# Patient Record
Sex: Male | Born: 1965
Health system: Southern US, Community
[De-identification: ages and names within clinical notes are randomized; demographics above are authoritative.]

## PROBLEM LIST (undated history)

## (undated) DIAGNOSIS — F1010 Alcohol abuse, uncomplicated: Secondary | ICD-10-CM

## (undated) DIAGNOSIS — J9601 Acute respiratory failure with hypoxia: Secondary | ICD-10-CM

## (undated) DIAGNOSIS — I480 Paroxysmal atrial fibrillation: Secondary | ICD-10-CM

## (undated) DIAGNOSIS — M792 Neuralgia and neuritis, unspecified: Secondary | ICD-10-CM

## (undated) DIAGNOSIS — Z98811 Dental restoration status: Secondary | ICD-10-CM

## (undated) DIAGNOSIS — F1190 Opioid use, unspecified, uncomplicated: Secondary | ICD-10-CM

## (undated) DIAGNOSIS — M5136 Other intervertebral disc degeneration, lumbar region: Secondary | ICD-10-CM

## (undated) DIAGNOSIS — F1290 Cannabis use, unspecified, uncomplicated: Secondary | ICD-10-CM

## (undated) DIAGNOSIS — E7404 McArdle disease: Secondary | ICD-10-CM

## (undated) DIAGNOSIS — G7289 Other specified myopathies: Secondary | ICD-10-CM

## (undated) DIAGNOSIS — I712 Thoracic aortic aneurysm, without rupture, unspecified: Secondary | ICD-10-CM

## (undated) DIAGNOSIS — J90 Pleural effusion, not elsewhere classified: Secondary | ICD-10-CM

## (undated) DIAGNOSIS — M6250 Muscle wasting and atrophy, not elsewhere classified, unspecified site: Secondary | ICD-10-CM

## (undated) DIAGNOSIS — Z87442 Personal history of urinary calculi: Secondary | ICD-10-CM

## (undated) DIAGNOSIS — I10 Essential (primary) hypertension: Secondary | ICD-10-CM

## (undated) DIAGNOSIS — M6281 Muscle weakness (generalized): Secondary | ICD-10-CM

## (undated) DIAGNOSIS — M51369 Other intervertebral disc degeneration, lumbar region without mention of lumbar back pain or lower extremity pain: Secondary | ICD-10-CM

## (undated) HISTORY — DX: Acute respiratory failure with hypoxia: J96.01

## (undated) HISTORY — DX: Neuralgia and neuritis, unspecified: M79.2

## (undated) HISTORY — DX: Paroxysmal atrial fibrillation: I48.0

## (undated) HISTORY — DX: Pleural effusion, not elsewhere classified: J90

## (undated) HISTORY — PX: NASAL FRACTURE SURGERY: SHX718

---

## 1998-02-18 ENCOUNTER — Emergency Department (HOSPITAL_COMMUNITY): Admission: EM | Admit: 1998-02-18 | Discharge: 1998-02-18 | Payer: Self-pay | Admitting: Emergency Medicine

## 1999-03-23 ENCOUNTER — Other Ambulatory Visit (HOSPITAL_COMMUNITY): Admission: RE | Admit: 1999-03-23 | Discharge: 1999-03-28 | Payer: Self-pay | Admitting: Psychiatry

## 2001-01-15 ENCOUNTER — Encounter: Payer: Self-pay | Admitting: Emergency Medicine

## 2001-01-15 ENCOUNTER — Emergency Department (HOSPITAL_COMMUNITY): Admission: EM | Admit: 2001-01-15 | Discharge: 2001-01-15 | Payer: Self-pay | Admitting: Emergency Medicine

## 2003-01-27 ENCOUNTER — Encounter: Payer: Self-pay | Admitting: Emergency Medicine

## 2003-01-27 ENCOUNTER — Emergency Department (HOSPITAL_COMMUNITY): Admission: EM | Admit: 2003-01-27 | Discharge: 2003-01-27 | Payer: Self-pay | Admitting: Emergency Medicine

## 2006-05-07 ENCOUNTER — Emergency Department (HOSPITAL_COMMUNITY): Admission: EM | Admit: 2006-05-07 | Discharge: 2006-05-07 | Payer: Self-pay | Admitting: Emergency Medicine

## 2006-05-27 ENCOUNTER — Emergency Department (HOSPITAL_COMMUNITY): Admission: EM | Admit: 2006-05-27 | Discharge: 2006-05-27 | Payer: Self-pay | Admitting: Family Medicine

## 2007-06-10 ENCOUNTER — Ambulatory Visit: Payer: Self-pay | Admitting: Family Medicine

## 2007-06-10 DIAGNOSIS — R945 Abnormal results of liver function studies: Secondary | ICD-10-CM

## 2007-06-10 LAB — CONVERTED CEMR LAB
ALT: 190 units/L — ABNORMAL HIGH (ref 0–53)
AST: 286 units/L — ABNORMAL HIGH (ref 0–37)
Albumin: 4.5 g/dL (ref 3.5–5.2)
Alkaline Phosphatase: 58 units/L (ref 39–117)
Bilirubin, Direct: 0.1 mg/dL (ref 0.0–0.3)
HCV Ab: NEGATIVE
Hep A IgM: NEGATIVE
Hep B C IgM: NEGATIVE
Hepatitis B Surface Ag: NEGATIVE
Total Bilirubin: 0.9 mg/dL (ref 0.3–1.2)
Total Protein: 7.1 g/dL (ref 6.0–8.3)

## 2007-06-12 ENCOUNTER — Telehealth (INDEPENDENT_AMBULATORY_CARE_PROVIDER_SITE_OTHER): Payer: Self-pay | Admitting: *Deleted

## 2007-08-25 ENCOUNTER — Emergency Department (HOSPITAL_COMMUNITY): Admission: EM | Admit: 2007-08-25 | Discharge: 2007-08-25 | Payer: Self-pay | Admitting: Family Medicine

## 2008-09-06 ENCOUNTER — Encounter: Admission: RE | Admit: 2008-09-06 | Discharge: 2008-09-06 | Payer: Self-pay | Admitting: Internal Medicine

## 2008-09-06 IMAGING — CT CT ABDOMEN W/ CM
3 of 5 series · 14 of 32 positions shown, 19 images · IV contrast (30CC OMNI 350 & [ID] OMNI 300)
Comparison: None

CLINICAL DATA: Abnormal liver function tests.

CT ABDOMEN WITH CONTRAST
TECHNIQUE: Multidetector CT imaging of the abdomen was performed
following the standard protocol during bolus administration of
intravenous contrast.
Contrast: 125 ml [0B]

[Series 2: abdomen w/ · axial · 0.78mm/px · z∈[-170,+10]mm · 4 of 61 slices shown, 9 images]
[im 13/61  soft-tissue]
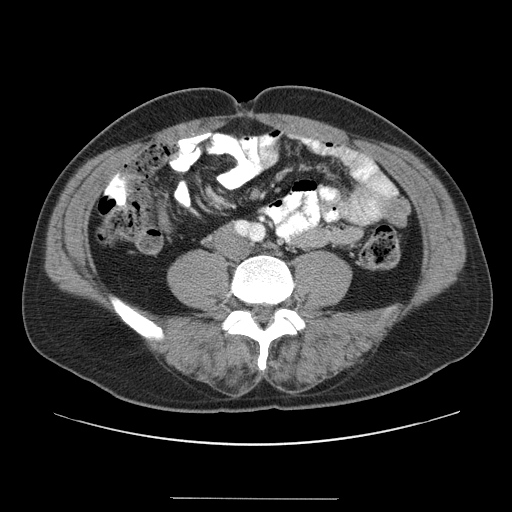
[im 13/61  lung]
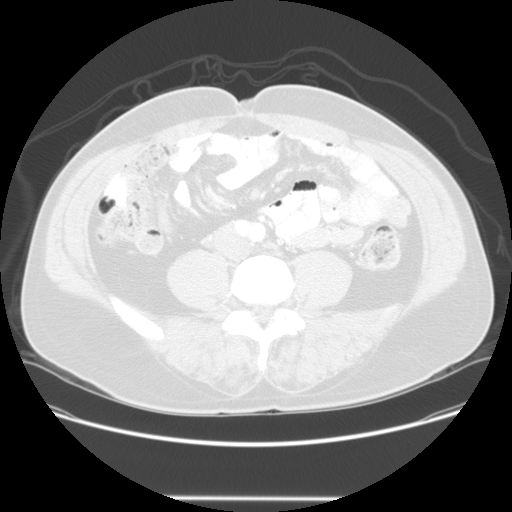
[im 13/61  bone]
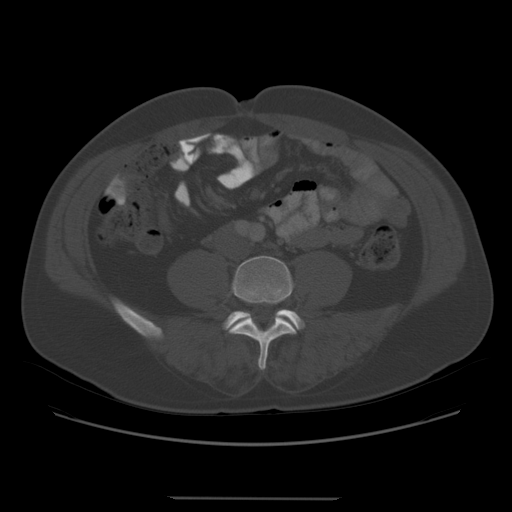
[im 25/61  soft-tissue]
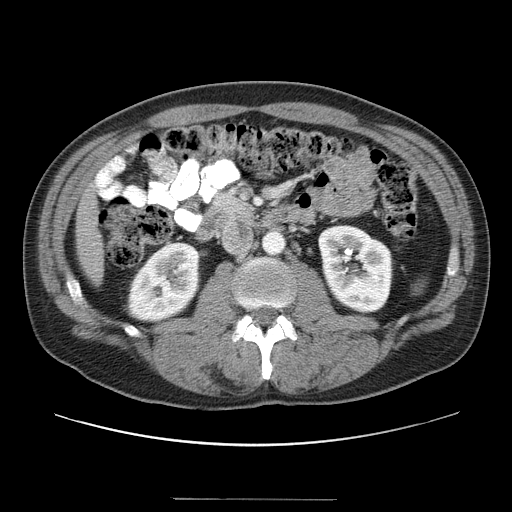
[im 25/61  lung]
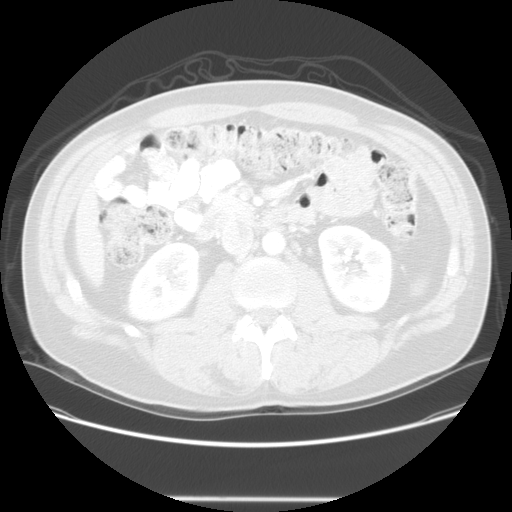
[im 37/61  soft-tissue]
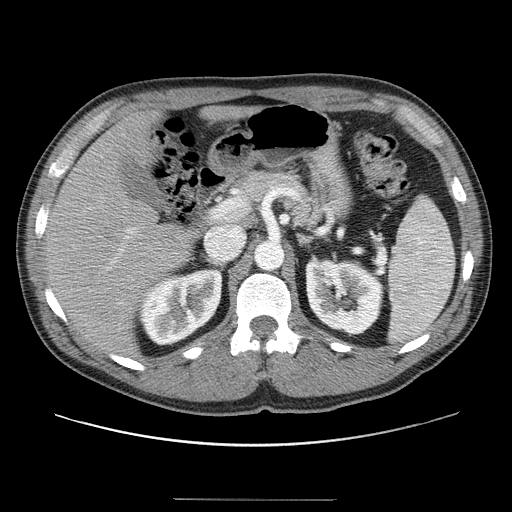
[im 37/61  lung]
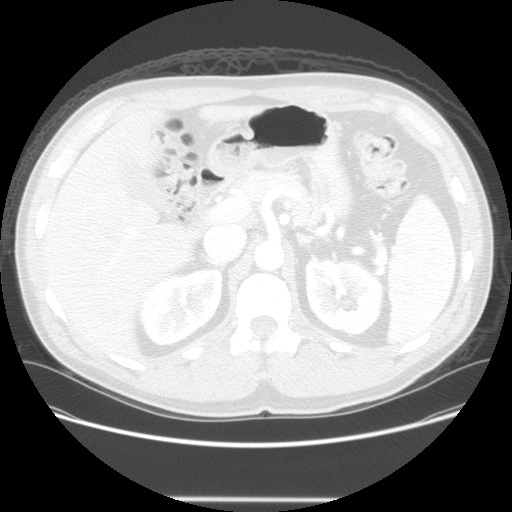
[im 49/61  soft-tissue]
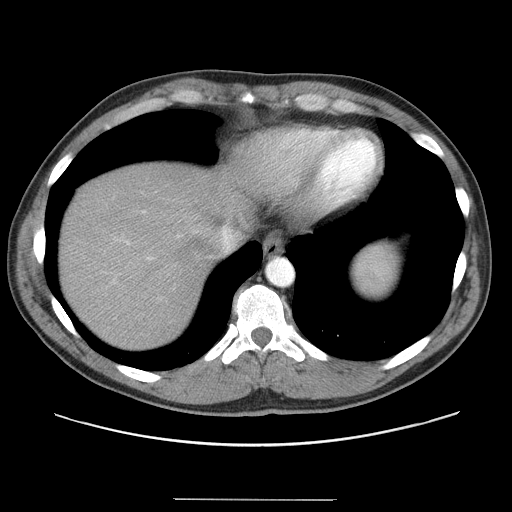
[im 49/61  lung]
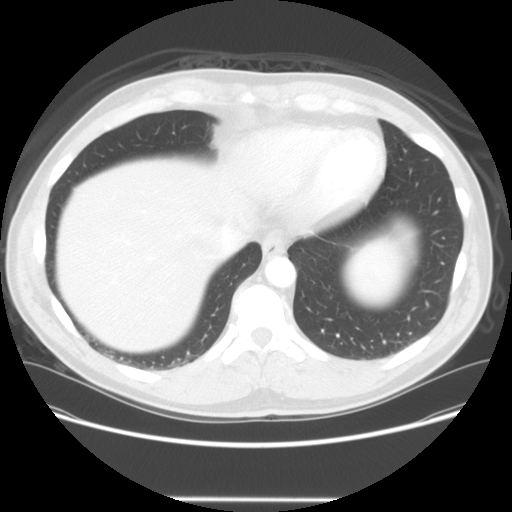

[Series 400: coronal · coronal · 0.78mm/px · 2 of 106 slices shown]
[im 14/106  soft-tissue]
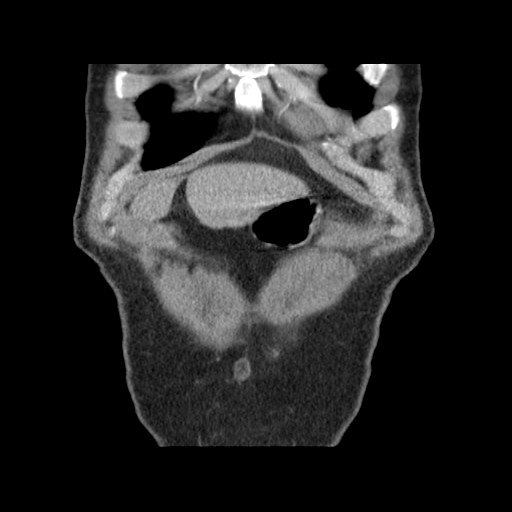
[im 27/106  soft-tissue]
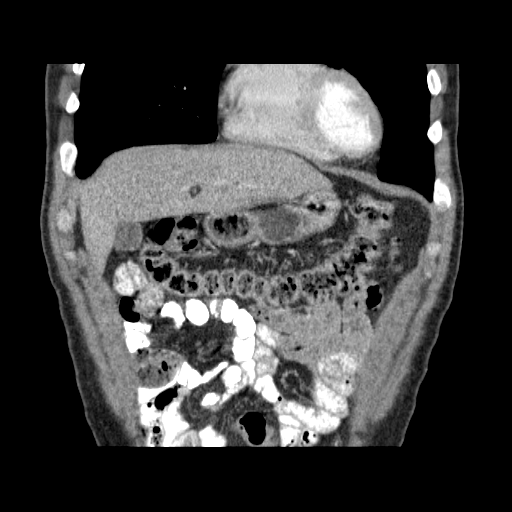

[Series 401: sagittal · sagittal · 0.78mm/px · 8 of 155 slices shown]
[im 13/155  soft-tissue]
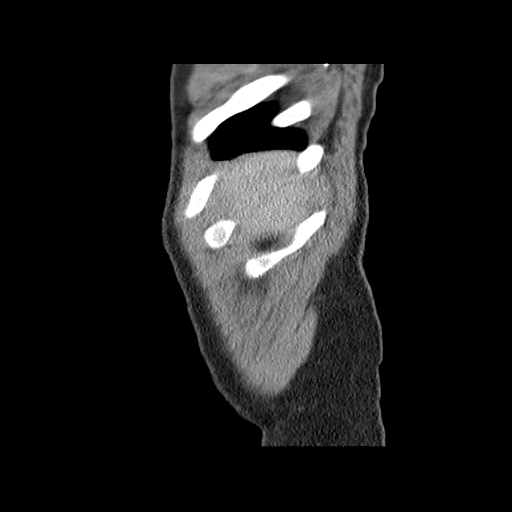
[im 39/155  soft-tissue]
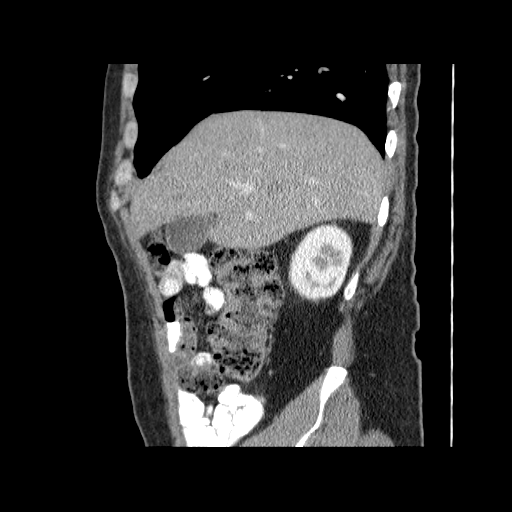
[im 52/155  soft-tissue]
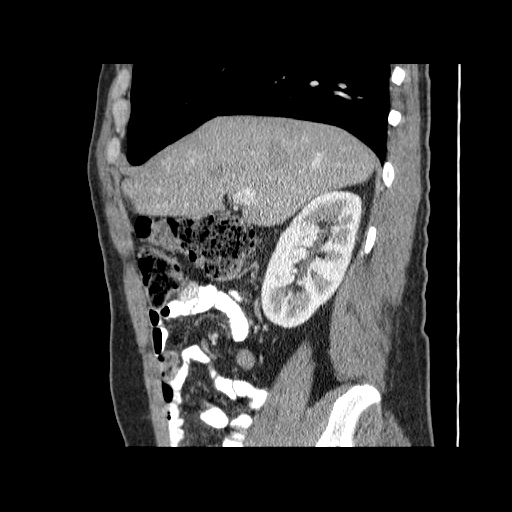
[im 65/155  soft-tissue]
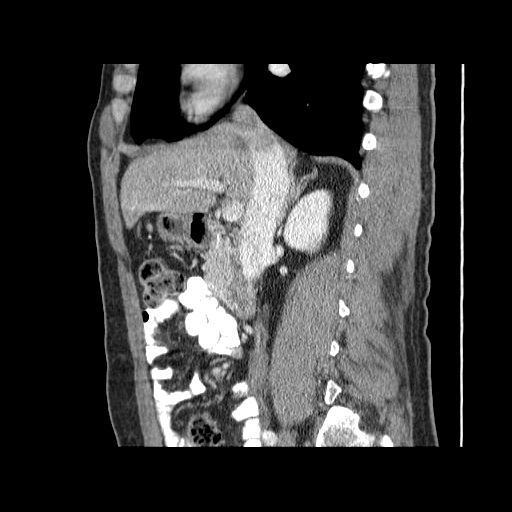
[im 90/155  soft-tissue]
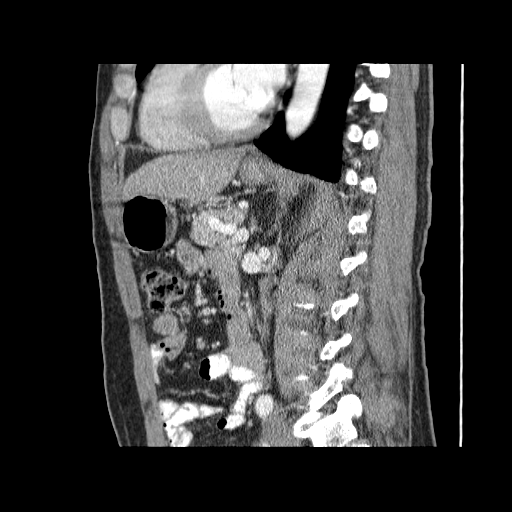
[im 103/155  soft-tissue]
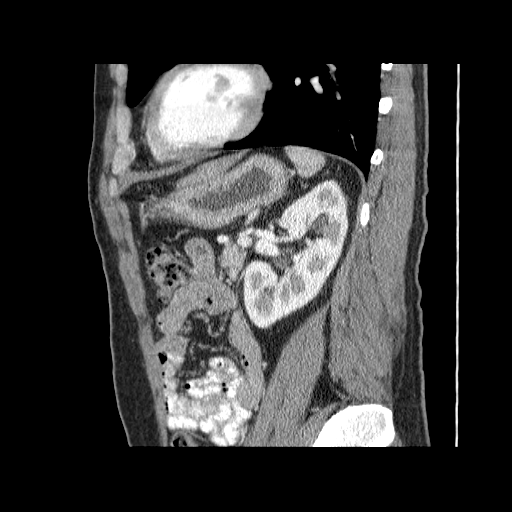
[im 116/155  soft-tissue]
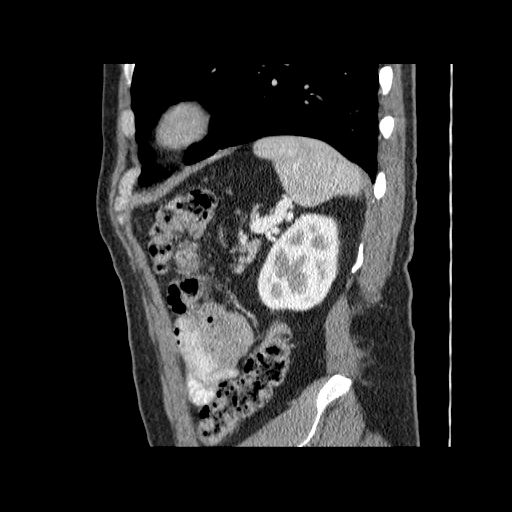
[im 142/155  soft-tissue]
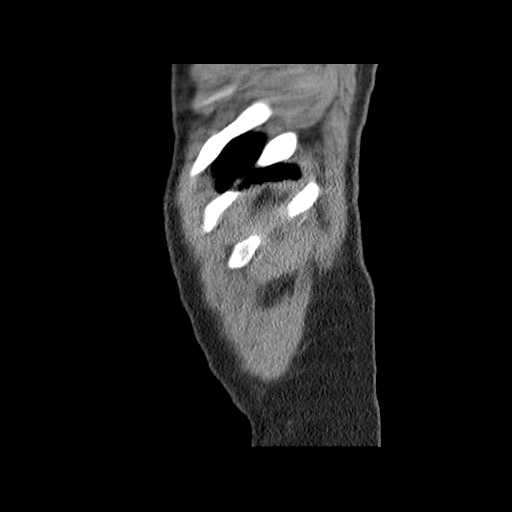

[14 of 32 positions shown; findings below may reference images not displayed]

FINDINGS: Lung bases show minimal dependent atelectasis.  Heart
size normal.  No pericardial or pleural effusion.

Liver, gallbladder, adrenal glands and right kidney are
unremarkable.  A 6 mm stone is seen in the lower pole of the left
kidney.  Spleen, pancreas, stomach and visualized bowel are
unremarkable.  No pathologically enlarged lymph nodes.  No free
fluid.  No worrisome lytic or sclerotic lesions. Advanced
degenerative disc disease is seen at L5-S1.
IMPRESSION: 1.  No acute findings in the abdomen.
2.  Nonobstructing left nephrolithiasis.
3.  Advanced degenerative disc disease at L5-S1.

## 2009-12-27 ENCOUNTER — Emergency Department (HOSPITAL_COMMUNITY): Admission: EM | Admit: 2009-12-27 | Discharge: 2009-12-27 | Payer: Self-pay | Admitting: Family Medicine

## 2011-03-30 LAB — POCT URINALYSIS DIP (DEVICE)
Bilirubin Urine: NEGATIVE
Glucose, UA: NEGATIVE
Hgb urine dipstick: NEGATIVE
Ketones, ur: NEGATIVE
Nitrite: NEGATIVE
Operator id: 116391
Protein, ur: NEGATIVE
Specific Gravity, Urine: 1.015
Urobilinogen, UA: 0.2
pH: 7

## 2011-09-24 ENCOUNTER — Other Ambulatory Visit (HOSPITAL_COMMUNITY): Payer: Self-pay | Admitting: Internal Medicine

## 2011-09-24 ENCOUNTER — Ambulatory Visit (HOSPITAL_COMMUNITY)
Admission: RE | Admit: 2011-09-24 | Discharge: 2011-09-24 | Disposition: A | Discharge status: Home / Self Care | Payer: 59 | Source: Ambulatory Visit | Attending: Internal Medicine | Admitting: Internal Medicine

## 2011-09-24 DIAGNOSIS — M546 Pain in thoracic spine: Secondary | ICD-10-CM | POA: Insufficient documentation

## 2011-09-24 DIAGNOSIS — R52 Pain, unspecified: Secondary | ICD-10-CM | POA: Insufficient documentation

## 2011-09-24 IMAGING — CR DG SCAPULA*R*
2 series · 2 of 2 positions shown · non-contrast
Comparison: None.

CLINICAL DATA: Right upper back pain.

RIGHT SCAPULA - 2+ VIEWS

[w scapula lat right (1 of 2)]
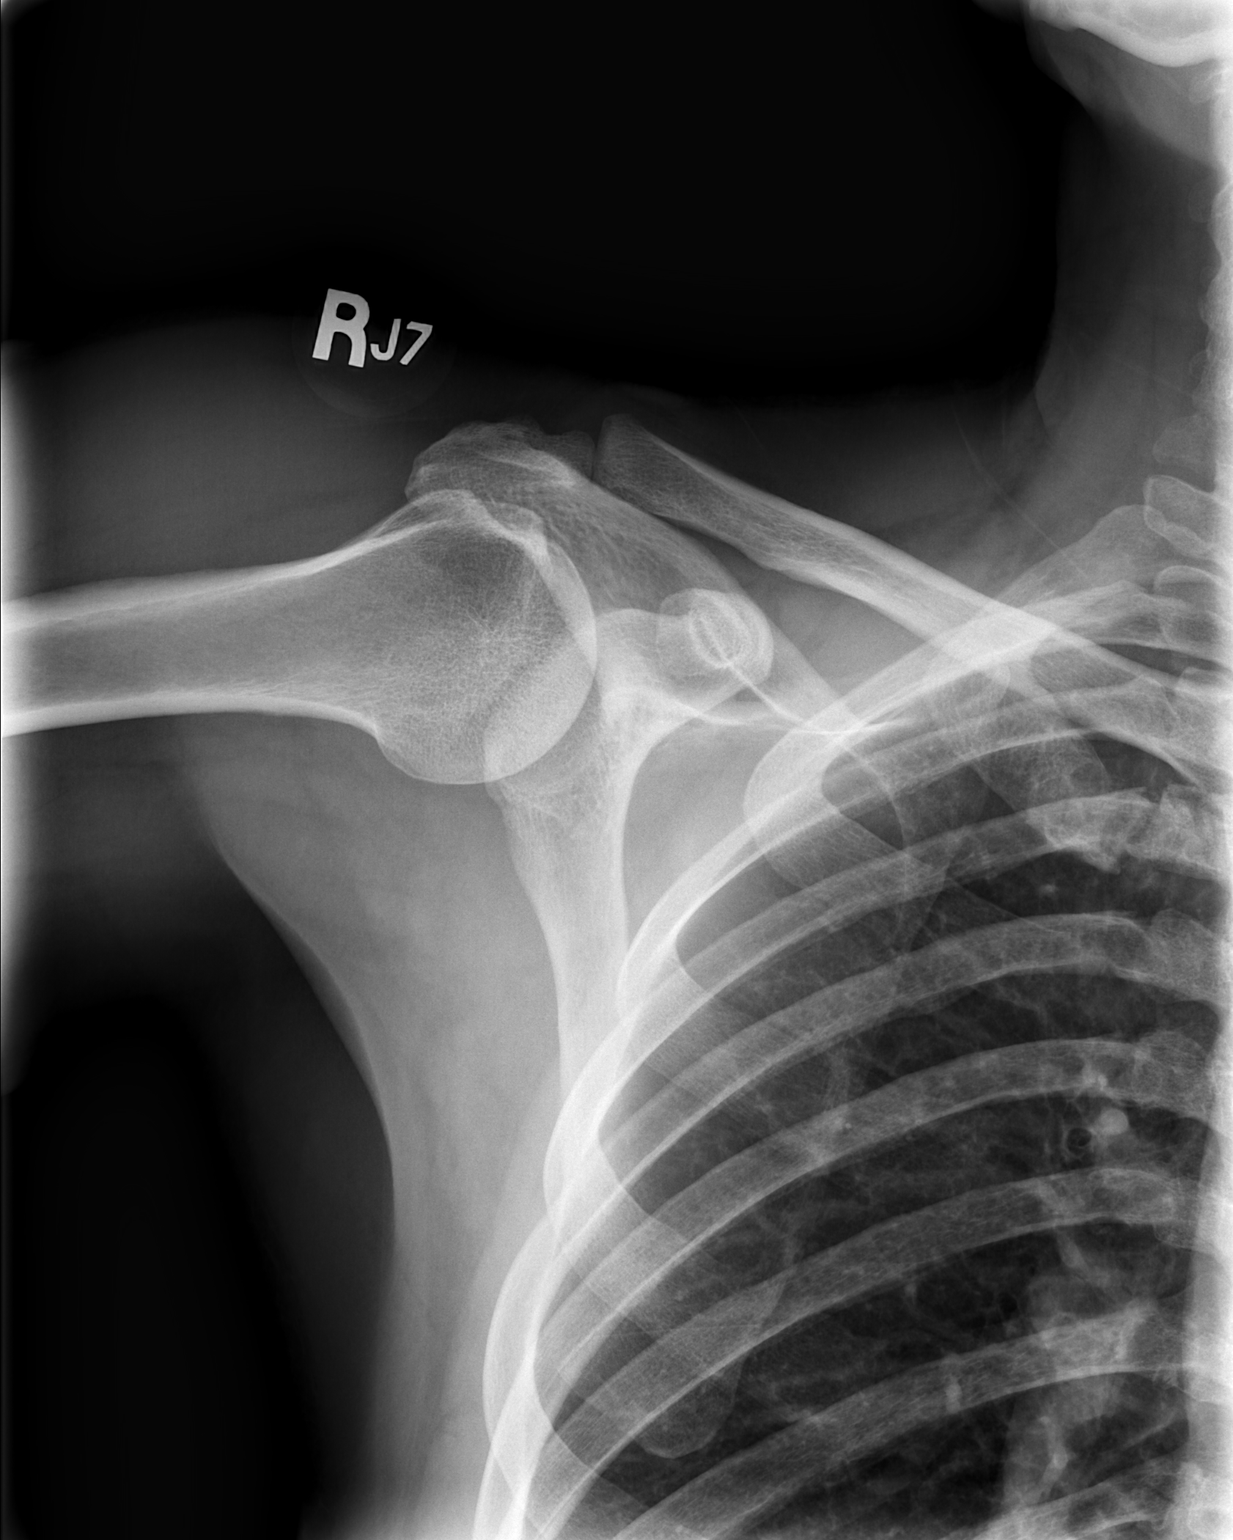

[w scapula lat right (2 of 2)]
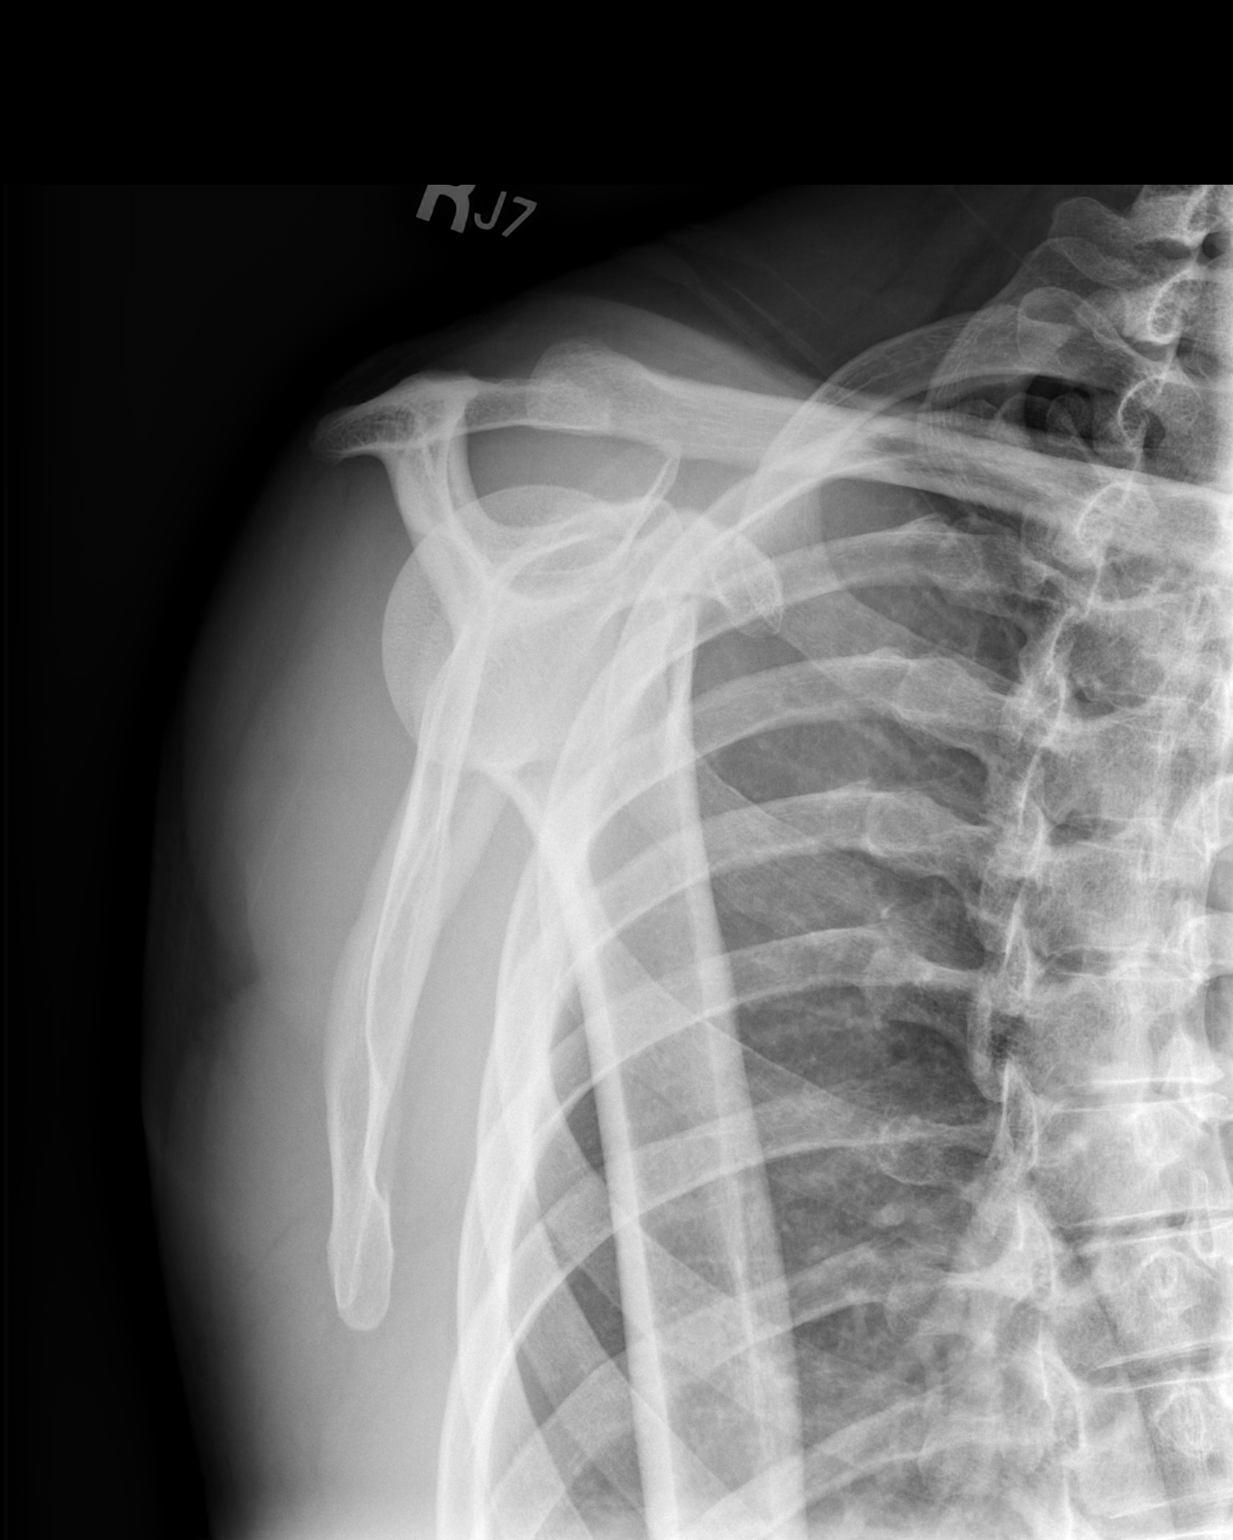

[2 of 2 positions shown; findings below may reference images not displayed]

FINDINGS: Two views of the right scapula show no acute findings.
Visualized portion of the right chest is unremarkable.
IMPRESSION: No acute findings.

## 2011-12-06 ENCOUNTER — Emergency Department (HOSPITAL_COMMUNITY)
Admission: EM | Admit: 2011-12-06 | Discharge: 2011-12-06 | Disposition: A | Discharge status: Home / Self Care | Payer: 59 | Source: Home / Self Care | Attending: Emergency Medicine | Admitting: Emergency Medicine

## 2011-12-06 ENCOUNTER — Encounter (HOSPITAL_COMMUNITY): Payer: Self-pay | Admitting: Cardiology

## 2011-12-06 ENCOUNTER — Emergency Department (INDEPENDENT_AMBULATORY_CARE_PROVIDER_SITE_OTHER): Payer: 59

## 2011-12-06 DIAGNOSIS — M5430 Sciatica, unspecified side: Secondary | ICD-10-CM

## 2011-12-06 DIAGNOSIS — S300XXA Contusion of lower back and pelvis, initial encounter: Secondary | ICD-10-CM

## 2011-12-06 DIAGNOSIS — M5431 Sciatica, right side: Secondary | ICD-10-CM

## 2011-12-06 IMAGING — CR DG LUMBAR SPINE COMPLETE 4+V
5 series · 5 of 5 positions shown · non-contrast
Comparison: [DATE] CT

CLINICAL DATA: Fall, back pain

LUMBAR SPINE - COMPLETE 4+ VIEW

[view not recorded (1 of 5)]
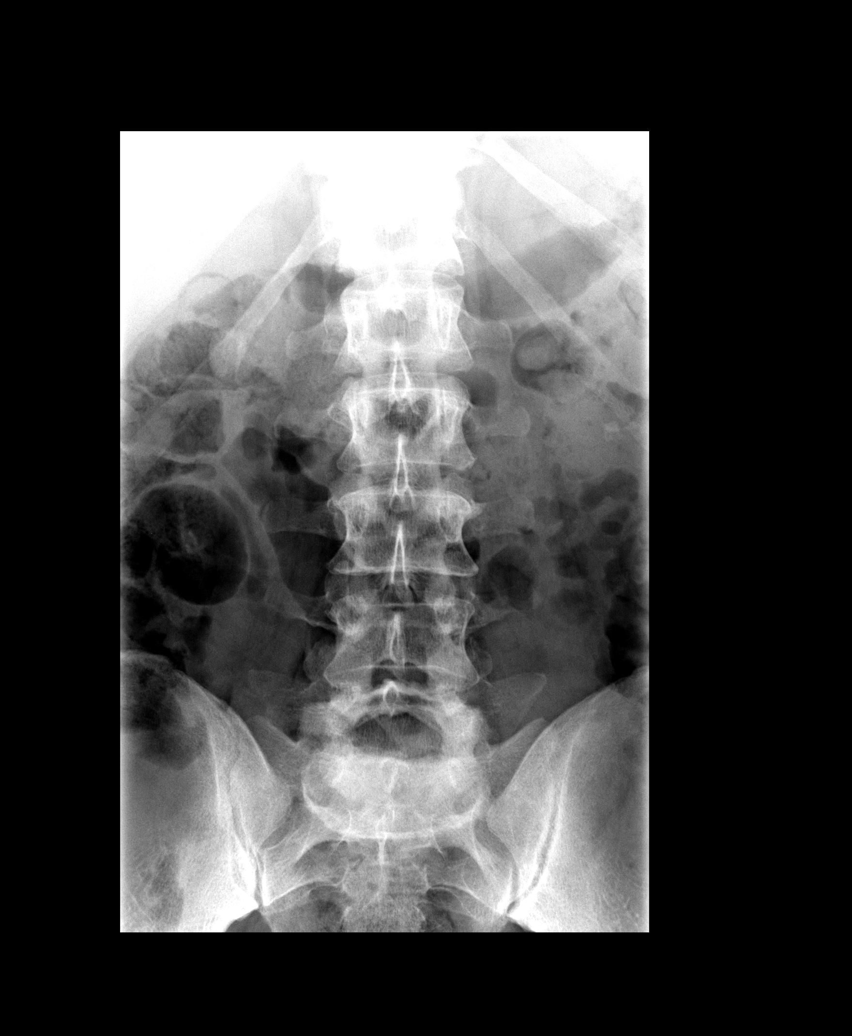

[view not recorded (2 of 5)]
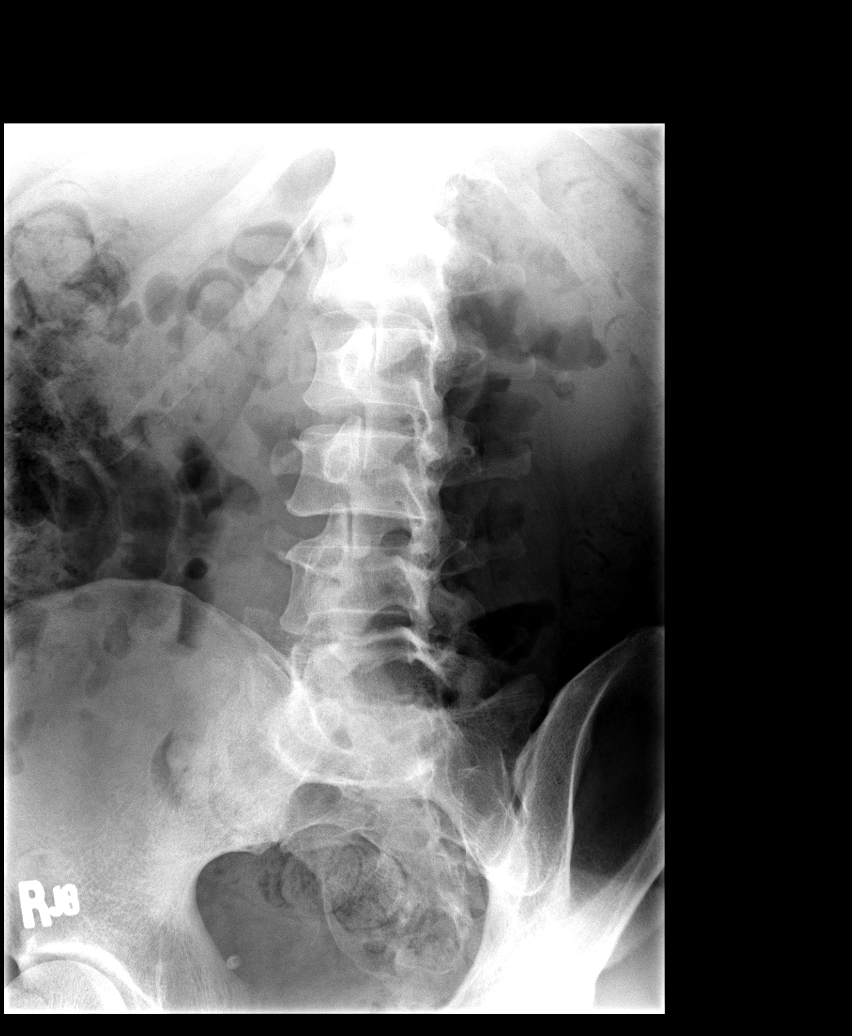

[view not recorded (3 of 5)]
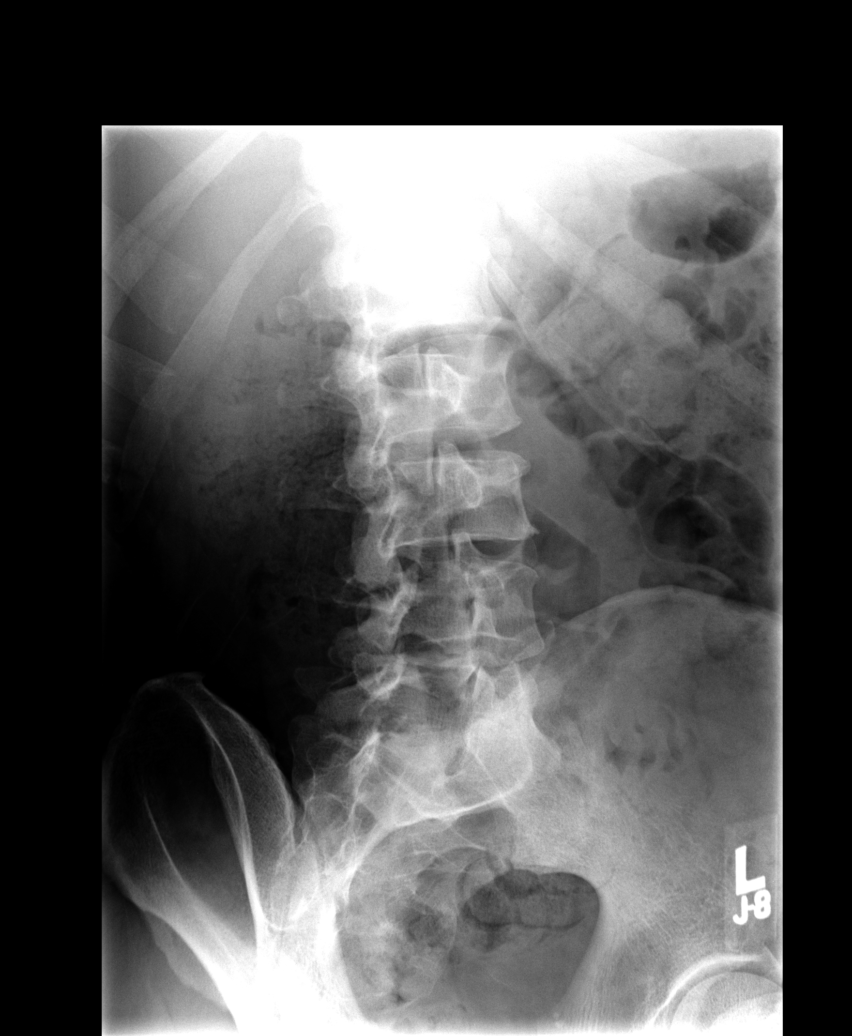

[view not recorded (4 of 5)]
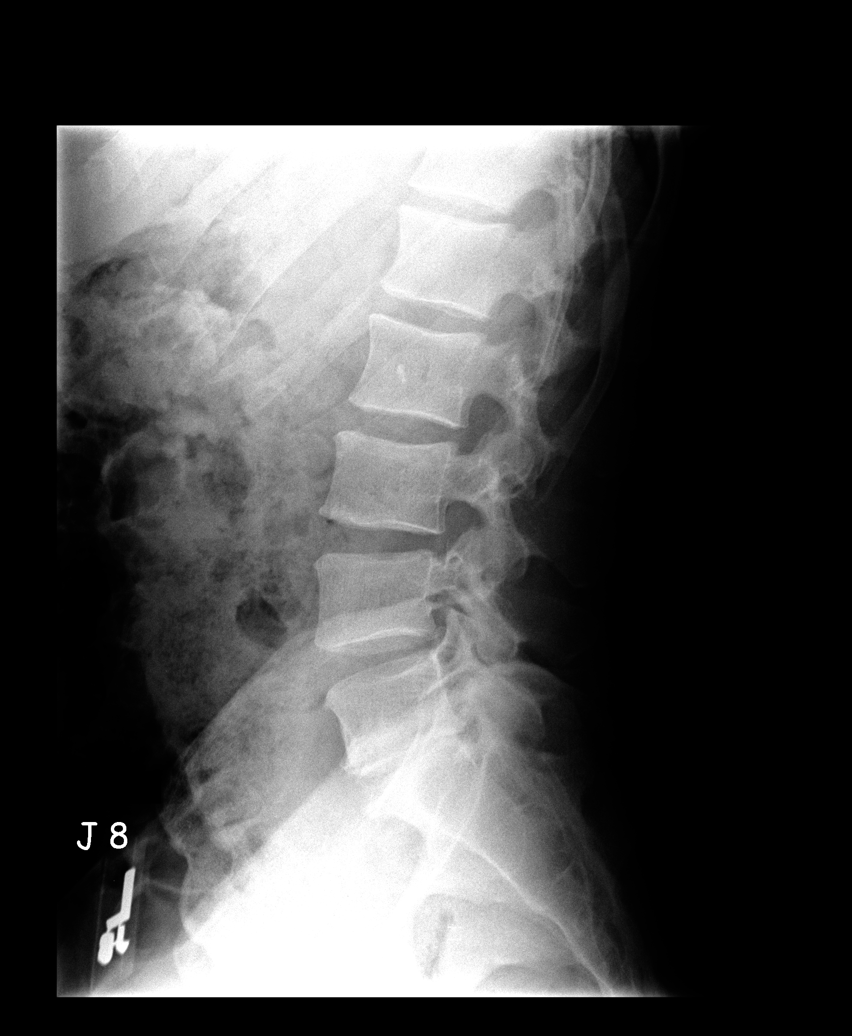

[view not recorded (5 of 5)]
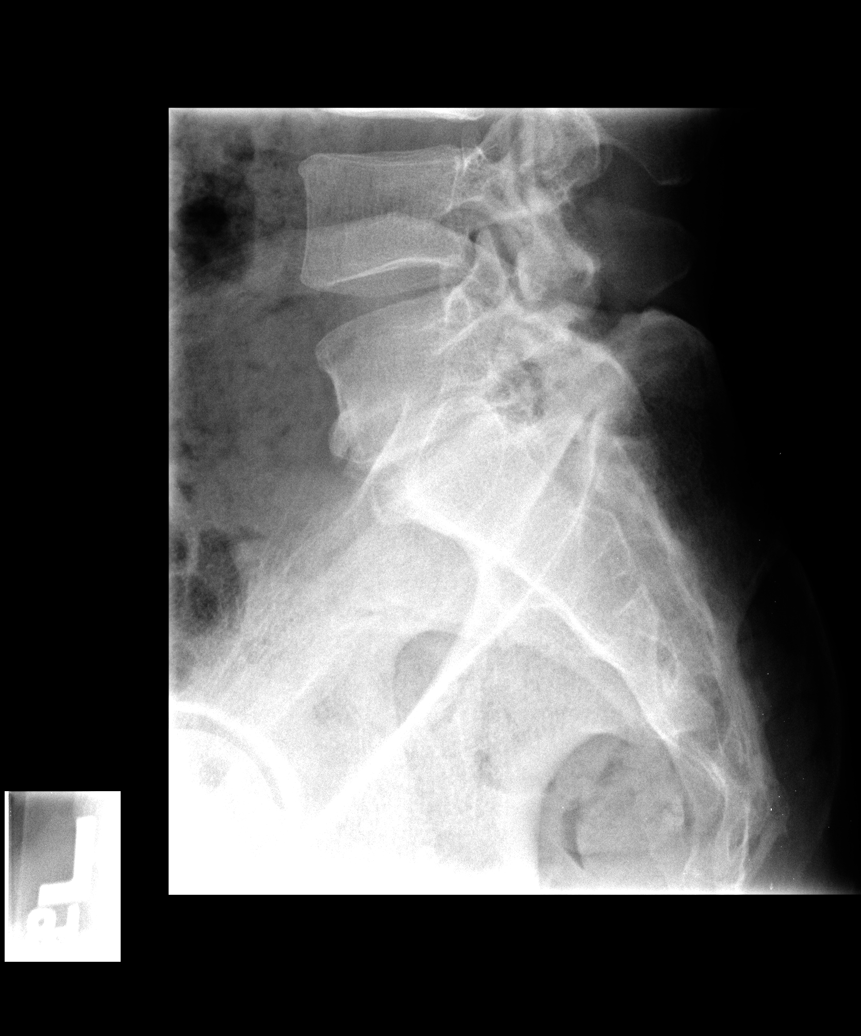

[5 of 5 positions shown; findings below may reference images not displayed]

FINDINGS: Normal lumbar spine alignment.  No compression fracture.
L5 S1 degenerative disc disease and spondylosis evident with disc
space narrowing, sclerosis and bony spurring.  Preserved vertebral
body heights.  No pars defects.  Left lower pole renal calculus
noted measuring 6 mm.
IMPRESSION: No acute finding.

L5 S1 degenerative disc disease and spondylosis

Left inferior pole nephrolithiasis

## 2011-12-06 MED ORDER — PREDNISONE 20 MG PO TABS
ORAL_TABLET | ORAL | Status: AC
  Filled 2011-12-06: qty 3

## 2011-12-06 MED ORDER — KETOROLAC TROMETHAMINE 30 MG/ML IJ SOLN
60.0000 mg | Freq: Once | INTRAMUSCULAR | Status: AC
  Administered 2011-12-06: 60 mg via INTRAMUSCULAR

## 2011-12-06 MED ORDER — OXYCODONE-ACETAMINOPHEN 5-325 MG PO TABS: 1.0000 | ORAL_TABLET | Freq: Four times a day (QID) | ORAL | Status: AC | PRN

## 2011-12-06 MED ORDER — KETOROLAC TROMETHAMINE 10 MG PO TABS: 10.0000 mg | ORAL_TABLET | Freq: Four times a day (QID) | ORAL | Status: AC | PRN

## 2011-12-06 MED ORDER — PREDNISONE 20 MG PO TABS: ORAL_TABLET | ORAL | Status: AC

## 2011-12-06 MED ORDER — KETOROLAC TROMETHAMINE 60 MG/2ML IM SOLN
INTRAMUSCULAR | Status: AC
  Filled 2011-12-06: qty 2

## 2011-12-06 MED ORDER — PREDNISONE 20 MG PO TABS
60.0000 mg | ORAL_TABLET | Freq: Once | ORAL | Status: AC
  Administered 2011-12-06: 60 mg via ORAL

## 2011-12-06 NOTE — ED Provider Notes (Signed)
History     CSN: [id number]  Arrival date & time 12/06/11  1229   First MD Initiated Contact with Patient 12/06/11 1256      Chief Complaint  Patient presents with  . Back Pain    (Consider location/radiation/quality/duration/timing/severity/associated sxs/prior treatment) HPI Comments: Patient states that he was walking backwards while carrying something, lost his balance, fell backwards, hitting the right SI joint on the bumper of the truck. Now reports pain, tingling, burning sensation from the area, extending to the back of his leg to the bottom of his foot. Pain is worse with walking, movement. No alleviating factors. He  Has been taking 1 g of Tylenol 4 times/day without relief. No history of injury to his back, pelvis, or leg. No history of prolonged steroid use, IVDU, cancer, osteoporosis.  Patient is a 46 y.o. male presenting with back pain and leg pain. The history is provided by the patient. No language interpreter was used.  Back Pain  Associated symptoms include leg pain, paresthesias and tingling. Pertinent negatives include no fever, no numbness, no abdominal pain, no abdominal swelling, no bowel incontinence, no bladder incontinence, no dysuria, no pelvic pain, no paresis and no weakness.  Leg Pain  The incident occurred yesterday. The injury mechanism was a fall. The pain is present in the right hip. The quality of the pain is described as burning and tingling. The pain has been constant since onset. Associated symptoms include tingling. Pertinent negatives include no numbness, no inability to bear weight, no loss of motion, no muscle weakness and no loss of sensation. He reports no foreign bodies present. The symptoms are aggravated by palpation. He has tried acetaminophen for the symptoms. The treatment provided no relief.    Past Medical History  Diagnosis Date  . Kidney stones     Past Surgical History  Procedure Date  . Rhinoplasty     History reviewed. No  pertinent family history.  History  Substance Use Topics  . Smoking status: Never Smoker   . Smokeless tobacco: Not on file  . Alcohol Use: No      Review of Systems  Constitutional: Negative for fever.  Gastrointestinal: Negative for abdominal pain and bowel incontinence.  Genitourinary: Negative for bladder incontinence, dysuria and pelvic pain.  Musculoskeletal: Positive for back pain.  Neurological: Positive for tingling and paresthesias. Negative for weakness and numbness.    Allergies  Vicodin  Home Medications   Current Outpatient Rx  Name Route Sig Dispense Refill  . KETOROLAC TROMETHAMINE 10 MG PO TABS Oral Take 1 tablet (10 mg total) by mouth 4 (four) times daily as needed for pain. 20 tablet 0  . OXYCODONE-ACETAMINOPHEN 5-325 MG PO TABS Oral Take 1-2 tablets by mouth every 6 (six) hours as needed for pain. 20 tablet 0  . PREDNISONE 20 MG PO TABS  Take 3 tabs po on first day, 2 tabs second day, 2 tabs third day, 1 tab fourth day, 1 tab 5th day. Take with food. 9 tablet 0    BP 122/74  Pulse 92  Temp(Src) 97.8 F (36.6 C) (Oral)  Resp 18  SpO2 98%  Physical Exam  Nursing note and vitals reviewed. Constitutional: He is oriented to person, place, and time. He appears well-developed and well-nourished.  HENT:  Head: Normocephalic and atraumatic.  Eyes: Conjunctivae and EOM are normal.  Neck: Normal range of motion.  Cardiovascular: Normal rate.   Pulmonary/Chest: Effort normal. No respiratory distress.  Abdominal: He exhibits no distension.  Musculoskeletal:  Normal range of motion.       Back:       Tenderness see drawing. Bilateral lower extremities nontender without new rashes or color change, intact DP pulses, pain with active R hip extension, SLR R side. Pain with PROM hips b/l.  Sensation baseline light touch bilaterally for Pt, DTR's symmetric and intact bilaterally KJ, acilles. Motor symmetric bilateral 5/5 hip flexion, quadriceps, hamstrings, EHL, foot  dorsiflexion, foot plantarflexion, gait somewhat antalgic but without apparent new ataxia.   Neurological: He is alert and oriented to person, place, and time.  Skin: Skin is warm and dry.  Psychiatric: He has a normal mood and affect. His behavior is normal.    ED Course  Procedures (including critical care time)  Labs Reviewed - No data to display Dg Lumbar Spine Complete  12/06/2011  *RADIOLOGY REPORT*  Clinical Data: Fall, back pain  LUMBAR SPINE - COMPLETE 4+ VIEW  Comparison: 09/06/2008 CT  Findings: Normal lumbar spine alignment.  No compression fracture. L5 S1 degenerative disc disease and spondylosis evident with disc space narrowing, sclerosis and bony spurring.  Preserved vertebral body heights.  No pars defects.  Left lower pole renal calculus noted measuring 6 mm.  IMPRESSION: No acute finding.  L5 S1 degenerative disc disease and spondylosis  Left inferior pole nephrolithiasis  Original Report Authenticated By: Judie Petit. Ruel Favors, M.D.     1. Sciatica, right   2. Contusion of sacrum      MDM   given trauma with bony tenderness will check L-spine.  Pt describing pain in L5/S1 distribution but no focal neuro deficits on exam. Giving 60 mg prednisone po and 60 mg toradol im here. He is driving home. Advised patient that he has already taken the maximum amount of Tylenol allowed in the past 24 hours, and he cannot take any more Tylenol until tomorrow. Currently no abdominal pain, nausea, vomiting.  Imaging reviewed by myself. Report per radiologist. Discussed results with patient. Does have a history of kidney stones. He is not reporting any pain on his left side at this time. He has no urinary complaints. Discussed signs and symptoms that would prompt his return to the ER. Patient agrees with plan.    Luiz Blare, MD 12/06/11 1540

## 2011-12-06 NOTE — Discharge Instructions (Signed)
Take the medication as written. Take 1 gram of tylenol with the motrin up to 4 times a day as needed for pain and fever. This with the toradol is an effective combination for pain. Take the percocet only for severe pain. Do not take the tylenol and percocet as they both have tylenol in them and too much can hurt your liver. Do not exceed 4 grams of tylenol a day from all sources. Do not take any more Tylenol until tomorrow. Return if you get worse, have a  fever >100.4, or for any concerns.   Go to www.goodrx.com to look up your medications. This will give you a list of where you can find your prescriptions at the most affordable prices.

## 2011-12-06 NOTE — ED Notes (Signed)
Larey Seat out of the back of work truck yesterday afternoon hitting buttock on bumper now having right lower back pain shooting down posterior right leg like electrical shock type pain. Pain in buttocks at belt line is burning and continuous. Toes feel tingly and this feeling is new. No loss of bowel or bladder control.

## 2012-01-20 ENCOUNTER — Ambulatory Visit (INDEPENDENT_AMBULATORY_CARE_PROVIDER_SITE_OTHER): Payer: 59 | Admitting: Family Medicine

## 2012-01-20 VITALS — BP 130/80 | HR 72 | Temp 98.0°F | Resp 16 | Ht 70.0 in | Wt 193.0 lb

## 2012-01-20 DIAGNOSIS — M549 Dorsalgia, unspecified: Secondary | ICD-10-CM

## 2012-01-20 MED ORDER — OXYCODONE-ACETAMINOPHEN 5-325 MG PO TABS: 1.0000 | ORAL_TABLET | Freq: Three times a day (TID) | ORAL | Status: AC | PRN

## 2012-01-20 MED ORDER — PREDNISONE 20 MG PO TABS: ORAL_TABLET | ORAL | Status: AC

## 2012-01-20 NOTE — Progress Notes (Signed)
   Date:  01/20/2012   Name:  Joseph Ayala   DOB:  1966-03-27   MRN:  [id number]  PCP:  Enrique Sack, MD    Chief Complaint: Back Injury   History of Present Illness:  Joseph Ayala is a 46 y.o. very pleasant male patient who presents with the following:  Here today with complaint of back pain.  He was doing some work under his house removing some piping.  He fell and landed on his left hip while carrying some metal piping.  He used tylenol yesterday, but continued to have some severe pain this am.  It travels down his left leg.    He had a similar problem about 2 months ago- he got better with percocet and did not have to have any imaging.  He is able to tolerate percocet without problems  He does not note numbness or weakness in his left leg.  No incontinence.  He plans to see his PCP tomorrow  He is generally healthy.   Patient Active Problem List  Diagnosis  . LIVER FUNCTION TESTS, ABNORMAL    Past Medical History  Diagnosis Date  . Kidney stones     Past Surgical History  Procedure Date  . Rhinoplasty     History  Substance Use Topics  . Smoking status: Never Smoker   . Smokeless tobacco: Not on file  . Alcohol Use: No    No family history on file.  Allergies  Allergen Reactions  . Vicodin (Hydrocodone-Acetaminophen) Nausea And Vomiting    Medication list has been reviewed and updated.  No current outpatient prescriptions on file prior to visit.    Review of Systems:  As per HPI- otherwise negative.   Physical Examination: Filed Vitals:   01/20/12 0959  BP: 130/80  Pulse: 72  Temp: 98 F (36.7 C)  Resp: 16   Filed Vitals:   01/20/12 0959  Height: 5\' 10"  (1.778 m)  Weight: 193 lb (87.544 kg)   Body mass index is 27.69 kg/(m^2). Ideal Body Weight: Weight in (lb) to have BMI = 25: 173.9   GEN: WDWN, NAD, Non-toxic, A & O x 3, looks uncomfortable HEENT: Atraumatic, Normocephalic. Neck supple. No masses, No LAD. Ears and Nose: No  external deformity. CV: RRR, No M/G/R. No JVD. No thrill. No extra heart sounds. PULM: CTA B, no wheezes, crackles, rhonchi. No retractions. No resp. distress. No accessory muscle use EXTR: No c/c/e NEURO Normal gait.  PSYCH: Normally interactive. Conversant. Not depressed or anxious appearing.  Calm demeanor.  Back: left lower back with spasm and pain near sciatic notch/ SI joint.  Positive left STR.  No numbness, but weakness/ poor effort left leg.  DTR present and equal bilaterally.   Declined x-rays today.  He does not feel that there is any possibility of a fracture and plans to see his PCP tomorrow Assessment and Plan: 1. Back pain  predniSONE (DELTASONE) 20 MG tablet, oxyCODONE-acetaminophen (ROXICET) 5-325 MG per tablet   Treat for acute back strain/ possible disc bulge as above.  He will see his PCP tomorrow per his report.  Discussed red flags/ signs of CES.  He will follow- up as needed.    Abbe Amsterdam, MD

## 2012-01-31 ENCOUNTER — Emergency Department (HOSPITAL_COMMUNITY)
Admission: EM | Admit: 2012-01-31 | Discharge: 2012-01-31 | Disposition: A | Discharge status: Home / Self Care | Payer: 59 | Source: Home / Self Care | Attending: Emergency Medicine | Admitting: Emergency Medicine

## 2012-01-31 ENCOUNTER — Encounter (HOSPITAL_COMMUNITY): Payer: Self-pay

## 2012-01-31 DIAGNOSIS — M549 Dorsalgia, unspecified: Secondary | ICD-10-CM

## 2012-01-31 LAB — POCT URINALYSIS DIP (DEVICE)
Bilirubin Urine: NEGATIVE
Glucose, UA: NEGATIVE mg/dL
Ketones, ur: NEGATIVE mg/dL
Leukocytes, UA: NEGATIVE
Nitrite: NEGATIVE
pH: 6 (ref 5.0–8.0)

## 2012-01-31 MED ORDER — CYCLOBENZAPRINE HCL 10 MG PO TABS: 10.0000 mg | ORAL_TABLET | Freq: Two times a day (BID) | ORAL | Status: AC | PRN

## 2012-01-31 MED ORDER — KETOROLAC TROMETHAMINE 10 MG PO TABS: 10.0000 mg | ORAL_TABLET | Freq: Four times a day (QID) | ORAL | Status: AC | PRN

## 2012-01-31 MED ORDER — KETOROLAC TROMETHAMINE 30 MG/ML IJ SOLN
INTRAMUSCULAR | Status: AC
  Filled 2012-01-31: qty 1

## 2012-01-31 MED ORDER — KETOROLAC TROMETHAMINE 30 MG/ML IJ SOLN
30.0000 mg | Freq: Once | INTRAMUSCULAR | Status: AC
  Administered 2012-01-31: 30 mg via INTRAMUSCULAR

## 2012-01-31 NOTE — ED Provider Notes (Signed)
History     CSN: [id number]  Arrival date & time 01/31/12  1407   First MD Initiated Contact with Patient 01/31/12 1414      Chief Complaint  Patient presents with  . Back Pain    (Consider location/radiation/quality/duration/timing/severity/associated sxs/prior treatment) HPI Comments: Patient presents to urgent care this afternoon complaining of severe left lower lumbar pain. This started yesterday. Pain is sharp and located to mainly the left side of his lower back. Movements makes it much worse. Feels more comfortable standing than sitting. Patient denies any recent injuries or falls and during my interview describe the ill looking he can think of is that he was thrown some softballs while playing with his daughter yesterday. Patient also describes that he thinks this could also be a kidney stone as he was told he has kidney stones in the past. Further inquiry patient denies any urinary discomfort, pressure burning, or changes in urine color. No nausea vomiting or abdominal pain. Additional denies any numbness, tingling sensations or weakness of his lower extremities. Patient denies any other constitutional symptoms such as fevers, unintentional weight loss, fatigue, myalgias or arthralgias.  Patient is a 46 y.o. male presenting with back pain. The history is provided by the patient.  Back Pain  This is a new problem. The current episode started 12 to 24 hours ago. The problem occurs constantly. The problem has been gradually worsening. Associated with: Patient only relates that he was throwing some softballs to his daughter yesterday, but denies any injuries or falls. of interest during nursing assessment patient described that he attributed the pain to stretching some carpets yesterday. The pain is present in the lumbar spine. The quality of the pain is described as shooting and stabbing. The pain is at a severity of 7/10. The pain is moderate. The symptoms are aggravated by bending,  twisting and certain positions. Stiffness is present all day. Pertinent negatives include no fever, no numbness, no weight loss, no bowel incontinence, no dysuria, no pelvic pain, no paresthesias, no paresis, no tingling and no weakness. He has tried NSAIDs for the symptoms. The treatment provided no relief.    Past Medical History  Diagnosis Date  . Kidney stones   . Kidney stone   . Back injury     Past Surgical History  Procedure Date  . Rhinoplasty     No family history on file.  History  Substance Use Topics  . Smoking status: Never Smoker   . Smokeless tobacco: Not on file  . Alcohol Use: No      Review of Systems  Constitutional: Positive for activity change. Negative for fever, chills, weight loss, diaphoresis, appetite change and fatigue.  Gastrointestinal: Negative for bowel incontinence.  Genitourinary: Negative for dysuria, urgency, flank pain, decreased urine volume, discharge, penile swelling, difficulty urinating, penile pain, testicular pain and pelvic pain.  Musculoskeletal: Positive for back pain. Negative for myalgias, joint swelling and arthralgias.  Neurological: Negative for tingling, weakness, numbness and paresthesias.    Allergies  Vicodin  Home Medications   Current Outpatient Rx  Name Route Sig Dispense Refill  . ACETAMINOPHEN 500 MG PO TABS Oral Take 500 mg by mouth every 6 (six) hours as needed.    . CYCLOBENZAPRINE HCL 10 MG PO TABS Oral Take 1 tablet (10 mg total) by mouth 2 (two) times daily as needed for muscle spasms. 15 tablet 0  . KETOROLAC TROMETHAMINE 10 MG PO TABS Oral Take 1 tablet (10 mg total) by mouth every 6 (  six) hours as needed for pain. 20 tablet 0  . OXYCODONE-ACETAMINOPHEN 5-325 MG PO TABS Oral Take 1 tablet by mouth every 8 (eight) hours as needed for pain. 15 tablet 0  . PREDNISONE 20 MG PO TABS  Take 3 daily for 2 days, then then 2 daily for 3 days, then 1 daily for 5 days 17 tablet 0    BP 131/86  Pulse 76  Temp  97.8 F (36.6 C) (Oral)  Resp 20  SpO2 98%  Physical Exam  Nursing note and vitals reviewed. Constitutional: Vital signs are normal.  Non-toxic appearance. He does not have a sickly appearance. He does not appear ill. He appears distressed.  Eyes: Conjunctivae are normal.  Neck: Neck supple.  Musculoskeletal: He exhibits tenderness. He exhibits no edema.       Lumbar back: He exhibits tenderness and pain. He exhibits no bony tenderness, no swelling, no edema, no deformity and no laceration.       Back:  Neurological: He is alert.  Skin: No rash noted. No erythema.    ED Course  Procedures (including critical care time)  Labs Reviewed  POCT URINALYSIS DIP (DEVICE) - Abnormal; Notable for the following:    Hgb urine dipstick TRACE (*)     All other components within normal limits   No results found.   1. Acute back pain       MDM   Patient presents urgent care with a sudden onset of left paraspinal lumbar pain. Patient denies any urinary symptoms or abdominal pain. Exam was consistent with paravertebral tenderness with minimal movement and direct palpation. Patient is somewhat hesitant and very inquisitive about and NSAID management of pain. Patient was injected with 30 mg IM of Toradol and prescribe Toradol pills with a muscle relaxer. Device specifically about symptoms that should warrant further evaluation in the emergency department. Patient had no neuromuscular deficits. Have been advised to followup with his primary care Dr. about his recurrent and ongoing back pains. He agrees, he also requested to be referred to an orthopedic doctor which I accepted and proceeded to provide him with phone number to call [facility]. Patient with no constitutional symptoms to make me suspect of a secondary reason for his lumbar pain and this is a new lead develop episode.  Upon discharge nurse approaches me and describes patient was looking up ketorolac and was upset that this  medicine was not a narcotic. Patient was observed walking off the premises and no antalgic walk or any ambulation problems.       Jimmie Molly, MD 01/31/12 [phone]

## 2012-01-31 NOTE — ED Notes (Signed)
C/o aching pain in lt lower back that started yesterday.  Reports today since 130 has been sharp- states he has been "stretching" some carpet today.  Hx of back injury 1-2 months ago.  Also hx of kidney stone per pt.

## 2012-02-04 ENCOUNTER — Telehealth (HOSPITAL_COMMUNITY): Payer: Self-pay | Admitting: *Deleted

## 2012-02-04 NOTE — ED Notes (Signed)
Pt. called and said he was billed for 7/19. 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 He would have to call the billing office and given the number.  I told him we saw him 5/30 and 7/25.  He was seen at [facility] on 7/14. Vassie Moselle 02/04/2012

## 2012-07-16 ENCOUNTER — Ambulatory Visit
Admission: RE | Admit: 2012-07-16 | Discharge: 2012-07-16 | Disposition: A | Discharge status: Home / Self Care | Payer: 59 | Source: Ambulatory Visit | Attending: Internal Medicine | Admitting: Internal Medicine

## 2012-07-16 ENCOUNTER — Other Ambulatory Visit: Payer: Self-pay | Admitting: Internal Medicine

## 2012-07-16 DIAGNOSIS — M549 Dorsalgia, unspecified: Secondary | ICD-10-CM

## 2012-07-16 DIAGNOSIS — W19XXXA Unspecified fall, initial encounter: Secondary | ICD-10-CM

## 2012-07-16 IMAGING — CR DG LUMBAR SPINE COMPLETE 4+V
5 series · 5 of 5 positions shown · non-contrast
Comparison: [DATE]

CLINICAL DATA: Left lower back pain.

LUMBAR SPINE - COMPLETE 4+ VIEW

[view not recorded (1 of 5)]
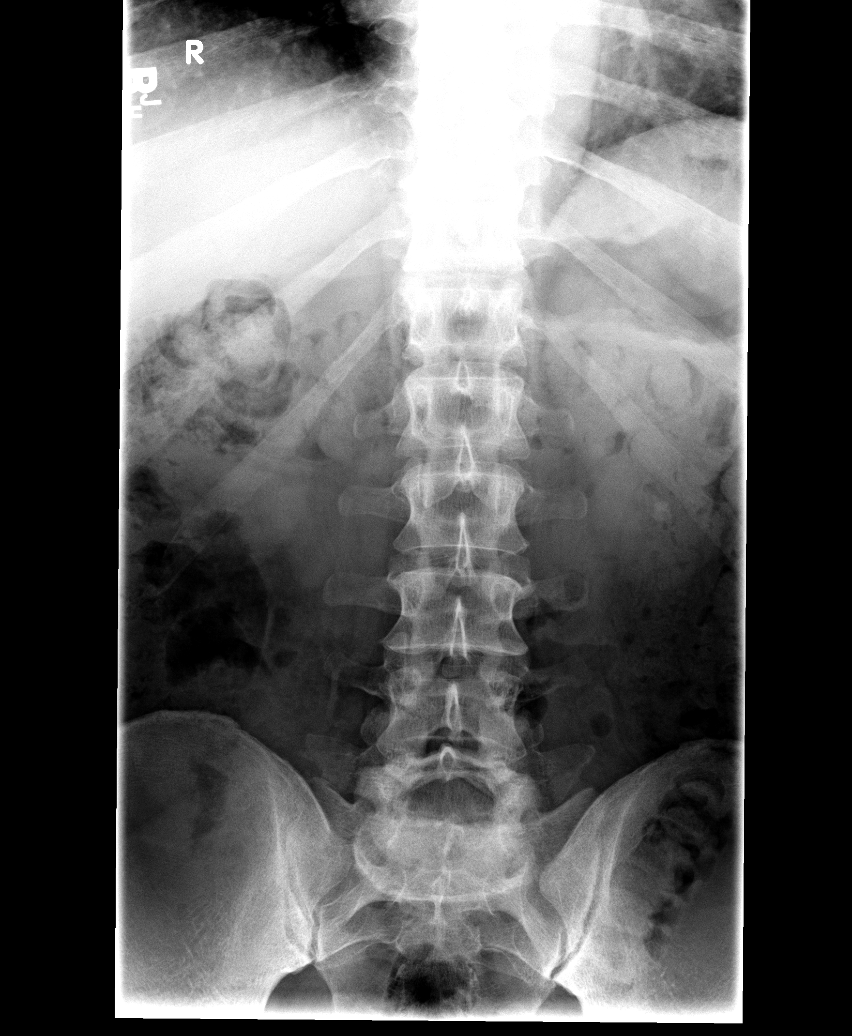

[view not recorded (2 of 5)]
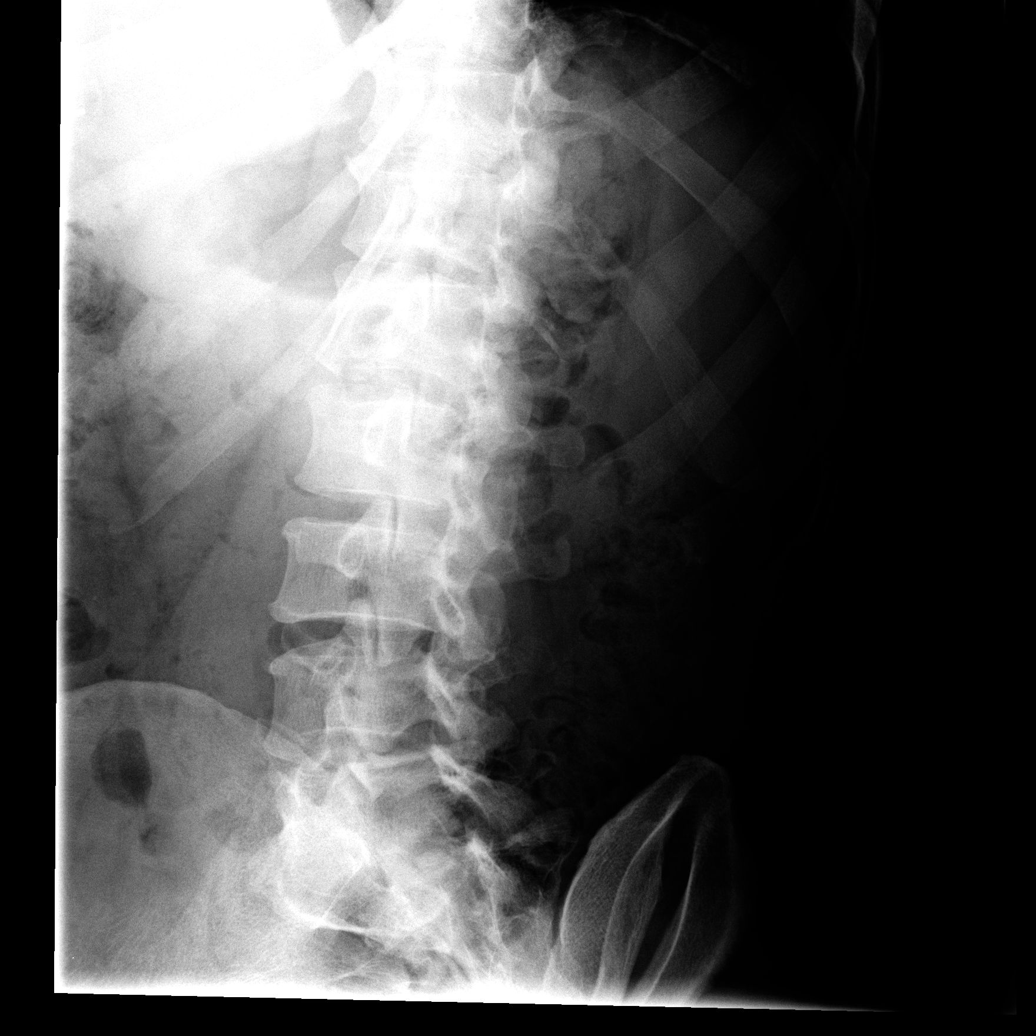

[view not recorded (3 of 5)]
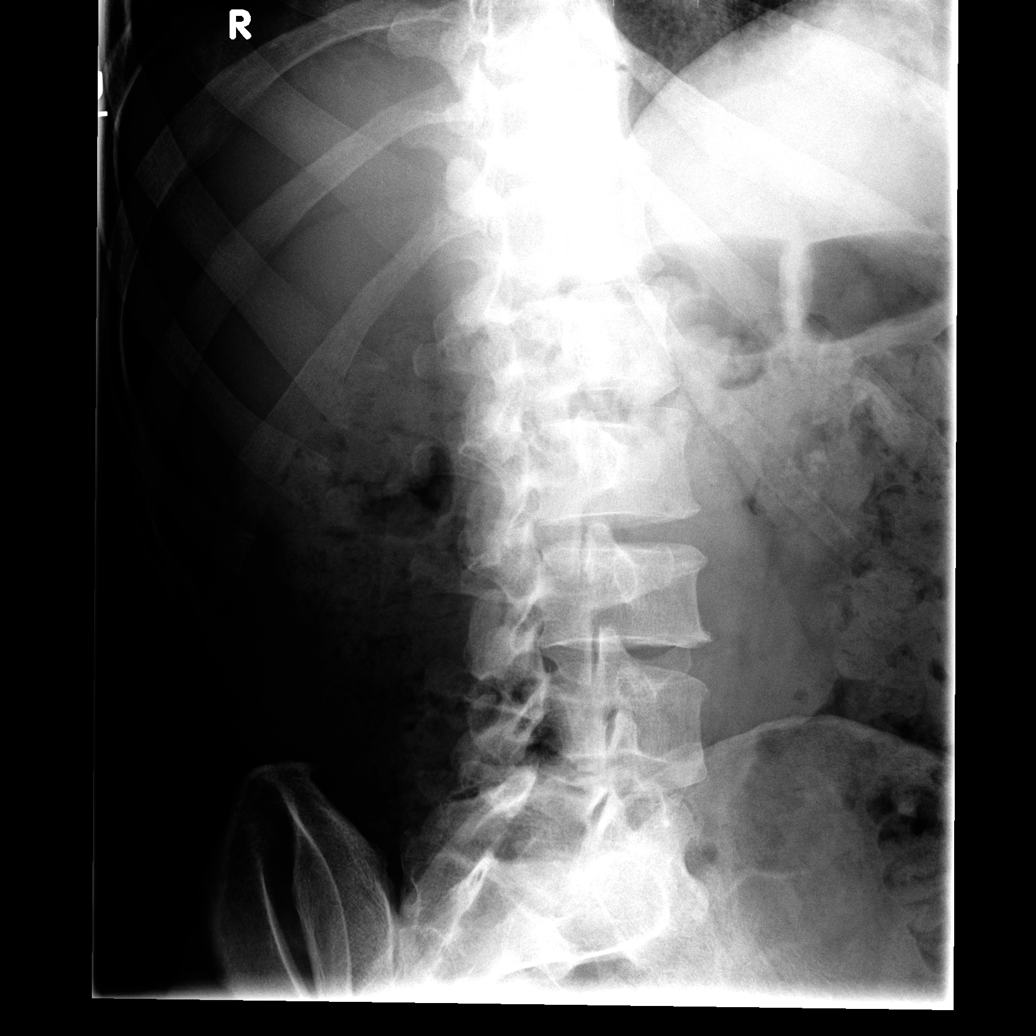

[view not recorded (4 of 5)]
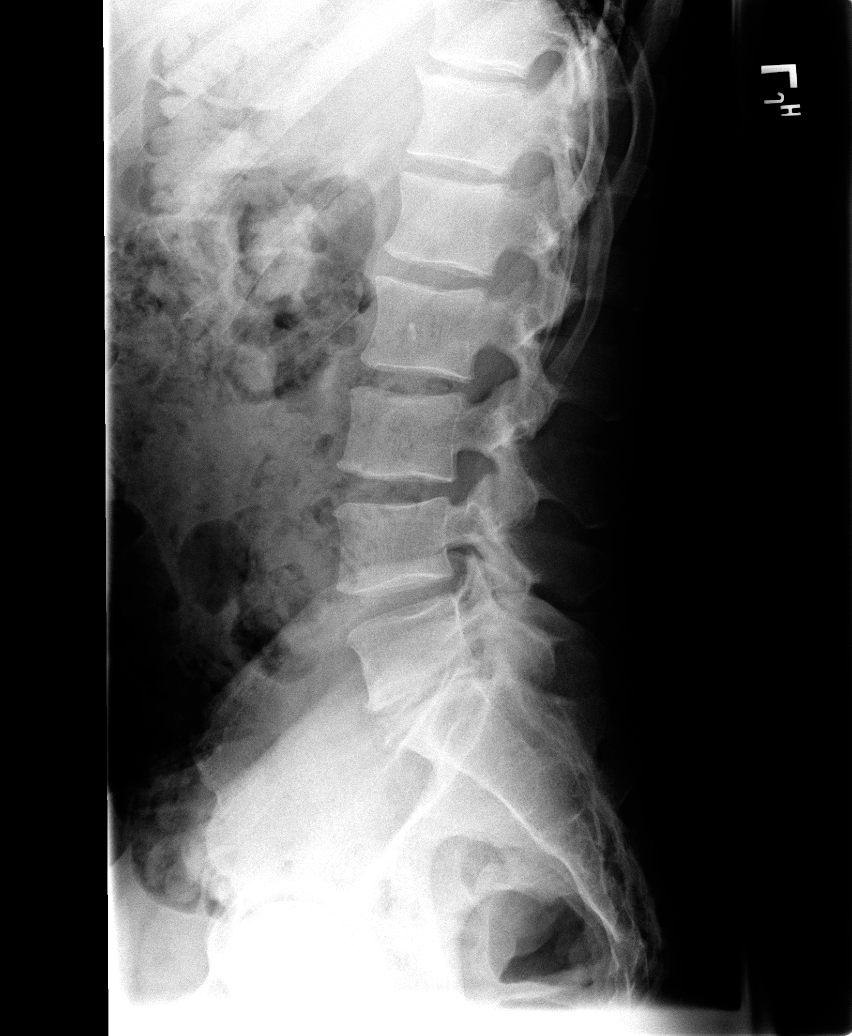

[view not recorded (5 of 5)]
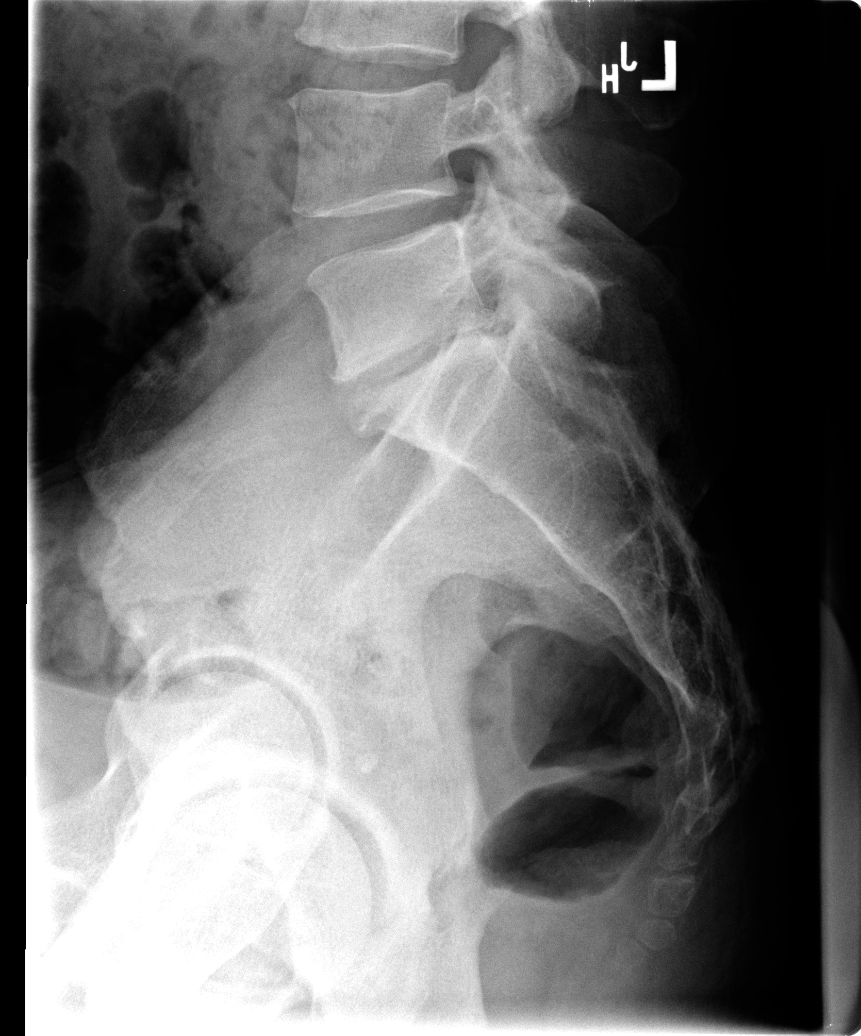

[5 of 5 positions shown; findings below may reference images not displayed]

FINDINGS: Degenerative disc disease at L5 S1 with disc space
narrowing and spurring.  Degenerative facet disease from L4-5
through L5 S1.  Normal alignment.  No fracture.  SI joints are
symmetric and unremarkable.

9 mm calcification projects over the lower pole of the left kidney
compatible with left lower pole renal stone.  Moderate stool burden
throughout the colon.
IMPRESSION: Degenerative disc and facet disease in the lower lumbar spine.

9 mm left lower pole renal stone, similar to prior study.

## 2012-10-02 ENCOUNTER — Ambulatory Visit
Admission: RE | Admit: 2012-10-02 | Discharge: 2012-10-02 | Disposition: A | Discharge status: Home / Self Care | Payer: 59 | Source: Ambulatory Visit | Attending: Internal Medicine | Admitting: Internal Medicine

## 2012-10-02 ENCOUNTER — Other Ambulatory Visit: Payer: Self-pay | Admitting: Internal Medicine

## 2012-10-02 DIAGNOSIS — W19XXXA Unspecified fall, initial encounter: Secondary | ICD-10-CM

## 2012-10-02 IMAGING — CR DG ELBOW COMPLETE 3+V*R*
2 series · 2 of 2 positions shown · non-contrast
Comparison: None.

CLINICAL DATA: History of injury from fall.  Pain.

RIGHT ELBOW - COMPLETE 3+ VIEW

[view not recorded (1 of 2)]
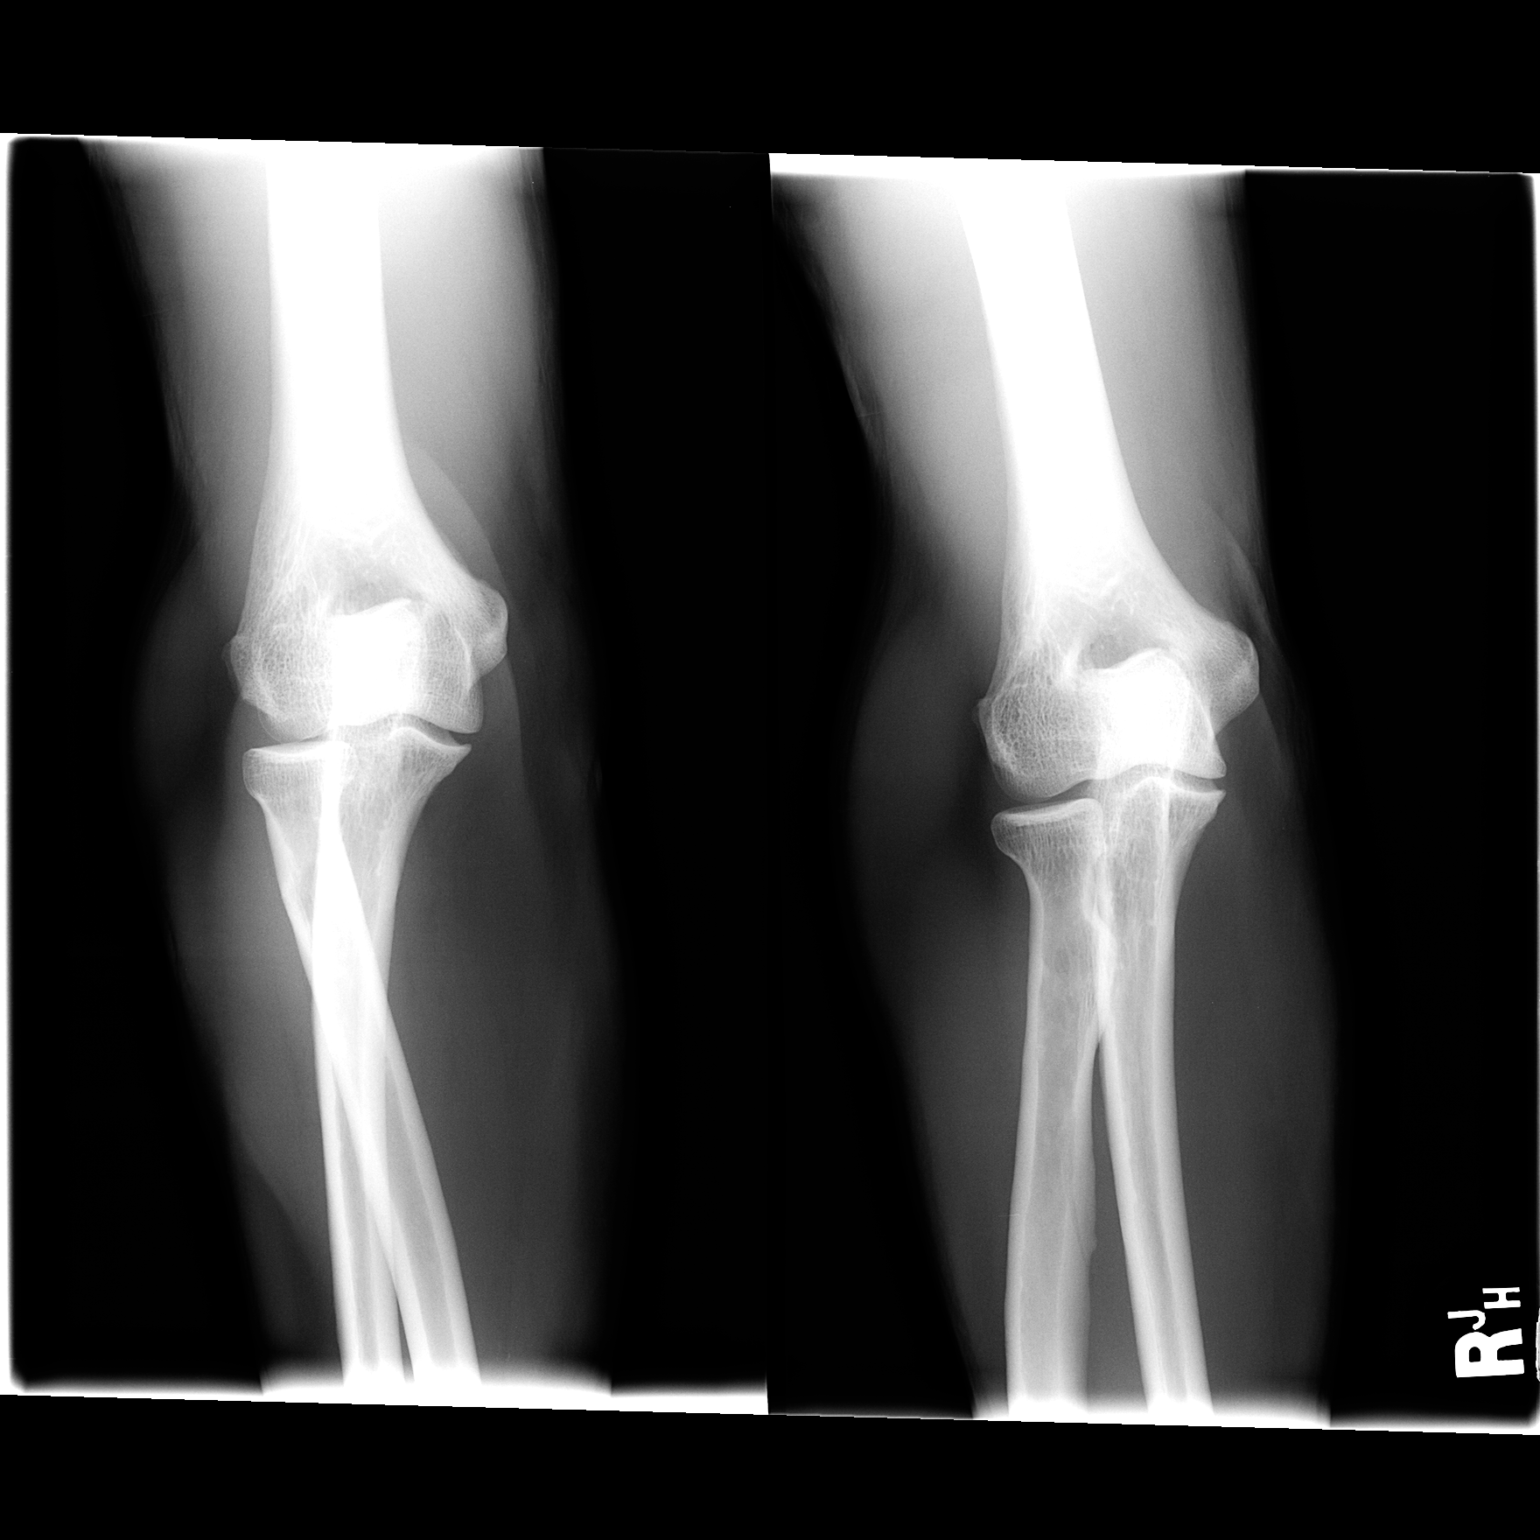

[view not recorded (2 of 2)]
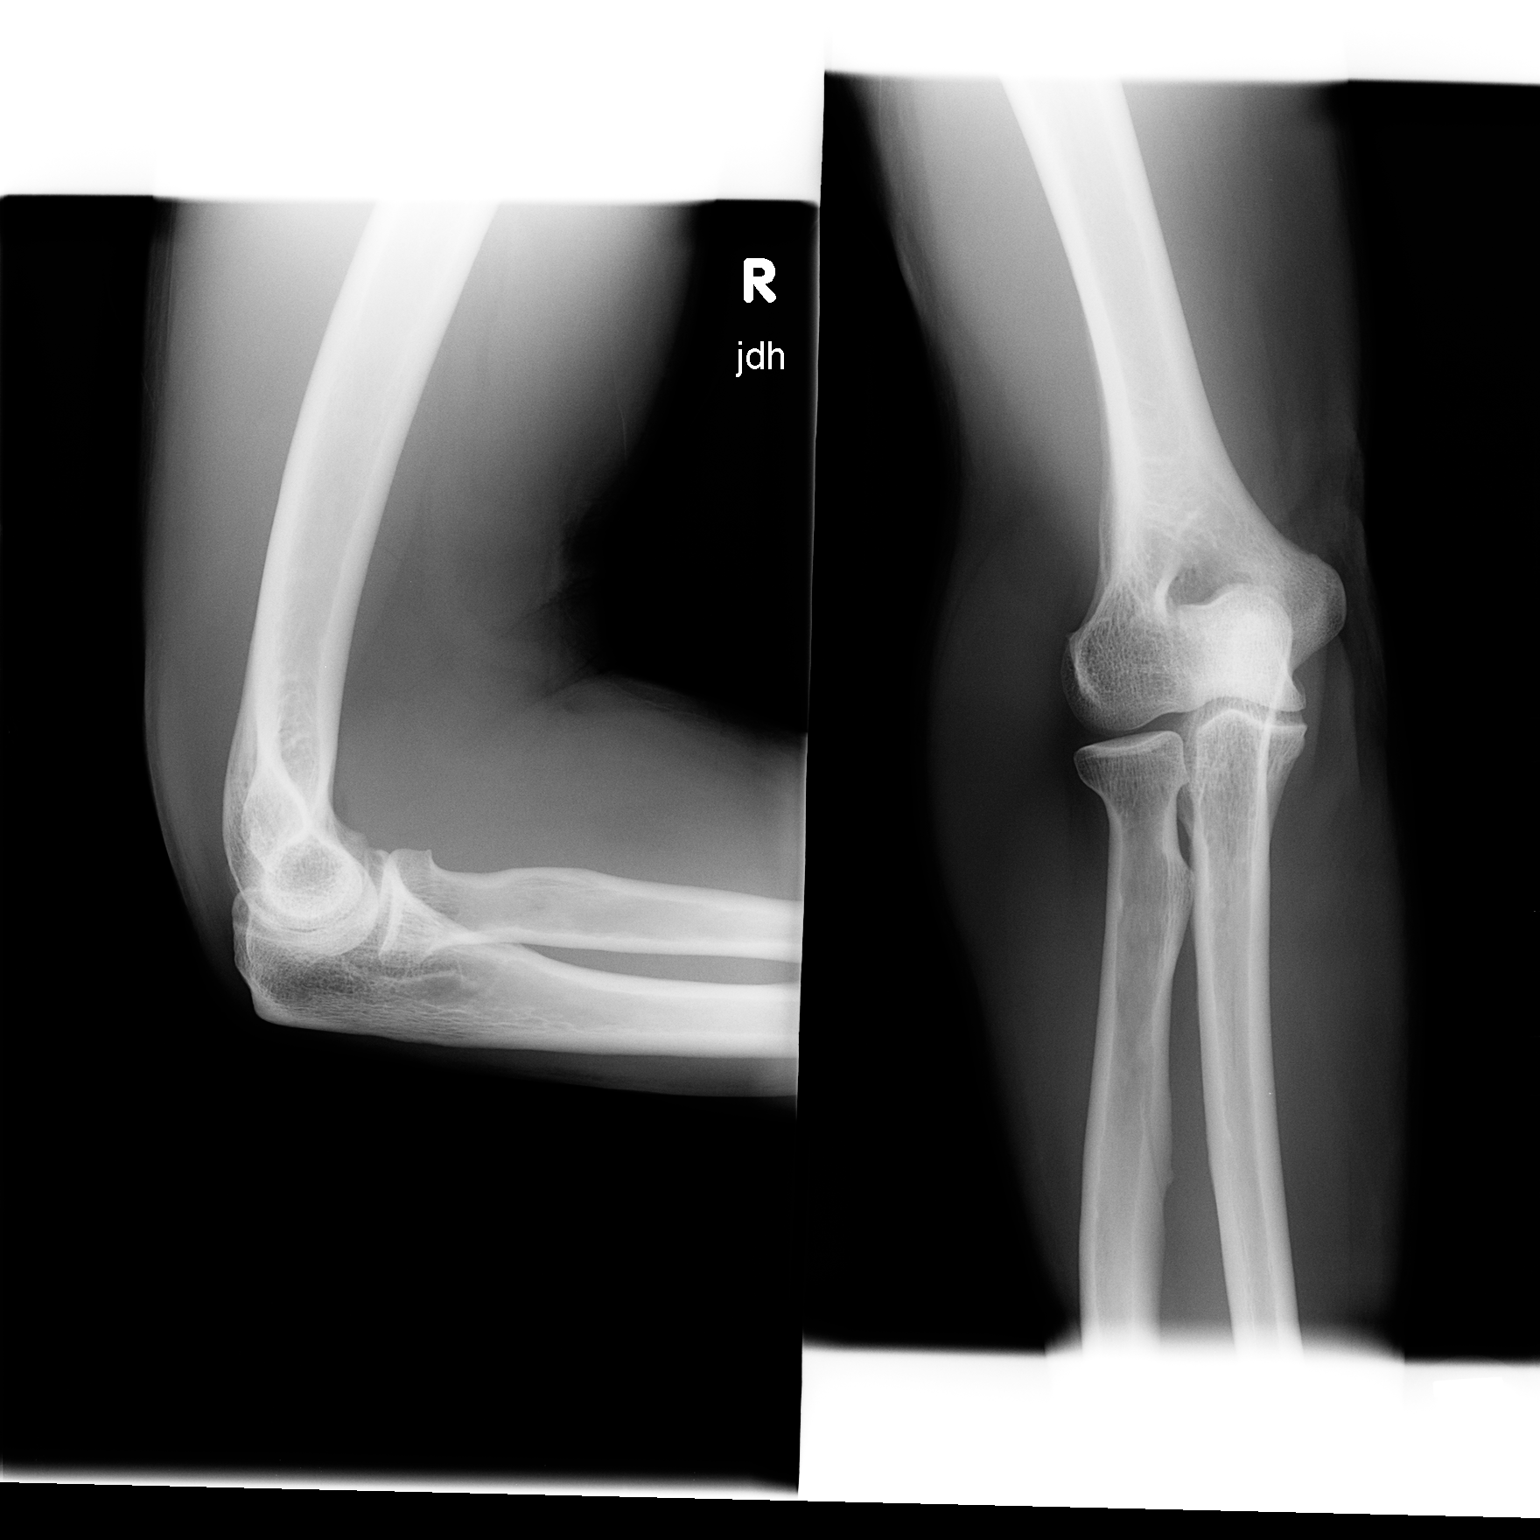

[2 of 2 positions shown; findings below may reference images not displayed]

FINDINGS: No positive fat pad sign is seen to suggest joint
effusion.  There is minimal spurring of the radial head.  No
fracture or dislocation is evident.
IMPRESSION: No evidence of joint effusion.  No fracture dislocation.

## 2012-10-02 IMAGING — CR DG LUMBAR SPINE COMPLETE 4+V
5 series · 5 of 5 positions shown · non-contrast
Comparison: [DATE].

CLINICAL DATA: 47-year-old male status post fall.  Low back and SI
region pain.

LUMBAR SPINE - COMPLETE 4+ VIEW

[view not recorded (1 of 5)]
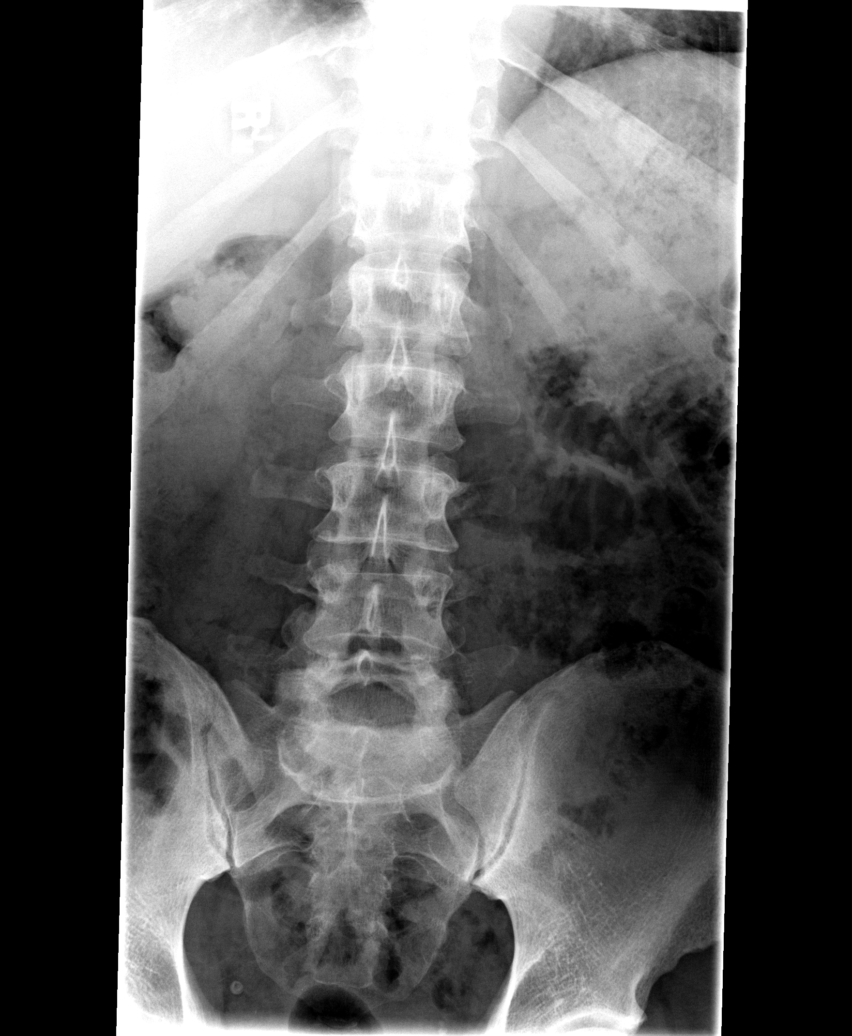

[view not recorded (2 of 5)]
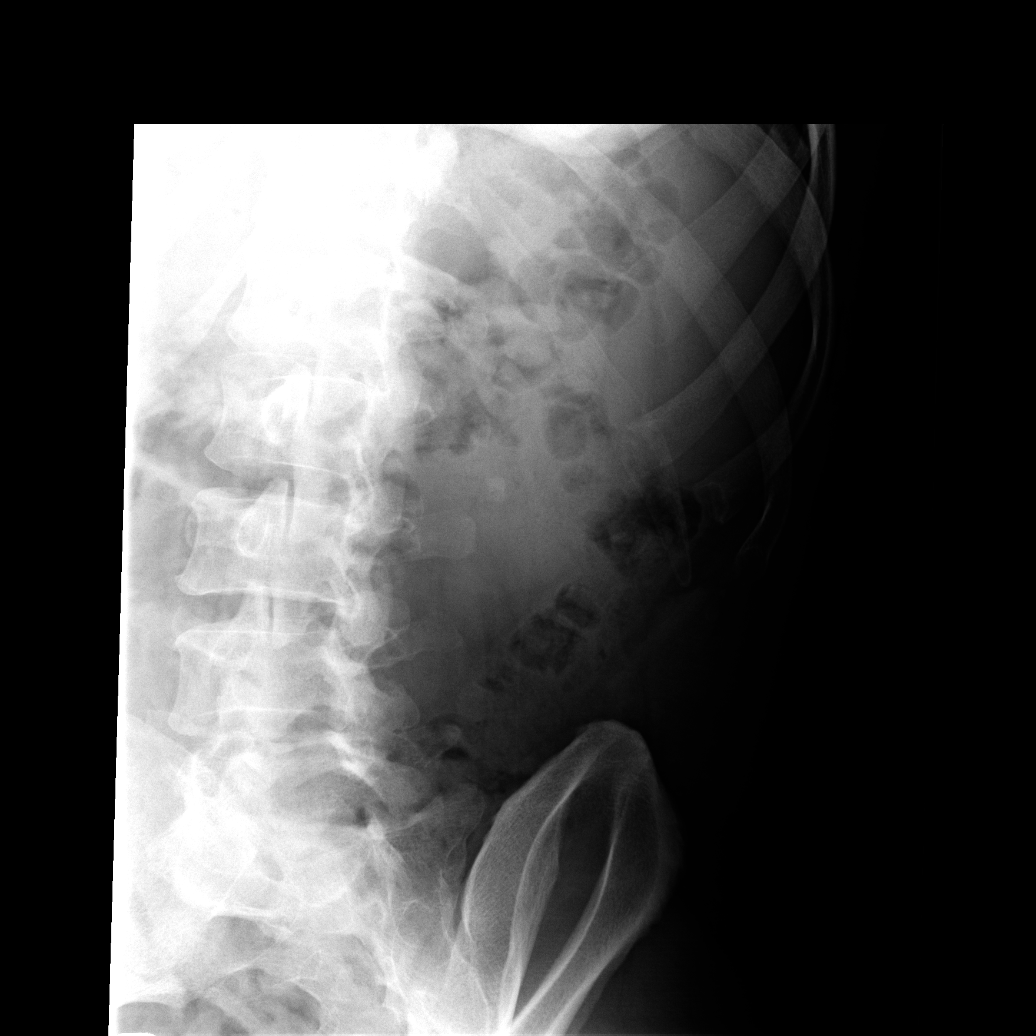

[view not recorded (3 of 5)]
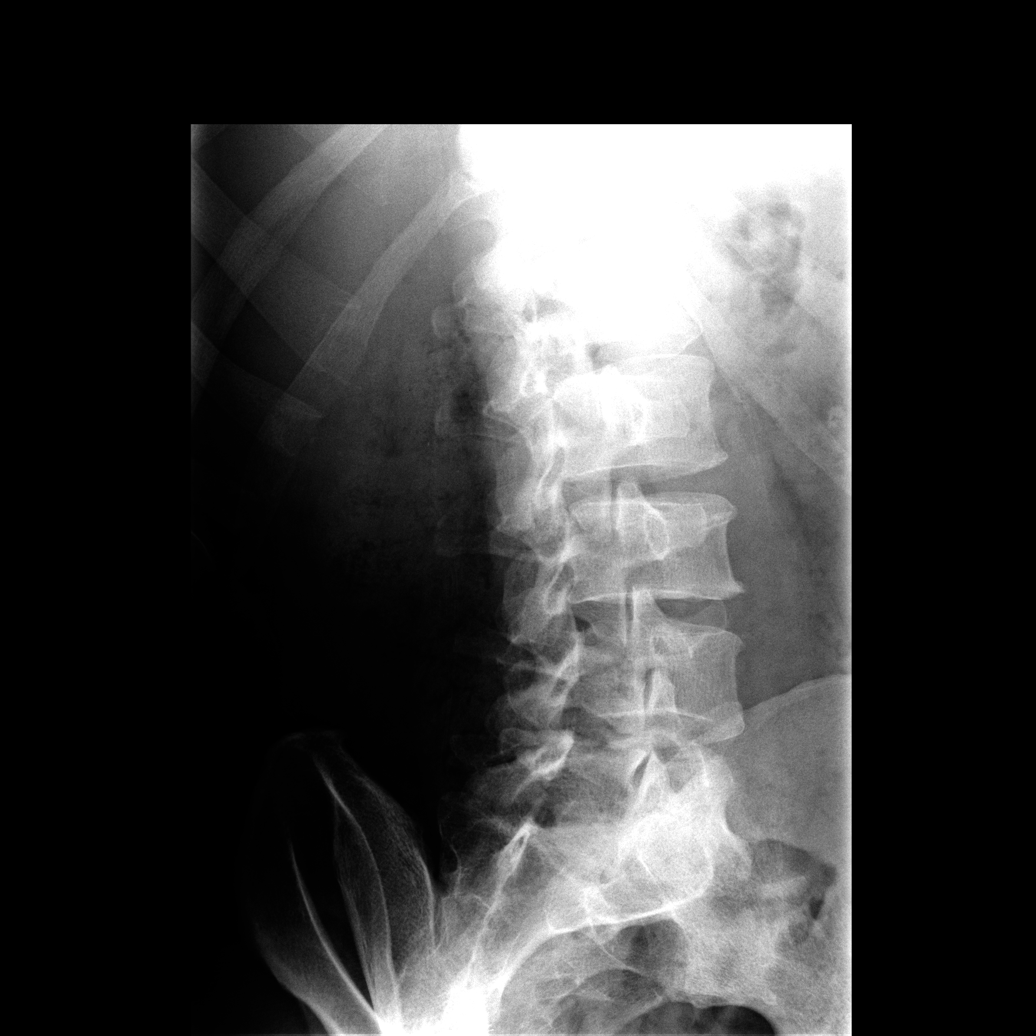

[view not recorded (4 of 5)]
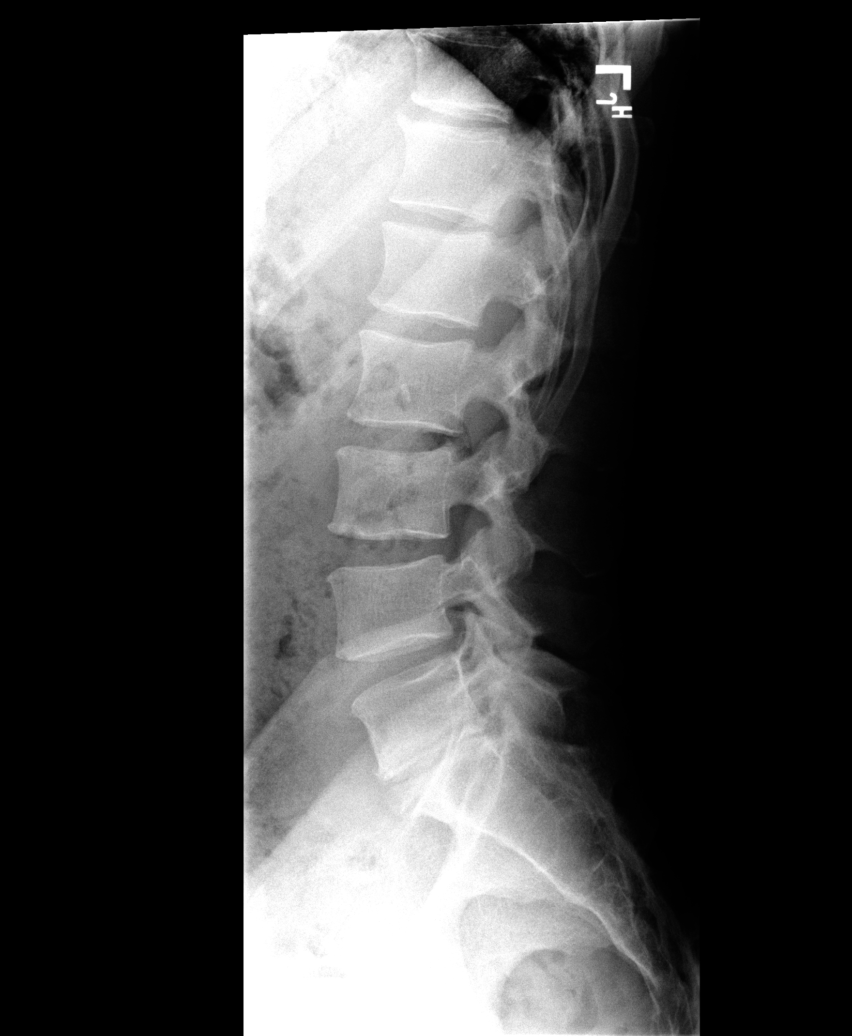

[view not recorded (5 of 5)]
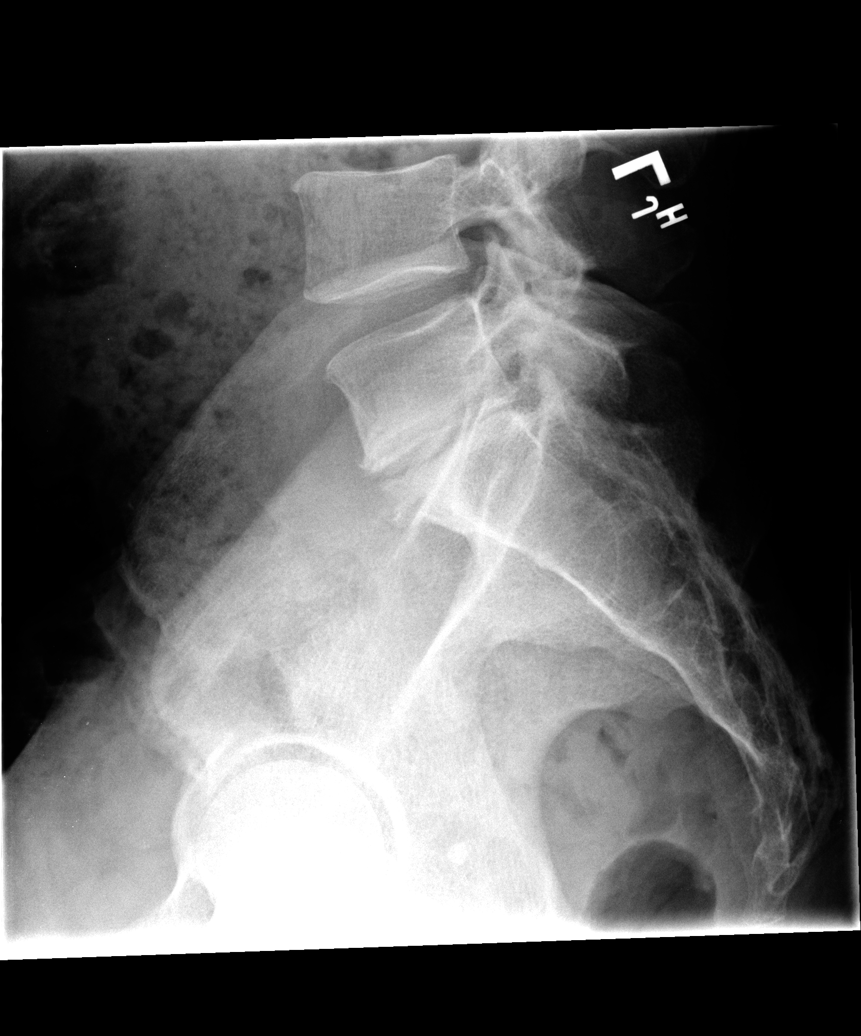

[5 of 5 positions shown; findings below may reference images not displayed]

FINDINGS: Normal lumbar segmentation.  Stable and normal lumbar
vertebral height and alignment.  Stable L5-S1 disc space loss and
endplate spurring.  No pars fracture.  Left lower pole region left
renal calculus again suspected.  Sacral ala and SI joints appear
stable and within normal limits.
IMPRESSION: No acute osseous abnormality in the lumbar spine.  Chronic left
lower pole nephrolithiasis.

## 2012-10-02 IMAGING — CR DG SHOULDER 2+V*R*
4 series · 4 of 4 positions shown · non-contrast
Comparison: None.

CLINICAL DATA: History of injury from fall with pain.

RIGHT SHOULDER - 2+ VIEW

[view not recorded (1 of 4)]
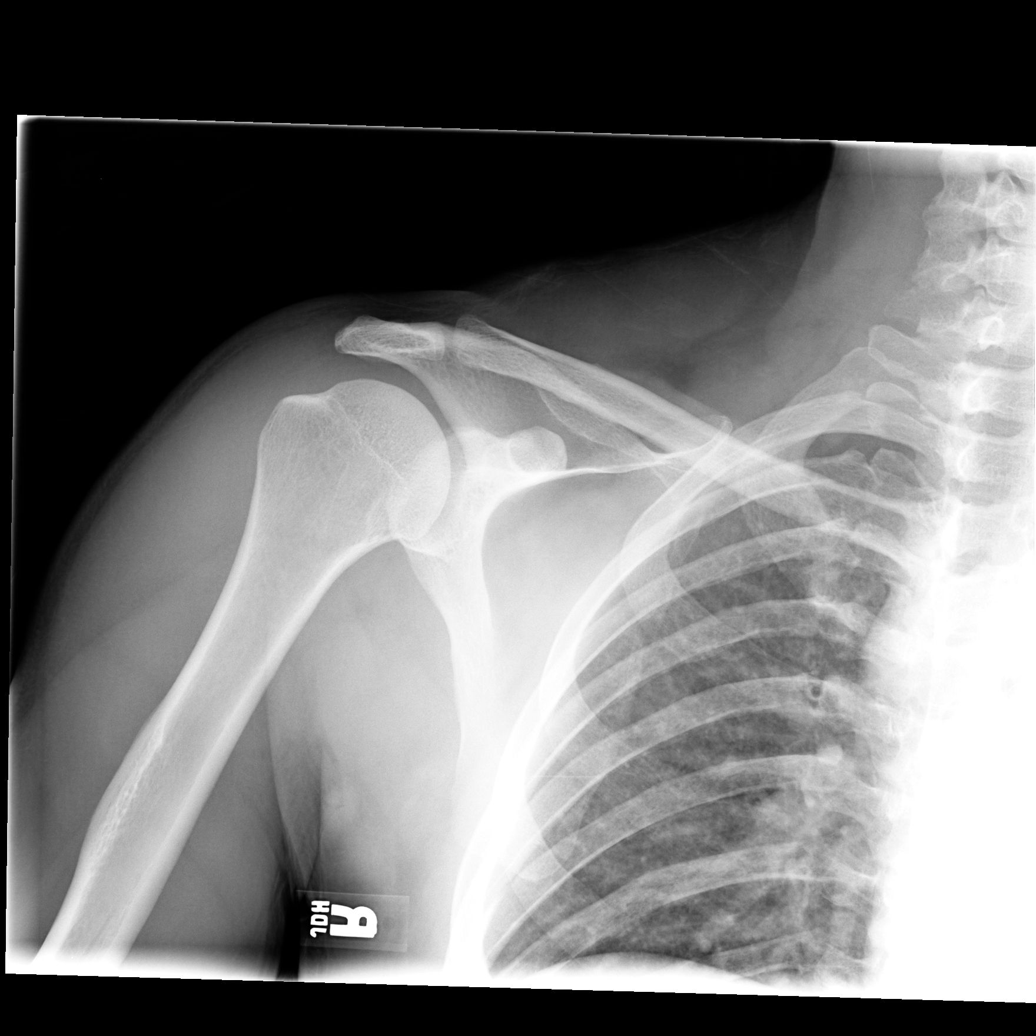

[view not recorded (2 of 4)]
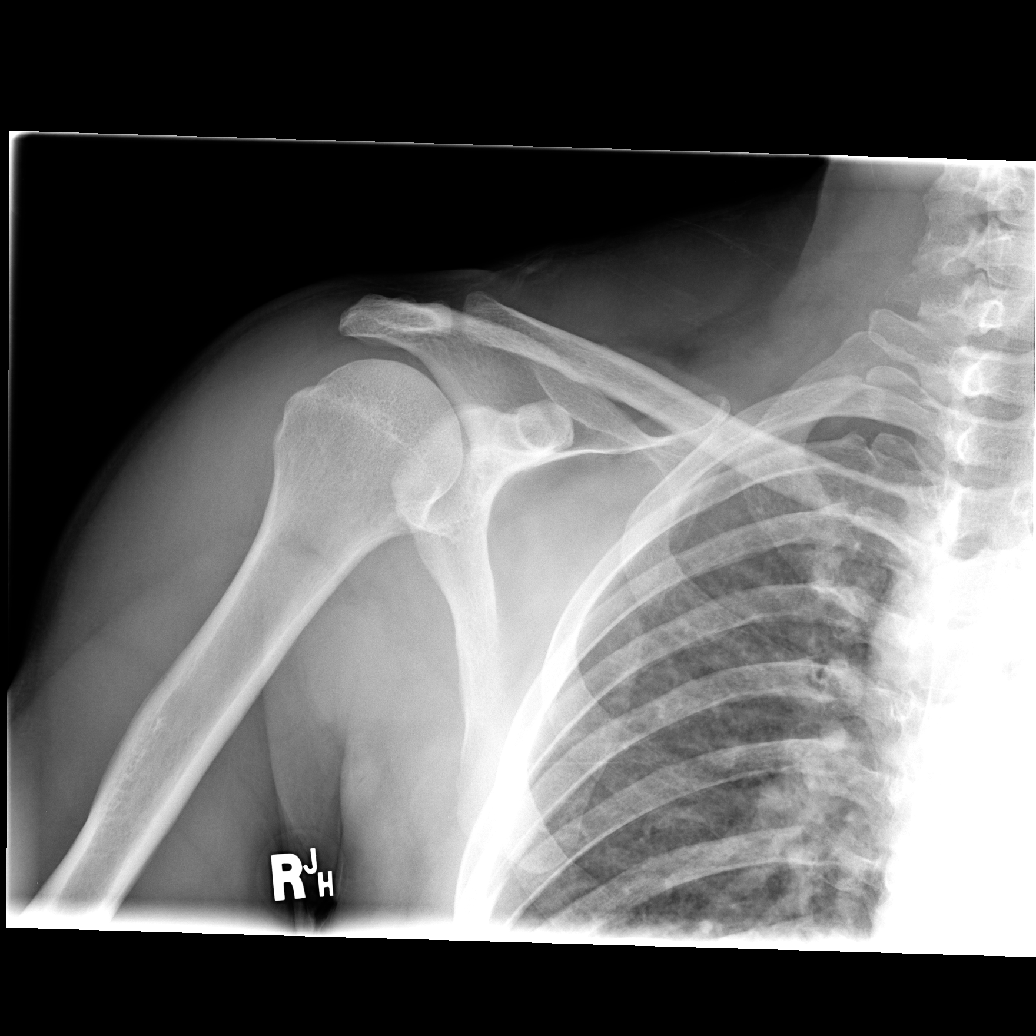

[view not recorded (3 of 4)]
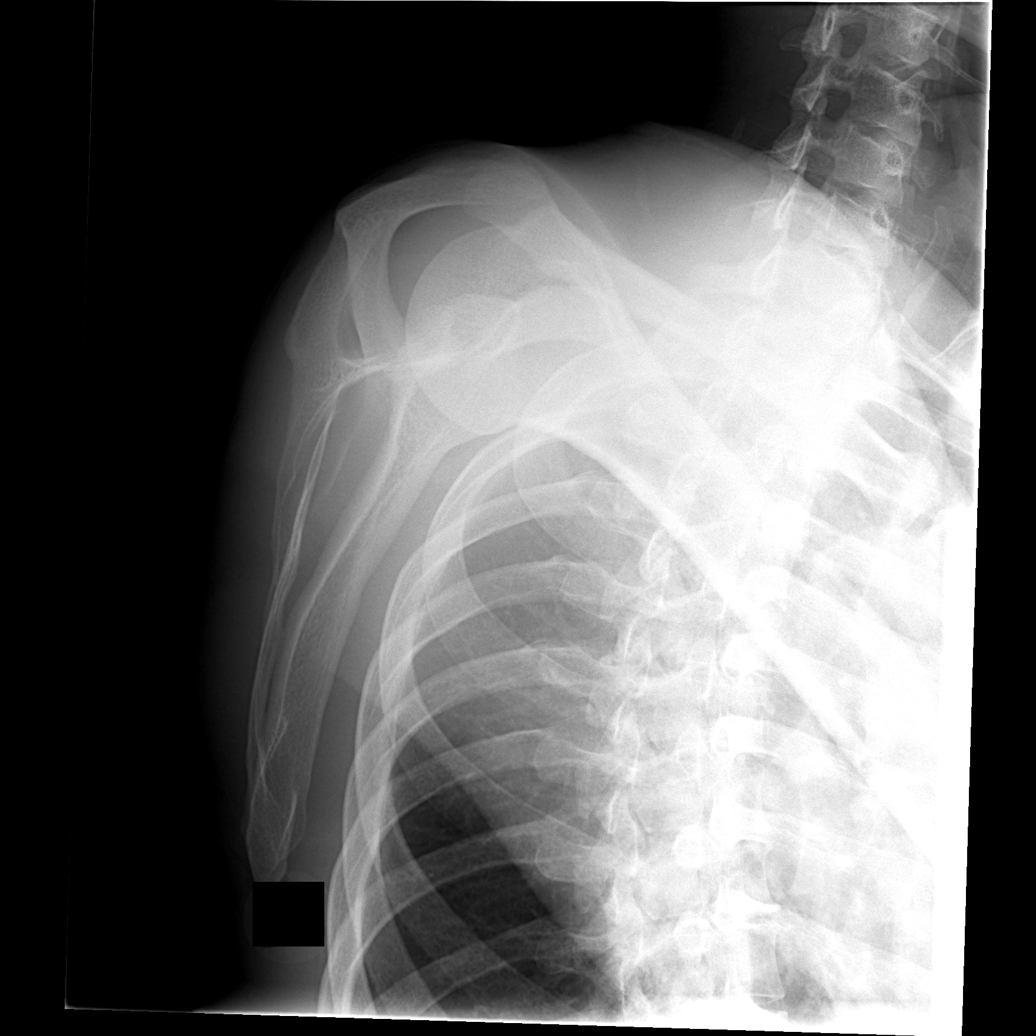

[view not recorded (4 of 4)]
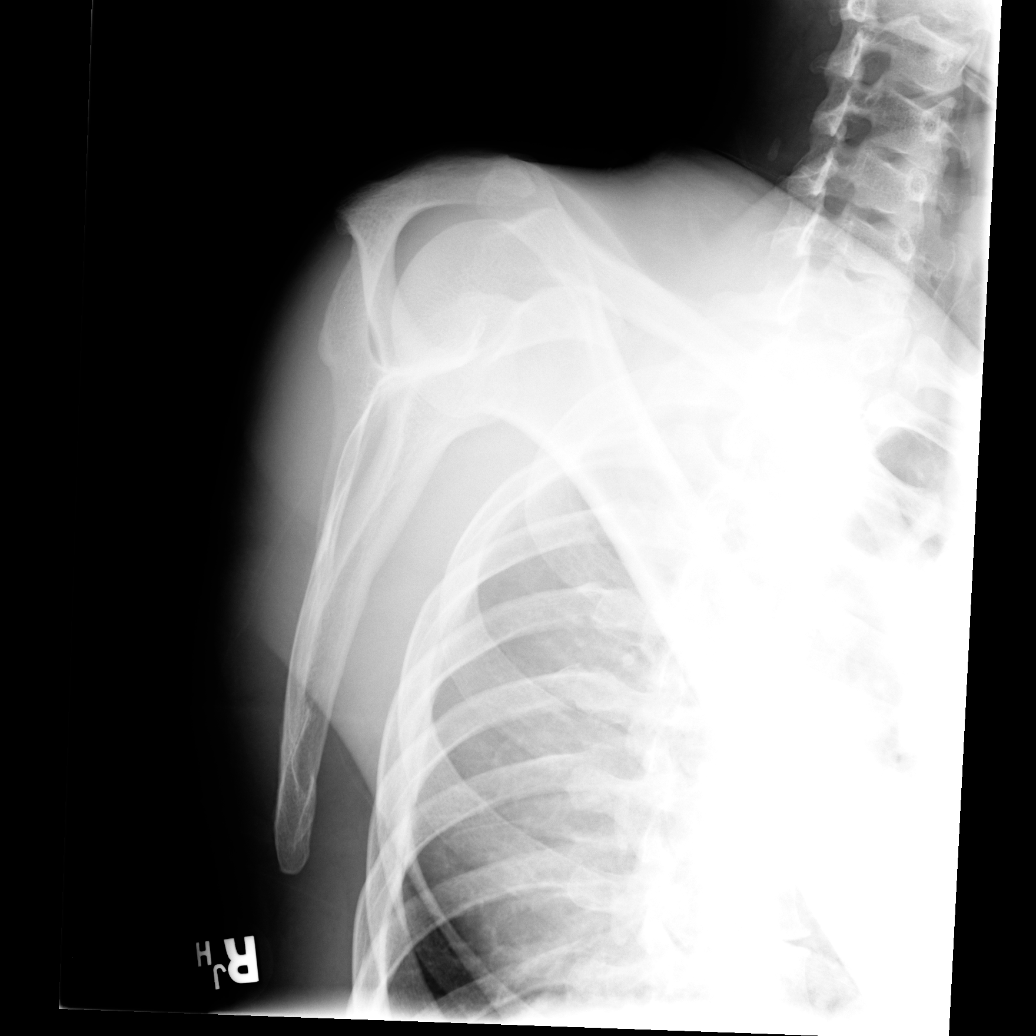

[4 of 4 positions shown; findings below may reference images not displayed]

FINDINGS: Alignment is normal.  Joint spaces are preserved.  No
fracture or dislocation is evident.  No soft tissue lesions are
seen.
IMPRESSION: No fracture or dislocation is evident.

## 2012-10-21 ENCOUNTER — Other Ambulatory Visit (HOSPITAL_COMMUNITY): Payer: Self-pay | Admitting: Physical Medicine and Rehabilitation

## 2012-10-21 DIAGNOSIS — M5416 Radiculopathy, lumbar region: Secondary | ICD-10-CM

## 2012-10-21 DIAGNOSIS — M545 Low back pain: Secondary | ICD-10-CM

## 2012-10-23 ENCOUNTER — Ambulatory Visit (HOSPITAL_COMMUNITY)
Admission: RE | Admit: 2012-10-23 | Discharge: 2012-10-23 | Disposition: A | Discharge status: Home / Self Care | Payer: 59 | Source: Ambulatory Visit | Attending: Physical Medicine and Rehabilitation | Admitting: Physical Medicine and Rehabilitation

## 2012-10-23 DIAGNOSIS — M545 Low back pain: Secondary | ICD-10-CM

## 2012-10-23 DIAGNOSIS — M5416 Radiculopathy, lumbar region: Secondary | ICD-10-CM

## 2012-10-25 ENCOUNTER — Encounter (HOSPITAL_COMMUNITY): Payer: Self-pay | Admitting: Emergency Medicine

## 2012-10-25 ENCOUNTER — Emergency Department (HOSPITAL_COMMUNITY)
Admission: EM | Admit: 2012-10-25 | Discharge: 2012-10-25 | Disposition: A | Discharge status: Home / Self Care | Payer: 59 | Source: Home / Self Care | Attending: Emergency Medicine | Admitting: Emergency Medicine

## 2012-10-25 DIAGNOSIS — M549 Dorsalgia, unspecified: Secondary | ICD-10-CM

## 2012-10-25 MED ORDER — OXYCODONE-ACETAMINOPHEN 5-325 MG PO TABS: 1.0000 | ORAL_TABLET | ORAL | Status: DC | PRN

## 2012-10-25 NOTE — ED Notes (Signed)
Pt c/o lower back pain from falling off neighbors porch. Incident happened on 3/25. And 4/18 fell off work truck. Pt states that he is scheduled for for an MRI on Wednesday. Pt states that the pain is constant and shoots down left leg. Pt was given 60 percocet on 4/2 and is out of meds and states that he needs something for pain.

## 2012-10-25 NOTE — ED Notes (Signed)
Waiting for discharge papers

## 2012-10-28 NOTE — ED Provider Notes (Signed)
Medical screening examination/treatment/procedure(s) were performed by non-physician practitioner and as supervising physician I was immediately available for consultation/collaboration.  Coda Mathey, M.D.  Danell Vazquez C Apolonia Ellwood, MD 10/28/12 2157 

## 2012-10-28 NOTE — ED Provider Notes (Signed)
History     CSN: [id number]  Arrival date & time 10/25/12  1514   First MD Initiated Contact with Patient 10/25/12 1542      Chief Complaint  Patient presents with  . Back Pain    lower left sided back pain. pt states that he fell off neighbors porch and hurt back.     HPI: Patient is a 47 y.o. male presenting with back pain. The history is provided by the patient.  Back Pain Location:  Lumbar spine Quality:  Burning and shooting Radiates to:  L posterior upper leg Pain severity:  Severe Pain is:  Same all the time Onset quality:  Gradual Duration:  3 weeks Timing:  Constant Progression:  Worsening Chronicity:  Recurrent Context: falling and recent injury   Relieved by:  Narcotics Worsened by:  Standing and bending Associated symptoms: leg pain   Associated symptoms: no abdominal pain, no bladder incontinence, no bowel incontinence, no dysuria, no fever, no numbness, no paresthesias, no perianal numbness, no tingling and no weakness   Patient reports greater than one year history of intermittent low back pain. Symptoms recently exacerbated by a couple of falls the most recent of which was 10/02/2012. Patient is currently under the care of Dr. Ethelene Hal with [facility]. Pt went to have MRI on 10/22/12 but had a "panic attack" and could not complete. He was told by staff at [facility] that he was to come there today for MRI and that he would be sedated. When he arrived he was told he was supposed to pick up Valium from pharmacy that had been called in for him. Pt states no one told him he needed to get med from pharmacy so MRI could not be done today. Pt states he has been rescheduled for MRI on Monday. He is out of medication for pain and having severe left lower back pain that radiates into his posterior (L) upper leg. Denies weakness. States Morphine and dilaudid do not help his pain and that Percocet is the only thing that helps.  Past Medical History  Diagnosis Date   . Kidney stones   . Kidney stone   . Back injury     Past Surgical History  Procedure Laterality Date  . Rhinoplasty      History reviewed. No pertinent family history.  History  Substance Use Topics  . Smoking status: Never Smoker   . Smokeless tobacco: Not on file  . Alcohol Use: No      Review of Systems  Constitutional: Negative for fever.  HENT: Negative.   Eyes: Negative.   Respiratory: Negative.   Cardiovascular: Negative.   Gastrointestinal: Negative.  Negative for abdominal pain and bowel incontinence.  Endocrine: Negative.   Genitourinary: Negative for bladder incontinence, dysuria, frequency, flank pain and difficulty urinating.  Musculoskeletal: Positive for back pain.  Allergic/Immunologic: Negative.   Neurological: Negative.  Negative for tingling, weakness, numbness and paresthesias.  Hematological: Negative.   Psychiatric/Behavioral: Negative.     Allergies  Vicodin  Home Medications   Current Outpatient Rx  Name  Route  Sig  Dispense  Refill  . acetaminophen (TYLENOL) 500 MG tablet   Oral   Take 500 mg by mouth every 6 (six) hours as needed.         Marland Kitchen oxyCODONE-acetaminophen (PERCOCET/ROXICET) 5-325 MG per tablet   Oral   Take 1 tablet by mouth every 4 (four) hours as needed for pain.   10 tablet   0  BP 127/90  Pulse 86  Temp(Src) 97.5 F (36.4 C) (Oral)  Resp 16  SpO2 100%  Physical Exam  Constitutional: He is oriented to person, place, and time. He appears well-developed and well-nourished.  HENT:  Head: Normocephalic and atraumatic.  Eyes: Conjunctivae are normal.  Cardiovascular: Normal rate.   Musculoskeletal:       Lumbar back: He exhibits tenderness.       Back:  Pt w/ limited ROM d/t pain.   Neurological: He is alert and oriented to person, place, and time.  Skin: Skin is warm and dry.  Psychiatric: He has a normal mood and affect.    ED Course  Procedures (including critical care time)  Labs Reviewed -  No data to display No results found.   1. Acute back pain       MDM  Patient reports > 1 year h/o recurrent LBP exacerbated by recent fall. Under care of Dr Ethelene Hal w/ [facility]. Was supposed to get MRI today but did not understand that he was to get a pre-med from Rx before arrival so MRI cancelled. Pt is out of meds for pain and having severe LBP. No focal neurological findings. Discussed w/ pt my concerns regarding pt's multiple prescriptions for pain meds and other scheduled meds revealed on the [facility]. [place] Controlled Substance Reporting Database. As there is documentation that MRI was ordered and that pt is under Dr Ramos's care will prescribe 10 Percocets to get pt to Monday MRI and instructed him that Dr Ethelene Hal would need to make decisions regarding his pain management medication from that point forward. Pt agreeable w/ plan.         Roma Kayser Chennel Olivos, NP 10/28/12 [phone]

## 2014-01-20 ENCOUNTER — Encounter (HOSPITAL_COMMUNITY): Payer: Self-pay | Admitting: Emergency Medicine

## 2014-01-20 ENCOUNTER — Emergency Department (HOSPITAL_COMMUNITY)
Admission: EM | Admit: 2014-01-20 | Discharge: 2014-01-20 | Disposition: A | Discharge status: Nursing Home (Includes ICF) | Payer: 59 | Source: Home / Self Care | Attending: Family Medicine | Admitting: Family Medicine

## 2014-01-20 ENCOUNTER — Emergency Department (HOSPITAL_COMMUNITY)
Admission: EM | Admit: 2014-01-20 | Discharge: 2014-01-20 | Disposition: A | Discharge status: Home / Self Care | Payer: 59 | Attending: Emergency Medicine | Admitting: Emergency Medicine

## 2014-01-20 DIAGNOSIS — K4090 Unilateral inguinal hernia, without obstruction or gangrene, not specified as recurrent: Secondary | ICD-10-CM | POA: Insufficient documentation

## 2014-01-20 DIAGNOSIS — Z87828 Personal history of other (healed) physical injury and trauma: Secondary | ICD-10-CM | POA: Insufficient documentation

## 2014-01-20 DIAGNOSIS — K4091 Unilateral inguinal hernia, without obstruction or gangrene, recurrent: Secondary | ICD-10-CM

## 2014-01-20 DIAGNOSIS — Z87442 Personal history of urinary calculi: Secondary | ICD-10-CM | POA: Insufficient documentation

## 2014-01-20 MED ORDER — TRAMADOL HCL 50 MG PO TABS
50.0000 mg | ORAL_TABLET | Freq: Four times a day (QID) | ORAL | Status: DC | PRN
Start: 2014-01-20 — End: 2014-01-20

## 2014-01-20 MED ORDER — OXYCODONE-ACETAMINOPHEN 5-325 MG PO TABS
ORAL_TABLET | ORAL | Status: DC
Start: 2014-01-20 — End: 2014-01-22

## 2014-01-20 NOTE — ED Notes (Signed)
MD at bedside. 

## 2014-01-20 NOTE — ED Provider Notes (Signed)
CSN: [id number]     Arrival date & time 01/20/14  1633 History   First MD Initiated Contact with Patient 01/20/14 1649     Chief Complaint  Patient presents with  . Hernia      HPI Pt was seen at 1650. Per pt and previous chart, c/o gradual onset and worsening of persistent left inguinal hernia "pain" since this morning. Pt states he has had a left inguinal hernia "pop in and out" for the past several weeks. States the hernia has been easily reducible by himself until 3 days ago "when it got harder to put back in." Pt states he was unable to reduce his hernia today, so he went to [facility] for evaluation. Pt was then sent to the ED for further evaluation after [facility] MD was unable to reduce his hernia. Denies back pain, no N/V/D, no testicular pain/swelling, no fevers.    Past Medical History  Diagnosis Date  . Kidney stones   . Kidney stone   . Back injury    Past Surgical History  Procedure Laterality Date  . Rhinoplasty      History  Substance Use Topics  . Smoking status: Never Smoker   . Smokeless tobacco: Not on file  . Alcohol Use: No    Review of Systems ROS: Statement: All systems negative except as marked or noted in the HPI; Constitutional: Negative for fever and chills. ; ; Eyes: Negative for eye pain, redness and discharge. ; ; ENMT: Negative for ear pain, hoarseness, nasal congestion, sinus pressure and sore throat. ; ; Cardiovascular: Negative for chest pain, palpitations, diaphoresis, dyspnea and peripheral edema. ; ; Respiratory: Negative for cough, wheezing and stridor. ; ; Gastrointestinal: +left inguinal hernia pain. Negative for nausea, vomiting, diarrhea, blood in stool, hematemesis, jaundice and rectal bleeding. . ; ; Genitourinary: Negative for dysuria, flank pain and hematuria. ; ; Musculoskeletal: Negative for back pain and neck pain. Negative for swelling and trauma.; ; Skin: Negative for pruritus, rash, abrasions, blisters, bruising and skin lesion.; ; Neuro:  Negative for headache, lightheadedness and neck stiffness. Negative for weakness, altered level of consciousness , altered mental status, extremity weakness, paresthesias, involuntary movement, seizure and syncope.      Allergies  Vicodin  Home Medications   Prior to Admission medications   Medication Sig Start Date End Date Taking? Authorizing Provider  acetaminophen (TYLENOL) 500 MG tablet Take 1,000 mg by mouth every 6 (six) hours as needed for moderate pain.    Yes Historical Provider, MD  testosterone cypionate (DEPO-TESTOSTERONE) 200 MG/ML injection Inject 200 mg into the muscle every 14 (fourteen) days.   Yes Historical Provider, MD   BP 137/98  Pulse 87  Temp(Src) 98.5 F (36.9 C) (Oral)  Resp 20  SpO2 97% Physical Exam 1655: Physical examination:  Nursing notes reviewed; Vital signs and O2 SAT reviewed;  Constitutional: Well developed, Well nourished, Well hydrated, Uncomfortable appearing.; Head:  Normocephalic, atraumatic; Eyes: EOMI, PERRL, No scleral icterus; ENMT: Mouth and pharynx normal, Mucous membranes moist; Neck: Supple, Full range of motion, No lymphadenopathy; Cardiovascular: Regular rate and rhythm, No murmur, rub, or gallop; Respiratory: Breath sounds clear & equal bilaterally, No rales, rhonchi, wheezes.  Speaking full sentences with ease, Normal respiratory effort/excursion; Chest: Nontender, Movement normal; Abdomen: Soft, +left inguinal hernia tender to palp. No overlying abd wall erythema or ecchymosis. Nondistended, Normal bowel sounds; Genitourinary: No CVA tenderness; Extremities: Pulses normal, No tenderness, No edema, No calf edema or asymmetry.; Neuro: AA&Ox3, Major CN grossly intact.  Speech clear. No gross focal motor or sensory deficits in extremities. Climbs on and off stretcher easily by himself. Gait steady.; Skin: Color normal, Warm, Dry.   ED Course  Procedures     MDM  MDM Reviewed: previous chart, nursing note and vitals    1715:  Left  inguinal hernia reduced easily by myself. Pt states he feels "much better now" and wants to go home.  T/C to General Surgery Dr. Dwain SarnaWakefield, case discussed, including:  HPI, pertinent PM/SHx, VS/PE, dx testing, ED course and treatment:  No indication for acute surgical intervention at this time if hernia is reducible, have pt call office tomorrow morning for f/u appt tomorrow. Dx, as well as d/w General Surgeon, d/w pt and family.  Questions answered.  Verb understanding, agreeable to d/c home with outpt f/u.    Laray AngerKathleen M Rolen Conger, DO 01/22/14 1540

## 2014-01-20 NOTE — Discharge Instructions (Signed)
°Emergency Department Resource Guide °1) Find a Doctor and Pay Out of Pocket °Although you won't have to find out who is covered by your insurance plan, it is a good idea to ask around and get recommendations. You will then need to call the office and see if the doctor you have chosen will accept you as a new patient and what types of options they offer for patients who are self-pay. Some doctors offer discounts or will set up payment plans for their patients who do not have insurance, but you will need to ask so you aren't surprised when you get to your appointment. ° °2) Contact Your Local Health Department °Not all health departments have doctors that can see patients for sick visits, but many do, so it is worth a call to see if yours does. If you don't know where your local health department is, you can check in your phone book. The CDC also has a tool to help you locate your state's health department, and many state websites also have listings of all of their local health departments. ° °3) Find a Walk-in Clinic °If your illness is not likely to be very severe or complicated, you may want to try a walk in clinic. These are popping up all over the country in pharmacies, drugstores, and shopping centers. They're usually staffed by nurse practitioners or physician assistants that have been trained to treat common illnesses and complaints. They're usually fairly quick and inexpensive. However, if you have serious medical issues or chronic medical problems, these are probably not your best option. ° °No Primary Care Doctor: °- Call Health Connect at  832-8000 - they can help you locate a primary care doctor that  accepts your insurance, provides certain services, etc. °- Physician Referral Service- 1-800-533-3463 ° °Chronic Pain Problems: °Organization         Address  Phone   Notes  °[facility]  (336) 297-2271 Patients need to be referred by their primary care doctor.  ° °Medication  Assistance: °Organization         Address  Phone   Notes  °[place] [facility] [address] °[place], [place] [address] (336) 641-8030 --Must be a resident of [place] °-- Must have NO insurance coverage whatsoever (no Medicaid/ Medicare, etc.) °-- The pt. MUST have a primary care doctor that directs their care regularly and follows them in the community °  °[facility]  (866) 331-1348   °[employer]  (888) 892-1162   ° °Agencies that provide inexpensive medical care: °Organization         Address  Phone   Notes  °[facility]  (336) 832-8035   °[facility]    (336) 832-7272   °[facility] [address] °[place], [place] [address] (336) 832-4777   °[facility] [address], °[place] (336) 271-4999   °[facility]    (336) 373-0678   °[facility]    (336) 272-1050   °[facility] ° [address], [place] Phone:  (336) 832-4444, Fax:  (336) 832-4440 Hours of Operation:  9 am - 6 pm, M-F.  Also accepts Medicaid/Medicare and self-pay.  °[facility] ° [address], [place] Phone: (336) 832-3150, Fax: (336) [phone]. Hours of Operation:  8:30 am - 5:30 pm, M-F.  Also accepts Medicaid and self-pay.  °[facility] [place] [address], [place] Phone: (336) 878-6027   °[facility] [address], [place], [place] (336)723-1848, Ext. 123 Mondays & Thursdays: 7-9 AM.  First 15 patients are seen on a first come, first serve basis. °  ° °Medicaid-accepting [place] Providers: ° °Organization         Address  Phone   Notes  °[facility] [address], [place] (336) 641-2100 Also accepts self-pay patients.  °[facility] [address], [place] ° (336) 856-9996   °[facility] [address], [place]  (336) 288-8857   °[facility] [address] [place] [address], [place] (336) 299-7000   °Veita Bland [address], [place]  ° (336) 373-1557 Only accepts [place] Access Medicaid patients after they have their name applied to their card.  ° °Self-Pay (no insurance) in [place]: ° °Organization         Address  Phone   Notes  °Sickle Cell Patients, [facility] [address], [place] (336) 832-1970   °[facility] [address], [place] (336) 832-4400   °[facility] ° [address], [place] (336) 992-4800   °[facility]/Dr. Osei-Bonsu ° [address] [place] [address], [place] or [address], [place] (336) 841-8500 Phone number for both [place] and [place] locations is the same.  °[facility] [address], [place] (336) 299-0000   °[facility] [address] [place] [address], [place] or [address] (336) 852-7530 °(336) 878-2260   °[facility] [address], [place] (336) 350-1642, phone; (336) 294-5005, fax Sees patients 1st and 3rd Saturday of every month.  Must not qualify for public or private insurance (i.e. Medicaid, Medicare, [place] Health Choice, Veterans' Benefits) • Household income should be no more than 200% of the poverty level •The clinic cannot treat you if you are pregnant or think you are pregnant • Sexually transmitted diseases are not treated at the clinic.  ° ° °Dental Care: °Organization         Address  Phone  Notes  °[place] [facility] [address], [place] (336) 641-6152 Accepts children up to age 21 who are enrolled in Medicaid or [place] Health Choice; pregnant women with a Medicaid card; and children who have applied for Medicaid or [place] Health Choice, but were declined, whose parents can pay a reduced fee at time of service.  °[place]  [facility] [place]  [address], [place] (336) 641-7733 Accepts children up to age 21 who are enrolled in Medicaid or [place] Health Choice; pregnant women with a Medicaid card; and children who have applied for Medicaid or [place] Health Choice, but were declined, whose parents can pay a reduced fee at time of service.  °[facility] ° [address], [place] (336) 641-4533 Patients are seen by appointment only. Walk-ins are not accepted. [facility] will see patients 18 years of age and older. °Monday - Tuesday (8am-5pm) °Most Wednesdays (8:30-5pm) °$30 per visit, cash only  °[facility] ° [address], [place] (336) 641-4533 Patients are seen by appointment only. Walk-ins are not accepted. [facility] will see patients 18 years of age and older. °One   Wednesday Evening (Monthly: Volunteer Based).  $30 per visit, cash only  °[facility]  (919) 537-3737 for adults; Children under age 4, call Graduate Pediatric Dentistry at (919) [phone]. Children aged 4-14, please call (919) [phone] to request a pediatric application. ° Dental services are provided in all areas of dental care including fillings, crowns and bridges, complete and partial dentures, implants, gum treatment, root canals, and extractions. Preventive care is also provided. Treatment is provided to both adults and children. °Patients are selected via a lottery and there is often a waiting list. °  °[facility] [address], °[place] ° (336) 763-8833 [email or web address] °  °[facility] [address], [place], [place] (336)723-1848, Ext. 123 Second and Fourth Thursday of each month, opens at 6:30 AM; Clinic ends at 9 AM.  Patients are seen on a first-come first-served basis, and a limited number are seen during each clinic.  ° °[facility] ° [address], [place], [place] (336) 723-7904    Eligibility Requirements °You must have lived in [place], [place], or [place] counties for at least the last three months. °  You cannot be eligible for state or federal sponsored healthcare insurance, including Veterans Administration, Medicaid, or Medicare. °  You generally cannot be eligible for healthcare insurance through your employer.  °  How to apply: °Eligibility screenings are held every Tuesday and Wednesday afternoon from 1:00 pm until 4:00 pm. You do not need an appointment for the interview!  °[facility] [address], [place], [place] 336-631-2330   °[facility]  336-342-8273   °[facility]  336-703-3100   °[facility]  336-570-6415   ° °Behavioral Health Resources in the Community: °Intensive Outpatient Programs °Organization         Address  Phone  Notes  °[facility] [address], [place], [place] 336-878-6098   °[facility] [address], [place], [place] 336-832-9800   °[facility] [address], [place], [place] ° 336-882-2125   °[facility] [address],  °[place], [place] 1-800-853-5163 or 336-641-4981   °Substance Abuse Resources °Organization         Address  Phone  Notes  °[facility]  336-882-2125   °[facility]  336-784-9470   °[facility]  336-285-9073   °[facility]  336-845-3988   °Residential & Outpatient Substance Abuse Program  1-800-659-3381   °Psychological Services °Organization         Address  Phone  Notes  °[facility]  336- 832-9600   °[facility]  336- 378-7881   °[facility] [address], [place] 1-800-853-5163 or 336-641-4981   ° °Mobile Crisis Teams °Organization         Address  Phone  Notes  °[facility], Mobile Crisis Care Unit  1-877-626-1772   °Assertive °[facility] ° [address]  [place], [place] 336-834-9664   °Sharon DeEsch [address] °[place] 336-554-5454   ° °Self-Help/Support Groups °Organization         Address  Phone             Notes  °[facility] of  - variety of support groups  336- 373-1402 Call for more information  °Narcotics Anonymous (NA), [facility] [address], °[place]  2 meetings at this location  ° °  Residential Treatment Programs Organization         Address  Phone  Notes  [facility] [address],    [place]  [phone]   [facility]  [address], [place] [phone], [place], [place] [phone]   [facility] [address], [place] [phone] Admissions: 8am-3pm M-F  [facility] [address]    [place], [place] [phone]   [facility] [address], [place], [place] [phone]   [facility] [address]  [place], [place] [phone]   [facility] Intensive Outpatient [address], [place], [place] [phone]   [facility] [address]  [place], [place] [phone] or [phone]([phone]   [facility] [address] [place], [place] [phone] Accepts Medicaid  [facility] [address]  [place] [phone] Substance Abuse/Addiction Treatment   [facility]al [facility] Resources Organization         Address  Phone  Notes  [facility]  [phone](888) [phone]   Angie FavaJulie Brannon, PhD [address], [address] [place], [place]   ([phone](336) ([phone])[phone] or [phone](336) ([phone]   [facility]   [address] [place], [place] [phone](336) [phone]   [facility] [address], [place], [place] (705)[phone](336) [phone] Insurance/Medicaid/sponsorship through [facility] [address]                                    [place], [place] [phone](336) [phone] Therapy/tele-psych/case    [facility] [address]   [place], [place] [phone](336) [phone]    Dr. Lolly MustacheArfeen  ([phone](336) [phone]   [facility]  [employer] [facility] 1) [address], [place] 2) [address], [place] 3)  [address], [place] [phone](336) [phone](336) [phone]  [phone](336) [phone]   [facility] Child Abuse Hotline [phone](336) (770) 483-9555 or [phone](336) [phone])[phone] (After Hours)       Take the prescription as directed.  Call the General Surgeon tomorrow morning to schedule a follow up appointment for tomorrow. The office is expecting your call.  Return to the Emergency Department immediately sooner if worsening.

## 2014-01-20 NOTE — ED Notes (Signed)
[facility] ingu[facility].  Pt reports L sided groin pain and swelling x 2 weeks. Pain worse over the past few days with difficulty starting urine stream.

## 2014-01-20 NOTE — ED Notes (Signed)
Reports left sided groin pain with swelling.  X 2 wks. States over the past week it has become more severe and is having trouble with getting urine stream started.  No relief with otc meds.

## 2014-01-20 NOTE — ED Provider Notes (Signed)
CSN: [id number]     Arrival date & time 01/20/14  1508 History   First MD Initiated Contact with Patient 01/20/14 1528     Chief Complaint  Patient presents with  . Groin Pain   (Consider location/radiation/quality/duration/timing/severity/associated sxs/prior Treatment) Patient is a 48 y.o. male presenting with groin pain. The history is provided by the patient.  Groin Pain This is a new problem. The current episode started more than 2 days ago (felt sl pain and swelling to left ing area 2 wks ago which resolved but recurred 5d ago more constant and increased severity, with assoc diff urinating.). The problem has been gradually worsening. Associated symptoms include abdominal pain.    Past Medical History  Diagnosis Date  . Kidney stones   . Kidney stone   . Back injury    Past Surgical History  Procedure Laterality Date  . Rhinoplasty     History reviewed. No pertinent family history. History  Substance Use Topics  . Smoking status: Never Smoker   . Smokeless tobacco: Not on file  . Alcohol Use: No    Review of Systems  Constitutional: Negative.   Gastrointestinal: Positive for abdominal pain.  Genitourinary: Positive for difficulty urinating. Negative for flank pain, penile swelling, scrotal swelling, penile pain and testicular pain.    Allergies  Vicodin  Home Medications   Prior to Admission medications   Medication Sig Start Date End Date Taking? Authorizing Provider  oxyCODONE-acetaminophen (PERCOCET/ROXICET) 5-325 MG per tablet Take 1 tablet by mouth every 4 (four) hours as needed for pain. 10/25/12  Yes Roma KayserKatherine P Schorr, NP  acetaminophen (TYLENOL) 500 MG tablet Take 500 mg by mouth every 6 (six) hours as needed.    Historical Provider, MD   BP 151/95  Pulse 95  Temp(Src) 99.5 F (37.5 C) (Oral)  Resp 14  SpO2 100% Physical Exam  Nursing note and vitals reviewed. Constitutional: He is oriented to person, place, and time. He appears well-developed and  well-nourished.  Abdominal: Soft. Bowel sounds are normal. He exhibits mass. He exhibits no distension. There is tenderness. There is no rebound and no guarding. A hernia is present. Hernia confirmed positive in the left inguinal area.    Neurological: He is alert and oriented to person, place, and time.  Skin: Skin is warm and dry.    ED Course  Procedures (including critical care time) Labs Review Labs Reviewed - No data to display  Imaging Review No results found.   MDM   1. Inguinal hernia, left    Sent for surgical eval of acute incarc hernia.    Linna HoffJames D Javeah Loeza, MD 01/20/14 [phone]

## 2014-01-21 ENCOUNTER — Telehealth (INDEPENDENT_AMBULATORY_CARE_PROVIDER_SITE_OTHER): Payer: Self-pay

## 2014-01-21 NOTE — Telephone Encounter (Signed)
Called and spoke to patient regarding appointment scheduled for 01/22/14 @ 12:00pm w/Dr. Derrell Lollingamirez.  Patient to arrive 11:40 am for registration.

## 2014-01-22 ENCOUNTER — Encounter (HOSPITAL_COMMUNITY): Payer: Self-pay | Admitting: Pharmacy Technician

## 2014-01-22 ENCOUNTER — Encounter (INDEPENDENT_AMBULATORY_CARE_PROVIDER_SITE_OTHER): Payer: Self-pay | Admitting: General Surgery

## 2014-01-22 ENCOUNTER — Ambulatory Visit (INDEPENDENT_AMBULATORY_CARE_PROVIDER_SITE_OTHER): Payer: Commercial Managed Care - PPO | Admitting: General Surgery

## 2014-01-22 VITALS — BP 142/90 | HR 76 | Resp 16 | Ht 71.0 in | Wt 183.8 lb

## 2014-01-22 DIAGNOSIS — K4090 Unilateral inguinal hernia, without obstruction or gangrene, not specified as recurrent: Secondary | ICD-10-CM

## 2014-01-22 MED ORDER — OXYCODONE-ACETAMINOPHEN 5-325 MG PO TABS: ORAL_TABLET | ORAL | Status: DC

## 2014-01-22 NOTE — Progress Notes (Signed)
Patient ID: Joseph Ayala, male   DOB: 29-Dec-1965, 48 y.o.   MRN: [id number]  Chief Complaint  Patient presents with  . Inguinal Hernia    new pt    HPI Joseph Ayala is a 48 y.o. male.  The patient is a 48 year old male who is [facility] secondary to incarcerated left inguinal hernia. Patient does reduce in the ER. He states he did have trouble with the last one to 2 months. 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  HPI  Past Medical History  Diagnosis Date  . Kidney stones   . Kidney stone   . Back injury     Past Surgical History  Procedure Laterality Date  . Rhinoplasty      History reviewed. No pertinent family history.  Social History History  Substance Use Topics  . Smoking status: Never Smoker   . Smokeless tobacco: Not on file  . Alcohol Use: Yes    Allergies  Allergen Reactions  . Vicodin [Hydrocodone-Acetaminophen] Nausea And Vomiting    Current Outpatient Prescriptions  Medication Sig Dispense Refill  . testosterone cypionate (DEPO-TESTOSTERONE) 200 MG/ML injection Inject 200 mg into the muscle every 14 (fourteen) days.       No current facility-administered medications for this visit.    Review of Systems Review of Systems  Constitutional: Negative.   HENT: Negative.   Eyes: Negative.   Respiratory: Negative.   Cardiovascular: Negative.   Gastrointestinal: Negative.   Endocrine: Negative.   Neurological: Negative.     Blood pressure 142/90, pulse 76, resp. rate 16, height 5\' 11"  (1.803 m), weight 183 lb 12.8 oz (83.371 kg).  Physical Exam Physical Exam  Constitutional: He is oriented to person, place, and time. He appears well-developed and well-nourished.  HENT:  Head: Normocephalic and atraumatic.  Eyes: Conjunctivae and EOM are normal. Pupils are equal, round, and reactive to light.  Neck: Normal range of motion. Neck supple.  Cardiovascular: Normal rate, regular rhythm and normal  heart sounds.   Pulmonary/Chest: Effort normal and breath sounds normal.  Abdominal: Soft. Bowel sounds are normal. He exhibits no distension and no mass. There is no tenderness. There is no rebound and no guarding. A hernia is present. Hernia confirmed positive in the right inguinal area (left weakness) and confirmed positive in the left inguinal area.  Musculoskeletal: Normal range of motion.  Neurological: He is alert and oriented to person, place, and time.  Skin: Skin is warm and dry.    Data Reviewed none  Assessment    48 year old male with left inguinal hernia, likely direct as well as right-sided  Inguinal floor  weakness.     Plan    1. We'll proceed to the operating room for laparoscopic bilateral inguinal hernia repair with mesh 2.All risks and benefits were discussed with the patient, to generally include infection, bleeding, damage to surrounding structures, acute and chronic nerve pain, and recurrence. Alternatives were offered and described.  All questions were answered and the patient voiced understanding of the procedure and wishes to proceed at this point.         Marigene Ehlersamirez Jr., Joseph Ayala 01/22/2014, 12:11 PM

## 2014-01-22 NOTE — Addendum Note (Signed)
Addended by: Maryan PulsMOORE, Seniyah Esker on: 01/22/2014 12:29 PM   Modules accepted: Orders

## 2014-01-25 ENCOUNTER — Encounter (HOSPITAL_COMMUNITY): Payer: Self-pay | Admitting: Vascular Surgery

## 2014-01-27 ENCOUNTER — Other Ambulatory Visit (HOSPITAL_COMMUNITY): Payer: 59

## 2014-01-29 ENCOUNTER — Encounter (HOSPITAL_COMMUNITY): Admission: RE | Payer: Self-pay | Source: Ambulatory Visit

## 2014-01-29 ENCOUNTER — Ambulatory Visit (HOSPITAL_COMMUNITY): Admission: RE | Admit: 2014-01-29 | Payer: 59 | Source: Ambulatory Visit | Admitting: General Surgery

## 2014-01-29 SURGERY — REPAIR, HERNIA, INGUINAL, LAPAROSCOPIC
Anesthesia: General

## 2014-12-07 ENCOUNTER — Emergency Department (HOSPITAL_COMMUNITY)
Admission: EM | Admit: 2014-12-07 | Discharge: 2014-12-07 | Disposition: A | Discharge status: Home / Self Care | Payer: Self-pay

## 2015-02-09 ENCOUNTER — Other Ambulatory Visit: Payer: Self-pay | Admitting: Internal Medicine

## 2015-02-09 ENCOUNTER — Ambulatory Visit
Admission: RE | Admit: 2015-02-09 | Discharge: 2015-02-09 | Disposition: A | Discharge status: Home / Self Care | Payer: 59 | Source: Ambulatory Visit | Attending: Internal Medicine | Admitting: Internal Medicine

## 2015-02-09 DIAGNOSIS — M795 Residual foreign body in soft tissue: Secondary | ICD-10-CM

## 2015-02-09 IMAGING — CR DG FINGER INDEX 2+V*L*
3 series · 3 of 3 positions shown · non-contrast
Comparison: None.

CLINICAL DATA: Left index finger pain and swelling worsening over
last 2 weeks. Question foreign body at tip.

EXAM:
LEFT INDEX FINGER 2+V

[x finger pa left]
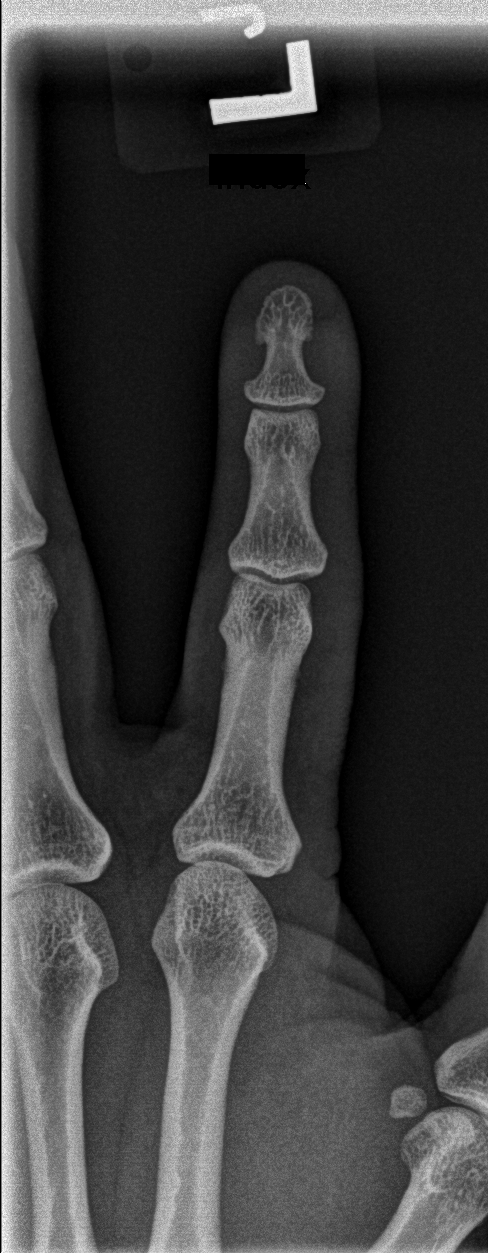

[x finger obl left]
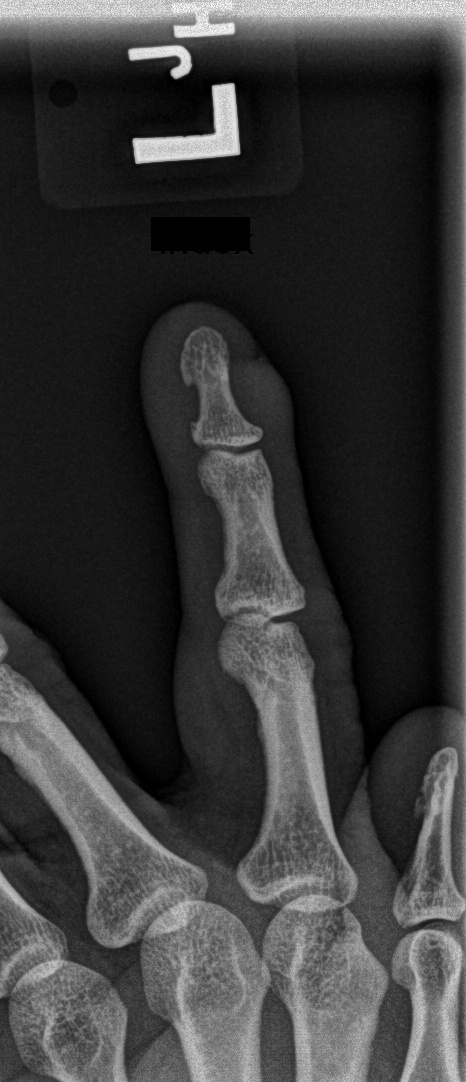

[x finger lat left]
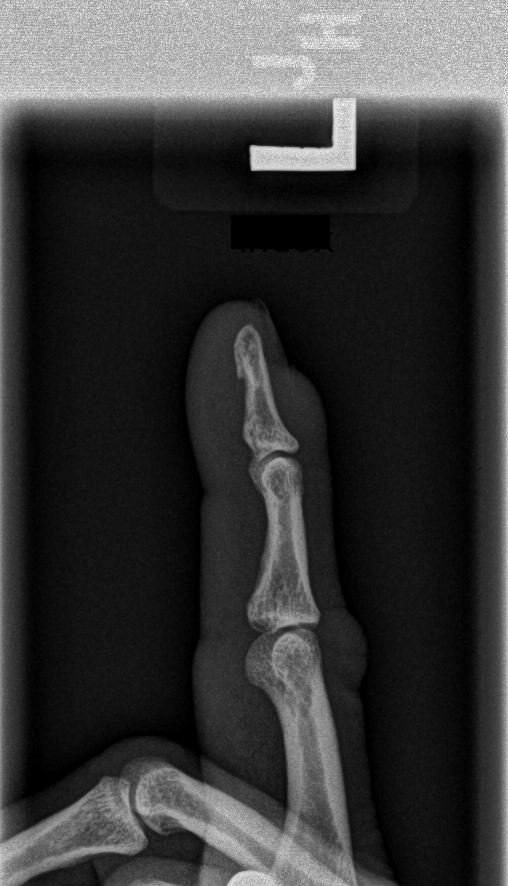

[3 of 3 positions shown; findings below may reference images not displayed]

FINDINGS: No acute bony abnormality. Specifically, no fracture, subluxation,
or dislocation. Soft tissues are intact. No radiopaque foreign
bodies. Soft tissue swelling noted at the base of the left index
finger nail.
IMPRESSION: No acute bony abnormality.

## 2015-07-12 DIAGNOSIS — F1120 Opioid dependence, uncomplicated: Secondary | ICD-10-CM | POA: Diagnosis not present

## 2015-07-13 DIAGNOSIS — F1120 Opioid dependence, uncomplicated: Secondary | ICD-10-CM | POA: Diagnosis not present

## 2015-07-14 DIAGNOSIS — F1120 Opioid dependence, uncomplicated: Secondary | ICD-10-CM | POA: Diagnosis not present

## 2015-07-18 DIAGNOSIS — F1120 Opioid dependence, uncomplicated: Secondary | ICD-10-CM | POA: Diagnosis not present

## 2015-07-19 DIAGNOSIS — F1120 Opioid dependence, uncomplicated: Secondary | ICD-10-CM | POA: Diagnosis not present

## 2015-07-20 DIAGNOSIS — F1120 Opioid dependence, uncomplicated: Secondary | ICD-10-CM | POA: Diagnosis not present

## 2015-07-21 DIAGNOSIS — F1120 Opioid dependence, uncomplicated: Secondary | ICD-10-CM | POA: Diagnosis not present

## 2015-07-27 DIAGNOSIS — F1120 Opioid dependence, uncomplicated: Secondary | ICD-10-CM | POA: Diagnosis not present

## 2015-07-28 DIAGNOSIS — F1120 Opioid dependence, uncomplicated: Secondary | ICD-10-CM | POA: Diagnosis not present

## 2015-07-28 MED FILL — DEPO-TESTOSTERONE 200 MG/ML: 200 | 84 days supply | Qty: 12 | Fill #1

## 2015-07-29 DIAGNOSIS — F1120 Opioid dependence, uncomplicated: Secondary | ICD-10-CM | POA: Diagnosis not present

## 2015-08-01 DIAGNOSIS — F1120 Opioid dependence, uncomplicated: Secondary | ICD-10-CM | POA: Diagnosis not present

## 2015-08-02 DIAGNOSIS — F1120 Opioid dependence, uncomplicated: Secondary | ICD-10-CM | POA: Diagnosis not present

## 2015-08-03 DIAGNOSIS — F1120 Opioid dependence, uncomplicated: Secondary | ICD-10-CM | POA: Diagnosis not present

## 2015-08-04 DIAGNOSIS — F1120 Opioid dependence, uncomplicated: Secondary | ICD-10-CM | POA: Diagnosis not present

## 2015-08-05 DIAGNOSIS — F1120 Opioid dependence, uncomplicated: Secondary | ICD-10-CM | POA: Diagnosis not present

## 2015-08-06 DIAGNOSIS — F1120 Opioid dependence, uncomplicated: Secondary | ICD-10-CM | POA: Diagnosis not present

## 2015-08-07 DIAGNOSIS — F1120 Opioid dependence, uncomplicated: Secondary | ICD-10-CM | POA: Diagnosis not present

## 2015-08-08 DIAGNOSIS — F1120 Opioid dependence, uncomplicated: Secondary | ICD-10-CM | POA: Diagnosis not present

## 2015-08-09 DIAGNOSIS — F1120 Opioid dependence, uncomplicated: Secondary | ICD-10-CM | POA: Diagnosis not present

## 2015-08-10 DIAGNOSIS — F1120 Opioid dependence, uncomplicated: Secondary | ICD-10-CM | POA: Diagnosis not present

## 2015-08-11 DIAGNOSIS — F1120 Opioid dependence, uncomplicated: Secondary | ICD-10-CM | POA: Diagnosis not present

## 2015-08-12 DIAGNOSIS — F1120 Opioid dependence, uncomplicated: Secondary | ICD-10-CM | POA: Diagnosis not present

## 2015-08-13 DIAGNOSIS — F1120 Opioid dependence, uncomplicated: Secondary | ICD-10-CM | POA: Diagnosis not present

## 2015-08-14 DIAGNOSIS — F1120 Opioid dependence, uncomplicated: Secondary | ICD-10-CM | POA: Diagnosis not present

## 2015-08-15 DIAGNOSIS — F1120 Opioid dependence, uncomplicated: Secondary | ICD-10-CM | POA: Diagnosis not present

## 2015-08-16 DIAGNOSIS — F1120 Opioid dependence, uncomplicated: Secondary | ICD-10-CM | POA: Diagnosis not present

## 2015-08-17 DIAGNOSIS — F1120 Opioid dependence, uncomplicated: Secondary | ICD-10-CM | POA: Diagnosis not present

## 2015-08-18 ENCOUNTER — Encounter (HOSPITAL_COMMUNITY): Payer: Self-pay | Admitting: Emergency Medicine

## 2015-08-18 ENCOUNTER — Emergency Department (HOSPITAL_COMMUNITY)
Admission: EM | Admit: 2015-08-18 | Discharge: 2015-08-18 | Disposition: A | Discharge status: Home / Self Care | Payer: 59 | Attending: Emergency Medicine | Admitting: Emergency Medicine

## 2015-08-18 DIAGNOSIS — Z87828 Personal history of other (healed) physical injury and trauma: Secondary | ICD-10-CM | POA: Diagnosis not present

## 2015-08-18 DIAGNOSIS — I8390 Asymptomatic varicose veins of unspecified lower extremity: Secondary | ICD-10-CM

## 2015-08-18 DIAGNOSIS — I83891 Varicose veins of right lower extremities with other complications: Secondary | ICD-10-CM | POA: Insufficient documentation

## 2015-08-18 DIAGNOSIS — Z87442 Personal history of urinary calculi: Secondary | ICD-10-CM | POA: Insufficient documentation

## 2015-08-18 DIAGNOSIS — I8391 Asymptomatic varicose veins of right lower extremity: Secondary | ICD-10-CM | POA: Diagnosis not present

## 2015-08-18 DIAGNOSIS — F1120 Opioid dependence, uncomplicated: Secondary | ICD-10-CM | POA: Diagnosis not present

## 2015-08-18 NOTE — Discharge Instructions (Signed)
Varicose Veins Varicose veins are veins that have become enlarged and twisted. They are usually seen in the legs but can occur in other parts of the body as well. CAUSES This condition is the result of valves in the veins not working properly. Valves in the veins help to return blood from the leg to the heart. If these valves are damaged, blood flows backward and backs up into the veins in the leg near the skin. This causes the veins to become larger. RISK FACTORS People who are on their feet a lot, who are pregnant, or who are overweight are more likely to develop varicose veins. SIGNS AND SYMPTOMS  Bulging, twisted-appearing, bluish veins, most commonly found on the legs.  Leg pain or a feeling of heaviness. These symptoms may be worse at the end of the day.  Leg swelling.  Changes in skin color. DIAGNOSIS A health care provider can usually diagnose varicose veins by examining your legs. Your health care provider may also recommend an ultrasound of your leg veins. TREATMENT Most varicose veins can be treated at home.However, other treatments are available for people who have persistent symptoms or want to improve the cosmetic appearance of the varicose veins. These treatment options include:  Sclerotherapy. A solution is injected into the vein to close it off.  Laser treatment. A laser is used to heat the vein to close it off.  Radiofrequency vein ablation. An electrical current produced by radio waves is used to close off the vein.  Phlebectomy. The vein is surgically removed through small incisions made over the varicose vein.  Vein ligation and stripping. The vein is surgically removed through incisions made over the varicose vein after the vein has been tied (ligated). HOME CARE INSTRUCTIONS  Do not stand or sit in one position for long periods of time. Do not sit with your legs crossed. Rest with your legs raised during the day.  Wear compression stockings as directed by your  health care provider. These stockings help to prevent blood clots and reduce swelling in your legs.  Do not wear other tight, encircling garments around your legs, pelvis, or waist.  Walk as much as possible to increase blood flow.  Raise the foot of your bed at night with 2-inch blocks.  If you get a cut in the skin over the vein and the vein bleeds, lie down with your leg raised and press on it with a clean cloth until the bleeding stops. Then place a bandage (dressing) on the cut. See your health care provider if it continues to bleed. SEEK MEDICAL CARE IF:  The skin around your ankle starts to break down.  You have pain, redness, tenderness, or hard swelling in your leg over a vein.  You are uncomfortable because of leg pain.   This information is not intended to replace advice given to you by your health care provider. Make sure you discuss any questions you have with your health care provider.   Document Released: 04/04/2005 Document Revised: 07/16/2014 Document Reviewed: 11/10/2013 Elsevier Interactive Patient Education 2016 Elsevier Inc.  

## 2015-08-18 NOTE — ED Notes (Signed)
Pt leg wrapped with a pressure dressing. No bleeding noted at this time.

## 2015-08-18 NOTE — ED Provider Notes (Signed)
CSN: [id number]     Arrival date & time 08/18/15  1958 History  By signing my name below, I, Lyndel Safe, attest that this documentation has been prepared under the direction and in the presence of [employer], PA-C. Electronically Signed: Lyndel Safe, ED Scribe. 08/18/2015. 8:45 PM.  Chief Complaint  Patient presents with  . Varicose Veins   The history is provided by the patient. No language interpreter was used.   HPI Comments: Joseph Ayala is a 50 y.o. male who presents to the Emergency Department complaining of  bleeding from a varicose vein to medial right lower leg that has been constant s/p scratching the area last night. Bleeding is controlled with pressure dressing currently. He is not on any anticoagulants. No h/o DVT/PE.   Past Medical History  Diagnosis Date  . Kidney stones   . Kidney stone   . Back injury    Past Surgical History  Procedure Laterality Date  . Rhinoplasty     No family history on file. Social History  Substance Use Topics  . Smoking status: Never Smoker   . Smokeless tobacco: None  . Alcohol Use: Yes    Review of Systems  Skin: Positive for wound (bleeding varicose vein to right lower leg ).  Hematological: Does not bruise/bleed easily.   Allergies  Vicodin  Home Medications   Prior to Admission medications   Medication Sig Start Date End Date Taking? Authorizing Provider  oxyCODONE-acetaminophen (ROXICET) 5-325 MG per tablet Take 1-2 tablets by mouth every 4 hours as needed for pain 01/22/14   [name], MD  testosterone cypionate (DEPO-TESTOSTERONE) 200 MG/ML injection Inject 200 mg into the muscle every 14 (fourteen) days.    Historical Provider, MD   BP 141/101 mmHg  Pulse 103  Temp(Src) 97.5 F (36.4 C) (Oral)  Resp 16  Ht  (1.803 m)  Wt 204 lb (92.534 kg)  BMI 28.46 kg/m2  SpO2 98% Physical Exam  Constitutional: He is oriented to person, place, and time. He appears well-developed and well-nourished. No  distress.  HENT:  Head: Normocephalic.  Eyes: Conjunctivae are normal.  Neck: Normal range of motion. Neck supple.  Cardiovascular: Normal rate, regular rhythm and normal heart sounds.   Pulmonary/Chest: Effort normal and breath sounds normal. No respiratory distress. He has no wheezes.  Musculoskeletal: Normal range of motion.  Neurological: He is alert and oriented to person, place, and time. Coordination normal.  Skin: Skin is warm.  Varicose vein to medial right lower leg that is actively bleeding a small amount.   Psychiatric: He has a normal mood and affect. His behavior is normal.  Nursing note and vitals reviewed.   ED Course  Procedures  DIAGNOSTIC STUDIES: Oxygen Saturation is 98% on RA, normal by my interpretation.    COORDINATION OF CARE: 8:44 PM Discussed treatment plan with pt at bedside and pt agreed to plan.  9:08 PM Bleeding resolved spontaneously while in the ED and pressure dressing applied. Pt is stable for discharge and is agreeable to plan.    MDM   Final diagnoses:  None  Patient presents today with a bleeding varicose vein.  Bleeding subsided in the ED after applying pressure dressing.  Feel that the patient is stable for discharge.  Patient requested referral to Vascular Surgery.  Patient given referral.  Stable for discharge.  Return precautions given. I personally performed the services described in this documentation, which was scribed in my presence. The recorded information has been reviewed and  is accurate.   Santiago Glad, PA-C 08/18/15 2227  Mancel Bale, MD 08/19/15 [phone]

## 2015-08-18 NOTE — ED Notes (Signed)
Pt states he had a knot in a blood vessel in his lower leg and was told it was a blood clot. Last night pt scratched his leg and his vein busted and was bleeding. Since pt has had lower leg wrapped with pressure dressing. Pt denies any pain.

## 2015-08-19 DIAGNOSIS — F1120 Opioid dependence, uncomplicated: Secondary | ICD-10-CM | POA: Diagnosis not present

## 2015-08-20 DIAGNOSIS — F1120 Opioid dependence, uncomplicated: Secondary | ICD-10-CM | POA: Diagnosis not present

## 2015-08-21 DIAGNOSIS — F1120 Opioid dependence, uncomplicated: Secondary | ICD-10-CM | POA: Diagnosis not present

## 2015-08-22 DIAGNOSIS — F1120 Opioid dependence, uncomplicated: Secondary | ICD-10-CM | POA: Diagnosis not present

## 2015-08-23 ENCOUNTER — Encounter: Payer: Self-pay | Admitting: Vascular Surgery

## 2015-08-23 DIAGNOSIS — F1120 Opioid dependence, uncomplicated: Secondary | ICD-10-CM | POA: Diagnosis not present

## 2015-08-24 DIAGNOSIS — F1120 Opioid dependence, uncomplicated: Secondary | ICD-10-CM | POA: Diagnosis not present

## 2015-08-25 ENCOUNTER — Encounter: Payer: Self-pay | Admitting: Vascular Surgery

## 2015-08-25 ENCOUNTER — Ambulatory Visit (INDEPENDENT_AMBULATORY_CARE_PROVIDER_SITE_OTHER): Payer: 59 | Admitting: Vascular Surgery

## 2015-08-25 VITALS — BP 130/88 | HR 95 | Temp 97.0°F | Resp 18 | Ht 70.5 in | Wt 216.3 lb

## 2015-08-25 DIAGNOSIS — I83891 Varicose veins of right lower extremities with other complications: Secondary | ICD-10-CM | POA: Diagnosis not present

## 2015-08-25 DIAGNOSIS — F1120 Opioid dependence, uncomplicated: Secondary | ICD-10-CM | POA: Diagnosis not present

## 2015-08-25 NOTE — Addendum Note (Signed)
Addended by: Adria Dill L on: 08/25/2015 04:47 PM   Modules accepted: Orders

## 2015-08-25 NOTE — Progress Notes (Signed)
Vascular and Vein Specialist of [facility]  Patient name: Joseph Ayala MRN: [id number] DOB: [birth year] Sex: male  REASON FOR CONSULT:  Discussion of right leg venous pathology  HPI: Joseph Ayala is a 50 y.o. male, who is  HEENT today for discussion of right leg venous pathology. He has a long history of extensive varicosities in his right leg. Had some thickening in these areas. Recently he had an episode where he had bleeding from medial calf superficial varix. Initially treated this with the a pressure dressing but continued to bleed and was seen in the ER. He is referred to [place] for further discussion. Does have a history of extensive varicosities but no history of DVT. Family history is positive for treatment of the varicosities in her brother. His past history is otherwise unremarkable.  Past Medical History  Diagnosis Date  . Kidney stones   . Kidney stone   . Back injury     Family History  Problem Relation Age of Onset  . Varicose Veins Brother     SOCIAL HISTORY: Social History   Social History  . Marital Status: Married    Spouse Name: N/A  . Number of Children: N/A  . Years of Education: N/A   Occupational History  . Not on file.   Social History Main Topics  . Smoking status: Never Smoker   . Smokeless tobacco: Not on file  . Alcohol Use: No  . Drug Use: No  . Sexual Activity: Yes   Other Topics Concern  . Not on file   Social History Narrative    Allergies  Allergen Reactions  . Vicodin [Hydrocodone-Acetaminophen] Nausea And Vomiting    Current Outpatient Prescriptions  Medication Sig Dispense Refill  . testosterone cypionate (DEPO-TESTOSTERONE) 200 MG/ML injection Inject 200 mg into the muscle every 14 (fourteen) days.    Marland Kitchen oxyCODONE-acetaminophen (ROXICET) 5-325 MG per tablet Take 1-2 tablets by mouth every 4 hours as needed for pain (Patient not taking: Reported on 08/25/2015) 20 tablet 0   No current facility-administered medications for  this visit.    REVIEW OF SYSTEMS:  [X]  denotes positive finding, [ ]  denotes negative finding Cardiac  Comments:  Chest pain or chest pressure:    Shortness of breath upon exertion:    Short of breath when lying flat:    Irregular heart rhythm:        Vascular    Pain in calf, thigh, or hip brought on by ambulation:    Pain in feet at night that wakes you up from your sleep:     Blood clot in your veins:    Leg swelling:         Pulmonary    Oxygen at home:    Productive cough:     Wheezing:         Neurologic    Sudden weakness in arms or legs:     Sudden numbness in arms or legs:     Sudden onset of difficulty speaking or slurred speech:    Temporary loss of vision in one eye:     Problems with dizziness:         Gastrointestinal    Blood in stool:     Vomited blood:         Genitourinary    Burning when urinating:     Blood in urine:        Psychiatric    Major depression:  Hematologic    Bleeding problems:    Problems with blood clotting too easily:        Skin    Rashes or ulcers:        Constitutional    Fever or chills:      PHYSICAL EXAM: Filed Vitals:   08/25/15 0904 08/25/15 0907  BP: 134/92 130/88  Pulse: 95   Temp: 97 F (36.1 C)   TempSrc: Oral   Resp: 18   Height: 5' 10.5" (1.791 m)   Weight: 216 lb 4.8 oz (98.113 kg)   SpO2: 95%     GENERAL: The patient is a well-nourished male, in no acute distress. The vital signs are documented above. VASCULAR:  2+ radial pulses bilaterally MUSCULOSKELETAL: There are no major deformities or cyanosis. NEUROLOGIC: No focal weakness or paresthesias are detected. SKIN: There are no ulcers or rashes noted. PSYCHIATRIC: The patient has a normal affect.  he does have a very large varicosities extending throughout his medial right thigh and into his calf. Does have the area with a small eschar present where he had bleeding. His left leg is noted for prominent saphenous vein and the skin but no  large varicosities  DATA:   he did not undergo formal venous duplex with [place] today. I did image his veins with SonoSite ultrasound. This does show markedly enlarged saphenous vein throughout its course with reflux and a very large tributary varicosities in his thigh and calf  MEDICAL ISSUES:  had long discussion with patient explaining the significance of his varicosities with a bleeding from these. He also has pain associated with varicosities with prolonged standing. He was fitted today with a thigh-high graduated compression garments, 20-30 mmHg. Also explain the importance of elevation when possible and ibuprofen for discomfort. We will see him again for further evaluation and 3 months. Will undergo formal venous duplex at that time. Do feel he would be an excellent candidate for laser ablation of his great saphenous vein and stab phlebectomy of tributary varicosities should he have continued pain. We will see him again in 3 months   Kaien Pezzullo [facility] [place] [facility]: [phone]

## 2015-08-25 NOTE — Progress Notes (Signed)
.   Filed Vitals:   08/25/15 0904 08/25/15 0907  BP: 134/92 130/88  Pulse: 95   Temp: 97 F (36.1 C)   TempSrc: Oral   Resp: 18   Height: 5' 10.5" (1.791 m)   Weight: 216 lb 4.8 oz (98.113 kg)   SpO2: 95%

## 2015-08-26 DIAGNOSIS — F1120 Opioid dependence, uncomplicated: Secondary | ICD-10-CM | POA: Diagnosis not present

## 2015-08-26 NOTE — Addendum Note (Signed)
Addended by: Adria Dill L on: 08/26/2015 09:49 AM   Modules accepted: Orders

## 2015-08-27 DIAGNOSIS — F1120 Opioid dependence, uncomplicated: Secondary | ICD-10-CM | POA: Diagnosis not present

## 2015-08-28 DIAGNOSIS — F1120 Opioid dependence, uncomplicated: Secondary | ICD-10-CM | POA: Diagnosis not present

## 2015-08-29 DIAGNOSIS — F1120 Opioid dependence, uncomplicated: Secondary | ICD-10-CM | POA: Diagnosis not present

## 2015-08-30 DIAGNOSIS — F1120 Opioid dependence, uncomplicated: Secondary | ICD-10-CM | POA: Diagnosis not present

## 2015-08-31 DIAGNOSIS — F1120 Opioid dependence, uncomplicated: Secondary | ICD-10-CM | POA: Diagnosis not present

## 2015-09-01 DIAGNOSIS — F1120 Opioid dependence, uncomplicated: Secondary | ICD-10-CM | POA: Diagnosis not present

## 2015-09-05 DIAGNOSIS — F1120 Opioid dependence, uncomplicated: Secondary | ICD-10-CM | POA: Diagnosis not present

## 2015-09-06 DIAGNOSIS — F1120 Opioid dependence, uncomplicated: Secondary | ICD-10-CM | POA: Diagnosis not present

## 2015-09-07 DIAGNOSIS — F1120 Opioid dependence, uncomplicated: Secondary | ICD-10-CM | POA: Diagnosis not present

## 2015-09-09 DIAGNOSIS — F1120 Opioid dependence, uncomplicated: Secondary | ICD-10-CM | POA: Diagnosis not present

## 2015-09-12 DIAGNOSIS — F1120 Opioid dependence, uncomplicated: Secondary | ICD-10-CM | POA: Diagnosis not present

## 2015-09-13 DIAGNOSIS — F1120 Opioid dependence, uncomplicated: Secondary | ICD-10-CM | POA: Diagnosis not present

## 2015-09-14 DIAGNOSIS — F1120 Opioid dependence, uncomplicated: Secondary | ICD-10-CM | POA: Diagnosis not present

## 2015-09-15 DIAGNOSIS — F1120 Opioid dependence, uncomplicated: Secondary | ICD-10-CM | POA: Diagnosis not present

## 2015-09-16 DIAGNOSIS — F1120 Opioid dependence, uncomplicated: Secondary | ICD-10-CM | POA: Diagnosis not present

## 2015-09-26 DIAGNOSIS — F1120 Opioid dependence, uncomplicated: Secondary | ICD-10-CM | POA: Diagnosis not present

## 2015-09-27 DIAGNOSIS — F1120 Opioid dependence, uncomplicated: Secondary | ICD-10-CM | POA: Diagnosis not present

## 2015-09-28 DIAGNOSIS — F1120 Opioid dependence, uncomplicated: Secondary | ICD-10-CM | POA: Diagnosis not present

## 2015-09-29 DIAGNOSIS — F1120 Opioid dependence, uncomplicated: Secondary | ICD-10-CM | POA: Diagnosis not present

## 2015-09-30 DIAGNOSIS — F1120 Opioid dependence, uncomplicated: Secondary | ICD-10-CM | POA: Diagnosis not present

## 2015-10-02 DIAGNOSIS — F1120 Opioid dependence, uncomplicated: Secondary | ICD-10-CM | POA: Diagnosis not present

## 2015-10-03 DIAGNOSIS — F1120 Opioid dependence, uncomplicated: Secondary | ICD-10-CM | POA: Diagnosis not present

## 2015-10-04 DIAGNOSIS — F1120 Opioid dependence, uncomplicated: Secondary | ICD-10-CM | POA: Diagnosis not present

## 2015-10-05 DIAGNOSIS — F1120 Opioid dependence, uncomplicated: Secondary | ICD-10-CM | POA: Diagnosis not present

## 2015-10-06 DIAGNOSIS — F1120 Opioid dependence, uncomplicated: Secondary | ICD-10-CM | POA: Diagnosis not present

## 2015-10-07 DIAGNOSIS — F1120 Opioid dependence, uncomplicated: Secondary | ICD-10-CM | POA: Diagnosis not present

## 2015-10-08 DIAGNOSIS — F1120 Opioid dependence, uncomplicated: Secondary | ICD-10-CM | POA: Diagnosis not present

## 2015-10-09 DIAGNOSIS — F1120 Opioid dependence, uncomplicated: Secondary | ICD-10-CM | POA: Diagnosis not present

## 2015-10-10 DIAGNOSIS — F1120 Opioid dependence, uncomplicated: Secondary | ICD-10-CM | POA: Diagnosis not present

## 2015-10-11 DIAGNOSIS — F1120 Opioid dependence, uncomplicated: Secondary | ICD-10-CM | POA: Diagnosis not present

## 2015-10-11 MED FILL — IBUPROFEN 800 MG TABLET: 800 | 30 days supply | Qty: 90 | Fill #5

## 2015-10-12 DIAGNOSIS — F1120 Opioid dependence, uncomplicated: Secondary | ICD-10-CM | POA: Diagnosis not present

## 2015-10-13 DIAGNOSIS — F1120 Opioid dependence, uncomplicated: Secondary | ICD-10-CM | POA: Diagnosis not present

## 2015-10-17 DIAGNOSIS — F1120 Opioid dependence, uncomplicated: Secondary | ICD-10-CM | POA: Diagnosis not present

## 2015-10-18 DIAGNOSIS — I868 Varicose veins of other specified sites: Secondary | ICD-10-CM | POA: Diagnosis not present

## 2015-10-18 DIAGNOSIS — E291 Testicular hypofunction: Secondary | ICD-10-CM | POA: Diagnosis not present

## 2015-10-18 DIAGNOSIS — F1120 Opioid dependence, uncomplicated: Secondary | ICD-10-CM | POA: Diagnosis not present

## 2015-10-18 MED FILL — DEPO-TESTOSTERONE 200 MG/ML: 200 | 84 days supply | Qty: 12 | Fill #0

## 2015-10-19 DIAGNOSIS — F1120 Opioid dependence, uncomplicated: Secondary | ICD-10-CM | POA: Diagnosis not present

## 2015-10-20 DIAGNOSIS — F1120 Opioid dependence, uncomplicated: Secondary | ICD-10-CM | POA: Diagnosis not present

## 2015-10-25 DIAGNOSIS — F1120 Opioid dependence, uncomplicated: Secondary | ICD-10-CM | POA: Diagnosis not present

## 2015-10-26 DIAGNOSIS — F1120 Opioid dependence, uncomplicated: Secondary | ICD-10-CM | POA: Diagnosis not present

## 2015-10-27 DIAGNOSIS — F1120 Opioid dependence, uncomplicated: Secondary | ICD-10-CM | POA: Diagnosis not present

## 2015-11-01 DIAGNOSIS — F1120 Opioid dependence, uncomplicated: Secondary | ICD-10-CM | POA: Diagnosis not present

## 2015-11-02 DIAGNOSIS — F1120 Opioid dependence, uncomplicated: Secondary | ICD-10-CM | POA: Diagnosis not present

## 2015-11-03 DIAGNOSIS — F1120 Opioid dependence, uncomplicated: Secondary | ICD-10-CM | POA: Diagnosis not present

## 2015-11-08 DIAGNOSIS — F1120 Opioid dependence, uncomplicated: Secondary | ICD-10-CM | POA: Diagnosis not present

## 2015-11-09 DIAGNOSIS — F1120 Opioid dependence, uncomplicated: Secondary | ICD-10-CM | POA: Diagnosis not present

## 2015-11-10 DIAGNOSIS — F1120 Opioid dependence, uncomplicated: Secondary | ICD-10-CM | POA: Diagnosis not present

## 2015-11-15 DIAGNOSIS — F1120 Opioid dependence, uncomplicated: Secondary | ICD-10-CM | POA: Diagnosis not present

## 2015-11-16 DIAGNOSIS — F1120 Opioid dependence, uncomplicated: Secondary | ICD-10-CM | POA: Diagnosis not present

## 2015-11-17 DIAGNOSIS — F1120 Opioid dependence, uncomplicated: Secondary | ICD-10-CM | POA: Diagnosis not present

## 2015-11-22 ENCOUNTER — Encounter: Payer: Self-pay | Admitting: Vascular Surgery

## 2015-11-22 DIAGNOSIS — F1120 Opioid dependence, uncomplicated: Secondary | ICD-10-CM | POA: Diagnosis not present

## 2015-11-23 DIAGNOSIS — F1120 Opioid dependence, uncomplicated: Secondary | ICD-10-CM | POA: Diagnosis not present

## 2015-11-24 DIAGNOSIS — F1120 Opioid dependence, uncomplicated: Secondary | ICD-10-CM | POA: Diagnosis not present

## 2015-11-29 ENCOUNTER — Encounter: Payer: Self-pay | Admitting: Vascular Surgery

## 2015-11-29 ENCOUNTER — Other Ambulatory Visit: Payer: Self-pay | Admitting: Vascular Surgery

## 2015-11-29 ENCOUNTER — Ambulatory Visit (HOSPITAL_COMMUNITY)
Admission: RE | Admit: 2015-11-29 | Discharge: 2015-11-29 | Disposition: A | Discharge status: Home / Self Care | Payer: 59 | Source: Ambulatory Visit | Attending: Vascular Surgery | Admitting: Vascular Surgery

## 2015-11-29 ENCOUNTER — Ambulatory Visit (INDEPENDENT_AMBULATORY_CARE_PROVIDER_SITE_OTHER): Payer: 59 | Admitting: Vascular Surgery

## 2015-11-29 VITALS — BP 125/85 | HR 71 | Temp 97.5°F | Resp 18 | Ht 70.5 in | Wt 212.5 lb

## 2015-11-29 DIAGNOSIS — I83891 Varicose veins of right lower extremities with other complications: Secondary | ICD-10-CM | POA: Insufficient documentation

## 2015-11-29 DIAGNOSIS — F1120 Opioid dependence, uncomplicated: Secondary | ICD-10-CM | POA: Diagnosis not present

## 2015-11-29 NOTE — Progress Notes (Signed)
Problems with Activities of Daily Living Secondary to Leg Pain  1. 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 Shifts) and this is very difficult due to leg pain.     2. Mr. Sandstrom states he has stopped exercising due to leg pain and swelling.    3. Mr. Offord states yard work is very difficult for him due to leg pain and swelling.     Failure of  Conservative Therapy:  1. Worn 20-30 mm Hg thigh high compression hose >3 months with no relief of symptoms.  2. Frequently elevates legs-no relief of symptoms  3. Taken Ibuprofen 600 Mg TID with no relief of symptoms.    Vascular and Vein Specialist of [facility]  Patient name: Durk Carmen Tilley MRN: [id number] DOB: 1966/05/07 Sex: male  REASON FOR VISIT: Follow-up of severe venous hypertension right leg with bleeding on 2 different occasions  HPI: Eloy T Kovacic is a 50 y.o. male here today for follow-up. Has had extensive venous hypertension is right leg and has had bleeding from calf varicosities on 2 separate occasions. He has been compliant with his compression garments but continues to have discomfort despite this. He did undergo formal venous duplex today and is here for discussion of this. His wife is present with him as well.  Past Medical History  Diagnosis Date  . Kidney stones   . Kidney stone   . Back injury   . Varicose veins     Family History  Problem Relation Age of Onset  . Varicose Veins Brother     SOCIAL HISTORY: Social History  Substance Use Topics  . Smoking status: Never Smoker   . Smokeless tobacco: Current User    Types: Snuff  . Alcohol Use: No    Allergies  Allergen Reactions  . Strawberry (Diagnostic)     hives  . Vicodin [Hydrocodone-Acetaminophen] Nausea And Vomiting    Current Outpatient Prescriptions  Medication Sig Dispense Refill  . testosterone cypionate (DEPO-TESTOSTERONE) 200 MG/ML injection Inject 200 mg into the muscle every 14 (fourteen) days.    Marland Kitchen  oxyCODONE-acetaminophen (ROXICET) 5-325 MG per tablet Take 1-2 tablets by mouth every 4 hours as needed for pain (Patient not taking: Reported on 08/25/2015) 20 tablet 0   No current facility-administered medications for this visit.    REVIEW OF SYSTEMS:   denotes positive finding,  denotes negative finding Cardiac  Comments:  Chest pain or chest pressure:    Shortness of breath upon exertion:    Short of breath when lying flat:    Irregular heart rhythm:        Vascular    Pain in calf, thigh, or hip brought on by ambulation:    Pain in feet at night that wakes you up from your sleep:     Blood clot in your veins:    Leg swelling:         Pulmonary    Oxygen at home:    Productive cough:     Wheezing:         Neurologic    Sudden weakness in arms or legs:     Sudden numbness in arms or legs:     Sudden onset of difficulty speaking or slurred speech:    Temporary loss of vision in one eye:     Problems with dizziness:         Gastrointestinal    Blood in stool:     Vomited blood:  Genitourinary    Burning when urinating:     Blood in urine:        Psychiatric    Major depression:         Hematologic    Bleeding problems:    Problems with blood clotting too easily:        Skin    Rashes or ulcers:        Constitutional    Fever or chills:      PHYSICAL EXAM: Filed Vitals:   11/29/15 1012  BP: 125/85  Pulse: 71  Temp: 97.5 F (36.4 C)  TempSrc: Oral  Resp: 18  Height: 5' 10.5" (1.791 m)  Weight: 212 lb 8 oz (96.389 kg)  SpO2: 97%    GENERAL: The patient is a well-nourished male, in no acute distress. The vital signs are documented above.  VASCULAR: 2+ dorsalis pedis pulses bilaterally PULMONARY: There is good air exchange  MUSCULOSKELETAL: There are no major deformities or cyanosis. NEUROLOGIC: No focal weakness or paresthesias are detected. SKIN: There are no ulcers or rashes noted. PSYCHIATRIC: The patient has a normal affect. Very  large extensive varicosities beginning in his medial thigh extending into his calf posteriorly and anteriorly and laterally. The area of bleeding calf with telangiectasia overlying this   DATA:  Formal venous duplex today was comparable to what I had seen on the initial visit with screening SonoSite ultrasound. Patient has reflux throughout his great saphenous vein with diameters ranging from 6-8 mm. He has reflux into these large varicosities throughout his calf. Deep venous reflux is only in the common femoral vein  MEDICAL ISSUES: Severe venous hypertension with 2 different bleeding episodes related to varicosities. Have recommended laser ablation of his right great saphenous vein and stab phlebectomy of his multiple tributary varicosities. Would also inject the area of telangiectasia which have bled. Explained this as an outpatient procedure under local anesthesia. Explain the very slight risk of DVT associated with procedure. He wished to proceed as soon as possible    Early, Sales promotion account executiveTodd [facility] [place] [facility] [phone]

## 2015-11-30 DIAGNOSIS — F1120 Opioid dependence, uncomplicated: Secondary | ICD-10-CM | POA: Diagnosis not present

## 2015-12-01 ENCOUNTER — Other Ambulatory Visit: Payer: Self-pay | Admitting: *Deleted

## 2015-12-01 DIAGNOSIS — F1120 Opioid dependence, uncomplicated: Secondary | ICD-10-CM | POA: Diagnosis not present

## 2015-12-01 DIAGNOSIS — I83891 Varicose veins of right lower extremities with other complications: Secondary | ICD-10-CM

## 2015-12-06 DIAGNOSIS — I8390 Asymptomatic varicose veins of unspecified lower extremity: Secondary | ICD-10-CM | POA: Diagnosis not present

## 2015-12-06 DIAGNOSIS — E785 Hyperlipidemia, unspecified: Secondary | ICD-10-CM | POA: Diagnosis not present

## 2015-12-06 DIAGNOSIS — N200 Calculus of kidney: Secondary | ICD-10-CM | POA: Diagnosis not present

## 2015-12-06 DIAGNOSIS — F1120 Opioid dependence, uncomplicated: Secondary | ICD-10-CM | POA: Diagnosis not present

## 2015-12-06 DIAGNOSIS — D559 Anemia due to enzyme disorder, unspecified: Secondary | ICD-10-CM | POA: Diagnosis not present

## 2015-12-06 DIAGNOSIS — Z0000 Encounter for general adult medical examination without abnormal findings: Secondary | ICD-10-CM | POA: Diagnosis not present

## 2015-12-07 DIAGNOSIS — F1120 Opioid dependence, uncomplicated: Secondary | ICD-10-CM | POA: Diagnosis not present

## 2015-12-08 DIAGNOSIS — F1120 Opioid dependence, uncomplicated: Secondary | ICD-10-CM | POA: Diagnosis not present

## 2015-12-09 MED FILL — IBUPROFEN 800 MG TABLET: 800 | 30 days supply | Qty: 90 | Fill #0

## 2015-12-13 DIAGNOSIS — F1120 Opioid dependence, uncomplicated: Secondary | ICD-10-CM | POA: Diagnosis not present

## 2015-12-14 DIAGNOSIS — F1120 Opioid dependence, uncomplicated: Secondary | ICD-10-CM | POA: Diagnosis not present

## 2015-12-15 DIAGNOSIS — F1120 Opioid dependence, uncomplicated: Secondary | ICD-10-CM | POA: Diagnosis not present

## 2015-12-20 DIAGNOSIS — F1120 Opioid dependence, uncomplicated: Secondary | ICD-10-CM | POA: Diagnosis not present

## 2015-12-21 DIAGNOSIS — F1120 Opioid dependence, uncomplicated: Secondary | ICD-10-CM | POA: Diagnosis not present

## 2015-12-22 ENCOUNTER — Encounter: Payer: Self-pay | Admitting: Vascular Surgery

## 2015-12-22 DIAGNOSIS — F1120 Opioid dependence, uncomplicated: Secondary | ICD-10-CM | POA: Diagnosis not present

## 2015-12-27 DIAGNOSIS — F1120 Opioid dependence, uncomplicated: Secondary | ICD-10-CM | POA: Diagnosis not present

## 2015-12-28 DIAGNOSIS — F1120 Opioid dependence, uncomplicated: Secondary | ICD-10-CM | POA: Diagnosis not present

## 2015-12-29 ENCOUNTER — Encounter: Payer: Self-pay | Admitting: Vascular Surgery

## 2015-12-29 ENCOUNTER — Ambulatory Visit (INDEPENDENT_AMBULATORY_CARE_PROVIDER_SITE_OTHER): Payer: 59 | Admitting: Vascular Surgery

## 2015-12-29 VITALS — BP 138/86 | HR 84 | Temp 97.8°F | Resp 16 | Ht 70.5 in | Wt 216.0 lb

## 2015-12-29 DIAGNOSIS — I83891 Varicose veins of right lower extremities with other complications: Secondary | ICD-10-CM | POA: Diagnosis not present

## 2015-12-29 DIAGNOSIS — F1120 Opioid dependence, uncomplicated: Secondary | ICD-10-CM | POA: Diagnosis not present

## 2015-12-29 DIAGNOSIS — I83899 Varicose veins of unspecified lower extremities with other complications: Secondary | ICD-10-CM | POA: Insufficient documentation

## 2015-12-29 DIAGNOSIS — I868 Varicose veins of other specified sites: Secondary | ICD-10-CM

## 2015-12-29 HISTORY — PX: ENDOVENOUS ABLATION SAPHENOUS VEIN W/ LASER: SUR449

## 2015-12-29 NOTE — Progress Notes (Signed)
Laser Ablation Procedure    Date: 12/29/2015   Pacer Wehr Moss DOB:16-Nov-1965  Consent signed: Yes    Surgeon:  Dr. Tawanna Cooler Caide Campi  Procedure: Laser Ablation: right Greater Saphenous Vein  BP 138/86 mmHg  Pulse 84  Temp(Src) 97.8 F (36.6 C) (Oral)  Resp 16  Ht 5' 10.5" (1.791 m)  Wt 216 lb (97.977 kg)  BMI 30.54 kg/m2  SpO2 96%  Tumescent Anesthesia: 800 cc 0.9% NaCl with 50 cc Lidocaine HCL with 1% Epi and 15 cc 8.4% NaHCO3  Local Anesthesia: 4 cc Lidocaine HCL and NaHCO3 (ratio 2:1)  15 watts continuous mode        Total energy: 1853 Joules   Total time: 2:03  Sclerotherapy: 0.3 %Sotradecol. Patient received a total of 1 cc  Stab Phlebectomy: >20 inciions Sites: Thigh and Calf  Right leg   Patient tolerated procedure well    Description of Procedure:  After marking the course of the secondary varicosities, the patient was placed on the operating table in the supine position, and the right leg was prepped and draped in sterile fashion.   Local anesthetic was administered and under ultrasound guidance the saphenous vein was accessed with a micro needle and guide wire; then the mirco puncture sheath was placed.  A guide wire was inserted saphenofemoral junction , followed by a 5 french sheath.  The position of the sheath and then the laser fiber below the junction was confirmed using the ultrasound.  Tumescent anesthesia was administered along the course of the saphenous vein using ultrasound guidance. The patient was placed in Trendelenburg position and protective laser glasses were placed on patient and staff, and the laser was fired at 15 watts continuous mode advancing 1-12mm/second for a total of 1853 joules.   For stab phlebectomies, local anesthetic was administered at the previously marked varicosities, and tumescent anesthesia was administered around the vessels.  Greater than 20 stab wounds were made using the tip of an 11 blade. And using the vein hook, the  phlebectomies were performed using a hemostat to avulse the varicosities.  Adequate hemostasis was achieved.   Sclerotherapy was performed to 1 varicosities using 1  cc .3% Sotradecol foam via a 30 gauge needle.  Steri strips were applied to the stab wounds and ABD pads and thigh high compression stockings were applied.  Ace wrap bandages were applied over the phlebectomy sites and at the top of the saphenofemoral junction. Blood loss was less than 15 cc.  The patient ambulated out of the operating room having tolerated the procedure well.  Uneventful ablation from both thighs to just below saphenofemoral junction. Phlebectomy of multiple large varicosities throughout the medial thigh and medial calf circumferentially around the pretibial area

## 2015-12-30 ENCOUNTER — Telehealth: Payer: Self-pay | Admitting: *Deleted

## 2015-12-30 ENCOUNTER — Encounter: Payer: Self-pay | Admitting: Vascular Surgery

## 2015-12-30 NOTE — Telephone Encounter (Signed)
    12/30/2015  Time: 10:00 AM   Patient Name: Joseph Ayala  Patient of: T.F. Early  Procedure: Laser ablation right greater saphenous vein, stab phlebectomy >20 incisions right leg, sclerotherapy right calf 12-29-2015   Reached patient at home and checked  His status  Yes    Comments/Actions Taken: Mr. Perfect states he has "right groin soreness" and no bleeding/oozing from incision sites right leg.  Reviewed post procedural instructions with [name] and reminded him of post LA duplex and VV follow up appointment with Dr. Arbie CookeyEarly on 01-05-2016.       @SIGNATURE @

## 2016-01-03 ENCOUNTER — Encounter: Payer: Self-pay | Admitting: Vascular Surgery

## 2016-01-05 ENCOUNTER — Ambulatory Visit (HOSPITAL_COMMUNITY)
Admission: RE | Admit: 2016-01-05 | Discharge: 2016-01-05 | Disposition: A | Discharge status: Home / Self Care | Payer: 59 | Source: Ambulatory Visit | Attending: Vascular Surgery | Admitting: Vascular Surgery

## 2016-01-05 ENCOUNTER — Encounter: Payer: Self-pay | Admitting: Vascular Surgery

## 2016-01-05 ENCOUNTER — Ambulatory Visit (INDEPENDENT_AMBULATORY_CARE_PROVIDER_SITE_OTHER): Payer: Self-pay | Admitting: Vascular Surgery

## 2016-01-05 VITALS — BP 146/100 | HR 89 | Temp 98.3°F | Resp 16 | Ht 70.5 in | Wt 212.0 lb

## 2016-01-05 DIAGNOSIS — I83891 Varicose veins of right lower extremities with other complications: Secondary | ICD-10-CM | POA: Diagnosis not present

## 2016-01-05 DIAGNOSIS — F1120 Opioid dependence, uncomplicated: Secondary | ICD-10-CM | POA: Diagnosis not present

## 2016-01-05 DIAGNOSIS — Z9889 Other specified postprocedural states: Secondary | ICD-10-CM | POA: Diagnosis not present

## 2016-01-05 NOTE — Progress Notes (Signed)
Patient name: Joseph Ayala MRN: [id number] DOB: May 07, 1966 Sex: male  REASON FOR VISIT: One-week follow-up laser ablation and stab phlebectomy  HPI: Joseph Ayala is a 50 y.o. male her today for one-week follow-up. Has done extremely well and this has been diligent with his compression garment. He was able to return to very active 12 hour work days on Monday without difficulty.  Current Outpatient Prescriptions  Medication Sig Dispense Refill  . oxyCODONE-acetaminophen (ROXICET) 5-325 MG per tablet Take 1-2 tablets by mouth every 4 hours as needed for pain (Patient not taking: Reported on 08/25/2015) 20 tablet 0  . testosterone cypionate (DEPO-TESTOSTERONE) 200 MG/ML injection Inject 200 mg into the muscle every 14 (fourteen) days.     No current facility-administered medications for this visit.      PHYSICAL EXAM: Filed Vitals:   01/05/16 0831  BP: 146/100  Pulse: 89  Temp: 98.3 F (36.8 C)  Resp: 16  Height: 5' 10.5" (1.791 m)  Weight: 212 lb (96.163 kg)  SpO2: 96%    GENERAL: The patient is a well-nourished male, in no acute distress. The vital signs are documented above. He does have a significant amount of bruising in his medial thigh. Stab phlebectomy sites are all healing nicely and the very large varices throughout his right leg.  Duplex today reveals no evidence of DVT. Saphenous vein is closed from the medial knee to saphenofemoral junction  MEDICAL ISSUES: Stable follow-up 1 week out from surgery. We will continue his compression garment for one additional week and then as needed. Will see [place] again on as-needed basis  Larina Earthly, MD [facility] Tel [phone] Pager [phone]

## 2016-01-11 DIAGNOSIS — F1120 Opioid dependence, uncomplicated: Secondary | ICD-10-CM | POA: Diagnosis not present

## 2016-01-12 DIAGNOSIS — F1120 Opioid dependence, uncomplicated: Secondary | ICD-10-CM | POA: Diagnosis not present

## 2016-01-12 MED FILL — DEPO-TESTOSTERONE 200 MG/ML: 200 | 84 days supply | Qty: 12 | Fill #0

## 2016-01-17 DIAGNOSIS — F1120 Opioid dependence, uncomplicated: Secondary | ICD-10-CM | POA: Diagnosis not present

## 2016-01-18 DIAGNOSIS — F1120 Opioid dependence, uncomplicated: Secondary | ICD-10-CM | POA: Diagnosis not present

## 2016-01-19 DIAGNOSIS — F1120 Opioid dependence, uncomplicated: Secondary | ICD-10-CM | POA: Diagnosis not present

## 2016-01-24 DIAGNOSIS — F1120 Opioid dependence, uncomplicated: Secondary | ICD-10-CM | POA: Diagnosis not present

## 2016-01-25 DIAGNOSIS — F1120 Opioid dependence, uncomplicated: Secondary | ICD-10-CM | POA: Diagnosis not present

## 2016-01-26 DIAGNOSIS — F1120 Opioid dependence, uncomplicated: Secondary | ICD-10-CM | POA: Diagnosis not present

## 2016-01-27 MED FILL — IBUPROFEN 800 MG TABLET: 800 | 30 days supply | Qty: 90 | Fill #0

## 2016-01-31 DIAGNOSIS — F1120 Opioid dependence, uncomplicated: Secondary | ICD-10-CM | POA: Diagnosis not present

## 2016-02-01 DIAGNOSIS — F1120 Opioid dependence, uncomplicated: Secondary | ICD-10-CM | POA: Diagnosis not present

## 2016-02-02 DIAGNOSIS — F1120 Opioid dependence, uncomplicated: Secondary | ICD-10-CM | POA: Diagnosis not present

## 2016-02-07 DIAGNOSIS — F1120 Opioid dependence, uncomplicated: Secondary | ICD-10-CM | POA: Diagnosis not present

## 2016-02-08 DIAGNOSIS — F1120 Opioid dependence, uncomplicated: Secondary | ICD-10-CM | POA: Diagnosis not present

## 2016-02-09 DIAGNOSIS — F1120 Opioid dependence, uncomplicated: Secondary | ICD-10-CM | POA: Diagnosis not present

## 2016-02-15 DIAGNOSIS — F1120 Opioid dependence, uncomplicated: Secondary | ICD-10-CM | POA: Diagnosis not present

## 2016-02-16 DIAGNOSIS — F1120 Opioid dependence, uncomplicated: Secondary | ICD-10-CM | POA: Diagnosis not present

## 2016-02-22 DIAGNOSIS — F1120 Opioid dependence, uncomplicated: Secondary | ICD-10-CM | POA: Diagnosis not present

## 2016-02-23 DIAGNOSIS — F1120 Opioid dependence, uncomplicated: Secondary | ICD-10-CM | POA: Diagnosis not present

## 2016-02-29 DIAGNOSIS — F1120 Opioid dependence, uncomplicated: Secondary | ICD-10-CM | POA: Diagnosis not present

## 2016-03-01 DIAGNOSIS — F1120 Opioid dependence, uncomplicated: Secondary | ICD-10-CM | POA: Diagnosis not present

## 2016-03-07 DIAGNOSIS — F1120 Opioid dependence, uncomplicated: Secondary | ICD-10-CM | POA: Diagnosis not present

## 2016-03-08 DIAGNOSIS — F1120 Opioid dependence, uncomplicated: Secondary | ICD-10-CM | POA: Diagnosis not present

## 2016-03-14 DIAGNOSIS — F1120 Opioid dependence, uncomplicated: Secondary | ICD-10-CM | POA: Diagnosis not present

## 2016-03-15 DIAGNOSIS — F1120 Opioid dependence, uncomplicated: Secondary | ICD-10-CM | POA: Diagnosis not present

## 2016-03-20 MED FILL — IBUPROFEN 800 MG TABLET: 800 | 30 days supply | Qty: 90 | Fill #1

## 2016-03-21 DIAGNOSIS — F1120 Opioid dependence, uncomplicated: Secondary | ICD-10-CM | POA: Diagnosis not present

## 2016-03-22 DIAGNOSIS — F1120 Opioid dependence, uncomplicated: Secondary | ICD-10-CM | POA: Diagnosis not present

## 2016-03-28 DIAGNOSIS — F1120 Opioid dependence, uncomplicated: Secondary | ICD-10-CM | POA: Diagnosis not present

## 2016-03-29 DIAGNOSIS — F1120 Opioid dependence, uncomplicated: Secondary | ICD-10-CM | POA: Diagnosis not present

## 2016-04-04 DIAGNOSIS — F1120 Opioid dependence, uncomplicated: Secondary | ICD-10-CM | POA: Diagnosis not present

## 2016-04-05 DIAGNOSIS — F1120 Opioid dependence, uncomplicated: Secondary | ICD-10-CM | POA: Diagnosis not present

## 2016-04-11 DIAGNOSIS — F1120 Opioid dependence, uncomplicated: Secondary | ICD-10-CM | POA: Diagnosis not present

## 2016-04-12 DIAGNOSIS — F1120 Opioid dependence, uncomplicated: Secondary | ICD-10-CM | POA: Diagnosis not present

## 2016-04-13 MED FILL — DEPO-TESTOSTERONE 200 MG/ML: 200 | 28 days supply | Qty: 4 | Fill #1

## 2016-04-18 DIAGNOSIS — F1120 Opioid dependence, uncomplicated: Secondary | ICD-10-CM | POA: Diagnosis not present

## 2016-04-19 DIAGNOSIS — F1120 Opioid dependence, uncomplicated: Secondary | ICD-10-CM | POA: Diagnosis not present

## 2016-04-25 DIAGNOSIS — F1120 Opioid dependence, uncomplicated: Secondary | ICD-10-CM | POA: Diagnosis not present

## 2016-04-26 DIAGNOSIS — F1120 Opioid dependence, uncomplicated: Secondary | ICD-10-CM | POA: Diagnosis not present

## 2016-05-02 DIAGNOSIS — F1120 Opioid dependence, uncomplicated: Secondary | ICD-10-CM | POA: Diagnosis not present

## 2016-05-19 ENCOUNTER — Encounter (HOSPITAL_BASED_OUTPATIENT_CLINIC_OR_DEPARTMENT_OTHER): Payer: Self-pay | Admitting: *Deleted

## 2016-05-19 ENCOUNTER — Emergency Department (HOSPITAL_BASED_OUTPATIENT_CLINIC_OR_DEPARTMENT_OTHER)
Admission: EM | Admit: 2016-05-19 | Discharge: 2016-05-19 | Disposition: A | Discharge status: Home / Self Care | Payer: 59 | Attending: Emergency Medicine | Admitting: Emergency Medicine

## 2016-05-19 DIAGNOSIS — F17290 Nicotine dependence, other tobacco product, uncomplicated: Secondary | ICD-10-CM | POA: Diagnosis not present

## 2016-05-19 DIAGNOSIS — L03317 Cellulitis of buttock: Secondary | ICD-10-CM | POA: Insufficient documentation

## 2016-05-19 DIAGNOSIS — R222 Localized swelling, mass and lump, trunk: Secondary | ICD-10-CM | POA: Diagnosis present

## 2016-05-19 MED ORDER — DOXYCYCLINE HYCLATE 100 MG PO CAPS: 100.0000 mg | ORAL_CAPSULE | Freq: Two times a day (BID) | ORAL | 0 refills | Status: DC

## 2016-05-19 MED ORDER — IBUPROFEN 800 MG PO TABS: 800.0000 mg | ORAL_TABLET | Freq: Three times a day (TID) | ORAL | 0 refills | Status: DC

## 2016-05-19 MED ORDER — CEPHALEXIN 250 MG PO CAPS
500.0000 mg | ORAL_CAPSULE | Freq: Once | ORAL | Status: AC
  Administered 2016-05-19: 500 mg via ORAL
  Filled 2016-05-19: qty 2

## 2016-05-19 MED ORDER — CEPHALEXIN 500 MG PO CAPS: 500.0000 mg | ORAL_CAPSULE | Freq: Four times a day (QID) | ORAL | 0 refills | Status: DC

## 2016-05-19 MED ORDER — DOXYCYCLINE HYCLATE 100 MG PO TABS
100.0000 mg | ORAL_TABLET | Freq: Once | ORAL | Status: AC
  Administered 2016-05-19: 100 mg via ORAL
  Filled 2016-05-19: qty 1

## 2016-05-19 NOTE — ED Provider Notes (Signed)
[facility] Provider Note   CSN: [id number] Arrival date & time: 05/19/16  2138  By signing my name below, I, Joseph Ayala, attest that this documentation has been prepared under the direction and in the presence of Tearia Gibbs, MD. Electronically Signed: Angelene GiovanniEmmanuella Ayala, ED Scribe. 05/19/16. 11:18 PM.   History   Chief Complaint Chief Complaint  Patient presents with  . Abscess    HPI Comments: Joseph Ayala is a 50 y.o. male with a hx of varicose veins who presents to the Emergency Department complaining of gradually worsening moderately painful area of swelling and redness to his left buttock onset 3 days ago. He explains that he noticed the area after giving himself a testosterone injection to his left buttock. No alleviating factors noted. Pt has not tried any medications PTA. He has an allergy to Vicodin with nausea and vomiting. He reports that his last tetanus vaccine was 6-7 years ago. He denies any fever, chills, nausea, vomiting, or any other symptoms.   PCP: Dr. Chilton SiGreen  The history is provided by the patient. No language interpreter was used.  Abscess  Location:  Pelvis Pelvic abscess location:  L buttock Abscess quality: painful and redness   Red streaking: no   Duration:  3 days Progression:  Worsening Pain details:    Severity:  Moderate   Duration:  3 days   Timing:  Constant   Progression:  Worsening Chronicity:  New Injected drug use: testosterone    Context comment:  Injecting testosterone Relieved by:  None tried Worsened by:  Nothing Ineffective treatments:  None tried Associated symptoms: no fever, no nausea and no vomiting   Risk factors: no family hx of MRSA     Past Medical History:  Diagnosis Date  . Back injury   . Kidney stone   . Kidney stones   . Varicose veins     Patient Active Problem List   Diagnosis Date Noted  . Varicose veins of leg with edema 12/29/2015  . LIVER FUNCTION TESTS, ABNORMAL 06/10/2007     Past Surgical History:  Procedure Laterality Date  . ENDOVENOUS ABLATION SAPHENOUS VEIN W/ LASER Right 12-29-2015   endovenous laser ablation right greater saphenous vein, stab phlebectomy > 20 incisions right leg, sclerotherapy right leg by Gretta Beganodd Early MD    . RHINOPLASTY         Home Medications    Prior to Admission medications   Medication Sig Start Date End Date Taking? Authorizing Provider  testosterone cypionate (DEPO-TESTOSTERONE) 200 MG/ML injection Inject 200 mg into the muscle every 14 (fourteen) days.    Historical Provider, MD    Family History Family History  Problem Relation Age of Onset  . Varicose Veins Brother     Social History Social History  Substance Use Topics  . Smoking status: Never Smoker  . Smokeless tobacco: Current User    Types: Snuff  . Alcohol use No     Allergies   Strawberry (diagnostic) and Vicodin [hydrocodone-acetaminophen]   Review of Systems Review of Systems  Constitutional: Negative for fever.  Gastrointestinal: Negative for diarrhea, nausea and vomiting.  Skin: Positive for color change. Wound: cellulitis.  All other systems reviewed and are negative.    Physical Exam Updated Vital Signs BP (!) 141/106 (BP Location: Left Arm)   Pulse 103   Temp 98.2 F (36.8 C) (Oral)   Resp 18   Ht 5\' 11"  (1.803 m)   Wt 215 lb (97.5 kg)   SpO2 97%  BMI 29.99 kg/m   Physical Exam  Constitutional: He is oriented to person, place, and time. He appears well-developed and well-nourished. No distress.  HENT:  Head: Normocephalic and atraumatic.  Mouth/Throat: Oropharynx is clear and moist.  Eyes: Conjunctivae and EOM are normal. Pupils are equal, round, and reactive to light.  Neck: Normal range of motion. Neck supple. No tracheal deviation present.  Cardiovascular: Normal rate, regular rhythm, normal heart sounds and intact distal pulses.   Pulmonary/Chest: Effort normal and breath sounds normal. No respiratory distress. He  has no wheezes. He has no rales.  Lungs clear  Abdominal: Soft. Bowel sounds are normal. There is no rebound and no guarding.  Musculoskeletal: Normal range of motion.  Neurological: He is alert and oriented to person, place, and time.  Skin: Skin is warm and dry. Capillary refill takes less than 2 seconds.  1.5 by 1.3 cm oval lipoma with overlying cellulitis to the left buttock toward his left hip; no streaking; no abscess cavity  Psychiatric: He has a normal mood and affect. His behavior is normal.  Nursing note and vitals reviewed.    ED Treatments / Results  DIAGNOSTIC STUDIES: Oxygen Saturation is 97% on RA, normal by my interpretation.    COORDINATION OF CARE: 11:17 PM- Pt advised of plan for treatment and pt agrees. Spoke extensively about the difference between abscess and lipoma. Advised pt to ice area and rest it. He will receive Keflex and Doxycycline. Will provide resources for general surgery follow up.     Procedures Procedures (including critical care time)  Medications Ordered in ED Medications  cephALEXin (KEFLEX) capsule 500 mg (500 mg Oral Given 05/19/16 2325)  doxycycline (VIBRA-TABS) tablet 100 mg (100 mg Oral Given 05/19/16 2325)    Initial Impression / Assessment and Plan / ED Course  Joseph Nawaz, MD has reviewed the triage vital signs and the nursing notes.  Final Clinical Impressions(s) / ED Diagnoses  Cellulitis of the buttocks. .All questions answered to patient's satisfaction. Based on history and exam patient has been appropriately medically screened and emergency conditions excluded. Patient is stable for discharge at this time. Follow up with your PMD for recheck in 2 days and strict return precautions given.   I personally performed the services described in this documentation, which was scribed in my presence. The recorded information has been reviewed and is accurate.       Cy BlamerApril Durga Saldarriaga, MD 05/19/16 [id number]

## 2016-05-19 NOTE — ED Triage Notes (Signed)
Pt reports an abscess or cellulitis where he gave himself Testerone shots on Wednesday.

## 2016-05-22 DIAGNOSIS — L03317 Cellulitis of buttock: Secondary | ICD-10-CM | POA: Diagnosis not present

## 2016-05-30 MED FILL — IBUPROFEN 800 MG TABLET: 800 | 30 days supply | Qty: 90 | Fill #2

## 2016-05-30 MED FILL — DEPO-TESTOSTERONE 200 MG/ML: 200 | 28 days supply | Qty: 4 | Fill #2

## 2016-06-26 MED FILL — DEPO-TESTOSTERONE 200 MG/ML: 200 | 28 days supply | Qty: 4 | Fill #3

## 2016-07-18 DIAGNOSIS — I10 Essential (primary) hypertension: Secondary | ICD-10-CM | POA: Diagnosis not present

## 2016-07-18 MED FILL — IBUPROFEN 800 MG TABLET: 800 | 30 days supply | Qty: 90 | Fill #3

## 2016-08-06 MED FILL — DEPO-TESTOSTERONE 200 MG/ML: 200 | 83 days supply | Qty: 12 | Fill #0

## 2016-09-04 DIAGNOSIS — I10 Essential (primary) hypertension: Secondary | ICD-10-CM | POA: Diagnosis not present

## 2016-09-05 DIAGNOSIS — H10413 Chronic giant papillary conjunctivitis, bilateral: Secondary | ICD-10-CM | POA: Diagnosis not present

## 2016-09-05 DIAGNOSIS — H5712 Ocular pain, left eye: Secondary | ICD-10-CM | POA: Diagnosis not present

## 2016-09-05 MED FILL — HYDROCHLOROTHIAZIDE 25 MG T: 25 | 90 days supply | Qty: 90 | Fill #0

## 2016-09-17 MED FILL — IBUPROFEN 800 MG TABLET: 800 | 30 days supply | Qty: 90 | Fill #4

## 2016-10-26 MED FILL — IBUPROFEN 800 MG TAB: 800 | 30 days supply | Qty: 90 | Fill #5

## 2016-11-06 MED FILL — DEPO-TESTOSTERONE 200 MG/ML: 200 | 83 days supply | Qty: 12 | Fill #1

## 2016-12-07 DIAGNOSIS — F1120 Opioid dependence, uncomplicated: Secondary | ICD-10-CM | POA: Diagnosis not present

## 2016-12-09 DIAGNOSIS — F1120 Opioid dependence, uncomplicated: Secondary | ICD-10-CM | POA: Diagnosis not present

## 2016-12-10 DIAGNOSIS — F1120 Opioid dependence, uncomplicated: Secondary | ICD-10-CM | POA: Diagnosis not present

## 2016-12-11 DIAGNOSIS — F1120 Opioid dependence, uncomplicated: Secondary | ICD-10-CM | POA: Diagnosis not present

## 2016-12-12 DIAGNOSIS — F1120 Opioid dependence, uncomplicated: Secondary | ICD-10-CM | POA: Diagnosis not present

## 2016-12-13 DIAGNOSIS — F1120 Opioid dependence, uncomplicated: Secondary | ICD-10-CM | POA: Diagnosis not present

## 2016-12-14 DIAGNOSIS — F1120 Opioid dependence, uncomplicated: Secondary | ICD-10-CM | POA: Diagnosis not present

## 2016-12-15 DIAGNOSIS — F1120 Opioid dependence, uncomplicated: Secondary | ICD-10-CM | POA: Diagnosis not present

## 2016-12-16 DIAGNOSIS — F1120 Opioid dependence, uncomplicated: Secondary | ICD-10-CM | POA: Diagnosis not present

## 2016-12-17 DIAGNOSIS — F1120 Opioid dependence, uncomplicated: Secondary | ICD-10-CM | POA: Diagnosis not present

## 2016-12-18 DIAGNOSIS — F1120 Opioid dependence, uncomplicated: Secondary | ICD-10-CM | POA: Diagnosis not present

## 2016-12-19 DIAGNOSIS — F1120 Opioid dependence, uncomplicated: Secondary | ICD-10-CM | POA: Diagnosis not present

## 2016-12-20 DIAGNOSIS — F1120 Opioid dependence, uncomplicated: Secondary | ICD-10-CM | POA: Diagnosis not present

## 2016-12-21 DIAGNOSIS — F1120 Opioid dependence, uncomplicated: Secondary | ICD-10-CM | POA: Diagnosis not present

## 2016-12-22 DIAGNOSIS — F1120 Opioid dependence, uncomplicated: Secondary | ICD-10-CM | POA: Diagnosis not present

## 2016-12-24 DIAGNOSIS — F1120 Opioid dependence, uncomplicated: Secondary | ICD-10-CM | POA: Diagnosis not present

## 2016-12-25 DIAGNOSIS — F1120 Opioid dependence, uncomplicated: Secondary | ICD-10-CM | POA: Diagnosis not present

## 2016-12-25 MED FILL — IBUPROFEN 800 MG TAB: 800 | 30 days supply | Qty: 90 | Fill #6

## 2016-12-26 DIAGNOSIS — F1120 Opioid dependence, uncomplicated: Secondary | ICD-10-CM | POA: Diagnosis not present

## 2016-12-27 DIAGNOSIS — F1120 Opioid dependence, uncomplicated: Secondary | ICD-10-CM | POA: Diagnosis not present

## 2016-12-28 DIAGNOSIS — F1120 Opioid dependence, uncomplicated: Secondary | ICD-10-CM | POA: Diagnosis not present

## 2016-12-29 DIAGNOSIS — F1120 Opioid dependence, uncomplicated: Secondary | ICD-10-CM | POA: Diagnosis not present

## 2016-12-30 DIAGNOSIS — F1120 Opioid dependence, uncomplicated: Secondary | ICD-10-CM | POA: Diagnosis not present

## 2016-12-31 DIAGNOSIS — F1120 Opioid dependence, uncomplicated: Secondary | ICD-10-CM | POA: Diagnosis not present

## 2017-01-01 DIAGNOSIS — F1120 Opioid dependence, uncomplicated: Secondary | ICD-10-CM | POA: Diagnosis not present

## 2017-01-02 DIAGNOSIS — F1120 Opioid dependence, uncomplicated: Secondary | ICD-10-CM | POA: Diagnosis not present

## 2017-01-03 DIAGNOSIS — F1120 Opioid dependence, uncomplicated: Secondary | ICD-10-CM | POA: Diagnosis not present

## 2017-01-04 DIAGNOSIS — F1120 Opioid dependence, uncomplicated: Secondary | ICD-10-CM | POA: Diagnosis not present

## 2017-01-05 DIAGNOSIS — F1120 Opioid dependence, uncomplicated: Secondary | ICD-10-CM | POA: Diagnosis not present

## 2017-01-07 DIAGNOSIS — F1120 Opioid dependence, uncomplicated: Secondary | ICD-10-CM | POA: Diagnosis not present

## 2017-01-08 DIAGNOSIS — F1120 Opioid dependence, uncomplicated: Secondary | ICD-10-CM | POA: Diagnosis not present

## 2017-01-09 DIAGNOSIS — F1120 Opioid dependence, uncomplicated: Secondary | ICD-10-CM | POA: Diagnosis not present

## 2017-01-10 DIAGNOSIS — F1120 Opioid dependence, uncomplicated: Secondary | ICD-10-CM | POA: Diagnosis not present

## 2017-01-11 DIAGNOSIS — F1120 Opioid dependence, uncomplicated: Secondary | ICD-10-CM | POA: Diagnosis not present

## 2017-01-12 DIAGNOSIS — F1120 Opioid dependence, uncomplicated: Secondary | ICD-10-CM | POA: Diagnosis not present

## 2017-01-13 DIAGNOSIS — F1120 Opioid dependence, uncomplicated: Secondary | ICD-10-CM | POA: Diagnosis not present

## 2017-01-14 DIAGNOSIS — F1120 Opioid dependence, uncomplicated: Secondary | ICD-10-CM | POA: Diagnosis not present

## 2017-01-15 DIAGNOSIS — F1120 Opioid dependence, uncomplicated: Secondary | ICD-10-CM | POA: Diagnosis not present

## 2017-01-16 DIAGNOSIS — F1120 Opioid dependence, uncomplicated: Secondary | ICD-10-CM | POA: Diagnosis not present

## 2017-01-17 DIAGNOSIS — F1120 Opioid dependence, uncomplicated: Secondary | ICD-10-CM | POA: Diagnosis not present

## 2017-01-18 DIAGNOSIS — F1120 Opioid dependence, uncomplicated: Secondary | ICD-10-CM | POA: Diagnosis not present

## 2017-01-19 DIAGNOSIS — F1120 Opioid dependence, uncomplicated: Secondary | ICD-10-CM | POA: Diagnosis not present

## 2017-01-20 DIAGNOSIS — F1120 Opioid dependence, uncomplicated: Secondary | ICD-10-CM | POA: Diagnosis not present

## 2017-01-21 DIAGNOSIS — F1120 Opioid dependence, uncomplicated: Secondary | ICD-10-CM | POA: Diagnosis not present

## 2017-01-22 DIAGNOSIS — F1120 Opioid dependence, uncomplicated: Secondary | ICD-10-CM | POA: Diagnosis not present

## 2017-01-23 DIAGNOSIS — F1120 Opioid dependence, uncomplicated: Secondary | ICD-10-CM | POA: Diagnosis not present

## 2017-01-24 DIAGNOSIS — F1120 Opioid dependence, uncomplicated: Secondary | ICD-10-CM | POA: Diagnosis not present

## 2017-01-25 DIAGNOSIS — F1120 Opioid dependence, uncomplicated: Secondary | ICD-10-CM | POA: Diagnosis not present

## 2017-01-26 DIAGNOSIS — F1120 Opioid dependence, uncomplicated: Secondary | ICD-10-CM | POA: Diagnosis not present

## 2017-01-27 DIAGNOSIS — F1120 Opioid dependence, uncomplicated: Secondary | ICD-10-CM | POA: Diagnosis not present

## 2017-01-28 DIAGNOSIS — F1120 Opioid dependence, uncomplicated: Secondary | ICD-10-CM | POA: Diagnosis not present

## 2017-01-28 MED FILL — TESTOSTERONE CYP 200 MG/ML: 200 | 83 days supply | Qty: 12 | Fill #2

## 2017-01-29 DIAGNOSIS — F1120 Opioid dependence, uncomplicated: Secondary | ICD-10-CM | POA: Diagnosis not present

## 2017-01-30 DIAGNOSIS — F1120 Opioid dependence, uncomplicated: Secondary | ICD-10-CM | POA: Diagnosis not present

## 2017-01-31 DIAGNOSIS — F1120 Opioid dependence, uncomplicated: Secondary | ICD-10-CM | POA: Diagnosis not present

## 2017-02-01 DIAGNOSIS — F1120 Opioid dependence, uncomplicated: Secondary | ICD-10-CM | POA: Diagnosis not present

## 2017-02-02 DIAGNOSIS — F1120 Opioid dependence, uncomplicated: Secondary | ICD-10-CM | POA: Diagnosis not present

## 2017-02-09 DIAGNOSIS — F1120 Opioid dependence, uncomplicated: Secondary | ICD-10-CM | POA: Diagnosis not present

## 2017-02-10 DIAGNOSIS — F1120 Opioid dependence, uncomplicated: Secondary | ICD-10-CM | POA: Diagnosis not present

## 2017-02-11 DIAGNOSIS — F1120 Opioid dependence, uncomplicated: Secondary | ICD-10-CM | POA: Diagnosis not present

## 2017-02-12 DIAGNOSIS — F1120 Opioid dependence, uncomplicated: Secondary | ICD-10-CM | POA: Diagnosis not present

## 2017-02-13 DIAGNOSIS — F1120 Opioid dependence, uncomplicated: Secondary | ICD-10-CM | POA: Diagnosis not present

## 2017-02-14 DIAGNOSIS — F1120 Opioid dependence, uncomplicated: Secondary | ICD-10-CM | POA: Diagnosis not present

## 2017-02-15 DIAGNOSIS — F1120 Opioid dependence, uncomplicated: Secondary | ICD-10-CM | POA: Diagnosis not present

## 2017-02-16 DIAGNOSIS — F1120 Opioid dependence, uncomplicated: Secondary | ICD-10-CM | POA: Diagnosis not present

## 2017-02-17 DIAGNOSIS — F1120 Opioid dependence, uncomplicated: Secondary | ICD-10-CM | POA: Diagnosis not present

## 2017-02-18 DIAGNOSIS — F1120 Opioid dependence, uncomplicated: Secondary | ICD-10-CM | POA: Diagnosis not present

## 2017-02-19 DIAGNOSIS — F1120 Opioid dependence, uncomplicated: Secondary | ICD-10-CM | POA: Diagnosis not present

## 2017-02-20 DIAGNOSIS — F1120 Opioid dependence, uncomplicated: Secondary | ICD-10-CM | POA: Diagnosis not present

## 2017-02-21 DIAGNOSIS — F1120 Opioid dependence, uncomplicated: Secondary | ICD-10-CM | POA: Diagnosis not present

## 2017-02-22 DIAGNOSIS — F1120 Opioid dependence, uncomplicated: Secondary | ICD-10-CM | POA: Diagnosis not present

## 2017-02-23 DIAGNOSIS — F1120 Opioid dependence, uncomplicated: Secondary | ICD-10-CM | POA: Diagnosis not present

## 2017-02-24 DIAGNOSIS — F1120 Opioid dependence, uncomplicated: Secondary | ICD-10-CM | POA: Diagnosis not present

## 2017-02-25 DIAGNOSIS — F1120 Opioid dependence, uncomplicated: Secondary | ICD-10-CM | POA: Diagnosis not present

## 2017-02-26 DIAGNOSIS — F1120 Opioid dependence, uncomplicated: Secondary | ICD-10-CM | POA: Diagnosis not present

## 2017-02-27 DIAGNOSIS — F1120 Opioid dependence, uncomplicated: Secondary | ICD-10-CM | POA: Diagnosis not present

## 2017-02-28 DIAGNOSIS — F1120 Opioid dependence, uncomplicated: Secondary | ICD-10-CM | POA: Diagnosis not present

## 2017-03-01 DIAGNOSIS — F1120 Opioid dependence, uncomplicated: Secondary | ICD-10-CM | POA: Diagnosis not present

## 2017-03-02 DIAGNOSIS — F1120 Opioid dependence, uncomplicated: Secondary | ICD-10-CM | POA: Diagnosis not present

## 2017-03-03 DIAGNOSIS — F1120 Opioid dependence, uncomplicated: Secondary | ICD-10-CM | POA: Diagnosis not present

## 2017-03-04 DIAGNOSIS — F1120 Opioid dependence, uncomplicated: Secondary | ICD-10-CM | POA: Diagnosis not present

## 2017-03-05 DIAGNOSIS — F1120 Opioid dependence, uncomplicated: Secondary | ICD-10-CM | POA: Diagnosis not present

## 2017-03-06 DIAGNOSIS — F1120 Opioid dependence, uncomplicated: Secondary | ICD-10-CM | POA: Diagnosis not present

## 2017-03-07 DIAGNOSIS — F1120 Opioid dependence, uncomplicated: Secondary | ICD-10-CM | POA: Diagnosis not present

## 2017-03-08 DIAGNOSIS — F1120 Opioid dependence, uncomplicated: Secondary | ICD-10-CM | POA: Diagnosis not present

## 2017-03-09 DIAGNOSIS — F1120 Opioid dependence, uncomplicated: Secondary | ICD-10-CM | POA: Diagnosis not present

## 2017-03-10 DIAGNOSIS — F1120 Opioid dependence, uncomplicated: Secondary | ICD-10-CM | POA: Diagnosis not present

## 2017-03-12 DIAGNOSIS — F1120 Opioid dependence, uncomplicated: Secondary | ICD-10-CM | POA: Diagnosis not present

## 2017-03-13 DIAGNOSIS — F1120 Opioid dependence, uncomplicated: Secondary | ICD-10-CM | POA: Diagnosis not present

## 2017-03-14 DIAGNOSIS — F1120 Opioid dependence, uncomplicated: Secondary | ICD-10-CM | POA: Diagnosis not present

## 2017-03-15 DIAGNOSIS — F1120 Opioid dependence, uncomplicated: Secondary | ICD-10-CM | POA: Diagnosis not present

## 2017-03-16 DIAGNOSIS — F1120 Opioid dependence, uncomplicated: Secondary | ICD-10-CM | POA: Diagnosis not present

## 2017-03-17 DIAGNOSIS — F1120 Opioid dependence, uncomplicated: Secondary | ICD-10-CM | POA: Diagnosis not present

## 2017-03-18 DIAGNOSIS — F1120 Opioid dependence, uncomplicated: Secondary | ICD-10-CM | POA: Diagnosis not present

## 2017-03-19 DIAGNOSIS — F1120 Opioid dependence, uncomplicated: Secondary | ICD-10-CM | POA: Diagnosis not present

## 2017-03-20 DIAGNOSIS — F1120 Opioid dependence, uncomplicated: Secondary | ICD-10-CM | POA: Diagnosis not present

## 2017-03-21 DIAGNOSIS — F1120 Opioid dependence, uncomplicated: Secondary | ICD-10-CM | POA: Diagnosis not present

## 2017-03-25 DIAGNOSIS — F1120 Opioid dependence, uncomplicated: Secondary | ICD-10-CM | POA: Diagnosis not present

## 2017-03-26 DIAGNOSIS — F1120 Opioid dependence, uncomplicated: Secondary | ICD-10-CM | POA: Diagnosis not present

## 2017-03-27 DIAGNOSIS — F1120 Opioid dependence, uncomplicated: Secondary | ICD-10-CM | POA: Diagnosis not present

## 2017-03-28 DIAGNOSIS — F1120 Opioid dependence, uncomplicated: Secondary | ICD-10-CM | POA: Diagnosis not present

## 2017-03-29 DIAGNOSIS — F1120 Opioid dependence, uncomplicated: Secondary | ICD-10-CM | POA: Diagnosis not present

## 2017-03-30 DIAGNOSIS — F1120 Opioid dependence, uncomplicated: Secondary | ICD-10-CM | POA: Diagnosis not present

## 2017-03-31 DIAGNOSIS — F1120 Opioid dependence, uncomplicated: Secondary | ICD-10-CM | POA: Diagnosis not present

## 2017-04-01 DIAGNOSIS — F1120 Opioid dependence, uncomplicated: Secondary | ICD-10-CM | POA: Diagnosis not present

## 2017-04-02 DIAGNOSIS — F1120 Opioid dependence, uncomplicated: Secondary | ICD-10-CM | POA: Diagnosis not present

## 2017-04-03 DIAGNOSIS — F1120 Opioid dependence, uncomplicated: Secondary | ICD-10-CM | POA: Diagnosis not present

## 2017-04-04 DIAGNOSIS — F1120 Opioid dependence, uncomplicated: Secondary | ICD-10-CM | POA: Diagnosis not present

## 2017-04-05 DIAGNOSIS — F1120 Opioid dependence, uncomplicated: Secondary | ICD-10-CM | POA: Diagnosis not present

## 2017-04-06 DIAGNOSIS — F1120 Opioid dependence, uncomplicated: Secondary | ICD-10-CM | POA: Diagnosis not present

## 2017-04-07 DIAGNOSIS — F1120 Opioid dependence, uncomplicated: Secondary | ICD-10-CM | POA: Diagnosis not present

## 2017-04-08 DIAGNOSIS — F1120 Opioid dependence, uncomplicated: Secondary | ICD-10-CM | POA: Diagnosis not present

## 2017-04-09 DIAGNOSIS — F1120 Opioid dependence, uncomplicated: Secondary | ICD-10-CM | POA: Diagnosis not present

## 2017-04-10 DIAGNOSIS — F1120 Opioid dependence, uncomplicated: Secondary | ICD-10-CM | POA: Diagnosis not present

## 2017-04-11 DIAGNOSIS — F1120 Opioid dependence, uncomplicated: Secondary | ICD-10-CM | POA: Diagnosis not present

## 2017-04-12 DIAGNOSIS — F1120 Opioid dependence, uncomplicated: Secondary | ICD-10-CM | POA: Diagnosis not present

## 2017-04-13 DIAGNOSIS — F1120 Opioid dependence, uncomplicated: Secondary | ICD-10-CM | POA: Diagnosis not present

## 2017-04-15 DIAGNOSIS — F1120 Opioid dependence, uncomplicated: Secondary | ICD-10-CM | POA: Diagnosis not present

## 2017-04-16 DIAGNOSIS — F1120 Opioid dependence, uncomplicated: Secondary | ICD-10-CM | POA: Diagnosis not present

## 2017-04-17 DIAGNOSIS — F1120 Opioid dependence, uncomplicated: Secondary | ICD-10-CM | POA: Diagnosis not present

## 2017-04-18 DIAGNOSIS — F1120 Opioid dependence, uncomplicated: Secondary | ICD-10-CM | POA: Diagnosis not present

## 2017-04-19 DIAGNOSIS — F1120 Opioid dependence, uncomplicated: Secondary | ICD-10-CM | POA: Diagnosis not present

## 2017-04-20 DIAGNOSIS — F1120 Opioid dependence, uncomplicated: Secondary | ICD-10-CM | POA: Diagnosis not present

## 2017-04-21 DIAGNOSIS — F1120 Opioid dependence, uncomplicated: Secondary | ICD-10-CM | POA: Diagnosis not present

## 2017-04-22 DIAGNOSIS — F1120 Opioid dependence, uncomplicated: Secondary | ICD-10-CM | POA: Diagnosis not present

## 2017-04-23 DIAGNOSIS — F1120 Opioid dependence, uncomplicated: Secondary | ICD-10-CM | POA: Diagnosis not present

## 2017-04-24 DIAGNOSIS — F1120 Opioid dependence, uncomplicated: Secondary | ICD-10-CM | POA: Diagnosis not present

## 2017-04-25 DIAGNOSIS — F1120 Opioid dependence, uncomplicated: Secondary | ICD-10-CM | POA: Diagnosis not present

## 2017-04-26 DIAGNOSIS — F1120 Opioid dependence, uncomplicated: Secondary | ICD-10-CM | POA: Diagnosis not present

## 2017-04-27 DIAGNOSIS — F1120 Opioid dependence, uncomplicated: Secondary | ICD-10-CM | POA: Diagnosis not present

## 2017-04-28 DIAGNOSIS — F1120 Opioid dependence, uncomplicated: Secondary | ICD-10-CM | POA: Diagnosis not present

## 2017-04-29 DIAGNOSIS — F1120 Opioid dependence, uncomplicated: Secondary | ICD-10-CM | POA: Diagnosis not present

## 2017-04-30 DIAGNOSIS — F1120 Opioid dependence, uncomplicated: Secondary | ICD-10-CM | POA: Diagnosis not present

## 2017-04-30 DIAGNOSIS — I10 Essential (primary) hypertension: Secondary | ICD-10-CM | POA: Diagnosis not present

## 2017-04-30 MED FILL — HYDROCHLOROTHIAZIDE 25 MG T: 25 | 30 days supply | Qty: 30 | Fill #0

## 2017-05-01 DIAGNOSIS — F1120 Opioid dependence, uncomplicated: Secondary | ICD-10-CM | POA: Diagnosis not present

## 2017-05-02 DIAGNOSIS — F1120 Opioid dependence, uncomplicated: Secondary | ICD-10-CM | POA: Diagnosis not present

## 2017-05-03 DIAGNOSIS — F1120 Opioid dependence, uncomplicated: Secondary | ICD-10-CM | POA: Diagnosis not present

## 2017-05-05 DIAGNOSIS — F1120 Opioid dependence, uncomplicated: Secondary | ICD-10-CM | POA: Diagnosis not present

## 2017-05-06 DIAGNOSIS — F1120 Opioid dependence, uncomplicated: Secondary | ICD-10-CM | POA: Diagnosis not present

## 2017-05-07 DIAGNOSIS — F1120 Opioid dependence, uncomplicated: Secondary | ICD-10-CM | POA: Diagnosis not present

## 2017-05-08 DIAGNOSIS — F1120 Opioid dependence, uncomplicated: Secondary | ICD-10-CM | POA: Diagnosis not present

## 2017-05-09 DIAGNOSIS — F1120 Opioid dependence, uncomplicated: Secondary | ICD-10-CM | POA: Diagnosis not present

## 2017-05-10 DIAGNOSIS — F1120 Opioid dependence, uncomplicated: Secondary | ICD-10-CM | POA: Diagnosis not present

## 2017-05-11 DIAGNOSIS — F1120 Opioid dependence, uncomplicated: Secondary | ICD-10-CM | POA: Diagnosis not present

## 2017-05-12 DIAGNOSIS — F1120 Opioid dependence, uncomplicated: Secondary | ICD-10-CM | POA: Diagnosis not present

## 2017-05-13 DIAGNOSIS — F1120 Opioid dependence, uncomplicated: Secondary | ICD-10-CM | POA: Diagnosis not present

## 2017-05-14 DIAGNOSIS — F1120 Opioid dependence, uncomplicated: Secondary | ICD-10-CM | POA: Diagnosis not present

## 2017-05-15 DIAGNOSIS — F1120 Opioid dependence, uncomplicated: Secondary | ICD-10-CM | POA: Diagnosis not present

## 2017-05-16 DIAGNOSIS — L03012 Cellulitis of left finger: Secondary | ICD-10-CM | POA: Diagnosis not present

## 2017-05-16 DIAGNOSIS — I10 Essential (primary) hypertension: Secondary | ICD-10-CM | POA: Diagnosis not present

## 2017-05-16 DIAGNOSIS — F1120 Opioid dependence, uncomplicated: Secondary | ICD-10-CM | POA: Diagnosis not present

## 2017-05-16 DIAGNOSIS — I83813 Varicose veins of bilateral lower extremities with pain: Secondary | ICD-10-CM | POA: Diagnosis not present

## 2017-05-16 MED FILL — TESTOSTERONE CYP 200 MG/ML: 200 | 83 days supply | Qty: 12 | Fill #0

## 2017-05-16 MED FILL — CEPHALEXIN 500 MG CAPSULE: 500 | 7 days supply | Qty: 14 | Fill #0

## 2017-05-17 DIAGNOSIS — L03011 Cellulitis of right finger: Secondary | ICD-10-CM | POA: Diagnosis not present

## 2017-05-17 DIAGNOSIS — F1120 Opioid dependence, uncomplicated: Secondary | ICD-10-CM | POA: Diagnosis not present

## 2017-05-18 DIAGNOSIS — F1120 Opioid dependence, uncomplicated: Secondary | ICD-10-CM | POA: Diagnosis not present

## 2017-05-19 DIAGNOSIS — F1120 Opioid dependence, uncomplicated: Secondary | ICD-10-CM | POA: Diagnosis not present

## 2017-05-20 DIAGNOSIS — F1120 Opioid dependence, uncomplicated: Secondary | ICD-10-CM | POA: Diagnosis not present

## 2017-05-21 DIAGNOSIS — F1120 Opioid dependence, uncomplicated: Secondary | ICD-10-CM | POA: Diagnosis not present

## 2017-05-22 DIAGNOSIS — F1120 Opioid dependence, uncomplicated: Secondary | ICD-10-CM | POA: Diagnosis not present

## 2017-05-23 DIAGNOSIS — F1120 Opioid dependence, uncomplicated: Secondary | ICD-10-CM | POA: Diagnosis not present

## 2017-05-24 DIAGNOSIS — F1120 Opioid dependence, uncomplicated: Secondary | ICD-10-CM | POA: Diagnosis not present

## 2017-05-25 DIAGNOSIS — F1120 Opioid dependence, uncomplicated: Secondary | ICD-10-CM | POA: Diagnosis not present

## 2017-05-26 DIAGNOSIS — F1120 Opioid dependence, uncomplicated: Secondary | ICD-10-CM | POA: Diagnosis not present

## 2017-05-27 DIAGNOSIS — F1120 Opioid dependence, uncomplicated: Secondary | ICD-10-CM | POA: Diagnosis not present

## 2017-05-28 DIAGNOSIS — F1120 Opioid dependence, uncomplicated: Secondary | ICD-10-CM | POA: Diagnosis not present

## 2017-05-29 MED FILL — HYDROCHLOROTHIAZIDE 25 MG T: 25 | 90 days supply | Qty: 90 | Fill #1

## 2017-05-30 DIAGNOSIS — F1120 Opioid dependence, uncomplicated: Secondary | ICD-10-CM | POA: Diagnosis not present

## 2017-05-31 DIAGNOSIS — F1120 Opioid dependence, uncomplicated: Secondary | ICD-10-CM | POA: Diagnosis not present

## 2017-06-01 DIAGNOSIS — F1120 Opioid dependence, uncomplicated: Secondary | ICD-10-CM | POA: Diagnosis not present

## 2017-06-02 DIAGNOSIS — F1120 Opioid dependence, uncomplicated: Secondary | ICD-10-CM | POA: Diagnosis not present

## 2017-06-03 DIAGNOSIS — F1120 Opioid dependence, uncomplicated: Secondary | ICD-10-CM | POA: Diagnosis not present

## 2017-06-04 DIAGNOSIS — M609 Myositis, unspecified: Secondary | ICD-10-CM | POA: Diagnosis not present

## 2017-06-04 DIAGNOSIS — R748 Abnormal levels of other serum enzymes: Secondary | ICD-10-CM | POA: Diagnosis not present

## 2017-06-04 DIAGNOSIS — F1120 Opioid dependence, uncomplicated: Secondary | ICD-10-CM | POA: Diagnosis not present

## 2017-06-05 DIAGNOSIS — F1120 Opioid dependence, uncomplicated: Secondary | ICD-10-CM | POA: Diagnosis not present

## 2017-06-06 DIAGNOSIS — F1120 Opioid dependence, uncomplicated: Secondary | ICD-10-CM | POA: Diagnosis not present

## 2017-06-07 DIAGNOSIS — F1120 Opioid dependence, uncomplicated: Secondary | ICD-10-CM | POA: Diagnosis not present

## 2017-06-10 DIAGNOSIS — F1120 Opioid dependence, uncomplicated: Secondary | ICD-10-CM | POA: Diagnosis not present

## 2017-06-11 DIAGNOSIS — F1120 Opioid dependence, uncomplicated: Secondary | ICD-10-CM | POA: Diagnosis not present

## 2017-06-12 DIAGNOSIS — F1120 Opioid dependence, uncomplicated: Secondary | ICD-10-CM | POA: Diagnosis not present

## 2017-06-13 DIAGNOSIS — F1120 Opioid dependence, uncomplicated: Secondary | ICD-10-CM | POA: Diagnosis not present

## 2017-06-14 DIAGNOSIS — F1120 Opioid dependence, uncomplicated: Secondary | ICD-10-CM | POA: Diagnosis not present

## 2017-06-15 DIAGNOSIS — F1120 Opioid dependence, uncomplicated: Secondary | ICD-10-CM | POA: Diagnosis not present

## 2017-06-19 DIAGNOSIS — F1120 Opioid dependence, uncomplicated: Secondary | ICD-10-CM | POA: Diagnosis not present

## 2017-06-20 DIAGNOSIS — F1120 Opioid dependence, uncomplicated: Secondary | ICD-10-CM | POA: Diagnosis not present

## 2017-06-21 DIAGNOSIS — F1120 Opioid dependence, uncomplicated: Secondary | ICD-10-CM | POA: Diagnosis not present

## 2017-06-22 DIAGNOSIS — F1120 Opioid dependence, uncomplicated: Secondary | ICD-10-CM | POA: Diagnosis not present

## 2017-06-23 DIAGNOSIS — F1120 Opioid dependence, uncomplicated: Secondary | ICD-10-CM | POA: Diagnosis not present

## 2017-06-24 DIAGNOSIS — F1120 Opioid dependence, uncomplicated: Secondary | ICD-10-CM | POA: Diagnosis not present

## 2017-06-25 DIAGNOSIS — F1120 Opioid dependence, uncomplicated: Secondary | ICD-10-CM | POA: Diagnosis not present

## 2017-06-26 DIAGNOSIS — F1120 Opioid dependence, uncomplicated: Secondary | ICD-10-CM | POA: Diagnosis not present

## 2017-06-27 DIAGNOSIS — F1120 Opioid dependence, uncomplicated: Secondary | ICD-10-CM | POA: Diagnosis not present

## 2017-06-28 DIAGNOSIS — F1120 Opioid dependence, uncomplicated: Secondary | ICD-10-CM | POA: Diagnosis not present

## 2017-06-29 DIAGNOSIS — F1120 Opioid dependence, uncomplicated: Secondary | ICD-10-CM | POA: Diagnosis not present

## 2017-06-30 DIAGNOSIS — F1120 Opioid dependence, uncomplicated: Secondary | ICD-10-CM | POA: Diagnosis not present

## 2017-07-01 DIAGNOSIS — F1120 Opioid dependence, uncomplicated: Secondary | ICD-10-CM | POA: Diagnosis not present

## 2017-07-02 DIAGNOSIS — F1120 Opioid dependence, uncomplicated: Secondary | ICD-10-CM | POA: Diagnosis not present

## 2017-07-03 DIAGNOSIS — F1120 Opioid dependence, uncomplicated: Secondary | ICD-10-CM | POA: Diagnosis not present

## 2017-07-04 DIAGNOSIS — F1120 Opioid dependence, uncomplicated: Secondary | ICD-10-CM | POA: Diagnosis not present

## 2017-07-05 DIAGNOSIS — F1120 Opioid dependence, uncomplicated: Secondary | ICD-10-CM | POA: Diagnosis not present

## 2017-07-06 DIAGNOSIS — F1120 Opioid dependence, uncomplicated: Secondary | ICD-10-CM | POA: Diagnosis not present

## 2017-07-07 DIAGNOSIS — F1120 Opioid dependence, uncomplicated: Secondary | ICD-10-CM | POA: Diagnosis not present

## 2017-07-08 DIAGNOSIS — F1120 Opioid dependence, uncomplicated: Secondary | ICD-10-CM | POA: Diagnosis not present

## 2017-07-10 DIAGNOSIS — F1120 Opioid dependence, uncomplicated: Secondary | ICD-10-CM | POA: Diagnosis not present

## 2017-07-11 DIAGNOSIS — F1120 Opioid dependence, uncomplicated: Secondary | ICD-10-CM | POA: Diagnosis not present

## 2017-07-12 DIAGNOSIS — F1120 Opioid dependence, uncomplicated: Secondary | ICD-10-CM | POA: Diagnosis not present

## 2017-07-15 DIAGNOSIS — F1120 Opioid dependence, uncomplicated: Secondary | ICD-10-CM | POA: Diagnosis not present

## 2017-07-31 DIAGNOSIS — S300XXA Contusion of lower back and pelvis, initial encounter: Secondary | ICD-10-CM | POA: Diagnosis not present

## 2017-07-31 DIAGNOSIS — M62830 Muscle spasm of back: Secondary | ICD-10-CM | POA: Diagnosis not present

## 2017-07-31 DIAGNOSIS — Z1389 Encounter for screening for other disorder: Secondary | ICD-10-CM | POA: Diagnosis not present

## 2017-08-12 DIAGNOSIS — Z79899 Other long term (current) drug therapy: Secondary | ICD-10-CM | POA: Diagnosis not present

## 2017-08-12 DIAGNOSIS — F1120 Opioid dependence, uncomplicated: Secondary | ICD-10-CM | POA: Diagnosis not present

## 2017-08-13 MED FILL — TESTOSTERONE CYP 200 MG/ML: 200 | 84 days supply | Qty: 12 | Fill #1

## 2017-08-20 DIAGNOSIS — M5416 Radiculopathy, lumbar region: Secondary | ICD-10-CM | POA: Diagnosis not present

## 2017-08-20 DIAGNOSIS — M5417 Radiculopathy, lumbosacral region: Secondary | ICD-10-CM | POA: Diagnosis not present

## 2017-08-20 DIAGNOSIS — F418 Other specified anxiety disorders: Secondary | ICD-10-CM | POA: Diagnosis not present

## 2017-08-20 DIAGNOSIS — M5412 Radiculopathy, cervical region: Secondary | ICD-10-CM | POA: Diagnosis not present

## 2017-08-27 DIAGNOSIS — M545 Low back pain: Secondary | ICD-10-CM | POA: Diagnosis not present

## 2017-09-07 DIAGNOSIS — B370 Candidal stomatitis: Secondary | ICD-10-CM | POA: Diagnosis not present

## 2017-09-25 DIAGNOSIS — M47816 Spondylosis without myelopathy or radiculopathy, lumbar region: Secondary | ICD-10-CM | POA: Insufficient documentation

## 2017-09-25 DIAGNOSIS — M5136 Other intervertebral disc degeneration, lumbar region: Secondary | ICD-10-CM | POA: Diagnosis not present

## 2017-10-10 DIAGNOSIS — M47816 Spondylosis without myelopathy or radiculopathy, lumbar region: Secondary | ICD-10-CM | POA: Diagnosis not present

## 2017-10-14 MED FILL — predniSONE 10 MG TABS: 10 | 6 days supply | Qty: 21 | Fill #0

## 2017-10-22 DIAGNOSIS — G894 Chronic pain syndrome: Secondary | ICD-10-CM | POA: Diagnosis not present

## 2017-10-22 MED FILL — predniSONE 50 MG TABS: 50 | 5 days supply | Qty: 5 | Fill #0

## 2017-10-22 MED FILL — BUPRENORPHINE 10 MCG/HR PTW: 10 | 28 days supply | Qty: 4 | Fill #0

## 2017-10-23 DIAGNOSIS — I10 Essential (primary) hypertension: Secondary | ICD-10-CM | POA: Diagnosis not present

## 2017-10-23 DIAGNOSIS — E119 Type 2 diabetes mellitus without complications: Secondary | ICD-10-CM | POA: Diagnosis not present

## 2017-10-23 MED FILL — HYDROCHLOROTHIAZIDE 25 MG T: 25 | 90 days supply | Qty: 90 | Fill #0

## 2017-10-26 ENCOUNTER — Other Ambulatory Visit: Payer: Self-pay

## 2017-10-26 ENCOUNTER — Encounter: Payer: Self-pay | Admitting: Physician Assistant

## 2017-10-26 ENCOUNTER — Ambulatory Visit: Payer: 59 | Admitting: Physician Assistant

## 2017-10-26 VITALS — BP 116/96 | HR 92 | Temp 97.5°F | Resp 16 | Ht 70.0 in | Wt 206.6 lb

## 2017-10-26 DIAGNOSIS — I10 Essential (primary) hypertension: Secondary | ICD-10-CM | POA: Diagnosis not present

## 2017-10-26 MED ORDER — AMLODIPINE BESYLATE 5 MG PO TABS: 5.0000 mg | ORAL_TABLET | Freq: Every day | ORAL | 0 refills | Status: DC

## 2017-10-26 MED ORDER — AMLODIPINE BESYLATE 5 MG PO TABS: 2.5000 mg | ORAL_TABLET | Freq: Every day | ORAL | 0 refills | Status: DC

## 2017-10-26 NOTE — Patient Instructions (Addendum)
When monitoring her blood pressure always ensure that you  have rested for at least 5 minutes quietly and you take the blood pressure at the level of the heart.  Having your wife check with the stethoscope this is the most accurate measure.   While you are taking 25 mg of hydrochlorothiazide daily and not more use the following algorithm to titrate your Norvasc.  If pressure is greater than 160 and/or 100 then I add 2.5 mg of Norvasc.  Give the medicine 3 to 4 days to work and if the pressure continues to remain over 160 and or 100 then go to 5 mg of Norvasc and again give the medicine 3 to 4 days to work.  If the pressure remains greater than 160 and/or 100 then you can add in another one half Norvasc to increase to a total of 7.5 mg.   IF you received an x-ray today, you will receive an invoice from [facility]. Please contact [facility] at [phone] with questions or concerns regarding your invoice.   IF you received labwork today, you will receive an invoice from [facility]. Please contact [facility] at [phone] with questions or concerns regarding your invoice.   Our billing staff will not be able to assist you with questions regarding bills from these companies.  You will be contacted with the lab results as soon as they are available. The fastest way to get your results is to activate your My Chart account. Instructions are located on the last page of this paperwork. If you have not heard from us regarding the results in 2 weeks, please contact this office.

## 2017-10-26 NOTE — Progress Notes (Signed)
10/26/2017 8:41 AM   DOB: 12-10-1965 / MRN: [id number]  SUBJECTIVE:  Joseph Ayala is a 52 y.o. male presenting for blood pressure problem.  Patient states that his blood pressure has been elevated despite taking 25 mg of hydrochlorothiazide daily per his doctor's orders.  He has several measures on his phone today that were taken by his wife who is a Engineer, civil (consulting)nurse at [employer] [facility], and he reviews these with me.  They are as follows: 155/110, 158/104.  He denies any chest pain, shortness of breath, DOE, headaches, vision changes with these elevated pressures.  He tells me that he told his doctor about this however the medication was not adjusted.  He would like to continue seeing his primary care doctor for the management of blood pressure.  He did take a total of 75 mg of hctz yesterday in an attempt to get his blood pressure control.  His pressure today looks great in the office.  He is allergic to strawberry (diagnostic) and vicodin [hydrocodone-acetaminophen].   He  has a past medical history of Back injury, Kidney stone, Kidney stones, and Varicose veins.    He  reports that he has never smoked. His smokeless tobacco use includes snuff. He reports that he does not drink alcohol or use drugs. He  reports that he currently engages in sexual activity. The patient  has a past surgical history that includes Rhinoplasty and Endovenous ablation saphenous vein w/ laser (Right, 12-29-2015).  His family history includes Varicose Veins in his brother.  ROS per HPI  The problem list and medications were reviewed and updated by myself where necessary and exist elsewhere in the encounter.   OBJECTIVE:  BP (!) 116/96 (BP Location: Right Arm)   Pulse 92   Temp (!) 97.5 F (36.4 C) (Oral)   Resp 16   Ht 5\' 10"  (1.778 m)   Wt 206 lb 9.6 oz (93.7 kg)   SpO2 98%   BMI 29.64 kg/m   Wt Readings from Last 3 Encounters:  10/26/17 206 lb 9.6 oz (93.7 kg)  05/19/16 215 lb (97.5 kg)  01/05/16 212 lb (96.2 kg)    Temp Readings from Last 3 Encounters:  10/26/17 (!) 97.5 F (36.4 C) (Oral)  05/19/16 98.2 F (36.8 C) (Oral)  01/05/16 98.3 F (36.8 C)   BP Readings from Last 3 Encounters:  10/26/17 (!) 116/96  05/19/16 (!) 141/106  01/05/16 (!) 146/100   Pulse Readings from Last 3 Encounters:  10/26/17 92  05/19/16 103  01/05/16 89     Physical Exam  Constitutional: He is oriented to person, place, and time. He appears well-developed. He does not appear ill.  Eyes: Pupils are equal, round, and reactive to light. Conjunctivae and EOM are normal.  Cardiovascular: Normal rate, regular rhythm, S1 normal, S2 normal, normal heart sounds, intact distal pulses and normal pulses. Exam reveals no gallop and no friction rub.  No murmur heard. Pulmonary/Chest: Effort normal and breath sounds normal. No stridor. No respiratory distress. He has no wheezes. He has no rales.  Abdominal: He exhibits no distension.  Musculoskeletal: Normal range of motion. He exhibits no edema.  Neurological: He is alert and oriented to person, place, and time. No cranial nerve deficit. Coordination normal.  Skin: Skin is warm and dry. He is not diaphoretic.  Psychiatric: He has a normal mood and affect.  Nursing note and vitals reviewed.     No results found for this or any previous visit (from the past 72  hour(s)).  No results found.  ASSESSMENT AND PLAN:  Joseph Ayala was seen today for medication refill.  Diagnoses and all orders for this visit:  Uncontrolled hypertension: See AVS.  I am starting him on a very low dose of Norvasc based off of his ambulatory measures.  His wife is a Engineer, civil (consulting) at home and can help him with monitoring.  Of advised that he follow-up with his primary care provider for further management of his blood pressure.      The patient is advised to call or return to clinic if he does not see an improvement in symptoms, or to seek the care of the closest emergency department if he worsens with the  above plan.   Deliah Boston, MHS, PA-C Primary [facility] at [facility] 10/26/2017 8:41 AM

## 2017-10-29 ENCOUNTER — Ambulatory Visit: Payer: 59 | Admitting: Family Medicine

## 2017-10-29 MED FILL — BELBUCA 150 MCG FILM: 150 | 30 days supply | Qty: 60 | Fill #0

## 2017-10-29 MED FILL — AMLODIPINE BESYLATE 5 MG TA: 5 | 90 days supply | Qty: 90 | Fill #0

## 2017-11-12 DIAGNOSIS — I10 Essential (primary) hypertension: Secondary | ICD-10-CM | POA: Diagnosis not present

## 2017-11-12 DIAGNOSIS — M545 Low back pain: Secondary | ICD-10-CM | POA: Diagnosis not present

## 2017-11-12 DIAGNOSIS — Z6828 Body mass index (BMI) 28.0-28.9, adult: Secondary | ICD-10-CM | POA: Diagnosis not present

## 2017-11-13 MED FILL — TESTOSTERONE CYP 200 MG/ML: 200 | 84 days supply | Qty: 12 | Fill #2

## 2017-11-26 MED FILL — BELBUCA 150 MCG FILM: 150 | 30 days supply | Qty: 60 | Fill #0

## 2017-11-28 ENCOUNTER — Encounter: Payer: Self-pay | Admitting: Family Medicine

## 2017-11-28 DIAGNOSIS — E663 Overweight: Secondary | ICD-10-CM | POA: Diagnosis not present

## 2017-11-28 DIAGNOSIS — M545 Low back pain: Secondary | ICD-10-CM | POA: Diagnosis not present

## 2017-11-28 DIAGNOSIS — R748 Abnormal levels of other serum enzymes: Secondary | ICD-10-CM | POA: Diagnosis not present

## 2017-11-28 DIAGNOSIS — Z6828 Body mass index (BMI) 28.0-28.9, adult: Secondary | ICD-10-CM | POA: Diagnosis not present

## 2017-11-28 DIAGNOSIS — R531 Weakness: Secondary | ICD-10-CM | POA: Diagnosis not present

## 2017-11-28 DIAGNOSIS — M5136 Other intervertebral disc degeneration, lumbar region: Secondary | ICD-10-CM | POA: Diagnosis not present

## 2017-11-28 DIAGNOSIS — R5383 Other fatigue: Secondary | ICD-10-CM | POA: Diagnosis not present

## 2017-11-28 DIAGNOSIS — M5432 Sciatica, left side: Secondary | ICD-10-CM | POA: Diagnosis not present

## 2017-11-29 ENCOUNTER — Encounter: Payer: Self-pay | Admitting: Neurology

## 2017-12-11 DIAGNOSIS — M5136 Other intervertebral disc degeneration, lumbar region: Secondary | ICD-10-CM | POA: Diagnosis not present

## 2017-12-11 DIAGNOSIS — G894 Chronic pain syndrome: Secondary | ICD-10-CM | POA: Diagnosis not present

## 2017-12-11 DIAGNOSIS — G8929 Other chronic pain: Secondary | ICD-10-CM | POA: Diagnosis not present

## 2017-12-11 MED FILL — predniSONE 50 MG TABS: 50 | 3 days supply | Qty: 3 | Fill #0

## 2017-12-11 MED FILL — BUPRENORPHIN-NALOXON 8-2 MG: 8-2 | 15 days supply | Qty: 30 | Fill #0

## 2017-12-17 ENCOUNTER — Encounter

## 2017-12-17 ENCOUNTER — Ambulatory Visit: Payer: 59 | Admitting: Neurology

## 2017-12-26 ENCOUNTER — Ambulatory Visit: Payer: 59 | Admitting: Neurology

## 2017-12-26 ENCOUNTER — Encounter: Payer: Self-pay | Admitting: Neurology

## 2017-12-26 VITALS — BP 120/80 | HR 108 | Ht 70.0 in | Wt 201.1 lb

## 2017-12-26 DIAGNOSIS — M62512 Muscle wasting and atrophy, not elsewhere classified, left shoulder: Secondary | ICD-10-CM

## 2017-12-26 DIAGNOSIS — R748 Abnormal levels of other serum enzymes: Secondary | ICD-10-CM

## 2017-12-26 DIAGNOSIS — M6281 Muscle weakness (generalized): Secondary | ICD-10-CM

## 2017-12-26 NOTE — Progress Notes (Signed)
[facility] Clinic Note - Initial Visit   Date: 12/26/2017  Martese Vanatta Olesky MRN: [id number] DOB: 05/03/1966   Dear Dr. Trudie Reed:  Thank you for your kind referral of Shadd T Sanagustin for consultation of hyperCKemia. Although his history is well known to you, please allow [place] to reiterate it for the purpose of our medical record. The patient was accompanied to the clinic by self.   History of Present Illness: Marvion T Fettes is a 52 y.o. right-handed Caucasian male with hypertension, chronic low back pain, and low testosterone and history opioid addiction, presenting for evaluation of hyperCKemia.    Around ~2008, he started having fatigue and his CK levels.  Over the following 5 years, he noticed reduced stamina and worsening fatigue.  He started seeing Dr. Trudie Reed in 2015 who suggested muscle biopsy, but this was declined by patient as he was otherwise asymptomatic.    Around the same time, he began having chronic stabbing pain in the low back.  He has been seeing Dr. Nelva Bush for [facility] which was effective briefly.  Since October 2018, he started having worsening low back pain which would limit his ability to function.  He feels that even if his pain was well-controlled, he still gets fatigued within 10-15 minutes and developed soreness and achy pain of the arms and legs.  He feels loss of muscle mass over the arms in the past 6 months.  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 Recent MRI lumbar spine from February 2019 showed progression of fat atrophy in the paraspinal muscles.   He has noticed greater difficulty opening bottles, climbing stairs, and exertional dyspnea.  He sleeps on one pillow.  He gets cramps in the lower legs.    He denies double vision, difficulty swallowing, speech changes, or dark colored urine.   His parents are both living and healthy.  He has three older brothers, one of which had open  heart surgery at the age of 39, and no complications since then.  He has one 35 year old daughter who is healthy.  No family history of neuromuscular disorders.     Out-side paper records, electronic medical record, and images have been reviewed where available and summarized as:  MRI lumbar spine 08/27/2017: 1.  Mild L2-3 spinal canal stenosis with mild narrowing of subarticular zones, progressed. Mild left neural foraminal narrowing, progressed. 2.  Mild bilateral L4-5 and mild right L5-S1 neural foraminal narrowing, not significantly changed. 3.  Interval progression in fatty replacement and atrophy of erector spinae muscles, mostly along the longissmus muscles. 4.  Degenerative minimal anterolisthesis of L4 on L5.  Labs 11/28/2017: CK 1923, aldolase 15.6, TSH 1.2, CBC within normal limits, ESR 6, CRP 0.7, anti-RNP 2.6, hepatitis panel negative  Past Medical History:  Diagnosis Date  . Back injury   . Kidney stone   . Kidney stones   . Varicose veins     Past Surgical History:  Procedure Laterality Date  . ENDOVENOUS ABLATION SAPHENOUS VEIN W/ LASER Right 12-29-2015   endovenous laser ablation right greater saphenous vein, stab phlebectomy > 20 incisions right leg, sclerotherapy right leg by Curt Jews MD    . RHINOPLASTY       Medications:  Outpatient Encounter Medications as of 12/26/2017  Medication Sig Note  . amLODipine (NORVASC) 5 MG tablet Take 0.5-1.5 tablets (2.5-7.5 mg total) by mouth daily. Per ambulatory monitoring.   Marland Kitchen  buprenorphine (BUTRANS - DOSED MCG/HR) 10 MCG/HR PTWK patch Place 10 mcg onto the skin once a week.   . buprenorphine-naloxone (SUBOXONE) 8-2 mg SUBL SL tablet PLACE 1 TABLET UNDER THE TONGUE TWICE A DAY AS DIRECTED   . cyclobenzaprine (FLEXERIL) 10 MG tablet cyclobenzaprine 10 mg tablet  TAKE 1 TABLET BY MOUTH THREE TIMES A DAY   . hydrochlorothiazide (HYDRODIURIL) 25 MG tablet Take 25 mg by mouth daily.   Marland Kitchen ibuprofen (ADVIL,MOTRIN) 800 MG tablet Take  1 tablet (800 mg total) by mouth 3 (three) times daily.   . meloxicam (MOBIC) 15 MG tablet Take 15 mg by mouth daily.   . predniSONE (DELTASONE) 50 MG tablet Take 50 mg by mouth daily with breakfast. 10/26/2017: For 5 days  . testosterone cypionate (DEPO-TESTOSTERONE) 200 MG/ML injection Inject 200 mg into the muscle every 14 (fourteen) days.    No facility-administered encounter medications on file as of 12/26/2017.      Allergies:  Allergies  Allergen Reactions  . Strawberry (Diagnostic)     hives  . Vicodin [Hydrocodone-Acetaminophen] Nausea And Vomiting    Family History: Family History  Problem Relation Age of Onset  . Varicose Veins Brother   Mother and father are healthy.  Brother had heart condition requiring surgery at the age of 55. No history of neuromuscular disorder, i.e. muscular dystrophy.   Social History: Social History   Tobacco Use  . Smoking status: Never Smoker  . Smokeless tobacco: Current User    Types: Snuff  Substance Use Topics  . Alcohol use: No    Alcohol/week: 0.0 oz  . Drug use: No   Social History   Social History Narrative   Lives with wife and daughter in a one story home.  Has one child.  Currently not working.  Education: some college.     Review of Systems:  CONSTITUTIONAL: No fevers, chills, night sweats, +weight loss.   EYES: No visual changes or eye pain ENT: No hearing changes.  No history of nose bleeds.   RESPIRATORY: No cough, wheezing +shortness of breath.   CARDIOVASCULAR: Negative for chest pain, and palpitations.   GI: Negative for abdominal discomfort, blood in stools or black stools.  No recent change in bowel habits.   GU:  No history of incontinence.   MUSCLOSKELETAL: +history of joint pain or swelling.  +myalgias.   SKIN: Negative for lesions, rash, and itching.   HEMATOLOGY/ONCOLOGY: Negative for prolonged bleeding, bruising easily, and swollen nodes.  No history of cancer.   ENDOCRINE: Negative for cold or heat  intolerance, polydipsia or goiter.   PSYCH:  No depression or anxiety symptoms.   NEURO: As Above.   Vital Signs:  BP 120/80   Pulse (!) 108   Ht '5\' 10"'  (1.778 m)   Wt 201 lb 2 oz (91.2 kg)   SpO2 95%   BMI 28.86 kg/m    General Medical Exam:   General:  Well appearing, comfortable.    Eyes/ENT: see cranial nerve examination.   Neck: No masses appreciated.  Full range of motion without tenderness.  No carotid bruits. Respiratory:  Clear to auscultation, good air entry bilaterally.   Cardiac:  Regular rate and rhythm, no murmur.   Extremities:  No deformities, edema, or skin discoloration.  Skin:  No rashes or lesions.  Neurological Exam: MENTAL STATUS including orientation to time, place, person, recent and remote memory, attention span and concentration, language, and fund of knowledge is normal.  Speech is not dysarthric.  CRANIAL NERVES: II:  No visual field defects.  Unremarkable fundi.   III-IV-VI: Pupils equal round and reactive to light.  Normal conjugate, extra-ocular eye movements in all directions of gaze.  No nystagmus.  Mild bilateral ptosis at rest without sustained upgaze.   V:  Normal facial sensation.   VII:  Normal facial symmetry and movements.  No pathologic facial reflexes. There is mild temporal wasting and facial atrophy, cheeks are sunken. VIII:  Normal hearing and vestibular function.   IX-X:  Normal palatal movement.   XI:  Normal shoulder shrug and head rotation.   XII:  Normal tongue strength and range of motion, no deviation or fasciculation.  MOTOR:  Moderate supraspinatus and mild infraspinatus atrophy.  No fasciculations or abnormal movements.  No pronator drift.  Tone is normal.  There is no grip or percussion myotonia Neck flexion and extension is 4/5.  Right Upper Extremity:    Left Upper Extremity:    Deltoid  5/5   Deltoid  5-/5   Biceps  5/5   Biceps  5-/5   Infraspinatus 4/5  Infraspinatus 4/5  Medial pectoralis 5/5  Medial pectoralis  5/5  Triceps  5/5   Triceps  5-/5   Wrist extensors  5-/5   Wrist extensors  5-/5   Wrist flexors  5-/5   Wrist flexors  5-/5   Finger extensors  5-/5   Finger extensors  5-/5   Finger flexors  5-/5   Finger flexors  5-/5   Dorsal interossei  4+/5   Dorsal interossei  4+/5   Abductor pollicis  4+/5   Abductor pollicis  4+/5   Tone (Ashworth scale)  0  Tone (Ashworth scale)  0   Right Lower Extremity:    Left Lower Extremity:    Hip flexors  4+/5   Hip flexors  4+/5   Hip extensors  5-/5   Hip extensors  5-/5   Knee flexors  5-/5   Knee flexors  5-/5   Knee extensors  5/5   Knee extensors  5/5   Dorsiflexors  4+/5   Dorsiflexors  4+/5   Plantarflexors  4+/5   Plantarflexors  4+/5   Toe extensors  4+/5   Toe extensors  4+/5   Toe flexors  4+/5   Toe flexors  4+/5   Tone (Ashworth scale)  0  Tone (Ashworth scale)  0   MSRs:  Right                                                                 Left brachioradialis 2+  brachioradialis 2+  biceps 2+  biceps 2+  triceps 2+  triceps 2+  patellar 2+  patellar 2+  ankle jerk 2+  ankle jerk 2+  Hoffman no  Hoffman no  plantar response down  plantar response down   SENSORY:  Patchy area of sensory loss at the mid-lumbar level to pin prick, otherwise, normal and symmetric perception of light touch, pinprick, vibration, and proprioception.  Romberg's sign absent.   COORDINATION/GAIT: Normal finger-to- nose-finger.  Intact rapid alternating movements bilaterally.  Able to rise from a chair without using arms.  Gait narrow based and stable. Difficulty with tandem and stressed gait, but able to perform.    IMPRESSION: Mr.  Suder is a 52 year-old man with chronic low back pain referred for evaluation of hyperCKemia.  On exam, there is generalized pattern of weakness with atrophy of the face and supraspinatus, as well as weakness of the facial muscles, axial muscles, hip flexors, and distal hand and feet.  Weakness is more pronounced on the left  side.  His last CK was 1923, I do not have previous labs to see what his trend has been over the years.  MRI lumbar spine shows paraspinal muscle atrophy which is indicative of underlying myopathy.  With his inflammatory markers being normal and his insidious onset, symptoms are unlikely to be an inflammatory/autoimmune myopathy.  His asymmetrical presentation involving face, periscapular muscles, and distal pattern, makes muscular dystrophy is high on the differential, especially fascioscapulohumeral dystrophy.  There is no evidence of myotonia to suggest myotonic dystrophy, but this cannot be excluded. He has no family history of neuromuscular disease.  First step would be to pursue NCS/EMG to better understand the distribution of his myopathy, which may guide future biopsy.  I have also requested him to being CD with images so I can review personally.    Greater than 50% of this 60 minute visit was spent in counseling, explanation of diagnosis, planning of further management, and coordination of care.   Thank you for allowing me to participate in patient's care.  If I can answer any additional questions, I would be pleased to do so.    Sincerely,    Shamere Dilworth K. Posey Pronto, DO

## 2017-12-26 NOTE — Patient Instructions (Addendum)
I would like to review your images. Please drop off your images on CD at my office, at your convenience.  I will see you back on July 16th at 11am for NCS/EMG left arm and leg  ELECTROMYOGRAM AND NERVE CONDUCTION STUDIES (EMG/NCS) INSTRUCTIONS  How to Prepare The neurologist conducting the EMG will need to know if you have certain medical conditions. Tell the neurologist and other EMG lab personnel if you: . Have a pacemaker or any other electrical medical device . Take blood-thinning medications . Have hemophilia, a blood-clotting disorder that causes prolonged bleeding Bathing Take a shower or bath shortly before your exam in order to remove oils from your skin. Don't apply lotions or creams before the exam.  What to Expect You'll likely be asked to change into a hospital gown for the procedure and lie down on an examination table. The following explanations can help you understand what will happen during the exam.  . Electrodes. The neurologist or a technician places surface electrodes at various locations on your skin depending on where you're experiencing symptoms. Or the neurologist may insert needle electrodes at different sites depending on your symptoms.  . Sensations. The electrodes will at times transmit a tiny electrical current that you may feel as a twinge or spasm. The needle electrode may cause discomfort or pain that usually ends shortly after the needle is removed. If you are concerned about discomfort or pain, you may want to talk to the neurologist about taking a short break during the exam.  . Instructions. During the needle EMG, the neurologist will assess whether there is any spontaneous electrical activity when the muscle is at rest - activity that isn't present in healthy muscle tissue - and the degree of activity when you slightly contract the muscle.  He or she will give you instructions on resting and contracting a muscle at appropriate times. Depending on what muscles  and nerves the neurologist is examining, he or she may ask you to change positions during the exam.  After your EMG You may experience some temporary, minor bruising where the needle electrode was inserted into your muscle. This bruising should fade within several days. If it persists, contact your primary care doctor.

## 2017-12-27 DIAGNOSIS — R748 Abnormal levels of other serum enzymes: Secondary | ICD-10-CM | POA: Insufficient documentation

## 2017-12-27 DIAGNOSIS — G7289 Other specified myopathies: Secondary | ICD-10-CM | POA: Insufficient documentation

## 2017-12-27 DIAGNOSIS — M6281 Muscle weakness (generalized): Secondary | ICD-10-CM | POA: Insufficient documentation

## 2018-01-06 DIAGNOSIS — M6281 Muscle weakness (generalized): Secondary | ICD-10-CM

## 2018-01-06 DIAGNOSIS — M6250 Muscle wasting and atrophy, not elsewhere classified, unspecified site: Secondary | ICD-10-CM

## 2018-01-06 HISTORY — DX: Muscle wasting and atrophy, not elsewhere classified, unspecified site: M62.50

## 2018-01-06 HISTORY — DX: Muscle weakness (generalized): M62.81

## 2018-01-06 MED FILL — BUPRENORPHIN-NALOXON 8-2 MG: 8-2 | 15 days supply | Qty: 30 | Fill #0

## 2018-01-07 ENCOUNTER — Ambulatory Visit (INDEPENDENT_AMBULATORY_CARE_PROVIDER_SITE_OTHER): Payer: 59 | Admitting: Neurology

## 2018-01-07 DIAGNOSIS — R748 Abnormal levels of other serum enzymes: Secondary | ICD-10-CM | POA: Diagnosis not present

## 2018-01-07 NOTE — Procedures (Signed)
[facility]  [address], [address]  [place], [place] [address] Tel: [phone] Fax:  [phone] Test Date:  01/07/2018  Patient: Joseph Ayala DOB: 01-Sep-1965 Physician: Nita Sickleonika Patel, DO  Sex: Male Height: 5\' 9"  Ref Phys: Nita Sickleonika Patel, DO  ID#: [id number] Temp: 32.0C Technician:    Patient Complaints: This is a [age] year old man referred for evaluation of generalized weakness and hyperCKemia.  NCV & EMG Findings: Extensive electrodiagnostic testing of the left upper and lower extremities shows:  1. Left median and ulnar sensory responses are mildly prolonged (3.7, 3.7 ms).  Left sural and superficial peroneal sensory responses are within normal limits. 2. Left median motor response shows prolonged distal onset latency (4.1 ms).  Left ulnar motor response shows prolonged distal onset latency (3.2 ms) and decreased conduction velocity across the elbow (A Elbow-B Elbow, 45 m/s).  Left peroneal motor responses within normal limits.  3. Sparse small and complex motor units are seen in the deltoid, gluteus medius, infraspinatus, and lumbar paraspinal muscles. There is no evidence of active denervation or necrotizing features.  4. In the left lower extremity, chronic motor axon loss changes are seen affecting the anterior tibialis and rectus femoris muscles.   Impression: 1. 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  2. Probable intraspinal canal lesion at the left L4 nerve root/segment.  3. Left median neuropathy at or distal to the wrist, consistent with clinical diagnosis of carpal tunnel syndrome. Overall, these findings are mild to moderate in degree electrically. 4. Left ulnar neuropathy with slowing across the elbow, purely demyelinating in type and mild in degree electrically.   ___________________________ Nita Sickleonika Patel, DO    Nerve Conduction Studies Anti Sensory Summary Table   Site NR Peak (ms) Norm Peak (ms) P-T Amp  (V) Norm P-T Amp  Left Median Anti Sensory (2nd Digit)  Wrist    3.7 <3.6 23.0 >15  Left Sup Peroneal Anti Sensory (Ant Lat Mall)  12 cm    3.3 <4.6 15.4 >4  Left Sural Anti Sensory (Lat Mall)  Calf    3.3 <4.6 21.2 >4  Left Ulnar Anti Sensory (5th Digit)  Wrist    3.7 <3.1 13.4 >10   Motor Summary Table   Site NR Onset (ms) Norm Onset (ms) O-P Amp (mV) Norm O-P Amp Site1 Site2 Delta-0 (ms) Dist (cm) Vel (m/s) Norm Vel (m/s)  Left Median Motor (Abd Poll Brev)  Wrist    4.1 <4.0 12.5 >6 Elbow Wrist 5.4 28.0 52 >50  Elbow    9.5  12.0         Left Peroneal Motor (Ext Dig Brev)  Ankle    5.2 <6.0 2.5 >2.5 B Fib Ankle 7.8 39.0 50 >40  B Fib    13.0  2.1  Poplt B Fib 1.3 9.0 69 >40  Poplt    14.3  2.0         Left Peroneal TA Motor (Tib Ant)  Fib Head    2.3 <4.5 7.4 >3 Poplit Fib Head 1.5 9.0 60 >40  Poplit    3.8  7.1         Left Ulnar Motor (Abd Dig Minimi)  Wrist    3.2 <3.1 8.1 >7 B Elbow Wrist 3.9 25.0 64 >50  B Elbow    7.1  7.5  A Elbow B Elbow 2.2 10.0 45 >50  A Elbow    9.3  7.5  EMG   Side Muscle Ins Act Fibs Psw Fasc Number Recrt Dur Dur. Amp Amp. Poly Poly. Comment  Left AntTibialis Nml Nml Nml Nml 1- Rapid Some 1+ Some 1+ Nml Nml N/A  Left Gastroc Nml Nml Nml Nml Nml Nml Nml Nml Nml Nml Nml Nml N/A  Left RectFemoris Nml Nml Nml Nml 1- Rapid Some 1+ Some 1+ Nml Nml N/A  Left Iliacus Nml Nml Nml Nml Nml Nml Nml Nml Nml Nml Nml Nml N/A  Left GluteusMed Nml Nml Nml Nml Nml Nml Some 1+ Some 1- Some 1+ N/A  Left BicepsFemS Nml Nml Nml Nml Nml Nml Nml Nml Nml Nml Nml Nml N/A  Left 1stDorInt Nml Nml Nml Nml Nml Nml Nml Nml Nml Nml Nml Nml N/A  Left FlexPolLong Nml Nml Nml Nml Nml Nml Nml Nml Nml Nml Nml Nml N/A  Left BrachioRad Nml Nml Nml Nml Nml Nml Nml Nml Nml Nml Nml Nml N/A  Left PronatorTeres Nml Nml Nml Nml Nml Nml Nml Nml Nml Nml Nml Nml N/A  Left Biceps Nml Nml Nml Nml Nml Nml Nml Nml Nml Nml Nml Nml N/A  Left Triceps Nml Nml Nml Nml Nml Nml Nml Nml Nml  Nml Nml Nml N/A  Left Deltoid Nml Nml Nml Nml Nml Nml Some 1+ Some 1- Some 1+ N/A  Left Infraspinatus Nml Nml Nml Nml Nml Nml Some 1+ Some 1- Some 1+ N/A  Left Cervical Parasp Low Nml Nml Nml Nml Nml Nml Nml Nml Nml Nml Nml Nml N/A  Left Lumbo Parasp Low Nml Nml Nml Nml Nml Nml Some 1+ Some 1- Some 1+ N/A      Waveforms:

## 2018-01-08 ENCOUNTER — Other Ambulatory Visit: Payer: Self-pay | Admitting: *Deleted

## 2018-01-08 ENCOUNTER — Ambulatory Visit: Payer: 59 | Admitting: Neurology

## 2018-01-08 ENCOUNTER — Encounter: Payer: Self-pay | Admitting: Neurology

## 2018-01-08 VITALS — BP 148/90 | HR 96 | Ht 70.0 in | Wt 200.2 lb

## 2018-01-08 DIAGNOSIS — M6281 Muscle weakness (generalized): Secondary | ICD-10-CM | POA: Diagnosis not present

## 2018-01-08 DIAGNOSIS — G729 Myopathy, unspecified: Secondary | ICD-10-CM | POA: Diagnosis not present

## 2018-01-08 DIAGNOSIS — M62512 Muscle wasting and atrophy, not elsewhere classified, left shoulder: Secondary | ICD-10-CM

## 2018-01-08 DIAGNOSIS — R748 Abnormal levels of other serum enzymes: Secondary | ICD-10-CM | POA: Diagnosis not present

## 2018-01-08 DIAGNOSIS — R292 Abnormal reflex: Secondary | ICD-10-CM

## 2018-01-08 MED ORDER — DIAZEPAM 5 MG PO TABS: ORAL_TABLET | ORAL | 0 refills | Status: DC

## 2018-01-08 NOTE — Patient Instructions (Signed)
MRI cervical spine wwo contrast will be ordered  We will refer you for muscle biopsy

## 2018-01-08 NOTE — Progress Notes (Signed)
Follow-up Visit   Date: 01/08/18    Gregeory Diangelo Parzych MRN: [id number] DOB: [birth year]   Interim History: Diquan T Maue is a 52 y.o. right-handed Caucasian male with hypertension, chronic low back pain, and low testosterone and history opioid addiction returning to the clinic for follow-up of hyperCKemia and generalized weakness.  The patient was accompanied to the clinic by wife who also provides collateral information.    History of present illness: Around ~2008, he started having fatigue and his CK levels.  Over the following 5 years, he noticed reduced stamina and worsening fatigue.  He started seeing Dr. Nickola Major in 2015 who suggested muscle biopsy, but this was declined by patient as he was otherwise asymptomatic.    Around the same time, he began having chronic stabbing pain in the low back.  He has been seeing Dr. Ethelene Hal for [facility] which was effective briefly.  Since October 2018, he started having worsening low back pain which would limit his ability to function.  He feels that even if his pain was well-controlled, he still gets fatigued within 10-15 minutes and developed soreness and achy pain of the arms and legs.  He feels loss of muscle mass over the arms in the past 6 months.  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 Recent MRI lumbar spine from February 2019 showed progression of fat atrophy in the paraspinal muscles.   He has noticed greater difficulty opening bottles, climbing stairs, and exertional dyspnea.  He sleeps on one pillow.  He gets cramps in the lower legs.    He denies double vision, difficulty swallowing, speech changes, or dark colored urine.   His parents are both living and healthy.  He has three older brothers, one of which had open heart surgery at the age of 38, and no complications since then.  He has one 68 year old daughter who is healthy.  No family history of neuromuscular disorders.     UPDATE 01/08/2018:  He is here with his wife to discuss results of his NCS/EMG.  His weakness is unchanged and continues to involve his arms and legs.  Wife states that the size of his muscles have significantly reduced over the past few years, especially calf muscles.    Medications:  Current Outpatient Medications on File Prior to Visit  Medication Sig Dispense Refill  . amLODipine (NORVASC) 5 MG tablet Take 0.5-1.5 tablets (2.5-7.5 mg total) by mouth daily. Per ambulatory monitoring. 90 tablet 0  . buprenorphine (BUTRANS - DOSED MCG/HR) 10 MCG/HR PTWK patch Place 10 mcg onto the skin once a week.    . buprenorphine-naloxone (SUBOXONE) 8-2 mg SUBL SL tablet PLACE 1 TABLET UNDER THE TONGUE TWICE A DAY AS DIRECTED  0  . cyclobenzaprine (FLEXERIL) 10 MG tablet cyclobenzaprine 10 mg tablet  TAKE 1 TABLET BY MOUTH THREE TIMES A DAY    . hydrochlorothiazide (HYDRODIURIL) 25 MG tablet Take 25 mg by mouth daily.    Marland Kitchen ibuprofen (ADVIL,MOTRIN) 800 MG tablet Take 1 tablet (800 mg total) by mouth 3 (three) times daily. 21 tablet 0  . meloxicam (MOBIC) 15 MG tablet Take 15 mg by mouth daily.  0  . predniSONE (DELTASONE) 50 MG tablet Take 50 mg by mouth daily with breakfast.    . testosterone cypionate (DEPO-TESTOSTERONE) 200 MG/ML injection Inject 200 mg into the muscle every 14 (fourteen) days.     No current facility-administered medications on  file prior to visit.     Allergies:  Allergies  Allergen Reactions  . Strawberry (Diagnostic)     hives  . Vicodin [Hydrocodone-Acetaminophen] Nausea And Vomiting    Review of Systems:  CONSTITUTIONAL: No fevers, chills, night sweats, or weight loss.  EYES: No visual changes or eye pain ENT: No hearing changes.  No history of nose bleeds.   RESPIRATORY: No cough, wheezing and shortness of breath.   CARDIOVASCULAR: Negative for chest pain, and palpitations.   GI: Negative for abdominal discomfort, blood in stools or black stools.  No recent change in  bowel habits.   GU:  No history of incontinence.   MUSCLOSKELETAL: +history of joint pain or swelling.  +myalgias.   SKIN: Negative for lesions, rash, and itching.   ENDOCRINE: Negative for cold or heat intolerance, polydipsia or goiter.   PSYCH:  no depression or anxiety symptoms.   NEURO: As Above.   Vital Signs:  BP (!) 148/90   Pulse 96   Ht 5\' 10"  (1.778 m)   Wt 200 lb 4 oz (90.8 kg)   SpO2 97%   BMI 28.73 kg/m   General Medical Exam:   General:  Well appearing, comfortable  Eyes/ENT: see cranial nerve examination.   Neck: No masses appreciated.  Full range of motion without tenderness.  No carotid bruits. Respiratory:  Clear to auscultation, good air entry bilaterally.   Cardiac:  Regular rate and rhythm, no murmur.   Ext:  No edema  Neurological Exam: MENTAL STATUS including orientation to time, place, person, recent and remote memory, attention span and concentration, language, and fund of knowledge is normal.  Speech is not dysarthric.  CRANIAL NERVES: Pupils equal round and reactive to light.  Normal conjugate, extra-ocular eye movements in all directions of gaze.  Mild bilateral ptosis.  There is mild temporal wasting and sunken cheeks.  Face is symmetric. Palate elevates symmetrically.  Tongue is midline.  MOTOR:  Moderate supraspinatus and mild infraspinatus atrophy, worse on the left.  No fasciculations or abnormal movements.  No pronator drift.  Tone is normal.   Neck flexion and extension is 4/5.  Right Upper Extremity:    Left Upper Extremity:    Deltoid  5/5   Deltoid  5-/5   Biceps  5/5   Biceps  5-/5   Infraspinatus 4/5  Infraspinatus 4/5  Medial pectoralis 5/5  Medial pectoralis 5/5  Triceps  5/5   Triceps  5-/5   Wrist extensors  5-/5   Wrist extensors  5-/5   Wrist flexors  5-/5   Wrist flexors  5-/5   Finger extensors  5-/5   Finger extensors  5-/5   Finger flexors  5-/5   Finger flexors  5-/5   Dorsal interossei  4+/5   Dorsal  interossei  4+/5   Abductor pollicis  4+/5   Abductor pollicis  4+/5   Tone (Ashworth scale)  0  Tone (Ashworth scale)  0   Right Lower Extremity:    Left Lower Extremity:    Hip flexors  5-/5   Hip flexors  4+/5   Hip extensors  5-/5   Hip extensors  5-/5   Knee flexors  5-/5   Knee flexors  5-/5   Knee extensors  5/5   Knee extensors  5/5   Dorsiflexors  4+/5   Dorsiflexors  4+/5   Plantarflexors  4+/5   Plantarflexors  4+/5   Toe extensors  4+/5   Toe extensors  4+/5  Toe flexors  4+/5   Toe flexors  4+/5   Tone (Ashworth scale)  0  Tone (Ashworth scale)  0   MSRs:    Right                                                                 Left brachioradialis 2+  brachioradialis 2+  biceps 3+  biceps 3+  triceps 2+  triceps 2+  patellar 2+  patellar 2+  ankle jerk 2+  ankle jerk 2+   COORDINATION/GAIT: Normal finger-to- nose-finger.  Intact rapid alternating movements bilaterally.  Able to rise from a chair without using arms.  Gait narrow based and stable. Difficulty with tandem and stressed gait, but able to perform.    Data: MRI lumbar spine 08/27/2017: 1.  Mild L2-3 spinal canal stenosis with mild narrowing of subarticular zones, progressed. Mild left neural foraminal narrowing, progressed. 2.  Mild bilateral L4-5 and mild right L5-S1 neural foraminal narrowing, not significantly changed. 3.  Interval progression in fatty replacement and atrophy of erector spinae muscles, mostly along the longissmus muscles. 4.  Degenerative minimal anterolisthesis of L4 on L5.  Labs 11/28/2017: CK 1923, aldolase 15.6, TSH 1.2, CBC within normal limits, ESR 6, CRP 0.7, anti-RNP 2.6, hepatitis panel negative  NCS/EMG of the left side 01/07/3018: 1. 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  2. Probable intraspinal canal lesion at the left L4 nerve root/segment.  3. Left median neuropathy at or distal to the wrist, consistent  with clinical diagnosis of carpal tunnel syndrome. Overall, these findings are mild to moderate in degree electrically. 4. Left ulnar neuropathy with slowing across the elbow, purely demyelinating in type and mild in degree electrically.  IMPRESSION/PLAN: Mr. Macmurray is a 52 year old man with chronic low back pain and hyperCKemia for follow-up of generalized weakness. His last CK from May 2019 was 1923.   I personally viewed his MRI lumbar spine which shows paraspinal muscles atrophy and fatty infiltrate.  NCS/EMG of the left side was performed yesterday and shows sparse non-necrotizing myopathic changes affecting the proximal muscles.  The next step his muscle biopsy of the right deltoid and rectus femoris muscle.  MRI cervical spine will be ordered to evaluate for overlapping canal stenosis as his reflexes are brisk in the arms, and his EMG findings were not as prominent as I would have expected based on his weakness.  Based on his history and exam involving face, periscapular muscles, and distal pattern, makes muscular dystrophy is high on the differential.  He also has mild left carpal tunnel syndrome and ulnar neuropathy, but denies paresthesias.  Strategies to minimize hyperflexion at the wrist and elbow was discussed.    PLAN/RECOMMENDATIONS:  MRI cervical spine wwo contrast to look for cervical canal stenosis, contrasted study is ordered to look for myopathic changes Referral to general surgery for right deltoid and rectus femoris biopsy  Further recommendations pending results.    Thank you for allowing me to participate in patient's care.  If I can answer any additional questions, I would be pleased to do so.    Sincerely,    Jaleah Lefevre K. Allena Katz, DO

## 2018-01-08 NOTE — Progress Notes (Signed)
Referral sent 

## 2018-01-15 MED FILL — HYDROCHLOROTHIAZIDE 25 MG T: 25 | 90 days supply | Qty: 90 | Fill #1

## 2018-01-19 ENCOUNTER — Ambulatory Visit
Admission: RE | Admit: 2018-01-19 | Discharge: 2018-01-19 | Disposition: A | Discharge status: Home / Self Care | Payer: 59 | Source: Ambulatory Visit | Attending: Neurology | Admitting: Neurology

## 2018-01-19 DIAGNOSIS — M6281 Muscle weakness (generalized): Secondary | ICD-10-CM

## 2018-01-19 DIAGNOSIS — G729 Myopathy, unspecified: Secondary | ICD-10-CM

## 2018-01-19 DIAGNOSIS — R292 Abnormal reflex: Secondary | ICD-10-CM

## 2018-01-21 ENCOUNTER — Encounter: Payer: 59 | Admitting: Neurology

## 2018-01-27 ENCOUNTER — Telehealth: Payer: Self-pay | Admitting: *Deleted

## 2018-01-27 NOTE — Telephone Encounter (Signed)
-----   Message from Glendale Chardonika K Patel, DO sent at 01/24/2018  8:35 AM EDT ----- Please follow-up on his MRI. Thanks  ----- Message ----- From: SYSTEM Sent: 01/24/2018  12:07 AM To: Glendale Chardonika K Patel, DO

## 2018-01-27 NOTE — Telephone Encounter (Signed)
Patient is scheduled for 01/07/18

## 2018-02-03 ENCOUNTER — Encounter (HOSPITAL_BASED_OUTPATIENT_CLINIC_OR_DEPARTMENT_OTHER): Payer: Self-pay | Admitting: Surgery

## 2018-02-03 ENCOUNTER — Telehealth: Payer: Self-pay | Admitting: Neurology

## 2018-02-03 ENCOUNTER — Other Ambulatory Visit: Payer: Self-pay | Admitting: Physician Assistant

## 2018-02-03 ENCOUNTER — Ambulatory Visit: Payer: Self-pay | Admitting: Surgery

## 2018-02-03 ENCOUNTER — Other Ambulatory Visit: Payer: Self-pay | Admitting: *Deleted

## 2018-02-03 ENCOUNTER — Inpatient Hospital Stay
Admission: RE | Admit: 2018-02-03 | Discharge: 2018-02-03 | Disposition: A | Discharge status: Home / Self Care | Payer: 59 | Source: Ambulatory Visit | Attending: Neurology | Admitting: Neurology

## 2018-02-03 DIAGNOSIS — M6281 Muscle weakness (generalized): Secondary | ICD-10-CM | POA: Diagnosis not present

## 2018-02-03 DIAGNOSIS — M6250 Muscle wasting and atrophy, not elsewhere classified, unspecified site: Secondary | ICD-10-CM | POA: Diagnosis not present

## 2018-02-03 MED ORDER — DIAZEPAM 5 MG PO TABS: ORAL_TABLET | ORAL | 0 refills | Status: DC

## 2018-02-03 MED FILL — diazePAM 5 MG TABS: 5 | 2 days supply | Qty: 2 | Fill #0

## 2018-02-03 NOTE — H&P (Signed)
General Surgery [facility]  Joseph Ayala DOB: [birth year] Married / Language: English / Race: White Male   History of Present Illness  The patient is a 52 year old male who presents with a complaint of Muscle weakness.  CHIEF COMPLAINT: muscle atrophy, for muscle biopsies  Patient is referred by Dr. Nita Sickleonika Patel from neurology for surgical evaluation for muscle biopsy of the deltoid muscle and rectus femoris muscle. Patient has a history dating back several years of elevated CK levels, muscle atrophy, muscle weakness, and low back and leg pain. He has undergone evaluation by physiatry, neurosurgery, rheumatology, and neurology. He has had multiple studies including MRI scan and EMG. He is now referred for muscle biopsy as part of his evaluation. Patient has not had any previous such procedure. He is not on any anticoagulation. He does not have any bleeding disorders.   Past Surgical History No pertinent past surgical history   Diagnostic Studies History Colonoscopy  never  Allergies No Known Drug Allergies [02/03/2018]: Allergies Reconciled   Medication History Buprenorphine HCl-Naloxone HCl (8-2MG  Tab Sublingual, Sublingual) Active. Testosterone Cypionate (200MG /ML Solution, Intramuscular) Active. AmLODIPine Besylate (5MG  Tablet, Oral) Active. HydroCHLOROthiazide (25MG  Tablet, Oral) Active. Medications Reconciled  Social History Alcohol use  Occasional alcohol use. No caffeine use  No drug use  Tobacco use  Never smoker.  Family History First Degree Relatives  No pertinent family history   Other Problems Back Pain  High blood pressure   Review of Systems General Present- Fatigue and Night Sweats. Not Present- Appetite Loss, Chills, Fever, Weight Gain and Weight Loss. Skin Not Present- Change in Wart/Mole, Dryness, Hives, Jaundice, New Lesions, Non-Healing Wounds, Rash and Ulcer. HEENT Present- Ringing in the Ears. Not  Present- Earache, Hearing Loss, Hoarseness, Nose Bleed, Oral Ulcers, Seasonal Allergies, Sinus Pain, Sore Throat, Visual Disturbances, Wears glasses/contact lenses and Yellow Eyes. Respiratory Present- Snoring. Not Present- Bloody sputum, Chronic Cough, Difficulty Breathing and Wheezing. Breast Not Present- Breast Mass, Breast Pain, Nipple Discharge and Skin Changes. Gastrointestinal Not Present- Abdominal Pain, Bloating, Bloody Stool, Change in Bowel Habits, Chronic diarrhea, Constipation, Difficulty Swallowing, Excessive gas, Gets full quickly at meals, Hemorrhoids, Indigestion, Nausea, Rectal Pain and Vomiting. Male Genitourinary Not Present- Blood in Urine, Change in Urinary Stream, Frequency, Impotence, Nocturia, Painful Urination, Urgency and Urine Leakage. Musculoskeletal Present- Back Pain, Joint Pain, Joint Stiffness, Muscle Pain and Muscle Weakness. Not Present- Swelling of Extremities. Neurological Present- Headaches and Weakness. Not Present- Decreased Memory, Fainting, Numbness, Seizures, Tingling, Tremor and Trouble walking. Psychiatric Present- Anxiety. Not Present- Bipolar, Change in Sleep Pattern, Depression, Fearful and Frequent crying. Endocrine Present- Hot flashes. Not Present- Cold Intolerance, Excessive Hunger, Hair Changes, Heat Intolerance and New Diabetes. Hematology Not Present- Blood Thinners, Easy Bruising, Excessive bleeding, Gland problems, HIV and Persistent Infections.  Vitals Weight: 209.6 lb Height: 71in Body Surface Area: 2.15 m Body Mass Index: 29.23 kg/m  Temp.: 98.70F  Pulse: 93 (Regular)  BP: 136/92 (Sitting, Left Arm, Standard)  Physical Exam  See vital signs recorded above  GENERAL APPEARANCE Development: normal Nutritional status: normal Gross deformities: none  SKIN Rash, lesions, ulcers: none Induration, erythema: none Nodules: none palpable  EYES Conjunctiva and lids: normal Pupils: equal and reactive Iris: normal  bilaterally  EARS, NOSE, MOUTH, THROAT External ears: no lesion or deformity External nose: no lesion or deformity Hearing: grossly normal Lips: no lesion or deformity Dentition: normal for age Oral mucosa: moist  NECK Symmetric: yes Trachea: midline Thyroid: no palpable nodules in the  thyroid bed  CHEST Respiratory effort: normal Retraction or accessory muscle use: no Breath sounds: normal bilaterally Rales, rhonchi, wheeze: none  CARDIOVASCULAR Auscultation: regular rhythm, normal rate Murmurs: none Pulses: carotid and radial pulse 2+ palpable Lower extremity edema: none Lower extremity varicosities: none  MUSCULOSKELETAL Station and gait: normal Digits and nails: no clubbing or cyanosis Muscle strength: grossly normal all extremities Range of motion: grossly normal all extremities Deformity: none  LYMPHATIC Cervical: none palpable Supraclavicular: none palpable  PSYCHIATRIC Oriented to person, place, and time: yes Mood and affect: normal for situation Judgment and insight: appropriate for situation    Assessment & Plan  MUSCLE ATROPHY (M62.50) MUSCLE WEAKNESS (GENERALIZED) (M62.81)  Patient presents today on referral from his neurologist, Dr. Nita Sickle, for evaluation and scheduling of muscle biopsy. The referral states that the right deltoid muscle and right rectus femoris muscle should be biopsied. However, the patient believes it is the left side that is most symptomatic and should have the biopsy. We will make sure to clarify this before his procedure.  I have recommended proceeding with muscle biopsy as an outpatient surgical procedure under anesthesia. We will arrange for this to be done at the [facility] outpatient surgical center since his wife is an employee of the health system. We discussed the procedure, the location of the surgical incisions, and the postoperative care and recovery. He understands and wishes to proceed in the near future.  We will have to make arrangements for pathology for handling of the specimen. This usually takes 7-10 days to obtain the results.  The risks and benefits of the procedure have been discussed at length with the patient. The patient understands the proposed procedure, potential alternative treatments, and the course of recovery to be expected. All of the patient's questions have been answered at this time. The patient wishes to proceed with surgery.   Darnell Level, MD [facility] Surgery Office: [phone]

## 2018-02-03 NOTE — Telephone Encounter (Signed)
[facility] [address] For his MRI sch today at 2:15pm. He is also scheduled for right deltoid and rectus biopsy this Friday, pt is under the impression that it is supposed to be the left side not the right one so needs some clarification.

## 2018-02-03 NOTE — Telephone Encounter (Signed)
I spoke with patient and explained to him that we do the opposite side that the EMG was done on since the needle can cause some muscle injury and may give us false results on the biopsy.    Rx sent to pharmacy.

## 2018-02-04 ENCOUNTER — Encounter (HOSPITAL_BASED_OUTPATIENT_CLINIC_OR_DEPARTMENT_OTHER): Payer: Self-pay | Admitting: *Deleted

## 2018-02-04 ENCOUNTER — Other Ambulatory Visit: Payer: Self-pay

## 2018-02-04 NOTE — Pre-Procedure Instructions (Signed)
To come for BMET and EKG 

## 2018-02-05 ENCOUNTER — Other Ambulatory Visit: Payer: Self-pay | Admitting: Physician Assistant

## 2018-02-05 MED FILL — BUPRENORPHIN-NALOXON 8-2 MG: 8-2 | 15 days supply | Qty: 30 | Fill #0

## 2018-02-05 NOTE — Telephone Encounter (Signed)
Patient has an appointment on 02/06/2018 with Deliah BostonMichael Clark.  He will receive refills then.

## 2018-02-06 ENCOUNTER — Encounter (HOSPITAL_BASED_OUTPATIENT_CLINIC_OR_DEPARTMENT_OTHER)
Admission: RE | Admit: 2018-02-06 | Discharge: 2018-02-06 | Disposition: A | Discharge status: Home / Self Care | Payer: 59 | Source: Ambulatory Visit | Attending: Surgery | Admitting: Surgery

## 2018-02-06 ENCOUNTER — Ambulatory Visit: Payer: 59 | Admitting: Family Medicine

## 2018-02-06 DIAGNOSIS — Z885 Allergy status to narcotic agent status: Secondary | ICD-10-CM | POA: Diagnosis not present

## 2018-02-06 DIAGNOSIS — Z79899 Other long term (current) drug therapy: Secondary | ICD-10-CM | POA: Diagnosis not present

## 2018-02-06 DIAGNOSIS — M6281 Muscle weakness (generalized): Secondary | ICD-10-CM | POA: Diagnosis not present

## 2018-02-06 DIAGNOSIS — M6258 Muscle wasting and atrophy, not elsewhere classified, other site: Secondary | ICD-10-CM | POA: Diagnosis not present

## 2018-02-06 DIAGNOSIS — I10 Essential (primary) hypertension: Secondary | ICD-10-CM | POA: Diagnosis not present

## 2018-02-06 DIAGNOSIS — M1990 Unspecified osteoarthritis, unspecified site: Secondary | ICD-10-CM | POA: Diagnosis not present

## 2018-02-06 LAB — BASIC METABOLIC PANEL
ANION GAP: 10 (ref 5–15)
BUN: 10 mg/dL (ref 6–20)
CHLORIDE: 105 mmol/L (ref 98–111)
CO2: 28 mmol/L (ref 22–32)
Calcium: 9.8 mg/dL (ref 8.9–10.3)
Creatinine, Ser: 0.72 mg/dL (ref 0.61–1.24)
GFR calc Af Amer: >60 mL/min (ref >60)
GLUCOSE: 87 mg/dL (ref 70–99)
POTASSIUM: 3.7 mmol/L (ref 3.5–5.1)
Sodium: 143 mmol/L (ref 135–145)

## 2018-02-06 MED FILL — AMLODIPINE BESYLATE 5 MG TA: 5 | 30 days supply | Qty: 30 | Fill #0

## 2018-02-06 NOTE — Progress Notes (Deleted)
No chief complaint on file.   HPI  4 review of systems  Past Medical History:  Diagnosis Date  . Degenerative disc disease, lumbar   . Dental crown present   . History of kidney stones   . Hypertension    states under control with meds., has been on med. x 1 yr. - is currently out of med., to see PCP 02/06/2018  . Muscle atrophy 01/2018  . Muscle weakness 01/2018    Current Outpatient Medications  Medication Sig Dispense Refill  . acetaminophen (TYLENOL) 325 MG tablet Take 2,000 mg by mouth every 6 (six) hours as needed.    Marland Kitchen amLODipine (NORVASC) 5 MG tablet Take 0.5-1.5 tablets (2.5-7.5 mg total) by mouth daily. Per ambulatory monitoring. 90 tablet 0  . buprenorphine-naloxone (SUBOXONE) 8-2 mg SUBL SL tablet Place 0.5 tablets under the tongue 2 (two) times daily.    . hydrochlorothiazide (HYDRODIURIL) 25 MG tablet Take 25 mg by mouth daily.    Marland Kitchen ibuprofen (ADVIL,MOTRIN) 800 MG tablet Take 1 tablet (800 mg total) by mouth 3 (three) times daily. 21 tablet 0  . testosterone cypionate (DEPO-TESTOSTERONE) 200 MG/ML injection Inject 400 mg into the muscle every 14 (fourteen) days.      No current facility-administered medications for this visit.     Allergies:  Allergies  Allergen Reactions  . Vicodin [Hydrocodone-Acetaminophen] Nausea And Vomiting    Past Surgical History:  Procedure Laterality Date  . ENDOVENOUS ABLATION SAPHENOUS VEIN W/ LASER Right 12-29-2015   endovenous laser ablation right greater saphenous vein, stab phlebectomy > 20 incisions right leg, sclerotherapy right leg by Gretta Began MD    . NASAL FRACTURE SURGERY      Social History   Socioeconomic History  . Marital status: Married    Spouse name: Not on file  . Number of children: 1  . Years of education: 34  . Highest education level: Some college, no degree  Occupational History  . 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 Financial resource strain: Not on file  . Food insecurity:    Worry: Not  on file    Inability: Not on file  . Transportation needs:    Medical: Not on file    Non-medical: Not on file  Tobacco Use  . Smoking status: Never Smoker  . Smokeless tobacco: Former Engineer, water and Sexual Activity  . Alcohol use: Yes    Comment: occasionally  . Drug use: No  . Sexual activity: Yes  Lifestyle  . Physical activity:    Days per week: Not on file    Minutes per session: Not on file  . Stress: Not on file  Relationships  . Social connections:    Talks on phone: Not on file    Gets together: Not on file    Attends religious service: Not on file    Active member of club or organization: Not on file    Attends meetings of clubs or organizations: Not on file    Relationship status: Not on file  Other Topics Concern  . Not on file  Social History Narrative   Lives with wife and daughter in a one story home.  Has one child.  Currently not working.  Education: some college.     Family History  Problem Relation Age of Onset  . Varicose Veins Brother      ROS Review of Systems See HPI Constitution: No fevers or chills No malaise No diaphoresis Skin: No rash or itching  Eyes: no blurry vision, no double vision GU: no dysuria or hematuria Neuro: no dizziness or headaches * all others reviewed and negative   Objective: There were no vitals filed for this visit.  Physical Exam  Assessment and Plan There are no diagnoses linked to this encounter.   [place] Cilento P P[employer] Corporationaddy

## 2018-02-06 NOTE — Progress Notes (Signed)
EKG reviewed with Dr.Miller. Will proceed with surgery as scheduled.   

## 2018-02-08 ENCOUNTER — Other Ambulatory Visit: Payer: 59

## 2018-02-11 ENCOUNTER — Ambulatory Visit (HOSPITAL_BASED_OUTPATIENT_CLINIC_OR_DEPARTMENT_OTHER)
Admission: RE | Admit: 2018-02-11 | Discharge: 2018-02-11 | Disposition: A | Discharge status: Home / Self Care | Payer: 59 | Source: Ambulatory Visit | Attending: Surgery | Admitting: Surgery

## 2018-02-11 ENCOUNTER — Encounter (HOSPITAL_BASED_OUTPATIENT_CLINIC_OR_DEPARTMENT_OTHER)
Admission: RE | Disposition: A | Discharge status: Home / Self Care | Payer: Self-pay | Source: Ambulatory Visit | Attending: Surgery

## 2018-02-11 ENCOUNTER — Other Ambulatory Visit: Payer: Self-pay

## 2018-02-11 ENCOUNTER — Ambulatory Visit (HOSPITAL_BASED_OUTPATIENT_CLINIC_OR_DEPARTMENT_OTHER): Payer: 59 | Admitting: Certified Registered"

## 2018-02-11 ENCOUNTER — Encounter (HOSPITAL_BASED_OUTPATIENT_CLINIC_OR_DEPARTMENT_OTHER): Payer: Self-pay | Admitting: Anesthesiology

## 2018-02-11 DIAGNOSIS — M6281 Muscle weakness (generalized): Secondary | ICD-10-CM | POA: Diagnosis not present

## 2018-02-11 DIAGNOSIS — M6258 Muscle wasting and atrophy, not elsewhere classified, other site: Secondary | ICD-10-CM | POA: Insufficient documentation

## 2018-02-11 DIAGNOSIS — Z79899 Other long term (current) drug therapy: Secondary | ICD-10-CM | POA: Insufficient documentation

## 2018-02-11 DIAGNOSIS — Z885 Allergy status to narcotic agent status: Secondary | ICD-10-CM | POA: Insufficient documentation

## 2018-02-11 DIAGNOSIS — M1990 Unspecified osteoarthritis, unspecified site: Secondary | ICD-10-CM | POA: Insufficient documentation

## 2018-02-11 DIAGNOSIS — G7289 Other specified myopathies: Secondary | ICD-10-CM | POA: Diagnosis present

## 2018-02-11 DIAGNOSIS — I10 Essential (primary) hypertension: Secondary | ICD-10-CM | POA: Diagnosis not present

## 2018-02-11 DIAGNOSIS — R748 Abnormal levels of other serum enzymes: Secondary | ICD-10-CM | POA: Diagnosis present

## 2018-02-11 HISTORY — PX: MINOR MUSCLE BIOPSY: SHX6352

## 2018-02-11 HISTORY — DX: Essential (primary) hypertension: I10

## 2018-02-11 HISTORY — DX: Dental restoration status: Z98.811

## 2018-02-11 HISTORY — DX: Muscle wasting and atrophy, not elsewhere classified, unspecified site: M62.50

## 2018-02-11 HISTORY — DX: Other intervertebral disc degeneration, lumbar region without mention of lumbar back pain or lower extremity pain: M51.369

## 2018-02-11 HISTORY — DX: Personal history of urinary calculi: Z87.442

## 2018-02-11 HISTORY — DX: Other intervertebral disc degeneration, lumbar region: M51.36

## 2018-02-11 HISTORY — DX: Muscle weakness (generalized): M62.81

## 2018-02-11 HISTORY — PX: MUSCLE BIOPSY: SHX716

## 2018-02-11 SURGERY — MUSCLE BIOPSY
Anesthesia: General | Site: Shoulder | Laterality: Right

## 2018-02-11 MED ORDER — SCOPOLAMINE 1 MG/3DAYS TD PT72: 1.0000 | MEDICATED_PATCH | Freq: Once | TRANSDERMAL | Status: DC | PRN

## 2018-02-11 MED ORDER — LIDOCAINE 2% (20 MG/ML) 5 ML SYRINGE
INTRAMUSCULAR | Status: DC | PRN
  Administered 2018-02-11: 100 mg via INTRAVENOUS

## 2018-02-11 MED ORDER — BUPIVACAINE-EPINEPHRINE (PF) 0.5% -1:200000 IJ SOLN
INTRAMUSCULAR | Status: AC
  Filled 2018-02-11: qty 30

## 2018-02-11 MED ORDER — MIDAZOLAM HCL 2 MG/2ML IJ SOLN
INTRAMUSCULAR | Status: AC
Start: 2018-02-11 — End: ?
  Filled 2018-02-11: qty 2

## 2018-02-11 MED ORDER — HYDROMORPHONE HCL 1 MG/ML IJ SOLN
INTRAMUSCULAR | Status: AC
  Filled 2018-02-11: qty 0.5

## 2018-02-11 MED ORDER — PHENYLEPHRINE 40 MCG/ML (10ML) SYRINGE FOR IV PUSH (FOR BLOOD PRESSURE SUPPORT)
PREFILLED_SYRINGE | INTRAVENOUS | Status: AC
  Filled 2018-02-11: qty 10

## 2018-02-11 MED ORDER — CHLORHEXIDINE GLUCONATE CLOTH 2 % EX PADS: 6.0000 | MEDICATED_PAD | Freq: Once | CUTANEOUS | Status: DC

## 2018-02-11 MED ORDER — PROMETHAZINE HCL 25 MG/ML IJ SOLN: 6.2500 mg | INTRAMUSCULAR | Status: DC | PRN

## 2018-02-11 MED ORDER — MIDAZOLAM HCL 2 MG/2ML IJ SOLN
1.0000 mg | INTRAMUSCULAR | Status: DC | PRN
  Administered 2018-02-11: 2 mg via INTRAVENOUS

## 2018-02-11 MED ORDER — LIDOCAINE 2% (20 MG/ML) 5 ML SYRINGE
INTRAMUSCULAR | Status: AC
  Filled 2018-02-11: qty 5

## 2018-02-11 MED ORDER — LIDOCAINE HCL (PF) 1 % IJ SOLN
INTRAMUSCULAR | Status: AC
  Filled 2018-02-11: qty 30

## 2018-02-11 MED ORDER — CEFAZOLIN SODIUM-DEXTROSE 2-4 GM/100ML-% IV SOLN
INTRAVENOUS | Status: AC
  Filled 2018-02-11: qty 100

## 2018-02-11 MED ORDER — BUPIVACAINE-EPINEPHRINE 0.5% -1:200000 IJ SOLN
INTRAMUSCULAR | Status: DC | PRN
  Administered 2018-02-11: 27 mL

## 2018-02-11 MED ORDER — HYDROMORPHONE HCL 1 MG/ML IJ SOLN
0.2500 mg | INTRAMUSCULAR | Status: DC | PRN
  Administered 2018-02-11 (×2): 0.5 mg via INTRAVENOUS

## 2018-02-11 MED ORDER — LACTATED RINGERS IV SOLN
INTRAVENOUS | Status: DC
  Administered 2018-02-11 (×3): via INTRAVENOUS

## 2018-02-11 MED ORDER — PROPOFOL 10 MG/ML IV BOLUS
INTRAVENOUS | Status: DC | PRN
  Administered 2018-02-11: 200 mg via INTRAVENOUS

## 2018-02-11 MED ORDER — FENTANYL CITRATE (PF) 100 MCG/2ML IJ SOLN: 25.0000 ug | INTRAMUSCULAR | Status: DC | PRN

## 2018-02-11 MED ORDER — IBUPROFEN 800 MG PO TABS: 800.0000 mg | ORAL_TABLET | Freq: Four times a day (QID) | ORAL | 0 refills | Status: DC | PRN

## 2018-02-11 MED ORDER — ONDANSETRON HCL 4 MG/2ML IJ SOLN
INTRAMUSCULAR | Status: DC | PRN
  Administered 2018-02-11: 4 mg via INTRAVENOUS

## 2018-02-11 MED ORDER — PHENYLEPHRINE 40 MCG/ML (10ML) SYRINGE FOR IV PUSH (FOR BLOOD PRESSURE SUPPORT)
PREFILLED_SYRINGE | INTRAVENOUS | Status: DC | PRN
  Administered 2018-02-11 (×3): 80 ug via INTRAVENOUS

## 2018-02-11 MED ORDER — TRAMADOL HCL 50 MG PO TABS: 50.0000 mg | ORAL_TABLET | Freq: Four times a day (QID) | ORAL | 0 refills | Status: DC | PRN

## 2018-02-11 MED ORDER — EPHEDRINE 5 MG/ML INJ
INTRAVENOUS | Status: AC
  Filled 2018-02-11: qty 10

## 2018-02-11 MED ORDER — DEXAMETHASONE SODIUM PHOSPHATE 4 MG/ML IJ SOLN
INTRAMUSCULAR | Status: DC | PRN
  Administered 2018-02-11: 10 mg via INTRAVENOUS

## 2018-02-11 MED ORDER — PROPOFOL 500 MG/50ML IV EMUL
INTRAVENOUS | Status: AC
  Filled 2018-02-11: qty 150

## 2018-02-11 MED ORDER — EPHEDRINE SULFATE-NACL 50-0.9 MG/10ML-% IV SOSY
PREFILLED_SYRINGE | INTRAVENOUS | Status: DC | PRN
  Administered 2018-02-11: 15 mg via INTRAVENOUS
  Administered 2018-02-11 (×2): 10 mg via INTRAVENOUS

## 2018-02-11 MED ORDER — FENTANYL CITRATE (PF) 100 MCG/2ML IJ SOLN
50.0000 ug | INTRAMUSCULAR | Status: DC | PRN
  Administered 2018-02-11: 50 ug via INTRAVENOUS

## 2018-02-11 MED ORDER — FENTANYL CITRATE (PF) 100 MCG/2ML IJ SOLN
INTRAMUSCULAR | Status: AC
  Filled 2018-02-11: qty 2

## 2018-02-11 MED ORDER — VASOPRESSIN 20 UNIT/ML IV SOLN
INTRAVENOUS | Status: DC | PRN
  Administered 2018-02-11 (×6): 1 m[IU] via INTRAVENOUS

## 2018-02-11 MED ORDER — DEXAMETHASONE SODIUM PHOSPHATE 10 MG/ML IJ SOLN
INTRAMUSCULAR | Status: AC
  Filled 2018-02-11: qty 1

## 2018-02-11 MED ORDER — CEFAZOLIN SODIUM-DEXTROSE 2-4 GM/100ML-% IV SOLN
2.0000 g | INTRAVENOUS | Status: AC
  Administered 2018-02-11: 2 g via INTRAVENOUS

## 2018-02-11 MED ORDER — ONDANSETRON HCL 4 MG/2ML IJ SOLN
INTRAMUSCULAR | Status: AC
  Filled 2018-02-11: qty 2

## 2018-02-11 MED FILL — IBUPROFEN 800 MG TAB: 800 | 8 days supply | Qty: 30 | Fill #0

## 2018-02-11 SURGICAL SUPPLY — 52 items
ADH SKN CLS APL DERMABOND .7 (GAUZE/BANDAGES/DRESSINGS) ×4
APL SKNCLS STERI-STRIP NONHPOA (GAUZE/BANDAGES/DRESSINGS)
BENZOIN TINCTURE PRP APPL 2/3 (GAUZE/BANDAGES/DRESSINGS) IMPLANT
BLADE CLIPPER SURG (BLADE) ×2 IMPLANT
BLADE SURG 15 STRL LF DISP TIS (BLADE) ×2 IMPLANT
BLADE SURG 15 STRL SS (BLADE) ×4
BNDG COHESIVE 4X5 TAN STRL (GAUZE/BANDAGES/DRESSINGS) IMPLANT
BNDG GAUZE ELAST 4 BULKY (GAUZE/BANDAGES/DRESSINGS) IMPLANT
CHLORAPREP W/TINT 26ML (MISCELLANEOUS) ×6 IMPLANT
CLEANER CAUTERY TIP 5X5 PAD (MISCELLANEOUS) IMPLANT
CLOSURE WOUND 1/2 X4 (GAUZE/BANDAGES/DRESSINGS)
COVER BACK TABLE 60X90IN (DRAPES) ×4 IMPLANT
COVER MAYO STAND STRL (DRAPES) ×4 IMPLANT
DECANTER SPIKE VIAL GLASS SM (MISCELLANEOUS) IMPLANT
DERMABOND ADVANCED (GAUZE/BANDAGES/DRESSINGS) ×4
DERMABOND ADVANCED .7 DNX12 (GAUZE/BANDAGES/DRESSINGS) IMPLANT
DRAPE EXTREMITY T 121X128X90 (DRAPE) IMPLANT
DRAPE LAPAROTOMY 100X72 PEDS (DRAPES) ×4 IMPLANT
DRAPE U-SHAPE 76X120 STRL (DRAPES) IMPLANT
DRAPE UTILITY XL STRL (DRAPES) ×6 IMPLANT
DRSG TEGADERM 4X4.75 (GAUZE/BANDAGES/DRESSINGS) IMPLANT
ELECT REM PT RETURN 9FT ADLT (ELECTROSURGICAL) ×4
ELECTRODE REM PT RTRN 9FT ADLT (ELECTROSURGICAL) ×2 IMPLANT
GAUZE SPONGE 4X4 12PLY STRL LF (GAUZE/BANDAGES/DRESSINGS) ×4 IMPLANT
GLOVE BIO SURGEON STRL SZ7 (GLOVE) ×2 IMPLANT
GLOVE BIOGEL PI IND STRL 7.0 (GLOVE) IMPLANT
GLOVE BIOGEL PI INDICATOR 7.0 (GLOVE) ×2
GLOVE SURG ORTHO 8.0 STRL STRW (GLOVE) ×4 IMPLANT
GOWN STRL REUS W/ TWL LRG LVL3 (GOWN DISPOSABLE) ×2 IMPLANT
GOWN STRL REUS W/ TWL XL LVL3 (GOWN DISPOSABLE) ×2 IMPLANT
GOWN STRL REUS W/TWL LRG LVL3 (GOWN DISPOSABLE)
GOWN STRL REUS W/TWL XL LVL3 (GOWN DISPOSABLE) ×8
NDL HYPO 25X1 1.5 SAFETY (NEEDLE) ×2 IMPLANT
NEEDLE HYPO 25X1 1.5 SAFETY (NEEDLE) ×4 IMPLANT
PACK BASIN DAY SURGERY FS (CUSTOM PROCEDURE TRAY) ×4 IMPLANT
PAD CLEANER CAUTERY TIP 5X5 (MISCELLANEOUS)
PENCIL BUTTON HOLSTER BLD 10FT (ELECTRODE) ×4 IMPLANT
SHEET MEDIUM DRAPE 40X70 STRL (DRAPES) IMPLANT
SLEEVE SCD COMPRESS KNEE MED (MISCELLANEOUS) ×2 IMPLANT
STAPLER VISISTAT 35W (STAPLE) ×2 IMPLANT
STOCKINETTE 4X48 STRL (DRAPES) IMPLANT
STRIP CLOSURE SKIN 1/2X4 (GAUZE/BANDAGES/DRESSINGS) IMPLANT
SUT ETHILON 3 0 PS 1 (SUTURE) IMPLANT
SUT MNCRL AB 4-0 PS2 18 (SUTURE) ×4 IMPLANT
SUT VIC AB 2-0 SH 27 (SUTURE)
SUT VIC AB 2-0 SH 27XBRD (SUTURE) IMPLANT
SUT VIC AB 3-0 54X BRD REEL (SUTURE) IMPLANT
SUT VIC AB 3-0 BRD 54 (SUTURE) ×4
SUT VICRYL 3-0 CR8 SH (SUTURE) ×4 IMPLANT
SYR CONTROL 10ML LL (SYRINGE) ×4 IMPLANT
TOWEL GREEN STERILE FF (TOWEL DISPOSABLE) ×6 IMPLANT
TOWEL OR NON WOVEN STRL DISP B (DISPOSABLE) ×4 IMPLANT

## 2018-02-11 NOTE — Op Note (Signed)
Procedure Note  Pre-operative Diagnosis:  Muscle weakness, elevated CK level  Post-operative Diagnosis:  same  Surgeon:  Joseph Levelodd Fontaine Hehl, MD  Assistant:  none   Procedure:  1. Right deltoid muscle biopsy  2. Right rectus femoris muscle biopsy  Anesthesia:  general  Estimated Blood Loss:  minimal  Drains: none         Specimen: muscle from deltoid, muscle from rectus femoris - to pathology fresh for processing  Indications:  Patient is referred by Dr. Nita Sickleonika Patel from neurology for surgical evaluation for muscle biopsy of the deltoid muscle and rectus femoris muscle. Patient has a history dating back several years of elevated CK levels, muscle atrophy, muscle weakness, and low back and leg pain. He has undergone evaluation by physiatry, neurosurgery, rheumatology, and neurology. He has had multiple studies including MRI scan and EMG. He is now referred for muscle biopsy as part of his evaluation.   Procedure Details:  The patient was seen in the pre-op holding area. The risks, benefits, complications, treatment options, and expected outcomes were previously discussed with the patient. The patient agreed with the proposed plan and has signed the informed consent form.  The patient was brought to the Operating Room, identified as Joseph Ayala and the procedure verified. A "time out" was completed and the above information confirmed.  Incision is made over the right deltoid muscle with a #10 blade.  Dissection is carried through subcutaneous tissues and subcutaneous flaps are elevated.  Fascia is incised sharply.  A muscle bundle measuring 2.5 cm in length and 6 mm in diameter is isolated on hemostats and ligated proximally and distally with 3-0 Vicryl ties.  Muscle was then excised sharply with the Metzenbaum scissors.  It is placed in a saline moistened gauze sponge and submitted fresh to pathology.  Good hemostasis is achieved.  Local anesthetic with epinephrine is infiltrated.  Fascia is  closed with interrupted 3-0 Vicryl sutures.  Subcutaneous tissues are closed with interrupted 3-0 Vicryl sutures.  Skin is closed with a running 4-0 Monocryl subcuticular suture.  Dermabond is placed as dressing.  Incision is made over the right anterior thigh over the rectus for Morris muscle.  Dissection is carried through subcutaneous tissues.  Skin flaps were developed circumferentially and a Weitlander retractor placed for exposure.  Fascia is incised.  Again a muscle bundle is isolated measuring approximately 2.5 cm in length and 7 mm in diameter.  This is ligated proximally and distally with 3-0 Vicryl ties.  Muscle was excised sharply using Metzenbaum scissors.  Specimen is placed in a saline moistened gauze sponge and submitted fresh to pathology.  Local anesthetic with epinephrine is infiltrated throughout the wound.  Fascia was closed with interrupted 3-0 Vicryl sutures.  Subcutaneous tissues are closed with interrupted 3-0 Vicryl sutures.  Skin is closed with a running 4-0 Monocryl subcuticular suture.  Dermabond is applied as dressing.  Patient is awakened from anesthesia and brought to the recovery room.  The patient tolerated the procedure well.  Joseph Levelodd Manfred Laspina, MD [facility] Office: [phone]

## 2018-02-11 NOTE — Anesthesia Procedure Notes (Signed)
Procedure Name: LMA Insertion Date/Time: 02/11/2018 8:36 AM Performed by: Caren Macadamarter, Cyndra Feinberg W, CRNA Pre-anesthesia Checklist: Patient identified, Emergency Drugs available, Suction available and Patient being monitored Patient Re-evaluated:Patient Re-evaluated prior to induction Oxygen Delivery Method: Circle system utilized Preoxygenation: Pre-oxygenation with 100% oxygen Induction Type: IV induction Ventilation: Mask ventilation without difficulty LMA: LMA inserted LMA Size: 5.0 Number of attempts: 1 Airway Equipment and Method: Bite block Placement Confirmation: positive ETCO2 Tube secured with: Tape Dental Injury: Teeth and Oropharynx as per pre-operative assessment

## 2018-02-11 NOTE — Interval H&P Note (Signed)
History and Physical Interval Note:  02/11/2018 8:19 AM  Joseph Ayala  has presented today for surgery, with the diagnosis of Muscle weakness and atrophy  The various methods of treatment have been discussed with the patient and family. After consideration of risks, benefits and other options for treatment, the patient has consented to    Procedure(s): DELTOID MUSCLE BIOPSY (N/A) RECTUS FEMORIS MUSCLE BIOPSY (N/A) as a surgical intervention .    The patient's history has been reviewed, patient examined, no change in status, stable for surgery.  I have reviewed the patient's chart and labs.  Questions were answered to the patient's satisfaction.    Darnell Levelodd Trenia Tennyson, MD [facility] Office: [phone]    Damen Windsor MJudie Petit

## 2018-02-11 NOTE — Anesthesia Postprocedure Evaluation (Signed)
Anesthesia Post Note  Patient: Joseph Ayala  Procedure(s) Performed: DELTOID MUSCLE BIOPSY (Right Shoulder) RECTUS FEMORIS MUSCLE BIOPSY (Right Leg Upper)     Patient location during evaluation: PACU Anesthesia Type: General Level of consciousness: awake and alert Pain management: pain level controlled Vital Signs Assessment: post-procedure vital signs reviewed and stable Respiratory status: spontaneous breathing, nonlabored ventilation and respiratory function stable Cardiovascular status: blood pressure returned to baseline and stable Postop Assessment: no apparent nausea or vomiting Anesthetic complications: no    Last Vitals:  Vitals:   02/11/18 1100 02/11/18 1130  BP: (!) 123/97 123/87  Pulse: 97 89  Resp: 16 16  Temp:  36.7 C  SpO2: 95% 95%    Last Pain:  Vitals:   02/11/18 1130  TempSrc:   PainSc: 4                  Cecile HearingStephen Edward Kimberleigh Mehan

## 2018-02-11 NOTE — Transfer of Care (Signed)
Immediate Anesthesia Transfer of Care Note  Patient: Joseph Ayala  Procedure(s) Performed: DELTOID MUSCLE BIOPSY (Right Shoulder) RECTUS FEMORIS MUSCLE BIOPSY (Right Leg Upper)  Patient Location: PACU  Anesthesia Type:General  Level of Consciousness: awake  Airway & Oxygen Therapy: Patient Spontanous Breathing and Patient connected to face mask oxygen  Post-op Assessment: Report given to RN and Post -op Vital signs reviewed and stable  Post vital signs: Reviewed and stable  Last Vitals:  Vitals Value Taken Time  BP 128/96 02/11/2018  9:51 AM  Temp 37 C 02/11/2018  9:52 AM  Pulse 94 02/11/2018  9:56 AM  Resp 7 02/11/2018  9:56 AM  SpO2 100 % 02/11/2018  9:56 AM  Vitals shown include unvalidated device data.  Last Pain:  Vitals:   02/11/18 0751  TempSrc: Oral  PainSc: 3          Complications: No apparent anesthesia complications

## 2018-02-11 NOTE — Anesthesia Preprocedure Evaluation (Signed)
Anesthesia Evaluation  Patient identified by MRN, date of birth, ID band Patient awake    Reviewed: Allergy & Precautions, NPO status , Patient's Chart, lab work & pertinent test results  Airway Mallampati: II  TM Distance: >3 FB Neck ROM: Full    Dental  (+) Teeth Intact, Dental Advisory Given   Pulmonary neg pulmonary ROS,    Pulmonary exam normal breath sounds clear to auscultation       Cardiovascular hypertension, Pt. on medications Normal cardiovascular exam Rhythm:Regular Rate:Normal     Neuro/Psych negative neurological ROS  negative psych ROS   GI/Hepatic negative GI ROS, Neg liver ROS,   Endo/Other  negative endocrine ROS  Renal/GU negative Renal ROS     Musculoskeletal  (+) Arthritis , Muscle atrophy    Abdominal   Peds  Hematology negative hematology ROS (+)   Anesthesia Other Findings Day of surgery medications reviewed with the patient.  Reproductive/Obstetrics                             Anesthesia Physical Anesthesia Plan  ASA: II  Anesthesia Plan: General   Post-op Pain Management:    Induction: Intravenous  PONV Risk Score and Plan: 2 and Ondansetron, Dexamethasone and Midazolam  Airway Management Planned: Oral ETT  Additional Equipment:   Intra-op Plan:   Post-operative Plan: Extubation in OR  Informed Consent: I have reviewed the patients History and Physical, chart, labs and discussed the procedure including the risks, benefits and alternatives for the proposed anesthesia with the patient or authorized representative who has indicated his/her understanding and acceptance.   Dental advisory given  Plan Discussed with: CRNA  Anesthesia Plan Comments:         Anesthesia Quick Evaluation

## 2018-02-11 NOTE — Discharge Instructions (Signed)

## 2018-02-12 ENCOUNTER — Encounter (HOSPITAL_BASED_OUTPATIENT_CLINIC_OR_DEPARTMENT_OTHER): Payer: Self-pay | Admitting: Surgery

## 2018-02-13 ENCOUNTER — Ambulatory Visit: Payer: 59 | Admitting: Family Medicine

## 2018-02-13 NOTE — Progress Notes (Deleted)
No chief complaint on file.   HPI  4 review of systems  Past Medical History:  Diagnosis Date  . Degenerative disc disease, lumbar   . Dental crown present   . History of kidney stones   . Hypertension    states under control with meds., has been on med. x 1 yr. - is currently out of med., to see PCP 02/06/2018  . Muscle atrophy 01/2018  . Muscle weakness 01/2018    Current Outpatient Medications  Medication Sig Dispense Refill  . acetaminophen (TYLENOL) 325 MG tablet Take 2,000 mg by mouth every 6 (six) hours as needed.    Marland Kitchen. amLODipine (NORVASC) 5 MG tablet TAKE 1 TABLET BY MOUTH ONCE DAILY 30 tablet 0  . buprenorphine-naloxone (SUBOXONE) 8-2 mg SUBL SL tablet Place 0.5 tablets under the tongue 2 (two) times daily.    . hydrochlorothiazide (HYDRODIURIL) 25 MG tablet Take 25 mg by mouth daily.    Marland Kitchen. ibuprofen (ADVIL,MOTRIN) 800 MG tablet Take 1 tablet (800 mg total) by mouth 3 (three) times daily. 21 tablet 0  . ibuprofen (ADVIL,MOTRIN) 800 MG tablet Take 1 tablet (800 mg total) by mouth every 6 (six) hours as needed for mild pain or moderate pain. 30 tablet 0  . testosterone cypionate (DEPO-TESTOSTERONE) 200 MG/ML injection Inject 400 mg into the muscle every 14 (fourteen) days.     . traMADol (ULTRAM) 50 MG tablet Take 1-2 tablets (50-100 mg total) by mouth every 6 (six) hours as needed for moderate pain. 15 tablet 0   No current facility-administered medications for this visit.     Allergies:  Allergies  Allergen Reactions  . Vicodin [Hydrocodone-Acetaminophen] Nausea And Vomiting    Past Surgical History:  Procedure Laterality Date  . ENDOVENOUS ABLATION SAPHENOUS VEIN W/ LASER Right 12-29-2015   endovenous laser ablation right greater saphenous vein, stab phlebectomy > 20 incisions right leg, sclerotherapy right leg by Gretta Beganodd Early MD    . MINOR MUSCLE BIOPSY Right 02/11/2018   Procedure: RECTUS FEMORIS MUSCLE BIOPSY;  Surgeon: Darnell LevelGerkin, Todd, MD;  Location: [facility];  Service: General;  Laterality: Right;  . MUSCLE BIOPSY Right 02/11/2018   Procedure: DELTOID MUSCLE BIOPSY;  Surgeon: Darnell LevelGerkin, Todd, MD;  Location: [facility];  Service: General;  Laterality: Right;  . NASAL FRACTURE SURGERY      Social History   Socioeconomic History  . Marital status: Married    Spouse name: Not on file  . Number of children: 1  . Years of education: 4413  . Highest education level: Some college, no degree  Occupational History  . Occupation: not working  Engineer, productionocial Needs  . Financial resource strain: Not on file  . Food insecurity:    Worry: Not on file    Inability: Not on file  . Transportation needs:    Medical: Not on file    Non-medical: Not on file  Tobacco Use  . Smoking status: Never Smoker  . Smokeless tobacco: Former Engineer, waterUser  Substance and Sexual Activity  . Alcohol use: Yes    Comment: occasionally  . Drug use: No  . Sexual activity: Yes  Lifestyle  . Physical activity:    Days per week: Not on file    Minutes per session: Not on file  . Stress: Not on file  Relationships  . Social connections:    Talks on phone: Not on file    Gets together: Not on file    Attends religious service: Not on  file    Active member of club or organization: Not on file    Attends meetings of clubs or organizations: Not on file    Relationship status: Not on file  Other Topics Concern  . Not on file  Social History Narrative   Lives with wife and daughter in a one story home.  Has one child.  Currently not working.  Education: some college.     Family History  Problem Relation Age of Onset  . Varicose Veins Brother      ROS Review of Systems See HPI Constitution: No fevers or chills No malaise No diaphoresis Skin: No rash or itching Eyes: no blurry vision, no double vision GU: no dysuria or hematuria Neuro: no dizziness or headaches * all others reviewed and negative   Objective: There were no vitals filed for this  visit.  Physical Exam  Assessment and Plan There are no diagnoses linked to this encounter.   Joseph Ayala P [employer]

## 2018-02-20 ENCOUNTER — Other Ambulatory Visit: Payer: 59

## 2018-02-27 MED FILL — BUPRENORPHIN-NALOXON 8-2 MG: 8-2 | 15 days supply | Qty: 30 | Fill #0

## 2018-03-05 ENCOUNTER — Telehealth: Payer: Self-pay | Admitting: Neurology

## 2018-03-05 NOTE — Progress Notes (Deleted)
Subjective:    Patient ID: Joseph Ayala, male    DOB: 01-Feb-1966, 52 y.o.   MRN: [id number]  HPI:  Joseph Ayala is here to establish as a new pt.  He is a pleasant 52 year old male. PMH: 02/11/18- underwent muscle biopsy of the deltoid muscle and rectus femoris muscle. Patient has a history dating back several years of elevated CK levels, muscle atrophy, muscle weakness, and low back and leg pain. He has undergone evaluation by physiatry, neurosurgery, rheumatology, and neurology. He has had multiple studies including MRI scan and EMG. He is now referred for muscle biopsy as part of his evaluation. Patient has not had any previous such procedure. He is not on any anticoagulation. He does not have any bleeding disorders.  Patient Care Team    Relationship Specialty Notifications Start End  Nila Nephew, MD PCP - General Internal Medicine  08/18/15     Patient Active Problem List   Diagnosis Date Noted  . HyperCKemia 12/27/2017  . Generalized muscle weakness 12/27/2017  . Varicose veins of leg with edema 12/29/2015  . LIVER FUNCTION TESTS, ABNORMAL 06/10/2007     Past Medical History:  Diagnosis Date  . Degenerative disc disease, lumbar   . Dental crown present   . History of kidney stones   . Hypertension    states under control with meds., has been on med. x 1 yr. - is currently out of med., to see PCP 02/06/2018  . Muscle atrophy 01/2018  . Muscle weakness 01/2018     Past Surgical History:  Procedure Laterality Date  . ENDOVENOUS ABLATION SAPHENOUS VEIN W/ LASER Right 12-29-2015   endovenous laser ablation right greater saphenous vein, stab phlebectomy > 20 incisions right leg, sclerotherapy right leg by Gretta Began MD    . MINOR MUSCLE BIOPSY Right 02/11/2018   Procedure: RECTUS FEMORIS MUSCLE BIOPSY;  Surgeon: Darnell Level, MD;  Location: [facility];  Service: General;  Laterality: Right;  . MUSCLE BIOPSY Right 02/11/2018   Procedure: DELTOID MUSCLE BIOPSY;   Surgeon: Darnell Level, MD;  Location: [facility];  Service: General;  Laterality: Right;  . NASAL FRACTURE SURGERY       Family History  Problem Relation Age of Onset  . Varicose Veins Brother      Social History   Substance and Sexual Activity  Drug Use No     Social History   Substance and Sexual Activity  Alcohol Use Yes   Comment: occasionally     Social History   Tobacco Use  Smoking Status Never Smoker  Smokeless Tobacco Former User     Outpatient Encounter Medications as of 03/06/2018  Medication Sig  . acetaminophen (TYLENOL) 325 MG tablet Take 2,000 mg by mouth every 6 (six) hours as needed.  Marland Kitchen amLODipine (NORVASC) 5 MG tablet TAKE 1 TABLET BY MOUTH ONCE DAILY  . buprenorphine-naloxone (SUBOXONE) 8-2 mg SUBL SL tablet Place 0.5 tablets under the tongue 2 (two) times daily.  . hydrochlorothiazide (HYDRODIURIL) 25 MG tablet Take 25 mg by mouth daily.  Marland Kitchen ibuprofen (ADVIL,MOTRIN) 800 MG tablet Take 1 tablet (800 mg total) by mouth 3 (three) times daily.  Marland Kitchen ibuprofen (ADVIL,MOTRIN) 800 MG tablet Take 1 tablet (800 mg total) by mouth every 6 (six) hours as needed for mild pain or moderate pain.  Marland Kitchen testosterone cypionate (DEPO-TESTOSTERONE) 200 MG/ML injection Inject 400 mg into the muscle every 14 (fourteen) days.   . traMADol (ULTRAM) 50 MG tablet Take  1-2 tablets (50-100 mg total) by mouth every 6 (six) hours as needed for moderate pain.   No facility-administered encounter medications on file as of 03/06/2018.     Allergies: Vicodin [hydrocodone-acetaminophen]  There is no height or weight on file to calculate BMI.  There were no vitals taken for this visit.     Review of Systems     Objective:   Physical Exam        Assessment & Plan:

## 2018-03-05 NOTE — Telephone Encounter (Signed)
Patient called to see if results would be given by Dr. Allena KatzPatel? Please Advise. Thanks

## 2018-03-06 ENCOUNTER — Ambulatory Visit: Payer: 59 | Admitting: Adult Health

## 2018-03-07 ENCOUNTER — Encounter (HOSPITAL_COMMUNITY): Payer: Self-pay | Admitting: Surgery

## 2018-03-07 ENCOUNTER — Telehealth: Payer: Self-pay | Admitting: *Deleted

## 2018-03-07 NOTE — Telephone Encounter (Signed)
I spoke with patient and gave him the appointment time and date.

## 2018-03-07 NOTE — Telephone Encounter (Signed)
-----   Message from Glendale Chardonika K Patel, DO sent at 03/06/2018  5:21 PM EDT ----- Morrie SheldonAshley, please let patient know that I'd like to see him in the office on 9/5 at 11:30a (will need to add appt) for follow-up visit to go over his biopsy results. Thanks.

## 2018-03-07 NOTE — Telephone Encounter (Signed)
See previous note

## 2018-03-13 ENCOUNTER — Encounter: Payer: Self-pay | Admitting: *Deleted

## 2018-03-13 ENCOUNTER — Encounter: Payer: Self-pay | Admitting: Neurology

## 2018-03-13 ENCOUNTER — Other Ambulatory Visit (INDEPENDENT_AMBULATORY_CARE_PROVIDER_SITE_OTHER): Payer: 59

## 2018-03-13 ENCOUNTER — Ambulatory Visit: Payer: 59 | Admitting: Neurology

## 2018-03-13 VITALS — BP 140/94 | HR 96 | Ht 70.0 in | Wt 206.2 lb

## 2018-03-13 DIAGNOSIS — M62512 Muscle wasting and atrophy, not elsewhere classified, left shoulder: Secondary | ICD-10-CM

## 2018-03-13 DIAGNOSIS — G729 Myopathy, unspecified: Secondary | ICD-10-CM

## 2018-03-13 DIAGNOSIS — R292 Abnormal reflex: Secondary | ICD-10-CM

## 2018-03-13 DIAGNOSIS — M5412 Radiculopathy, cervical region: Secondary | ICD-10-CM | POA: Diagnosis not present

## 2018-03-13 NOTE — Progress Notes (Signed)
Follow-up Visit   Date:  03/13/2018    [name] MRN: [id number] DOB: 12/02/1965   Interim History: Joseph Ayala is a 52 y.o. right-handed Caucasian male with hypertension, chronic low back pain, and low testosterone and history opioid addiction returning to the clinic for follow-up of hyperCKemia and generalized weakness.  The patient was accompanied to the clinic by wife who also provides collateral information.    History of present illness: Around ~2008, he started having fatigue and his CK levels.  Over the following 5 years, he noticed reduced stamina and worsening fatigue.  He started seeing Dr. Trudie Reed in 2015 who suggested muscle biopsy, but this was declined by patient as he was otherwise asymptomatic.    Around the same time, he began having chronic stabbing pain in the low back.  He has been seeing Dr. Nelva Bush for [facility] which was effective briefly.  Since October 2018, he started having worsening low back pain which would limit his ability to function.  He feels that even if his pain was well-controlled, he still gets fatigued within 10-15 minutes and developed soreness and achy pain of the arms and legs.  He feels loss of muscle mass over the arms in the past 6 months.  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 Recent MRI lumbar spine from February 2019 showed progression of fat atrophy in the paraspinal muscles.   He has noticed greater difficulty opening bottles, climbing stairs, and exertional dyspnea.  He sleeps on one pillow.  He gets cramps in the lower legs.    He denies double vision, difficulty swallowing, speech changes, or dark colored urine.   His parents are both living and healthy.  He has three older brothers, one of which had open heart surgery at the age of 3, and no complications since then.  He has one 75 year old daughter who is healthy.  No family history of neuromuscular disorders.     UPDATE 03/13/2018:  He underwent muscle biopsy. 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 His surgical scar is healing well.  There has been no significant change with respect to his arm and leg weakness.  He stays active at home with chores such as yard work.  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    Medications:  Current Outpatient Medications on File Prior to Visit  Medication Sig Dispense Refill  . acetaminophen (TYLENOL) 325 MG tablet Take 2,000 mg by mouth every 6 (six) hours as needed.    Marland Kitchen amLODipine (NORVASC) 5 MG tablet TAKE 1 TABLET BY MOUTH ONCE DAILY 30 tablet 0  . buprenorphine-naloxone (SUBOXONE) 8-2 mg SUBL SL tablet Place 0.5 tablets under the tongue 2 (two) times daily.    . hydrochlorothiazide (HYDRODIURIL) 25 MG tablet Take 25 mg by mouth daily.    Marland Kitchen ibuprofen (ADVIL,MOTRIN) 800 MG tablet Take 1 tablet (800 mg total) by mouth 3 (three) times daily. 21 tablet 0  . testosterone cypionate (DEPO-TESTOSTERONE) 200 MG/ML injection Inject 400 mg into the muscle every 14 (fourteen) days.      No current facility-administered medications on file prior to visit.     Allergies:  Allergies  Allergen Reactions  . Vicodin [Hydrocodone-Acetaminophen] Nausea And Vomiting  Review of Systems:  CONSTITUTIONAL: No fevers, chills, night sweats, or weight loss.  EYES: No visual changes or eye pain ENT: No hearing changes.  No history of nose bleeds.   RESPIRATORY: No cough, wheezing and shortness of breath.   CARDIOVASCULAR: Negative for chest pain, and palpitations.   GI: Negative for abdominal discomfort, blood in stools or black stools.  No recent change in bowel habits.   GU:  No history of incontinence.   MUSCLOSKELETAL: +history of joint pain or  swelling.  +myalgias.   SKIN: Negative for lesions, rash, and itching.   ENDOCRINE: Negative for cold or heat intolerance, polydipsia or goiter.   PSYCH:  no depression or anxiety symptoms.   NEURO: As Above.   Vital Signs:  BP (!) 140/94   Pulse 96   Ht '5\' 10"'  (1.778 m)   Wt 206 lb 4 oz (93.6 kg)   SpO2 94%   BMI 29.59 kg/m   General Medical Exam:   General:  Well appearing, comfortable  Eyes/ENT: see cranial nerve examination.   Neck: No masses appreciated.  Full range of motion without tenderness.  No carotid bruits. Respiratory:  Clear to auscultation, good air entry bilaterally.   Cardiac:  Regular rate and rhythm, no murmur.   Ext:  No edema  Neurological Exam: MENTAL STATUS including orientation to time, place, person, recent and remote memory, attention span and concentration, language, and fund of knowledge is normal.  Speech is not dysarthric.  CRANIAL NERVES: Pupils equal round and reactive to light.  Normal conjugate, extra-ocular eye movements in all directions of gaze.  Mild bilateral ptosis.  There is mild temporal wasting and sunken cheeks.  Face is symmetric. Palate elevates symmetrically.  Tongue is midline.  MOTOR:  Moderate supraspinatus and mild infraspinatus atrophy, worse on the left.  No fasciculations or abnormal movements.  No pronator drift.  Tone is normal.   Neck flexion and extension is 5-/5.  Right Upper Extremity:    Left Upper Extremity:    Deltoid  5/5   Deltoid  5-/5   Biceps  5/5   Biceps  5-/5   Infraspinatus 4/5  Infraspinatus 4/5  Medial pectoralis 5/5  Medial pectoralis 5/5  Triceps  5/5   Triceps  5-/5   Wrist extensors  5-/5   Wrist extensors  5-/5   Wrist flexors  5-/5   Wrist flexors  5-/5   Finger extensors  5-/5   Finger extensors  5-/5   Finger flexors  5-/5   Finger flexors  5-/5   Dorsal interossei  4+/5   Dorsal interossei  4+/5   Abductor pollicis  4+/5   Abductor pollicis  4+/5   Tone (Ashworth scale)  0   Tone (Ashworth scale)  0   Right Lower Extremity:    Left Lower Extremity:    Hip flexors  5-/5   Hip flexors  5-/5   Hip extensors  5-/5   Hip extensors  5-/5   Knee flexors  5-/5   Knee flexors  5-/5   Knee extensors  5/5   Knee extensors  5/5   Dorsiflexors  4+/5   Dorsiflexors  4+/5   Plantarflexors  4+/5   Plantarflexors  4+/5   Toe extensors  4+/5   Toe extensors  4+/5   Toe flexors  4+/5   Toe flexors  4+/5   Tone (Ashworth scale)  0  Tone (Ashworth scale)  0   MSRs:    Right  Left brachioradialis 2+  brachioradialis 2+  biceps 3+  biceps 3+  triceps 2+  triceps 2+  patellar 2+  patellar 2+  ankle jerk 2+  ankle jerk 2+   COORDINATION/GAIT: Normal finger-to- nose-finger.  Intact rapid alternating movements bilaterally.  Able to rise from a chair without using arms.  Gait narrow based and stable. He can perform squat and stand to rise without difficulty.  Data: MRI lumbar spine 08/27/2017: 1.  Mild L2-3 spinal canal stenosis with mild narrowing of subarticular zones, progressed. Mild left neural foraminal narrowing, progressed. 2.  Mild bilateral L4-5 and mild right L5-S1 neural foraminal narrowing, not significantly changed. 3.  Interval progression in fatty replacement and atrophy of erector spinae muscles, mostly along the longissmus muscles. 4.  Degenerative minimal anterolisthesis of L4 on L5.  Labs 11/28/2017: CK 1923, aldolase 15.6, TSH 1.2, CBC within normal limits, ESR 6, CRP 0.7, anti-RNP 2.6, hepatitis panel negative  NCS/EMG of the left side 01/07/3018: 1. 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  2. Probable intraspinal canal lesion at the left L4 nerve root/segment.  3. Left median neuropathy at or distal to the wrist, consistent with clinical diagnosis of carpal tunnel syndrome. Overall, these findings are mild to moderate in degree  electrically. 4. Left ulnar neuropathy with slowing across the elbow, purely demyelinating in type and mild in degree electrically.  IMPRESSION/PLAN: Mr. Bialecki is a 52 year old man with chronic low back pain, generalized weakness, and hyperCKemia returning to discuss results of muscle biopsy.  Thus far, work-up shows MRI lumbar spine which shows paraspinal muscles atrophy and fatty infiltrate on MRI lumbar spine and sparse non-necrotizing myopathic changes affecting the left proximal muscles on NCS/EMG.    Muscle biopsy was performed on 02/11/2018 of the right deltoid and right rectus femoris muscle.  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  No rimmed vacuoles or ragged red fibers. No evidence of vasculitis or inflammation. .   Because of the lack of inflammatory changes or response to steroids in the past which was used for pain, I doubt this is an immune-mediated myopathy.  To be complete, I will check myositis panel.  He is not on statin therapy.    His exam showing weakness involving the face, periscapular muscles, and distal pattern, makes muscular dystrophy, such as FSHD is high on the differential. He has no family history of muscular dystrophy.  We discussed genetic testing for this condition and paperwork was provided to determine cost to patient prior to ordering. Myotonic dystrophy is another consideration, but he does not have myotonia.  Becker's muscular dystrophy is also in the differential. Lack of rimmed vacuoles and ragged red fibers makes inclusion body myositis low.  Because there is brisk reflexes in the arms and neurogenic atrophy in the deltoid, I recommend MRI cervical spine to evaluate for compressive lesion.  He was unable to tolerate sedation with  valium and regular MRI, so will refer him to [facility] for open MRI.  He has history of substance abuse and lost his last valium prescription, so will be cautious prescribing controlled substances.  I have encouraged him to seek employment that involved less physical work, such as a Designer, multimedia.  He has high anxiety and keeping his mind and body active will help distract  him from ongoing work-up, which both patient and his wife agree with.   He had many questions which were answered.  Further recommendations pending results.   Greater than 50% of this 40 minute visit was spent in counseling, explanation of diagnosis, planning of further management, and coordination of care.   Thank you for allowing me to participate in patient's care.  If I can answer any additional questions, I would be pleased to do so.    Sincerely,    Donika K. Posey Pronto, DO

## 2018-03-18 ENCOUNTER — Other Ambulatory Visit: Payer: Self-pay | Admitting: Physician Assistant

## 2018-03-18 MED FILL — TESTOSTERONE CYP 200 MG/ML: 200 | 84 days supply | Qty: 12 | Fill #0

## 2018-03-18 NOTE — Telephone Encounter (Signed)
amlodipine refill Last Refill:02/06/18 # 30 Last OV: 10/26/17 PCP: Deliah Boston Pharmacy: [facility]  Please see provider note dated 10/26/17 for prescription instructions

## 2018-03-19 MED FILL — AMLODIPINE BESYLATE 5 MG TA: 5 | 30 days supply | Qty: 30 | Fill #0

## 2018-03-21 ENCOUNTER — Encounter (HOSPITAL_COMMUNITY): Payer: Self-pay | Admitting: Neurology

## 2018-03-21 LAB — MYOSITIS ASSESSR PLUS JO-1 AUTOABS
EJ AUTOABS: NOT DETECTED
JO-1 AUTOABS: NEGATIVE AI
KU AUTOABS: NOT DETECTED
Mi-2 Autoabs: NOT DETECTED
OJ Autoabs: NOT DETECTED
PL-12 Autoabs: NOT DETECTED
PL-7 Autoabs: NOT DETECTED
SRP AUTOABS: NOT DETECTED

## 2018-03-26 MED FILL — BUPRENORPHIN-NALOXON 8-2 MG: 8-2 | 15 days supply | Qty: 30 | Fill #0

## 2018-03-28 ENCOUNTER — Telehealth: Payer: Self-pay | Admitting: *Deleted

## 2018-03-28 NOTE — Telephone Encounter (Signed)
Patient given results.  He will call [facility] to schedule the MRI.

## 2018-03-28 NOTE — Telephone Encounter (Signed)
-----   Message from [place] Chardonika K Patel, DO sent at 03/28/2018  1:11 PM EDT ----- Please notify patient labs looking for inflammatory muscle disease is within normal limits. Do we have a date for his MRI cervical spine?   Thank you.

## 2018-04-03 ENCOUNTER — Telehealth: Payer: Self-pay | Admitting: Neurology

## 2018-04-03 NOTE — Telephone Encounter (Signed)
OK to give prednisone taper over one week, he does not have inflammatory muscle disease, so may not help weakness, but may alleviate pain.  Does he have a date for his MRI?

## 2018-04-03 NOTE — Telephone Encounter (Signed)
Patient called and left a voicemail that he needed to speak with a nurse. Please call him back at [phone]. Thanks!

## 2018-04-03 NOTE — Telephone Encounter (Signed)
Patient is having to do extra walking since his father is in the hospital and his legs start burning, and feel tired.  He said you had mentioned prednisone.  Please advise.

## 2018-04-04 ENCOUNTER — Other Ambulatory Visit: Payer: Self-pay | Admitting: *Deleted

## 2018-04-04 MED ORDER — PREDNISONE 10 MG (21) PO TBPK: ORAL_TABLET | ORAL | 0 refills | Status: DC

## 2018-04-04 NOTE — Telephone Encounter (Signed)
I called patient and let him know that I will be sending the Rx for prednisone 6 day taper.  He said that he does not have MRI appointment yet so I informed him that I will resend the order.

## 2018-04-18 ENCOUNTER — Telehealth: Payer: Self-pay | Admitting: Neurology

## 2018-04-18 NOTE — Telephone Encounter (Signed)
We've tried several times with routine and open MRI.  Please let him know we will refer him for MRI under general anesthesia at [facility].  I do not recommend ongoing prednisone as this is not inflammatory condition.    Donika K. Allena Katz, DO

## 2018-04-18 NOTE — Telephone Encounter (Signed)
Patient is calling in saying he went for an MRI and had a little bit of a panic attack He was calling to see if the next time he could get some medication beforehand. The MRI is scheduled for the 15th. He also wanted to know if he could get back on the Prednisone. He needs a prescription sent to [facility]. Please call him back at [phone]. Thanks!

## 2018-04-18 NOTE — Telephone Encounter (Signed)
Please advise 

## 2018-04-21 NOTE — Telephone Encounter (Signed)
PATIENT IS AWARE THAT I AM TAKING CARE OF THIS AND HE HAS BEEN DENIED THE PREDNISONE.

## 2018-04-21 NOTE — Telephone Encounter (Signed)
Patient calling to check status of the call please call [phone]

## 2018-04-22 ENCOUNTER — Other Ambulatory Visit: Payer: Self-pay | Admitting: *Deleted

## 2018-04-22 DIAGNOSIS — R292 Abnormal reflex: Secondary | ICD-10-CM

## 2018-04-22 DIAGNOSIS — G729 Myopathy, unspecified: Secondary | ICD-10-CM

## 2018-04-22 DIAGNOSIS — M6281 Muscle weakness (generalized): Secondary | ICD-10-CM

## 2018-04-22 DIAGNOSIS — M5412 Radiculopathy, cervical region: Secondary | ICD-10-CM

## 2018-04-22 DIAGNOSIS — M62512 Muscle wasting and atrophy, not elsewhere classified, left shoulder: Secondary | ICD-10-CM

## 2018-04-23 ENCOUNTER — Telehealth: Payer: Self-pay | Admitting: Neurology

## 2018-04-23 NOTE — Telephone Encounter (Signed)
Patient called and left a voicemail wanting a refill on his prescription for prednisone for strength and endurance. He also said he has his MRI scheduled. He did not leave the pharmacy name. If you need to call him back it's [phone]. Thanks!

## 2018-04-23 NOTE — Telephone Encounter (Signed)
Per phone encounter on 04/18/18 pt is aware that prednisone was denied by Dr. Allena Katz.

## 2018-04-24 MED FILL — BUPRENORPHIN-NALOXON 8-2 MG: 8-2 | 15 days supply | Qty: 30 | Fill #0

## 2018-04-25 DIAGNOSIS — M5136 Other intervertebral disc degeneration, lumbar region: Secondary | ICD-10-CM | POA: Diagnosis not present

## 2018-04-25 DIAGNOSIS — G894 Chronic pain syndrome: Secondary | ICD-10-CM | POA: Diagnosis not present

## 2018-04-25 DIAGNOSIS — Z79899 Other long term (current) drug therapy: Secondary | ICD-10-CM | POA: Diagnosis not present

## 2018-04-25 MED FILL — predniSONE 50 MG TABS: 50 | 7 days supply | Qty: 7 | Fill #0

## 2018-05-14 ENCOUNTER — Ambulatory Visit: Payer: 59 | Admitting: Neurology

## 2018-05-20 ENCOUNTER — Encounter (HOSPITAL_COMMUNITY): Payer: Self-pay | Admitting: *Deleted

## 2018-05-20 ENCOUNTER — Other Ambulatory Visit: Payer: Self-pay

## 2018-05-20 ENCOUNTER — Telehealth: Payer: Self-pay | Admitting: Neurology

## 2018-05-20 NOTE — Telephone Encounter (Signed)
I spoke with MRI tech and let her know that patient is being seen in the morning and that we will fax the paper work right after his appointment.

## 2018-05-20 NOTE — Telephone Encounter (Signed)
Person left msg with after hours regarding needing to speak with someone about a MRI that was order by Dr.Patel. They need some updated info. Please call [phone]. They did not leave a name. Thanks!

## 2018-05-21 ENCOUNTER — Telehealth: Payer: Self-pay | Admitting: *Deleted

## 2018-05-21 ENCOUNTER — Ambulatory Visit: Payer: 59 | Admitting: Neurology

## 2018-05-21 NOTE — Telephone Encounter (Signed)
Called patient and informed him that his MRI for tomorrow has been cancelled and rescheduled for Jan. 2.  Arrival time is 6 am.

## 2018-05-22 ENCOUNTER — Ambulatory Visit (HOSPITAL_COMMUNITY): Payer: 59

## 2018-05-22 ENCOUNTER — Other Ambulatory Visit: Payer: Self-pay | Admitting: Family Medicine

## 2018-05-22 NOTE — Telephone Encounter (Signed)
30 courtesy refill already given. LOV 10/26/17. No PCP assigned. TC to patient. He will schedule an appointment. Refused until appointment scheduled. Patient aware.  Requested Prescriptions  Refused Prescriptions Disp Refills  . amLODipine (NORVASC) 5 MG tablet [Pharmacy Med Name: AMLODIPINE BESYLATE 5 MG TA 5 TAB] 30 tablet 0    Sig: TAKE 1 TABLET BY MOUTH ONCE DAILY     Cardiovascular:  Calcium Channel Blockers Failed - 05/22/2018 11:48 AM      Failed - Last BP in normal range    BP Readings from Last 1 Encounters:  03/13/18 (!) 140/94         Failed - Valid encounter within last 6 months    Recent Outpatient Visits          6 months ago Uncontrolled hypertension   [facility], Marolyn HammockMichael L, PA-C   6 years ago Back pain   [facility] Gwenlyn FoundJessica C, MD

## 2018-05-28 DIAGNOSIS — F1920 Other psychoactive substance dependence, uncomplicated: Secondary | ICD-10-CM | POA: Insufficient documentation

## 2018-05-28 MED FILL — CloNIDine HCL 0.1 MG TAB: 0.1 | 10 days supply | Qty: 30 | Fill #0

## 2018-05-30 ENCOUNTER — Other Ambulatory Visit: Payer: Self-pay

## 2018-05-30 ENCOUNTER — Encounter: Payer: Self-pay | Admitting: Emergency Medicine

## 2018-05-30 ENCOUNTER — Telehealth: Payer: Self-pay | Admitting: Emergency Medicine

## 2018-05-30 ENCOUNTER — Ambulatory Visit: Payer: 59 | Admitting: Emergency Medicine

## 2018-05-30 VITALS — BP 165/104 | HR 89 | Temp 97.9°F | Resp 18 | Ht 71.0 in | Wt 204.4 lb

## 2018-05-30 DIAGNOSIS — M545 Low back pain, unspecified: Secondary | ICD-10-CM

## 2018-05-30 DIAGNOSIS — I10 Essential (primary) hypertension: Secondary | ICD-10-CM | POA: Diagnosis not present

## 2018-05-30 DIAGNOSIS — G8929 Other chronic pain: Secondary | ICD-10-CM | POA: Diagnosis not present

## 2018-05-30 MED ORDER — TRAMADOL HCL 50 MG PO TABS: 50.0000 mg | ORAL_TABLET | Freq: Three times a day (TID) | ORAL | 0 refills | Status: DC | PRN

## 2018-05-30 MED ORDER — AMLODIPINE BESYLATE 5 MG PO TABS: 5.0000 mg | ORAL_TABLET | Freq: Every day | ORAL | 3 refills | Status: DC

## 2018-05-30 MED ORDER — KETOROLAC TROMETHAMINE 60 MG/2ML IM SOLN
60.0000 mg | Freq: Once | INTRAMUSCULAR | Status: AC
  Administered 2018-05-30: 60 mg via INTRAMUSCULAR

## 2018-05-30 MED FILL — traMADol HCL 50 MG TABS: 50 | 10 days supply | Qty: 30 | Fill #0

## 2018-05-30 MED FILL — AMLODIPINE BESYLATE 5 MG TA: 5 | 90 days supply | Qty: 90 | Fill #0

## 2018-05-30 NOTE — Progress Notes (Signed)
Joseph Ayala 52 y.o.   Chief Complaint  Patient presents with  . Establish Care    was seeing Dr Chestine Spore but he left. Having trouble with back. Have a copy of MRI if you would like to take a look at  . PHQ9-2 Week Follow-up     PHQ9 - 14    HISTORY OF PRESENT ILLNESS: This is a 52 y.o. male with history of hypertension, ran out of amlodipine 2 months ago.  First visit with me.  Here to establish care. Also has a history of chronic low back pain.  Recent MRIs shows multiple significant changes.  Under the care of orthopedist.  Requesting some medication for pain to hold him over until he can follow-up with his pain management doctor.  HPI   Prior to Admission medications   Medication Sig Start Date End Date Taking? Authorizing Provider  acetaminophen (TYLENOL) 325 MG tablet Take 2,000 mg by mouth every 6 (six) hours as needed.   Yes [provider]  cloNIDine (CATAPRES) 0.1 MG tablet Take 0.1 mg by mouth 2 (two) times daily.   Yes [provider]  ibuprofen (ADVIL,MOTRIN) 200 MG tablet Take 800 mg by mouth every 8 (eight) hours as needed (for pain.).   Yes [provider]  testosterone cypionate (DEPO-TESTOSTERONE) 200 MG/ML injection Inject 400 mg into the muscle every 14 (fourteen) days.    Yes [provider]  amLODipine (NORVASC) 5 MG tablet TAKE 1 TABLET BY MOUTH ONCE DAILY Patient not taking: Reported on 05/19/2018 03/19/18   Myles Lipps, MD  buprenorphine-naloxone (SUBOXONE) 8-2 mg SUBL SL tablet Place 1 tablet under the tongue daily.     [provider]    Allergies  Allergen Reactions  . Vicodin [Hydrocodone-Acetaminophen] Nausea And Vomiting    Patient Active Problem List   Diagnosis Date Noted  . HyperCKemia 12/27/2017  . Generalized muscle weakness 12/27/2017  . Varicose veins of leg with edema 12/29/2015  . LIVER FUNCTION TESTS, ABNORMAL 06/10/2007    Past Medical History:  Diagnosis Date  . Degenerative disc  disease, lumbar   . Dental crown present   . History of kidney stones   . Hypertension    states under control with meds., has been on med. x 1 yr. - is currently out of med., to see PCP 02/06/2018  . Muscle atrophy 01/2018  . Muscle weakness 01/2018    Past Surgical History:  Procedure Laterality Date  . ENDOVENOUS ABLATION SAPHENOUS VEIN W/ LASER Right 12-29-2015   endovenous laser ablation right greater saphenous vein, stab phlebectomy > 20 incisions right leg, sclerotherapy right leg by Gretta Began MD    . MINOR MUSCLE BIOPSY Right 02/11/2018   Procedure: RECTUS FEMORIS MUSCLE BIOPSY;  Surgeon: Darnell Level, MD;  Location: [facility];  Service: General;  Laterality: Right;  . MUSCLE BIOPSY Right 02/11/2018   Procedure: DELTOID MUSCLE BIOPSY;  Surgeon: Darnell Level, MD;  Location: [facility];  Service: General;  Laterality: Right;  . NASAL FRACTURE SURGERY      Social History   Socioeconomic History  . Marital status: Married    Spouse name: Not on file  . Number of children: 1  . Years of education: 18  . Highest education level: Some college, no degree  Occupational History  . 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 Financial resource strain: Not on file  . Food insecurity:    Worry: Not on file  Inability: Not on file  . Transportation needs:    Medical: Not on file    Non-medical: Not on file  Tobacco Use  . Smoking status: Never Smoker  . Smokeless tobacco: Former Engineer, water[employer]  Substance and Sexual Activity  . Alcohol use: Yes    Comment: occasionally  . Drug use: No  . Sexual activity: Yes    Partners: Female  Lifestyle  . Physical activity:    Days per week: Not on file    Minutes per session: Not on file  . Stress: Not on file  Relationships  . Social connections:    Talks on phone: Not on file    Gets together: Not on file    Attends religious service: Not on file    Active member of club or organization: Not on file    Attends  meetings of clubs or organizations: Not on file    Relationship status: Not on file  . Intimate partner violence:    Fear of current or ex partner: Not on file    Emotionally abused: Not on file    Physically abused: Not on file    Forced sexual activity: Not on file  Other Topics Concern  . Not on file  Social History Narrative   Lives with wife and daughter in a one story home.  Has one child.  Currently not working.  Education: some college.     Family History  Problem Relation Age of Onset  . Varicose Veins Brother      Review of Systems  Constitutional: Negative.  Negative for chills and fever.  HENT: Negative.  Negative for sore throat.   Eyes: Negative.  Negative for blurred vision and double vision.  Respiratory: Negative.  Negative for cough and shortness of breath.   Cardiovascular: Negative.  Negative for chest pain and palpitations.  Gastrointestinal: Negative.  Negative for abdominal pain, diarrhea, nausea and vomiting.  Genitourinary: Negative.  Negative for dysuria and hematuria.  Musculoskeletal: Positive for back pain.  Skin: Negative.  Negative for rash.  Neurological: Negative.  Negative for dizziness and headaches.  Endo/Heme/Allergies: Negative.   All other systems reviewed and are negative.     Vitals:   05/30/18 1142  BP: (!) 165/104  Pulse: 89  Resp: 18  Temp: 97.9 F (36.6 C)  SpO2: 99%    Physical Exam  Constitutional: He is oriented to person, place, and time. He appears well-developed and well-nourished.  HENT:  Head: Normocephalic.  Eyes: Pupils are equal, round, and reactive to light.  Neck: Normal range of motion. Neck supple.  Cardiovascular: Normal rate and regular rhythm.  Pulmonary/Chest: Effort normal.  Musculoskeletal: Normal range of motion.  Neurological: He is alert and oriented to person, place, and time.  Psychiatric: He has a normal mood and affect. His behavior is normal.     ASSESSMENT & PLAN: Arline AspScot was seen today  for establish care and phq9-2 week follow-up.  Diagnoses and all orders for this visit:  Uncontrolled hypertension -     amLODipine (NORVASC) 5 MG tablet; Take 1 tablet (5 mg total) by mouth daily.  Chronic bilateral low back pain without sciatica -     ketorolac (TORADOL) injection 60 mg -     traMADol (ULTRAM) 50 MG tablet; Take 1 tablet (50 mg total) by mouth every 8 (eight) hours as needed.    Patient Instructions       If you have lab work done today you will be contacted with your  lab results within the next 2 weeks.  If you have not heard from [place] then please contact us. The fastest way to get your results is to register for My Chart.   IF you received an x-ray today, you will receive an invoice from [facility]. Please contact [facility] at [phone] with questions or concerns regarding your invoice.   IF you received labwork today, you will receive an invoice from [facility]. Please contact [facility] at [phone] with questions or concerns regarding your invoice.   Our billing staff will not be able to assist you with questions regarding bills from these companies.  You will be contacted with the lab results as soon as they are available. The fastest way to get your results is to activate your My Chart account. Instructions are located on the last page of this paperwork. If you have not heard from [place] regarding the results in 2 weeks, please contact this office.      Back Pain, Adult Back pain is very common. The pain often gets better over time. The cause of back pain is usually not dangerous. Most people can learn to manage their back pain on their own. Follow these instructions at home: Watch your back pain for any changes. The following actions may help to lessen any pain you are feeling:  Stay active. Start with short walks on flat ground if you can. Try to walk farther each day.  Exercise regularly as told by your doctor. Exercise helps your  back heal faster. It also helps avoid future injury by keeping your muscles strong and flexible.  Do not sit, drive, or stand in one place for more than 30 minutes.  Do not stay in bed. Resting more than 1-2 days can slow down your recovery.  Be careful when you bend or lift an object. Use good form when lifting: ? Bend at your knees. ? Keep the object close to your body. ? Do not twist.  Sleep on a firm mattress. Lie on your side, and bend your knees. If you lie on your back, put a pillow under your knees.  Take medicines only as told by your doctor.  Put ice on the injured area. ? Put ice in a plastic bag. ? Place a towel between your skin and the bag. ? Leave the ice on for 20 minutes, 2-3 times a day for the first 2-3 days. After that, you can switch between ice and heat packs.  Avoid feeling anxious or stressed. Find good ways to deal with stress, such as exercise.  Maintain a healthy weight. Extra weight puts stress on your back.  Contact a doctor if:  You have pain that does not go away with rest or medicine.  You have worsening pain that goes down into your legs or buttocks.  You have pain that does not get better in one week.  You have pain at night.  You lose weight.  You have a fever or chills. Get help right away if:  You cannot control when you poop (bowel movement) or pee (urinate).  Your arms or legs feel weak.  Your arms or legs lose feeling (numbness).  You feel sick to your stomach (nauseous) or throw up (vomit).  You have belly (abdominal) pain.  You feel like you may pass out (faint). This information is not intended to replace advice given to you by your health care provider. Make sure you discuss any questions you have with your health care provider. Document  Released: 12/12/2007 Document Revised: 12/01/2015 Document Reviewed: 10/27/2013 Elsevier Interactive Patient Education  2018 [employer].  Hypertension Hypertension, commonly called  high blood pressure, is when the force of blood pumping through the arteries is too strong. The arteries are the blood vessels that carry blood from the heart throughout the body. Hypertension forces the heart to work harder to pump blood and may cause arteries to become narrow or stiff. Having untreated or uncontrolled hypertension can cause heart attacks, strokes, kidney disease, and other problems. A blood pressure reading consists of a higher number over a lower number. Ideally, your blood pressure should be below 120/80. The first ("top") number is called the systolic pressure. It is a measure of the pressure in your arteries as your heart beats. The second ("bottom") number is called the diastolic pressure. It is a measure of the pressure in your arteries as the heart relaxes. What are the causes? The cause of this condition is not known. What increases the risk? Some risk factors for high blood pressure are under your control. Others are not. Factors you can change  Smoking.  Having type 2 diabetes mellitus, high cholesterol, or both.  Not getting enough exercise or physical activity.  Being overweight.  Having too much fat, sugar, calories, or salt (sodium) in your diet.  Drinking too much alcohol. Factors that are difficult or impossible to change  Having chronic kidney disease.  Having a family history of high blood pressure.  Age. Risk increases with age.  Race. You may be at higher risk if you are African-American.  Gender. Men are at higher risk than women before age 35. After age 67, women are at higher risk than men.  Having obstructive sleep apnea.  Stress. What are the signs or symptoms? Extremely high blood pressure (hypertensive crisis) may cause:  Headache.  Anxiety.  Shortness of breath.  Nosebleed.  Nausea and vomiting.  Severe chest pain.  Jerky movements you cannot control (seizures).  How is this diagnosed? This condition is diagnosed by  measuring your blood pressure while you are seated, with your arm resting on a surface. The cuff of the blood pressure monitor will be placed directly against the skin of your upper arm at the level of your heart. It should be measured at least twice using the same arm. Certain conditions can cause a difference in blood pressure between your right and left arms. Certain factors can cause blood pressure readings to be lower or higher than normal (elevated) for a short period of time:  When your blood pressure is higher when you are in a health care provider's office than when you are at home, this is called white coat hypertension. Most people with this condition do not need medicines.  When your blood pressure is higher at home than when you are in a health care provider's office, this is called masked hypertension. Most people with this condition may need medicines to control blood pressure.  If you have a high blood pressure reading during one visit or you have normal blood pressure with other risk factors:  You may be asked to return on a different day to have your blood pressure checked again.  You may be asked to monitor your blood pressure at home for 1 week or longer.  If you are diagnosed with hypertension, you may have other blood or imaging tests to help your health care provider understand your overall risk for other conditions. How is this treated? This condition is treated  by making healthy lifestyle changes, such as eating healthy foods, exercising more, and reducing your alcohol intake. Your health care provider may prescribe medicine if lifestyle changes are not enough to get your blood pressure under control, and if:  Your systolic blood pressure is above 130.  Your diastolic blood pressure is above 80.  Your personal target blood pressure may vary depending on your medical conditions, your age, and other factors. Follow these instructions at home: Eating and drinking  Eat a  diet that is high in fiber and potassium, and low in sodium, added sugar, and fat. An example eating plan is called the DASH (Dietary Approaches to Stop Hypertension) diet. To eat this way: ? Eat plenty of fresh fruits and vegetables. Try to fill half of your plate at each meal with fruits and vegetables. ? Eat whole grains, such as whole wheat pasta, brown rice, or whole grain bread. Fill about one quarter of your plate with whole grains. ? Eat or drink low-fat dairy products, such as skim milk or low-fat yogurt. ? Avoid fatty cuts of meat, processed or cured meats, and poultry with skin. Fill about one quarter of your plate with lean proteins, such as fish, chicken without skin, beans, eggs, and tofu. ? Avoid premade and processed foods. These tend to be higher in sodium, added sugar, and fat.  Reduce your daily sodium intake. Most people with hypertension should eat less than 1,500 mg of sodium a day.  Limit alcohol intake to no more than 1 drink a day for nonpregnant women and 2 drinks a day for men. One drink equals 12 oz of beer, 5 oz of wine, or 1 oz of hard liquor. Lifestyle  Work with your health care provider to maintain a healthy body weight or to lose weight. Ask what an ideal weight is for you.  Get at least 30 minutes of exercise that causes your heart to beat faster (aerobic exercise) most days of the week. Activities may include walking, swimming, or biking.  Include exercise to strengthen your muscles (resistance exercise), such as pilates or lifting weights, as part of your weekly exercise routine. Try to do these types of exercises for 30 minutes at least 3 days a week.  Do not use any products that contain nicotine or tobacco, such as cigarettes and e-cigarettes. If you need help quitting, ask your health care provider.  Monitor your blood pressure at home as told by your health care provider.  Keep all follow-up visits as told by your health care provider. This is  important. Medicines  Take over-the-counter and prescription medicines only as told by your health care provider. Follow directions carefully. Blood pressure medicines must be taken as prescribed.  Do not skip doses of blood pressure medicine. Doing this puts you at risk for problems and can make the medicine less effective.  Ask your health care provider about side effects or reactions to medicines that you should watch for. Contact a health care provider if:  You think you are having a reaction to a medicine you are taking.  You have headaches that keep coming back (recurring).  You feel dizzy.  You have swelling in your ankles.  You have trouble with your vision. Get help right away if:  You develop a severe headache or confusion.  You have unusual weakness or numbness.  You feel faint.  You have severe pain in your chest or abdomen.  You vomit repeatedly.  You have trouble breathing. Summary  Hypertension  is when the force of blood pumping through your arteries is too strong. If this condition is not controlled, it may put you at risk for serious complications.  Your personal target blood pressure may vary depending on your medical conditions, your age, and other factors. For most people, a normal blood pressure is less than 120/80.  Hypertension is treated with lifestyle changes, medicines, or a combination of both. Lifestyle changes include weight loss, eating a healthy, low-sodium diet, exercising more, and limiting alcohol. This information is not intended to replace advice given to you by your health care provider. Make sure you discuss any questions you have with your health care provider. Document Released: 06/25/2005 Document Revised: 05/23/2016 Document Reviewed: 05/23/2016 Elsevier Interactive Patient Education  2018 [employer] Inc[employer]      Edwina Barth, MD [facility]

## 2018-05-30 NOTE — Patient Instructions (Addendum)
If you have lab work done today you will be contacted with your lab results within the next 2 weeks.  If you have not heard from [place] then please contact us. The fastest way to get your results is to register for My Chart.   IF you received an x-ray today, you will receive an invoice from [facility]. Please contact [facility] at [phone] with questions or concerns regarding your invoice.   IF you received labwork today, you will receive an invoice from [facility]. Please contact [facility] at [phone] with questions or concerns regarding your invoice.   Our billing staff will not be able to assist you with questions regarding bills from these companies.  You will be contacted with the lab results as soon as they are available. The fastest way to get your results is to activate your My Chart account. Instructions are located on the last page of this paperwork. If you have not heard from [place] regarding the results in 2 weeks, please contact this office.      Back Pain, Adult Back pain is very common. The pain often gets better over time. The cause of back pain is usually not dangerous. Most people can learn to manage their back pain on their own. Follow these instructions at home: Watch your back pain for any changes. The following actions may help to lessen any pain you are feeling:  Stay active. Start with short walks on flat ground if you can. Try to walk farther each day.  Exercise regularly as told by your doctor. Exercise helps your back heal faster. It also helps avoid future injury by keeping your muscles strong and flexible.  Do not sit, drive, or stand in one place for more than 30 minutes.  Do not stay in bed. Resting more than 1-2 days can slow down your recovery.  Be careful when you bend or lift an object. Use good form when lifting: ? Bend at your knees. ? Keep the object close to your body. ? Do not twist.  Sleep on a firm mattress. Lie on  your side, and bend your knees. If you lie on your back, put a pillow under your knees.  Take medicines only as told by your doctor.  Put ice on the injured area. ? Put ice in a plastic bag. ? Place a towel between your skin and the bag. ? Leave the ice on for 20 minutes, 2-3 times a day for the first 2-3 days. After that, you can switch between ice and heat packs.  Avoid feeling anxious or stressed. Find good ways to deal with stress, such as exercise.  Maintain a healthy weight. Extra weight puts stress on your back.  Contact a doctor if:  You have pain that does not go away with rest or medicine.  You have worsening pain that goes down into your legs or buttocks.  You have pain that does not get better in one week.  You have pain at night.  You lose weight.  You have a fever or chills. Get help right away if:  You cannot control when you poop (bowel movement) or pee (urinate).  Your arms or legs feel weak.  Your arms or legs lose feeling (numbness).  You feel sick to your stomach (nauseous) or throw up (vomit).  You have belly (abdominal) pain.  You feel like you may pass out (faint). This information is not intended to replace advice given to you by your health  care provider. Make sure you discuss any questions you have with your health care provider. Document Released: 12/12/2007 Document Revised: 12/01/2015 Document Reviewed: 10/27/2013 Elsevier Interactive Patient Education  2018 [employer].  Hypertension Hypertension, commonly called high blood pressure, is when the force of blood pumping through the arteries is too strong. The arteries are the blood vessels that carry blood from the heart throughout the body. Hypertension forces the heart to work harder to pump blood and may cause arteries to become narrow or stiff. Having untreated or uncontrolled hypertension can cause heart attacks, strokes, kidney disease, and other problems. A blood pressure reading  consists of a higher number over a lower number. Ideally, your blood pressure should be below 120/80. The first ("top") number is called the systolic pressure. It is a measure of the pressure in your arteries as your heart beats. The second ("bottom") number is called the diastolic pressure. It is a measure of the pressure in your arteries as the heart relaxes. What are the causes? The cause of this condition is not known. What increases the risk? Some risk factors for high blood pressure are under your control. Others are not. Factors you can change  Smoking.  Having type 2 diabetes mellitus, high cholesterol, or both.  Not getting enough exercise or physical activity.  Being overweight.  Having too much fat, sugar, calories, or salt (sodium) in your diet.  Drinking too much alcohol. Factors that are difficult or impossible to change  Having chronic kidney disease.  Having a family history of high blood pressure.  Age. Risk increases with age.  Race. You may be at higher risk if you are African-American.  Gender. Men are at higher risk than women before age 42. After age 24, women are at higher risk than men.  Having obstructive sleep apnea.  Stress. What are the signs or symptoms? Extremely high blood pressure (hypertensive crisis) may cause:  Headache.  Anxiety.  Shortness of breath.  Nosebleed.  Nausea and vomiting.  Severe chest pain.  Jerky movements you cannot control (seizures).  How is this diagnosed? This condition is diagnosed by measuring your blood pressure while you are seated, with your arm resting on a surface. The cuff of the blood pressure monitor will be placed directly against the skin of your upper arm at the level of your heart. It should be measured at least twice using the same arm. Certain conditions can cause a difference in blood pressure between your right and left arms. Certain factors can cause blood pressure readings to be lower or  higher than normal (elevated) for a short period of time:  When your blood pressure is higher when you are in a health care provider's office than when you are at home, this is called white coat hypertension. Most people with this condition do not need medicines.  When your blood pressure is higher at home than when you are in a health care provider's office, this is called masked hypertension. Most people with this condition may need medicines to control blood pressure.  If you have a high blood pressure reading during one visit or you have normal blood pressure with other risk factors:  You may be asked to return on a different day to have your blood pressure checked again.  You may be asked to monitor your blood pressure at home for 1 week or longer.  If you are diagnosed with hypertension, you may have other blood or imaging tests to help your health care  provider understand your overall risk for other conditions. How is this treated? This condition is treated by making healthy lifestyle changes, such as eating healthy foods, exercising more, and reducing your alcohol intake. Your health care provider may prescribe medicine if lifestyle changes are not enough to get your blood pressure under control, and if:  Your systolic blood pressure is above 130.  Your diastolic blood pressure is above 80.  Your personal target blood pressure may vary depending on your medical conditions, your age, and other factors. Follow these instructions at home: Eating and drinking  Eat a diet that is high in fiber and potassium, and low in sodium, added sugar, and fat. An example eating plan is called the DASH (Dietary Approaches to Stop Hypertension) diet. To eat this way: ? Eat plenty of fresh fruits and vegetables. Try to fill half of your plate at each meal with fruits and vegetables. ? Eat whole grains, such as whole wheat pasta, brown rice, or whole grain bread. Fill about one quarter of your plate  with whole grains. ? Eat or drink low-fat dairy products, such as skim milk or low-fat yogurt. ? Avoid fatty cuts of meat, processed or cured meats, and poultry with skin. Fill about one quarter of your plate with lean proteins, such as fish, chicken without skin, beans, eggs, and tofu. ? Avoid premade and processed foods. These tend to be higher in sodium, added sugar, and fat.  Reduce your daily sodium intake. Most people with hypertension should eat less than 1,500 mg of sodium a day.  Limit alcohol intake to no more than 1 drink a day for nonpregnant women and 2 drinks a day for men. One drink equals 12 oz of beer, 5 oz of wine, or 1 oz of hard liquor. Lifestyle  Work with your health care provider to maintain a healthy body weight or to lose weight. Ask what an ideal weight is for you.  Get at least 30 minutes of exercise that causes your heart to beat faster (aerobic exercise) most days of the week. Activities may include walking, swimming, or biking.  Include exercise to strengthen your muscles (resistance exercise), such as pilates or lifting weights, as part of your weekly exercise routine. Try to do these types of exercises for 30 minutes at least 3 days a week.  Do not use any products that contain nicotine or tobacco, such as cigarettes and e-cigarettes. If you need help quitting, ask your health care provider.  Monitor your blood pressure at home as told by your health care provider.  Keep all follow-up visits as told by your health care provider. This is important. Medicines  Take over-the-counter and prescription medicines only as told by your health care provider. Follow directions carefully. Blood pressure medicines must be taken as prescribed.  Do not skip doses of blood pressure medicine. Doing this puts you at risk for problems and can make the medicine less effective.  Ask your health care provider about side effects or reactions to medicines that you should watch  for. Contact a health care provider if:  You think you are having a reaction to a medicine you are taking.  You have headaches that keep coming back (recurring).  You feel dizzy.  You have swelling in your ankles.  You have trouble with your vision. Get help right away if:  You develop a severe headache or confusion.  You have unusual weakness or numbness.  You feel faint.  You have severe pain in  your chest or abdomen.  You vomit repeatedly.  You have trouble breathing. Summary  Hypertension is when the force of blood pumping through your arteries is too strong. If this condition is not controlled, it may put you at risk for serious complications.  Your personal target blood pressure may vary depending on your medical conditions, your age, and other factors. For most people, a normal blood pressure is less than 120/80.  Hypertension is treated with lifestyle changes, medicines, or a combination of both. Lifestyle changes include weight loss, eating a healthy, low-sodium diet, exercising more, and limiting alcohol. This information is not intended to replace advice given to you by your health care provider. Make sure you discuss any questions you have with your health care provider. Document Released: 06/25/2005 Document Revised: 05/23/2016 Document Reviewed: 05/23/2016 Elsevier Interactive Patient Education  [employer].

## 2018-05-30 NOTE — Telephone Encounter (Signed)
'  Joseph AbrahamsMary Ayala' pharmacist with [facility] calling to report pt at pharmacy for tramadol ordered today. She wanted to clarify PCP was aware he was taking Suboxone as well, per historical provider. States PA sent. During call pt reported he was no longer taking Suboxone; last took 05/25/18 and refill had been denied. Pharmacist states to disregard PA request.

## 2018-06-09 MED FILL — TESTOSTERONE CYP 200 MG/ML: 200 | 84 days supply | Qty: 12 | Fill #1

## 2018-06-30 ENCOUNTER — Encounter

## 2018-06-30 ENCOUNTER — Ambulatory Visit: Payer: 59 | Admitting: Neurology

## 2018-07-10 ENCOUNTER — Ambulatory Visit (HOSPITAL_COMMUNITY): Payer: 59

## 2018-07-10 ENCOUNTER — Ambulatory Visit (HOSPITAL_COMMUNITY): Admission: RE | Admit: 2018-07-10 | Payer: 59 | Source: Ambulatory Visit

## 2018-07-10 SURGERY — MRI WITH ANESTHESIA
Anesthesia: General

## 2018-07-19 ENCOUNTER — Encounter: Payer: Self-pay | Admitting: Emergency Medicine

## 2018-07-19 ENCOUNTER — Other Ambulatory Visit: Payer: Self-pay

## 2018-07-19 ENCOUNTER — Ambulatory Visit
Admission: EM | Admit: 2018-07-19 | Discharge: 2018-07-19 | Disposition: A | Discharge status: Home / Self Care | Payer: 59 | Attending: Family Medicine | Admitting: Family Medicine

## 2018-07-19 ENCOUNTER — Ambulatory Visit: Payer: 59

## 2018-07-19 DIAGNOSIS — R319 Hematuria, unspecified: Secondary | ICD-10-CM

## 2018-07-19 DIAGNOSIS — K5900 Constipation, unspecified: Secondary | ICD-10-CM

## 2018-07-19 LAB — POCT URINALYSIS DIP (MANUAL ENTRY)
Bilirubin, UA: NEGATIVE
Glucose, UA: NEGATIVE mg/dL
Ketones, POC UA: NEGATIVE mg/dL
LEUKOCYTES UA: NEGATIVE
NITRITE UA: NEGATIVE
PH UA: 7 (ref 5.0–8.0)
Protein Ur, POC: NEGATIVE mg/dL
SPEC GRAV UA: 1.02 (ref 1.010–1.025)
UROBILINOGEN UA: 0.2 U/dL

## 2018-07-19 IMAGING — DX DG ABDOMEN 1V
2 series · 2 of 2 positions shown · non-contrast
Comparison: None.

CLINICAL DATA: 52-year-old male with left-sided flank pain and
nausea for the last month. History of kidney stones.

EXAM:
ABDOMEN - 1 VIEW

[abdomen supine ap (1 of 2)]
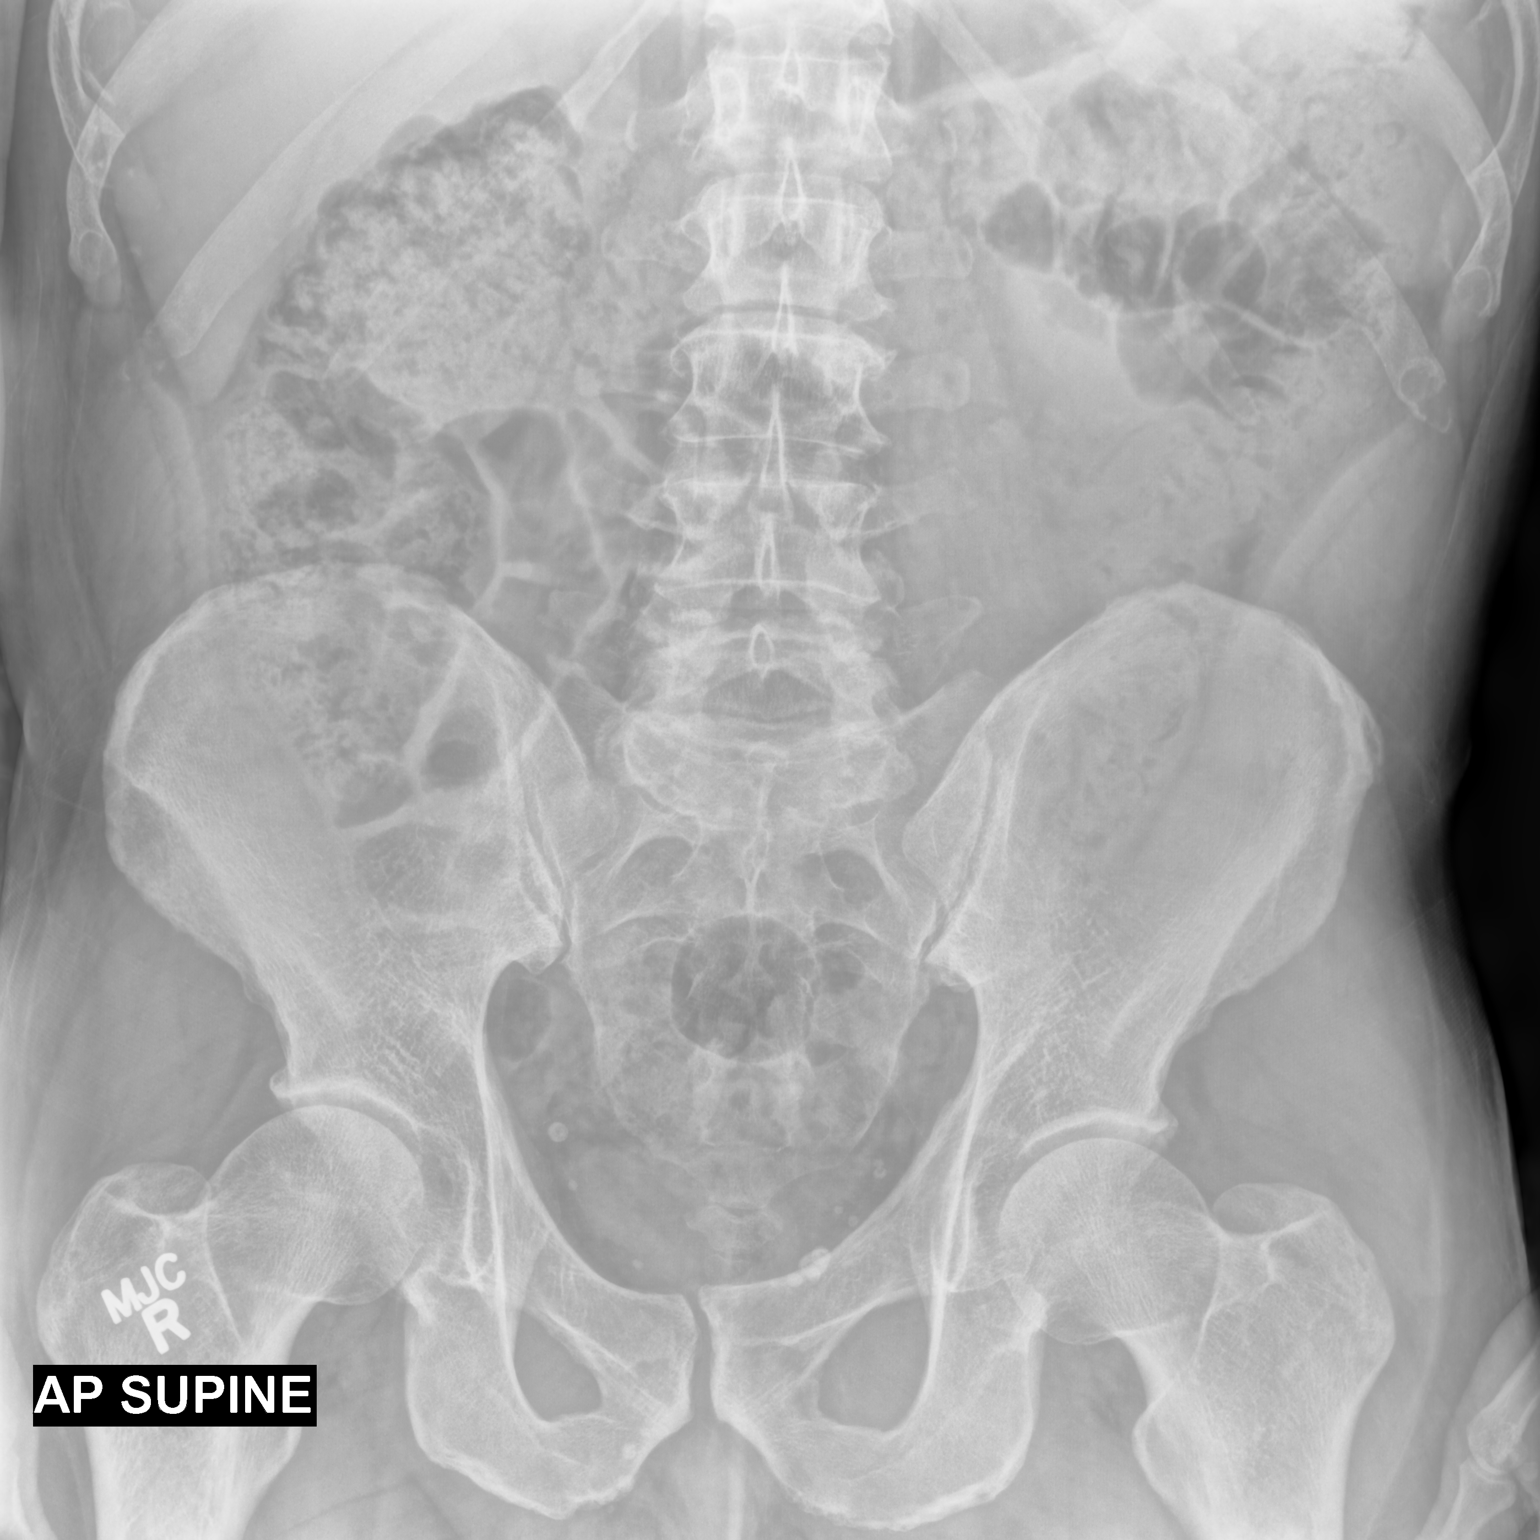

[abdomen supine ap (2 of 2)]
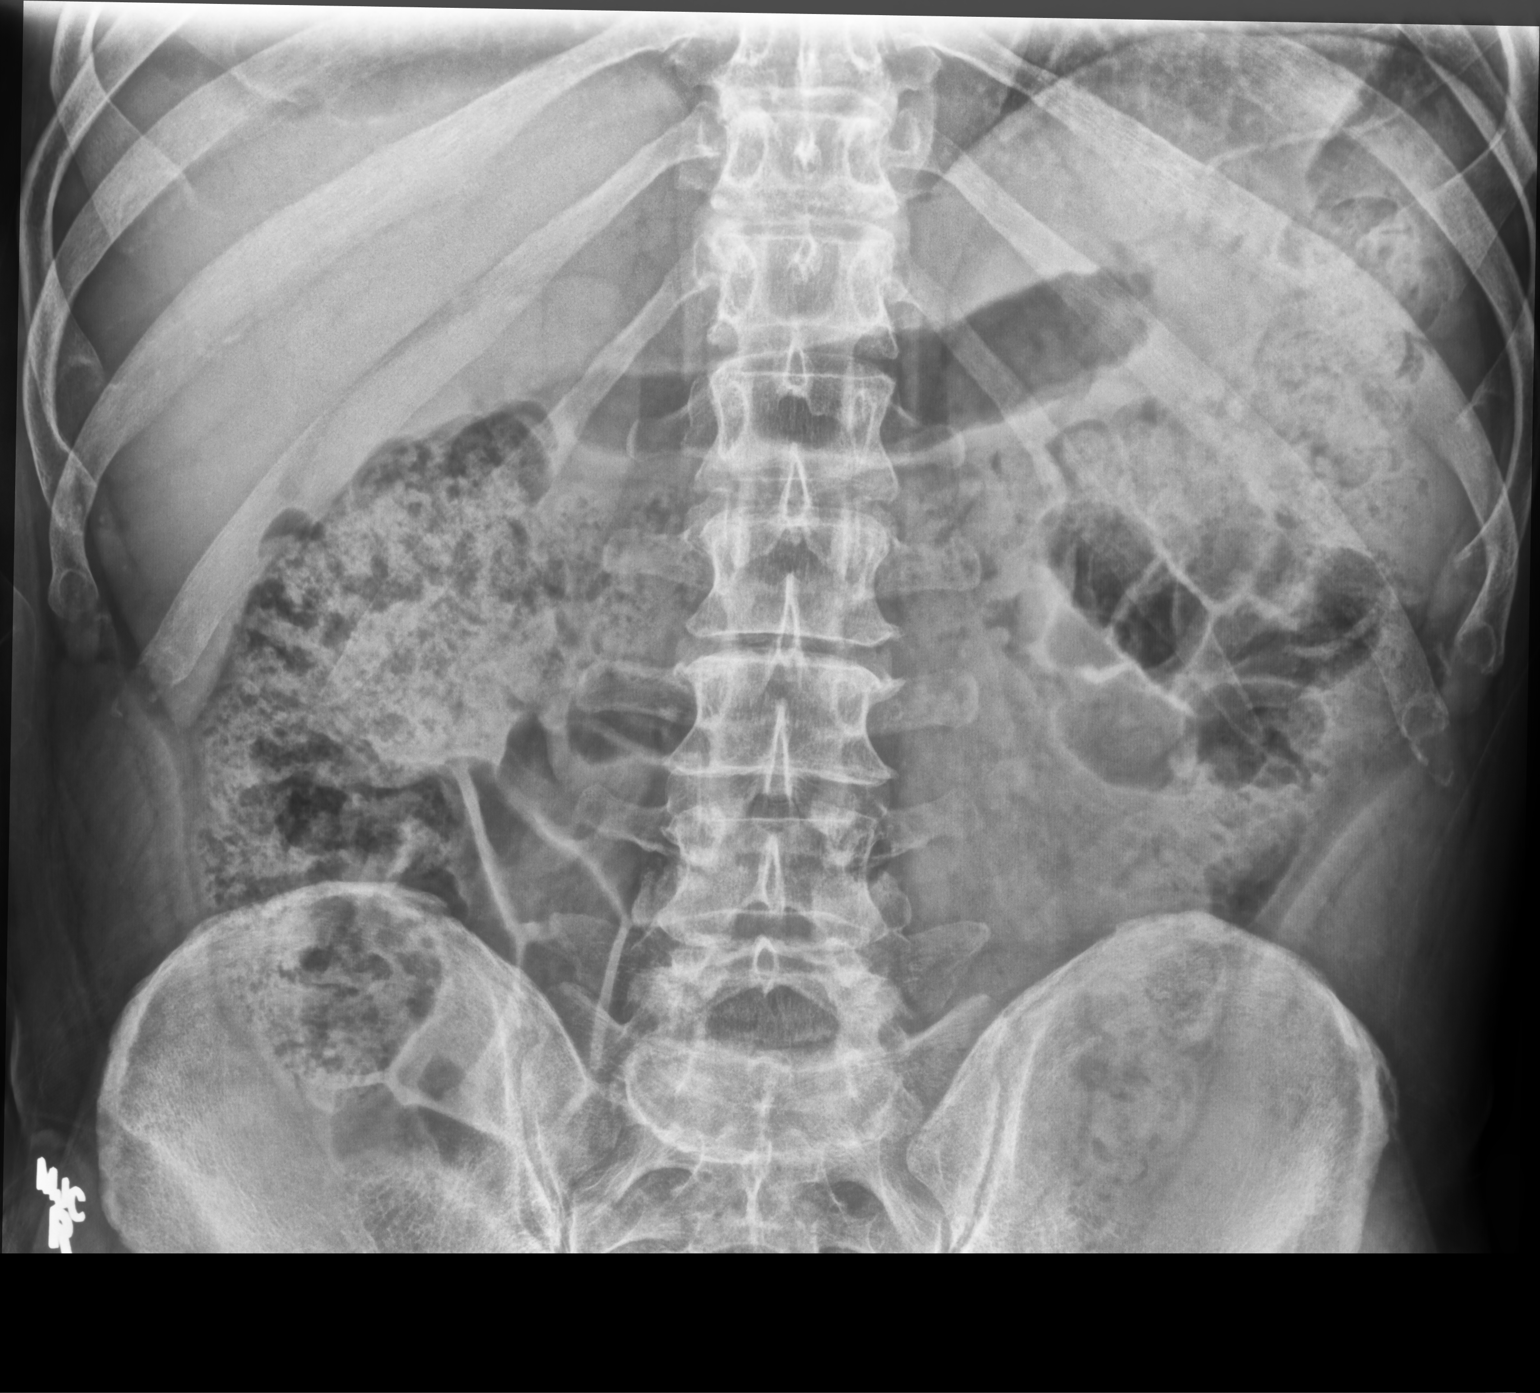

[2 of 2 positions shown; findings below may reference images not displayed]

FINDINGS: Large volume colonic stool burden suggests underlying constipation.
Unfortunately, stool and gas in the colon largely obscures the
interpolar and lower pole regions of the renal shadows bilaterally.
Only the upper poles are well seen. No definite nephrolithiasis
within the limitations of this examination. The bowel gas pattern is
not obstructed. Multiple rounded vascular phleboliths project over
the anatomic pelvis. No evidence of acute osseous abnormality.
IMPRESSION: 1. Large colonic stool burden consistent with constipation.
2. Unfortunately, stool and gas throughout the colon largely obscure
the renal shadows. Evaluation for nephrolithiasis is limited. No
definite kidney stones are visualized.

## 2018-07-19 MED ORDER — NAPROXEN 500 MG PO TABS: 500.0000 mg | ORAL_TABLET | Freq: Two times a day (BID) | ORAL | 0 refills | Status: DC

## 2018-07-19 NOTE — Discharge Instructions (Addendum)
Your x-ray revealed constipation I would like for you to try taking some MiraLAX twice daily until you have a good bowel movement Make sure you are staying hydrated drink plenty water I will send some naproxen to the pharmacy for your pain If you are not better by Monday please follow-up with your doctor for further management.

## 2018-07-19 NOTE — ED Triage Notes (Signed)
Per pt her has been having lower back pain in the left side and has a hx of kidney getting stone. Pt stated he has been having this for a month now. Pt stated he has been progressing getting worse for the last few days. No fever, no chills

## 2018-07-19 NOTE — ED Notes (Signed)
Patient complains of left sided flank pain X approximately 1 month with urinary changes, nausea, and vomiting. Hx of kidney stones.

## 2018-07-19 NOTE — ED Provider Notes (Signed)
[facility]    CSN: [id number] Arrival date & time: 07/19/18  0806     History   Chief Complaint Chief Complaint  Patient presents with  . Back Pain    HPI Joseph Ayala is a 53 y.o. male.   Patient is a 53 year old male with past medical history of degenerative disc disease, kidney stones, hypertension.  He presents with approximately 1 month of waxing waning left flank pain/upper back pain.  Reports that the pain goes from moderate to severe at times.  He describes it as sharp stabbing in the left flank area.  He has had some associated nausea and vomiting at times.  Reports that his urine has been darker than usual. Reports normal bowel movements. Denies any dysuria, fever, chills. Denies any injury or heavy lifting. This feels different than his typical back pain with DDD. No numbness, tingling, saddle paraesthesia. He has been taking tylenol and ibuprofen without much relief of pain.   ROS per HPI      Past Medical History:  Diagnosis Date  . Degenerative disc disease, lumbar   . Dental crown present   . History of kidney stones   . Hypertension    states under control with meds., has been on med. x 1 yr. - is currently out of med., to see PCP 02/06/2018  . Muscle atrophy 01/2018  . Muscle weakness 01/2018    Patient Active Problem List   Diagnosis Date Noted  . HyperCKemia 12/27/2017  . Generalized muscle weakness 12/27/2017  . Varicose veins of leg with edema 12/29/2015  . LIVER FUNCTION TESTS, ABNORMAL 06/10/2007    Past Surgical History:  Procedure Laterality Date  . ENDOVENOUS ABLATION SAPHENOUS VEIN W/ LASER Right 12-29-2015   endovenous laser ablation right greater saphenous vein, stab phlebectomy > 20 incisions right leg, sclerotherapy right leg by Gretta Beganodd Early MD    . MINOR MUSCLE BIOPSY Right 02/11/2018   Procedure: RECTUS FEMORIS MUSCLE BIOPSY;  Surgeon: Darnell LevelGerkin, Todd, MD;  Location: [facility];  Service: General;  Laterality:  Right;  . MUSCLE BIOPSY Right 02/11/2018   Procedure: DELTOID MUSCLE BIOPSY;  Surgeon: Darnell LevelGerkin, Todd, MD;  Location: [facility];  Service: General;  Laterality: Right;  . NASAL FRACTURE SURGERY         Home Medications    Prior to Admission medications   Medication Sig Start Date End Date Taking? Authorizing Provider  acetaminophen (TYLENOL) 325 MG tablet Take 2,000 mg by mouth every 6 (six) hours as needed.    [provider]  amLODipine (NORVASC) 5 MG tablet Take 1 tablet (5 mg total) by mouth daily. 05/30/18 08/28/18  Georgina QuintSagardia, Miguel Jose, MD  buprenorphine-naloxone (SUBOXONE) 8-2 mg SUBL SL tablet Place 1 tablet under the tongue daily.     [provider]  cloNIDine (CATAPRES) 0.1 MG tablet Take 0.1 mg by mouth 2 (two) times daily.    [provider]  ibuprofen (ADVIL,MOTRIN) 200 MG tablet Take 800 mg by mouth every 8 (eight) hours as needed (for pain.).    [provider]  naproxen (NAPROSYN) 500 MG tablet Take 1 tablet (500 mg total) by mouth 2 (two) times daily. 07/19/18   Dahlia ByesBast, Kadence Mikkelson A, NP  testosterone cypionate (DEPO-TESTOSTERONE) 200 MG/ML injection Inject 400 mg into the muscle every 14 (fourteen) days.     [provider]  traMADol (ULTRAM) 50 MG tablet Take 1 tablet (50 mg total) by mouth every 8 (eight) hours as needed. 05/30/18  Georgina Quint, MD    Family History Family History  Problem Relation Age of Onset  . Varicose Veins Brother     Social History Social History   Tobacco Use  . Smoking status: Never Smoker  . Smokeless tobacco: Former Engineer, water Use Topics  . Alcohol use: Yes    Comment: occasionally  . Drug use: No     Allergies   Vicodin [hydrocodone-acetaminophen]   Review of Systems Review of Systems   Physical Exam Triage Vital Signs ED Triage Vitals  Enc Vitals Group     BP 07/19/18 0823 (!) 155/99     Pulse --      Resp --      Temp --      Temp src --       SpO2 07/19/18 0823 97 %     Weight 07/19/18 0822 200 lb (90.7 kg)     Height 07/19/18 0822 5\' 11"  (1.803 m)     Head Circumference --      Peak Flow --      Pain Score 07/19/18 0822 8     Pain Loc --      Pain Edu? --      Excl. in GC? --    No data found.  Updated Vital Signs BP (!) 155/99 (BP Location: Left Arm)   Ht 5\' 11"  (1.803 m)   Wt 200 lb (90.7 kg)   SpO2 97%   BMI 27.89 kg/m   Visual Acuity Right Eye Distance:   Left Eye Distance:   Bilateral Distance:    Right Eye Near:   Left Eye Near:    Bilateral Near:     Physical Exam Vitals signs and nursing note reviewed.  Constitutional:      General: He is not in acute distress.    Appearance: Normal appearance. He is not ill-appearing, toxic-appearing or diaphoretic.  HENT:     Head: Normocephalic and atraumatic.     Nose: Nose normal.  Eyes:     Conjunctiva/sclera: Conjunctivae normal.  Neck:     Musculoskeletal: Normal range of motion.  Pulmonary:     Effort: Pulmonary effort is normal.  Abdominal:     Palpations: Abdomen is soft.     Tenderness: There is no abdominal tenderness.  Musculoskeletal: Normal range of motion.       Arms:     Comments: Non tender to palpation of left flank and thoracic paravertebral muscles. No erythema, rash. No bruising or deformities.   Skin:    General: Skin is warm and dry.     Findings: No rash.  Neurological:     Mental Status: He is alert.  Psychiatric:        Mood and Affect: Mood normal.      UC Treatments / Results  Labs (all labs ordered are listed, but only abnormal results are displayed) Labs Reviewed  POCT URINALYSIS DIP (MANUAL ENTRY) - Abnormal; Notable for the following components:      Result Value   Blood, UA trace-intact (*)    All other components within normal limits    EKG None  Radiology Dg Abd 1 View  Result Date: 07/19/2018 CLINICAL DATA:  53 year old male with left-sided flank pain and nausea for the last month. History of kidney  stones. EXAM: ABDOMEN - 1 VIEW COMPARISON:  None. FINDINGS: Large volume colonic stool burden suggests underlying constipation. Unfortunately, stool and gas in the colon largely obscures the interpolar and lower pole regions  of the renal shadows bilaterally. Only the upper poles are well seen. No definite nephrolithiasis within the limitations of this examination. The bowel gas pattern is not obstructed. Multiple rounded vascular phleboliths project over the anatomic pelvis. No evidence of acute osseous abnormality. IMPRESSION: 1. Large colonic stool burden consistent with constipation. 2. Unfortunately, stool and gas throughout the colon largely obscure the renal shadows. Evaluation for nephrolithiasis is limited. No definite kidney stones are visualized. Electronically Signed   By: Malachy Moan M.D.   On: 07/19/2018 08:50    Procedures Procedures (including critical care time)  Medications Ordered in UC Medications - No data to display  Initial Impression / Assessment and Plan / UC Course  I have reviewed the triage vital signs and the nursing notes.  Pertinent labs & imaging results that were available during my care of the patient were reviewed by me and considered in my medical decision making (see chart for details).     Pt is a 53 year old male that presents with 1 month of waxing and waning left flank pain. Urine revealed trace blood X ray showed large stool burden. Pt symptoms could be coming from constipation. Kidney stone could also be a posisbility  Unable to see kidney stone on x ray due to stool burden and gas. We will go ahead and treat with miralax and have pt follow up with doctor on Monday if symptoms still persist.  Naproxen sent to the pharmacy for pain  Follow up as needed for continued or worsening symptoms   Final Clinical Impressions(s) / UC Diagnoses   Final diagnoses:  Constipation, unspecified constipation type     Discharge Instructions     Your x-ray  revealed constipation I would like for you to try taking some MiraLAX twice daily until you have a good bowel movement Make sure you are staying hydrated drink plenty water I will send some naproxen to the pharmacy for your pain If you are not better by Monday please follow-up with your doctor for further management.    ED Prescriptions    Medication Sig Dispense Auth. Provider   naproxen (NAPROSYN) 500 MG tablet Take 1 tablet (500 mg total) by mouth 2 (two) times daily. 30 tablet Dahlia Byes A, NP     Controlled Substance Prescriptions [place] Controlled Substance Registry consulted? Not Applicable   Janace Aris, NP 07/19/18 [phone]

## 2018-07-21 MED FILL — NAPROXEN 500 MG TABLET: 500 | 15 days supply | Qty: 30 | Fill #0

## 2018-07-30 ENCOUNTER — Ambulatory Visit: Payer: 59 | Admitting: Neurology

## 2018-08-15 ENCOUNTER — Ambulatory Visit
Admission: RE | Admit: 2018-08-15 | Discharge: 2018-08-15 | Disposition: A | Discharge status: Home / Self Care | Payer: 59 | Source: Ambulatory Visit | Attending: Neurology | Admitting: Neurology

## 2018-08-15 DIAGNOSIS — M62512 Muscle wasting and atrophy, not elsewhere classified, left shoulder: Secondary | ICD-10-CM

## 2018-08-15 DIAGNOSIS — M5412 Radiculopathy, cervical region: Secondary | ICD-10-CM

## 2018-08-15 DIAGNOSIS — M50222 Other cervical disc displacement at C5-C6 level: Secondary | ICD-10-CM | POA: Diagnosis not present

## 2018-08-15 DIAGNOSIS — G729 Myopathy, unspecified: Secondary | ICD-10-CM

## 2018-08-15 DIAGNOSIS — R292 Abnormal reflex: Secondary | ICD-10-CM

## 2018-08-15 IMAGING — MR MR CERVICAL SPINE W/O CM
4 of 5 series · 27 of 48 positions shown · non-contrast
Comparison: Prior radiograph from [DATE]

CLINICAL DATA: Initial evaluation for muscle weakness and atrophy
of left shoulder.

EXAM:
MRI CERVICAL SPINE WITHOUT CONTRAST
TECHNIQUE: Multiplanar, multisequence MR imaging of the cervical spine was
performed. No intravenous contrast was administered.

[Series 5: T1 · sagittal · 3.0mm · 0.66mm/px · 7 of 15 slices shown]
[im 1/15]
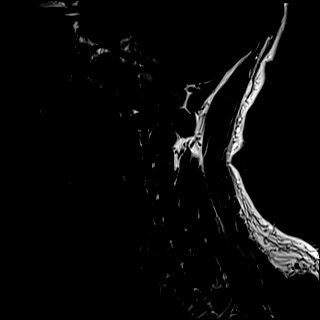
[im 3/15]
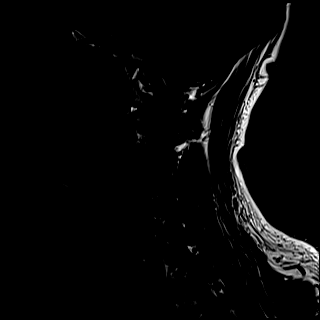
[im 5/15]
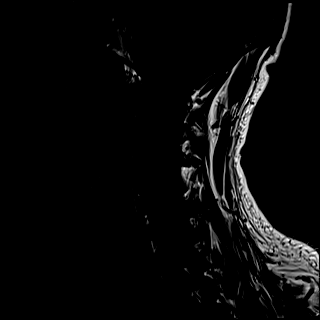
[im 8/15]
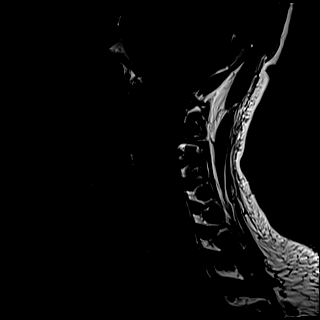
[im 10/15]
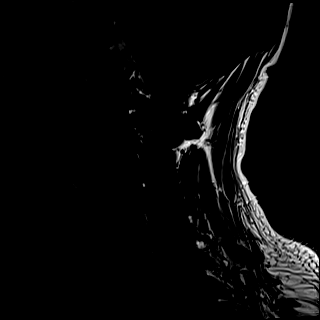
[im 12/15]
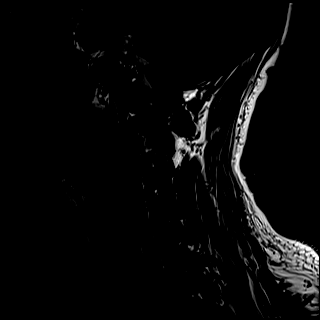
[im 15/15]
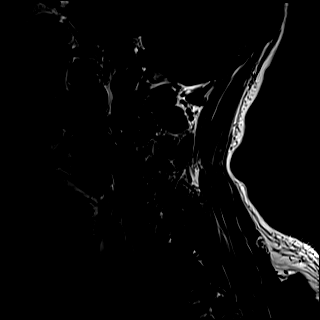

[Series 6: T2 · sagittal · 3.0mm · 0.55mm/px · 7 of 15 slices shown (1 of 2)]
[im 1/15]
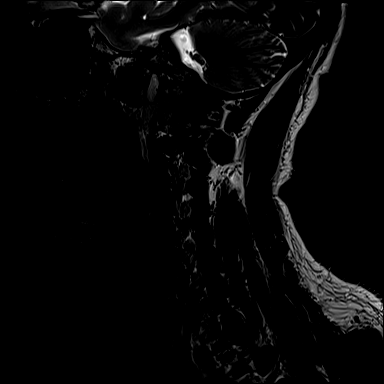
[im 3/15]
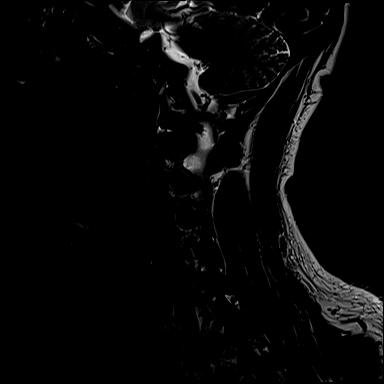
[im 5/15]
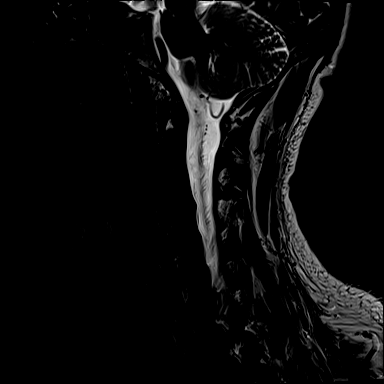
[im 8/15]
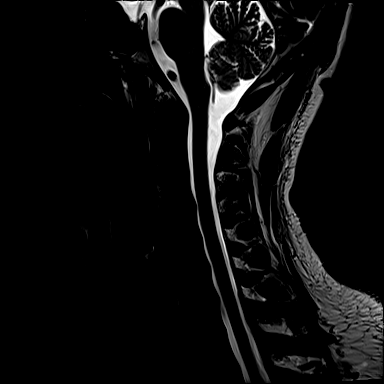
[im 10/15]
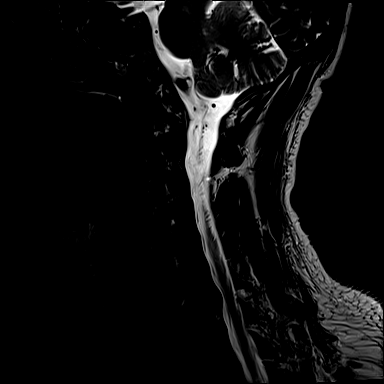
[im 12/15]
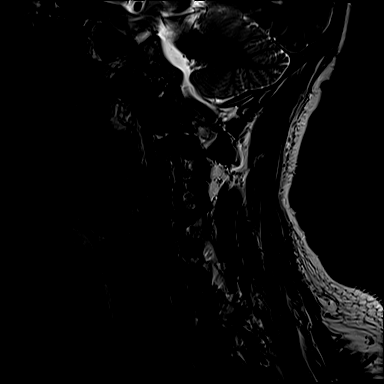
[im 15/15]
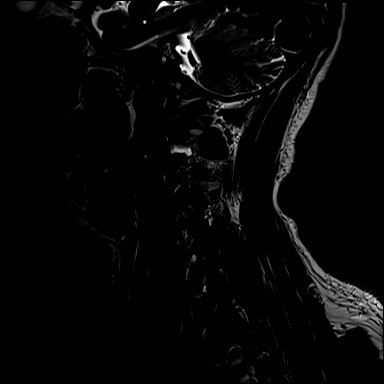

[Series 7: STIR · sagittal · 3.0mm · 0.33mm/px · 5 of 15 slices shown]
[im 1/15]
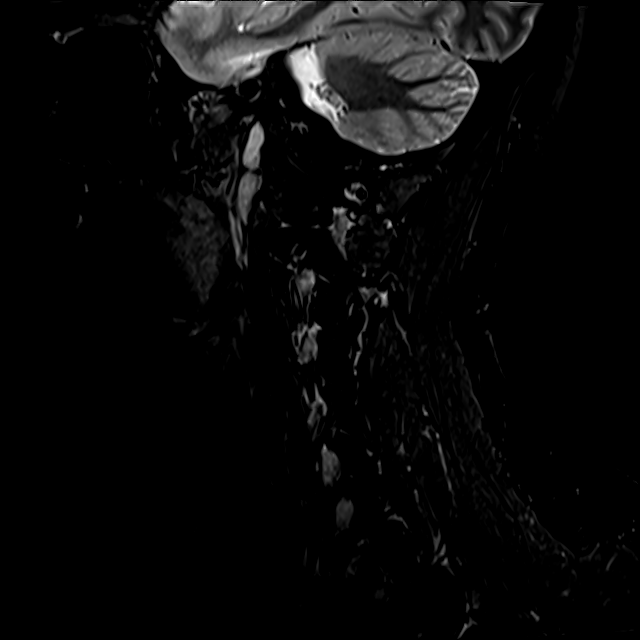
[im 3/15]
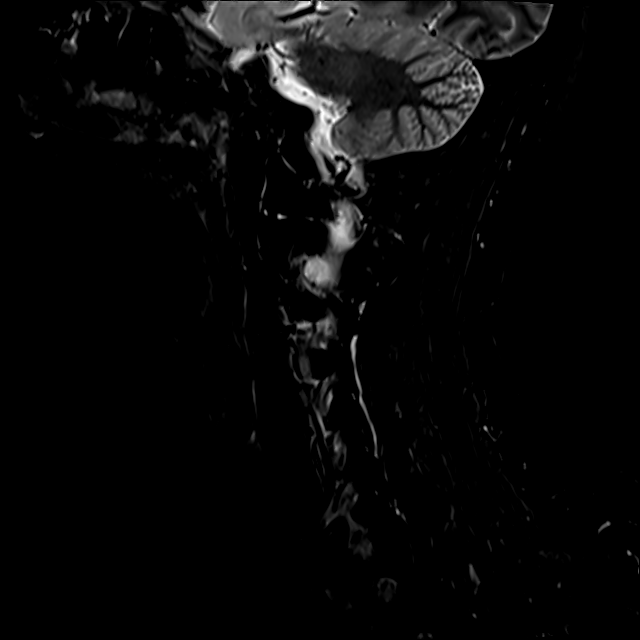
[im 6/15]
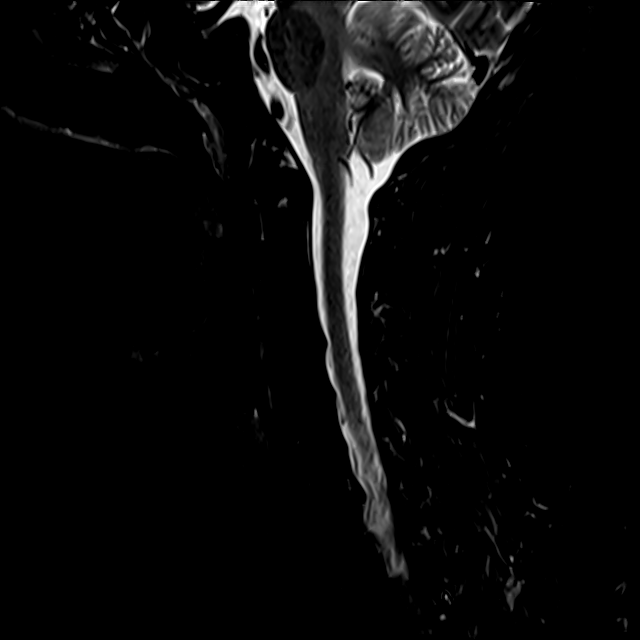
[im 9/15]
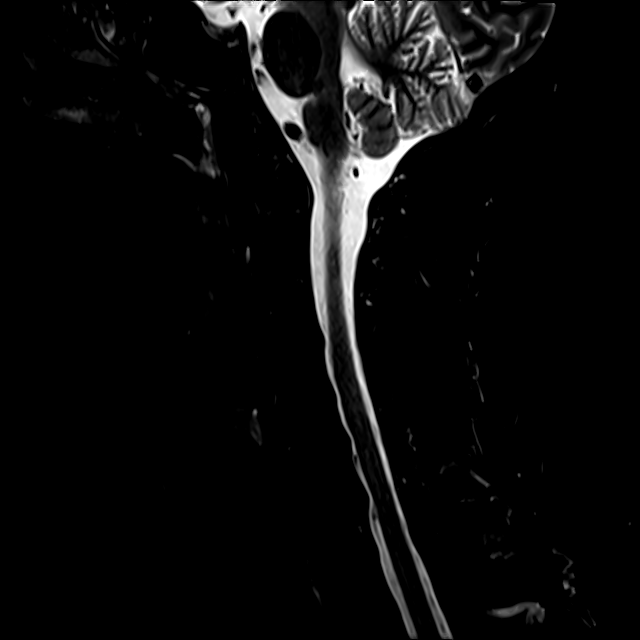
[im 15/15]
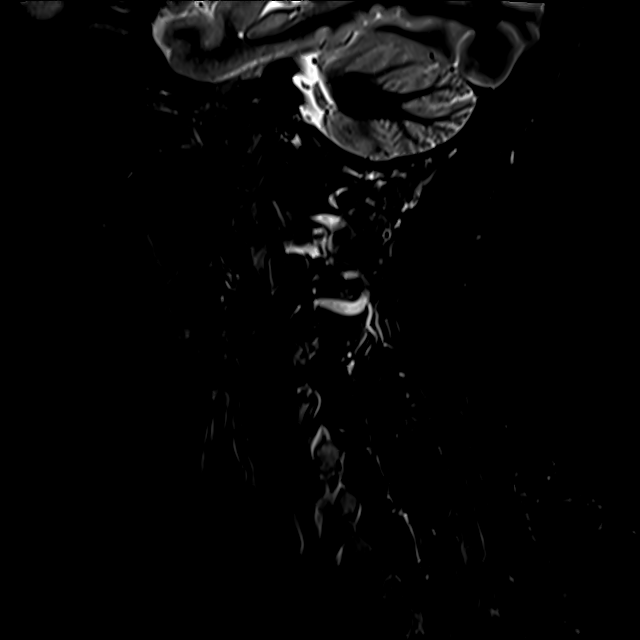

[Series 8: T2 · axial · 3.0mm · 0.50mm/px · z∈[-58,+48]mm · 8 of 34 slices shown (2 of 2)]
[im 1/34]
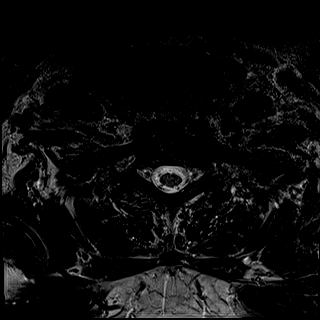
[im 6/34]
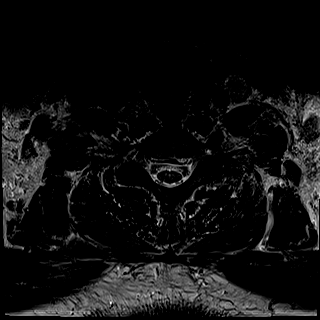
[im 11/34]
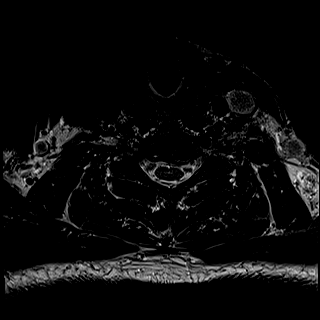
[im 16/34]
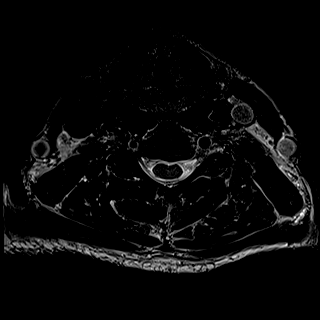
[im 18/34]
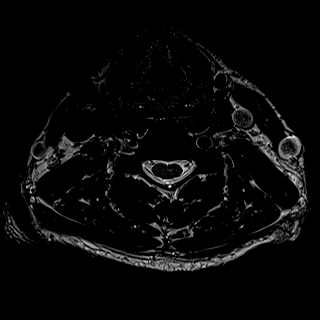
[im 23/34]
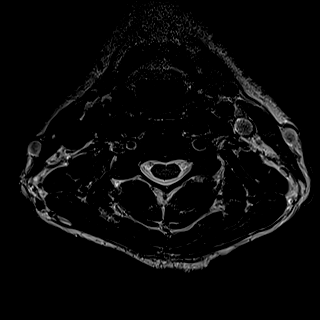
[im 28/34]
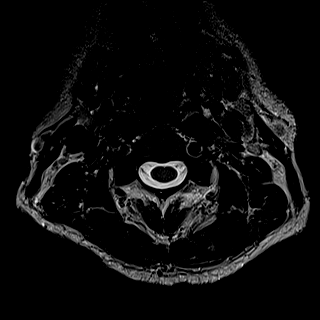
[im 34/34]
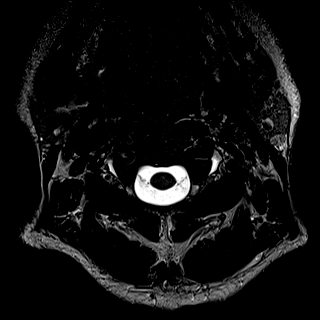

[27 of 48 positions shown; findings below may reference images not displayed]

FINDINGS: Alignment: Straightening of the normal cervical lordosis. No
listhesis or malalignment.

Vertebrae: Vertebral body heights well maintained without evidence
for acute or chronic fracture. Bone marrow signal intensity
diffusely decreased on T1 weighted imaging, most commonly related to
anemia, smoking, or obesity. No discrete or worrisome osseous
lesions. No abnormal marrow edema.

Cord: Signal intensity within the cervical spinal cord is normal.

Posterior Fossa, vertebral arteries, paraspinal tissues: Visualized
brain and posterior fossa within normal limits. Craniocervical
junction normal. Paraspinous and prevertebral soft tissues within
normal limits. Normal intravascular flow voids seen within the
vertebral arteries bilaterally.

Disc levels:

C2-C3: Unremarkable.

C3-C4: Mild diffuse disc bulge with superimposed small central disc
protrusion. Bulging disc mildly flattens the ventral thecal sac
without significant spinal stenosis or cord deformity. Mild
bilateral uncovertebral hypertrophy without significant foraminal
encroachment.

C4-C5: Mild annular disc bulge with uncovertebral hypertrophy. No
significant stenosis.

C5-C6: Shallow left paracentral disc protrusion minimally indents
the ventral thecal sac. Mild flattening of the left hemi cord
without cord signal changes. No significant spinal stenosis.
Foramina remain patent.

C6-C7: Left eccentric disc osteophyte with intervertebral disc space
narrowing. Broad posterior component flattens the ventral thecal
sac, greater on the left. Mild flattening the left hemi cord without
cord signal changes. Moderate left with mild right C7 foraminal
stenosis.

C7-T1:  Unremarkable.

Visualized upper thoracic spine demonstrates no significant finding.
IMPRESSION: 1. Left eccentric disc osteophyte complex at C6-7 with resultant
mild flattening of the left hemi cord and moderate left foraminal
stenosis. Query left C7 radiculitis.
2. Tiny left paracentral disc protrusion at C5-6 with minimal
flattening of the left ventral cord. The ventral left C6 nerve root
could potentially be affected.
3. Mild noncompressive disc bulging at C3-4 and C4-5 without
stenosis.

## 2018-08-21 ENCOUNTER — Telehealth: Payer: Self-pay

## 2018-08-21 NOTE — Telephone Encounter (Signed)
Sorry but decline.

## 2018-08-21 NOTE — Telephone Encounter (Signed)
I am not accepting new patients on controlled substances. I see he is on Suboxone, testosterone and tramadol. I imagine the Suboxone and testosterone are from specialists. If the tramadol was an acute prescription, I am happy to see him. Otherwise we might not be the best fit. TY.

## 2018-08-21 NOTE — Telephone Encounter (Signed)
[facility] Employee. Will you accept?

## 2018-08-21 NOTE — Telephone Encounter (Signed)
Please advise 

## 2018-08-21 NOTE — Telephone Encounter (Signed)
Not taking new patients at this point 

## 2018-08-21 NOTE — Telephone Encounter (Signed)
Copied from CRM [id number]. Topic: General - Other >> Aug 21, 2018  9:47 AM Tamela Oddi wrote: Reason for CRM: Patient called to request to change PCPs because of location and would like to become a patient at the [facility] location.  Please advise and call patient back to confirm that this would be ok.  CB# [phone]/

## 2018-08-22 ENCOUNTER — Ambulatory Visit: Payer: 59 | Admitting: Neurology

## 2018-08-22 ENCOUNTER — Encounter: Payer: Self-pay | Admitting: Neurology

## 2018-08-22 VITALS — BP 118/88 | HR 78 | Ht 71.0 in | Wt 199.2 lb

## 2018-08-22 DIAGNOSIS — M5412 Radiculopathy, cervical region: Secondary | ICD-10-CM | POA: Diagnosis not present

## 2018-08-22 DIAGNOSIS — R748 Abnormal levels of other serum enzymes: Secondary | ICD-10-CM | POA: Diagnosis not present

## 2018-08-22 DIAGNOSIS — M62512 Muscle wasting and atrophy, not elsewhere classified, left shoulder: Secondary | ICD-10-CM

## 2018-08-22 DIAGNOSIS — G729 Myopathy, unspecified: Secondary | ICD-10-CM | POA: Diagnosis not present

## 2018-08-22 DIAGNOSIS — M6281 Muscle weakness (generalized): Secondary | ICD-10-CM

## 2018-08-22 DIAGNOSIS — R292 Abnormal reflex: Secondary | ICD-10-CM | POA: Diagnosis not present

## 2018-08-22 NOTE — Progress Notes (Signed)
Follow-up Visit   Date:  03/13/2018    Axzel Rockhill Urbas MRN: [id number] DOB: 07/25/1965   Interim History: Wisdom T Brandt is a 53 y.o. right-handed Caucasian male with hypertension, chronic low back pain, and low testosterone and history opioid addiction returning to the clinic for follow-up of hyperCKemia and generalized weakness.  The patient was accompanied to the clinic by self.  History of present illness: Around ~2008, he started having fatigue and his CK levels.  Over the following 5 years, he noticed reduced stamina and worsening fatigue.  He started seeing Dr. Trudie Reed in 2015 who suggested muscle biopsy, but this was declined by patient as he was otherwise asymptomatic.    Around the same time, he began having chronic stabbing pain in the low back.  He has been seeing Dr. Nelva Bush for [facility] which was effective briefly.  Since October 2018, he started having worsening low back pain which would limit his ability to function.  He feels that even if his pain was well-controlled, he still gets fatigued within 10-15 minutes and developed soreness and achy pain of the arms and legs.  He feels loss of muscle mass over the arms in the past 6 months.  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 Recent MRI lumbar spine from February 2019 showed progression of fat atrophy in the paraspinal muscles.   He has noticed greater difficulty opening bottles, climbing stairs, and exertional dyspnea.  He sleeps on one pillow.  He gets cramps in the lower legs.   He denies double vision, difficulty swallowing, speech changes, or dark colored urine.   His parents are both living and healthy.  He has three older brothers, one of which had open heart surgery at the age of 55, and no complications since then.  He has one 80 year old daughter who is healthy.  No family history of neuromuscular disorders.    UPDATE 03/13/2018:  He underwent muscle  biopsy. 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 His surgical scar is healing well.  There has been no significant change with respect to his arm and leg weakness.  He stays active at home with chores such as yard work.  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   UPDATE 08/22/2018:  He has had poor follow-up since his last visit with no-shows and cancellations.  He continues to have generalized weakness, no falls.  No difficulty swallowing/talking.  Sometimes, he has exertional shortness of breath.  He has a lot of low back pain and is frustrated that his providers are not prescribing pain medications.  He was previously seeing Dr. Nelva Bush who has since referred him to pain management.  He is seeking disability.  He underwent MRI cervical spine which showed disc osteophyte complex at C6-7 with mild left hemicord flattening and foraminal stenosis.  At his last visit, I also discussed genetic testing for FSHD and he has decided not to pursue this due to cost.  Medications:  Current Outpatient Medications on File Prior to Visit  Medication Sig Dispense Refill  . acetaminophen (TYLENOL) 325 MG tablet Take 2,000 mg by mouth every 6 (six) hours as needed.    Marland Kitchen amLODipine (NORVASC) 5 MG tablet Take 1 tablet (  5 mg total) by mouth daily. 90 tablet 3  . ibuprofen (ADVIL,MOTRIN) 200 MG tablet Take 800 mg by mouth every 8 (eight) hours as needed (for pain.).    Marland Kitchen naproxen (NAPROSYN) 500 MG tablet Take 1 tablet (500 mg total) by mouth 2 (two) times daily. 30 tablet 0  . testosterone cypionate (DEPO-TESTOSTERONE) 200 MG/ML injection Inject 400 mg into the muscle every 14 (fourteen) days.     . traMADol (ULTRAM) 50 MG tablet Take 1 tablet (50 mg total) by mouth every 8 (eight) hours as needed.  30 tablet 0   No current facility-administered medications on file prior to visit.     Allergies:  Allergies  Allergen Reactions  . Vicodin [Hydrocodone-Acetaminophen] Nausea And Vomiting    Review of Systems:  CONSTITUTIONAL: No fevers, chills, night sweats, or weight loss.  EYES: No visual changes or eye pain ENT: No hearing changes.  No history of nose bleeds.   RESPIRATORY: No cough, wheezing and shortness of breath.   CARDIOVASCULAR: Negative for chest pain, and palpitations.   GI: Negative for abdominal discomfort, blood in stools or black stools.  No recent change in bowel habits.   GU:  No history of incontinence.   MUSCLOSKELETAL: +history of joint pain or swelling.  +myalgias.   SKIN: Negative for lesions, rash, and itching.   ENDOCRINE: Negative for cold or heat intolerance, polydipsia or goiter.   PSYCH:  no depression or anxiety symptoms.   NEURO: As Above.   Vital Signs:  BP 118/88   Pulse 78   Ht _0  (1.803 m)   Wt 199 lb 4 oz (90.4 kg)   SpO2 96%   BMI 27.79 kg/m   General Medical Exam:   General:   Agitated appearing  Eyes/ENT: see cranial nerve examination.   Neck:  Full range of motion without tenderness.  No carotid bruits. Respiratory:  Clear to auscultation, good air entry bilaterally.   Cardiac:  Regular rate and rhythm, no murmur.   Ext:  No edema  Neurological Exam: MENTAL STATUS including orientation to time, place, person, recent and remote memory, attention span and concentration, language, and fund of knowledge is normal.  Speech is not dysarthric.  CRANIAL NERVES: Pupils equal round and reactive to light.  Normal conjugate, extra-ocular eye movements in all directions of gaze.  Mild bilateral ptosis.  There is mild temporal wasting and sunken cheeks.  Face is symmetric. Palate elevates symmetrically.  Tongue is midline.  MOTOR:  Moderate supraspinatus and mild infraspinatus atrophy, worse on the left.  No fasciculations or abnormal  movements.  No pronator drift.  Tone is normal.   Neck flexion and extension is 5-/5.  Right Upper Extremity:    Left Upper Extremity:    Deltoid  5/5   Deltoid  5-/5   Biceps  5/5   Biceps  5-/5   Infraspinatus 4/5  Infraspinatus 4/5  Medial pectoralis 5/5  Medial pectoralis 5/5  Triceps  5/5   Triceps  5-/5   Wrist extensors  5-/5   Wrist extensors  5-/5   Wrist flexors  5-/5   Wrist flexors  5-/5   Finger extensors  5-/5   Finger extensors  5-/5   Finger flexors  5-/5   Finger flexors  5-/5   Dorsal interossei  4+/5   Dorsal interossei  4+/5   Abductor pollicis  4+/5   Abductor pollicis  4+/5   Tone (Ashworth scale)  0  Tone (Ashworth scale)  0  Right Lower Extremity:    Left Lower Extremity:    Hip flexors  5-/5   Hip flexors  5-/5   Hip extensors  5-/5   Hip extensors  5-/5   Knee flexors  5-/5   Knee flexors  5-/5   Knee extensors  5/5   Knee extensors  5/5   Dorsiflexors  4+/5   Dorsiflexors  4+/5   Plantarflexors  4+/5   Plantarflexors  4+/5   Toe extensors  4+/5   Toe extensors  4+/5   Toe flexors  4+/5   Toe flexors  4+/5   Tone (Ashworth scale)  0  Tone (Ashworth scale)  0   MSRs:    Right                                                                 Left brachioradialis 2+  brachioradialis 2+  biceps 3+  biceps 3+  triceps 2+  triceps 2+  patellar 2+  patellar 2+  ankle jerk 2+  ankle jerk 2+   COORDINATION/GAIT:   Gait narrow based and stable.   Data: MRI lumbar spine 08/27/2017: 1.  Mild L2-3 spinal canal stenosis with mild narrowing of subarticular zones, progressed. Mild left neural foraminal narrowing, progressed. 2.  Mild bilateral L4-5 and mild right L5-S1 neural foraminal narrowing, not significantly changed. 3.  Interval progression in fatty replacement and atrophy of erector spinae muscles, mostly along the longissmus muscles. 4.  Degenerative minimal anterolisthesis of L4 on L5.  MRI cervical spine 08/15/2018:    1. Left eccentric disc osteophyte complex at C6-7 with resultant mild flattening of the left hemi cord and moderate left foraminal stenosis. Query left C7 radiculitis. 2. Tiny left paracentral disc protrusion at C5-6 with minimal flattening of the left ventral cord. The ventral left C6 nerve root could potentially be affected. 3. Mild noncompressive disc bulging at C3-4 and C4-5 without stenosis.  Labs 11/28/2017: CK 1923, aldolase 15.6, TSH 1.2, CBC within normal limits, ESR 6, CRP 0.7, anti-RNP 2.6, hepatitis panel negative Labs 03/13/2018: Myostitis panel negative   NCS/EMG of the left side 01/07/3018: 1. 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  2. Probable intraspinal canal lesion at the left L4 nerve root/segment.  3. Left median neuropathy at or distal to the wrist, consistent with clinical diagnosis of carpal tunnel syndrome. Overall, these findings are mild to moderate in degree electrically. 4. Left ulnar neuropathy with slowing across the elbow, purely demyelinating in type and mild in degree electrically.  IMPRESSION/PLAN: Non-necrotizing myopathy affecting the proximal muscles as seen on EMG and biopsy. His exam showing weakness involving the face, periscapular muscles, and distal pattern, makes muscular dystrophy, such as FSHD possible.  Unfortunately genetic testing for [facility] was cost prohibitive.  He is agreeable to undergoing a comprehensive neuromuscular panel of genetic testing through sponsorship program with [facility] which would be free.  Unfortunately, this would not include FSHD1, but will test for a number of the myopathies, muscular dystrophies, and FSHD2. He is also seeking disability for generalized fatigue, chronic low back pain, and weakness.  He will be referred for functional capacity evaluation to guide level of work he can perform. Patient does express frustration with the care provided by this office as  well as his other  providers.  Most of which is stemming from his chronic pain and inability to get buprenorphine.  He will be seeing pain management for this. He was offered a second opinion to an academic center for his muscle weakness, and is agreeable to pursuing this.   Thank you for allowing me to participate in patient's care.  If I can answer any additional questions, I would be pleased to do so.    Sincerely,     K. Posey Pronto, DO

## 2018-08-22 NOTE — Telephone Encounter (Signed)
Called the patient informed of provider comments regarding medication list. He stated he would need to look elsewhere as would need a provider willing to take care of his medical needs and prescriptions. He felt the patient care part of this was not handled well and that all providers need to be willing to fill these types of medications.

## 2018-08-22 NOTE — Patient Instructions (Signed)
Functional capacity evaluation

## 2018-08-26 ENCOUNTER — Other Ambulatory Visit: Payer: Self-pay | Admitting: *Deleted

## 2018-08-26 DIAGNOSIS — G729 Myopathy, unspecified: Secondary | ICD-10-CM

## 2018-08-26 DIAGNOSIS — R748 Abnormal levels of other serum enzymes: Secondary | ICD-10-CM

## 2018-08-26 DIAGNOSIS — M6281 Muscle weakness (generalized): Secondary | ICD-10-CM

## 2018-08-26 DIAGNOSIS — M5412 Radiculopathy, cervical region: Secondary | ICD-10-CM

## 2018-08-26 DIAGNOSIS — R292 Abnormal reflex: Secondary | ICD-10-CM

## 2018-08-26 DIAGNOSIS — M62512 Muscle wasting and atrophy, not elsewhere classified, left shoulder: Secondary | ICD-10-CM

## 2018-08-29 ENCOUNTER — Telehealth: Payer: Self-pay | Admitting: Physical Therapy

## 2018-08-29 ENCOUNTER — Ambulatory Visit: Payer: 59 | Attending: Neurology

## 2018-08-29 ENCOUNTER — Telehealth: Payer: Self-pay | Admitting: *Deleted

## 2018-08-29 NOTE — Telephone Encounter (Signed)
Thank you :)

## 2018-08-29 NOTE — Telephone Encounter (Signed)
No answer, lm for rtc, will try again either today or mon

## 2018-08-29 NOTE — Telephone Encounter (Signed)
Called pt and left message about missed FCE appointment and that he should call to reschedule  for next Wednesday or Friday 7 or 8 AM if he is  able.

## 2018-08-29 NOTE — Telephone Encounter (Signed)
-----   Message from Inez Catalina, MD sent at 08/29/2018  2:44 PM EST ----- Regarding: RE: Kateri Mc - -  Would you mind calling and getting the normal demographic information?   Thank you! ----- Message ----- From: Jackelyn Poling Sent: 08/28/2018   1:43 PM EST To: Inez Catalina, MD, [name], MD, # Subject: OUD                                            Hello,  Please review for new patient scheduling in OUD.    Thanks,  [employer]

## 2018-08-29 NOTE — Telephone Encounter (Signed)
rtc to pt, for OUD, no answer, lm will rtc mon

## 2018-09-01 MED FILL — DEPO-TESTOSTERONE 200 MG/ML: 200 | 84 days supply | Qty: 12 | Fill #2

## 2018-09-04 NOTE — Telephone Encounter (Signed)
Was able to speak w/ pt today Addiction since 2012 Pills at an injury moved on to snorting heroin has been back and forth for several years, several treatment programs Last use 07/08/2018 in [place], went to a treatment ctr there Uses weed at least 2x week in smoking or edibles No family history of addiction, none in home at this time, shares home w/ wife and daughter appt 3/3 at 49, OK?

## 2018-09-04 NOTE — Telephone Encounter (Signed)
Okay.  We will assess him at his appointment for continued treatment.  Thanks!

## 2018-09-09 ENCOUNTER — Ambulatory Visit (INDEPENDENT_AMBULATORY_CARE_PROVIDER_SITE_OTHER): Payer: 59 | Admitting: Internal Medicine

## 2018-09-09 ENCOUNTER — Other Ambulatory Visit: Payer: Self-pay

## 2018-09-09 VITALS — BP 132/89 | HR 87 | Temp 98.6°F | Ht 71.0 in | Wt 202.3 lb

## 2018-09-09 DIAGNOSIS — M6250 Muscle wasting and atrophy, not elsewhere classified, unspecified site: Secondary | ICD-10-CM

## 2018-09-09 DIAGNOSIS — M545 Low back pain: Secondary | ICD-10-CM | POA: Diagnosis not present

## 2018-09-09 DIAGNOSIS — F1120 Opioid dependence, uncomplicated: Secondary | ICD-10-CM

## 2018-09-09 DIAGNOSIS — R748 Abnormal levels of other serum enzymes: Secondary | ICD-10-CM | POA: Diagnosis not present

## 2018-09-09 DIAGNOSIS — G8921 Chronic pain due to trauma: Secondary | ICD-10-CM

## 2018-09-09 MED ORDER — BUPRENORPHINE HCL-NALOXONE HCL 8-2 MG SL FILM: 1.0000 | ORAL_FILM | Freq: Two times a day (BID) | SUBLINGUAL | 0 refills | Status: DC

## 2018-09-09 MED FILL — BUPRENORPHINE HCL-NALOXONE: 8-2 | 14 days supply | Qty: 28 | Fill #0
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
## 2018-09-10 LAB — CMP14 + ANION GAP
ALT: 51 IU/L — AB (ref 0–44)
AST: 36 IU/L (ref 0–40)
Albumin/Globulin Ratio: 2.8 — ABNORMAL HIGH (ref 1.2–2.2)
Albumin: 4.8 g/dL (ref 3.8–4.9)
Alkaline Phosphatase: 71 IU/L (ref 39–117)
Anion Gap: 16 mmol/L (ref 10.0–18.0)
BUN/Creatinine Ratio: 23 — ABNORMAL HIGH (ref 9–20)
BUN: 18 mg/dL (ref 6–24)
Bilirubin Total: 0.5 mg/dL (ref 0.0–1.2)
CALCIUM: 9.5 mg/dL (ref 8.7–10.2)
CO2: 20 mmol/L (ref 20–29)
CREATININE: 0.78 mg/dL (ref 0.76–1.27)
Chloride: 104 mmol/L (ref 96–106)
GFR calc Af Amer: 121 mL/min/{1.73_m2} (ref >59)
GFR, EST NON AFRICAN AMERICAN: 105 mL/min/{1.73_m2} (ref >59)
GLOBULIN, TOTAL: 1.7 g/dL (ref 1.5–4.5)
GLUCOSE: 92 mg/dL (ref 65–99)
Potassium: 4.1 mmol/L (ref 3.5–5.2)
Sodium: 140 mmol/L (ref 134–144)
Total Protein: 6.5 g/dL (ref 6.0–8.5)

## 2018-09-10 LAB — HIV ANTIBODY (ROUTINE TESTING W REFLEX): HIV Screen 4th Generation wRfx: NONREACTIVE

## 2018-09-10 LAB — HEPATITIS C ANTIBODY

## 2018-09-11 DIAGNOSIS — F1190 Opioid use, unspecified, uncomplicated: Secondary | ICD-10-CM | POA: Insufficient documentation

## 2018-09-11 DIAGNOSIS — F1120 Opioid dependence, uncomplicated: Secondary | ICD-10-CM | POA: Insufficient documentation

## 2018-09-11 NOTE — Assessment & Plan Note (Signed)
Patient is an appropriate candidate for buprenorphine for MAT for OUD.  We will obtain records from his previous OUD provider, he completed medication agreement with me today.  Will Rx Suboxone 8-2mg  films, 16mg  total daily dose, as he reportedly did well at this dose. Obtain UDS today. Database reviewed.

## 2018-09-16 LAB — TOXASSURE SELECT,+ANTIDEPR,UR

## 2018-09-22 ENCOUNTER — Telehealth: Payer: Self-pay | Admitting: *Deleted

## 2018-09-22 ENCOUNTER — Telehealth: Payer: Self-pay | Admitting: Internal Medicine

## 2018-09-22 MED ORDER — BUPRENORPHINE HCL-NALOXONE HCL 8-2 MG SL FILM: 1.0000 | ORAL_FILM | Freq: Three times a day (TID) | SUBLINGUAL | 0 refills | Status: DC

## 2018-09-22 NOTE — Telephone Encounter (Signed)
Notified by [facility] of need for PA- buprenorphinehcl-naloxone hci 8/2 film called [phone], spoke w/ rep  Confirmation/case # [id number] to 72 hr turnaround, requested urgent answer.

## 2018-09-22 NOTE — Telephone Encounter (Signed)
Spoke with Joseph Ayala, overall he is doing well with suboxone, no relapse. He is taking one film in the AM one in early afternoon, about 8-9pm however he is getting some withdrawal symptoms. His Utox was appropriate for buprenorphine.   I will increase his suboxone to 8-2mg  TID films for now, will provide 1 month fill, he is to call with any questions, also to call back one week prior to running out of suboxone.

## 2018-09-23 ENCOUNTER — Ambulatory Visit: Payer: 59

## 2018-09-23 MED ORDER — BUPRENORPHINE HCL-NALOXONE HCL 8-2 MG SL FILM: 1.0000 | ORAL_FILM | Freq: Two times a day (BID) | SUBLINGUAL | 0 refills | Status: DC

## 2018-09-23 MED FILL — BUPRENORPHINE HCL-NALOXONE: 8-2 | 30 days supply | Qty: 60 | Fill #0

## 2018-09-23 NOTE — Addendum Note (Signed)
Addended by: Erlinda Hong T on: 09/23/2018 09:16 AM   Modules accepted: Orders

## 2018-09-23 NOTE — Telephone Encounter (Signed)
Ok. I have sent a new one month rx for suboxone bid to [facility].

## 2018-09-23 NOTE — Telephone Encounter (Signed)
Walk-in: pt returns to [facility] to get medication filled.  Per pharmacy-PA required for TID dosing, but insurance will only allow BID dosing w/o PA.  PA was submitted yesterday, but is still pending review.  Pt wishes to change rx to BID dosing at this time.Joseph Spittle Cassady3/17/20208:49 AM

## 2018-10-15 ENCOUNTER — Other Ambulatory Visit: Payer: Self-pay

## 2018-10-15 NOTE — Telephone Encounter (Signed)
Buprenorphine HCl-Naloxone HCl 8-2 MG FILM   Refill request @ [facility].

## 2018-10-15 NOTE — Telephone Encounter (Signed)
Called patient to discuss refill and status given our clinic is not seeing patients right now due to Coronovirus issues.  I asked that he call back tomorrow afternoon and ask for Dr. Criselda Peaches.

## 2018-10-16 ENCOUNTER — Telehealth: Payer: Self-pay | Admitting: *Deleted

## 2018-10-16 MED ORDER — BUPRENORPHINE HCL-NALOXONE HCL 8-2 MG SL FILM: 1.0000 | ORAL_FILM | Freq: Three times a day (TID) | SUBLINGUAL | 0 refills | Status: DC

## 2018-10-16 MED FILL — BUPRENORPHINE HCL-NALOXONE: 8-2 | 30 days supply | Qty: 90 | Fill #0

## 2018-10-16 NOTE — Telephone Encounter (Signed)
[name] called back the clinic.  Identity confirmed with name and birthdate.   He reports that he is doing well.  He is taking his buprenorphine as prescribed.  He had previously discussed with Dr. Mikey Bussing about increasing to TID dosing as he is having withdrawal symptoms in the evening.  He notes that he will take his dose at 6AM and at 1pm and then in the evening he will have upset stomach, sweating, aching.  He has chronic back pain and takes tylenol and ibuprofen. He has previously been getting steroid bursts from his orthopedic doctor as he does not wish to take narcotics anymore.  He is curious about transitioning to our clinic for follow up of his chronic issues.  I advised him to call the clinic to see if a New Patient appointment could be scheduled in the future.  He denies drug use.  He will need a UDS at next visit once our clinic is back on normal hours.    We confirmed today that a prior authorization was completed through his insurance to get Buprenorphine TID.  He is having breakthrough withdrawal symptoms so I think the increase is appropriate.    PDMP reviewed today and is appropriate.    Plan Increase Buprenorphine-naloxone to 8-2mg  TID.    Return to clinic in 1 month, or when we are open with normal hours again.   Debe Coder, MD

## 2018-10-16 NOTE — Telephone Encounter (Signed)
Pt calling back to speak with a nurse. Please call pt back.  

## 2018-10-20 NOTE — Telephone Encounter (Signed)
MD returned call in separate encounter on 10/16/18.Joseph Spittle Cassady4/13/20201:40 PM

## 2018-10-24 ENCOUNTER — Telehealth: Payer: Self-pay | Admitting: Neurology

## 2018-10-24 NOTE — Telephone Encounter (Signed)
I spoke with Joseph Ayala and she is running test [phone].

## 2018-10-24 NOTE — Telephone Encounter (Signed)
Rx called from the Lab and needs Clarification on an order. Please Call. Thanks

## 2018-11-06 ENCOUNTER — Telehealth: Payer: Self-pay | Admitting: Neurology

## 2018-11-06 NOTE — Telephone Encounter (Signed)
[facility] Comprehensive Neuromuscular Panel and FSHD2 - negative.  There is a variant of uncertain significance which is nondiagnostic.   Referral to academic center for 2nd opinion has been initiated.

## 2018-11-07 ENCOUNTER — Encounter: Payer: Self-pay | Admitting: *Deleted

## 2018-11-07 NOTE — Telephone Encounter (Signed)
Results sent via My Chart.  

## 2018-11-17 ENCOUNTER — Other Ambulatory Visit: Payer: Self-pay | Admitting: Internal Medicine

## 2018-11-18 ENCOUNTER — Telehealth: Payer: 59 | Admitting: Neurology

## 2018-11-18 MED FILL — BUPRENORPHINE HCL-NALOXONE: 8-2 | 30 days supply | Qty: 90 | Fill #0

## 2018-11-18 NOTE — Telephone Encounter (Signed)
Spoke w/ pt, sch OUD 6/9 0845 per dr Mikey Bussing

## 2018-11-18 NOTE — Telephone Encounter (Signed)
[employer], he is doing well, father passed recently from natural causes but he has had no relapse, TID dosing is working very well to control cravings, discussed we will start to transition back to in person visits.  I would like to have him for an in person visit for OUD in 1 month.

## 2018-11-20 ENCOUNTER — Telehealth: Payer: Self-pay | Admitting: Neurology

## 2018-11-20 NOTE — Telephone Encounter (Signed)
Patient called stating he was returning your call from [facility] tomorrow. Please call him back. Thanks!

## 2018-11-21 ENCOUNTER — Telehealth: Payer: 59 | Admitting: Neurology

## 2018-11-21 ENCOUNTER — Other Ambulatory Visit: Payer: Self-pay

## 2018-11-21 NOTE — Telephone Encounter (Signed)
Pt r/s by Brandy to 11/26/18.  I spoke with pt and his wife. Both of them believed that his appt was today at 9am per a MY Chart message.  I instructed that our schedule said 10 and I apologized for the confusion.  Pt had to reschedule because his father has passed and he has a Clinical research associate appt at 10 today.  Made Dr. Allena Katz aware of this.

## 2018-11-24 MED FILL — TESTOSTERONE CYPIONATE 200: 200 | 28 days supply | Qty: 4 | Fill #0

## 2018-11-26 ENCOUNTER — Telehealth: Payer: 59 | Admitting: Neurology

## 2018-11-28 ENCOUNTER — Ambulatory Visit: Payer: 59 | Admitting: Emergency Medicine

## 2018-12-05 DIAGNOSIS — Z1211 Encounter for screening for malignant neoplasm of colon: Secondary | ICD-10-CM | POA: Diagnosis not present

## 2018-12-05 DIAGNOSIS — N200 Calculus of kidney: Secondary | ICD-10-CM | POA: Diagnosis not present

## 2018-12-05 DIAGNOSIS — I10 Essential (primary) hypertension: Secondary | ICD-10-CM | POA: Diagnosis not present

## 2018-12-05 DIAGNOSIS — M609 Myositis, unspecified: Secondary | ICD-10-CM | POA: Diagnosis not present

## 2018-12-05 DIAGNOSIS — I83813 Varicose veins of bilateral lower extremities with pain: Secondary | ICD-10-CM | POA: Diagnosis not present

## 2018-12-12 ENCOUNTER — Telehealth (INDEPENDENT_AMBULATORY_CARE_PROVIDER_SITE_OTHER): Payer: 59 | Admitting: Neurology

## 2018-12-12 ENCOUNTER — Other Ambulatory Visit: Payer: Self-pay

## 2018-12-12 DIAGNOSIS — R748 Abnormal levels of other serum enzymes: Secondary | ICD-10-CM

## 2018-12-12 DIAGNOSIS — G729 Myopathy, unspecified: Secondary | ICD-10-CM

## 2018-12-12 DIAGNOSIS — M6281 Muscle weakness (generalized): Secondary | ICD-10-CM

## 2018-12-12 DIAGNOSIS — M791 Myalgia, unspecified site: Secondary | ICD-10-CM

## 2018-12-12 MED ORDER — MELOXICAM 7.5 MG PO TABS: 7.5000 mg | ORAL_TABLET | Freq: Every day | ORAL | 3 refills | Status: DC | PRN

## 2018-12-12 MED FILL — MELOXICAM 7.5 MG TABLET: 7.5 | 20 days supply | Qty: 20 | Fill #0

## 2018-12-12 NOTE — Progress Notes (Signed)
Virtual Visit via Video Note The purpose of this virtual visit is to provide medical care while limiting exposure to the novel coronavirus.    Consent was obtained for video visit:  Yes.   Answered questions that patient had about telehealth interaction:  Yes.   I discussed the limitations, risks, security and privacy concerns of performing an evaluation and management service by telemedicine. I also discussed with the patient that there may be a patient responsible charge related to this service. The patient expressed understanding and agreed to proceed.  Pt location: Home Physician Location: office Name of referring provider:  Horald Pollen, * I connected with Joseph Ayala at patients initiation/request on 12/12/2018 at  1:30 PM EDT by video enabled telemedicine application and verified that I am speaking with the correct person using two identifiers. Pt MRN:  [id number] Pt DOB:  1966-03-18 Video Participants:  Joseph Ayala   History of Present Illness: This is a 53 y.o. male returning for follow-up of myopathy and new complaints of muscle soreness and pain. He continues to have generalized fatigue and ongoing muscle weakness in the arms and legs.  Over the past 3 weeks, he has noticed increased soreness and burning sensation of the muscles in the arms and legs, which is triggered by exertion.  Symptoms quickly resolve with rest within 30-min.  He is still independent and able to raise his arms up, walks, squat, and no problems with speech/swallow or shortness of breath.  He denies stiffness or difficulty with relaxing the muscles.  Occasionally, he gets leg cramps.   He saw his PCP recently who check CK which was 1750.   He feels that his trunk is getting weaker because he is hunched more often.   He has lost 20lb and currently at 190lb.  Observations/Objective:   Patient is awake, alert, and appears comfortable.  Oriented x 4.   Extraocular muscles are intact. Mild bilateral  ptosis.  Face is symmetric.  Mild temporal wasting and sunken cheeks.  Speech is not dysarthric.  Antigravity in all extremities.  No pronator drift. Gait appears normal .  Labs 11/28/2017: CK 1923, aldolase 15.6, TSH 1.2, CBC within normal limits, ESR 6, CRP 0.7, anti-RNP 2.6, hepatitis panel negative Labs 03/13/2018: Myostitis panel negative   Assessment and Plan:  1. 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  Inflammatory markers and myositis panel is negative.  Comprehensive neuromuscular panel (110 genes) + FSDH2 through [facility] for hereditary muscular dystrophy is negative.  Testing panel, unfortunately, does not included FSHD1, myotonic dystrophy, or antibody testing for inclusion body myositis, which was previously not performed due to cost.  He is agreeable to proceed with genetic testing, and I will check for DMPK gene expansion for muscular dystrophy type 1.  If negative, check for myotonic dystrophy 2.  With his normal inflammatory markers, lack of prominent inflammation on biopsy, and no exposure to statin therapy, the likelihood of autoimmune myopathy is low.  He has also been referred to Neuromuscular at [facility] for second opinion, which is pending.   2.  Exertional myalgias.  Start Mobic 7.16m daily as needed.  Limit use with other NSAIDs  3.  Cervical spondylosis with C6-7 disc protrusion on the left hemicord, clinically stable.  Follow Up Instructions:   I discussed the assessment and treatment plan with the patient. The patient  was provided an opportunity to ask questions and all were answered. The patient agreed with the plan and demonstrated an understanding of the instructions.   The patient was advised to call back or seek an in-person evaluation if the symptoms worsen or if the condition fails to  improve as anticipated.  Follow-up in 3 months  Total time spent:  30 minutes     Alda Berthold, DO

## 2018-12-15 MED FILL — predniSONE 20 MG TABS: 20 | 16 days supply | Qty: 25 | Fill #0

## 2018-12-16 ENCOUNTER — Ambulatory Visit (INDEPENDENT_AMBULATORY_CARE_PROVIDER_SITE_OTHER): Payer: 59 | Admitting: Internal Medicine

## 2018-12-16 ENCOUNTER — Telehealth: Payer: Self-pay | Admitting: Neurology

## 2018-12-16 ENCOUNTER — Other Ambulatory Visit: Payer: Self-pay

## 2018-12-16 VITALS — BP 114/69 | HR 70 | Temp 98.1°F | Ht 71.0 in | Wt 197.9 lb

## 2018-12-16 DIAGNOSIS — F1120 Opioid dependence, uncomplicated: Secondary | ICD-10-CM

## 2018-12-16 MED ORDER — BUPRENORPHINE HCL-NALOXONE HCL 8-2 MG SL FILM: ORAL_FILM | SUBLINGUAL | 0 refills | Status: DC

## 2018-12-16 MED FILL — BUPRENORPHINE HCL-NALOXONE: 8-2 | 30 days supply | Qty: 90 | Fill #0

## 2018-12-16 NOTE — Addendum Note (Signed)
Addended by: Lalla Brothers T on: 12/16/2018 02:21 PM   Modules accepted: Level of Service

## 2018-12-16 NOTE — Patient Instructions (Addendum)
Mr. Cisek,   Continue taking Suboxone 8-2 mg three times a day. Follow up with [place] in 4 weeks. Call us with any questions or concerns.   - Dr. Frederico Hamman

## 2018-12-16 NOTE — Telephone Encounter (Signed)
Patient is calling in about DX- mild dystrophy; needs this in writing with letterhead about DX for disability court. Patient will come pick up in the office when ready. Thanks!

## 2018-12-16 NOTE — Assessment & Plan Note (Signed)
Patient is doing well on Suboxone 8-2 mg TID, 24 mg total daily dosing. Last UDS and database reviewed. Both are appropriate.  - Continue Suboxone 8-2 mg TID - Follow up UDS - Follow up in 4 weeks

## 2018-12-16 NOTE — Progress Notes (Signed)
Internal Medicine Clinic Attending  I saw and evaluated the patient.  I personally confirmed the key portions of the history and exam documented by Dr. Isac Sarna and I reviewed pertinent patient test results.  The assessment, diagnosis, and plan were formulated together and I agree with the documentation in the resident's note.  OUD doing well, good control with TID regimen of suboxone. Will continue indefinitely. Follow up in one month. Tox screen today.

## 2018-12-16 NOTE — Progress Notes (Signed)
   12/16/2018  Joseph Ayala presents for follow up of opioid use disorder I have reviewed the prior induction visit, follow up visits, and telephone encounters relevant to opiate use disorder (OUD) treatment.   Current daily dose: Suboxone 8-2 mg TID   Date of Induction: 09/09/2018  Current follow up interval, in weeks: 4 weeks   The patient has been adherent with the buprenorphine for OUD contract.   Last UDS Result: 09/09/18, appropriate   HPI: Joseph Ayala is a 53 yo male with history of degenerative lumbar disease and necrotizing myopathy who presents for follow up of opioid use disorder. He is doing well today and reports no issues other than his chronic lower back pain. Denies symptoms of withdrawal and cravings on current dose. Denies relapses. He follows up with neurology and ortho and was recently started on Mobic 7 mg QD and prednisone 20 which have help with the pain. He is being worked up for muscular dystrophy.   Exam:   Vitals:   12/16/18 0952 12/16/18 0958  BP:  114/69  Pulse:  70  Temp:  98.1 F (36.7 C)  TempSrc:  Oral  SpO2:  99%  Weight: 197 lb 14.4 oz (89.8 kg)   Height: 5\' 11"  (1.803 m)    General: well-appearing male in no acute distress CV: RRR, no mrg  Pulm: CTAB, no increased work of breathing  Ext: warm and well perfused without edema   Assessment/Plan:  See Problem Based Charting in the Encounters Tab    Welford Roche, MD  12/16/2018  10:26 AM

## 2018-12-17 ENCOUNTER — Other Ambulatory Visit: Payer: Self-pay

## 2018-12-17 DIAGNOSIS — G729 Myopathy, unspecified: Secondary | ICD-10-CM

## 2018-12-17 DIAGNOSIS — M62512 Muscle wasting and atrophy, not elsewhere classified, left shoulder: Secondary | ICD-10-CM

## 2018-12-17 DIAGNOSIS — M6281 Muscle weakness (generalized): Secondary | ICD-10-CM

## 2018-12-17 DIAGNOSIS — M5412 Radiculopathy, cervical region: Secondary | ICD-10-CM

## 2018-12-18 NOTE — Telephone Encounter (Signed)
Patient is calling in about lawyer paperwork. Wanting to know if we received those through the fax. He is needing them by Tomorrow at [facility]. Please call him back with an update on the paperwork at [phone]. Thanks!

## 2018-12-18 NOTE — Telephone Encounter (Signed)
Pt given the number to medical records. Per Dr. Posey Pronto this will be the best way to show pts diagnosis.  If pts lawyer is needing further documentation the lawyers office will need to fax or mail paperwork that needs to be completed. Per Dr. Posey Pronto there will be charges for this a pt was made aware of this. I informed him that I was not sure of the amount but it may be around $25.  In the meantime pt is concerned about his [facility] referral. He has not heard anything. Dr. Posey Pronto wanted pt to see Neuromuscular physician.  [facility] [phone]  Their office did receive the referral but never called pt to schedule. Receptionist will get pts info to NP coordinator and will contact the pt ASAP. She states that an appt should be scheduled within a week or two.

## 2018-12-18 NOTE — Telephone Encounter (Signed)
Pt called again regarding form for lawyer, he reiterated that form needs to be filled out by a MD. He is asking for a call back tomorrow morning.

## 2018-12-19 LAB — TOXASSURE SELECT,+ANTIDEPR,UR

## 2018-12-19 NOTE — Telephone Encounter (Signed)
Pt informed that we did receive paperwork yesterday afternoon.  Dr. Posey Pronto is not in the office today to complete them.  Informed him that she will complete them ASAP.  Pt states that he has a consultation scheduled with [facility] June 18th (Virtual Visit)

## 2018-12-22 ENCOUNTER — Other Ambulatory Visit: Payer: Self-pay

## 2018-12-22 ENCOUNTER — Telehealth: Payer: Self-pay | Admitting: Neurology

## 2018-12-22 DIAGNOSIS — G729 Myopathy, unspecified: Secondary | ICD-10-CM

## 2018-12-22 DIAGNOSIS — R292 Abnormal reflex: Secondary | ICD-10-CM

## 2018-12-22 DIAGNOSIS — M791 Myalgia, unspecified site: Secondary | ICD-10-CM

## 2018-12-22 DIAGNOSIS — M6281 Muscle weakness (generalized): Secondary | ICD-10-CM

## 2018-12-22 MED FILL — TESTOSTERONE CYP 200 MG/ML: 200 | 84 days supply | Qty: 12 | Fill #0

## 2018-12-22 NOTE — Telephone Encounter (Signed)
Paperwork has not been completed.  Pt needs a new referral placed for Function capacity.  His wife was informed of this.  Pt already had an appt scheduled for this 08/29/18 and pt noshowed for it.  I will call [facility] and schedule an appt [phone].  Order needs to be Amb to Rehab. In comments put FCT

## 2018-12-22 NOTE — Telephone Encounter (Signed)
Patient calling in left VM about paperwork for court. Please call him back with update. Thanks!

## 2018-12-23 NOTE — Telephone Encounter (Signed)
Spoke with [employer] at [employer] with Zacarias Pontes. Their number is [phone].  The referral that was placed in Feb 2020 is good until Nov this year.  Ubaldo Glassing was able to see the same referral and will give it to the referral coordinator.  They are booking at the end of July. Pt will be informed by their office of an appt date and time.  Pt informed.

## 2018-12-25 ENCOUNTER — Ambulatory Visit: Payer: 59 | Admitting: Occupational Therapy

## 2018-12-25 DIAGNOSIS — G729 Myopathy, unspecified: Secondary | ICD-10-CM | POA: Diagnosis not present

## 2018-12-25 DIAGNOSIS — M6281 Muscle weakness (generalized): Secondary | ICD-10-CM | POA: Diagnosis not present

## 2019-01-03 DIAGNOSIS — L089 Local infection of the skin and subcutaneous tissue, unspecified: Secondary | ICD-10-CM | POA: Diagnosis not present

## 2019-01-03 DIAGNOSIS — Z6826 Body mass index (BMI) 26.0-26.9, adult: Secondary | ICD-10-CM | POA: Diagnosis not present

## 2019-01-05 ENCOUNTER — Ambulatory Visit: Payer: 59 | Attending: Neurology | Admitting: Occupational Therapy

## 2019-01-05 ENCOUNTER — Other Ambulatory Visit: Payer: Self-pay

## 2019-01-05 DIAGNOSIS — R52 Pain, unspecified: Secondary | ICD-10-CM | POA: Diagnosis not present

## 2019-01-05 DIAGNOSIS — G8929 Other chronic pain: Secondary | ICD-10-CM | POA: Diagnosis not present

## 2019-01-05 DIAGNOSIS — R2681 Unsteadiness on feet: Secondary | ICD-10-CM | POA: Insufficient documentation

## 2019-01-05 DIAGNOSIS — M6281 Muscle weakness (generalized): Secondary | ICD-10-CM | POA: Diagnosis not present

## 2019-01-05 DIAGNOSIS — R278 Other lack of coordination: Secondary | ICD-10-CM | POA: Insufficient documentation

## 2019-01-05 NOTE — Therapy (Signed)
and let therapist know)   OT Treatment/Interventions  Self-care/ADL training;Therapeutic exercise;Functional Mobility Training;Manual Therapy;Neuromuscular education;Aquatic Therapy;Therapeutic activities;Energy conservation;Coping strategies training;DME and/or AE instruction;Cognitive remediation/compensation;Moist Heat;Passive range of motion;Patient/family education    Plan  HEP, energy conservation techniques    Consulted and Agree with Plan of Care  Patient       Patient will benefit from skilled therapeutic intervention in order to improve the following deficits and impairments:   Body Structure / Function / Physical Skills: ADL, IADL, Balance, Sensation, Endurance, Muscle spasms, Strength, FMC, Coordination, UE functional use, Pain, Decreased knowledge of use of DME       Visit Diagnosis: 1. Generalized muscle weakness   2. Other lack of coordination   3. Unsteadiness on feet   4. Chronic generalized pain       Problem List Patient Active Problem List   Diagnosis Date Noted  . Moderate opioid use disorder (HCC) 09/11/2018  . Addiction (HCC) 05/28/2018  . HyperCKemia 12/27/2017  . Generalized muscle weakness 12/27/2017  . Chronic pain syndrome 10/22/2017   . Degeneration of lumbar intervertebral disc 09/25/2017  . Lumbar spondylosis 09/25/2017  . Varicose veins of leg with edema 12/29/2015  . LIVER FUNCTION TESTS, ABNORMAL 06/10/2007    Kelli ChurnBallie, Daijon Wenke Johnson, OTR/L 01/05/2019, 1:59 PM  [facility] [address] [place], [place], [address] Phone: [phone]   Fax:  [phone]  Name: Joseph Ayala MRN: [id number] Date of Birth: 1965/10/22  and let therapist know)   OT Treatment/Interventions  Self-care/ADL training;Therapeutic exercise;Functional Mobility Training;Manual Therapy;Neuromuscular education;Aquatic Therapy;Therapeutic activities;Energy conservation;Coping strategies training;DME and/or AE instruction;Cognitive remediation/compensation;Moist Heat;Passive range of motion;Patient/family education    Plan  HEP, energy conservation techniques    Consulted and Agree with Plan of Care  Patient       Patient will benefit from skilled therapeutic intervention in order to improve the following deficits and impairments:   Body Structure / Function / Physical Skills: ADL, IADL, Balance, Sensation, Endurance, Muscle spasms, Strength, FMC, Coordination, UE functional use, Pain, Decreased knowledge of use of DME       Visit Diagnosis: 1. Generalized muscle weakness   2. Other lack of coordination   3. Unsteadiness on feet   4. Chronic generalized pain       Problem List Patient Active Problem List   Diagnosis Date Noted  . Moderate opioid use disorder (HCC) 09/11/2018  . Addiction (HCC) 05/28/2018  . HyperCKemia 12/27/2017  . Generalized muscle weakness 12/27/2017  . Chronic pain syndrome 10/22/2017   . Degeneration of lumbar intervertebral disc 09/25/2017  . Lumbar spondylosis 09/25/2017  . Varicose veins of leg with edema 12/29/2015  . LIVER FUNCTION TESTS, ABNORMAL 06/10/2007    Kelli ChurnBallie, Daijon Wenke Johnson, OTR/L 01/05/2019, 1:59 PM  [facility] [address] [place], [place], [address] Phone: [phone]   Fax:  [phone]  Name: Joseph Ayala MRN: [id number] Date of Birth: 1965/10/22  [place] [address] [place], [place], [address] Phone: [phone]   Fax:  [phone]  Occupational Therapy Evaluation  Patient Details  Name: Joseph Ayala MRN: [id number] Date of Birth: Nov 24, 1965 Referring Provider (OT): Dr. Narda Amber   Encounter Date: 01/05/2019  OT End of Session - 01/05/19 1251    Visit Number  1    Number of Visits  8    Date for OT Re-Evaluation  03/07/19    Authorization Type  [facility]    OT Start Time  1120    OT Stop Time  1200    OT Time Calculation (min)  40 min    Activity Tolerance  Patient tolerated treatment well;Patient limited by pain    Behavior During Therapy  Agitated       Past Medical History:  Diagnosis Date  . Degenerative disc disease, lumbar   . Dental crown present   . History of kidney stones   . Hypertension    states under control with meds., has been on med. x 1 yr. - is currently out of med., to see PCP 02/06/2018  . Muscle atrophy 01/2018  . Muscle weakness 01/2018    Past Surgical History:  Procedure Laterality Date  . ENDOVENOUS ABLATION SAPHENOUS VEIN W/ LASER Right 12-29-2015   endovenous laser ablation right greater saphenous vein, stab phlebectomy > 20 incisions right leg, sclerotherapy right leg by Curt Jews MD    . MINOR MUSCLE BIOPSY Right 02/11/2018   Procedure: RECTUS FEMORIS MUSCLE BIOPSY;  Surgeon: Armandina Gemma, MD;  Location: [place] [facility];  Service: General;  Laterality: Right;  . MUSCLE BIOPSY Right 02/11/2018   Procedure: DELTOID MUSCLE BIOPSY;  Surgeon: Armandina Gemma, MD;  Location: [place];  Service: General;  Laterality: Right;  . NASAL FRACTURE SURGERY      There were no vitals filed for this visit.  Subjective Assessment - 01/05/19 1119    Subjective   I am stiff and achy    Pertinent History  PMH: HTN, Lt CTS, cervical stenosis C6-7, opioid    Limitations  fatigues quickly    Currently in Pain?  Yes     Pain Score  8     Pain Location  Generalized    Pain Orientation  Right;Left    Pain Descriptors / Indicators  Burning;Sharp;Throbbing    Pain Type  Chronic pain    Pain Radiating Towards  lower back down both legs, pt also has intermittent pain in BUE's at forearm    Pain Onset  More than a month ago    Pain Frequency  Constant    Pain Relieving Factors  prednisone    Effect of Pain on Daily Activities  movement, lifting        [facility] OT Assessment - 01/05/19 0001      Assessment   Medical Diagnosis  generalized muscle weakness, myopathy, hyperreflexia, myalgia   referring MD suspecting Muscular dystrophy   Referring Provider (OT)  Dr. Narda Amber    Onset Date/Surgical Date  --   since 2018, but worse the last 6 months   Hand Dominance  Right    Prior Therapy  none      Precautions   Precaution Comments  h/o opioid abuse      Restrictions   Weight Bearing Restrictions  No      Balance Screen   Has the patient fallen in the past 6 months  Yes  and let therapist know)   OT Treatment/Interventions  Self-care/ADL training;Therapeutic exercise;Functional Mobility Training;Manual Therapy;Neuromuscular education;Aquatic Therapy;Therapeutic activities;Energy conservation;Coping strategies training;DME and/or AE instruction;Cognitive remediation/compensation;Moist Heat;Passive range of motion;Patient/family education    Plan  HEP, energy conservation techniques    Consulted and Agree with Plan of Care  Patient       Patient will benefit from skilled therapeutic intervention in order to improve the following deficits and impairments:   Body Structure / Function / Physical Skills: ADL, IADL, Balance, Sensation, Endurance, Muscle spasms, Strength, FMC, Coordination, UE functional use, Pain, Decreased knowledge of use of DME       Visit Diagnosis: 1. Generalized muscle weakness   2. Other lack of coordination   3. Unsteadiness on feet   4. Chronic generalized pain       Problem List Patient Active Problem List   Diagnosis Date Noted  . Moderate opioid use disorder (HCC) 09/11/2018  . Addiction (HCC) 05/28/2018  . HyperCKemia 12/27/2017  . Generalized muscle weakness 12/27/2017  . Chronic pain syndrome 10/22/2017   . Degeneration of lumbar intervertebral disc 09/25/2017  . Lumbar spondylosis 09/25/2017  . Varicose veins of leg with edema 12/29/2015  . LIVER FUNCTION TESTS, ABNORMAL 06/10/2007    Kelli ChurnBallie, Daijon Wenke Johnson, OTR/L 01/05/2019, 1:59 PM  [facility] [address] [place], [place], [address] Phone: [phone]   Fax:  [phone]  Name: Joseph Ayala MRN: [id number] Date of Birth: 1965/10/22

## 2019-01-06 MED FILL — METHYLPREDNISOLONE 4 MG TAB: 4 | 6 days supply | Qty: 21 | Fill #0

## 2019-01-13 ENCOUNTER — Other Ambulatory Visit: Payer: Self-pay

## 2019-01-13 ENCOUNTER — Ambulatory Visit (INDEPENDENT_AMBULATORY_CARE_PROVIDER_SITE_OTHER): Payer: 59 | Admitting: Student in an Organized Health Care Education/Training Program

## 2019-01-13 VITALS — BP 134/98 | HR 84 | Ht 71.0 in | Wt 191.7 lb

## 2019-01-13 DIAGNOSIS — G8929 Other chronic pain: Secondary | ICD-10-CM

## 2019-01-13 DIAGNOSIS — F1120 Opioid dependence, uncomplicated: Secondary | ICD-10-CM | POA: Diagnosis not present

## 2019-01-13 DIAGNOSIS — R748 Abnormal levels of other serum enzymes: Secondary | ICD-10-CM

## 2019-01-13 DIAGNOSIS — M545 Low back pain: Secondary | ICD-10-CM | POA: Diagnosis not present

## 2019-01-13 MED ORDER — BUPRENORPHINE HCL-NALOXONE HCL 8-2 MG SL FILM: ORAL_FILM | SUBLINGUAL | 0 refills | Status: DC

## 2019-01-13 MED ORDER — PREDNISONE 20 MG PO TABS: 20.0000 mg | ORAL_TABLET | Freq: Every day | ORAL | 0 refills | Status: DC

## 2019-01-13 MED FILL — predniSONE 20 MG TABS: 20 | 10 days supply | Qty: 10 | Fill #0

## 2019-01-13 MED FILL — BUPRENORPHINE HCL-NALOXONE: 8-2 | 29 days supply | Qty: 65 | Fill #0

## 2019-01-13 NOTE — Assessment & Plan Note (Signed)
Patient is at risk for some sort of myositis or inflammatory myopathy.  Currently undergoing evaluation with [facility] neurology, has seen Dr. Posey Pronto here at [facility].  Plan for EMG and in person evaluation in the coming months.  Talked about his positive response to burst of prednisone in the past.  He finds these greatly helpful in improving of his functional capacity.  I think his chronic pain also likely is contributing to his opioid use disorder.  Given the moderate flare of pain is having right now I prescribed prednisone 20 mg to be taken daily for 10 days.  Asked him to follow-up with the neurologist.  We talked about how prednisone is not a long-term solution and if this response to immunosuppressive's there are other options that would be better for daily use.  He understands, will follow up with neurology and his primary care physician.

## 2019-01-13 NOTE — Progress Notes (Signed)
   01/13/2019  Joseph Ayala presents for follow up of opioid use disorder I have reviewed the prior induction visit, follow up visits, and telephone encounters relevant to opiate use disorder (OUD) treatment.   Current daily dose: Suboxone 24 mg daily in divided doses  Date of Induction: 09/09/2018  Current follow up interval, in weeks: 4  The patient has been adherent with the buprenorphine for OUD contract.   Last UDS Result: Appropriate for Suboxone and metabolite  HPI: 53 year old person here for follow-up of moderate opioid use disorder.  Patient has a history of addiction to oral opioid analgesics since 2012 related to chronic pain generator in his lower back.  We have been treating him with Suboxone since March 2020, he is doing very well.  Reports cravings are well controlled.  Denies any drug use.  Denies any withdrawal symptoms, though he is a little afraid of withdrawal coming on if he reduces the dose of Suboxone.  Feels like his chronic pain generator in his lower back is a little worse over the last few weeks.  Also feels more weakness than usual.  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  No recent fevers or chills.  Exam:   Vitals:   01/13/19 1004  BP: (!) 134/98  Pulse: 84  SpO2: 100%  Weight: 191 lb 11.2 oz (87 kg)  Height: 5\' 11"  (1.803 m)    General: Well-appearing man, no distress Heart regular rate and rhythm with no murmurs Lungs: Unlabored, clear throughout Neuro: Alert, conversational, full strength, normal gait, normal muscle bulk Psych: Normal mood, not depressed or anxious appearing  Assessment/Plan:  See Problem Based Charting in the Encounters Tab     [name], MD  01/13/2019  10:37 AM

## 2019-01-13 NOTE — Assessment & Plan Note (Signed)
Moderate opioid use disorder currently well controlled on Suboxone.  Previously addicted to Percocet, started in response to a chronic pain generator of his lower back.  Plan is to continue with Suboxone but decrease his dosage from 3 films daily to 2.5 films for 2 weeks then 2 films daily for 2 weeks.  We will see how his cravings do on this regimen.  Urine toxicology screen last month was appropriate.  I reviewed the database which was appropriate.Follow-up with [place] in 4 weeks.

## 2019-01-13 NOTE — Patient Instructions (Signed)
Today we talked about opioid use disorder.  You are doing well.  We decided to decrease Suboxone to 2.5 films for 2 weeks.  Then take 2 films daily for 2 weeks.  You can divide those doses throughout the day however feels fast.  We talked about your muscle inflammation.  I prescribed prednisone 20 mg to be taken once daily for 10 days.  Follow-up with the [facility] neurologist about next steps in testing.  Follow-up with [place] for the Suboxone in 4 weeks.

## 2019-01-14 ENCOUNTER — Ambulatory Visit: Payer: 59

## 2019-01-22 ENCOUNTER — Ambulatory Visit: Payer: 59 | Attending: Neurology | Admitting: Occupational Therapy

## 2019-01-27 ENCOUNTER — Other Ambulatory Visit: Payer: Self-pay | Admitting: Student in an Organized Health Care Education/Training Program

## 2019-01-30 ENCOUNTER — Ambulatory Visit: Payer: 59 | Admitting: Occupational Therapy

## 2019-02-05 ENCOUNTER — Ambulatory Visit: Payer: 59 | Admitting: Occupational Therapy

## 2019-02-09 ENCOUNTER — Other Ambulatory Visit: Payer: Self-pay

## 2019-02-09 ENCOUNTER — Other Ambulatory Visit: Payer: Self-pay | Admitting: Student in an Organized Health Care Education/Training Program

## 2019-02-09 NOTE — Telephone Encounter (Signed)
Buprenorphine HCl-Naloxone HCl 8-2 MG FILM   Refill request @  [place], [place] - [address]. [phone] (Phone) [phone] (Fax)

## 2019-02-10 MED ORDER — BUPRENORPHINE HCL-NALOXONE HCL 8-2 MG SL FILM: 2.0000 | ORAL_FILM | Freq: Every day | SUBLINGUAL | 0 refills | Status: DC

## 2019-02-10 MED FILL — BUPRENORPHINE HCL-NALOXONE: 8-2 | 14 days supply | Qty: 28 | Fill #0

## 2019-02-10 NOTE — Telephone Encounter (Signed)
Call to patient-HIPPA compliant message left on recorder for call back regarding refill request and an appt.Joseph Ayala, Joseph Cassady8/4/20209:17 AM

## 2019-02-10 NOTE — Telephone Encounter (Signed)
Received return call from patient-made him aware of refill-pt also scheduled for OUD Telehealth visit on 02/24/19

## 2019-02-10 NOTE — Telephone Encounter (Signed)
He was supposed to follow up with OUD clinic today, 4 weeks after last visit. He did not make an appointment, not sure but probably a miscommunication. Database looks good. I will refill a 2 week supply. Should arrange for follow up OUD visit in 2 weeks. Can be a televisit if patient prefers that.

## 2019-02-11 ENCOUNTER — Ambulatory Visit: Payer: 59 | Attending: Neurology | Admitting: Occupational Therapy

## 2019-02-24 ENCOUNTER — Other Ambulatory Visit: Payer: Self-pay

## 2019-02-24 ENCOUNTER — Ambulatory Visit (INDEPENDENT_AMBULATORY_CARE_PROVIDER_SITE_OTHER): Payer: 59 | Admitting: Internal Medicine

## 2019-02-24 ENCOUNTER — Telehealth: Payer: Self-pay | Admitting: *Deleted

## 2019-02-24 DIAGNOSIS — F1123 Opioid dependence with withdrawal: Secondary | ICD-10-CM

## 2019-02-24 DIAGNOSIS — M545 Low back pain: Secondary | ICD-10-CM | POA: Diagnosis not present

## 2019-02-24 DIAGNOSIS — M47816 Spondylosis without myelopathy or radiculopathy, lumbar region: Secondary | ICD-10-CM

## 2019-02-24 DIAGNOSIS — R748 Abnormal levels of other serum enzymes: Secondary | ICD-10-CM | POA: Diagnosis not present

## 2019-02-24 DIAGNOSIS — F1120 Opioid dependence, uncomplicated: Secondary | ICD-10-CM

## 2019-02-24 DIAGNOSIS — G8929 Other chronic pain: Secondary | ICD-10-CM | POA: Diagnosis not present

## 2019-02-24 MED ORDER — PREDNISONE 20 MG PO TABS: 20.0000 mg | ORAL_TABLET | Freq: Every day | ORAL | 0 refills | Status: DC

## 2019-02-24 MED ORDER — BUPRENORPHINE HCL-NALOXONE HCL 8-2 MG SL FILM: ORAL_FILM | SUBLINGUAL | 0 refills | Status: DC

## 2019-02-24 MED FILL — BUPRENORPHINE HCL-NALOXONE: 8-2 | 30 days supply | Qty: 75 | Fill #0

## 2019-02-24 MED FILL — predniSONE 20 MG TABS: 20 | 5 days supply | Qty: 5 | Fill #0

## 2019-02-24 NOTE — Assessment & Plan Note (Signed)
Currently experience moderate withdrawal symptoms based on telephone evaluation after titrating down to 2 films daily over the last 4 weeks. He mentions no relapse episodes but using heavy doses of Tylenol and ibuprofen for supportive care. Denies any significant cravings. Discussed returning to 2.5 film dose and follow up in 4 weeks. Joseph Ayala expressed understanding.  - Suboxone 2.5 Films daily (20mg  daily) - F/u in 4 weeks

## 2019-02-24 NOTE — Telephone Encounter (Signed)
Benjamine Mola, pharmacist at [facility] calls to state that pt in the last week has ask a lot of odd questions, last week he approached her about how he can get more prednisone, could he get it from multiple providers and would they know each was prescribing, how quick he could get it, if it was a pain medicine and that his pcp had told him he would not prescribe anymore for him. She states he ask lots of questions and it made her think what is going on? Today he ask in depth questions about suboxone, if it is pain medicine and amts and other questions that caused her to think about these conversations more and just felt that the oud doctors should know.

## 2019-02-24 NOTE — Assessment & Plan Note (Signed)
Currently undergoing evaluation for inflammatory vs degenerative myopathy. He has a follow up appointment in 1 week for EMG with neurology and is expected to get further labs done at that time as well. He mentions taking additional doses of Tylenol and ibuprofen for supportive care for his chronic low back pain which is presumed to be associated with his muscular disease. Will prescribe short course of prednisone for symptom relief until he had a chance to see his neurologist.  - F/u with neurology - Prednisone 20mg  for 5 days

## 2019-02-24 NOTE — Progress Notes (Signed)
  [facility] Telephone Encounter OUD clinic Appointment  HPI:   This telephone encounter was created for [name] on 02/24/2019 for the following purpose/cc opioid use disorder. Joseph Ayala presents for follow up of opioid use disorder I have reviewed the prior induction visit, follow up visits, and telephone encounters relevant to opiate use disorder (OUD) treatment.  Current daily dose: Suboxone 16mg  daily in 2 separate doses  Date of Induction: 09/09/18  Current follow up interval, in weeks: 4  The patient has been adherent with the buprenorphine for OUD contract.   Last UDS Result: Buprenorphine + amphetamines + alcohol + THC   Joseph Ayala was evaluated vis telephone encounter. He reports continuing to endorse significant exacerbation of his chronic lower back pain. He mentions that he has been managing the pain with 6000mg  of Tylenol and 1200mg  of Ibuprofen but has not relapsed and have not used any other opioid medications besides his Suboxone. He mentions improvement after short course of prednisone which was prescribed last visit but he has run out and he has a follow up with neurology in about a week. He mentions titrating down his Suboxone as instructed and he states he is 'livable' on 2 films daily but he has begun to experience withdrawal symptoms including diarrhea (4-5 bowel movements daily), sweating, irritability and muscle cramps.   Past Medical History:  Past Medical History:  Diagnosis Date  . Degenerative disc disease, lumbar   . Dental crown present   . History of kidney stones   . Hypertension    states under control with meds., has been on med. x 1 yr. - is currently out of med., to see PCP 02/06/2018  . Muscle atrophy 01/2018  . Muscle weakness 01/2018     ROS:  Review of Systems  Constitutional: Positive for diaphoresis and malaise/fatigue. Negative for chills and fever.  Respiratory: Negative for shortness of breath.    Cardiovascular: Negative for chest pain.  Gastrointestinal: Positive for abdominal pain and diarrhea. Negative for constipation, nausea and vomiting.  Musculoskeletal: Positive for back pain and myalgias.     Assessment / Plan / Recommendations:   Please see A&P under problem oriented charting for assessment of the patient's acute and chronic medical conditions.   As always, pt is advised that if symptoms worsen or new symptoms arise, they should go to an urgent care facility or to to ER for further evaluation.   Consent and Medical Decision Making:   Patient discussed with Dr. Daryll Drown  This is a telephone encounter between [place] and Mosetta Anis on 02/24/2019 for opioid use disorder. The visit was conducted with the patient located at home and Mosetta Anis at [facility]. The patient's identity was confirmed using their DOB and current address. The patient has consented to being evaluated through a telephone encounter and understands the associated risks (an examination cannot be done and the patient may need to come in for an appointment) / benefits (allows the patient to remain at home, decreasing exposure to coronavirus). I personally spent 23 minutes on medical discussion.

## 2019-02-25 NOTE — Telephone Encounter (Signed)
Ok. Thanks for [employer]. Noted.

## 2019-02-26 NOTE — Progress Notes (Signed)
Internal Medicine Clinic Attending  Case discussed with Dr. Truman Hayward at the time of the visit.  We reviewed the resident's history, telephone coversation and pertinent patient test results.  I agree with the assessment, diagnosis, and plan of care documented in the resident's note.

## 2019-03-18 ENCOUNTER — Other Ambulatory Visit: Payer: Self-pay | Admitting: *Deleted

## 2019-03-18 DIAGNOSIS — G729 Myopathy, unspecified: Secondary | ICD-10-CM | POA: Diagnosis not present

## 2019-03-18 DIAGNOSIS — R748 Abnormal levels of other serum enzymes: Secondary | ICD-10-CM

## 2019-03-18 DIAGNOSIS — R94131 Abnormal electromyogram [EMG]: Secondary | ICD-10-CM | POA: Diagnosis not present

## 2019-03-18 DIAGNOSIS — H538 Other visual disturbances: Secondary | ICD-10-CM | POA: Diagnosis not present

## 2019-03-18 DIAGNOSIS — F1120 Opioid dependence, uncomplicated: Secondary | ICD-10-CM

## 2019-03-18 DIAGNOSIS — R531 Weakness: Secondary | ICD-10-CM | POA: Diagnosis not present

## 2019-03-18 DIAGNOSIS — R208 Other disturbances of skin sensation: Secondary | ICD-10-CM | POA: Diagnosis not present

## 2019-03-18 MED ORDER — BUPRENORPHINE HCL-NALOXONE HCL 8-2 MG SL FILM: ORAL_FILM | SUBLINGUAL | 0 refills | Status: DC

## 2019-03-18 NOTE — Telephone Encounter (Signed)
Pt states he will be out of suboxone in about 8 days, refill request sent  Made appt for OUD clinic 9/22  Pt states he is just leaving neuro office at [facility] and md told him he was not "going to get in the prescribing of prednisone for his back pain". Pt states he is taking great amts of IBU and tylenol daily and feels he needs more prednisone and a higher dose because it really helps a lot. Please refer to 8/18 call from [place] at [facility] pharm Please advise

## 2019-03-18 NOTE — Telephone Encounter (Signed)
I would defer to his neurologist for further steroids.  We don't have a CK level in our system or in Care everywhere.  If neurology declined to fill, would not recommend filling at this time. .  Will refill 4 week supply of buprenorphine.

## 2019-03-20 MED FILL — TESTOSTERONE CYP 200 MG/ML: 200 | 84 days supply | Qty: 12 | Fill #1

## 2019-03-26 DIAGNOSIS — G729 Myopathy, unspecified: Secondary | ICD-10-CM | POA: Diagnosis not present

## 2019-03-26 DIAGNOSIS — M6281 Muscle weakness (generalized): Secondary | ICD-10-CM | POA: Diagnosis not present

## 2019-03-27 ENCOUNTER — Other Ambulatory Visit: Payer: Self-pay

## 2019-03-27 MED FILL — BUPRENORPHINE HCL-NALOXONE: 8-2 | 30 days supply | Qty: 75 | Fill #0

## 2019-03-27 NOTE — Telephone Encounter (Signed)
LM for patient that Rx was sent to [facility] on 03/18/2019. Confirmed with Jinny Blossom that they have this Rx. She will get this ready for patient now. Hubbard Hartshorn, RN, BSN

## 2019-03-27 NOTE — Telephone Encounter (Signed)
Buprenorphine HCl-Naloxone HCl 8-2 MG FILM, refill request @  [place], [place] - [address]. [phone] (Phone) [phone] (Fax)

## 2019-03-31 ENCOUNTER — Other Ambulatory Visit: Payer: Self-pay

## 2019-03-31 ENCOUNTER — Ambulatory Visit (INDEPENDENT_AMBULATORY_CARE_PROVIDER_SITE_OTHER): Payer: 59 | Admitting: Student in an Organized Health Care Education/Training Program

## 2019-03-31 VITALS — BP 151/99 | HR 109 | Temp 98.4°F

## 2019-03-31 DIAGNOSIS — F1120 Opioid dependence, uncomplicated: Secondary | ICD-10-CM | POA: Diagnosis not present

## 2019-03-31 NOTE — Assessment & Plan Note (Signed)
Symptomatically stable.  Plan is to continue with Suboxone 8 mg film use 2.5 daily.  He recently had a refill last week.  Plan to follow-up in 4 weeks for a telehealth visit.  Urine tox screen ordered today.

## 2019-03-31 NOTE — Progress Notes (Signed)
   03/31/2019  Joseph Ayala presents for follow up of opioid use disorder I have reviewed the prior induction visit, follow up visits, and telephone encounters relevant to opiate use disorder (OUD) treatment.   Current daily dose: Suboxone 16 mg daily  Date of Induction: 09/09/18  Current follow up interval, in weeks: 4  The patient has been adherent with the buprenorphine for OUD contract.   Last UDS Result: Appropriate for Suboxone, also with amphetamines and alcohol  HPI: 53 year old man here for follow-up of opioid use disorder.  Previously addicted to oral opioids like oxycodone.  Doing well on Suboxone for many years now.  Reports cravings are well controlled.  Reports good compliance with medications.  He is going through a lot right now for a diagnosis of his chronically elevated CK levels and muscle aches.  Is arranged for a muscle biopsy in a few weeks from now with [facility].  Has had a lot of testing done through the neurology department.  He has primary care physician who has tiring of the year and is looking for a new primary care physician.  Exam:   Vitals:   03/31/19 0956 03/31/19 1006  BP: (!) 171/104 (!) 151/99  Pulse: (!) 115 (!) 109  Temp: 98.4 F (36.9 C)   TempSrc: Oral     General: Well-appearing, no distress Heart: Regular rate and rhythm with no murmurs Extremities: Warm well perfused, normal joints Psych: A little distractible, normal affect, not depressed or anxious appearing Neuro: Normal strength upper and lower extremities, normal gait  Assessment/Plan:  See Problem Based Charting in the Encounters Tab    Joseph Filler, MD  03/31/2019  10:31 AM

## 2019-04-03 LAB — TOXASSURE SELECT,+ANTIDEPR,UR

## 2019-04-10 DIAGNOSIS — G7289 Other specified myopathies: Secondary | ICD-10-CM | POA: Diagnosis not present

## 2019-04-10 DIAGNOSIS — M62529 Muscle wasting and atrophy, not elsewhere classified, unspecified upper arm: Secondary | ICD-10-CM | POA: Diagnosis not present

## 2019-04-10 DIAGNOSIS — M6281 Muscle weakness (generalized): Secondary | ICD-10-CM | POA: Diagnosis not present

## 2019-04-10 MED FILL — predniSONE 20 MG TABS: 20 | 57 days supply | Qty: 50 | Fill #0

## 2019-04-23 ENCOUNTER — Other Ambulatory Visit: Payer: Self-pay | Admitting: Student in an Organized Health Care Education/Training Program

## 2019-04-23 ENCOUNTER — Other Ambulatory Visit: Payer: Self-pay | Admitting: *Deleted

## 2019-04-23 DIAGNOSIS — F1120 Opioid dependence, uncomplicated: Secondary | ICD-10-CM

## 2019-04-23 MED ORDER — BUPRENORPHINE HCL-NALOXONE HCL 8-2 MG SL FILM: ORAL_FILM | SUBLINGUAL | 0 refills | Status: DC

## 2019-04-23 NOTE — Telephone Encounter (Signed)
Informed pt to call pharm

## 2019-04-23 NOTE — Telephone Encounter (Signed)
snt in new encounter

## 2019-04-23 NOTE — Telephone Encounter (Signed)
Needs refill on Buprenorphine HCl-Naloxone HCl 8-2 MG FILM  ;pt contact [place], [place] - [address] [facility].

## 2019-04-23 NOTE — Telephone Encounter (Signed)
Refill request  Buprenorphine HCl-Naloxone HCl 8-2 MG FILM  [facility] - [place], [place] - [address].

## 2019-04-24 MED FILL — BUPRENORPHINE HCL-NALOXONE: 8-2 | 30 days supply | Qty: 75 | Fill #0

## 2019-04-28 ENCOUNTER — Other Ambulatory Visit: Payer: Self-pay | Admitting: *Deleted

## 2019-04-28 ENCOUNTER — Other Ambulatory Visit: Payer: Self-pay

## 2019-04-28 ENCOUNTER — Ambulatory Visit (INDEPENDENT_AMBULATORY_CARE_PROVIDER_SITE_OTHER): Payer: 59 | Admitting: Student in an Organized Health Care Education/Training Program

## 2019-04-28 DIAGNOSIS — M609 Myositis, unspecified: Secondary | ICD-10-CM | POA: Diagnosis not present

## 2019-04-28 DIAGNOSIS — F1190 Opioid use, unspecified, uncomplicated: Secondary | ICD-10-CM

## 2019-04-28 DIAGNOSIS — Z7952 Long term (current) use of systemic steroids: Secondary | ICD-10-CM | POA: Diagnosis not present

## 2019-04-28 DIAGNOSIS — B370 Candidal stomatitis: Secondary | ICD-10-CM | POA: Diagnosis not present

## 2019-04-28 DIAGNOSIS — F1120 Opioid dependence, uncomplicated: Secondary | ICD-10-CM

## 2019-04-28 DIAGNOSIS — R748 Abnormal levels of other serum enzymes: Secondary | ICD-10-CM

## 2019-04-28 MED ORDER — NYSTATIN 100000 UNIT/ML MT SUSP: OROMUCOSAL | 0 refills | Status: DC

## 2019-04-28 MED FILL — NYSTATIN 100,000 UNITS/ML S: 100000 | 3 days supply | Qty: 60 | Fill #0

## 2019-04-28 NOTE — Patient Instructions (Signed)
Telephone visit.  

## 2019-04-28 NOTE — Progress Notes (Signed)
  [facility] Internal Medicine Residency Telephone Encounter Continuity Care Appointment  HPI:   This telephone encounter was created for [name] on 04/28/2019 for the following purpose/cc of follow-up for opioid use disorder.  53 year old man with opioid use disorder and going through evaluation for acute myositis right now with [facility] neurology.  He had a muscle biopsy about 2 weeks ago, results are still pending.  He was started on 40 mg of prednisone about a week ago for increasing muscle pain and elevated enzymes.  He noted increasing thrush over the last few days in his mouth, he describes it as a white plaque.  Denies any pain with swallowing.  He denies any swelling in his neck.  Eating and drinking well.  He has had thrush before which has been well treated with Magic mouthwash.  He reports his opioid cravings are under good control on Suboxone 2-1/2 films per day.  Reports good compliance with the medicine.  Denies any other drug use or recent relapses.  His chronic pain is controlled right now.  He has good family support through his wife who is a Marine scientist.   Past Medical History:  Past Medical History:  Diagnosis Date  . Degenerative disc disease, lumbar   . Dental crown present   . History of kidney stones   . Hypertension    states under control with meds., has been on med. x 1 yr. - is currently out of med., to see PCP 02/06/2018  . Muscle atrophy 01/2018  . Muscle weakness 01/2018      ROS:   No fevers, no chills, no chest pain, no back pain   Assessment / Plan / Recommendations:   Please see A&P under problem oriented charting for assessment of the patient's acute and chronic medical conditions.   As always, pt is advised that if symptoms worsen or new symptoms arise, they should go to an urgent care facility or to to ER for further evaluation.   Consent and Medical Decision Making:   This is a telephone encounter between Griffin T Roberg and [name] on 04/28/2019 for opioid use disorder. The visit was conducted with the patient located at home and [name] at [facility]. The patient's identity was confirmed using their DOB and current address. The patient has consented to being evaluated through a telephone encounter and understands the associated risks (an examination cannot be done and the patient may need to come in for an appointment) / benefits (allows the patient to remain at home, decreasing exposure to coronavirus). I personally spent 13 minutes on medical discussion.

## 2019-04-28 NOTE — Telephone Encounter (Signed)
Yes.  Thank you.

## 2019-04-28 NOTE — Telephone Encounter (Signed)
[facility] calls to verify dose on nystatin susp. Gave VO for 58mls every 6 hrs, do you agree?

## 2019-04-28 NOTE — Assessment & Plan Note (Signed)
Moderate opioid use disorder is well controlled.  We reviewed his recent tox assure from September.  He is using alcohol but does not seem to be interacting with the Suboxone.  Plan to continue with 2.5 films per day to control his cravings.  Follow-up with me in 1 month, appointment is already arranged.

## 2019-04-28 NOTE — Assessment & Plan Note (Signed)
Patient reports symptoms of typical oral thrush due to recently starting prednisone.  No symptoms to suggest esophageal thrush.  Plan is to treat with nystatin suspension, instructed him to swish and spit every 6 hours until the thrush resolves.  He is currently on prednisone for about 4 weeks and potentially longer depending on his muscle biopsy result.

## 2019-05-06 DIAGNOSIS — M6281 Muscle weakness (generalized): Secondary | ICD-10-CM | POA: Diagnosis not present

## 2019-05-06 DIAGNOSIS — G729 Myopathy, unspecified: Secondary | ICD-10-CM | POA: Diagnosis not present

## 2019-05-08 MED FILL — NYSTATIN 100,000 UNITS/ML S: 100000 | 3 days supply | Qty: 60 | Fill #0

## 2019-05-15 MED FILL — MAGIC MOUTHWASH BOP FORM: 14 days supply | Qty: 840 | Fill #0

## 2019-05-21 ENCOUNTER — Other Ambulatory Visit: Payer: Self-pay | Admitting: Student in an Organized Health Care Education/Training Program

## 2019-05-21 DIAGNOSIS — F1120 Opioid dependence, uncomplicated: Secondary | ICD-10-CM

## 2019-05-22 MED FILL — BUPRENORPHINE HCL-NALOXONE: 8-2 | 30 days supply | Qty: 75 | Fill #0

## 2019-05-22 NOTE — Telephone Encounter (Signed)
Pt is calling back regarding medicine, pt contact [phone]     Buprenorphine HCl-Naloxone HCl 8-2 MG Joseph Ayala, Prien.

## 2019-06-01 ENCOUNTER — Encounter: Payer: Self-pay | Admitting: Student in an Organized Health Care Education/Training Program

## 2019-06-01 ENCOUNTER — Ambulatory Visit (INDEPENDENT_AMBULATORY_CARE_PROVIDER_SITE_OTHER): Payer: 59 | Admitting: Student in an Organized Health Care Education/Training Program

## 2019-06-01 VITALS — BP 135/92 | HR 106 | Temp 98.1°F | Ht 71.0 in | Wt 204.6 lb

## 2019-06-01 DIAGNOSIS — R6889 Other general symptoms and signs: Secondary | ICD-10-CM | POA: Diagnosis not present

## 2019-06-01 DIAGNOSIS — F1120 Opioid dependence, uncomplicated: Secondary | ICD-10-CM | POA: Diagnosis not present

## 2019-06-01 DIAGNOSIS — G7289 Other specified myopathies: Secondary | ICD-10-CM | POA: Diagnosis not present

## 2019-06-01 DIAGNOSIS — R7989 Other specified abnormal findings of blood chemistry: Secondary | ICD-10-CM

## 2019-06-01 DIAGNOSIS — Z79899 Other long term (current) drug therapy: Secondary | ICD-10-CM

## 2019-06-01 MED ORDER — TESTOSTERONE CYPIONATE 200 MG/ML IM SOLN: 400.0000 mg | INTRAMUSCULAR | 3 refills | Status: DC

## 2019-06-01 MED ORDER — BUPRENORPHINE HCL-NALOXONE HCL 8-2 MG SL FILM: ORAL_FILM | SUBLINGUAL | 0 refills | Status: DC

## 2019-06-01 NOTE — Progress Notes (Signed)
   Assessment and Plan:  See Encounters tab for problem-based medical decision making.   __________________________________________________________  HPI:   53 year old man here for follow-up for opioid use disorder.  He is well-known to me through the [place] clinic but now presenting to transfer his primary care to the [facility].  Previously under the excellent care of Dr. Zada Girt who is retiring this year.  He reports doing well at home since I last talked to her about a month ago.  Good compliance with Suboxone without adverse side effect.  Cravings are under good control.  Denies any relapses.  He is got a couple other acute issues going on right now including this work-up for his myopathy over the last year.  This is being managed by [facility] neurology, he had a muscle biopsy completed in October and the pathology showed only necrotic myopathy.  He recently tapered off prednisone and is currently not on any immunosuppressives.  Says he is starting to feel the muscle aches coming back.  Has got good functional status at home.  Also uses testosterone supplements, injects 2 mL every 2 weeks intramuscularly without problem.  Has been on this medication since 2009.  Says that it helps with his energy levels, also his focus.  He thinks that he has ADHD because he is distracted with his daily tasks often.  Has not been on ADHD treatment in the past.  Also complaining of frequent sweating and heat intolerance over the last few days.  His wife is a Marine scientist working on [employer] daughter is a Marine scientist working in the operating room at [employer].  __________________________________________________________  Problem List: Patient Active Problem List   Diagnosis Date Noted  . Moderate opioid use disorder ([place]) 09/11/2018    Priority: High  . Necrotizing myopathy 12/27/2017    Priority: High  . Chronic pain syndrome 10/22/2017    Priority: Medium  . Degeneration of lumbar intervertebral disc 09/25/2017    Priority:  Medium  . Lumbar spondylosis 09/25/2017    Priority: Medium  . Low testosterone 06/01/2019    Priority: Low  . Heat intolerance 06/01/2019  . Thrush 04/28/2019    Medications: Reconciled today in Epic __________________________________________________________  Physical Exam:  Vital Signs: Vitals:   06/01/19 0949  BP: (!) 135/92  Pulse: (!) 106  Temp: 98.1 F (36.7 C)  TempSrc: Oral  SpO2: 98%  Weight: 204 lb 9.6 oz (92.8 kg)  Height: 5\' 11"  (1.803 m)    Gen: Well appearing, NAD, mildly diaphoretic Neck: No cervical LAD, No thyromegaly or nodules, No JVD. CV: RRR, no murmurs Pulm: Normal effort, CTA throughout, no wheezing.  Ext: Warm, no edema, normal joints

## 2019-06-01 NOTE — Assessment & Plan Note (Signed)
Patient reports low testosterone requiring supplementation since at least 2009.  Has been on a stable dose.  Prior labs monitoring done by Dr. Nyoka Cowden.  He reports finding good benefit with testosterone supplementation in terms of his energy and focus.  Plan is to continue with testosterone supplementation 400 mg IM injection every 2 weeks.  He does not injections himself without problem.  Will check CBC today to ensure there is no thrombocytopenia or polycythemia.

## 2019-06-01 NOTE — Assessment & Plan Note (Signed)
Moderate opioid use disorder is stable.  Doing well on Suboxone 2.5 films per day.  Reviewed the database which was appropriate.  Plan to check urine tox today.  Continue with current dosing, I prescribed him a full week supply.  Follow-up with a telephone visit with me in late December.

## 2019-06-01 NOTE — Assessment & Plan Note (Signed)
Stable necrotizing myopathy being managed by [facility] neurology.  Muscle biopsy completed in October.  I am unsure of the final diagnosis, what inflammatory condition is causing the necrotizing changes.  Patient has a follow-up visit with the neurologist in January is going to try to talk with her over the phone soon.  Currently he is off prednisone.  We will check a CK today.

## 2019-06-01 NOTE — Assessment & Plan Note (Signed)
Recent heat intolerance, diaphoretic on exam today.  Plan to check TSH to rule out hyperthyroidism.

## 2019-06-01 NOTE — Patient Instructions (Signed)
Is a pleasure seeing you today in clinic.  I will be happy to take over as her primary care physician.  I will review your records from Dr. Nyoka Cowden with arrive.  We will check blood work today, I sent a refill prescription of testosterone and Suboxone.  Please follow-up with me in about 5 weeks for a telephone visit.

## 2019-06-02 ENCOUNTER — Telehealth: Payer: Self-pay | Admitting: Student in an Organized Health Care Education/Training Program

## 2019-06-02 LAB — CBC
Hematocrit: 46.3 % (ref 37.5–51.0)
Hemoglobin: 16.1 g/dL (ref 13.0–17.7)
MCH: 31.8 pg (ref 26.6–33.0)
MCHC: 34.8 g/dL (ref 31.5–35.7)
MCV: 91 fL (ref 79–97)
Platelets: 178 10*3/uL (ref 150–450)
RBC: 5.07 x10E6/uL (ref 4.14–5.80)
RDW: 13.2 % (ref 11.6–15.4)
WBC: 9.4 10*3/uL (ref 3.4–10.8)

## 2019-06-02 LAB — CMP14 + ANION GAP
ALT: 81 IU/L — ABNORMAL HIGH (ref 0–44)
AST: 56 IU/L — ABNORMAL HIGH (ref 0–40)
Albumin/Globulin Ratio: 2.4 — ABNORMAL HIGH (ref 1.2–2.2)
Albumin: 4.3 g/dL (ref 3.8–4.9)
Alkaline Phosphatase: 61 IU/L (ref 39–117)
Anion Gap: 13 mmol/L (ref 10.0–18.0)
BUN/Creatinine Ratio: 22 — ABNORMAL HIGH (ref 9–20)
BUN: 17 mg/dL (ref 6–24)
Bilirubin Total: 0.6 mg/dL (ref 0.0–1.2)
CO2: 28 mmol/L (ref 20–29)
Calcium: 9.5 mg/dL (ref 8.7–10.2)
Chloride: 100 mmol/L (ref 96–106)
Creatinine, Ser: 0.77 mg/dL (ref 0.76–1.27)
GFR calc Af Amer: 121 mL/min/{1.73_m2} (ref >59)
GFR calc non Af Amer: 105 mL/min/{1.73_m2} (ref >59)
Globulin, Total: 1.8 g/dL (ref 1.5–4.5)
Glucose: 85 mg/dL (ref 65–99)
Potassium: 5.2 mmol/L (ref 3.5–5.2)
Sodium: 141 mmol/L (ref 134–144)
Total Protein: 6.1 g/dL (ref 6.0–8.5)

## 2019-06-02 LAB — TSH: TSH: 2.27 u[IU]/mL (ref 0.450–4.500)

## 2019-06-02 LAB — TESTOSTERONE: Testosterone: >1500 ng/dL — ABNORMAL HIGH (ref 264–916)

## 2019-06-02 LAB — CK: Total CK: 560 U/L (ref 41–331)

## 2019-06-02 NOTE — Telephone Encounter (Signed)
Pt is returning phone call about his lab work.

## 2019-06-05 LAB — TOXASSURE SELECT,+ANTIDEPR,UR

## 2019-06-15 MED FILL — TESTOSTERONE CYP 200 MG/ML: 200 | 28 days supply | Qty: 4 | Fill #0

## 2019-06-19 MED FILL — BUPRENORPHINE HCL-NALOXONE: 8-2 | 30 days supply | Qty: 75 | Fill #0

## 2019-06-29 ENCOUNTER — Encounter: Payer: Self-pay | Admitting: Student in an Organized Health Care Education/Training Program

## 2019-06-29 ENCOUNTER — Ambulatory Visit (INDEPENDENT_AMBULATORY_CARE_PROVIDER_SITE_OTHER): Payer: 59 | Admitting: Student in an Organized Health Care Education/Training Program

## 2019-06-29 DIAGNOSIS — Z7952 Long term (current) use of systemic steroids: Secondary | ICD-10-CM

## 2019-06-29 DIAGNOSIS — F909 Attention-deficit hyperactivity disorder, unspecified type: Secondary | ICD-10-CM

## 2019-06-29 DIAGNOSIS — B370 Candidal stomatitis: Secondary | ICD-10-CM | POA: Diagnosis not present

## 2019-06-29 DIAGNOSIS — F1120 Opioid dependence, uncomplicated: Secondary | ICD-10-CM

## 2019-06-29 DIAGNOSIS — R7989 Other specified abnormal findings of blood chemistry: Secondary | ICD-10-CM | POA: Diagnosis not present

## 2019-06-29 DIAGNOSIS — M545 Low back pain: Secondary | ICD-10-CM

## 2019-06-29 MED ORDER — BUPRENORPHINE HCL-NALOXONE HCL 8-2 MG SL FILM: ORAL_FILM | SUBLINGUAL | 1 refills | Status: DC

## 2019-06-29 MED ORDER — NYSTATIN 100000 UNIT/ML MT SUSP: OROMUCOSAL | 0 refills | Status: DC

## 2019-06-29 MED FILL — NYSTATIN 100,000 UNITS/ML S: 100000 | 3 days supply | Qty: 60 | Fill #0

## 2019-06-29 NOTE — Assessment & Plan Note (Signed)
Patient has been supplementing testosterone for several years.  Good symptomatic benefits.  Our last testosterone level was poorly timed as he had recently taken a dose, so it is falsely elevated.  We talked about the need to repeat this sometime in the next few months, and ideally we should draw the lab about 1 week after his dose of testosterone.  We will follow this up in the spring.

## 2019-06-29 NOTE — Assessment & Plan Note (Signed)
Patient with thrush related to use of prednisone.  We treated him with a nystatin swish and spit suspension which had good benefit.  Subsequently he had to use more prednisone for his myositis.  Now he is complaining of oral thrush again.  Denies any pain with swallowing so I doubt esophageal involvement.  Will prescribe nystatin swish and spit solution again.  Patient is due to follow-up with his neurologist and hopefully will get a better treatment strategy other than glucocorticoids in the future.

## 2019-06-29 NOTE — Progress Notes (Signed)
  [facility] Internal Medicine Residency Telephone Encounter Continuity Care Appointment  HPI:   This telephone encounter was created for [name] on 06/29/2019 for the following purpose/cc follow-up of opioid use disorder.  Patient is doing well.  Reports good adherence with his medications without adverse side effects.  He reports that his thrush is well treated with nystatin swish and spit.  However his oral thrush has returned since taking another short course of prednisone.  He resumed prednisone on his own after having some low back pain.  He has a follow-up appointment with his neurologist in early January to go over her recent biopsy results of his muscle.  He is hoping that there is a better long-term treatment plan for his myositis.  Opioid cravings are well controlled.  Tolerating Suboxone well.  Good access to this medication.  Lives at home with his wife who is a Marine scientist working at [employer].  She recently received a Covid vaccine and he had a couple questions about continuing to use precautions.    Past Medical History:  Past Medical History:  Diagnosis Date  . Degenerative disc disease, lumbar   . Dental crown present   . History of kidney stones   . Hypertension    states under control with meds., has been on med. x 1 yr. - is currently out of med., to see PCP 02/06/2018  . Muscle atrophy 01/2018  . Muscle weakness 01/2018      ROS:   No fevers, no chills, no chest pain or shortness of breath   Assessment / Plan / Recommendations:   Please see A&P under problem oriented charting for assessment of the patient's acute and chronic medical conditions.   As always, pt is advised that if symptoms worsen or new symptoms arise, they should go to an urgent care facility or to to ER for further evaluation.   Consent and Medical Decision Making:    This is a telephone encounter between Joseph Ayala and [name] on 06/29/2019 for opioid use disorder.  The visit was conducted with the patient located at home and [name] at [facility]. The patient's identity was confirmed using their DOB and current address. The patient has consented to being evaluated through a telephone encounter and understands the associated risks (an examination cannot be done and the patient may need to come in for an appointment) / benefits (allows the patient to remain at home, decreasing exposure to coronavirus). I personally spent 10 minutes on medical discussion.

## 2019-06-29 NOTE — Assessment & Plan Note (Signed)
Patient with concern that he has ADHD that is not currently being treated.  He reports that this can be bothersome to his day-to-day functioning.  He does show some distractibility during our conversations.  I am going to investigate who the best referral will be to see if there is any formal diagnosis of ADHD.

## 2019-06-29 NOTE — Assessment & Plan Note (Signed)
Patient doing well, on treatment for moderate opioid use disorder.  He reports cravings are well controlled, no relapses.  Plan to continue with Suboxone 8 mg films 2 and 1/2 films per day.  We reviewed his last urine tox assure, he is not aware of why there is amphetamine metabolites, we will follow this up in a few months to see if it happens again.  Otherwise the tox assure is appropriate.  I refilled a 30-day prescription with 1 refill for an overall 18-month supply.  We will follow-up with another telephone visit in 2 months.

## 2019-07-13 ENCOUNTER — Encounter: Payer: Self-pay | Admitting: Internal Medicine

## 2019-07-13 ENCOUNTER — Ambulatory Visit: Payer: 59 | Admitting: Internal Medicine

## 2019-07-13 ENCOUNTER — Encounter: Payer: Self-pay | Admitting: Student in an Organized Health Care Education/Training Program

## 2019-07-13 ENCOUNTER — Telehealth: Payer: Self-pay | Admitting: Student in an Organized Health Care Education/Training Program

## 2019-07-13 ENCOUNTER — Other Ambulatory Visit: Payer: Self-pay

## 2019-07-13 VITALS — BP 141/102 | HR 121 | Temp 99.1°F | Ht 71.0 in | Wt 213.5 lb

## 2019-07-13 DIAGNOSIS — I10 Essential (primary) hypertension: Secondary | ICD-10-CM

## 2019-07-13 DIAGNOSIS — R000 Tachycardia, unspecified: Secondary | ICD-10-CM | POA: Diagnosis not present

## 2019-07-13 DIAGNOSIS — R358 Other polyuria: Secondary | ICD-10-CM | POA: Diagnosis not present

## 2019-07-13 DIAGNOSIS — R3589 Other polyuria: Secondary | ICD-10-CM

## 2019-07-13 NOTE — Assessment & Plan Note (Signed)
Patient has been hypertensive at his last several clinic visits. This could be secondary to his testosterone use as his last testosterone level was greater than 1500. However he has been on blood pressure medications in the past. He is unable to tell me which ones he was on states that it was several years ago. He does have normal renal function was his last BMP.  A/P: - Would benefit from antihypertensive therapy. Will await UA results.

## 2019-07-13 NOTE — Telephone Encounter (Signed)
Encounter opened in error

## 2019-07-13 NOTE — Assessment & Plan Note (Signed)
Patient with resting tachycardia. He has been tachycardic at his last several office visits. It is regular on physical exam. I reviewed his blood work and at his last lab draws his TSH was within normal limits. We did not get an EKG and it does not appear that he has had an EKG had any of his prior visits. At his next visit if he remains tachycardic we should obtain an EKG

## 2019-07-13 NOTE — Progress Notes (Signed)
   CC: Polyuria  HPI:  [name] is a 54 y.o. male with PMHx listed below presenting for polyuria. Please see the A&P for the status of the patient's chronic medical problems.  Past Medical History:  Diagnosis Date  . Degenerative disc disease, lumbar   . Dental crown present   . History of kidney stones   . Hypertension    states under control with meds., has been on med. x 1 yr. - is currently out of med., to see PCP 02/06/2018  . Muscle atrophy 01/2018  . Muscle weakness 01/2018   Review of Systems:  Performed and all others negative.  Physical Exam: Vitals:   07/13/19 1409  BP: (!) 141/102  Pulse: (!) 121  Temp: 99.1 F (37.3 C)  TempSrc: Oral  SpO2: 98%  Weight: 213 lb 8 oz (96.8 kg)  Height: 5\' 11"  (1.803 m)   General: Well nourished male in no acute distress Pulm: Good air movement with no wheezing or crackles  CV: RRR, no murmurs, no rubs  Abdomen: Active bowel sounds, soft, non-distended, no tenderness to palpation   Assessment & Plan:   See Encounters Tab for problem based charting.  Patient seen with Dr. 

## 2019-07-13 NOTE — Telephone Encounter (Signed)
Spoke with the patient on the phone.  Sounds like he had a self-limited gastroenteritis over the last 5 days which is now improving.  As a separate issue over the last 2 days he has had a sensation of lower urinary tract symptoms.  Denies dysuria but feels like he is not emptying his bladder.  Urinated 30+ times yesterday.  Has not had a history of prostate enlargement or bladder outlet obstruction.  I advised we will need to bring him into the clinic for evaluation.  We will want to check a urinalysis and do a point-of-care ultrasound to look for bladder distention and prostate enlargement. If there is prostate enlargement will need to check a PSA to ensure it is not malignant.  Patient available to come this afternoon to [facility] at 145.

## 2019-07-13 NOTE — Patient Instructions (Addendum)
Thank you for allowing [place] to provide your care. We are gonna check urine analysis and I will call you if we need to start an antibiotic.  For an ADHD evaluation, please contact [facility] at [phone] to schedule an evaluation. Let me know if they require a referral and I will place it.

## 2019-07-13 NOTE — Assessment & Plan Note (Addendum)
Patient presents with 24 to 48 hours of polyuria. He states that over the last 24 hours his feet in excess of 30 times. Overnight he experienced significant nocturia and got up to go the bathroom at least 15 times. He describes urinary hesitancy and weak stream whenever he does try to void. He consistently feels that he has leftover urine in his bladder. He does have some lower abdominal pain. He denies hematuria, penile discharge, or pneumaturia. He has noticed foul-smelling urine and some testicular pain that proceeded this polyuria.  He has been on testosterone supplementation since 2009. He states that he previously followed up with Dr. Chilton Si. His PSA was checked every year and he reports that it was always normal. He has never had similar symptoms to this in the past. He is monogamous and sexually active with his wife. He is never had an STI/STD.   On bedside ultrasound patient's bladder contains minimal urine. Prostate measures 4.1cm x 2.4cm x 3.5cm. bladder scan with less than 100 mL.  A/P: - Prostate volume calculated to be 17.9 which is within normal limits. However, we will check a PSA - We will check a UA and prescribe antibiotics as needed.

## 2019-07-13 NOTE — Addendum Note (Signed)
Addended by: Bufford Spikes on: 07/13/2019 04:34 PM   Modules accepted: Orders

## 2019-07-14 ENCOUNTER — Other Ambulatory Visit: Payer: 59

## 2019-07-14 LAB — URINALYSIS, ROUTINE W REFLEX MICROSCOPIC
Bilirubin, UA: NEGATIVE
Glucose, UA: NEGATIVE
Leukocytes,UA: NEGATIVE
Nitrite, UA: NEGATIVE
Protein,UA: NEGATIVE
RBC, UA: NEGATIVE
Specific Gravity, UA: 1.023 (ref 1.005–1.030)
Urobilinogen, Ur: 0.2 mg/dL (ref 0.2–1.0)
pH, UA: 6.5 (ref 5.0–7.5)

## 2019-07-14 NOTE — Addendum Note (Signed)
Addended by: Erlinda Hong T on: 07/14/2019 04:03 PM   Modules accepted: Level of Service

## 2019-07-14 NOTE — Progress Notes (Signed)
Internal Medicine Clinic Attending  I saw and evaluated the patient.  I personally confirmed the key portions of the history and exam documented by Dr. Helberg and I reviewed pertinent patient test results.  The assessment, diagnosis, and plan were formulated together and I agree with the documentation in the resident's note. 

## 2019-07-15 MED FILL — TESTOSTERONE CYP 200 MG/ML: 200 | 28 days supply | Qty: 4 | Fill #1

## 2019-07-16 ENCOUNTER — Other Ambulatory Visit: Payer: Self-pay | Admitting: Student in an Organized Health Care Education/Training Program

## 2019-07-16 MED FILL — BUPRENORPHINE HCL-NALOXONE: 8-2 | 30 days supply | Qty: 75 | Fill #0

## 2019-07-16 NOTE — Telephone Encounter (Signed)
Refill Request- Pt   Reporting  He Picked up his other medications today at [facility] the [facility]acy and was unable to pick up his medication listed below   Buprenorphine HCl-Naloxone HCl 8-2 MG FILM on Saturday 07/18/2019 when it is due.    Pt states the [facility] has referred him to the [facility] for Saturday if .  Please call the patient if you have any questions.

## 2019-07-16 NOTE — Telephone Encounter (Signed)
To clarify my note to the pharmacy, it is ok with me for him to fill this two days early. I did not realize 1/9 was a Saturday when I wrote that restriction.

## 2019-07-16 NOTE — Telephone Encounter (Signed)
[facility] outpatient pharmacy called and notified Dr. Oswaldo Done authorized early refill.  Pt notified. SChaplin, RN,BSN

## 2019-07-16 NOTE — Telephone Encounter (Signed)
TC placed to [facility] outpatient pharmacy, RN states pt has RX for suboxone due for refill on 07/18/19.  [facility] outpatient pharmacy states they will be closed on Saturday, but pt can pick up at [facility].  Pt notified. Will forward to PCP to advise if Suboxone RX can be transferred or if pt needs a new RX called into [facility] outpatient pharmacy. Thank you, SChaplin, RN,BSN

## 2019-07-23 DIAGNOSIS — G729 Myopathy, unspecified: Secondary | ICD-10-CM | POA: Diagnosis not present

## 2019-07-23 DIAGNOSIS — Z79899 Other long term (current) drug therapy: Secondary | ICD-10-CM | POA: Diagnosis not present

## 2019-07-23 DIAGNOSIS — M6281 Muscle weakness (generalized): Secondary | ICD-10-CM | POA: Diagnosis not present

## 2019-08-14 MED FILL — TESTOSTERONE CYP 200 MG/ML: 200 | 28 days supply | Qty: 4 | Fill #2

## 2019-08-14 MED FILL — BUPREN-NALOX FILM 8-2MG: 8-2 | 30 days supply | Qty: 75 | Fill #1

## 2019-08-31 ENCOUNTER — Encounter: Payer: Self-pay | Admitting: Student in an Organized Health Care Education/Training Program

## 2019-08-31 ENCOUNTER — Other Ambulatory Visit: Payer: Self-pay

## 2019-08-31 ENCOUNTER — Ambulatory Visit (INDEPENDENT_AMBULATORY_CARE_PROVIDER_SITE_OTHER): Payer: 59 | Admitting: Student in an Organized Health Care Education/Training Program

## 2019-08-31 DIAGNOSIS — R7989 Other specified abnormal findings of blood chemistry: Secondary | ICD-10-CM | POA: Diagnosis not present

## 2019-08-31 DIAGNOSIS — G7289 Other specified myopathies: Secondary | ICD-10-CM | POA: Diagnosis not present

## 2019-08-31 DIAGNOSIS — F1120 Opioid dependence, uncomplicated: Secondary | ICD-10-CM | POA: Diagnosis not present

## 2019-08-31 MED ORDER — BUPRENORPHINE HCL-NALOXONE HCL 8-2 MG SL FILM: ORAL_FILM | SUBLINGUAL | 1 refills | Status: DC

## 2019-08-31 MED FILL — FOLIC ACID 1 MG TABS: 1 | 90 days supply | Qty: 90 | Fill #0

## 2019-08-31 MED FILL — METHOTREXATE SODIUM 2.5 MG: 2.5 | 18 days supply | Qty: 12 | Fill #0

## 2019-08-31 NOTE — Assessment & Plan Note (Signed)
Opioid use disorder is stable.  Doing well on Suboxone films.  Takes 2-1/2 films per day.  Cravings well controlled.  No relapses.  I prescribed a 1 month supply to the [facility] outpatient pharmacy.

## 2019-08-31 NOTE — Progress Notes (Signed)
  [facility] Internal Medicine Residency Telephone Encounter Continuity Care Appointment  HPI:   This telephone encounter was created for [name] on 08/31/2019 for the following purpose/cc opioid use disorder.   Patient is doing fairly well today.  Since I last saw him his acute GI illness has resolved.  He feels back to normal, eating and drinking well.  Myopathies are well controlled right now.  I reviewed his note from January at the neurology clinic at [facility].  They started him on methotrexate, but there was some confusion about when to start that medication.  He thought he was waiting for labs to be done first.  It looks like they want those labs to be done at our clinic for convenience which is fine with me.  We went over the dosing of methotrexate, it sounds like he understands the potential side effects well.  He is hoping for improved myalgias with this medication.  Opioid cravings are under good control, reports good adherence with Suboxone dosing 8-9 films per day.  No side effects.  No cravings and no relapses.  Family life is going well, his wife is a Engineer, civil (consulting) who is active in treating people with Covid in our community.   Past Medical History:  Past Medical History:  Diagnosis Date  . Degenerative disc disease, lumbar   . Dental crown present   . History of kidney stones   . Hypertension    states under control with meds., has been on med. x 1 yr. - is currently out of med., to see PCP 02/06/2018  . Muscle atrophy 01/2018  . Muscle weakness 01/2018      ROS:      Assessment / Plan / Recommendations:   Please see A&P under problem oriented charting for assessment of the patient's acute and chronic medical conditions.   As always, pt is advised that if symptoms worsen or new symptoms arise, they should go to an urgent care facility or to to ER for further evaluation.   Consent and Medical Decision Making:    This is a telephone encounter between Kewan  T Radabaugh and [name] on 08/31/2019 for opioid use disorder. The visit was conducted with the patient located at home and [name] at [facility]. The patient's identity was confirmed using their DOB and current address. The patient has consented to being evaluated through a telephone encounter and understands the associated risks (an examination cannot be done and the patient may need to come in for an appointment) / benefits (allows the patient to remain at home, decreasing exposure to coronavirus). I personally spent 12 minutes on medical discussion.

## 2019-08-31 NOTE — Assessment & Plan Note (Signed)
Comanaged with neurology clinic at [facility], symptom burden seems stable right now.  In January the diagnosis was confirmed of a necrotizing myopathy, unknown underlying causative etiology, but highly suspicious for an autoimmune process.  His neurologist prescribed methotrexate to start at 10 mg once weekly for 2 weeks and then increase to 15 mg once weekly thereafter.  Also prescribed folic acid.  They want him to have lab monitoring done here, which is much more convenient for the patient.  Seems reasonable.  I placed orders for a CBC with differential, CMP, CK and lipids.  Once he starts methotrexate we will check labs every 1 to 2 months to monitor for CBC changes or liver dysfunction.  Hopefully this medication will help with his recurrent myopathies.

## 2019-08-31 NOTE — Assessment & Plan Note (Signed)
Primary hypogonadism with been on testosterone repletion for many years.  We are going to check a testosterone level tomorrow, last dose of testosterone was Saturday, February 13 so this should be right at about mid 20 points between his doses.  Goal total testosterone level is around 500-600, will adjust dose if needed.  The last testosterone level was very high because it was not timed well with his dose of testosterone.

## 2019-09-01 ENCOUNTER — Other Ambulatory Visit (INDEPENDENT_AMBULATORY_CARE_PROVIDER_SITE_OTHER): Payer: 59

## 2019-09-01 DIAGNOSIS — G7289 Other specified myopathies: Secondary | ICD-10-CM

## 2019-09-01 DIAGNOSIS — R7989 Other specified abnormal findings of blood chemistry: Secondary | ICD-10-CM

## 2019-09-01 DIAGNOSIS — R3589 Other polyuria: Secondary | ICD-10-CM

## 2019-09-01 DIAGNOSIS — R358 Other polyuria: Secondary | ICD-10-CM

## 2019-09-02 ENCOUNTER — Telehealth: Payer: Self-pay | Admitting: Student in an Organized Health Care Education/Training Program

## 2019-09-02 LAB — CBC WITH DIFFERENTIAL/PLATELET
Basophils Absolute: 0.1 10*3/uL (ref 0.0–0.2)
Basos: 1 %
EOS (ABSOLUTE): 0.2 10*3/uL (ref 0.0–0.4)
Eos: 2 %
Hematocrit: 44.7 % (ref 37.5–51.0)
Hemoglobin: 15.8 g/dL (ref 13.0–17.7)
Immature Grans (Abs): 0.1 10*3/uL (ref 0.0–0.1)
Immature Granulocytes: 1 %
Lymphocytes Absolute: 1.5 10*3/uL (ref 0.7–3.1)
Lymphs: 19 %
MCH: 32.4 pg (ref 26.6–33.0)
MCHC: 35.3 g/dL (ref 31.5–35.7)
MCV: 92 fL (ref 79–97)
Monocytes Absolute: 0.6 10*3/uL (ref 0.1–0.9)
Monocytes: 8 %
Neutrophils Absolute: 5.5 10*3/uL (ref 1.4–7.0)
Neutrophils: 69 %
Platelets: 202 10*3/uL (ref 150–450)
RBC: 4.87 x10E6/uL (ref 4.14–5.80)
RDW: 11.8 % (ref 11.6–15.4)
WBC: 7.8 10*3/uL (ref 3.4–10.8)

## 2019-09-02 LAB — CMP14 + ANION GAP
ALT: 54 IU/L — ABNORMAL HIGH (ref 0–44)
AST: 44 IU/L — ABNORMAL HIGH (ref 0–40)
Albumin/Globulin Ratio: 2.4 — ABNORMAL HIGH (ref 1.2–2.2)
Albumin: 4.4 g/dL (ref 3.8–4.9)
Alkaline Phosphatase: 63 IU/L (ref 39–117)
Anion Gap: 14 mmol/L (ref 10.0–18.0)
BUN/Creatinine Ratio: 31 — ABNORMAL HIGH (ref 9–20)
BUN: 21 mg/dL (ref 6–24)
Bilirubin Total: 0.4 mg/dL (ref 0.0–1.2)
CO2: 26 mmol/L (ref 20–29)
Calcium: 9.7 mg/dL (ref 8.7–10.2)
Chloride: 100 mmol/L (ref 96–106)
Creatinine, Ser: 0.67 mg/dL — ABNORMAL LOW (ref 0.76–1.27)
GFR calc Af Amer: 128 mL/min/{1.73_m2} (ref >59)
GFR calc non Af Amer: 110 mL/min/{1.73_m2} (ref >59)
Globulin, Total: 1.8 g/dL (ref 1.5–4.5)
Glucose: 98 mg/dL (ref 65–99)
Potassium: 4.1 mmol/L (ref 3.5–5.2)
Sodium: 140 mmol/L (ref 134–144)
Total Protein: 6.2 g/dL (ref 6.0–8.5)

## 2019-09-02 LAB — LIPID PANEL
Chol/HDL Ratio: 3.3 ratio (ref 0.0–5.0)
Cholesterol, Total: 202 mg/dL — ABNORMAL HIGH (ref 100–199)
HDL: 61 mg/dL (ref >39)
LDL Chol Calc (NIH): 111 mg/dL — ABNORMAL HIGH (ref 0–99)
Triglycerides: 172 mg/dL — ABNORMAL HIGH (ref 0–149)
VLDL Cholesterol Cal: 30 mg/dL (ref 5–40)

## 2019-09-02 LAB — CK: Total CK: 1053 U/L (ref 41–331)

## 2019-09-02 LAB — TESTOSTERONE: Testosterone: 103 ng/dL — ABNORMAL LOW (ref 264–916)

## 2019-09-02 LAB — PSA: Prostate Specific Ag, Serum: 0.6 ng/mL (ref 0.0–4.0)

## 2019-09-02 NOTE — Telephone Encounter (Signed)
I spoke with Joseph Ayala this morning. Labs look ok to start the methotrexate, which he has done. Testosterone is a little low, but this was drawn day 10 after dose, not 7, and he is already on a high dose, so will not change right now. Follow up with me in 6 weeks.

## 2019-09-04 NOTE — Telephone Encounter (Signed)
Yes, overbook is fine. Thank you.

## 2019-09-04 NOTE — Telephone Encounter (Signed)
Your Schedule is booked for the 1st week of April.  Please advise would you like for this patient to be overbooked.

## 2019-09-11 MED FILL — TESTOSTERONE CYP 200 MG/ML: 200 | 28 days supply | Qty: 4 | Fill #3

## 2019-09-14 MED FILL — BUPREN-NALOX FILM 8-2MG: 8-2 | 1 days supply | Qty: 2 | Fill #0

## 2019-09-15 ENCOUNTER — Encounter: Payer: Self-pay | Admitting: Internal Medicine

## 2019-09-15 ENCOUNTER — Ambulatory Visit: Payer: 59 | Admitting: Internal Medicine

## 2019-09-15 ENCOUNTER — Telehealth: Payer: Self-pay | Admitting: *Deleted

## 2019-09-15 VITALS — BP 142/104 | HR 102 | Temp 98.4°F | Ht 71.0 in | Wt 212.9 lb

## 2019-09-15 DIAGNOSIS — M25472 Effusion, left ankle: Secondary | ICD-10-CM | POA: Insufficient documentation

## 2019-09-15 DIAGNOSIS — R600 Localized edema: Secondary | ICD-10-CM | POA: Diagnosis not present

## 2019-09-15 DIAGNOSIS — M7989 Other specified soft tissue disorders: Secondary | ICD-10-CM | POA: Diagnosis not present

## 2019-09-15 DIAGNOSIS — I10 Essential (primary) hypertension: Secondary | ICD-10-CM | POA: Diagnosis not present

## 2019-09-15 MED FILL — BUPREN-NALOX FILM 8-2MG: 8-2 | 30 days supply | Qty: 60 | Fill #1

## 2019-09-15 NOTE — Telephone Encounter (Addendum)
Information was sent to CoverMyMeds/MedImpact for [place] for Buprenorphine HCL-Naloxone HCL 8-2MG  films.  Awaiting determination within 24 hours.  [phone].   Angelina Ok, RN 09/15/2019 9:39 AM. Call to [facility]-patient received medication.  Angelina Ok, RN 09/16/2019 10:21 AM                                        Buprenorphine approved 09/17/2019 thru 09/15/2020.  Angelina Ok, RN 09/23/2018   10:22 AM

## 2019-09-15 NOTE — Progress Notes (Signed)
   CC: left lower leg swelling  HPI:[name] is a 54 y.o. male who presents for evaluation of left lower leg swelling. Please see individual problem based A/P for details.  Past Medical History:  Diagnosis Date  . Degenerative disc disease, lumbar   . Dental crown present   . History of kidney stones   . Hypertension    states under control with meds., has been on med. x 1 yr. - is currently out of med., to see PCP 02/06/2018  . Muscle atrophy 01/2018  . Muscle weakness 01/2018   Review of Systems:  ROS negative except as per HPI.  Physical Exam: Vitals:   09/15/19 1509  BP: (!) 142/104  Pulse: (!) 102  Temp: 98.4 F (36.9 C)  TempSrc: Oral  SpO2: 96%  Weight: 212 lb 14.4 oz (96.6 kg)  Height: 5\' 11"  (1.803 m)    General: Alert, nl appearance HE: Normocephalic, atraumatic , EOMI, Conjunctivae normal Pulmonary: effort normal, no respiratory stress Musculoskeletal: mild edema of left ankle, tibial measurement 40 cm on right and left , no tenderness in extremities, Skin: Warm, dry , mild erythema on left ankle, no rash Psychiatric/Behavioral:  normal mood, normal behavior   Assessment & Plan:   See Encounters Tab for problem based charting.  Patient seen with Dr. 

## 2019-09-15 NOTE — Telephone Encounter (Signed)
Pt calls and states his L lower leg has been swollen, painful, red and hot for 1+ week, ACC today at 1430.

## 2019-09-15 NOTE — Assessment & Plan Note (Signed)
Left lower leg edema: Patient present with about a week of left lower extremity edema and mild erythema. He denies any trauma to the area, previous blood clots, or recent travel. Exam does not suggest DVT. Very mild erythema of left anklle. Tibial leg measurement equal . Dr.Vincent evaluated patient with 5 point lower extremity US. I appreciated compression of the veins from femoral region down through the popliteal region..  The area of erythema was mild and not hot. Patient says he has been wearing a left knee brace and had it on until last night around 1 am.   Likely patient's edema and mild erythema of left ankle is from knee brace causing compression of veins.   Plan Patient given instruction to keep knee brace off and elevate his leg above his heart  when resting.

## 2019-09-15 NOTE — Patient Instructions (Signed)
Thank you for trusting [place] with your care. To recap, today we discussed the following:   Left leg swelling -Evaluated your left leg with ultrasound and found no DVT or swollen lymph nodes.  So we do not believe you have a DVT or cellulitis.  It is possible your knee brace compressed veins and this is why your left lower leg was swollen.  You can elevate your leg at night and watch it for the next few days for improvement.  My best,  Thurmon Fair, MD

## 2019-09-15 NOTE — Assessment & Plan Note (Signed)
Patient reports he was on HCTZ and amlodipine in the past. He has brought his records from his prior doctor for his PCP to review.  Patient's BP today is 142/104. Patient is here for an acute care visit. Will defer to PCP, patient has a visit scheduled for less than a month.

## 2019-09-15 NOTE — Telephone Encounter (Signed)
Thank you. I agree. Will eval for DVT vs cellulitis

## 2019-09-16 NOTE — Progress Notes (Signed)
Internal Medicine Clinic Attending  I saw and evaluated the patient.  I personally confirmed the key portions of the history and exam documented by Dr. Barbaraann Faster and I reviewed pertinent patient test results.  The assessment, diagnosis, and plan were formulated together and I agree with the documentation in the resident's note.   Acute visit for left lower leg swelling and redness. On my exam, the patient has trace edema bilaterally, no significant difference left or right. Both legs have equal circumference at 40cm. There is very mild redness around the ankle, no cellulitis, there are mild varicose veins below the knee. Overall seems low risk for DVT, only risk factor is testosterone supplement, but he is very active at home. We did an ultrasound in the office which showed fully compressible veins in the proximal thigh to the popliteal, and even the varicose veins below the knee were compressible. It is possible that the knee brace he has been wearing is too tight. Would monitor this conservatively.

## 2019-10-09 MED FILL — BUPREN-NALOX FILM 8-2MG: 8-2 | 30 days supply | Qty: 60 | Fill #2

## 2019-10-12 ENCOUNTER — Other Ambulatory Visit: Payer: Self-pay

## 2019-10-12 ENCOUNTER — Encounter: Payer: 59 | Admitting: Student in an Organized Health Care Education/Training Program

## 2019-10-16 MED FILL — TESTOSTERONE CYP 200 MG/ML: 200 | 28 days supply | Qty: 4 | Fill #4

## 2019-11-06 MED FILL — BUPREN-NALOX FILM 8-2MG: 8-2 | 14 days supply | Qty: 28 | Fill #3

## 2019-11-25 ENCOUNTER — Other Ambulatory Visit: Payer: Self-pay | Admitting: Student in an Organized Health Care Education/Training Program

## 2019-11-25 DIAGNOSIS — F1120 Opioid dependence, uncomplicated: Secondary | ICD-10-CM

## 2019-11-25 MED ORDER — BUPRENORPHINE HCL-NALOXONE HCL 8-2 MG SL FILM: ORAL_FILM | SUBLINGUAL | 1 refills | Status: DC

## 2019-11-25 MED FILL — BUPREN-NALOX FILM 8-2MG: 8-2 | 30 days supply | Qty: 75 | Fill #0

## 2019-11-25 NOTE — Telephone Encounter (Signed)
Refill approved. Can you arrange for him to see me in my continuity clinic for a visit within the next 2 months?

## 2019-12-16 ENCOUNTER — Other Ambulatory Visit: Payer: Self-pay | Admitting: Student in an Organized Health Care Education/Training Program

## 2019-12-25 MED FILL — BUPREN-NALOX FILM 8-2MG: 8-2 | 30 days supply | Qty: 75 | Fill #1

## 2020-01-18 ENCOUNTER — Other Ambulatory Visit: Payer: Self-pay | Admitting: Student in an Organized Health Care Education/Training Program

## 2020-01-18 DIAGNOSIS — F1120 Opioid dependence, uncomplicated: Secondary | ICD-10-CM

## 2020-01-18 MED ORDER — BUPRENORPHINE HCL-NALOXONE HCL 8-2 MG SL FILM: ORAL_FILM | SUBLINGUAL | 1 refills | Status: DC

## 2020-01-18 MED FILL — TESTOSTERONE CYP 200 MG/ML: 200 | 28 days supply | Qty: 4 | Fill #1

## 2020-01-18 NOTE — Addendum Note (Signed)
Addended by: Erlinda Hong T on: 01/18/2020 10:21 AM   Modules accepted: Orders

## 2020-01-22 MED FILL — BUPREN-NALOX FILM 8-2MG: 8-2 | 30 days supply | Qty: 75 | Fill #0

## 2020-02-01 ENCOUNTER — Encounter: Payer: 59 | Admitting: Student in an Organized Health Care Education/Training Program

## 2020-02-17 MED FILL — TESTOSTERONE CYP 200 MG/ML: 200 | 28 days supply | Qty: 4 | Fill #2

## 2020-02-23 MED FILL — BUPREN-NALOX FILM 8-2MG: 8-2 | 30 days supply | Qty: 75 | Fill #1

## 2020-02-29 ENCOUNTER — Other Ambulatory Visit: Payer: Self-pay

## 2020-02-29 ENCOUNTER — Encounter: Payer: 59 | Admitting: Student in an Organized Health Care Education/Training Program

## 2020-03-18 MED FILL — TESTOSTERONE CYP 200 MG/ML: 200 | 28 days supply | Qty: 4 | Fill #3

## 2020-03-25 ENCOUNTER — Other Ambulatory Visit: Payer: Self-pay | Admitting: Student in an Organized Health Care Education/Training Program

## 2020-03-25 DIAGNOSIS — F1120 Opioid dependence, uncomplicated: Secondary | ICD-10-CM

## 2020-03-25 MED FILL — BUPRENORPHINE HCL-NALOXONE: 8-2 | 30 days supply | Qty: 75 | Fill #0

## 2020-04-11 ENCOUNTER — Encounter: Payer: Self-pay | Admitting: Student in an Organized Health Care Education/Training Program

## 2020-04-11 ENCOUNTER — Ambulatory Visit (INDEPENDENT_AMBULATORY_CARE_PROVIDER_SITE_OTHER): Payer: 59 | Admitting: Student in an Organized Health Care Education/Training Program

## 2020-04-11 ENCOUNTER — Other Ambulatory Visit (HOSPITAL_COMMUNITY): Payer: Self-pay | Admitting: Student in an Organized Health Care Education/Training Program

## 2020-04-11 VITALS — BP 134/94 | HR 100 | Temp 98.2°F | Ht 71.0 in | Wt 204.5 lb

## 2020-04-11 DIAGNOSIS — R7989 Other specified abnormal findings of blood chemistry: Secondary | ICD-10-CM | POA: Diagnosis not present

## 2020-04-11 DIAGNOSIS — E291 Testicular hypofunction: Secondary | ICD-10-CM | POA: Diagnosis not present

## 2020-04-11 DIAGNOSIS — F1120 Opioid dependence, uncomplicated: Secondary | ICD-10-CM

## 2020-04-11 DIAGNOSIS — G7289 Other specified myopathies: Secondary | ICD-10-CM

## 2020-04-11 DIAGNOSIS — F909 Attention-deficit hyperactivity disorder, unspecified type: Secondary | ICD-10-CM

## 2020-04-11 DIAGNOSIS — F1010 Alcohol abuse, uncomplicated: Secondary | ICD-10-CM | POA: Insufficient documentation

## 2020-04-11 MED ORDER — BUPRENORPHINE HCL-NALOXONE HCL 8-2 MG SL FILM: ORAL_FILM | SUBLINGUAL | 1 refills | Status: DC

## 2020-04-11 MED ORDER — ATOMOXETINE HCL 40 MG PO CAPS: ORAL_CAPSULE | ORAL | 2 refills | Status: DC

## 2020-04-11 MED FILL — ATOMOXETINE HCL 40 MG CAPS: 40 | 15 days supply | Qty: 30 | Fill #0

## 2020-04-11 NOTE — Assessment & Plan Note (Signed)
Symptomatically stable.  Reportedly no relapses.  Reportedly good adherence to medications.  Plan to check urine tox assure today.  Continue with Suboxone films 20 mg daily in divided doses.

## 2020-04-11 NOTE — Assessment & Plan Note (Signed)
I am impressed by how significant his ADHD has become this last year.  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  This is putting a strain on his marriage, finances, and ultimately his treatment of his other medical conditions.  In the past I have referred him to an ADHD specialist, but he was unable to complete that referral.  At this time I am going to start treatment with Strattera 40 mg daily for 3 days and then uptitrate to 80 mg daily.  Going to avoid stimulants given his comorbid substance use disorder.  Counseled him to stop using Adderall from nonprescribed sources.

## 2020-04-11 NOTE — Patient Instructions (Addendum)
Is good seeing you today in clinic.  We talked about opioid use disorder, we will continue with Suboxone as prescribed.  Please be sure to follow-up with [place] in the office every 2-3 months for this problem.  We talked about your inflammatory muscle disease, please follow-up with the neurologist as soon as able for long-term treatment strategy.  We talked about ADHD, I am going to call you later this week to talk about treatment options.  We talked about healthy limitations on alcohol.

## 2020-04-11 NOTE — Assessment & Plan Note (Signed)
Diagnosed by a neurologist at [facility].  Tried on methotrexate in the spring, quickly did not tolerate it due to side effects.  I encouraged him to follow back up with his neurologist to consider other immunosuppressive agents.  I discouraged the use of prednisone, especially from nonprescribed sources.  We will check a CK today.

## 2020-04-11 NOTE — Progress Notes (Signed)
° °  Assessment and Plan:  See Encounters tab for problem-based medical decision making.   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  Patient was 30 minutes late for our visit today, he missed our last visit, reports a lot of difficulty with completing tasks the last few months.  Reports that he cannot keep his attention on one thing, he is able to start tasks but then has difficulty completing them because he gets distracted.  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  Also caused some stress within his marriage.  He reports good adherence to Suboxone, reports cravings are well controlled for opioids, denies any relapses.  2 of his close friends have died of suspected overdoses within the last few months.  He denies any depressed mood.  Denies any recent illnesses, no fevers or chills, no ED visits or hospitalizations.  __________________________________________________________  Problem List: Patient Active Problem List   Diagnosis Date Noted   Moderate opioid use disorder (HCC) 09/11/2018    Priority: High   Necrotizing myopathy 12/27/2017    Priority: High   Hypertension 07/13/2019    Priority: Medium   Chronic pain syndrome 10/22/2017    Priority: Medium   Degeneration of lumbar intervertebral disc 09/25/2017    Priority: Medium   Lumbar spondylosis 09/25/2017    Priority: Medium   At Risk Alcohol Use 04/11/2020    Priority: Low   ADHD 06/29/2019    Priority: Low   Low testosterone 06/01/2019    Priority: Low    Medications: Reconciled today in Epic __________________________________________________________  Physical Exam:  Vital Signs: Vitals:   04/11/20 1117 04/11/20 1140  BP: (!) 150/102 (!) 134/94  Pulse: (!) 108 100  Temp: 98.2 F (36.8 C)   TempSrc: Oral   SpO2: 96%   Weight: 204 lb 8 oz (92.8 kg)    Height: 5\' 11"  (1.803 m)     Gen: Well appearing, NAD Neck: No cervical LAD, No thyromegaly or nodules, No JVD. CV: RRR, no murmurs Pulm: Normal effort, CTA throughout, no wheezing Ext: Warm, no edema, normal joints

## 2020-04-11 NOTE — Assessment & Plan Note (Signed)
Symptomatically doing well.  We have had challenges in the past with monitoring testosterone levels due to difficulty following up for lab draws at the right time with his dosing.  We will check a CBC today to rule out thrombocytosis.  Continue with testosterone dosing.

## 2020-04-11 NOTE — Assessment & Plan Note (Signed)
Reports currently drinking between 18 and 24 beers per week, some days he clearly has too many drinks in 1 sitting.  No signs of liver disease on his exam.  No formal diagnosis of alcohol use disorder.  Plan to check liver functions today.  I counseled him on what we considered to be healthy limits to alcohol use.

## 2020-04-12 ENCOUNTER — Other Ambulatory Visit (HOSPITAL_COMMUNITY): Payer: Self-pay | Admitting: Student in an Organized Health Care Education/Training Program

## 2020-04-12 ENCOUNTER — Telehealth: Payer: Self-pay | Admitting: Student in an Organized Health Care Education/Training Program

## 2020-04-12 LAB — CBC
Hematocrit: 46.3 % (ref 37.5–51.0)
Hemoglobin: 15.6 g/dL (ref 13.0–17.7)
MCH: 31.3 pg (ref 26.6–33.0)
MCHC: 33.7 g/dL (ref 31.5–35.7)
MCV: 93 fL (ref 79–97)
Platelets: 205 10*3/uL (ref 150–450)
RBC: 4.99 x10E6/uL (ref 4.14–5.80)
RDW: 13.3 % (ref 11.6–15.4)
WBC: 12.3 10*3/uL — ABNORMAL HIGH (ref 3.4–10.8)

## 2020-04-12 LAB — CK: Total CK: 1417 U/L (ref 41–331)

## 2020-04-12 LAB — TSH: TSH: 1.5 u[IU]/mL (ref 0.450–4.500)

## 2020-04-12 LAB — CMP14 + ANION GAP
ALT: 74 IU/L — ABNORMAL HIGH (ref 0–44)
AST: 70 IU/L — ABNORMAL HIGH (ref 0–40)
Albumin/Globulin Ratio: 2.2 (ref 1.2–2.2)
Albumin: 4.3 g/dL (ref 3.8–4.9)
Alkaline Phosphatase: 57 IU/L (ref 44–121)
Anion Gap: 14 mmol/L (ref 10.0–18.0)
BUN/Creatinine Ratio: 26 — ABNORMAL HIGH (ref 9–20)
BUN: 21 mg/dL (ref 6–24)
Bilirubin Total: 0.4 mg/dL (ref 0.0–1.2)
CO2: 25 mmol/L (ref 20–29)
Calcium: 9.7 mg/dL (ref 8.7–10.2)
Chloride: 100 mmol/L (ref 96–106)
Creatinine, Ser: 0.82 mg/dL (ref 0.76–1.27)
GFR calc Af Amer: 118 mL/min/{1.73_m2} (ref >59)
GFR calc non Af Amer: 102 mL/min/{1.73_m2} (ref >59)
Globulin, Total: 2 g/dL (ref 1.5–4.5)
Glucose: 77 mg/dL (ref 65–99)
Potassium: 4.1 mmol/L (ref 3.5–5.2)
Sodium: 139 mmol/L (ref 134–144)
Total Protein: 6.3 g/dL (ref 6.0–8.5)

## 2020-04-12 MED ORDER — PREDNISONE 20 MG PO TABS
ORAL_TABLET | ORAL | 0 refills | Status: AC
Start: 2020-04-12 — End: 2020-05-02

## 2020-04-12 MED FILL — predniSONE 20 MG TABS: 20 | 20 days supply | Qty: 33 | Fill #0

## 2020-04-12 NOTE — Telephone Encounter (Signed)
Patient with a history of autoimmune necrotizing myositis, off treatments for several months due to side effects from rituximab.  Now with worsening proximal thigh pain.  CK levels are up over 1400 at this point.  I am going to treat this with another burst of prednisone, its been several months since he has done this.  Trying to avoid significant muscle damage or elevated CKs to a level that put him at risk for pigment associated nephropathy.  Will give prednisone 60 mg with a taper every 5 days.  I called and left a voicemail with these instructions for him.

## 2020-04-15 LAB — TOXASSURE SELECT,+ANTIDEPR,UR

## 2020-04-22 ENCOUNTER — Other Ambulatory Visit: Payer: Self-pay | Admitting: Student in an Organized Health Care Education/Training Program

## 2020-05-05 ENCOUNTER — Telehealth: Payer: Self-pay

## 2020-05-05 MED ORDER — TESTOSTERONE CYPIONATE 200 MG/ML IM SOLN
400.0000 mg | INTRAMUSCULAR | 3 refills | Status: DC
Start: 2020-05-05 — End: 2020-09-29

## 2020-05-05 NOTE — Telephone Encounter (Signed)
Returned call to patient. He is requesting testosterone be sent to Karin Golden at [facility] as they will accept the GoodRx coupon. Kinnie Feil, BSN, RN-BC

## 2020-05-05 NOTE — Telephone Encounter (Signed)
[facility] not accept GOODRx coupon on testosterone cypionate (DEPOTESTOSTERONE CYPIONATE) 200 MG/ML injection. Please call pt back.

## 2020-05-30 MED FILL — BUPRENORPHINE HCL-NALOXONE: 8-2 | 30 days supply | Qty: 75 | Fill #0

## 2020-06-06 ENCOUNTER — Encounter: Payer: 59 | Admitting: Student in an Organized Health Care Education/Training Program

## 2020-06-20 ENCOUNTER — Other Ambulatory Visit: Payer: Self-pay

## 2020-06-20 ENCOUNTER — Other Ambulatory Visit (HOSPITAL_COMMUNITY): Payer: Self-pay | Admitting: Student in an Organized Health Care Education/Training Program

## 2020-06-20 ENCOUNTER — Ambulatory Visit (INDEPENDENT_AMBULATORY_CARE_PROVIDER_SITE_OTHER): Payer: Medicare HMO | Admitting: Student in an Organized Health Care Education/Training Program

## 2020-06-20 ENCOUNTER — Encounter: Payer: Self-pay | Admitting: Student in an Organized Health Care Education/Training Program

## 2020-06-20 DIAGNOSIS — F1120 Opioid dependence, uncomplicated: Secondary | ICD-10-CM | POA: Diagnosis not present

## 2020-06-20 DIAGNOSIS — F909 Attention-deficit hyperactivity disorder, unspecified type: Secondary | ICD-10-CM

## 2020-06-20 DIAGNOSIS — R7989 Other specified abnormal findings of blood chemistry: Secondary | ICD-10-CM

## 2020-06-20 DIAGNOSIS — Z0000 Encounter for general adult medical examination without abnormal findings: Secondary | ICD-10-CM

## 2020-06-20 DIAGNOSIS — Z4682 Encounter for fitting and adjustment of non-vascular catheter: Secondary | ICD-10-CM | POA: Insufficient documentation

## 2020-06-20 MED ORDER — BUPRENORPHINE HCL-NALOXONE HCL 8-2 MG SL FILM: ORAL_FILM | SUBLINGUAL | 1 refills | Status: DC

## 2020-06-20 NOTE — Progress Notes (Signed)
  [name] Internal Medicine Residency Telephone Encounter Continuity Care Appointment  HPI:   This telephone encounter was created for [name] on 06/20/2020 for the following purpose/cc opioid use disorder.  54 year old person with longstanding opioid use disorder and a necrotizing myositis currently not on treatment.  I last saw the patient in clinic in October.  We started him on Strattera at that time to manage ADHD which was interfering with his life.  Reports doing okay on the medication for a few weeks, however noticed some side effects including upset stomach and increasing muscle and joint aches.  He discontinued the medication after about a month, did note some improvement in his attention.  Currently only taking testosterone injection every 2 weeks, last dosed yesterday.  Also takes Suboxone film 2-1/2/day.  Reports good adherence with that medication, good benefit, well-controlled cravings, no use of oxycodone or other opioids.  No adverse side effects.  He continues to have some diffuse muscle aches related to his autoimmune myositis.  He has an appointment for neurology at [facility] coming up in February.  In the past has been unable to tolerate methotrexate due to side effects.  He has had numerous courses of prednisone which temporarily benefit him.  Last CK check was 1400 in October.  He is planning to follow-up with his neurologist and try to get in earlier if able.  We talked about the risks of using human growth hormone, which she has experimented with in the past to help with muscle bulk.  Additionally he is playing a 8-day vacation to the [place] for the end of December with his wife.  We talked about safe travel with his medications.   Past Medical History:  Past Medical History:  Diagnosis Date  . Degenerative disc disease, lumbar   . Dental crown present   . History of kidney stones   . Hypertension    states under control with meds., has been on  med. x 1 yr. - is currently out of med., to see PCP 02/06/2018  . Muscle atrophy 01/2018  . Muscle weakness 01/2018      ROS:   Mild sinus congestion, no fevers or chills   Assessment / Plan / Recommendations:   Please see A&P under problem oriented charting for assessment of the patient's acute and chronic medical conditions.   As always, pt is advised that if symptoms worsen or new symptoms arise, they should go to an urgent care facility or to to ER for further evaluation.   Consent and Medical Decision Making:    This is a telephone encounter between [name] and [name] on 06/20/2020 for opioid use disorder. The visit was conducted with the patient located at home and [name] at [facility]. The patient's identity was confirmed using their DOB and current address. The patient has consented to being evaluated through a telephone encounter and understands the associated risks (an examination cannot be done and the patient may need to come in for an appointment) / benefits (allows the patient to remain at home, decreasing exposure to coronavirus). I personally spent 15 minutes on medical discussion.

## 2020-06-20 NOTE — Assessment & Plan Note (Signed)
Patient with longstanding ADHD complicated by comorbid substance use disorder.  2 months ago we started him on Strattera, reported some benefit regarding his attention however he has upset stomach and increasing muscle aches so he discontinued after 1 month.  We talked about limitations in treating with amphetamines given the ongoing substance use disorder. I offered him bupropion as the next step, patient declined, says the ADHD is not function limiting at this time and he would rather do watchful waiting for now.

## 2020-06-20 NOTE — Assessment & Plan Note (Addendum)
Stable symptoms, tolerating testosterone well.  He does in injection every 2 weeks, last one was done yesterday on Sunday.  We are due for monitoring lab, we have had trouble timing this midway through his cycle in the past which has made interpretation of the values difficult.  We are planning to do a lab draw for this testosterone level Monday 12/20.

## 2020-06-20 NOTE — Assessment & Plan Note (Signed)
Up-to-date on his COVID-19 vaccination, though his eligible for a third booster vaccine.  I recommended this for him especially as he is having some international travel coming up at the end of the month.  We will also provide him with a flu vaccine when he comes for his lab visit next week.  Briefly discussed shingles vaccination, decided to hold off for now in favor of the other 2 vaccines.  Also may be due for colon cancer screening, I need to look into this more from his old records.

## 2020-06-20 NOTE — Assessment & Plan Note (Signed)
Moderate opioid use disorder, currently well controlled on daily Suboxone.  No relapses with oxycodone.  Tolerating the medicine well with no adverse side effects.  I reviewed the database which was appropriate.  Urine tox assure at last visit was appropriate regarding the opioids.  Plan to continue with treatment with Suboxone 2 and 1/2 films daily.  2 months of refills sent to the [facility] outpatient pharmacy.

## 2020-06-28 MED FILL — BUPRENORPHINE HCL-NALOXONE: 8-2 | 30 days supply | Qty: 75 | Fill #0

## 2020-07-26 MED FILL — BUPRENORPHINE HCL-NALOXONE: 8-2 | 30 days supply | Qty: 75 | Fill #1

## 2020-08-23 NOTE — Addendum Note (Signed)
Addended by: Erlinda Hong T on: 08/23/2020 02:46 PM   Modules accepted: Orders

## 2020-08-25 MED FILL — BUPRENORPHINE HCL-NALOXONE: 8-2 | 30 days supply | Qty: 75 | Fill #1

## 2020-08-27 ENCOUNTER — Emergency Department (HOSPITAL_BASED_OUTPATIENT_CLINIC_OR_DEPARTMENT_OTHER): Payer: Medicare HMO

## 2020-08-27 ENCOUNTER — Encounter (HOSPITAL_BASED_OUTPATIENT_CLINIC_OR_DEPARTMENT_OTHER): Payer: Self-pay | Admitting: Emergency Medicine

## 2020-08-27 ENCOUNTER — Other Ambulatory Visit: Payer: Self-pay

## 2020-08-27 ENCOUNTER — Inpatient Hospital Stay (HOSPITAL_BASED_OUTPATIENT_CLINIC_OR_DEPARTMENT_OTHER)
Admission: EM | Admit: 2020-08-27 | Discharge: 2020-09-29 | DRG: 004 | Disposition: A | Discharge status: Rehabilitation Facility | Payer: Medicare HMO | Attending: Internal Medicine | Admitting: Internal Medicine

## 2020-08-27 DIAGNOSIS — D638 Anemia in other chronic diseases classified elsewhere: Secondary | ICD-10-CM | POA: Diagnosis not present

## 2020-08-27 DIAGNOSIS — J850 Gangrene and necrosis of lung: Secondary | ICD-10-CM

## 2020-08-27 DIAGNOSIS — Z9911 Dependence on respirator [ventilator] status: Secondary | ICD-10-CM | POA: Diagnosis not present

## 2020-08-27 DIAGNOSIS — F10239 Alcohol dependence with withdrawal, unspecified: Secondary | ICD-10-CM | POA: Diagnosis not present

## 2020-08-27 DIAGNOSIS — J1520 Pneumonia due to staphylococcus, unspecified: Secondary | ICD-10-CM | POA: Diagnosis not present

## 2020-08-27 DIAGNOSIS — M609 Myositis, unspecified: Secondary | ICD-10-CM | POA: Diagnosis present

## 2020-08-27 DIAGNOSIS — G729 Myopathy, unspecified: Secondary | ICD-10-CM | POA: Diagnosis not present

## 2020-08-27 DIAGNOSIS — S80212A Abrasion, left knee, initial encounter: Secondary | ICD-10-CM | POA: Diagnosis not present

## 2020-08-27 DIAGNOSIS — B9562 Methicillin resistant Staphylococcus aureus infection as the cause of diseases classified elsewhere: Secondary | ICD-10-CM | POA: Diagnosis not present

## 2020-08-27 DIAGNOSIS — R079 Chest pain, unspecified: Secondary | ICD-10-CM | POA: Diagnosis not present

## 2020-08-27 DIAGNOSIS — I358 Other nonrheumatic aortic valve disorders: Secondary | ICD-10-CM | POA: Diagnosis present

## 2020-08-27 DIAGNOSIS — I248 Other forms of acute ischemic heart disease: Secondary | ICD-10-CM | POA: Diagnosis present

## 2020-08-27 DIAGNOSIS — N2889 Other specified disorders of kidney and ureter: Secondary | ICD-10-CM | POA: Diagnosis not present

## 2020-08-27 DIAGNOSIS — J9601 Acute respiratory failure with hypoxia: Secondary | ICD-10-CM | POA: Diagnosis not present

## 2020-08-27 DIAGNOSIS — G5790 Unspecified mononeuropathy of unspecified lower limb: Secondary | ICD-10-CM | POA: Diagnosis present

## 2020-08-27 DIAGNOSIS — Z01818 Encounter for other preprocedural examination: Secondary | ICD-10-CM | POA: Diagnosis not present

## 2020-08-27 DIAGNOSIS — J690 Pneumonitis due to inhalation of food and vomit: Secondary | ICD-10-CM | POA: Diagnosis not present

## 2020-08-27 DIAGNOSIS — I77810 Thoracic aortic ectasia: Secondary | ICD-10-CM | POA: Diagnosis not present

## 2020-08-27 DIAGNOSIS — I700 Atherosclerosis of aorta: Secondary | ICD-10-CM | POA: Diagnosis present

## 2020-08-27 DIAGNOSIS — R410 Disorientation, unspecified: Secondary | ICD-10-CM | POA: Diagnosis not present

## 2020-08-27 DIAGNOSIS — I5041 Acute combined systolic (congestive) and diastolic (congestive) heart failure: Secondary | ICD-10-CM | POA: Diagnosis not present

## 2020-08-27 DIAGNOSIS — D539 Nutritional anemia, unspecified: Secondary | ICD-10-CM | POA: Diagnosis present

## 2020-08-27 DIAGNOSIS — I38 Endocarditis, valve unspecified: Secondary | ICD-10-CM | POA: Diagnosis not present

## 2020-08-27 DIAGNOSIS — I2584 Coronary atherosclerosis due to calcified coronary lesion: Secondary | ICD-10-CM | POA: Diagnosis present

## 2020-08-27 DIAGNOSIS — R1310 Dysphagia, unspecified: Secondary | ICD-10-CM | POA: Diagnosis not present

## 2020-08-27 DIAGNOSIS — F05 Delirium due to known physiological condition: Secondary | ICD-10-CM | POA: Diagnosis not present

## 2020-08-27 DIAGNOSIS — N179 Acute kidney failure, unspecified: Secondary | ICD-10-CM | POA: Diagnosis not present

## 2020-08-27 DIAGNOSIS — G928 Other toxic encephalopathy: Secondary | ICD-10-CM | POA: Diagnosis not present

## 2020-08-27 DIAGNOSIS — E162 Hypoglycemia, unspecified: Secondary | ICD-10-CM | POA: Diagnosis present

## 2020-08-27 DIAGNOSIS — Z79899 Other long term (current) drug therapy: Secondary | ICD-10-CM

## 2020-08-27 DIAGNOSIS — F1510 Other stimulant abuse, uncomplicated: Secondary | ICD-10-CM | POA: Diagnosis present

## 2020-08-27 DIAGNOSIS — R7401 Elevation of levels of liver transaminase levels: Secondary | ICD-10-CM | POA: Diagnosis not present

## 2020-08-27 DIAGNOSIS — R339 Retention of urine, unspecified: Secondary | ICD-10-CM | POA: Diagnosis not present

## 2020-08-27 DIAGNOSIS — I4891 Unspecified atrial fibrillation: Secondary | ICD-10-CM | POA: Diagnosis not present

## 2020-08-27 DIAGNOSIS — Z0189 Encounter for other specified special examinations: Secondary | ICD-10-CM

## 2020-08-27 DIAGNOSIS — R918 Other nonspecific abnormal finding of lung field: Secondary | ICD-10-CM | POA: Diagnosis not present

## 2020-08-27 DIAGNOSIS — J189 Pneumonia, unspecified organism: Secondary | ICD-10-CM

## 2020-08-27 DIAGNOSIS — E876 Hypokalemia: Secondary | ICD-10-CM | POA: Diagnosis not present

## 2020-08-27 DIAGNOSIS — I361 Nonrheumatic tricuspid (valve) insufficiency: Secondary | ICD-10-CM | POA: Diagnosis not present

## 2020-08-27 DIAGNOSIS — M7989 Other specified soft tissue disorders: Secondary | ICD-10-CM | POA: Diagnosis not present

## 2020-08-27 DIAGNOSIS — K4090 Unilateral inguinal hernia, without obstruction or gangrene, not specified as recurrent: Secondary | ICD-10-CM | POA: Diagnosis not present

## 2020-08-27 DIAGNOSIS — J9600 Acute respiratory failure, unspecified whether with hypoxia or hypercapnia: Secondary | ICD-10-CM | POA: Diagnosis not present

## 2020-08-27 DIAGNOSIS — G9340 Encephalopathy, unspecified: Secondary | ICD-10-CM | POA: Diagnosis not present

## 2020-08-27 DIAGNOSIS — G7281 Critical illness myopathy: Secondary | ICD-10-CM | POA: Diagnosis present

## 2020-08-27 DIAGNOSIS — E871 Hypo-osmolality and hyponatremia: Secondary | ICD-10-CM | POA: Diagnosis present

## 2020-08-27 DIAGNOSIS — J9819 Other pulmonary collapse: Secondary | ICD-10-CM | POA: Diagnosis not present

## 2020-08-27 DIAGNOSIS — I2510 Atherosclerotic heart disease of native coronary artery without angina pectoris: Secondary | ICD-10-CM | POA: Diagnosis present

## 2020-08-27 DIAGNOSIS — A4102 Sepsis due to Methicillin resistant Staphylococcus aureus: Secondary | ICD-10-CM | POA: Diagnosis not present

## 2020-08-27 DIAGNOSIS — R509 Fever, unspecified: Secondary | ICD-10-CM | POA: Diagnosis not present

## 2020-08-27 DIAGNOSIS — R0602 Shortness of breath: Secondary | ICD-10-CM

## 2020-08-27 DIAGNOSIS — Z452 Encounter for adjustment and management of vascular access device: Secondary | ICD-10-CM

## 2020-08-27 DIAGNOSIS — Y848 Other medical procedures as the cause of abnormal reaction of the patient, or of later complication, without mention of misadventure at the time of the procedure: Secondary | ICD-10-CM | POA: Diagnosis not present

## 2020-08-27 DIAGNOSIS — F1020 Alcohol dependence, uncomplicated: Secondary | ICD-10-CM | POA: Diagnosis present

## 2020-08-27 DIAGNOSIS — Z515 Encounter for palliative care: Secondary | ICD-10-CM

## 2020-08-27 DIAGNOSIS — J432 Centrilobular emphysema: Secondary | ICD-10-CM | POA: Diagnosis present

## 2020-08-27 DIAGNOSIS — F909 Attention-deficit hyperactivity disorder, unspecified type: Secondary | ICD-10-CM | POA: Diagnosis not present

## 2020-08-27 DIAGNOSIS — R570 Cardiogenic shock: Secondary | ICD-10-CM | POA: Diagnosis not present

## 2020-08-27 DIAGNOSIS — S80211A Abrasion, right knee, initial encounter: Secondary | ICD-10-CM | POA: Diagnosis not present

## 2020-08-27 DIAGNOSIS — M792 Neuralgia and neuritis, unspecified: Secondary | ICD-10-CM | POA: Diagnosis not present

## 2020-08-27 DIAGNOSIS — J9621 Acute and chronic respiratory failure with hypoxia: Secondary | ICD-10-CM | POA: Diagnosis not present

## 2020-08-27 DIAGNOSIS — J15212 Pneumonia due to Methicillin resistant Staphylococcus aureus: Secondary | ICD-10-CM | POA: Diagnosis not present

## 2020-08-27 DIAGNOSIS — I5020 Unspecified systolic (congestive) heart failure: Secondary | ICD-10-CM | POA: Diagnosis not present

## 2020-08-27 DIAGNOSIS — Z4682 Encounter for fitting and adjustment of non-vascular catheter: Secondary | ICD-10-CM

## 2020-08-27 DIAGNOSIS — J90 Pleural effusion, not elsewhere classified: Secondary | ICD-10-CM

## 2020-08-27 DIAGNOSIS — F1190 Opioid use, unspecified, uncomplicated: Secondary | ICD-10-CM | POA: Diagnosis present

## 2020-08-27 DIAGNOSIS — R1110 Vomiting, unspecified: Secondary | ICD-10-CM | POA: Diagnosis not present

## 2020-08-27 DIAGNOSIS — A419 Sepsis, unspecified organism: Secondary | ICD-10-CM | POA: Diagnosis present

## 2020-08-27 DIAGNOSIS — M6281 Muscle weakness (generalized): Secondary | ICD-10-CM | POA: Diagnosis present

## 2020-08-27 DIAGNOSIS — I471 Supraventricular tachycardia: Secondary | ICD-10-CM | POA: Diagnosis present

## 2020-08-27 DIAGNOSIS — R109 Unspecified abdominal pain: Secondary | ICD-10-CM | POA: Diagnosis not present

## 2020-08-27 DIAGNOSIS — L89322 Pressure ulcer of left buttock, stage 2: Secondary | ICD-10-CM | POA: Diagnosis not present

## 2020-08-27 DIAGNOSIS — R0902 Hypoxemia: Secondary | ICD-10-CM | POA: Diagnosis not present

## 2020-08-27 DIAGNOSIS — J95851 Ventilator associated pneumonia: Secondary | ICD-10-CM | POA: Diagnosis not present

## 2020-08-27 DIAGNOSIS — E440 Moderate protein-calorie malnutrition: Secondary | ICD-10-CM | POA: Diagnosis not present

## 2020-08-27 DIAGNOSIS — D72829 Elevated white blood cell count, unspecified: Secondary | ICD-10-CM | POA: Diagnosis not present

## 2020-08-27 DIAGNOSIS — I959 Hypotension, unspecified: Secondary | ICD-10-CM | POA: Diagnosis not present

## 2020-08-27 DIAGNOSIS — F1120 Opioid dependence, uncomplicated: Secondary | ICD-10-CM | POA: Diagnosis present

## 2020-08-27 DIAGNOSIS — E872 Acidosis: Secondary | ICD-10-CM | POA: Diagnosis not present

## 2020-08-27 DIAGNOSIS — L89312 Pressure ulcer of right buttock, stage 2: Secondary | ICD-10-CM | POA: Diagnosis not present

## 2020-08-27 DIAGNOSIS — G7289 Other specified myopathies: Secondary | ICD-10-CM | POA: Diagnosis not present

## 2020-08-27 DIAGNOSIS — M79673 Pain in unspecified foot: Secondary | ICD-10-CM | POA: Diagnosis not present

## 2020-08-27 DIAGNOSIS — I712 Thoracic aortic aneurysm, without rupture, unspecified: Secondary | ICD-10-CM

## 2020-08-27 DIAGNOSIS — I517 Cardiomegaly: Secondary | ICD-10-CM | POA: Diagnosis not present

## 2020-08-27 DIAGNOSIS — J9690 Respiratory failure, unspecified, unspecified whether with hypoxia or hypercapnia: Secondary | ICD-10-CM | POA: Diagnosis not present

## 2020-08-27 DIAGNOSIS — I480 Paroxysmal atrial fibrillation: Secondary | ICD-10-CM | POA: Diagnosis not present

## 2020-08-27 DIAGNOSIS — R579 Shock, unspecified: Secondary | ICD-10-CM | POA: Diagnosis not present

## 2020-08-27 DIAGNOSIS — I9589 Other hypotension: Secondary | ICD-10-CM | POA: Diagnosis not present

## 2020-08-27 DIAGNOSIS — J181 Lobar pneumonia, unspecified organism: Secondary | ICD-10-CM | POA: Diagnosis not present

## 2020-08-27 DIAGNOSIS — F1010 Alcohol abuse, uncomplicated: Secondary | ICD-10-CM | POA: Diagnosis not present

## 2020-08-27 DIAGNOSIS — J984 Other disorders of lung: Secondary | ICD-10-CM | POA: Diagnosis not present

## 2020-08-27 DIAGNOSIS — L89011 Pressure ulcer of right elbow, stage 1: Secondary | ICD-10-CM | POA: Diagnosis not present

## 2020-08-27 DIAGNOSIS — I4892 Unspecified atrial flutter: Secondary | ICD-10-CM | POA: Diagnosis present

## 2020-08-27 DIAGNOSIS — Z87891 Personal history of nicotine dependence: Secondary | ICD-10-CM

## 2020-08-27 DIAGNOSIS — Z6825 Body mass index (BMI) 25.0-25.9, adult: Secondary | ICD-10-CM

## 2020-08-27 DIAGNOSIS — G9341 Metabolic encephalopathy: Secondary | ICD-10-CM | POA: Diagnosis not present

## 2020-08-27 DIAGNOSIS — J869 Pyothorax without fistula: Secondary | ICD-10-CM | POA: Diagnosis not present

## 2020-08-27 DIAGNOSIS — R6521 Severe sepsis with septic shock: Secondary | ICD-10-CM | POA: Diagnosis not present

## 2020-08-27 DIAGNOSIS — R092 Respiratory arrest: Secondary | ICD-10-CM | POA: Diagnosis not present

## 2020-08-27 DIAGNOSIS — T4275XA Adverse effect of unspecified antiepileptic and sedative-hypnotic drugs, initial encounter: Secondary | ICD-10-CM | POA: Diagnosis not present

## 2020-08-27 DIAGNOSIS — D6489 Other specified anemias: Secondary | ICD-10-CM | POA: Diagnosis present

## 2020-08-27 DIAGNOSIS — N200 Calculus of kidney: Secondary | ICD-10-CM | POA: Diagnosis not present

## 2020-08-27 DIAGNOSIS — I110 Hypertensive heart disease with heart failure: Secondary | ICD-10-CM | POA: Diagnosis present

## 2020-08-27 DIAGNOSIS — R7989 Other specified abnormal findings of blood chemistry: Secondary | ICD-10-CM | POA: Diagnosis present

## 2020-08-27 DIAGNOSIS — R059 Cough, unspecified: Secondary | ICD-10-CM | POA: Diagnosis not present

## 2020-08-27 DIAGNOSIS — I952 Hypotension due to drugs: Secondary | ICD-10-CM | POA: Diagnosis not present

## 2020-08-27 DIAGNOSIS — I426 Alcoholic cardiomyopathy: Secondary | ICD-10-CM | POA: Diagnosis present

## 2020-08-27 DIAGNOSIS — Z4659 Encounter for fitting and adjustment of other gastrointestinal appliance and device: Secondary | ICD-10-CM

## 2020-08-27 DIAGNOSIS — I10 Essential (primary) hypertension: Secondary | ICD-10-CM | POA: Diagnosis not present

## 2020-08-27 DIAGNOSIS — Z930 Tracheostomy status: Secondary | ICD-10-CM

## 2020-08-27 DIAGNOSIS — K761 Chronic passive congestion of liver: Secondary | ICD-10-CM | POA: Diagnosis present

## 2020-08-27 DIAGNOSIS — Z20822 Contact with and (suspected) exposure to covid-19: Secondary | ICD-10-CM | POA: Diagnosis present

## 2020-08-27 DIAGNOSIS — J9811 Atelectasis: Secondary | ICD-10-CM | POA: Diagnosis not present

## 2020-08-27 DIAGNOSIS — B379 Candidiasis, unspecified: Secondary | ICD-10-CM | POA: Diagnosis not present

## 2020-08-27 DIAGNOSIS — T17500A Unspecified foreign body in bronchus causing asphyxiation, initial encounter: Secondary | ICD-10-CM | POA: Diagnosis not present

## 2020-08-27 DIAGNOSIS — R197 Diarrhea, unspecified: Secondary | ICD-10-CM | POA: Diagnosis not present

## 2020-08-27 DIAGNOSIS — M545 Low back pain, unspecified: Secondary | ICD-10-CM | POA: Diagnosis not present

## 2020-08-27 DIAGNOSIS — I5021 Acute systolic (congestive) heart failure: Secondary | ICD-10-CM | POA: Diagnosis not present

## 2020-08-27 DIAGNOSIS — L8990 Pressure ulcer of unspecified site, unspecified stage: Secondary | ICD-10-CM | POA: Diagnosis present

## 2020-08-27 DIAGNOSIS — R5381 Other malaise: Secondary | ICD-10-CM | POA: Diagnosis not present

## 2020-08-27 DIAGNOSIS — Z9889 Other specified postprocedural states: Secondary | ICD-10-CM

## 2020-08-27 DIAGNOSIS — G629 Polyneuropathy, unspecified: Secondary | ICD-10-CM | POA: Diagnosis not present

## 2020-08-27 DIAGNOSIS — R1084 Generalized abdominal pain: Secondary | ICD-10-CM | POA: Diagnosis not present

## 2020-08-27 DIAGNOSIS — G894 Chronic pain syndrome: Secondary | ICD-10-CM | POA: Diagnosis present

## 2020-08-27 DIAGNOSIS — I340 Nonrheumatic mitral (valve) insufficiency: Secondary | ICD-10-CM | POA: Diagnosis not present

## 2020-08-27 DIAGNOSIS — Q211 Atrial septal defect: Secondary | ICD-10-CM | POA: Diagnosis not present

## 2020-08-27 DIAGNOSIS — M5136 Other intervertebral disc degeneration, lumbar region: Secondary | ICD-10-CM | POA: Diagnosis present

## 2020-08-27 HISTORY — DX: Other specified myopathies: G72.89

## 2020-08-27 HISTORY — DX: Thoracic aortic aneurysm, without rupture, unspecified: I71.20

## 2020-08-27 HISTORY — DX: Opioid use, unspecified, uncomplicated: F11.90

## 2020-08-27 HISTORY — DX: Thoracic aortic aneurysm, without rupture: I71.2

## 2020-08-27 HISTORY — DX: Alcohol abuse, uncomplicated: F10.10

## 2020-08-27 LAB — CBC
HCT: 43.9 % (ref 39.0–52.0)
Hemoglobin: 15.3 g/dL (ref 13.0–17.0)
MCH: 31.8 pg (ref 26.0–34.0)
MCHC: 34.9 g/dL (ref 30.0–36.0)
MCV: 91.3 fL (ref 80.0–100.0)
Platelets: 166 10*3/uL (ref 150–400)
RBC: 4.81 MIL/uL (ref 4.22–5.81)
RDW: 13.8 % (ref 11.5–15.5)
WBC: 11.9 10*3/uL — ABNORMAL HIGH (ref 4.0–10.5)
nRBC: 0 % (ref 0.0–0.2)

## 2020-08-27 LAB — SARS CORONAVIRUS 2 BY RT PCR (HOSPITAL ORDER, PERFORMED IN ~~LOC~~ HOSPITAL LAB): SARS Coronavirus 2: NEGATIVE

## 2020-08-27 LAB — BASIC METABOLIC PANEL
Anion gap: 8 (ref 5–15)
BUN: 20 mg/dL (ref 6–20)
CO2: 29 mmol/L (ref 22–32)
Calcium: 9.2 mg/dL (ref 8.9–10.3)
Chloride: 97 mmol/L — ABNORMAL LOW (ref 98–111)
Creatinine, Ser: 0.63 mg/dL (ref 0.61–1.24)
GFR, Estimated: >60 mL/min (ref >60)
Glucose, Bld: 125 mg/dL — ABNORMAL HIGH (ref 70–99)
Potassium: 3.8 mmol/L (ref 3.5–5.1)
Sodium: 134 mmol/L — ABNORMAL LOW (ref 135–145)

## 2020-08-27 LAB — TROPONIN I (HIGH SENSITIVITY): Troponin I (High Sensitivity): 9 ng/L (ref <18)

## 2020-08-27 IMAGING — DX DG CHEST 1V PORT
1 series · 1 of 1 positions shown · non-contrast
Comparison: [DATE]

CLINICAL DATA: Cough and chest congestion.

EXAM:
PORTABLE CHEST 1 VIEW

[chest ap]
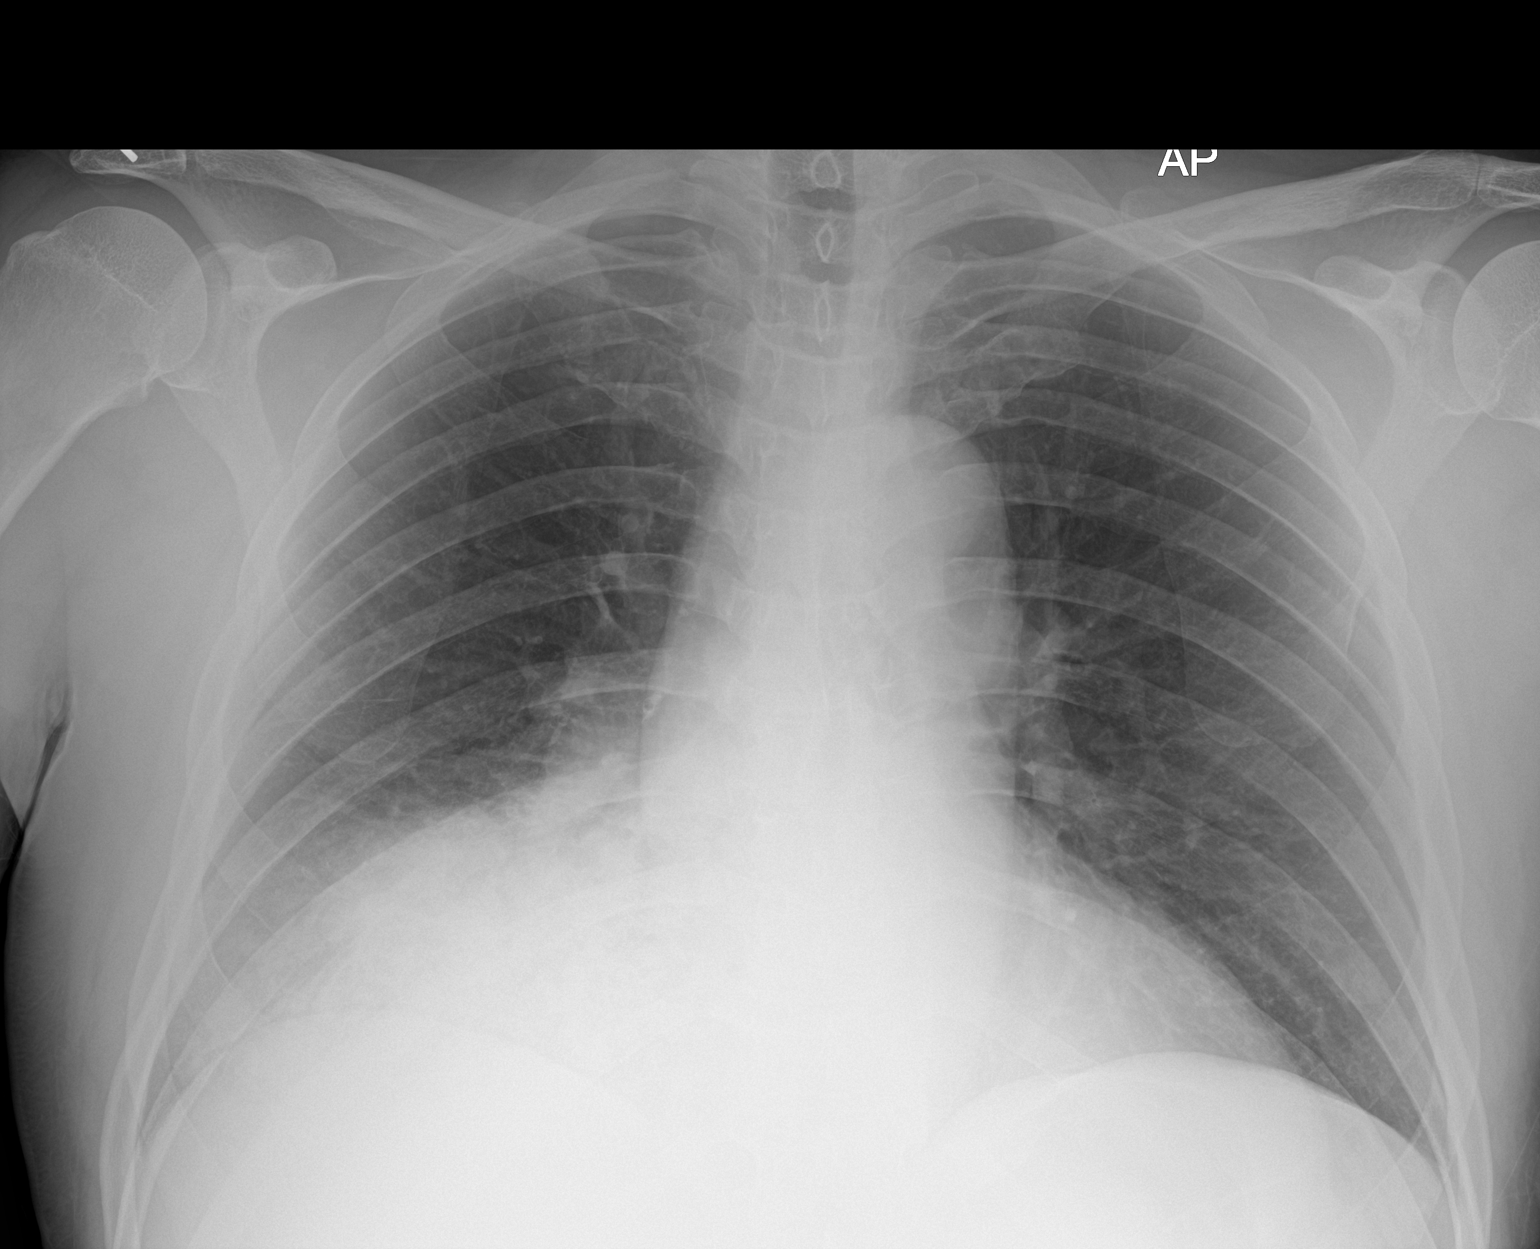

[1 of 1 positions shown; findings below may reference images not displayed]

FINDINGS: Heart size is normal. Tortuous aorta. The left lung is clear. There
is right lower lobe consolidation/bronchopneumonia. No visible
effusion.
IMPRESSION: Right lower lobe consolidation/bronchopneumonia.

## 2020-08-27 IMAGING — CT CT ANGIO CHEST
2 of 8 series · 17 of 36 positions shown · IV contrast (omnipaque)
Comparison: Radiograph [DATE].

CLINICAL DATA: Cough, chest congestion,, right-sided chest pain

EXAM:
CT ANGIOGRAPHY CHEST WITH CONTRAST
TECHNIQUE: Multidetector CT imaging of the chest was performed using the
standard protocol during bolus administration of intravenous
contrast. Multiplanar CT image reconstructions and MIPs were
obtained to evaluate the vascular anatomy.
CONTRAST:  100mL OMNIPAQUE IOHEXOL 350 MG/ML SOLN

[Series 6: pe coronal mpr · coronal · 0.70mm/px · 1 of 98 slices shown]
[im 49/98  mediastinal]
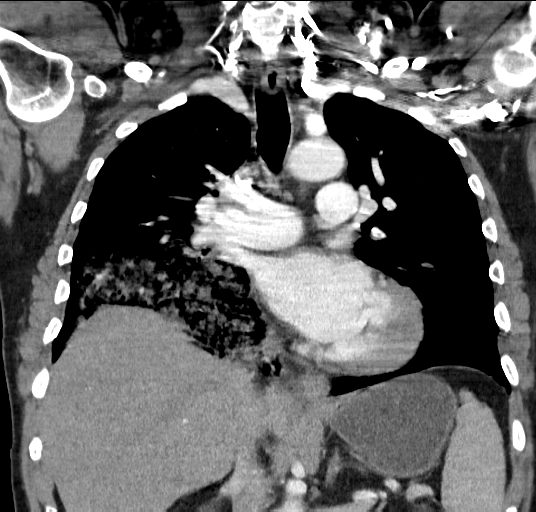

[Series 10: pe thins · axial · 0.82mm/px · z∈[+1095,+1420]mm · 16 of 479 slices shown]
[im 23/479  lung]
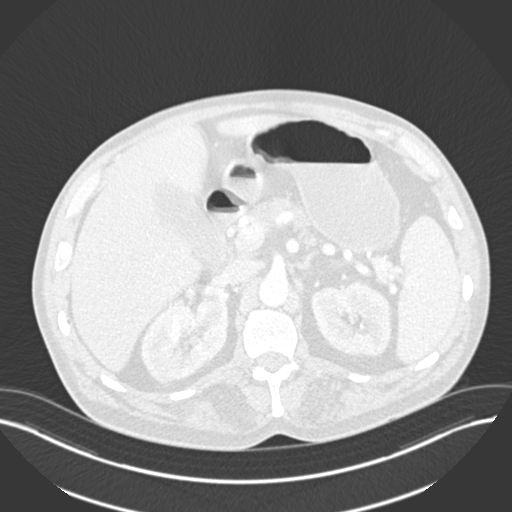
[im 46/479  mediastinal]
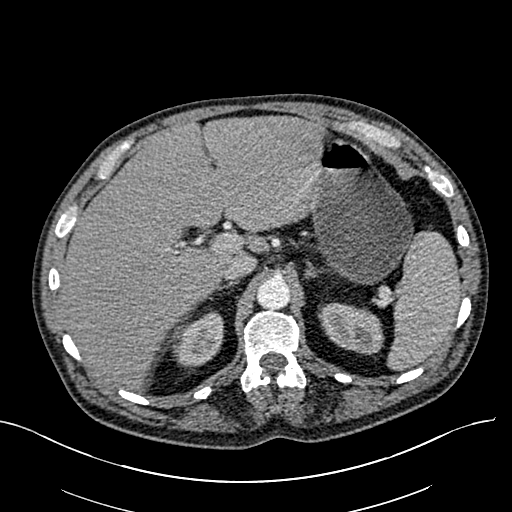
[im 92/479  lung]
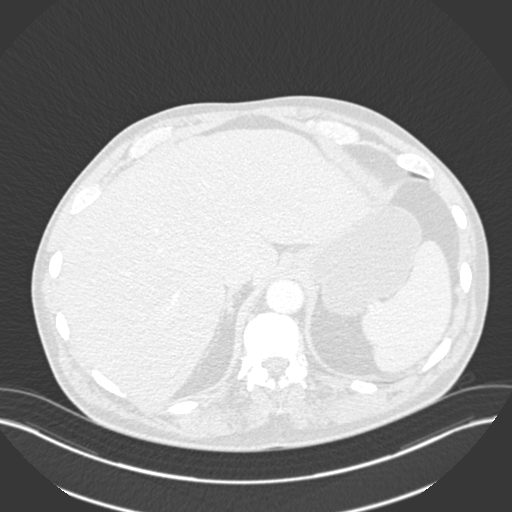
[im 114/479  mediastinal]
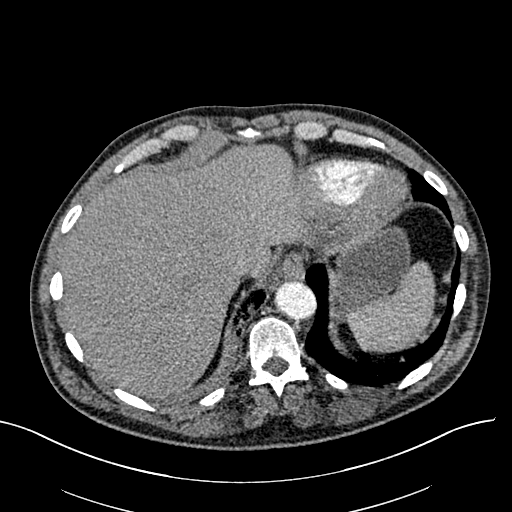
[im 137/479  lung]
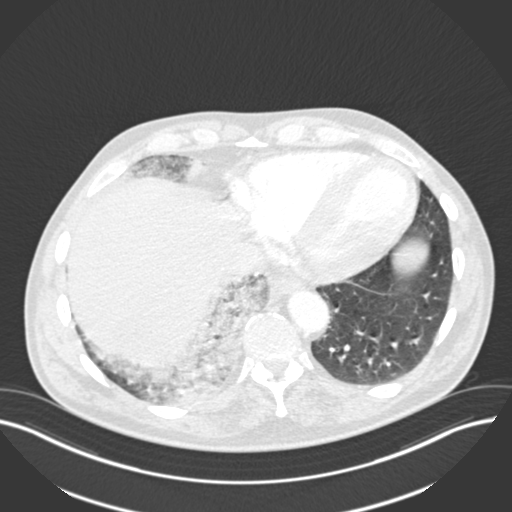
[im 160/479  mediastinal]
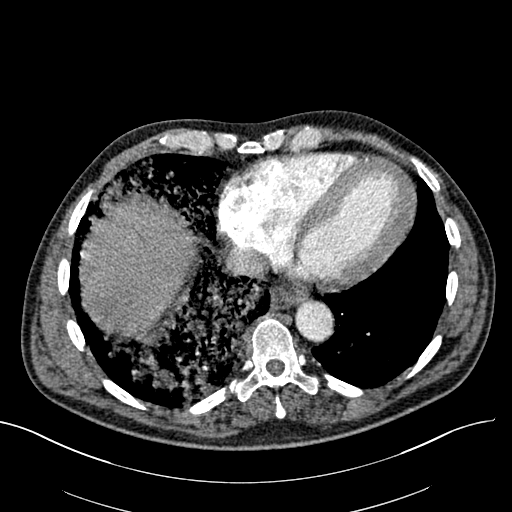
[im 205/479  lung]
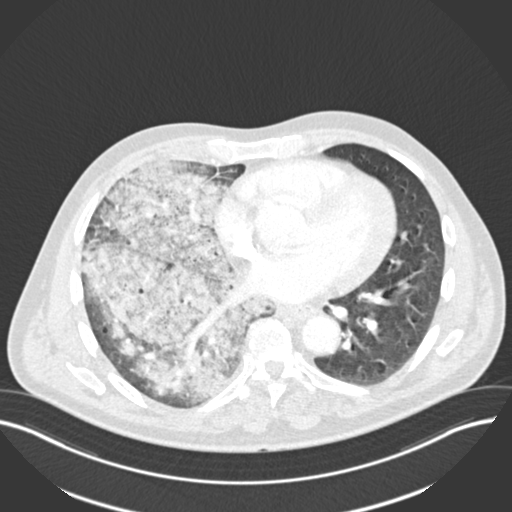
[im 228/479  mediastinal]
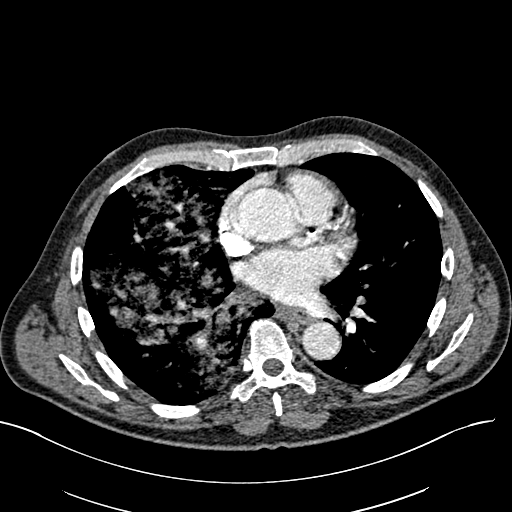
[im 251/479  lung]
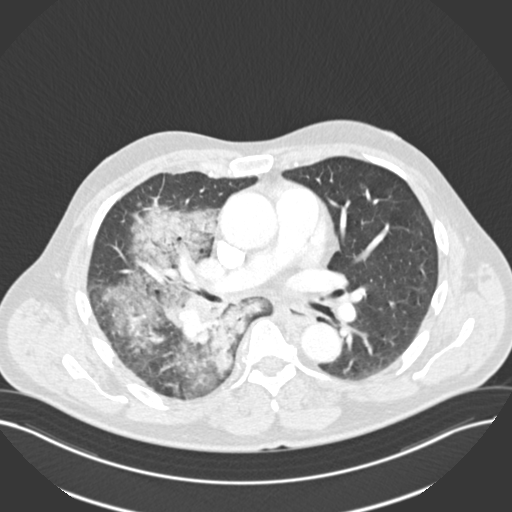
[im 274/479  mediastinal]
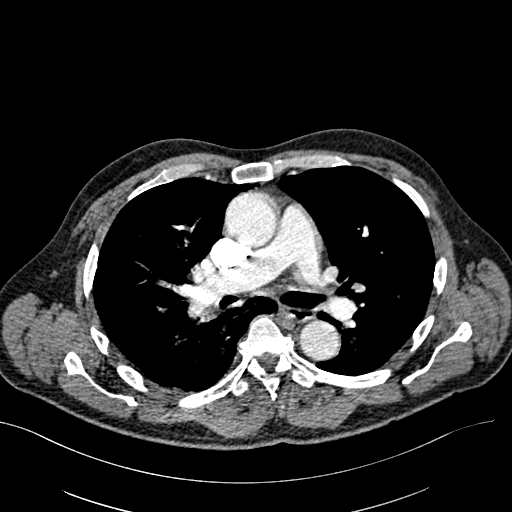
[im 319/479  lung]
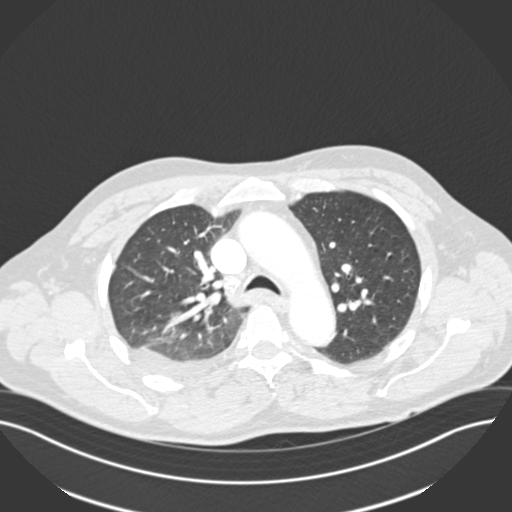
[im 342/479  mediastinal]
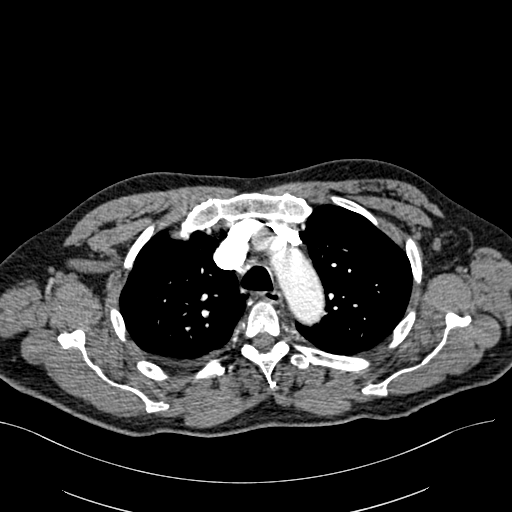
[im 365/479  lung]
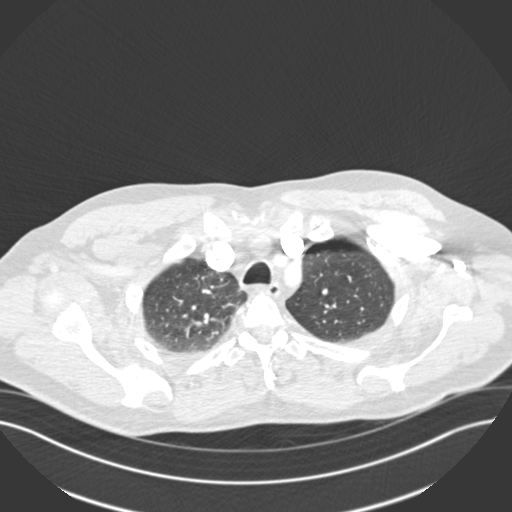
[im 387/479  mediastinal]
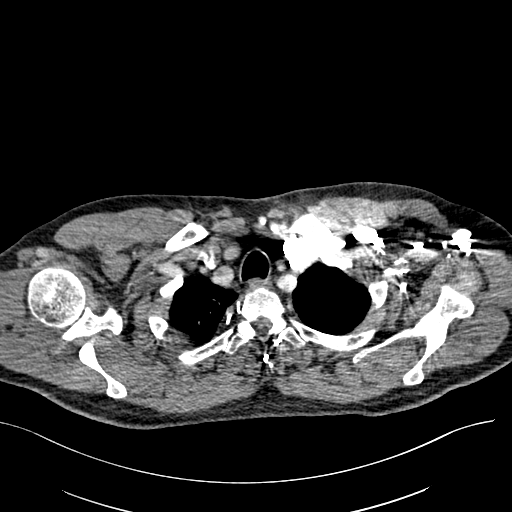
[im 433/479  lung]
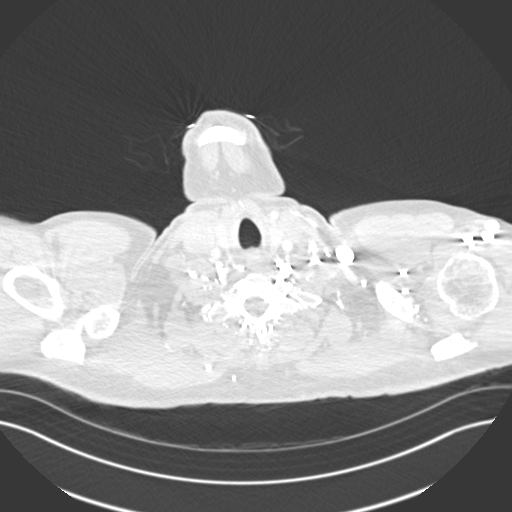
[im 456/479  mediastinal]
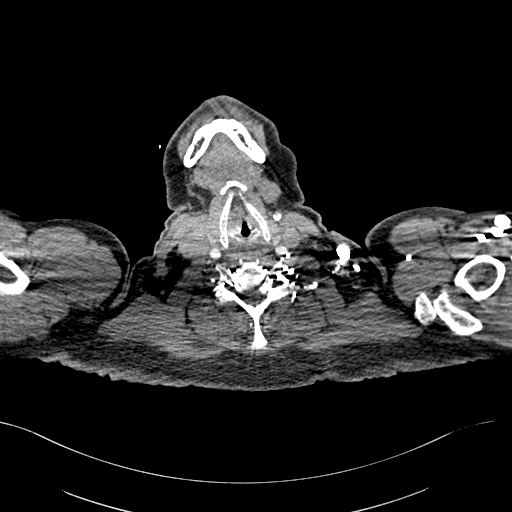

[17 of 36 positions shown; findings below may reference images not displayed]

FINDINGS: Cardiovascular: Satisfactory opacification the pulmonary arteries.
No pulmonary artery filling defects are identified to the lobar
level with more distal evaluation limited by respiratory motion
artifact. Central pulmonary arteries are normal caliber. Normal
heart size. No pericardial effusion. Coronary artery calcifications
are present. The aortic root is suboptimally assessed given cardiac
pulsation artifact. Ascending thoracic aorta dilated to
approximately 4.6 cm. Returning to a more normal caliber 2.9 cm by
the level of the distal aortic arch. No acute luminal abnormality is
seen. No periaortic stranding or hemorrhage. Normal 3 vessel
branching of the aortic arch. Proximal great vessels are
unremarkable.

Mediastinum/Nodes: No mediastinal fluid or gas. Normal thyroid gland
and thoracic inlet. No acute abnormality of the trachea or
esophagus. Shotty subcentimeter mediastinal and hilar lymph nodes
are present compatible with reactive adenopathy. No worrisome
pathologically enlarged mediastinal, hilar or axillary adenopathy.

Lungs/Pleura: Dense regions of mixed patchy consolidative and
ground-glass opacity are present throughout the right middle and
lower lobes with some associated interlobular septal thickening.
Bubbly lucencies within the regions of more consolidative opacity
are likely related to more diffuse centrilobular emphysematous
changes seen elsewhere throughout the lungs. Left lung remains
relatively clear aside from some mild atelectasis. No pneumothorax.
No pleural fluid.

Upper Abdomen: No acute abnormalities present in the visualized
portions of the upper abdomen.

Musculoskeletal: No acute osseous abnormality or suspicious osseous
lesion. Multilevel degenerative changes are present in the imaged
portions of the spine. No worrisome chest wall masses or lesions.

Review of the MIP images confirms the above findings.
IMPRESSION: 1. No pulmonary embolism to the lobar level with more distal
assessment limited by respiratory motion artifact.
2. Dense regions of mixed consolidative and ground-glass opacity
with septal thickening in the right middle and lower lobe
compatible, can be seen in the setting atypical infection including
influenza or other viral pneumonia, less likely asymmetric edema.
3. Ascending thoracic aortic aneurysm measuring up to 4.6 cm. No
acute aortic abnormality is seen. Ascending thoracic aortic
aneurysm. Recommend semi-annual imaging followup by CTA or MRA and
referral to cardiothoracic surgery if not already obtained. This
recommendation follows [7S]
ACCF/AHA/AATS/ACR/ASA/SCA/OURARI/OURARI/OURARI/OURARI Guidelines for the
Diagnosis and Management of Patients With Thoracic Aortic Disease.
Circulation. [7S]; 121: [id number]. Aortic aneurysm NOS ([7S]-[7S])
4. Coronary artery calcifications are present. Please note that the
presence of coronary artery calcium documents the presence of
coronary artery disease, the severity of this disease and any
potential stenosis cannot be assessed on this non-gated CT
examination.
5. Aortic Atherosclerosis ([7S]-[7S])
6. Emphysema ([7S]-[7S]).

## 2020-08-27 MED ORDER — PREDNISONE 50 MG PO TABS
60.0000 mg | ORAL_TABLET | Freq: Once | ORAL | Status: AC
  Administered 2020-08-27: 60 mg via ORAL
  Filled 2020-08-27: qty 1

## 2020-08-27 MED ORDER — SODIUM CHLORIDE 0.9 % IV SOLN
500.0000 mg | Freq: Once | INTRAVENOUS | Status: AC
  Administered 2020-08-28: 500 mg via INTRAVENOUS
  Filled 2020-08-27: qty 500

## 2020-08-27 MED ORDER — ALBUTEROL SULFATE HFA 108 (90 BASE) MCG/ACT IN AERS
INHALATION_SPRAY | RESPIRATORY_TRACT | Status: AC
  Filled 2020-08-27: qty 6.7

## 2020-08-27 MED ORDER — ALBUTEROL SULFATE HFA 108 (90 BASE) MCG/ACT IN AERS
6.0000 | INHALATION_SPRAY | Freq: Once | RESPIRATORY_TRACT | Status: AC
  Administered 2020-08-27: 6 via RESPIRATORY_TRACT

## 2020-08-27 MED ORDER — SODIUM CHLORIDE 0.9 % IV SOLN
2.0000 g | Freq: Once | INTRAVENOUS | Status: AC
  Administered 2020-08-28: 2 g via INTRAVENOUS
  Filled 2020-08-27: qty 20

## 2020-08-27 MED ORDER — IOHEXOL 350 MG/ML SOLN
100.0000 mL | Freq: Once | INTRAVENOUS | Status: AC | PRN
  Administered 2020-08-27: 100 mL via INTRAVENOUS

## 2020-08-27 MED ORDER — FENTANYL CITRATE (PF) 100 MCG/2ML IJ SOLN
50.0000 ug | Freq: Once | INTRAMUSCULAR | Status: AC
Start: 2020-08-27 — End: 2020-08-27
  Administered 2020-08-27: 50 ug via INTRAVENOUS
  Filled 2020-08-27: qty 2

## 2020-08-27 MED ORDER — MORPHINE SULFATE (PF) 4 MG/ML IV SOLN
4.0000 mg | Freq: Once | INTRAVENOUS | Status: AC
Start: 2020-08-27 — End: 2020-08-27
  Administered 2020-08-27: 4 mg via INTRAVENOUS
  Filled 2020-08-27: qty 1

## 2020-08-27 NOTE — ED Notes (Signed)
Presents with rt upper ant chest pain that radiates to mid sternal area, states he was lying down when pain came on suddenly, all at approx 1100hrs. Pt is noted to moaning a lot, having facial grimacing, restless on stretcher.

## 2020-08-27 NOTE — ED Notes (Signed)
Called pt from lobby to triage for portable chest xray. Pt was lying floor-reports it was because he was in a lot of pain. Able to stand and walk to triage on his own without difficulty. After xray completed pt noted to be on floor in triage room. Once again stating it was due to pain. Advised pt he would be going to room shortly

## 2020-08-27 NOTE — ED Notes (Signed)
Assumed care of patient. Placed back on monitor - pt had removed [place] O2 and all monitoring. Instructed patient that monitoring is necessary while he is in hospital. Awaiting COVID results for next steps. Pt made aware.

## 2020-08-27 NOTE — ED Provider Notes (Signed)
[facility] EMERGENCY DEPARTMENT Provider Note   CSN: [id number] Arrival date & time: 08/27/20  1759     History Chief Complaint  Patient presents with  . Chest Pain    Joseph Ayala is a 55 y.o. male.  Patient is a 55 year old male with a history of hypertension, necrotizing myopathy, opioid use disorder on Suboxone who presents with chest pain.  He said he has had some cough and cold symptoms for about a week.  He says his cough is productive of sputum.  He denies any fevers he has been having some shortness of breath.  No leg swelling.  He said earlier today he started having sudden onset of pain in his right chest that radiates to his right back.  It is worse with movement and worse with breathing.  He feels more short of breath due to this.  No vomiting or diarrhea.  He does not have a history of asthma or underlying lung issues that he knows of.        Past Medical History:  Diagnosis Date  . Degenerative disc disease, lumbar   . Dental crown present   . History of kidney stones   . Hypertension    states under control with meds., has been on med. x 1 yr. - is currently out of med., to see PCP 02/06/2018  . Muscle atrophy 01/2018  . Muscle weakness 01/2018    Patient Active Problem List   Diagnosis Date Noted  . Healthcare maintenance 06/20/2020  . At Risk Alcohol Use 04/11/2020  . Hypertension 07/13/2019  . ADHD 06/29/2019  . Low testosterone 06/01/2019  . Moderate opioid use disorder (HCC) 09/11/2018  . Necrotizing myopathy 12/27/2017  . Chronic pain syndrome 10/22/2017  . Degeneration of lumbar intervertebral disc 09/25/2017  . Lumbar spondylosis 09/25/2017    Past Surgical History:  Procedure Laterality Date  . ENDOVENOUS ABLATION SAPHENOUS VEIN W/ LASER Right 12-29-2015   endovenous laser ablation right greater saphenous vein, stab phlebectomy > 20 incisions right leg, sclerotherapy right leg by Gretta Began MD    . MINOR MUSCLE BIOPSY Right  02/11/2018   Procedure: RECTUS FEMORIS MUSCLE BIOPSY;  Surgeon: Darnell Level, MD;  Location: [facility];  Service: General;  Laterality: Right;  . MUSCLE BIOPSY Right 02/11/2018   Procedure: DELTOID MUSCLE BIOPSY;  Surgeon: Darnell Level, MD;  Location: [facility];  Service: General;  Laterality: Right;  . NASAL FRACTURE SURGERY         Family History  Problem Relation Age of Onset  . Varicose Veins Brother     Social History   Tobacco Use  . Smoking status: Never Smoker  . Smokeless tobacco: Former Clinical biochemist  . Vaping Use: Never used  Substance Use Topics  . Alcohol use: Yes    Comment: occasionally  . Drug use: No    Home Medications Prior to Admission medications   Medication Sig Start Date End Date Taking? Authorizing Provider  Buprenorphine HCl-Naloxone HCl 8-2 MG FILM Take 2 and 1/2 films sublingual daily 06/20/20  Yes [name], MD  testosterone cypionate (DEPOTESTOSTERONE CYPIONATE) 200 MG/ML injection Inject 2 mLs (400 mg total) into the muscle every 14 (fourteen) days. 05/05/20   [name], MD    Allergies    Vicodin [hydrocodone-acetaminophen]  Review of Systems   Review of Systems  Constitutional: Positive for fatigue. Negative for chills, diaphoresis and fever.  HENT: Positive for congestion and rhinorrhea. Negative  for sneezing.   Eyes: Negative.   Respiratory: Positive for cough and shortness of breath. Negative for chest tightness.   Cardiovascular: Positive for chest pain. Negative for leg swelling.  Gastrointestinal: Negative for abdominal pain, blood in stool, diarrhea, nausea and vomiting.  Genitourinary: Negative for difficulty urinating, flank pain, frequency and hematuria.  Musculoskeletal: Negative for arthralgias and back pain.  Skin: Negative for rash.  Neurological: Negative for dizziness, speech difficulty, weakness, numbness and headaches.    Physical Exam Updated Vital Signs BP  108/76 (BP Location: Right Arm)   Pulse (!) 104   Temp 98.1 F (36.7 C) (Oral)   Resp (!) 28   Ht 5\' 11"  (1.803 m)   Wt 95.3 kg   SpO2 94%   BMI 29.29 kg/m   Physical Exam Constitutional:      Appearance: He is well-developed and well-nourished. He is ill-appearing.     Comments: Appears uncomfortable  HENT:     Head: Normocephalic and atraumatic.  Eyes:     Pupils: Pupils are equal, round, and reactive to light.  Cardiovascular:     Rate and Rhythm: Regular rhythm. Tachycardia present.     Heart sounds: Normal heart sounds.  Pulmonary:     Effort: Pulmonary effort is normal. Tachypnea present. No respiratory distress.     Breath sounds: Wheezing present. No rales.  Chest:     Chest wall: No tenderness.  Abdominal:     General: Bowel sounds are normal.     Palpations: Abdomen is soft.     Tenderness: There is no abdominal tenderness. There is no guarding or rebound.  Musculoskeletal:        General: No edema. Normal range of motion.     Cervical back: Normal range of motion and neck supple.     Comments: No edema or calf tenderness  Lymphadenopathy:     Cervical: No cervical adenopathy.  Skin:    General: Skin is warm and dry.     Findings: No rash.  Neurological:     Mental Status: He is alert and oriented to person, place, and time.  Psychiatric:        Mood and Affect: Mood and affect normal.     ED Results / Procedures / Treatments   Labs (all labs ordered are listed, but only abnormal results are displayed) Labs Reviewed  BASIC METABOLIC PANEL - Abnormal; Notable for the following components:      Result Value   Sodium 134 (*)    Chloride 97 (*)    Glucose, Bld 125 (*)    All other components within normal limits  CBC - Abnormal; Notable for the following components:   WBC 11.9 (*)    All other components within normal limits  SARS CORONAVIRUS 2 (HOSPITAL ORDER, [facility]FOR[facility])  TROPONIN I (HIGH SENSITIVITY)  TROPONIN I (HIGH  SENSITIVITY)    EKG EKG Interpretation  Date/Time:  Saturday August 27 2020 18:03:07 EST Ventricular Rate:  108 PR Interval:  152 QRS Duration: 82 QT Interval:  322 QTC Calculation: 431 R Axis:   17 Text Interpretation: Sinus tachycardia Possible Left atrial enlargement Left ventricular hypertrophy with repolarization abnormality ( R in aVL , Sokolow-Lyon , Romhilt-Estes ) Abnormal ECG SINCE LAST TRACING HEART RATE HAS INCREASED Confirmed by Rolan BuccoBelfi, Diogo Anne [id number]) on 08/27/2020 8:22:33 PM   Radiology CT Angio Chest PE W/Cm &/Or Wo Cm  Result Date: 08/27/2020 CLINICAL DATA:  Cough, chest congestion,, right-sided chest pain EXAM: CT  ANGIOGRAPHY CHEST WITH CONTRAST TECHNIQUE: Multidetector CT imaging of the chest was performed using the standard protocol during bolus administration of intravenous contrast. Multiplanar CT image reconstructions and MIPs were obtained to evaluate the vascular anatomy. CONTRAST:  OMNIPAQUE IOHEXOL 350 MG/ML SOLN COMPARISON:  Radiograph 08/27/2020. FINDINGS: Cardiovascular: Satisfactory opacification the pulmonary arteries. No pulmonary artery filling defects are identified to the lobar level with more distal evaluation limited by respiratory motion artifact. Central pulmonary arteries are normal caliber. Normal heart size. No pericardial effusion. Coronary artery calcifications are present. The aortic root is suboptimally assessed given cardiac pulsation artifact. Ascending thoracic aorta dilated to approximately 4.6 cm. Returning to a more normal caliber 2.9 cm by the level of the distal aortic arch. No acute luminal abnormality is seen. No periaortic stranding or hemorrhage. Normal 3 vessel branching of the aortic arch. Proximal great vessels are unremarkable. Mediastinum/Nodes: No mediastinal fluid or gas. Normal thyroid gland and thoracic inlet. No acute abnormality of the trachea or esophagus. Shotty subcentimeter mediastinal and hilar lymph nodes are present  compatible with reactive adenopathy. No worrisome pathologically enlarged mediastinal, hilar or axillary adenopathy. Lungs/Pleura: Dense regions of mixed patchy consolidative and ground-glass opacity are present throughout the right middle and lower lobes with some associated interlobular septal thickening. Bubbly lucencies within the regions of more consolidative opacity are likely related to more diffuse centrilobular emphysematous changes seen elsewhere throughout the lungs. Left lung remains relatively clear aside from some mild atelectasis. No pneumothorax. No pleural fluid. Upper Abdomen: No acute abnormalities present in the visualized portions of the upper abdomen. Musculoskeletal: No acute osseous abnormality or suspicious osseous lesion. Multilevel degenerative changes are present in the imaged portions of the spine. No worrisome chest wall masses or lesions. Review of the MIP images confirms the above findings. IMPRESSION: 1. No pulmonary embolism to the lobar level with more distal assessment limited by respiratory motion artifact. 2. Dense regions of mixed consolidative and ground-glass opacity with septal thickening in the right middle and lower lobe compatible, can be seen in the setting atypical infection including influenza or other viral pneumonia, less likely asymmetric edema. 3. Ascending thoracic aortic aneurysm measuring up to 4.6 cm. No acute aortic abnormality is seen. Ascending thoracic aortic aneurysm. Recommend semi-annual imaging followup by CTA or MRA and referral to cardiothoracic surgery if not already obtained. This recommendation follows 2010 ACCF/AHA/AATS/ACR/ASA/SCA/SCAI/SIR/STS/SVM Guidelines for the Diagnosis and Management of Patients With Thoracic Aortic Disease. Circulation. 2010; 121: E952-W413. Aortic aneurysm NOS (ICD10-I71.9) 4. Coronary artery calcifications are present. Please note that the presence of coronary artery calcium documents the presence of coronary artery  disease, the severity of this disease and any potential stenosis cannot be assessed on this non-gated CT examination. 5. Aortic Atherosclerosis (ICD10-I70.0) 6. Emphysema (ICD10-J43.9). Electronically Signed   By: Kreg Shropshire M.D.   On: 08/27/2020 21:07   DG Chest Port 1 View  Result Date: 08/27/2020 CLINICAL DATA:  Cough and chest congestion. EXAM: PORTABLE CHEST 1 VIEW COMPARISON:  08/01/2009 FINDINGS: Heart size is normal. Tortuous aorta. The left lung is clear. There is right lower lobe consolidation/bronchopneumonia. No visible effusion. IMPRESSION: Right lower lobe consolidation/bronchopneumonia. Electronically Signed   By: Paulina Fusi M.D.   On: 08/27/2020 19:33    Procedures Procedures   Medications Ordered in ED Medications  fentaNYL (SUBLIMAZE) injection 50 mcg (50 mcg Intravenous Given 08/27/20 2005)  albuterol (VENTOLIN HFA) 108 (90 Base) MCG/ACT inhaler 6 puff (6 puffs Inhalation Given 08/27/20 2033)  predniSONE (DELTASONE) tablet 60 mg (60  mg Oral Given 08/27/20 2044)  iohexol (OMNIPAQUE) 350 MG/ML injection 100 mL (100 mLs Intravenous Contrast Given 08/27/20 2047)  morphine 4 MG/ML injection 4 mg (4 mg Intravenous Given 08/27/20 2113)    ED Course  I have reviewed the triage vital signs and the nursing notes.  Pertinent labs & imaging results that were available during my care of the patient were reviewed by me and considered in my medical decision making (see chart for details).    MDM Rules/Calculators/A&P                          Patient is a 55 year old male who presents with cough and chest pain.  He has been having cough for about a week but now has some right-sided chest pain.  It seems to be pleuritic in nature.  He also has some mild hypoxia and is requiring nasal cannula oxygen.  His oxygen saturations dropped down to about 88 or 89% on room air.  He is not febrile.  He has some mild tachycardia and mild tachypnea.  His chest x-ray showed looks like right-sided  pneumonia.  Given his symptoms, CT scan was performed which shows evidence of consolidation in the right chest which could represent pneumonia versus atypical infection.  His Covid test is pending.  His WBC count is mildly elevated.  He also has evidence of a thoracic aneurysm on CT scan.  This will need outpatient monitoring.  He was notified of this.  Patient turned over to Dr. Pilar Plate pending Covid test.  He will need admission for further treatment. Final Clinical Impression(s) / ED Diagnoses Final diagnoses:  Thoracic aortic aneurysm without rupture [facility]    Rx / DC Orders ED Discharge Orders    None       Rolan Bucco, MD 08/27/20 2307

## 2020-08-27 NOTE — ED Notes (Signed)
Pt in triage room receiving xray

## 2020-08-27 NOTE — ED Notes (Signed)
Continues to remove cardiac monitoring equipment, states his chest hurts, explained to client rationale for the cardiac and POX monitoring. EDP is aware of pts cont c/o ant rt sided chest pain

## 2020-08-27 NOTE — ED Notes (Signed)
Pt to CT

## 2020-08-27 NOTE — ED Triage Notes (Addendum)
Reports right sided chest pain.  Reports he's been sick for about a week and has been coughing a lot.  Taking tylenol for pain with no relief.  Last dose this morning. Reports nausea but no vomiting.  As triage progressed reports wife works at [employer].  He has been having body aches, fatigue, cough, chills, loss of smell and taste.  Took rapid test on Tuesday and was negative at home.

## 2020-08-27 NOTE — ED Provider Notes (Signed)
  Provider Note MRN:  [id number]  Arrival date & time: 08/27/20    ED Course and Medical Decision Making  Assumed care from Dr. Fredderick Phenix at shift change.  Pneumonia, question bacterial versus COVID-19.  New oxygen requirement, will need admission.  Covid test is negative, will admit for community-acquired pneumonia with new oxygen requirement.  Providing ceftriaxone, azithromycin.  Procedures  Final Clinical Impressions(s) / ED Diagnoses     ICD-10-CM   1. Thoracic aortic aneurysm without rupture (HCC)  I71.2   2. Chest pain  R07.9 DG Chest [facility] 1 View    DG Chest Port 1 View  3. Community acquired pneumonia, unspecified laterality  J18.9   4. Hypoxia  R09.02     ED Discharge Orders    None      Discharge Instructions   None     Elmer Sow. Pilar Plate, MD [facility] Emergency [facility] [email or web address]    Sabas Sous, MD 08/27/20 [phone]

## 2020-08-27 NOTE — ED Notes (Signed)
Pt is very anxious, is more tachypneic than at presentation to ED, POX noted to be at 88-91% on RA, pt placed on [place] at 2lpm, EDP informed

## 2020-08-28 ENCOUNTER — Inpatient Hospital Stay (HOSPITAL_COMMUNITY): Payer: Medicare HMO

## 2020-08-28 ENCOUNTER — Other Ambulatory Visit: Payer: Self-pay

## 2020-08-28 ENCOUNTER — Encounter (HOSPITAL_COMMUNITY): Payer: Self-pay | Admitting: Family Medicine

## 2020-08-28 DIAGNOSIS — G7281 Critical illness myopathy: Secondary | ICD-10-CM | POA: Diagnosis present

## 2020-08-28 DIAGNOSIS — J189 Pneumonia, unspecified organism: Secondary | ICD-10-CM | POA: Diagnosis present

## 2020-08-28 DIAGNOSIS — Z4682 Encounter for fitting and adjustment of non-vascular catheter: Secondary | ICD-10-CM | POA: Diagnosis not present

## 2020-08-28 DIAGNOSIS — E440 Moderate protein-calorie malnutrition: Secondary | ICD-10-CM | POA: Diagnosis not present

## 2020-08-28 DIAGNOSIS — J9690 Respiratory failure, unspecified, unspecified whether with hypoxia or hypercapnia: Secondary | ICD-10-CM | POA: Diagnosis not present

## 2020-08-28 DIAGNOSIS — R1084 Generalized abdominal pain: Secondary | ICD-10-CM | POA: Diagnosis not present

## 2020-08-28 DIAGNOSIS — I959 Hypotension, unspecified: Secondary | ICD-10-CM | POA: Diagnosis not present

## 2020-08-28 DIAGNOSIS — Q211 Atrial septal defect: Secondary | ICD-10-CM | POA: Diagnosis not present

## 2020-08-28 DIAGNOSIS — I712 Thoracic aortic aneurysm, without rupture: Secondary | ICD-10-CM | POA: Diagnosis not present

## 2020-08-28 DIAGNOSIS — G9341 Metabolic encephalopathy: Secondary | ICD-10-CM | POA: Diagnosis not present

## 2020-08-28 DIAGNOSIS — G629 Polyneuropathy, unspecified: Secondary | ICD-10-CM | POA: Diagnosis not present

## 2020-08-28 DIAGNOSIS — I248 Other forms of acute ischemic heart disease: Secondary | ICD-10-CM | POA: Diagnosis present

## 2020-08-28 DIAGNOSIS — I38 Endocarditis, valve unspecified: Secondary | ICD-10-CM | POA: Diagnosis not present

## 2020-08-28 DIAGNOSIS — E871 Hypo-osmolality and hyponatremia: Secondary | ICD-10-CM | POA: Diagnosis not present

## 2020-08-28 DIAGNOSIS — J15212 Pneumonia due to Methicillin resistant Staphylococcus aureus: Secondary | ICD-10-CM | POA: Diagnosis present

## 2020-08-28 DIAGNOSIS — M792 Neuralgia and neuritis, unspecified: Secondary | ICD-10-CM | POA: Diagnosis not present

## 2020-08-28 DIAGNOSIS — I361 Nonrheumatic tricuspid (valve) insufficiency: Secondary | ICD-10-CM | POA: Diagnosis not present

## 2020-08-28 DIAGNOSIS — B9562 Methicillin resistant Staphylococcus aureus infection as the cause of diseases classified elsewhere: Secondary | ICD-10-CM | POA: Diagnosis not present

## 2020-08-28 DIAGNOSIS — J850 Gangrene and necrosis of lung: Secondary | ICD-10-CM | POA: Diagnosis not present

## 2020-08-28 DIAGNOSIS — K4090 Unilateral inguinal hernia, without obstruction or gangrene, not specified as recurrent: Secondary | ICD-10-CM | POA: Diagnosis not present

## 2020-08-28 DIAGNOSIS — I4891 Unspecified atrial fibrillation: Secondary | ICD-10-CM | POA: Diagnosis not present

## 2020-08-28 DIAGNOSIS — R6521 Severe sepsis with septic shock: Secondary | ICD-10-CM | POA: Diagnosis not present

## 2020-08-28 DIAGNOSIS — A419 Sepsis, unspecified organism: Secondary | ICD-10-CM | POA: Diagnosis present

## 2020-08-28 DIAGNOSIS — Z9911 Dependence on respirator [ventilator] status: Secondary | ICD-10-CM | POA: Diagnosis not present

## 2020-08-28 DIAGNOSIS — J9601 Acute respiratory failure with hypoxia: Secondary | ICD-10-CM

## 2020-08-28 DIAGNOSIS — J9811 Atelectasis: Secondary | ICD-10-CM | POA: Diagnosis not present

## 2020-08-28 DIAGNOSIS — R0602 Shortness of breath: Secondary | ICD-10-CM | POA: Diagnosis not present

## 2020-08-28 DIAGNOSIS — I340 Nonrheumatic mitral (valve) insufficiency: Secondary | ICD-10-CM | POA: Diagnosis not present

## 2020-08-28 DIAGNOSIS — R579 Shock, unspecified: Secondary | ICD-10-CM | POA: Diagnosis not present

## 2020-08-28 DIAGNOSIS — J690 Pneumonitis due to inhalation of food and vomit: Secondary | ICD-10-CM | POA: Diagnosis not present

## 2020-08-28 DIAGNOSIS — R5381 Other malaise: Secondary | ICD-10-CM | POA: Diagnosis not present

## 2020-08-28 DIAGNOSIS — R410 Disorientation, unspecified: Secondary | ICD-10-CM | POA: Diagnosis not present

## 2020-08-28 DIAGNOSIS — Z20822 Contact with and (suspected) exposure to covid-19: Secondary | ICD-10-CM | POA: Diagnosis not present

## 2020-08-28 DIAGNOSIS — Z452 Encounter for adjustment and management of vascular access device: Secondary | ICD-10-CM | POA: Diagnosis not present

## 2020-08-28 DIAGNOSIS — I10 Essential (primary) hypertension: Secondary | ICD-10-CM | POA: Diagnosis not present

## 2020-08-28 DIAGNOSIS — D72829 Elevated white blood cell count, unspecified: Secondary | ICD-10-CM | POA: Diagnosis not present

## 2020-08-28 DIAGNOSIS — N179 Acute kidney failure, unspecified: Secondary | ICD-10-CM | POA: Diagnosis not present

## 2020-08-28 DIAGNOSIS — I5021 Acute systolic (congestive) heart failure: Secondary | ICD-10-CM | POA: Diagnosis not present

## 2020-08-28 DIAGNOSIS — J9600 Acute respiratory failure, unspecified whether with hypoxia or hypercapnia: Secondary | ICD-10-CM | POA: Diagnosis not present

## 2020-08-28 DIAGNOSIS — R1310 Dysphagia, unspecified: Secondary | ICD-10-CM | POA: Diagnosis not present

## 2020-08-28 DIAGNOSIS — G928 Other toxic encephalopathy: Secondary | ICD-10-CM | POA: Diagnosis not present

## 2020-08-28 DIAGNOSIS — M7989 Other specified soft tissue disorders: Secondary | ICD-10-CM | POA: Diagnosis not present

## 2020-08-28 DIAGNOSIS — I517 Cardiomegaly: Secondary | ICD-10-CM | POA: Diagnosis not present

## 2020-08-28 DIAGNOSIS — R079 Chest pain, unspecified: Secondary | ICD-10-CM | POA: Diagnosis present

## 2020-08-28 DIAGNOSIS — I480 Paroxysmal atrial fibrillation: Secondary | ICD-10-CM | POA: Diagnosis not present

## 2020-08-28 DIAGNOSIS — I2510 Atherosclerotic heart disease of native coronary artery without angina pectoris: Secondary | ICD-10-CM | POA: Diagnosis not present

## 2020-08-28 DIAGNOSIS — E872 Acidosis: Secondary | ICD-10-CM | POA: Diagnosis not present

## 2020-08-28 DIAGNOSIS — N200 Calculus of kidney: Secondary | ICD-10-CM | POA: Diagnosis not present

## 2020-08-28 DIAGNOSIS — J181 Lobar pneumonia, unspecified organism: Secondary | ICD-10-CM | POA: Diagnosis not present

## 2020-08-28 DIAGNOSIS — J984 Other disorders of lung: Secondary | ICD-10-CM | POA: Diagnosis not present

## 2020-08-28 DIAGNOSIS — F05 Delirium due to known physiological condition: Secondary | ICD-10-CM | POA: Diagnosis not present

## 2020-08-28 DIAGNOSIS — I4892 Unspecified atrial flutter: Secondary | ICD-10-CM | POA: Diagnosis present

## 2020-08-28 DIAGNOSIS — N2889 Other specified disorders of kidney and ureter: Secondary | ICD-10-CM | POA: Diagnosis not present

## 2020-08-28 DIAGNOSIS — I5041 Acute combined systolic (congestive) and diastolic (congestive) heart failure: Secondary | ICD-10-CM | POA: Diagnosis not present

## 2020-08-28 DIAGNOSIS — R509 Fever, unspecified: Secondary | ICD-10-CM | POA: Diagnosis not present

## 2020-08-28 DIAGNOSIS — G9340 Encephalopathy, unspecified: Secondary | ICD-10-CM | POA: Diagnosis not present

## 2020-08-28 DIAGNOSIS — T17500A Unspecified foreign body in bronchus causing asphyxiation, initial encounter: Secondary | ICD-10-CM | POA: Diagnosis not present

## 2020-08-28 DIAGNOSIS — D638 Anemia in other chronic diseases classified elsewhere: Secondary | ICD-10-CM | POA: Diagnosis not present

## 2020-08-28 DIAGNOSIS — R570 Cardiogenic shock: Secondary | ICD-10-CM | POA: Diagnosis not present

## 2020-08-28 DIAGNOSIS — J869 Pyothorax without fistula: Secondary | ICD-10-CM | POA: Diagnosis not present

## 2020-08-28 DIAGNOSIS — I471 Supraventricular tachycardia: Secondary | ICD-10-CM | POA: Diagnosis present

## 2020-08-28 DIAGNOSIS — R0902 Hypoxemia: Secondary | ICD-10-CM | POA: Diagnosis not present

## 2020-08-28 DIAGNOSIS — R109 Unspecified abdominal pain: Secondary | ICD-10-CM | POA: Diagnosis not present

## 2020-08-28 DIAGNOSIS — G894 Chronic pain syndrome: Secondary | ICD-10-CM | POA: Diagnosis not present

## 2020-08-28 DIAGNOSIS — J90 Pleural effusion, not elsewhere classified: Secondary | ICD-10-CM | POA: Diagnosis not present

## 2020-08-28 DIAGNOSIS — A4102 Sepsis due to Methicillin resistant Staphylococcus aureus: Secondary | ICD-10-CM | POA: Diagnosis not present

## 2020-08-28 DIAGNOSIS — I77810 Thoracic aortic ectasia: Secondary | ICD-10-CM | POA: Diagnosis not present

## 2020-08-28 DIAGNOSIS — I9589 Other hypotension: Secondary | ICD-10-CM | POA: Diagnosis not present

## 2020-08-28 DIAGNOSIS — R7401 Elevation of levels of liver transaminase levels: Secondary | ICD-10-CM | POA: Diagnosis not present

## 2020-08-28 DIAGNOSIS — J9621 Acute and chronic respiratory failure with hypoxia: Secondary | ICD-10-CM | POA: Diagnosis not present

## 2020-08-28 DIAGNOSIS — G7289 Other specified myopathies: Secondary | ICD-10-CM | POA: Diagnosis not present

## 2020-08-28 DIAGNOSIS — R918 Other nonspecific abnormal finding of lung field: Secondary | ICD-10-CM | POA: Diagnosis not present

## 2020-08-28 DIAGNOSIS — F1190 Opioid use, unspecified, uncomplicated: Secondary | ICD-10-CM | POA: Diagnosis not present

## 2020-08-28 DIAGNOSIS — Z515 Encounter for palliative care: Secondary | ICD-10-CM | POA: Diagnosis not present

## 2020-08-28 DIAGNOSIS — Y848 Other medical procedures as the cause of abnormal reaction of the patient, or of later complication, without mention of misadventure at the time of the procedure: Secondary | ICD-10-CM | POA: Diagnosis not present

## 2020-08-28 DIAGNOSIS — F1120 Opioid dependence, uncomplicated: Secondary | ICD-10-CM | POA: Diagnosis present

## 2020-08-28 DIAGNOSIS — J1520 Pneumonia due to staphylococcus, unspecified: Secondary | ICD-10-CM | POA: Diagnosis not present

## 2020-08-28 HISTORY — DX: Pneumonia, unspecified organism: J18.9

## 2020-08-28 HISTORY — DX: Acute respiratory failure with hypoxia: J96.01

## 2020-08-28 HISTORY — DX: Sepsis, unspecified organism: A41.9

## 2020-08-28 LAB — GLUCOSE, CAPILLARY
Glucose-Capillary: 123 mg/dL — ABNORMAL HIGH (ref 70–99)
Glucose-Capillary: 67 mg/dL — ABNORMAL LOW (ref 70–99)
Glucose-Capillary: 84 mg/dL (ref 70–99)

## 2020-08-28 LAB — BLOOD GAS, ARTERIAL
Acid-Base Excess: 1.8 mmol/L (ref 0.0–2.0)
Acid-base deficit: 5.8 mmol/L — ABNORMAL HIGH (ref 0.0–2.0)
Bicarbonate: 24.8 mmol/L (ref 20.0–28.0)
Bicarbonate: 22.1 mmol/L (ref 20.0–28.0)
Drawn by: 11249
FIO2: 100
FIO2: 100
MECHVT: 600 mL
O2 Saturation: 97.7 %
O2 Saturation: 90.7 %
PEEP: 5 cmH2O
Patient temperature: 98.6
Patient temperature: 98.9
RATE: 18 resp/min
pCO2 arterial: 35.2 mmHg (ref 32.0–48.0)
pCO2 arterial: 53.8 mmHg — ABNORMAL HIGH (ref 32.0–48.0)
pH, Arterial: 7.462 — ABNORMAL HIGH (ref 7.350–7.450)
pH, Arterial: 7.238 — ABNORMAL LOW (ref 7.350–7.450)
pO2, Arterial: 103 mmHg (ref 83.0–108.0)
pO2, Arterial: 72.4 mmHg — ABNORMAL LOW (ref 83.0–108.0)

## 2020-08-28 LAB — CBC
HCT: 43.5 % (ref 39.0–52.0)
HCT: 42.5 % (ref 39.0–52.0)
Hemoglobin: 14.7 g/dL (ref 13.0–17.0)
Hemoglobin: 14.6 g/dL (ref 13.0–17.0)
MCH: 31.6 pg (ref 26.0–34.0)
MCH: 31.5 pg (ref 26.0–34.0)
MCHC: 33.8 g/dL (ref 30.0–36.0)
MCHC: 34.4 g/dL (ref 30.0–36.0)
MCV: 93.5 fL (ref 80.0–100.0)
MCV: 91.8 fL (ref 80.0–100.0)
Platelets: 152 10*3/uL (ref 150–400)
Platelets: 188 10*3/uL (ref 150–400)
RBC: 4.65 MIL/uL (ref 4.22–5.81)
RBC: 4.63 MIL/uL (ref 4.22–5.81)
RDW: 14.1 % (ref 11.5–15.5)
RDW: 14.2 % (ref 11.5–15.5)
WBC: 7.4 10*3/uL (ref 4.0–10.5)
WBC: 8 10*3/uL (ref 4.0–10.5)
nRBC: 0 % (ref 0.0–0.2)
nRBC: 0 % (ref 0.0–0.2)

## 2020-08-28 LAB — HEPATIC FUNCTION PANEL
ALT: 54 U/L — ABNORMAL HIGH (ref 0–44)
AST: 46 U/L — ABNORMAL HIGH (ref 15–41)
Albumin: 3 g/dL — ABNORMAL LOW (ref 3.5–5.0)
Alkaline Phosphatase: 47 U/L (ref 38–126)
Bilirubin, Direct: 0.3 mg/dL — ABNORMAL HIGH (ref 0.0–0.2)
Indirect Bilirubin: 0.9 mg/dL (ref 0.3–0.9)
Total Bilirubin: 1.2 mg/dL (ref 0.3–1.2)
Total Protein: 5.8 g/dL — ABNORMAL LOW (ref 6.5–8.1)

## 2020-08-28 LAB — HIV ANTIBODY (ROUTINE TESTING W REFLEX): HIV Screen 4th Generation wRfx: NONREACTIVE

## 2020-08-28 LAB — AMYLASE: Amylase: 18 U/L — ABNORMAL LOW (ref 28–100)

## 2020-08-28 LAB — LACTIC ACID, PLASMA
Lactic Acid, Venous: 2.3 mmol/L (ref 0.5–1.9)
Lactic Acid, Venous: 2.6 mmol/L (ref 0.5–1.9)
Lactic Acid, Venous: 4.3 mmol/L (ref 0.5–1.9)
Lactic Acid, Venous: 4.7 mmol/L (ref 0.5–1.9)

## 2020-08-28 LAB — PROTIME-INR
INR: 1.2 (ref 0.8–1.2)
Prothrombin Time: 15.1 seconds (ref 11.4–15.2)

## 2020-08-28 LAB — BRAIN NATRIURETIC PEPTIDE: B Natriuretic Peptide: 114.2 pg/mL — ABNORMAL HIGH (ref 0.0–100.0)

## 2020-08-28 LAB — APTT: aPTT: 29 seconds (ref 24–36)

## 2020-08-28 LAB — TROPONIN I (HIGH SENSITIVITY): Troponin I (High Sensitivity): 17 ng/L (ref <18)

## 2020-08-28 LAB — LIPASE, BLOOD: Lipase: 20 U/L (ref 11–51)

## 2020-08-28 IMAGING — CT CT ABD-PELV W/ CM
2 of 6 series · 15 of 46 positions shown, 17 images · IV contrast (OMNIPAQUE)
Comparison: CT abdomen pelvis dated [DATE].

CLINICAL DATA: 55-year-old male with abdominal pain.

EXAM:
CT ABDOMEN AND PELVIS WITH CONTRAST
TECHNIQUE: Multidetector CT imaging of the abdomen and pelvis was performed
using the standard protocol following bolus administration of
intravenous contrast.
CONTRAST:  100mL OMNIPAQUE IOHEXOL 300 MG/ML  SOLN

[Series 2: axial st · axial · 0.86mm/px · z∈[+1055,+1530]mm · 12 of 113 slices shown, 14 images]
[im 9/113  soft-tissue]
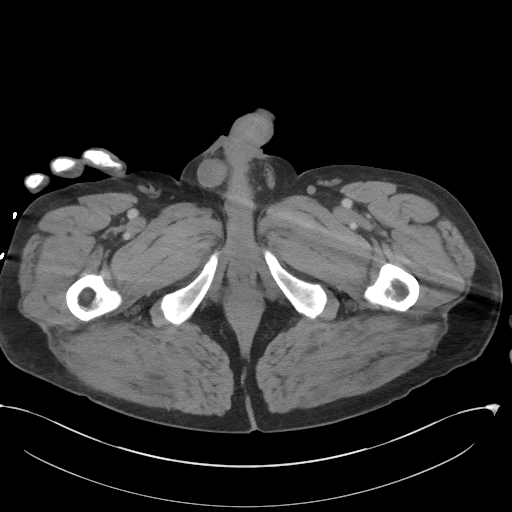
[im 9/113  bone]
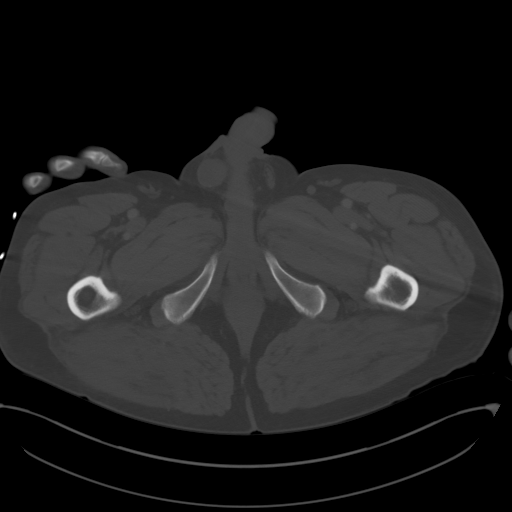
[im 18/113  soft-tissue]
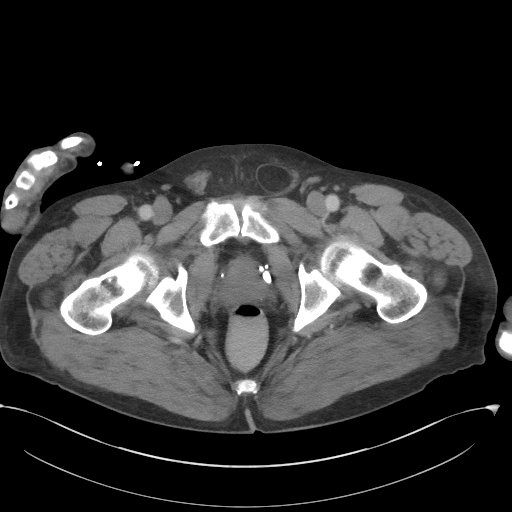
[im 26/113  soft-tissue]
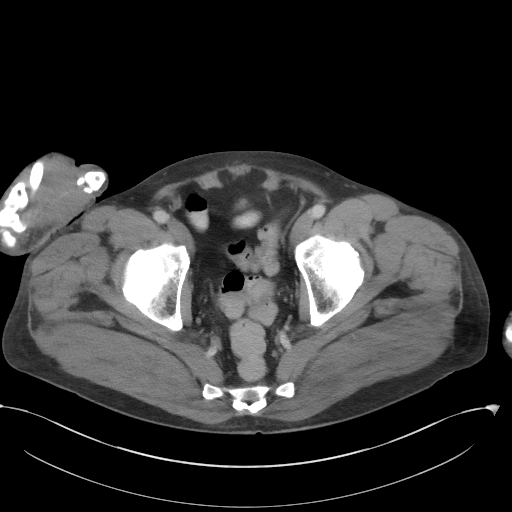
[im 35/113  soft-tissue]
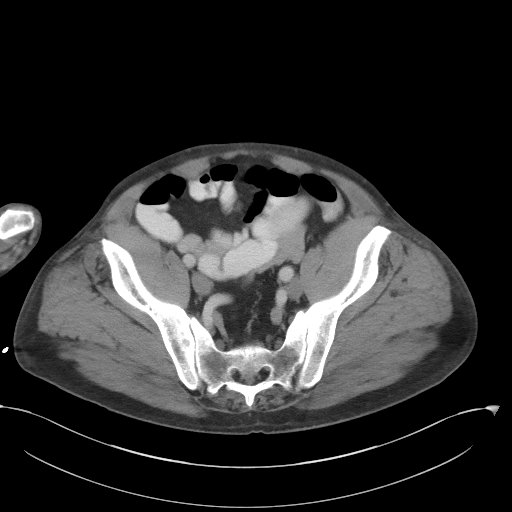
[im 44/113  soft-tissue]
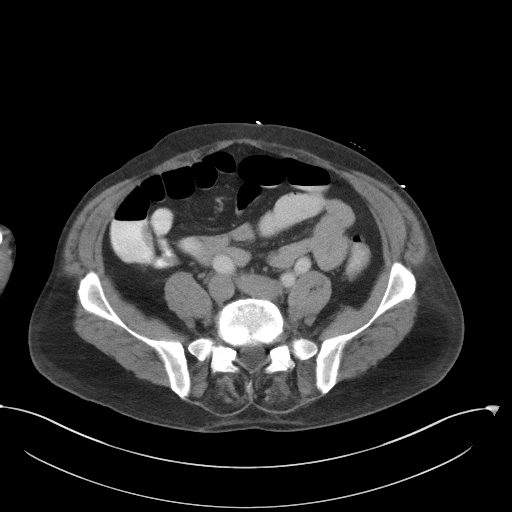
[im 52/113  soft-tissue]
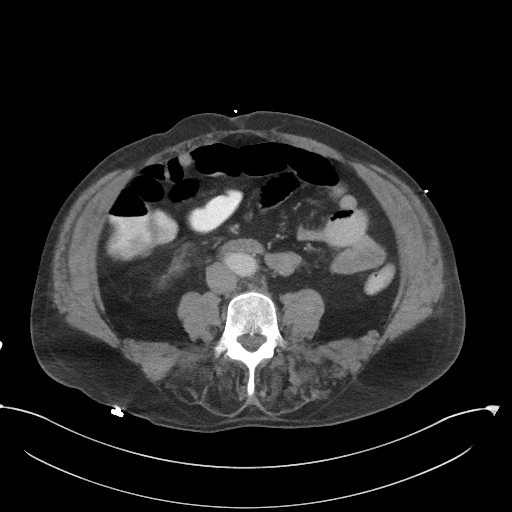
[im 61/113  soft-tissue]
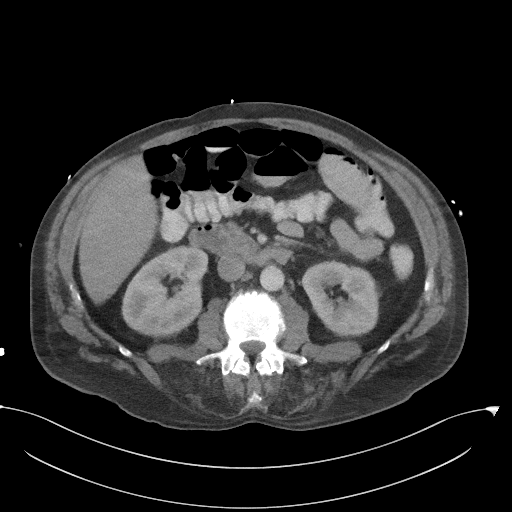
[im 69/113  soft-tissue]
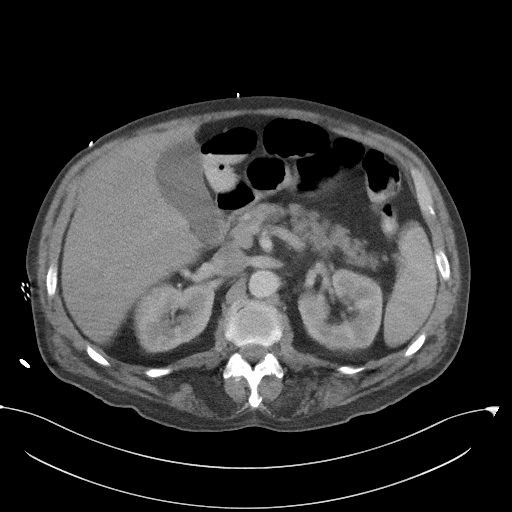
[im 78/113  soft-tissue]
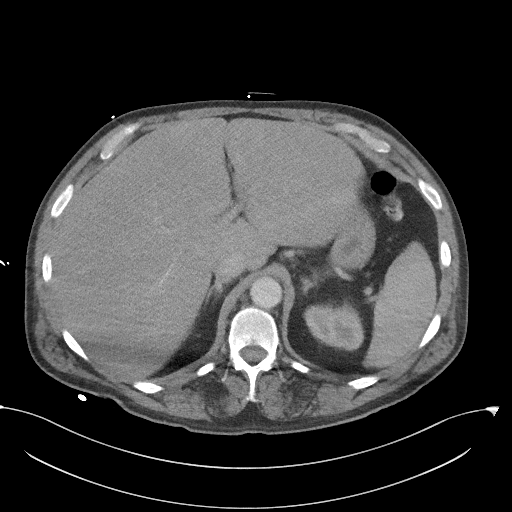
[im 78/113  bone]
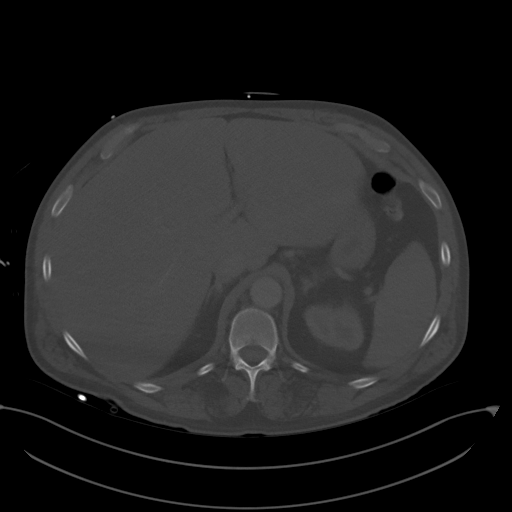
[im 87/113  soft-tissue]
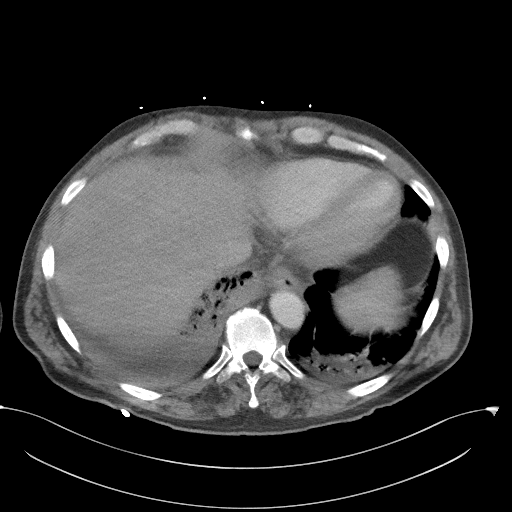
[im 95/113  soft-tissue]
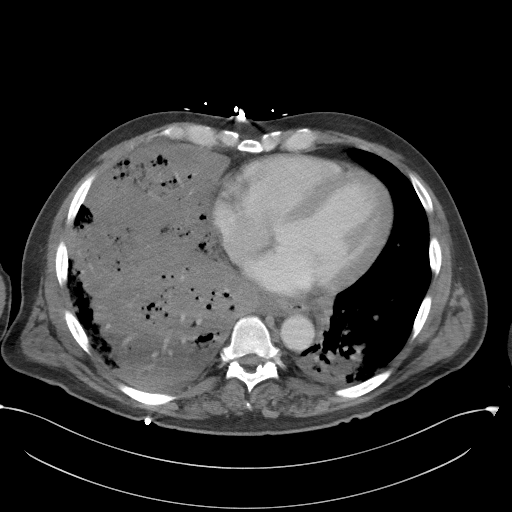
[im 104/113  soft-tissue]
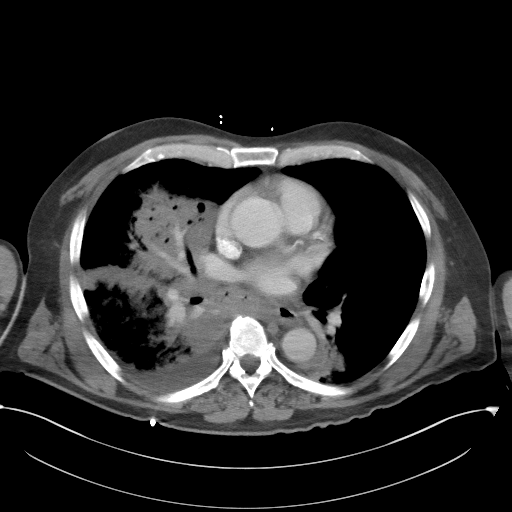

[Series 7: coronal st · coronal · 0.78mm/px · 3 of 101 slices shown]
[im 34/101  soft-tissue]
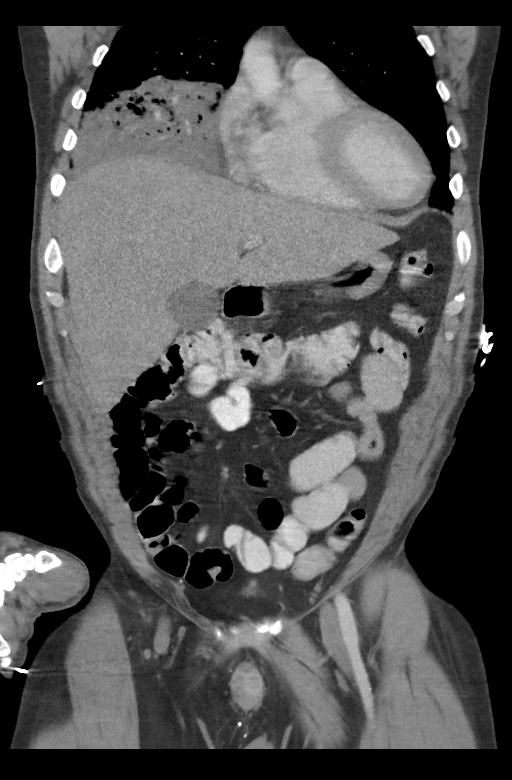
[im 45/101  soft-tissue]
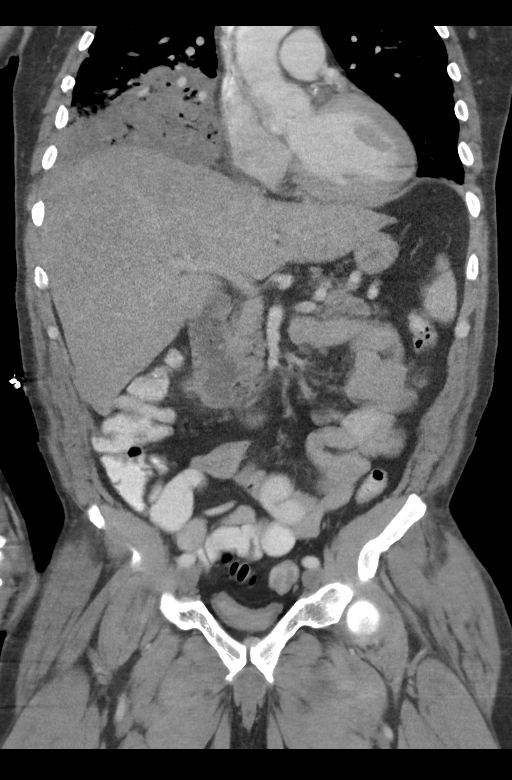
[im 56/101  soft-tissue]
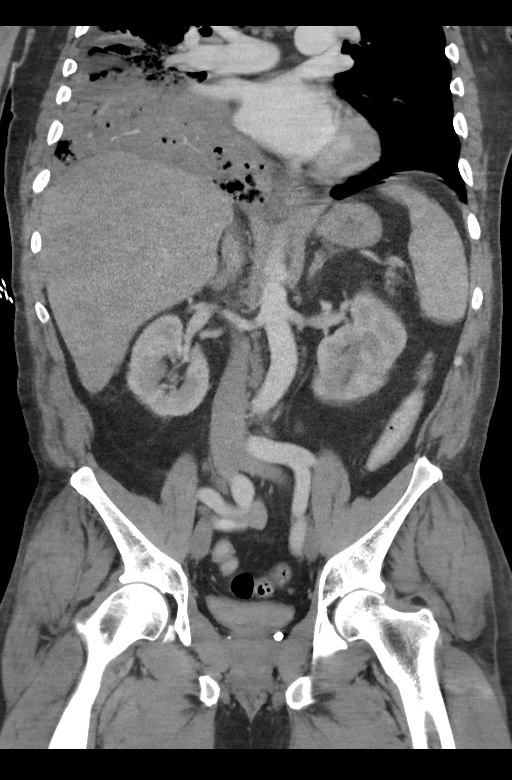

[15 of 46 positions shown; findings below may reference images not displayed]

FINDINGS: Lower chest: There is a partially visualized small to moderate right
pleural effusion. There is a large area of consolidation involving
the majority of the right lower and right middle lobes most
consistent with pneumonia. Scattered small pockets of air within the
consolidative area may represent bronchogram although developing
cavitation is not excluded. There is subsegmental consolidation of
the left lower lobe with air bronchogram.

No intra-abdominal free air or free fluid.

Hepatobiliary: Diffuse fatty liver. No intrahepatic biliary ductal
dilatation. The gallbladder is unremarkable.

Pancreas: Unremarkable. No pancreatic ductal dilatation or
surrounding inflammatory changes.

Spleen: Normal in size without focal abnormality.

Adrenals/Urinary Tract: The adrenal glands unremarkable. There is a
5 mm stone in the lower pole of the left kidney. A punctate
nonobstructing left renal upper pole calculus. No hydronephrosis.
The right kidney is unremarkable. There is symmetric enhancement and
excretion of contrast by both kidneys. The visualized ureters and
urinary bladder appear unremarkable.

Stomach/Bowel: There is no bowel obstruction or active inflammation.
The appendix is not visualized with certainty. No inflammatory
changes identified in the right lower quadrant.

Vascular/Lymphatic: The abdominal aorta and IVC are unremarkable. No
portal venous gas. There is no adenopathy.

Reproductive: The prostate and seminal vesicles are grossly
unremarkable. No pelvic mass.

Other: Small fat containing left inguinal hernia.

Musculoskeletal: Degenerative changes of the spine. No acute osseous
pathology.
IMPRESSION: 1. Large area of consolidation involving the majority of the right
lower and right middle lobes most consistent with pneumonia.
Underlying mass is not excluded. Clinical correlation and follow-up
to resolution recommended.
2. Partially visualized small to moderate right pleural effusion.
3. Fatty liver.
4. Nonobstructing left renal calculi. No hydronephrosis.
5. No bowel obstruction.

## 2020-08-28 IMAGING — DX DG CHEST 1V PORT
1 series · 1 of 1 positions shown · non-contrast
Comparison: Yesterday

CLINICAL DATA: Acute hypoxic respiratory failure.

EXAM:
PORTABLE CHEST 1 VIEW

[chest ap]
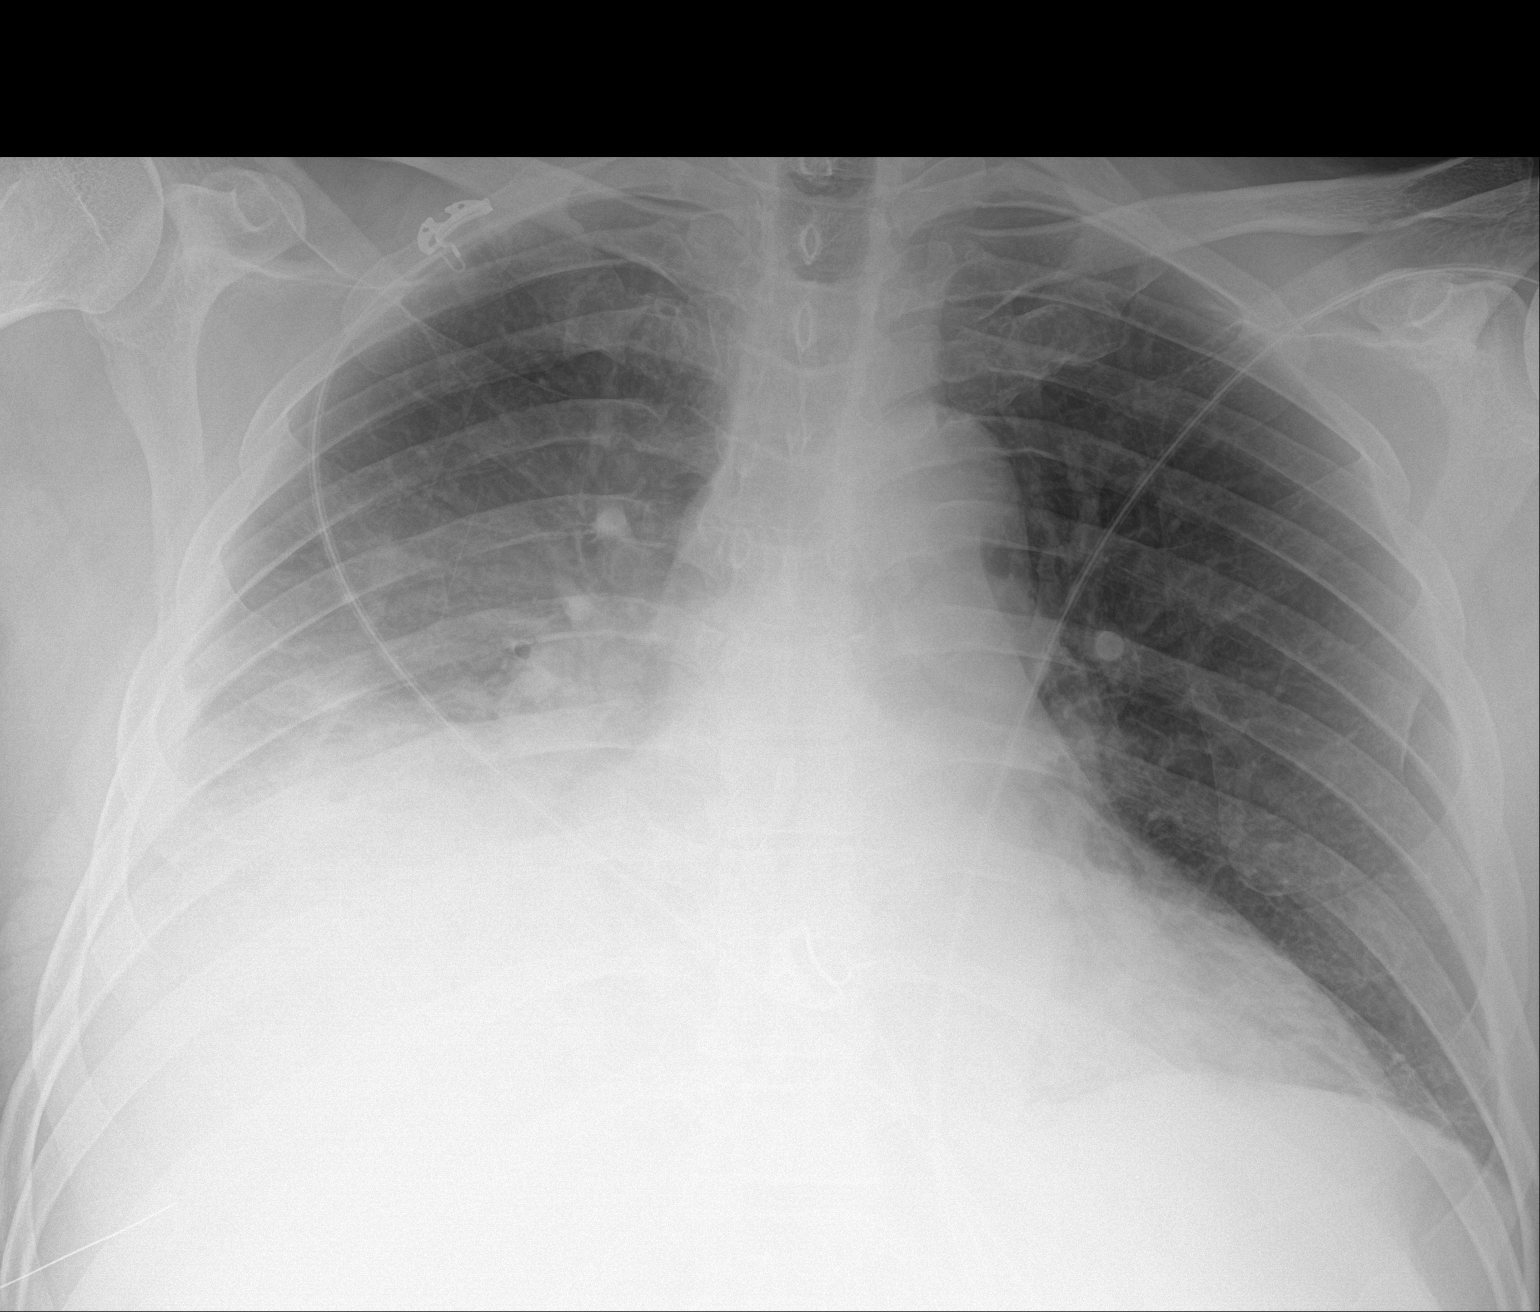

[1 of 1 positions shown; findings below may reference images not displayed]

FINDINGS: Progressive dense opacity in the right lower lung. Clear left lung.
Stable heart size and mediastinal contours.
IMPRESSION: Progressive right-sided airspace disease.

## 2020-08-28 IMAGING — DX DG ABDOMEN 1V
1 series · 1 of 1 positions shown · non-contrast
Comparison: Radiograph [DATE]

CLINICAL DATA: OG tube placement, intubation

EXAM:
PORTABLE CHEST - 1 VIEW
ABDOMEN - 1 VIEW

[abdomen kub]
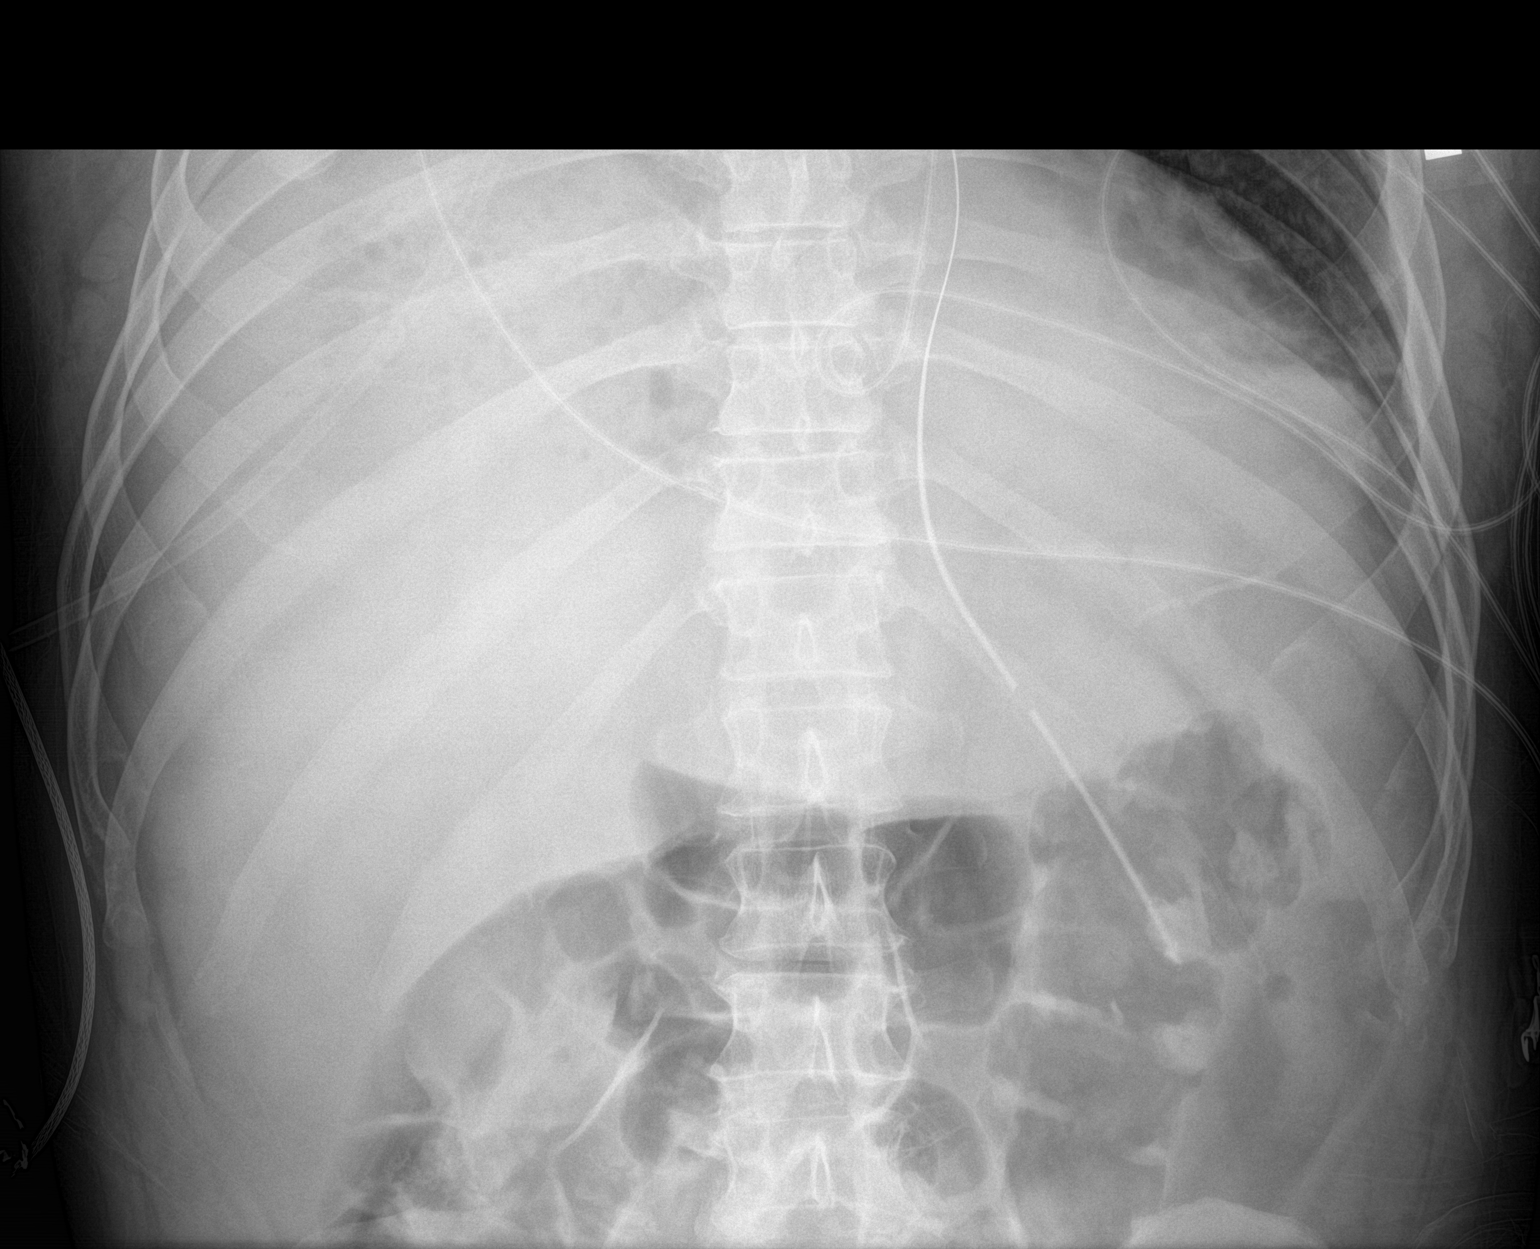

[1 of 1 positions shown; findings below may reference images not displayed]

FINDINGS: Endotracheal tube in the mid trachea 3.3 cm from the carina.

Transesophageal tube tip and side port terminate below the margins
of chest imaging, with the terminus on the abdominal radiograph in
the region of the gastric body.

Telemetry leads and external support devices overlie the chest and
abdomen

Some increasing gradient opacity is noted throughout the right
hemithorax with some lateral pleural thickening suggestive of a
developing pleural effusion. Persistent basilar regions of
consolidation compatible with the right middle and lower lobe
airspace disease with bubbly lucencies related to underlying
emphysematous change and associated air bronchograms, as seen on
comparison CT imaging. Left lung remains largely clear aside from
some atelectatic change and vascular crowding. No left effusion. No
visible pneumothorax.

Abdominal radiograph encompasses only portion of the upper abdomen
with several air-filled loops albeit without high-grade obstructive
pattern. No suspicious calcifications.

No acute osseous abnormality or suspicious osseous lesion.
IMPRESSION: 1. Endotracheal tube in the mid trachea 3.3 cm from the carina.
2. Transesophageal tube appropriately terminates in the region of
the gastric body with side port distal to the GE junction.
3. Persistent right middle and lower lobe airspace disease.
4. Suspect developing right pleural effusion.

## 2020-08-28 IMAGING — DX DG CHEST 1V PORT
1 series · 1 of 1 positions shown · non-contrast
Comparison: Radiograph [DATE]

CLINICAL DATA: OG tube placement, intubation

EXAM:
PORTABLE CHEST - 1 VIEW
ABDOMEN - 1 VIEW

[chest ap]
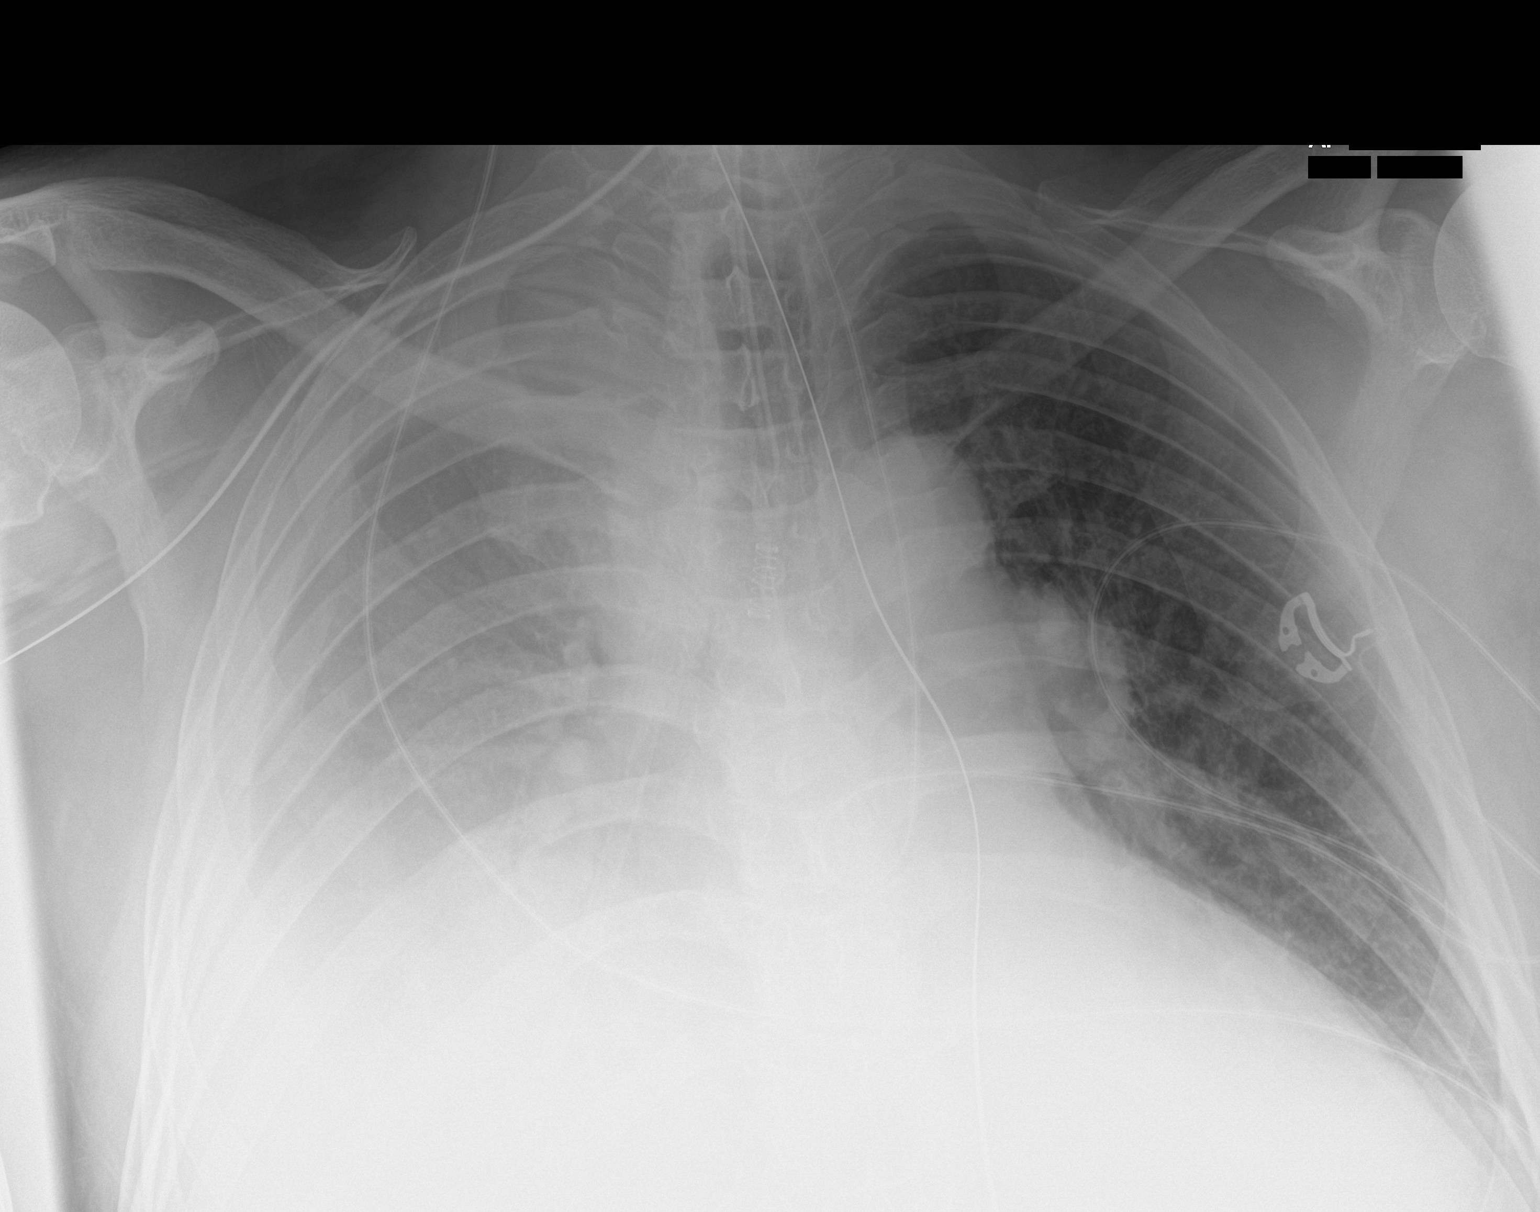

[1 of 1 positions shown; findings below may reference images not displayed]

FINDINGS: Endotracheal tube in the mid trachea 3.3 cm from the carina.

Transesophageal tube tip and side port terminate below the margins
of chest imaging, with the terminus on the abdominal radiograph in
the region of the gastric body.

Telemetry leads and external support devices overlie the chest and
abdomen

Some increasing gradient opacity is noted throughout the right
hemithorax with some lateral pleural thickening suggestive of a
developing pleural effusion. Persistent basilar regions of
consolidation compatible with the right middle and lower lobe
airspace disease with bubbly lucencies related to underlying
emphysematous change and associated air bronchograms, as seen on
comparison CT imaging. Left lung remains largely clear aside from
some atelectatic change and vascular crowding. No left effusion. No
visible pneumothorax.

Abdominal radiograph encompasses only portion of the upper abdomen
with several air-filled loops albeit without high-grade obstructive
pattern. No suspicious calcifications.

No acute osseous abnormality or suspicious osseous lesion.
IMPRESSION: 1. Endotracheal tube in the mid trachea 3.3 cm from the carina.
2. Transesophageal tube appropriately terminates in the region of
the gastric body with side port distal to the GE junction.
3. Persistent right middle and lower lobe airspace disease.
4. Suspect developing right pleural effusion.

## 2020-08-28 MED ORDER — PROPOFOL 500 MG/50ML IV EMUL
INTRAVENOUS | Status: AC
  Administered 2020-08-28: 100 mg via INTRAVENOUS
  Filled 2020-08-28: qty 50

## 2020-08-28 MED ORDER — ADULT MULTIVITAMIN LIQUID CH
15.0000 mL | Freq: Every day | ORAL | Status: DC
  Administered 2020-08-29 – 2020-09-23 (×26): 15 mL
  Filled 2020-08-28 (×26): qty 15

## 2020-08-28 MED ORDER — SODIUM CHLORIDE 0.9 % IV SOLN
250.0000 mL | INTRAVENOUS | Status: DC
  Administered 2020-08-29 – 2020-09-14 (×5): 250 mL via INTRAVENOUS

## 2020-08-28 MED ORDER — KETOROLAC TROMETHAMINE 30 MG/ML IJ SOLN
30.0000 mg | Freq: Once | INTRAMUSCULAR | Status: AC
  Administered 2020-08-28: 30 mg via INTRAVENOUS
  Filled 2020-08-28: qty 1

## 2020-08-28 MED ORDER — DM-GUAIFENESIN ER 30-600 MG PO TB12
1.0000 | ORAL_TABLET | Freq: Two times a day (BID) | ORAL | Status: DC
  Administered 2020-08-28: 1 via ORAL
  Filled 2020-08-28: qty 1

## 2020-08-28 MED ORDER — VANCOMYCIN HCL 2000 MG/400ML IV SOLN
2000.0000 mg | Freq: Once | INTRAVENOUS | Status: AC
  Administered 2020-08-28 – 2020-08-29 (×2): 2000 mg via INTRAVENOUS
  Filled 2020-08-28: qty 400

## 2020-08-28 MED ORDER — PROPOFOL 1000 MG/100ML IV EMUL
INTRAVENOUS | Status: AC
  Filled 2020-08-28: qty 100

## 2020-08-28 MED ORDER — MIDAZOLAM HCL 2 MG/2ML IJ SOLN
INTRAMUSCULAR | Status: AC
  Administered 2020-08-28: 2 mg
  Filled 2020-08-28: qty 4

## 2020-08-28 MED ORDER — SODIUM CHLORIDE 0.9 % IV SOLN: 1.0000 g | INTRAVENOUS | Status: DC

## 2020-08-28 MED ORDER — IOHEXOL 9 MG/ML PO SOLN
500.0000 mL | ORAL | Status: AC
  Administered 2020-08-28 (×2): 500 mL via ORAL

## 2020-08-28 MED ORDER — PROPOFOL 10 MG/ML IV BOLUS: 100.0000 mg | Freq: Once | INTRAVENOUS | Status: AC

## 2020-08-28 MED ORDER — FENTANYL CITRATE (PF) 100 MCG/2ML IJ SOLN
INTRAMUSCULAR | Status: AC
  Administered 2020-08-28: 100 ug
  Filled 2020-08-28: qty 2

## 2020-08-28 MED ORDER — SODIUM CHLORIDE 0.9 % IV BOLUS
1000.0000 mL | Freq: Once | INTRAVENOUS | Status: AC
  Administered 2020-08-28: 1000 mL via INTRAVENOUS

## 2020-08-28 MED ORDER — CHLORHEXIDINE GLUCONATE CLOTH 2 % EX PADS
6.0000 | MEDICATED_PAD | Freq: Every day | CUTANEOUS | Status: DC
  Administered 2020-08-29 – 2020-09-28 (×32): 6 via TOPICAL

## 2020-08-28 MED ORDER — ORAL CARE MOUTH RINSE
15.0000 mL | OROMUCOSAL | Status: DC
  Administered 2020-08-28 – 2020-09-24 (×254): 15 mL via OROMUCOSAL

## 2020-08-28 MED ORDER — FENTANYL BOLUS VIA INFUSION
50.0000 ug | INTRAVENOUS | Status: DC | PRN
  Administered 2020-08-28 – 2020-08-30 (×26): 50 ug via INTRAVENOUS
  Filled 2020-08-28: qty 50

## 2020-08-28 MED ORDER — IOHEXOL 300 MG/ML  SOLN
100.0000 mL | Freq: Once | INTRAMUSCULAR | Status: AC | PRN
  Administered 2020-08-28: 100 mL via INTRAVENOUS

## 2020-08-28 MED ORDER — VANCOMYCIN HCL 1250 MG/250ML IV SOLN
1250.0000 mg | Freq: Two times a day (BID) | INTRAVENOUS | Status: DC
  Administered 2020-08-29 – 2020-08-30 (×2): 1250 mg via INTRAVENOUS
  Filled 2020-08-28 (×3): qty 250

## 2020-08-28 MED ORDER — MOMETASONE FURO-FORMOTEROL FUM 200-5 MCG/ACT IN AERO
2.0000 | INHALATION_SPRAY | Freq: Two times a day (BID) | RESPIRATORY_TRACT | Status: DC
  Filled 2020-08-28: qty 8.8

## 2020-08-28 MED ORDER — SODIUM CHLORIDE 0.9 % IV SOLN
500.0000 mg | INTRAVENOUS | Status: DC
  Administered 2020-08-29 – 2020-08-31 (×3): 500 mg via INTRAVENOUS
  Filled 2020-08-28 (×3): qty 500

## 2020-08-28 MED ORDER — PHENYLEPHRINE 40 MCG/ML (10ML) SYRINGE FOR IV PUSH (FOR BLOOD PRESSURE SUPPORT)
120.0000 ug | PREFILLED_SYRINGE | Freq: Once | INTRAVENOUS | Status: AC
  Administered 2020-08-28: 120 ug via INTRAVENOUS

## 2020-08-28 MED ORDER — PIPERACILLIN-TAZOBACTAM 3.375 G IVPB
3.3750 g | Freq: Three times a day (TID) | INTRAVENOUS | Status: DC
  Administered 2020-08-29 – 2020-08-31 (×8): 3.375 g via INTRAVENOUS
  Filled 2020-08-28 (×8): qty 50

## 2020-08-28 MED ORDER — FENTANYL CITRATE (PF) 100 MCG/2ML IJ SOLN
INTRAMUSCULAR | Status: AC
  Administered 2020-08-28: 100 ug via INTRAVENOUS
  Filled 2020-08-28: qty 2

## 2020-08-28 MED ORDER — MIDAZOLAM HCL 2 MG/2ML IJ SOLN
2.0000 mg | INTRAMUSCULAR | Status: AC | PRN
  Administered 2020-08-28 (×3): 2 mg via INTRAVENOUS
  Filled 2020-08-28 (×2): qty 2

## 2020-08-28 MED ORDER — ACETAMINOPHEN 650 MG RE SUPP
650.0000 mg | Freq: Four times a day (QID) | RECTAL | Status: DC | PRN
  Filled 2020-08-28: qty 1

## 2020-08-28 MED ORDER — FENTANYL CITRATE (PF) 100 MCG/2ML IJ SOLN
100.0000 ug | Freq: Once | INTRAMUSCULAR | Status: AC
  Administered 2020-08-28: 100 ug via INTRAVENOUS

## 2020-08-28 MED ORDER — LACTATED RINGERS IV BOLUS (SEPSIS): 1000.0000 mL | Freq: Once | INTRAVENOUS | Status: DC

## 2020-08-28 MED ORDER — FENTANYL 2500MCG IN NS 250ML (10MCG/ML) PREMIX INFUSION
0.0000 ug/h | INTRAVENOUS | Status: DC
  Administered 2020-08-28: 25 ug/h via INTRAVENOUS
  Filled 2020-08-28: qty 250

## 2020-08-28 MED ORDER — MIDAZOLAM HCL 2 MG/2ML IJ SOLN
INTRAMUSCULAR | Status: AC
  Administered 2020-08-28: 2 mg
  Filled 2020-08-28: qty 2

## 2020-08-28 MED ORDER — THIAMINE HCL 100 MG PO TABS
100.0000 mg | ORAL_TABLET | Freq: Every day | ORAL | Status: DC
  Administered 2020-08-29 – 2020-09-25 (×28): 100 mg
  Filled 2020-08-28 (×28): qty 1

## 2020-08-28 MED ORDER — CHLORHEXIDINE GLUCONATE 0.12% ORAL RINSE (MEDLINE KIT)
15.0000 mL | Freq: Two times a day (BID) | OROMUCOSAL | Status: DC
  Administered 2020-08-29 – 2020-09-24 (×52): 15 mL via OROMUCOSAL

## 2020-08-28 MED ORDER — BUPRENORPHINE HCL-NALOXONE HCL 8-2 MG SL SUBL: 2.0000 | SUBLINGUAL_TABLET | Freq: Every day | SUBLINGUAL | Status: DC

## 2020-08-28 MED ORDER — MIDAZOLAM HCL 2 MG/2ML IJ SOLN
2.0000 mg | INTRAMUSCULAR | Status: DC | PRN
  Administered 2020-08-28 – 2020-09-01 (×17): 2 mg via INTRAVENOUS
  Filled 2020-08-28 (×19): qty 2

## 2020-08-28 MED ORDER — FENTANYL CITRATE (PF) 100 MCG/2ML IJ SOLN: 100.0000 ug | Freq: Once | INTRAMUSCULAR | Status: AC

## 2020-08-28 MED ORDER — LACTATED RINGERS IV BOLUS (SEPSIS)
1000.0000 mL | Freq: Once | INTRAVENOUS | Status: AC
  Administered 2020-08-28: 1000 mL via INTRAVENOUS

## 2020-08-28 MED ORDER — BUPRENORPHINE HCL-NALOXONE HCL 8-2 MG SL SUBL
2.0000 | SUBLINGUAL_TABLET | Freq: Every day | SUBLINGUAL | Status: DC
Start: 2020-08-28 — End: 2020-08-29
  Administered 2020-08-28: 2 via SUBLINGUAL
  Filled 2020-08-28 (×2): qty 1
  Filled 2020-08-28: qty 2

## 2020-08-28 MED ORDER — LORAZEPAM 2 MG/ML IJ SOLN: 1.0000 mg | INTRAMUSCULAR | Status: DC | PRN

## 2020-08-28 MED ORDER — DOCUSATE SODIUM 50 MG/5ML PO LIQD
100.0000 mg | Freq: Two times a day (BID) | ORAL | Status: DC
  Administered 2020-08-29 (×2): 100 mg
  Filled 2020-08-28 (×2): qty 10

## 2020-08-28 MED ORDER — FENTANYL CITRATE (PF) 100 MCG/2ML IJ SOLN
50.0000 ug | Freq: Once | INTRAMUSCULAR | Status: AC
  Administered 2020-08-28: 50 ug via INTRAVENOUS

## 2020-08-28 MED ORDER — ACETAMINOPHEN 325 MG PO TABS
650.0000 mg | ORAL_TABLET | Freq: Four times a day (QID) | ORAL | Status: DC | PRN
  Administered 2020-08-28 – 2020-08-29 (×3): 650 mg via ORAL
  Filled 2020-08-28 (×3): qty 2

## 2020-08-28 MED ORDER — ALBUTEROL SULFATE (2.5 MG/3ML) 0.083% IN NEBU
2.5000 mg | INHALATION_SOLUTION | Freq: Four times a day (QID) | RESPIRATORY_TRACT | Status: DC | PRN
  Administered 2020-08-28 – 2020-09-17 (×15): 2.5 mg via RESPIRATORY_TRACT
  Filled 2020-08-28 (×14): qty 3

## 2020-08-28 MED ORDER — FOLIC ACID 1 MG PO TABS
1.0000 mg | ORAL_TABLET | Freq: Every day | ORAL | Status: DC
  Administered 2020-08-29 – 2020-09-25 (×28): 1 mg
  Filled 2020-08-28 (×28): qty 1

## 2020-08-28 MED ORDER — MIDAZOLAM HCL 2 MG/2ML IJ SOLN: 4.0000 mg | Freq: Once | INTRAMUSCULAR | Status: AC

## 2020-08-28 MED ORDER — ROCURONIUM BROMIDE 10 MG/ML (PF) SYRINGE
PREFILLED_SYRINGE | INTRAVENOUS | Status: AC
  Administered 2020-08-28: 100 mg via INTRAVENOUS
  Filled 2020-08-28: qty 10

## 2020-08-28 MED ORDER — ORAL CARE MOUTH RINSE
15.0000 mL | Freq: Two times a day (BID) | OROMUCOSAL | Status: DC
  Administered 2020-08-28: 15 mL via OROMUCOSAL

## 2020-08-28 MED ORDER — ENOXAPARIN SODIUM 40 MG/0.4ML ~~LOC~~ SOLN
40.0000 mg | SUBCUTANEOUS | Status: DC
  Administered 2020-08-29: 40 mg via SUBCUTANEOUS
  Filled 2020-08-28 (×2): qty 0.4

## 2020-08-28 MED ORDER — FENTANYL 2500MCG IN NS 250ML (10MCG/ML) PREMIX INFUSION
50.0000 ug/h | INTRAVENOUS | Status: DC
  Administered 2020-08-29 (×3): 200 ug/h via INTRAVENOUS
  Administered 2020-08-30: 125 ug/h via INTRAVENOUS
  Filled 2020-08-28 (×4): qty 250

## 2020-08-28 MED ORDER — ROCURONIUM BROMIDE 10 MG/ML (PF) SYRINGE: 100.0000 mg | PREFILLED_SYRINGE | Freq: Once | INTRAVENOUS | Status: AC

## 2020-08-28 MED ORDER — DEXTROSE 50 % IV SOLN
INTRAVENOUS | Status: AC
  Administered 2020-08-28: 50 mL
  Filled 2020-08-28: qty 50

## 2020-08-28 MED ORDER — PROPOFOL 1000 MG/100ML IV EMUL
5.0000 ug/kg/min | INTRAVENOUS | Status: DC
  Administered 2020-08-29: 65 ug/kg/min via INTRAVENOUS
  Administered 2020-08-29: 35 ug/kg/min via INTRAVENOUS
  Administered 2020-08-29: 60 ug/kg/min via INTRAVENOUS
  Administered 2020-08-29 (×2): 40 ug/kg/min via INTRAVENOUS
  Administered 2020-08-29: 30 ug/kg/min via INTRAVENOUS
  Administered 2020-08-30 (×4): 40 ug/kg/min via INTRAVENOUS
  Administered 2020-08-30: 30 ug/kg/min via INTRAVENOUS
  Administered 2020-08-31: 50 ug/kg/min via INTRAVENOUS
  Administered 2020-08-31 (×2): 40 ug/kg/min via INTRAVENOUS
  Administered 2020-08-31: 30 ug/kg/min via INTRAVENOUS
  Administered 2020-08-31: 40 ug/kg/min via INTRAVENOUS
  Administered 2020-08-31 – 2020-09-01 (×3): 50 ug/kg/min via INTRAVENOUS
  Filled 2020-08-28: qty 200
  Filled 2020-08-28 (×4): qty 100
  Filled 2020-08-28: qty 200
  Filled 2020-08-28 (×2): qty 100
  Filled 2020-08-28: qty 200
  Filled 2020-08-28 (×10): qty 100

## 2020-08-28 MED ORDER — SODIUM CHLORIDE 0.9 % IV BOLUS: 500.0000 mL | Freq: Once | INTRAVENOUS | Status: DC

## 2020-08-28 MED ORDER — POLYETHYLENE GLYCOL 3350 17 G PO PACK
17.0000 g | PACK | Freq: Every day | ORAL | Status: DC
  Administered 2020-08-29: 17 g
  Filled 2020-08-28: qty 1

## 2020-08-28 MED ORDER — DEXMEDETOMIDINE HCL IN NACL 200 MCG/50ML IV SOLN
0.2000 ug/kg/h | INTRAVENOUS | Status: DC
  Administered 2020-08-28: 0.2 ug/kg/h via INTRAVENOUS
  Filled 2020-08-28: qty 50

## 2020-08-28 MED ORDER — KETOROLAC TROMETHAMINE 30 MG/ML IJ SOLN: 30.0000 mg | Freq: Three times a day (TID) | INTRAMUSCULAR | Status: DC | PRN

## 2020-08-28 MED ORDER — PHENYLEPHRINE HCL-NACL 10-0.9 MG/250ML-% IV SOLN
25.0000 ug/min | INTRAVENOUS | Status: DC
  Administered 2020-08-28: 25 ug/min via INTRAVENOUS
  Administered 2020-08-29: 115 ug/min via INTRAVENOUS
  Administered 2020-08-29: 175 ug/min via INTRAVENOUS
  Filled 2020-08-28 (×4): qty 250

## 2020-08-28 MED ORDER — ETOMIDATE 2 MG/ML IV SOLN
INTRAVENOUS | Status: AC
  Filled 2020-08-28: qty 20

## 2020-08-28 NOTE — Progress Notes (Signed)
Pharmacy Antibiotic Note  Joseph Ayala is a 55 y.o. male with hx necrotizing myopathy and opioid use disorder on Suboxone presented to the ED on 08/27/2020 with c/o CP. Abdominal CT on 2/20 showed findings consistent with PNA. Pharmacy has been consulted to dose vancomycin and zosyn for PNA.  Plan: - d/c ceftriaxone - zosyn 3.375 gm IV q8h (infuse over 4 hrs) - vancomycin 2000 mg IV x1, then 1250 mg IV q12h for est AUC 522 - daily scr ______________________________________  Height: 5\' 11"  (180.3 cm) Weight: 96.8 kg (213 lb 6.5 oz) IBW/kg (Calculated) : 75.3  Temp (24hrs), Avg:99.1 F (37.3 C), Min:97.1 F (36.2 C), Max:100.8 F (38.2 C)  Recent Labs  Lab 08/27/20 1953 08/28/20 0348 08/28/20 1035 08/28/20 1341 08/28/20 1645  WBC 11.9* 7.4  --  8.0  --   CREATININE 0.63  --   --   --   --   LATICACIDVEN  --  2.3* 2.6*  --  4.3*    Estimated Creatinine Clearance: 126.7 mL/min (by C-G formula based on SCr of 0.63 mg/dL).    Allergies  Allergen Reactions  . Vicodin [Hydrocodone-Acetaminophen] Nausea And Vomiting     Thank you for allowing pharmacy to be a part of this patient's care.  08/30/20 08/28/2020 6:18 PM

## 2020-08-28 NOTE — Progress Notes (Signed)
Patient is on red mews for elevated pulse rate and respirations. Charge nurse and NP were made aware. Later, escalated and Admitting MD and rapid response were also notified due to pt desating to 60's. Pt transferred to ICU stepdown.  08/28/20 2353  Assess: MEWS Score  Temp 99.6 F (37.6 C)  BP 126/79  Pulse Rate (!) 117  Resp (!) 28  SpO2 92 %  Assess: MEWS Score  MEWS Temp 0  MEWS Systolic 0  MEWS Pulse 2  MEWS RR 2  MEWS LOC 0  MEWS Score 4  MEWS Score Color Red  Assess: if the MEWS score is Yellow or Red  Were vital signs taken at a resting state? Yes  Focused Assessment No change from prior assessment  Early Detection of Sepsis Score *See Row Information* Low  MEWS guidelines implemented *See Row Information* Yes  Treat  MEWS Interventions Escalated (See documentation below)  Take Vital Signs  Increase Vital Sign Frequency  Red: Q 1hr X 4 then Q 4hr X 4, if remains red, continue Q 4hrs  Notify: Charge Nurse/RN  Name of Charge Nurse/RN Notified Chris, RN  Date Charge Nurse/RN Notified 08/28/20  Time Charge Nurse/RN Notified [id number]  Notify: Provider  Provider Name/Title Norm Parcel  Date Provider Notified 08/28/20  Time Provider Notified [phone]  Notification Type Page  Notification Reason Change in status  Provider response No new orders  Notify: Rapid Response  Name of Rapid Response RN Notified Lannette Donath, RN  Date Rapid Response Notified 08/28/20  Time Rapid Response Notified 0630  Document  Patient Outcome Transferred/level of care increased  Progress note created (see row info) Yes

## 2020-08-28 NOTE — ED Notes (Signed)
Pt removed self from O2 and BP monitoring. Denies shortness of breath at this time, SpO2 noted on room air at 94%. Will leave off oxygen for now. carelink notified.

## 2020-08-28 NOTE — Progress Notes (Signed)
Pt was in respiratory distress. Dr. Loanne Drilling order pt to be intubated. RT called and RSI kit prepared. MD gave verbal orders for medications at bedside as needed: 1823 50 mcg of Fent & 1 mg versed given.  1824 50 fent given & 40 mcg Neo.  1827 40 mcg of Neo & 50 of Fent  1828- 1 versed 1830- 50 mg of prop 1832- 100 mg of roc & 50 mg of prop 1834- intubation done 1836- 40 mcg of Neo & 50 mcg of fent bolus from bag 1850- 2 mg of versed  1852- fent boluses given back to back per MD at bedside & drip increased to max amount per MD at bedside

## 2020-08-28 NOTE — Progress Notes (Signed)
This is a 55 year old gentleman with a history of hypertension, necrotizing myopathy, low testosterone, opioid use disorder on Suboxone who presented to ED with a complaint of right-sided chest pain, cough and shortness of breath and was admitted under hospitalist service early this morning secondary to sepsis and acute hypoxic respiratory failure due to community-acquired pneumonia.  He was requiring only 2 L of oxygen in the beginning and was tachycardic and tachypneic with leukocytosis.  CT showed demonstration of mixed consolidative and groundglass opacity with septal thickening in the right middle and lower lobe.  He was tested negative for COVID-19.  He was started on Rocephin and Zithromax and received appropriate IV fluid bolus per sepsis protocol in the ED.  CT angiogram of the chest ruled out PE.  Cardiac enzymes x2 was negative so far.  Early in the morning, almost 10 minutes before 7, rapid response was called because patient went into respiratory failure requiring nonrebreather.  He was evaluated by night hospitalist as well.  I personally evaluated this patient around 7:30 AM as well.  Patient clinically looks much much better than how he looks on the chart.  He was not tachypneic.  He was on nonrebreather with speaking full sentences and denied shortness of breath and in fact he stated that his breathing is much better than how he came in initially in the ED.  Based on the findings of faint consolidation on the right side, this raises suspicion for possible mucous plug.  I have consulted PCCM.  Await and assess him for possible need of bronchoscopy.  On examination, he did have coarse breath sounds with end expiratory wheezes and rhonchi bilaterally but more on the right side. Patient's ABG is also very reassuring.

## 2020-08-28 NOTE — ED Notes (Signed)
Updated wife Ginger via telephone. Carelink at bedside

## 2020-08-28 NOTE — Consult Note (Addendum)
NAME:  Joseph Ayala, MRN:  [id number], DOB:  March 01, 1966, LOS: 0 ADMISSION DATE:  08/27/2020, CONSULTATION DATE:  08/28/20 REFERRING MD:  Hughie Closs, MD CHIEF COMPLAINT:  Acute hypoxemic respiratory failure  Brief History:  55 year old male on Suboxone for necrotizing myopathy who presents with acute hypoxemic respiratory failure secondary to community-acquired pneumonia/aspiration.  He was admitted on 08/28/2020 and shortly after taking his home suboxone, he became drowsy and found profoundly hypoxemic.  History of Present Illness:  [name] is a 55 year old male with history of necrotizing myopathy, prior history of opioid use disorder on Suboxone, hypertension who presented to the ED with cough, shortness of breath and chest pain.  He reports a 1 week history of productive cough, shortness of breath and associated substernal chest pain.  He is fully vaccinated against Covid including booster shot.  Admission Covid test negative  In the ED, he is afebrile but tachycardic and tachypneic.  O2 saturations were noted to be 88% on room air.  Admission chest x-ray with mild right lower lobe consolidation noted.  CTA obtained which was negative for PE however did show dense consolidation and groundglass opacities in the right middle and lower lobe as well as mild centrilobular emphysema.  Labs were significant for lactic acid 2.3 and BNP 114.  Otherwise blood counts and BMP overall unremarkable.  Troponin negative x2.  He was admitted to hospitalist service for acute hypoxemic respiratory failure/sepsis secondary to community-acquired pneumonia and started on antibiotics and 2 L oxygen.  Prior to shift change this morning he was found with O2 saturations in the 70s.  He was drowsy, tachypneic.  Rapid response was called and he was placed on nonrebreather.  Bedside CBG noted hypoglycemia to the 60s which improved with dextrose to 123.  EKG ordered which showed sinus tach.  Transfer to stepdown unit  and pulmonary consulted to evaluate for possible mucous plug requiring bronchoscopy.  In the SDU he is drowsy but more alert on NRB. Now that is more interactive, he reports that his shortness of breath has improved compared to earlier this morning. Now is having severe abdominal and back pain that is new as well as feeling like his belly is tight  Past Medical History:  Hypertension, necrotizing myopathy, opioid use on Suboxone  Significant Hospital Events:  2/19 Arrived to ED for cough and chest pain x 1 week 2/20 Admitted to [facility] in early am  Consults:  [facility] PCCM  Procedures:    Significant Diagnostic Tests:  CTA - right sided consolidation and ground glass opacities in the right and middle lobe. Background centrilobular emphysema present. No pulmonary embolism  Micro Data:  Blood culture 2/19>  Antimicrobials:  Ceftriaxone 2/20> Azithro 2/20>  Interim History / Subjective:    Objective   Blood pressure (!) 135/91, pulse (!) 117, temperature 99.6 F (37.6 C), temperature source Oral, resp. rate (!) 32, height 5\' 11"  (1.803 m), weight 96.8 kg, SpO2 92 %.        Intake/Output Summary (Last 24 hours) at 08/28/2020 1115 Last data filed at 08/27/2020 2153 Gross per 24 hour  Intake --  Output 225 ml  Net -225 ml   Filed Weights   08/27/20 1808 08/28/20 0214  Weight: 95.3 kg 96.8 kg    Physical Exam: General: Drowsy, well-appearing, no acute distress HENT: [place], AT, OP clear, MMM Eyes: EOMI, no scleral icterus Respiratory: Clear to auscultation bilaterally.  No crackles, wheezing or rales Cardiovascular: RRR, -M/R/G, no JVD GI: BS+, firm,  diffuse tender to palpation Extremities:-Edema,-tenderness Neuro: Drowsy, easily awakened, answer questions appropriately, follows commands, moves extremities x 4 Skin: Intact, no rashes or bruising Psych: Normal mood, normal affect  Resolved Hospital Problem list     Assessment & Plan:  Acute hypoxemic respiratory failure  secondary to community acquired pneumonia Aspiration pneumonia/pneumonitis in setting of altered mental status Community-acquired pneumonia Emphysema Chest imaging reviewed and after discussion with patient and his wife regarding his clinical presentation, his respiratory failure is likely related to community acquired pneumonia exacerbated by aspiration pneumonitis. His risk of aspiration increases when using his suboxone. He also recently has had inadequate sleep from helping him three litters of puppies this week as well as being ill. He arrived to the ICU/SDU on NRB but was able to be quickly weaned to 5L O2. His lung imaging do not show significant areas of lung collapse or atelectasis that would suggest significant secretions requiring bronchoscopy. He does have underlying emphysema probably related to his chronic cannabis use for his chronic back pain. For management of his respiratory symptoms, I recommend the following: --Continue CAP antibiotic coverage for five days --Continue Mucinex for seven days --START Dulera twice daily and plan to discharge on inhaler. He can be reassessed by PCP if he can be stepped down to PRN albuterol vs continuing ICS/LABA pending on his symptom improvement --Chest physiotherapy for 24-48 hours --Flutter valve  Necrotizing myopathy requiring suboxone Recently ran out of suboxone. Has taken OTC tylenol and ibuprofen for pain control --On suboxone --Minimize sedating PRN pain medications  New onset abdominal pain/lower back pain Lactic acidosis --CT abdomen/pelvis with contrast ordered --Hepatic function, lipase, amylase --Trend LA --IVF  EtOH Abuse Last drink possibly yesterday vs a few days ago per patient. He reports 9-10 beers daily. --CIWA protocol --Thiamine, folate, multivitamin  Best practice (evaluated daily)  Diet: NPO Pain/Anxiety/Delirium protocol (if indicated): N/A VAP protocol (if indicated): N/A DVT prophylaxis: Lovenox GI  prophylaxis: Per primary Glucose control: Per primary Mobility: As tolerated Disposition: SDU  Goals of Care:  Last date of multidisciplinary goals of care discussion: Unknown, per primary team Family and staff present:  Summary of discussion:  Follow up goals of care discussion due: Per primary team Code Status: Full code  Labs   CBC: Recent Labs  Lab 08/27/20 1953 08/28/20 0348  WBC 11.9* 7.4  HGB 15.3 14.7  HCT 43.9 43.5  MCV 91.3 93.5  PLT 166 152    Basic Metabolic Panel: Recent Labs  Lab 08/27/20 1953  NA 134*  K 3.8  CL 97*  CO2 29  GLUCOSE 125*  BUN 20  CREATININE 0.63  CALCIUM 9.2   GFR: Estimated Creatinine Clearance: 126.7 mL/min (by C-G formula based on SCr of 0.63 mg/dL). Recent Labs  Lab 08/27/20 1953 08/28/20 0348  WBC 11.9* 7.4  LATICACIDVEN  --  2.3*    Liver Function Tests: No results for input(s): AST, ALT, ALKPHOS, BILITOT, PROT, ALBUMIN in the last 168 hours. No results for input(s): LIPASE, AMYLASE in the last 168 hours. No results for input(s): AMMONIA in the last 168 hours.  ABG    Component Value Date/Time   PHART 7.462 (H) 08/28/2020 0804   PCO2ART 35.2 08/28/2020 0804   PO2ART 103 08/28/2020 0804   HCO3 24.8 08/28/2020 0804   O2SAT 97.7 08/28/2020 0804     Coagulation Profile: Recent Labs  Lab 08/28/20 0418  INR 1.2    Cardiac Enzymes: No results for input(s): CKTOTAL, CKMB, CKMBINDEX, TROPONINI in the last  168 hours.  HbA1C: No results found for: HGBA1C  CBG: Recent Labs  Lab 08/28/20 0631 08/28/20 0645  GLUCAP 67* 123*    Review of Systems:   Review of Systems  Constitutional: Negative for chills, diaphoresis, fever, malaise/fatigue and weight loss.  HENT: Negative for congestion, ear pain and sore throat.   Respiratory: Positive for cough, sputum production and shortness of breath. Negative for hemoptysis and wheezing.   Cardiovascular: Negative for chest pain, palpitations and leg swelling.   Gastrointestinal: Negative for abdominal pain, heartburn and nausea.  Genitourinary: Negative for frequency.  Musculoskeletal: Negative for joint pain and myalgias.  Skin: Negative for itching and rash.  Neurological: Negative for dizziness, weakness and headaches.  Endo/Heme/Allergies: Does not bruise/bleed easily.  Psychiatric/Behavioral: Negative for depression. The patient is not nervous/anxious.      Past Medical History:  He,  has a past medical history of Degenerative disc disease, lumbar, Dental crown present, History of kidney stones, Hypertension, Muscle atrophy (01/2018), and Muscle weakness (01/2018).   Surgical History:   Past Surgical History:  Procedure Laterality Date  . ENDOVENOUS ABLATION SAPHENOUS VEIN W/ LASER Right 12-29-2015   endovenous laser ablation right greater saphenous vein, stab phlebectomy > 20 incisions right leg, sclerotherapy right leg by Gretta Began MD    . MINOR MUSCLE BIOPSY Right 02/11/2018   Procedure: RECTUS FEMORIS MUSCLE BIOPSY;  Surgeon: Darnell Level, MD;  Location: [facility];  Service: General;  Laterality: Right;  . MUSCLE BIOPSY Right 02/11/2018   Procedure: DELTOID MUSCLE BIOPSY;  Surgeon: Darnell Level, MD;  Location: [facility];  Service: General;  Laterality: Right;  . NASAL FRACTURE SURGERY       Social History:   reports that he has never smoked. He has quit using smokeless tobacco. He reports current alcohol use. He reports that he does not use drugs.   Family History:  His family history includes Varicose Veins in his brother.   Allergies Allergies  Allergen Reactions  . Vicodin [Hydrocodone-Acetaminophen] Nausea And Vomiting     Home Medications  Prior to Admission medications   Medication Sig Start Date End Date Taking? Authorizing Provider  Buprenorphine HCl-Naloxone HCl 8-2 MG FILM Take 2 and 1/2 films sublingual daily 06/20/20  Yes [name], MD  testosterone cypionate  (DEPOTESTOSTERONE CYPIONATE) 200 MG/ML injection Inject 2 mLs (400 mg total) into the muscle every 14 (fourteen) days. Patient not taking: Reported on 08/28/2020 05/05/20   [name], MD     Critical care time: 35 min    The patient is critically ill with multiple organ systems failure and requires high complexity decision making for assessment and support, frequent evaluation and titration of therapies, application of advanced monitoring technologies and extensive interpretation of multiple databases.    Mechele Collin, M.D. [facility] Pulmonary/Critical Care Medicine 08/28/2020 11:15 AM   Please see Amion for pager number to reach on-call Pulmonary and Critical Care Team.

## 2020-08-28 NOTE — Progress Notes (Signed)
Received patient from [facility] at 0156. Patient is AAOx4. Stable sats at 96 on 2L via [place]. Patient is complaining of right upper chest pain, requesting medication. Admitting MD notified via amion. Oriented to room and call bell placed within reach.

## 2020-08-28 NOTE — Procedures (Signed)
Intubation Procedure Note  Zia Kanner Hildenbrand  [id number]  1966-03-22  Date:08/28/20  Time:6:46 PM   Provider Performing:Meital Riehl Mechele Collin    Procedure: Intubation (31500)  Indication(s) Respiratory Failure  Consent Risks of the procedure as well as the alternatives and risks of each were explained to the patient and/or caregiver.  Consent for the procedure was obtained and is signed in the bedside chart   Anesthesia Versed, Fentanyl, Rocuronium and Propofol   Time Out Verified patient identification, verified procedure, site/side was marked, verified correct patient position, special equipment/implants available, medications/allergies/relevant history reviewed, required imaging and test results available.   Sterile Technique Usual hand hygeine, masks, and gloves were used   Procedure Description Patient positioned in bed supine.  Sedation given as noted above.  Patient was intubated with endotracheal tube using Glidescope.  View was Grade 1 full glottis .  Number of attempts was 1.  Colorimetric CO2 detector was consistent with tracheal placement.   Complications/Tolerance None; patient tolerated the procedure well. Chest X-ray is ordered to verify placement.   EBL Minimal   Specimen(s) None

## 2020-08-28 NOTE — Progress Notes (Signed)
eLink Physician-Brief Progress Note Patient Name: Excell Neyland Schuur DOB: 03/29/1966 MRN: [id number]   Date of Service  08/28/2020  HPI/Events of Note  Nursing request for OGT and Foley Catheter.   eICU Interventions  Will order OGT and Foley Catheter.      Intervention Category Major Interventions: Other:  Lenell Antu 08/28/2020, 8:04 PM

## 2020-08-28 NOTE — H&P (Signed)
History and Physical    Joseph Ayala NGE:[id number] DOB: [birth year] DOA: 08/27/2020  PCP: [name], MD Patient coming from: [facility] ED  Chief Complaint: Chest pain  HPI: Joseph Ayala is a 55 y.o. male with medical history significant of hypertension, necrotizing myopathy, low testosterone, opioid use disorder on Suboxone presented to the ED with complaints of right-sided chest pain, cough, and shortness of breath.  Patient reports 1 week history of productive cough, dyspnea, and sharp substernal chest pain.  Reports having chills.  He is fully vaccinated against COVID including booster shot.  Denies nausea, vomiting, abdominal pain, or diarrhea.  States he has not used oxycodone for several years and takes Suboxone but ran out several days ago.  He was moaning and no additional history could be obtained.  ED Course: Afebrile.  Tachycardic and tachypneic.  Not hypotensive.  Oxygen saturation 88-91% on room air, placed on 2 L supplemental oxygen.  WBC 11.9, hemoglobin 15.3, platelet count 166K.  Sodium 134, potassium 3.8, chloride 97, bicarb 29, BUN 20, creatinine 0.6, glucose 125.  High-sensitivity troponin negative.  EKG not suggestive of ACS.  SARS-CoV-2 PCR test negative.  Chest x-ray showing right lower lobe consolidation/bronchopneumonia.  CT angiogram chest negative for PE.  Showing dense regions of mixed consolidative and groundglass opacity with septal thickening in the right middle and lower lobe.  Also showing ascending thoracic aortic aneurysm, coronary artery calcifications, and aortic atherosclerosis. Medications administered include albuterol inhaler treatment, fentanyl, morphine, prednisone, ceftriaxone, and azithromycin.  Review of Systems:  All systems reviewed and apart from history of presenting illness, are negative.  Past Medical History:  Diagnosis Date  . Degenerative disc disease, lumbar   . Dental crown present   . History of kidney stones    . Hypertension    states under control with meds., has been on med. x 1 yr. - is currently out of med., to see PCP 02/06/2018  . Muscle atrophy 01/2018  . Muscle weakness 01/2018    Past Surgical History:  Procedure Laterality Date  . ENDOVENOUS ABLATION SAPHENOUS VEIN W/ LASER Right 12-29-2015   endovenous laser ablation right greater saphenous vein, stab phlebectomy > 20 incisions right leg, sclerotherapy right leg by Gretta Began MD    . MINOR MUSCLE BIOPSY Right 02/11/2018   Procedure: RECTUS FEMORIS MUSCLE BIOPSY;  Surgeon: Darnell Level, MD;  Location: [facility];  Service: General;  Laterality: Right;  . MUSCLE BIOPSY Right 02/11/2018   Procedure: DELTOID MUSCLE BIOPSY;  Surgeon: Darnell Level, MD;  Location: [facility];  Service: General;  Laterality: Right;  . NASAL FRACTURE SURGERY       reports that he has never smoked. He has quit using smokeless tobacco. He reports current alcohol use. He reports that he does not use drugs.  Allergies  Allergen Reactions  . Vicodin [Hydrocodone-Acetaminophen] Nausea And Vomiting    Family History  Problem Relation Age of Onset  . Varicose Veins Brother     Prior to Admission medications   Medication Sig Start Date End Date Taking? Authorizing Provider  Buprenorphine HCl-Naloxone HCl 8-2 MG FILM Take 2 and 1/2 films sublingual daily 06/20/20  Yes [name], MD  testosterone cypionate (DEPOTESTOSTERONE CYPIONATE) 200 MG/ML injection Inject 2 mLs (400 mg total) into the muscle every 14 (fourteen) days. Patient not taking: Reported on 08/28/2020 05/05/20   [name], MD    Physical Exam: Vitals:   08/28/20 0030 08/28/20 4010  08/28/20 0214 08/28/20 0404  BP: 113/83 113/83 110/86 108/75  Pulse: (!) 109 (!) 106 (!) 108 (!) 106  Resp:  (!) 25 (!) 26 (!) 24  Temp:  (!) 97.1 F (36.2 C) 98.4 F (36.9 C) 99.7 F (37.6 C)  TempSrc:  Axillary Oral Oral  SpO2: 95% 94% 92% 99%  Weight:    96.8 kg   Height:   5\' 11"  (1.803 m)     Physical Exam Constitutional:      Appearance: He is toxic-appearing and diaphoretic.  HENT:     Head: Normocephalic and atraumatic.  Eyes:     Extraocular Movements: Extraocular movements intact.     Conjunctiva/sclera: Conjunctivae normal.  Cardiovascular:     Rate and Rhythm: Normal rate and regular rhythm.     Pulses: Normal pulses.  Pulmonary:     Effort: Pulmonary effort is normal.     Breath sounds: No wheezing.     Comments: Coarse breath sounds Abdominal:     General: Bowel sounds are normal. There is no distension.     Palpations: Abdomen is soft.     Tenderness: There is no abdominal tenderness.  Musculoskeletal:        General: No swelling or tenderness.     Cervical back: Normal range of motion and neck supple.  Skin:    General: Skin is warm.  Neurological:     General: No focal deficit present.     Mental Status: He is alert and oriented to person, place, and time.     Labs on Admission: I have personally reviewed following labs and imaging studies  CBC: Recent Labs  Lab 08/27/20 1953  WBC 11.9*  HGB 15.3  HCT 43.9  MCV 91.3  PLT 166   Basic Metabolic Panel: Recent Labs  Lab 08/27/20 1953  NA 134*  K 3.8  CL 97*  CO2 29  GLUCOSE 125*  BUN 20  CREATININE 0.63  CALCIUM 9.2   GFR: Estimated Creatinine Clearance: 126.7 mL/min (by C-G formula based on SCr of 0.63 mg/dL). Liver Function Tests: No results for input(s): AST, ALT, ALKPHOS, BILITOT, PROT, ALBUMIN in the last 168 hours. No results for input(s): LIPASE, AMYLASE in the last 168 hours. No results for input(s): AMMONIA in the last 168 hours. Coagulation Profile: No results for input(s): INR, PROTIME in the last 168 hours. Cardiac Enzymes: No results for input(s): CKTOTAL, CKMB, CKMBINDEX, TROPONINI in the last 168 hours. BNP (last 3 results) No results for input(s): PROBNP in the last 8760 hours. HbA1C: No results for input(s): HGBA1C in  the last 72 hours. CBG: No results for input(s): GLUCAP in the last 168 hours. Lipid Profile: No results for input(s): CHOL, HDL, LDLCALC, TRIG, CHOLHDL, LDLDIRECT in the last 72 hours. Thyroid Function Tests: No results for input(s): TSH, T4TOTAL, FREET4, T3FREE, THYROIDAB in the last 72 hours. Anemia Panel: No results for input(s): VITAMINB12, FOLATE, FERRITIN, TIBC, IRON, RETICCTPCT in the last 72 hours. Urine analysis:    Component Value Date/Time   APPEARANCEUR Clear 07/13/2019 1410   LABSPEC 1.025 01/31/2012 1441   PHURINE 6.0 01/31/2012 1441   GLUCOSEU Negative 07/13/2019 1410   HGBUR TRACE (A) 01/31/2012 1441   BILIRUBINUR Negative 07/13/2019 1410   KETONESUR negative 07/19/2018 0821   KETONESUR NEGATIVE 01/31/2012 1441   PROTEINUR Negative 07/13/2019 1410   PROTEINUR NEGATIVE 01/31/2012 1441   UROBILINOGEN 0.2 07/19/2018 0821   UROBILINOGEN 1.0 01/31/2012 1441   NITRITE Negative 07/13/2019 1410   NITRITE NEGATIVE 01/31/2012  1441   LEUKOCYTESUR Negative 07/13/2019 1410    Radiological Exams on Admission: CT Angio Chest PE W/Cm &/Or Wo Cm  Result Date: 08/27/2020 CLINICAL DATA:  Cough, chest congestion,, right-sided chest pain EXAM: CT ANGIOGRAPHY CHEST WITH CONTRAST TECHNIQUE: Multidetector CT imaging of the chest was performed using the standard protocol during bolus administration of intravenous contrast. Multiplanar CT image reconstructions and MIPs were obtained to evaluate the vascular anatomy. CONTRAST:  OMNIPAQUE IOHEXOL 350 MG/ML SOLN COMPARISON:  Radiograph 08/27/2020. FINDINGS: Cardiovascular: Satisfactory opacification the pulmonary arteries. No pulmonary artery filling defects are identified to the lobar level with more distal evaluation limited by respiratory motion artifact. Central pulmonary arteries are normal caliber. Normal heart size. No pericardial effusion. Coronary artery calcifications are present. The aortic root is suboptimally assessed given  cardiac pulsation artifact. Ascending thoracic aorta dilated to approximately 4.6 cm. Returning to a more normal caliber 2.9 cm by the level of the distal aortic arch. No acute luminal abnormality is seen. No periaortic stranding or hemorrhage. Normal 3 vessel branching of the aortic arch. Proximal great vessels are unremarkable. Mediastinum/Nodes: No mediastinal fluid or gas. Normal thyroid gland and thoracic inlet. No acute abnormality of the trachea or esophagus. Shotty subcentimeter mediastinal and hilar lymph nodes are present compatible with reactive adenopathy. No worrisome pathologically enlarged mediastinal, hilar or axillary adenopathy. Lungs/Pleura: Dense regions of mixed patchy consolidative and ground-glass opacity are present throughout the right middle and lower lobes with some associated interlobular septal thickening. Bubbly lucencies within the regions of more consolidative opacity are likely related to more diffuse centrilobular emphysematous changes seen elsewhere throughout the lungs. Left lung remains relatively clear aside from some mild atelectasis. No pneumothorax. No pleural fluid. Upper Abdomen: No acute abnormalities present in the visualized portions of the upper abdomen. Musculoskeletal: No acute osseous abnormality or suspicious osseous lesion. Multilevel degenerative changes are present in the imaged portions of the spine. No worrisome chest wall masses or lesions. Review of the MIP images confirms the above findings. IMPRESSION: 1. No pulmonary embolism to the lobar level with more distal assessment limited by respiratory motion artifact. 2. Dense regions of mixed consolidative and ground-glass opacity with septal thickening in the right middle and lower lobe compatible, can be seen in the setting atypical infection including influenza or other viral pneumonia, less likely asymmetric edema. 3. Ascending thoracic aortic aneurysm measuring up to 4.6 cm. No acute aortic abnormality is  seen. Ascending thoracic aortic aneurysm. Recommend semi-annual imaging followup by CTA or MRA and referral to cardiothoracic surgery if not already obtained. This recommendation follows 2010 ACCF/AHA/AATS/ACR/ASA/SCA/SCAI/SIR/STS/SVM Guidelines for the Diagnosis and Management of Patients With Thoracic Aortic Disease. Circulation. 2010; 121: D532-D924. Aortic aneurysm NOS (ICD10-I71.9) 4. Coronary artery calcifications are present. Please note that the presence of coronary artery calcium documents the presence of coronary artery disease, the severity of this disease and any potential stenosis cannot be assessed on this non-gated CT examination. 5. Aortic Atherosclerosis (ICD10-I70.0) 6. Emphysema (ICD10-J43.9). Electronically Signed   By: Kreg Shropshire M.D.   On: 08/27/2020 21:07   DG Chest Port 1 View  Result Date: 08/27/2020 CLINICAL DATA:  Cough and chest congestion. EXAM: PORTABLE CHEST 1 VIEW COMPARISON:  08/01/2009 FINDINGS: Heart size is normal. Tortuous aorta. The left lung is clear. There is right lower lobe consolidation/bronchopneumonia. No visible effusion. IMPRESSION: Right lower lobe consolidation/bronchopneumonia. Electronically Signed   By: Paulina Fusi M.D.   On: 08/27/2020 19:33    EKG: Independently reviewed.  Sinus tachycardia, baseline wander  in leads II and III, nonspecific T wave abnormality.  No significant change since prior tracing.  Assessment/Plan Principal Problem:   CAP (community acquired pneumonia) Active Problems:   Opioid use disorder   Acute hypoxemic respiratory failure (HCC)   Sepsis (HCC)   Chest pain   Acute hypoxemic respiratory failure Sepsis Community-acquired pneumonia SPO2 88-91% on room air in the ED, currently satting well on 2 L supplemental oxygen.  Tachycardic and tachypneic.  Labs showing mild leukocytosis.  CT showing dense regions of mixed consolidative and groundglass opacity with septal thickening in the right middle and lower lobe.   SARS-CoV-2 PCR test negative and he is fully vaccinated. -Continue ceftriaxone and azithromycin.  Order 30cc/kg fluid boluses per sepsis protocol.  No blood cultures ordered in the ED, will order now.  Check lactic acid level.  Continue to monitor WBC count.  Mucinex-DM for cough/congestion.  Continue supplemental oxygen, wean as tolerated.  Chest pain: Appears pleuritic, ongoing for a week.  CT angiogram negative for PE.  High-sensitivity troponin negative and EKG not suggestive of ACS. -Toradol as needed for pain, Tylenol as needed  Opioid use disorder Patient states he has not used opiates for several years and is on Suboxone but ran out several days ago.  Appears to be undergoing Suboxone withdrawal, diaphoretic and moaning. -Resume Suboxone  Hypertension: Stable.  Not on antihypertensives at home -Continue to monitor  Necrotizing myopathy: Diagnosed by neurology at [facility].  Previously tried on methotrexate but was not able to tolerate it due to side effects. -Outpatient neurology follow-up.  Ascending thoracic aortic aneurysm: Seen on CT and measuring up to 4.6 cm. -Will need semiannual imaging follow-up and referral to cardiothoracic surgery.  DVT prophylaxis: Lovenox Code Status: Full code Family Communication: No family available at this time. Disposition Plan: Status is: Inpatient  Remains inpatient appropriate because:IV treatments appropriate due to intensity of illness or inability to take PO and Inpatient level of care appropriate due to severity of illness   Dispo: The patient is from: Home              Anticipated d/c is to: Home              Anticipated d/c date is: 3 days              Patient currently is not medically stable to d/c.   Difficult to place patient No  Level of care: Level of care: Telemetry   The medical decision making on this patient was of high complexity and the patient is at high risk for clinical deterioration, therefore this is a  level 3 visit.  John Giovanni MD [facility]  If 7PM-7AM, please contact night-coverage [email or web address]  08/28/2020, 4:16 AM

## 2020-08-28 NOTE — Progress Notes (Signed)
PCCM Interval Progress Note  Worsening mental status. Abdominal imaging with no acute findings however denser consolidation noted. Patient high risk for aspiration with no reserve. Discussed intubation for airway protection with wife at bedside.  Will also broaden antibiotics and obtain BAL for cultures.  Rodman Pickle, M.D. [facility] Pulmonary/Critical Care Medicine 08/28/2020 7:00 PM

## 2020-08-28 NOTE — Progress Notes (Signed)
Notified by RN that after patient received approximately 500 cc IV fluid, he became tachypneic and his sats dropped to the 60s.  Rapid response was called and he was placed on nonrebreather.  Hypoglycemic with CBG in the 60s, improved to 123 with dextrose. Patient was immediately seen at bedside.  Resting comfortably and stated his breathing and chest pain are now better.  Satting well on nonrebreather.  Not hypotensive.  Coarse breath sounds appreciated upon auscultation of the lungs; no wheezing.  -Stat chest x-ray ordered and currently pending -Stat ABG ordered and currently pending -Advise RN to hold IV fluids.  Stat BNP ordered and currently pending -Stat EKG ordered and currently pending -Stat troponin ordered and currently pending -Transfer to stepdown unit. -Patient signed out to incoming daytime MD.

## 2020-08-28 NOTE — Progress Notes (Signed)
eLink Physician-Brief Progress Note Patient Name: Joseph Ayala DOB: 10/07/1965 MRN: [id number]   Date of Service  08/28/2020  HPI/Events of Note  Severe Agitation - Currently on Fentanyl and Precedex IV infusions.   eICU Interventions  Plan: 1. Propofol IV infusion. Titrate to RASS = 0 to -1.  2. D/C Precedex IV infusion.      Intervention Category Major Interventions: Delirium, psychosis, severe agitation - evaluation and management  Sommer,Steven Dennard Nip 08/28/2020, 8:19 PM

## 2020-08-28 NOTE — Progress Notes (Signed)
Date and time results received: 08/28/20 1300  Test: Lactic Acid  Critical Value: 2.6   Name of Provider Notified: Dr. Lorane Gell Orders Received? Or Actions Taken?: 2 L boluses of LR

## 2020-08-28 NOTE — Progress Notes (Signed)
eLink Physician-Brief Progress Note Patient Name: Joseph Ayala DOB: 05-18-1966 MRN: [id number]   Date of Service  08/28/2020  HPI/Events of Note  Multiple issues: 1. Lactic Acid = 4.3 --> 4.7, 2. Hypotension - BP = 50/36 with MAP = 38 , 3. Request to review x-rays for ETT and OGT placement - ETT tip 3.5 cm above carina and OGT tip in mid stomach. ETT and OGT positions are both acceptable. No CVL or CVP and 4. ABG on 100%/PRVC 18/TV 600/P 5 = 7.238/53.8/72.8  eICU Interventions  Plan: 1. Bolus with 0.9 NaCl 1 liter IV over 1 hour now. 2. Norepinephrine IV infusion via PIV. Titrate to MAP >= 65. 3. OK to use OGT.  4. Increase PRVC rate to 24. 5. Repeat ABG at 11 PM.      Intervention Category Major Interventions: Acid-Base disturbance - evaluation and management;Respiratory failure - evaluation and management;Hypotension - evaluation and management;Other:  Lenell Antu 08/28/2020, 9:49 PM

## 2020-08-28 NOTE — Procedures (Signed)
Bronchoscopy Procedure Note  Prynce Jacober Laprise  [id number]  1966-04-11  Date:08/28/20  Time:6:48 PM   Provider Performing:Harumi Yamin Rodman Pickle   Procedure(s):  Flexible bronchoscopy with bronchial alveolar lavage [phone])  Indication(s) Respiratory failure Abnormal CT  Consent Risks of the procedure as well as the alternatives and risks of each were explained to the patient and/or caregiver.  Consent for the procedure was obtained and is signed in the bedside chart  Anesthesia Fentanyl   Time Out Verified patient identification, verified procedure, site/side was marked, verified correct patient position, special equipment/implants available, medications/allergies/relevant history reviewed, required imaging and test results available.   Sterile Technique Usual hand hygiene, masks, gowns, and gloves were used   Procedure Description Bronchoscope advanced through endotracheal tube and into airway.  Airways were examined down to subsegmental level with findings noted below.   Following diagnostic evaluation, BAL(s) performed in superior segment of right lower lobe with instillation of 180cc with normal saline and return of 30 cc cloudy blood tinged fluid  Findings: Normal anatomy. Mild erythematous mucosa. Thin clear secretions through easily suctioned and removed. BAL performed as noted above of superior segment of right lower lobe.   Complications/Tolerance None; patient tolerated the procedure well. Chest X-ray is not needed post procedure.   EBL Minimal   Specimen(s) BAL for cell count, bacterial, fungal and AFB

## 2020-08-28 NOTE — Progress Notes (Signed)
Critical lab: Lactic acid 2.3. Linton Flemings, NP made aware.

## 2020-08-28 NOTE — Progress Notes (Addendum)
Called d/t pt sats in the 70's.  Pt found to be oriented somewhat drowsy but easily aroused.  Provider at bedside evaluating pt and placing multiple orders in chart. Orders received and initiated. Pt will transfer to ICU/SD for a higher level of care.  See chart for VS.

## 2020-08-29 ENCOUNTER — Inpatient Hospital Stay (HOSPITAL_COMMUNITY): Payer: Medicare HMO

## 2020-08-29 DIAGNOSIS — J189 Pneumonia, unspecified organism: Secondary | ICD-10-CM | POA: Diagnosis not present

## 2020-08-29 LAB — BLOOD GAS, ARTERIAL
Acid-base deficit: 7.2 mmol/L — ABNORMAL HIGH (ref 0.0–2.0)
Bicarbonate: 17.1 mmol/L — ABNORMAL LOW (ref 20.0–28.0)
Drawn by: 11249
FIO2: 100
MECHVT: 600 mL
O2 Saturation: 99.7 %
PEEP: 5 cmH2O
Patient temperature: 38.5
RATE: 24 resp/min
pCO2 arterial: 34.8 mmHg (ref 32.0–48.0)
pH, Arterial: 7.322 — ABNORMAL LOW (ref 7.350–7.450)
pO2, Arterial: 254 mmHg — ABNORMAL HIGH (ref 83.0–108.0)

## 2020-08-29 LAB — COMPREHENSIVE METABOLIC PANEL
ALT: 69 U/L — ABNORMAL HIGH (ref 0–44)
ALT: 214 U/L — ABNORMAL HIGH (ref 0–44)
AST: 98 U/L — ABNORMAL HIGH (ref 15–41)
AST: 251 U/L — ABNORMAL HIGH (ref 15–41)
Albumin: 2.3 g/dL — ABNORMAL LOW (ref 3.5–5.0)
Albumin: 2.3 g/dL — ABNORMAL LOW (ref 3.5–5.0)
Alkaline Phosphatase: 48 U/L (ref 38–126)
Alkaline Phosphatase: 55 U/L (ref 38–126)
Anion gap: 12 (ref 5–15)
Anion gap: 12 (ref 5–15)
BUN: 38 mg/dL — ABNORMAL HIGH (ref 6–20)
BUN: 37 mg/dL — ABNORMAL HIGH (ref 6–20)
CO2: 18 mmol/L — ABNORMAL LOW (ref 22–32)
CO2: 18 mmol/L — ABNORMAL LOW (ref 22–32)
Calcium: 7.4 mg/dL — ABNORMAL LOW (ref 8.9–10.3)
Calcium: 7.7 mg/dL — ABNORMAL LOW (ref 8.9–10.3)
Chloride: 99 mmol/L (ref 98–111)
Chloride: 100 mmol/L (ref 98–111)
Creatinine, Ser: 1.26 mg/dL — ABNORMAL HIGH (ref 0.61–1.24)
Creatinine, Ser: 1.08 mg/dL (ref 0.61–1.24)
GFR, Estimated: >60 mL/min (ref >60)
GFR, Estimated: >60 mL/min (ref >60)
Glucose, Bld: 79 mg/dL (ref 70–99)
Glucose, Bld: 97 mg/dL (ref 70–99)
Potassium: 3.8 mmol/L (ref 3.5–5.1)
Potassium: 3.7 mmol/L (ref 3.5–5.1)
Sodium: 129 mmol/L — ABNORMAL LOW (ref 135–145)
Sodium: 130 mmol/L — ABNORMAL LOW (ref 135–145)
Total Bilirubin: 1.1 mg/dL (ref 0.3–1.2)
Total Bilirubin: 1 mg/dL (ref 0.3–1.2)
Total Protein: 4.8 g/dL — ABNORMAL LOW (ref 6.5–8.1)
Total Protein: 4.9 g/dL — ABNORMAL LOW (ref 6.5–8.1)

## 2020-08-29 LAB — BODY FLUID CELL COUNT WITH DIFFERENTIAL
Lymphs, Fluid: 2 %
Monocyte-Macrophage-Serous Fluid: 13 % — ABNORMAL LOW (ref 50–90)
Neutrophil Count, Fluid: 85 % — ABNORMAL HIGH (ref 0–25)
Total Nucleated Cell Count, Fluid: 1180 cu mm — ABNORMAL HIGH (ref 0–1000)

## 2020-08-29 LAB — CBC WITH DIFFERENTIAL/PLATELET
Abs Immature Granulocytes: 0.16 10*3/uL — ABNORMAL HIGH (ref 0.00–0.07)
Basophils Absolute: 0.2 10*3/uL — ABNORMAL HIGH (ref 0.0–0.1)
Basophils Relative: 1 %
Eosinophils Absolute: 0 10*3/uL (ref 0.0–0.5)
Eosinophils Relative: 0 %
HCT: 37.2 % — ABNORMAL LOW (ref 39.0–52.0)
Hemoglobin: 12.7 g/dL — ABNORMAL LOW (ref 13.0–17.0)
Immature Granulocytes: 1 %
Lymphocytes Relative: 3 %
Lymphs Abs: 0.5 10*3/uL — ABNORMAL LOW (ref 0.7–4.0)
MCH: 32.1 pg (ref 26.0–34.0)
MCHC: 34.1 g/dL (ref 30.0–36.0)
MCV: 93.9 fL (ref 80.0–100.0)
Monocytes Absolute: 0.5 10*3/uL (ref 0.1–1.0)
Monocytes Relative: 2 %
Neutro Abs: 18.7 10*3/uL — ABNORMAL HIGH (ref 1.7–7.7)
Neutrophils Relative %: 93 %
Platelets: 211 10*3/uL (ref 150–400)
RBC: 3.96 MIL/uL — ABNORMAL LOW (ref 4.22–5.81)
RDW: 14.4 % (ref 11.5–15.5)
WBC: 20 10*3/uL — ABNORMAL HIGH (ref 4.0–10.5)
nRBC: 0 % (ref 0.0–0.2)

## 2020-08-29 LAB — LACTIC ACID, PLASMA
Lactic Acid, Venous: 2.9 mmol/L (ref 0.5–1.9)
Lactic Acid, Venous: 2.1 mmol/L (ref 0.5–1.9)

## 2020-08-29 LAB — TRIGLYCERIDES: Triglycerides: 200 mg/dL — ABNORMAL HIGH (ref <150)

## 2020-08-29 LAB — MRSA PCR SCREENING: MRSA by PCR: POSITIVE — AB

## 2020-08-29 LAB — STREP PNEUMONIAE URINARY ANTIGEN: Strep Pneumo Urinary Antigen: NEGATIVE

## 2020-08-29 LAB — MAGNESIUM: Magnesium: 1.8 mg/dL (ref 1.7–2.4)

## 2020-08-29 IMAGING — DX DG CHEST 1V PORT
1 series · 1 of 1 positions shown · non-contrast
Comparison: [DATE], CT [DATE]

CLINICAL DATA: Central line placement

EXAM:
PORTABLE CHEST 1 VIEW

[chest ap]
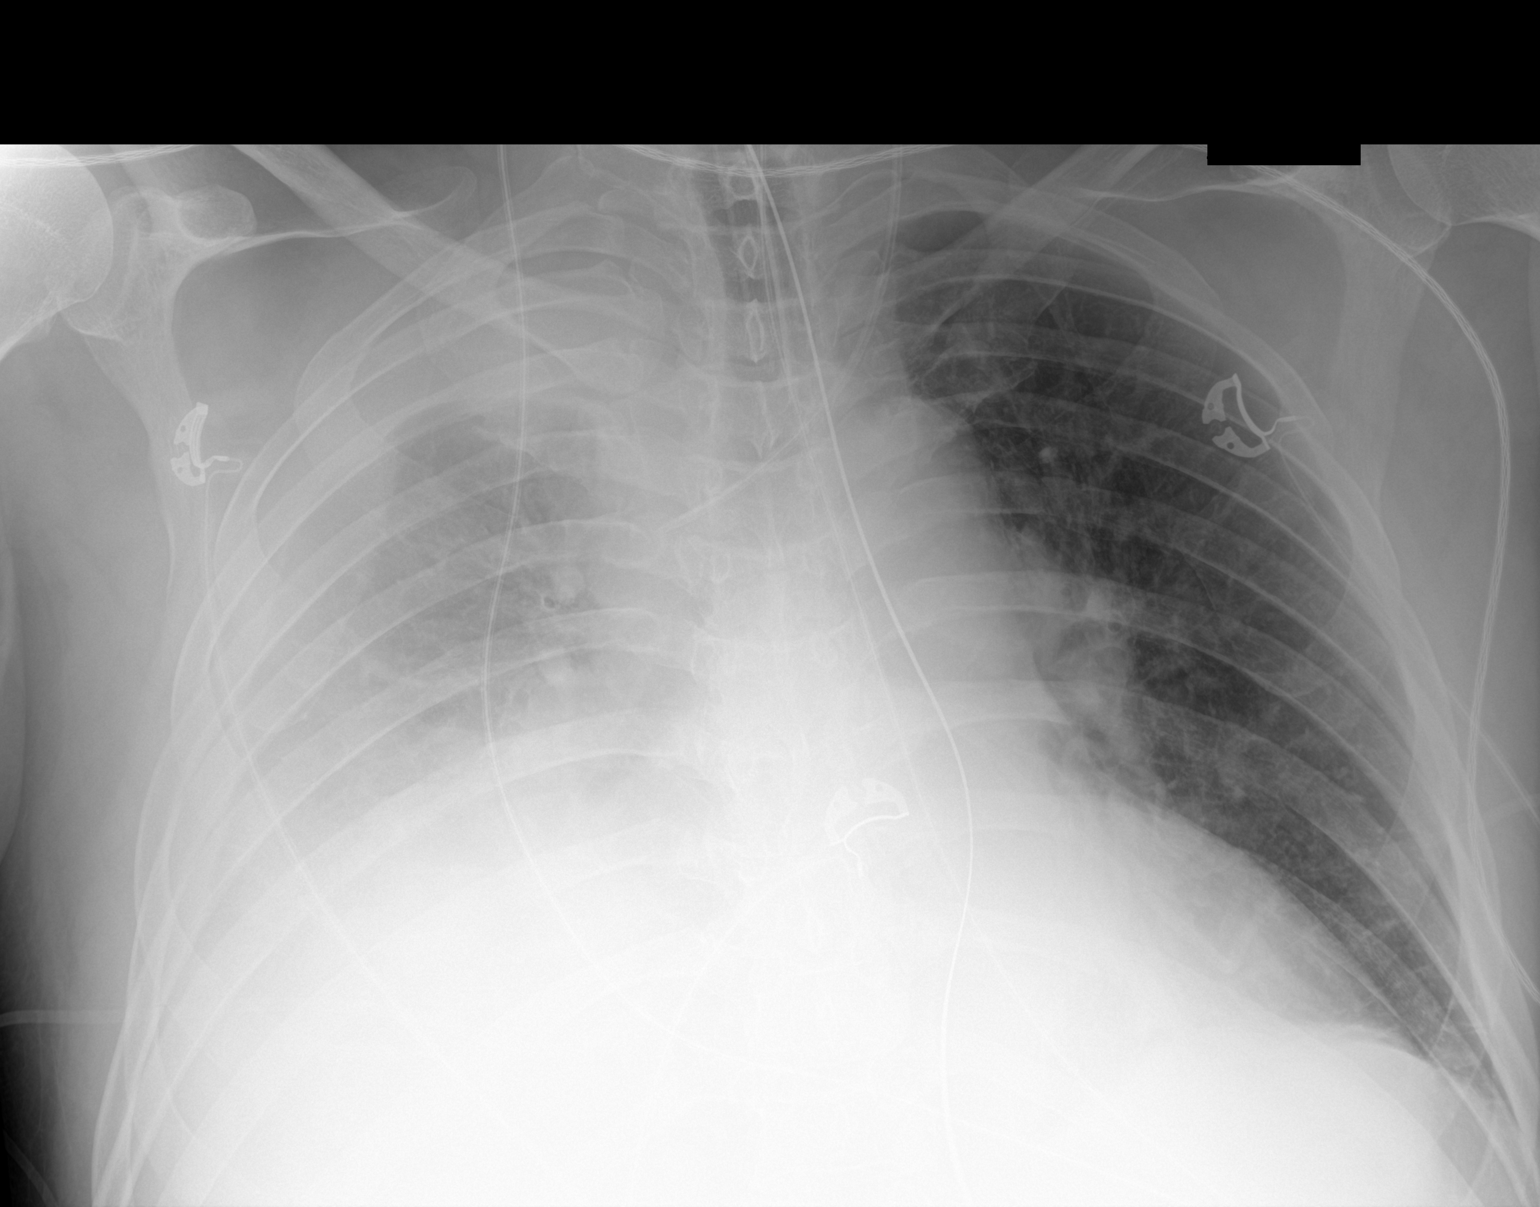

[1 of 1 positions shown; findings below may reference images not displayed]

FINDINGS: Endotracheal tube tip terminates 3 cm from the carina.
Transesophageal tube tip and side port distal to the GE junction.
Telemetry leads overlie the chest.

Interval placement a left IJ approach central venous catheter with
the tip positioned near the left brachiocephalic-superior caval
confluence.

Suspect continued increase in the size of a layering right pleural
effusion now with only minimal residually aerated lung in the right
hemithorax. Underlying consolidative opacity is partially visible
albeit with some superimposed atelectatic changes suspected as well.
Left lung remains predominantly clear. Portion of the
cardiomediastinal contours are now obscured by adjacent opacity
though appear grossly similar to prior portable radiography. No
acute osseous or soft tissue abnormality.
IMPRESSION: 1. Interval placement of left IJ approach central venous catheter
with the tip positioned near the left brachiocephalic-superior caval
confluence. No pneumothorax.
2. Suspect continued increase in size of layering right pleural
effusion now with only minimal residually aerated lung in the right
hemithorax. Underlying airspace disease likely remains with some
superimposed passive atelectatic changes.

## 2020-08-29 MED ORDER — ACETAMINOPHEN 650 MG RE SUPP: 650.0000 mg | Freq: Four times a day (QID) | RECTAL | Status: DC | PRN

## 2020-08-29 MED ORDER — PHENYLEPHRINE HCL-NACL 10-0.9 MG/250ML-% IV SOLN
0.0000 ug/min | INTRAVENOUS | Status: DC
  Administered 2020-08-29: 155 ug/min via INTRAVENOUS
  Administered 2020-08-29: 315 ug/min via INTRAVENOUS
  Administered 2020-08-29: 210 ug/min via INTRAVENOUS
  Filled 2020-08-29 (×3): qty 250

## 2020-08-29 MED ORDER — PANTOPRAZOLE SODIUM 40 MG IV SOLR
40.0000 mg | INTRAVENOUS | Status: DC
  Administered 2020-08-29 – 2020-09-01 (×4): 40 mg via INTRAVENOUS
  Filled 2020-08-29 (×4): qty 40

## 2020-08-29 MED ORDER — MAGNESIUM SULFATE 2 GM/50ML IV SOLN
2.0000 g | Freq: Once | INTRAVENOUS | Status: AC
  Administered 2020-08-29: 2 g via INTRAVENOUS
  Filled 2020-08-29: qty 50

## 2020-08-29 MED ORDER — ACETAMINOPHEN 160 MG/5ML PO SOLN
650.0000 mg | Freq: Four times a day (QID) | ORAL | Status: DC | PRN
  Administered 2020-08-29 – 2020-09-11 (×16): 650 mg
  Filled 2020-08-29 (×16): qty 20.3

## 2020-08-29 MED ORDER — VASOPRESSIN 20 UNITS/100 ML INFUSION FOR SHOCK
0.0000 [IU]/min | INTRAVENOUS | Status: DC
  Administered 2020-08-29 (×2): 0.03 [IU]/min via INTRAVENOUS
  Administered 2020-08-29 – 2020-08-31 (×5): 0.04 [IU]/min via INTRAVENOUS
  Filled 2020-08-29 (×10): qty 100

## 2020-08-29 MED ORDER — METOPROLOL TARTRATE 5 MG/5ML IV SOLN
5.0000 mg | Freq: Once | INTRAVENOUS | Status: AC
  Administered 2020-08-29: 5 mg via INTRAVENOUS
  Filled 2020-08-29: qty 5

## 2020-08-29 MED ORDER — MUPIROCIN 2 % EX OINT
1.0000 "application " | TOPICAL_OINTMENT | Freq: Two times a day (BID) | CUTANEOUS | Status: AC
  Administered 2020-08-29 – 2020-09-02 (×10): 1 via NASAL
  Filled 2020-08-29: qty 22

## 2020-08-29 MED ORDER — LACTATED RINGERS IV BOLUS
1000.0000 mL | Freq: Once | INTRAVENOUS | Status: AC
  Administered 2020-08-29: 1000 mL via INTRAVENOUS

## 2020-08-29 MED ORDER — NOREPINEPHRINE 4 MG/250ML-% IV SOLN
INTRAVENOUS | Status: AC
  Administered 2020-08-29: 4 ug/min via INTRAVENOUS
  Filled 2020-08-29: qty 250

## 2020-08-29 MED ORDER — PHENYLEPHRINE CONCENTRATED 100MG/250ML (0.4 MG/ML) INFUSION SIMPLE
0.0000 ug/min | INTRAVENOUS | Status: DC
  Administered 2020-08-29: 135 ug/min via INTRAVENOUS
  Administered 2020-08-29: 90 ug/min via INTRAVENOUS
  Administered 2020-08-29: 70 ug/min via INTRAVENOUS
  Administered 2020-08-30: 90 ug/min via INTRAVENOUS
  Filled 2020-08-29 (×4): qty 250

## 2020-08-29 MED ORDER — METOPROLOL TARTRATE 12.5 MG HALF TABLET
12.5000 mg | ORAL_TABLET | Freq: Four times a day (QID) | ORAL | Status: DC
  Administered 2020-08-29 – 2020-08-30 (×3): 12.5 mg
  Filled 2020-08-29 (×3): qty 1

## 2020-08-29 MED ORDER — SODIUM CHLORIDE 0.9 % IV BOLUS
1000.0000 mL | Freq: Once | INTRAVENOUS | Status: AC
  Administered 2020-08-29: 1000 mL via INTRAVENOUS

## 2020-08-29 MED ORDER — METOPROLOL TARTRATE 12.5 MG HALF TABLET
12.5000 mg | ORAL_TABLET | Freq: Four times a day (QID) | ORAL | Status: DC
  Administered 2020-08-29: 12.5 mg via ORAL
  Filled 2020-08-29: qty 1

## 2020-08-29 MED ORDER — NOREPINEPHRINE 4 MG/250ML-% IV SOLN
0.0000 ug/min | INTRAVENOUS | Status: DC
  Administered 2020-08-29: 20 ug/min via INTRAVENOUS
  Administered 2020-08-29 (×2): 22 ug/min via INTRAVENOUS
  Administered 2020-08-30: 17 ug/min via INTRAVENOUS
  Administered 2020-08-30 (×2): 24 ug/min via INTRAVENOUS
  Administered 2020-08-30: 11 ug/min via INTRAVENOUS
  Administered 2020-08-30: 27 ug/min via INTRAVENOUS
  Administered 2020-08-31: 4 ug/min via INTRAVENOUS
  Administered 2020-08-31: 2 ug/min via INTRAVENOUS
  Filled 2020-08-29 (×10): qty 250

## 2020-08-29 MED FILL — Phenylephrine HCl IV Soln 10 MG/ML: INTRAVENOUS | Qty: 10 | Status: AC

## 2020-08-29 MED FILL — Sodium Chloride IV Soln 0.9%: INTRAVENOUS | Qty: 250 | Status: AC

## 2020-08-29 NOTE — Progress Notes (Signed)
eLink Physician-Brief Progress Note Patient Name: Joseph Ayala DOB: 17-Jul-1965 MRN: [id number]   Date of Service  08/29/2020  HPI/Events of Note  Hypotension - BP = 70/53 with MAP = 57. Phenylephrine IV infusion at 160 mcg/min via PIV.  eICU Interventions  Plan: 1. Add Vasopressin IV infusion at shock dose.  2. Bolus with 0.9 NaCl 1 liter IV over 1 hour now.  3. Will request PCCM ground team place CVL.     Intervention Category Major Interventions: Hypotension - evaluation and management  Salayah Meares Dennard Nip 08/29/2020, 12:41 AM

## 2020-08-29 NOTE — Progress Notes (Signed)
RT is holding CPT at this time due to patient having difficulty being sudated.

## 2020-08-29 NOTE — Procedures (Signed)
Central Venous Catheter Insertion Procedure Note  Joseph Ayala  [id number]  1966/01/16  Date:08/29/20  Time:1:53 AM   Provider Performing:Jackson Fetters Laney Pastor   Procedure: Insertion of Non-tunneled Central Venous [phone]) with US guidance ([id number])   Indication(s) Medication administration  Consent Risks of the procedure as well as the alternatives and risks of each were explained to the patient and/or caregiver.  Consent for the procedure was obtained and is signed in the bedside chart  Anesthesia Topical only with 1% lidocaine   Timeout Verified patient identification, verified procedure, site/side was marked, verified correct patient position, special equipment/implants available, medications/allergies/relevant history reviewed, required imaging and test results available.  Sterile Technique Maximal sterile technique including full sterile barrier drape, hand hygiene, sterile gown, sterile gloves, mask, hair covering, sterile ultrasound probe cover (if used).  Procedure Description Area of catheter insertion was cleaned with chlorhexidine and draped in sterile fashion.  With real-time ultrasound guidance a central venous catheter was placed into the left internal jugular vein. Nonpulsatile blood flow and easy flushing noted in all ports.  The catheter was sutured in place and sterile dressing applied.  Complications/Tolerance None; patient tolerated the procedure well. Chest X-ray is ordered to verify placement for internal jugular or subclavian cannulation.   Chest x-ray is not ordered for femoral cannulation.  EBL Minimal  Specimen(s) None  Marcelo Baldy, MD 08/29/20 1:54 AM

## 2020-08-29 NOTE — Progress Notes (Signed)
eLink Physician-Brief Progress Note Patient Name: Joseph Ayala Sunday DOB: 02/17/1966 MRN: [id number]   Date of Service  08/29/2020  HPI/Events of Note  Na+ = 129 and Triglycerides = 200.   eICU Interventions  Repeat BMP and Triglycerides draw from a different site than where to Propofol is infusing.      Intervention Category Major Interventions: Electrolyte abnormality - evaluation and management  Kalany Diekmann Eugene 08/29/2020, 4:53 AM

## 2020-08-29 NOTE — Procedures (Signed)
Arterial Catheter Insertion Procedure Note  Joseph Ayala  [id number]  [birth year]  Date:08/29/20  Time:12:12 AM    Provider Performing: Tyson Dense P    Procedure: Insertion of Arterial Line ([id number]) without US guidance  Indication(s) Blood pressure monitoring and/or need for frequent ABGs  Consent Unable to obtain consent due to emergent nature of procedure.  Anesthesia None   Time Out Verified patient identification, verified procedure, site/side was marked, verified correct patient position, special equipment/implants available, medications/allergies/relevant history reviewed, required imaging and test results available.   Sterile Technique Maximal sterile technique including full sterile barrier drape, hand hygiene, sterile gown, sterile gloves, mask, hair covering, sterile ultrasound probe cover (if used).   Procedure Description Area of catheter insertion was cleaned with chlorhexidine and draped in sterile fashion. Without real-time ultrasound guidance an arterial catheter was placed into the right radial artery.  Appropriate arterial tracings confirmed on monitor.     Complications/Tolerance None; patient tolerated the procedure well.

## 2020-08-29 NOTE — Progress Notes (Signed)
Initial Nutrition Assessment  INTERVENTION:   TF recommendations if remains intubated: -Initiate Vital 1.5 @ 20 ml/hr, advance by 10 ml every 4 hours to goal rate of 65 ml/hr via OGT -90 ml Prosource TF BID -Provides 2500 kcals, 149g protein and 1191 ml H2O   NUTRITION DIAGNOSIS:   Inadequate oral intake related to inability to eat as evidenced by NPO status.  GOAL:   Patient will meet greater than or equal to 90% of their needs  MONITOR:   Vent status,Labs,Weight trends,I & O's  REASON FOR ASSESSMENT:   Ventilator    ASSESSMENT:   55 year old male on Suboxone for necrotizing myopathy who presents with acute hypoxemic respiratory failure secondary to community-acquired pneumonia/aspiration. PCCM initially consulted for evaluation for bronchoscopy for possible mucous plugging. Transferred to ICU after worsening respiratory failure and mental status requiring intubation on 2/21.  2/19: admitted for CAP 2/20: intubated  Per chart review, pt with history of EtOH abuse (drinks 9-10 beers daily).   Patient is currently intubated on ventilator support MV: 16.8 L/min Temp (24hrs), Avg:100.7 F (38.2 C), Min:98.78 F (37.1 C), Max:102.2 F (39 C)  Propofol: 34.8 ml/hr -provides 918 fat kcals   Medications: Colace, Folic acid, Liquid MVI, Miralax,  Thiamine, Lactated ringers, Iv Mg sulfate, Levophed, Phenylephrine, vasopressin  Labs reviewed: Low Na  Diet Order:   Diet Order            Diet NPO time specified  Diet effective now                 EDUCATION NEEDS:   No education needs have been identified at this time  Skin:  Skin Assessment: Reviewed RN Assessment  Last BM:  2/20  Height:   Ht Readings from Last 1 Encounters:  08/28/20 5\' 11"  (1.803 m)    Weight:   Wt Readings from Last 1 Encounters:  08/28/20 96.8 kg   BMI:  Body mass index is 29.76 kg/m.  Estimated Nutritional Needs:   Kcal:  2586  Protein:  140-155g  Fluid:   2L/day  [id number], MS, RD, LDN Inpatient Clinical Dietitian Contact information available via Amion

## 2020-08-29 NOTE — TOC Initial Note (Signed)
Transition of Care [facility]) - Initial/Assessment Note    Patient Details  Name: Joseph Ayala MRN: [id number] Date of Birth: 10/25/1965  Transition of Care [facility]) CM/SW Contact:    Golda Acre, RN Phone Number: 08/29/2020, 8:17 AM  Clinical Narrative:                 55 y.o. male with medical history significant of hypertension, necrotizing myopathy, low testosterone, opioid use disorder on Suboxone presented to the ED with complaints of right-sided chest pain, cough, and shortness of breath.  Patient reports 1 week history of productive cough, dyspnea, and sharp substernal chest pain.  Reports having chills.  He is fully vaccinated against COVID including booster shot.  Denies nausea, vomiting, abdominal pain, or diarrhea.  States he has not used oxycodone for several years and takes Suboxone but ran out several days ago.  He was moaning and no additional history could be obtained.  ED Course: Afebrile.  Tachycardic and tachypneic.  Not hypotensive.  Oxygen saturation 88-91% on room air, placed on 2 L supplemental oxygen.  WBC 11.9, hemoglobin 15.3, platelet count 166K.  Sodium 134, potassium 3.8, chloride 97, bicarb 29, BUN 20, creatinine 0.6, glucose 125.  High-sensitivity troponin negative.  EKG not suggestive of ACS.  SARS-CoV-2 PCR test negative.  Chest x-ray showing right lower lobe consolidation/bronchopneumonia.  CT angiogram chest negative for PE.  Showing dense regions of mixed consolidative and groundglass opacity with septal thickening in the right middle and lower lobe.  Also showing ascending thoracic aortic aneurysm, coronary artery calcifications, and aortic atherosclerosis. Medications administered include albuterol inhaler treatment, fentanyl, morphine, prednisone, ceftriaxone, and azithromycin. Plan: unable to determine at this time due to on vent and condition  Expected Discharge Plan: Home/Self Care Barriers to Discharge: Continued Medical Work up   Patient Goals and  CMS Choice Patient states their goals for this hospitalization and ongoing recovery are:: on vent unable to state CMS Medicare.gov Compare Post Acute Care list provided to:: Patient    Expected Discharge Plan and Services Expected Discharge Plan: Home/Self Care   Discharge Planning Services: CM Consult   Living arrangements for the past 2 months: Single Family Home                                      Prior Living Arrangements/Services Living arrangements for the past 2 months: Single Family Home Lives with:: Spouse                   Activities of Daily Living Home Assistive Devices/Equipment: None ADL Screening (condition at time of admission) Patient's cognitive ability adequate to safely complete daily activities?: Yes Is the patient deaf or have difficulty hearing?: No Does the patient have difficulty seeing, even when wearing glasses/contacts?: No Does the patient have difficulty concentrating, remembering, or making decisions?: No Patient able to express need for assistance with ADLs?: Yes Does the patient have difficulty dressing or bathing?: No Independently performs ADLs?: Yes (appropriate for developmental age) Does the patient have difficulty walking or climbing stairs?: No Weakness of Legs: None Weakness of Arms/Hands: None  Permission Sought/Granted                  Emotional Assessment Appearance:: Appears stated age Attitude/Demeanor/Rapport: Unable to Assess Affect (typically observed): Unable to Assess Orientation: : Fluctuating Orientation (Suspected and/or reported Sundowners) Alcohol / Substance Use: Illicit Drugs,Tobacco Use Psych Involvement:  No (comment)  Admission diagnosis:  Hypoxia [R09.02] Acute respiratory failure with hypoxia (HCC) [J96.01] Thoracic aortic aneurysm without rupture (HCC) [I71.2] Chest pain [R07.9] Community acquired pneumonia, unspecified laterality [J18.9] CAP (community acquired pneumonia)  [J18.9] Patient Active Problem List   Diagnosis Date Noted  . CAP (community acquired pneumonia) 08/28/2020  . Acute hypoxemic respiratory failure (HCC) 08/28/2020  . Sepsis (HCC) 08/28/2020  . Chest pain 08/28/2020  . Healthcare maintenance 06/20/2020  . At Risk Alcohol Use 04/11/2020  . Hypertension 07/13/2019  . ADHD 06/29/2019  . Low testosterone 06/01/2019  . Opioid use disorder 09/11/2018  . Necrotizing myopathy 12/27/2017  . Chronic pain syndrome 10/22/2017  . Degeneration of lumbar intervertebral disc 09/25/2017  . Lumbar spondylosis 09/25/2017   PCP:  [name], MD Pharmacy:   [facility] [address], [place] - [address] [facility] [address] [place] [address] Phone: [phone] Fax: [phone]     Social Determinants of Health (SDOH) Interventions    Readmission Risk Interventions No flowsheet data found.

## 2020-08-29 NOTE — Progress Notes (Signed)
Patient experienced agitation episode. Fentanyl bolus administered, and patient quickly settled down. Patient's HR noted to be up to 150's. EKG obtained reading a-fib w/ RVR. After EKG, patient resumed agitation despite 50 mcg fentanyl bolus. Another 50 mcg bolus administered and propofol turned back on to achieve RASS of -3 and prevent thrashing in bed. MD Everardo All notified. Currently awaiting orders for scheduled metoprolol.

## 2020-08-29 NOTE — Progress Notes (Signed)
RT attempted CPT on patient. Patient immediatly woke up and was extremely irritated. RN called the box to let CCM know that he could not tolerate.

## 2020-08-29 NOTE — Progress Notes (Signed)
Chart reviewed.  Patient intubated overnight.  PCCM took over as primary attending.  Hospitalist will sign off.

## 2020-08-29 NOTE — Consult Note (Signed)
NAME:  Joseph Ayala, MRN:  [id number], DOB:  11/26/1965, LOS: 1 ADMISSION DATE:  08/27/2020, CONSULTATION DATE:  08/28/20 REFERRING MD:  Darliss Cheney, MD CHIEF COMPLAINT:  Acute hypoxemic respiratory failure  Brief History:  55 year old male on Suboxone for necrotizing myopathy who presents with acute hypoxemic respiratory failure secondary to community-acquired pneumonia/aspiration. PCCM initially consulted for evaluation for bronchoscopy for possible mucous plugging. Transferred to ICU after worsening respiratory failure and mental status requiring intubation on 2/21.  History of Present Illness:  Joseph Ayala is a 55 year old male with history of necrotizing myopathy, prior history of opioid use disorder on Suboxone, hypertension who presented to the ED with cough, shortness of breath and chest pain.  He reports a 1 week history of productive cough, shortness of breath and associated substernal chest pain.  He is fully vaccinated against Covid including booster shot.  Admission Covid test negative  In the ED, he is afebrile but tachycardic and tachypneic.  O2 saturations were noted to be 88% on room air.  Admission chest x-ray with mild right lower lobe consolidation noted.  CTA obtained which was negative for PE however did show dense consolidation and groundglass opacities in the right middle and lower lobe as well as mild centrilobular emphysema.  Labs were significant for lactic acid 2.3 and BNP 114.  Otherwise blood counts and BMP overall unremarkable.  Troponin negative x2.  He was admitted to hospitalist service for acute hypoxemic respiratory failure/sepsis secondary to community-acquired pneumonia and started on antibiotics and 2 L oxygen.  Prior to shift change this morning he was found with O2 saturations in the 70s.  He was drowsy, tachypneic.  Rapid response was called and he was placed on nonrebreather.  Bedside CBG noted hypoglycemia to the 60s which improved with dextrose to 123.   EKG ordered which showed sinus tach.  Transfer to stepdown unit and pulmonary consulted to evaluate for possible mucous plug requiring bronchoscopy.  In the SDU he is drowsy but more alert on NRB. Now that is more interactive, he reports that his shortness of breath has improved compared to earlier this morning. Now is having severe abdominal and back pain that is new as well as feeling like his belly is tight  Past Medical History:  Hypertension, necrotizing myopathy, opioid use on Suboxone  Significant Hospital Events:  2/19 Arrived to ED for cough and chest pain x 1 week 2/20 Admitted to [facility] in early am  Consults:  TR[facility] PCCM  Procedures:    Significant Diagnostic Tests:  CTA - right sided consolidation and ground glass opacities in the right and middle lobe. Background centrilobular emphysema present. No pulmonary embolism  Micro Data:  Blood culture 2/19> BAL resp cx 2/20> BAL fungal cx 2/20> BAL AFB cx 2/20> Antimicrobials:  Ceftriaxone 2/20>2/21 Azithro 2/20>2/21 Vanc 2/21> Zosyn 2/21>  Interim History / Subjective:  Yesterday, his mental status worsened as well as more diaphoretic and toxic appearing. Due to his risk of aspiration and worsening pneumonia on CT, he was intubated for airway protection. Was started on vasopressors overnight for hypotension and required central line placement.  This morning on mechanical ventilation with good O2 saturations. On neo 70 mcg/min  Objective   Blood pressure (!) 72/57, pulse (!) 111, temperature (!) 101.66 F (38.7 C), resp. rate (!) 25, height _0  (1.803 m), weight 96.8 kg, SpO2 100 %. CVP:  [17 mmHg] 17 mmHg  Vent Mode: PRVC FiO2 (%):  [60 %-100 %] 60 % Set Rate:  [  18 bmp-24 bmp] 24 bmp Vt Set:  [600 mL] 600 mL PEEP:  [5 cmH20] 5 cmH20 Plateau Pressure:  [16 cmH20-21 cmH20] 20 cmH20   Intake/Output Summary (Last 24 hours) at 08/29/2020 0748 Last data filed at 08/29/2020 0700 Gross per 24 hour  Intake 3436.63 ml   Output 1925 ml  Net 1511.63 ml   Filed Weights   08/27/20 1808 08/28/20 0214  Weight: 95.3 kg 96.8 kg   Physical Exam: General: Critically ill-appearing, no acute distress HENT: [place], AT, ETT in place Eyes: EOMI, no scleral icterus Respiratory: Minimal rhonchi otherwise clear breath sounds bilaterally.  No crackles, wheezing or rales Cardiovascular: RRR, -M/R/G, no JVD GI: BS+, soft, nontender Extremities:-Edema,-tenderness Neuro: Drowsy, opens eye to tactile and noxious stimuli, moves extremities x 4  Resolved Hospital Problem list     Assessment & Plan:  Acute hypoxemic respiratory failure secondary to community acquired pneumonia Aspiration pneumonia/pneumonitis in setting of altered mental status Community-acquired pneumonia Emphysema Rapid respiratory failure is likely due community acquired pneumonia exacerbated by aspiration pneumonitis escalating to dense consolidation with ARDS-like appearance limited to only right lung involvement.  --Full vent support. Wean FIO2 and PEEP for goal SpO2 88-95% --Antibiotics broadened to Vanc and Zosyn --Follow-up cultures including BAL cultures 2/20> --F/u urine legionella and strep --VAP  Septic shock secondary to pneumonia Hypotension secondary to sedation --Continue broad spectrum antibiotics --De-escalate pending culture data --Wean Neo for MAP goal >65 --LR bolus now --Trend LR --Echo  AKI secondary to pre-renal - UOP good Mild metabolic acidosis --Trend UOP/Cr --Avoid nephrotoxic agents  EtOH Abuse Last drink possibly yesterday vs a few days ago per patient. He reports 9-10 beers daily. --Thiamine, folate, multivitamin  Necrotizing myopathy requiring suboxone Recently ran out of suboxone. Has taken OTC tylenol and ibuprofen for pain control --Hold suboxone while inpatient   Best practice (evaluated daily)  Diet: NPO Pain/Anxiety/Delirium protocol (if indicated): N/A VAP protocol (if indicated): N/A DVT  prophylaxis: Lovenox GI prophylaxis: Per primary Glucose control: Per primary Mobility: As tolerated Disposition: SDU  Goals of Care:  Last date of multidisciplinary goals of care discussion:  Family and staff present:  Summary of discussion:  Follow up goals of care discussion due: 2/28 Code Status: Full code. Confirmed on 2/20  Labs   CBC: Recent Labs  Lab 08/27/20 1953 08/28/20 0348 08/28/20 1341 08/29/20 0300  WBC 11.9* 7.4 8.0 20.0*  NEUTROABS  --   --   --  18.7*  HGB 15.3 14.7 14.6 12.7*  HCT 43.9 43.5 42.5 37.2*  MCV 91.3 93.5 91.8 93.9  PLT 166 152 188 829    Basic Metabolic Panel: Recent Labs  Lab 08/27/20 1953 08/29/20 0300  NA 134* 129*  K 3.8 3.8  CL 97* 99  CO2 29 18*  GLUCOSE 125* 79  BUN 20 38*  CREATININE 0.63 1.26*  CALCIUM 9.2 7.4*  MG  --  1.8   GFR: Estimated Creatinine Clearance: 80.5 mL/min (A) (by C-G formula based on SCr of 1.26 mg/dL (H)). Recent Labs  Lab 08/27/20 1953 08/28/20 0348 08/28/20 0348 08/28/20 1035 08/28/20 1341 08/28/20 1645 08/28/20 1932 08/29/20 0030 08/29/20 0300  WBC 11.9* 7.4  --   --  8.0  --   --   --  20.0*  LATICACIDVEN  --  2.3*   < > 2.6*  --  4.3* 4.7* 2.9*  --    < > = values in this interval not displayed.    Liver Function Tests: Recent Labs  Lab 08/28/20 1309 08/29/20 0300  AST 46* 98*  ALT 54* 69*  ALKPHOS 47 48  BILITOT 1.2 1.1  PROT 5.8* 4.8*  ALBUMIN 3.0* 2.3*   Recent Labs  Lab 08/28/20 1309  LIPASE 20  AMYLASE 18*   No results for input(s): AMMONIA in the last 168 hours.  ABG    Component Value Date/Time   PHART 7.322 (L) 08/28/2020 2300   PCO2ART 34.8 08/28/2020 2300   PO2ART 254 (H) 08/28/2020 2300   HCO3 17.1 (L) 08/28/2020 2300   ACIDBASEDEF 7.2 (H) 08/28/2020 2300   O2SAT 99.7 08/28/2020 2300     Coagulation Profile: Recent Labs  Lab 08/28/20 0418  INR 1.2    Cardiac Enzymes: No results for input(s): CKTOTAL, CKMB, CKMBINDEX, TROPONINI in the last 168  hours.  HbA1C: No results found for: HGBA1C  CBG: Recent Labs  Lab 08/28/20 0631 08/28/20 0645 08/28/20 1544  GLUCAP 67* 123* 84    The patient is critically ill with multiple organ systems failure and requires high complexity decision making for assessment and support, frequent evaluation and titration of therapies, application of advanced monitoring technologies and extensive interpretation of multiple databases.  Independent Critical Care Time: 36 Minutes.   Rodman Pickle, M.D. [facility] Pulmonary/Critical Care Medicine 08/29/2020 7:49 AM   Please see Amion for pager number to reach on-call Pulmonary and Critical Care Team.

## 2020-08-29 NOTE — Progress Notes (Signed)
While restarting neo, went to change out bag since rate was so quick at 105 cc/hr. Noticed that this was quad-strength neo. Dr. Everardo All notified as pressor requirements were much higher than suspected. Received order for levophed. Levophed currently running. Neo re-scanned so that rate is appropriate with intended dosage.

## 2020-08-30 ENCOUNTER — Inpatient Hospital Stay (HOSPITAL_COMMUNITY): Payer: Medicare HMO

## 2020-08-30 ENCOUNTER — Telehealth: Payer: Self-pay

## 2020-08-30 ENCOUNTER — Other Ambulatory Visit: Payer: Self-pay

## 2020-08-30 DIAGNOSIS — I361 Nonrheumatic tricuspid (valve) insufficiency: Secondary | ICD-10-CM | POA: Diagnosis not present

## 2020-08-30 DIAGNOSIS — I340 Nonrheumatic mitral (valve) insufficiency: Secondary | ICD-10-CM

## 2020-08-30 DIAGNOSIS — I9589 Other hypotension: Secondary | ICD-10-CM | POA: Diagnosis not present

## 2020-08-30 DIAGNOSIS — J189 Pneumonia, unspecified organism: Secondary | ICD-10-CM | POA: Diagnosis not present

## 2020-08-30 DIAGNOSIS — M7989 Other specified soft tissue disorders: Secondary | ICD-10-CM

## 2020-08-30 LAB — LEGIONELLA PNEUMOPHILA SEROGP 1 UR AG: L. pneumophila Serogp 1 Ur Ag: NEGATIVE

## 2020-08-30 LAB — CBC
HCT: 30.4 % — ABNORMAL LOW (ref 39.0–52.0)
HCT: 28.4 % — ABNORMAL LOW (ref 39.0–52.0)
Hemoglobin: 10 g/dL — ABNORMAL LOW (ref 13.0–17.0)
Hemoglobin: 9.6 g/dL — ABNORMAL LOW (ref 13.0–17.0)
MCH: 31.6 pg (ref 26.0–34.0)
MCH: 31.8 pg (ref 26.0–34.0)
MCHC: 32.9 g/dL (ref 30.0–36.0)
MCHC: 33.8 g/dL (ref 30.0–36.0)
MCV: 96.2 fL (ref 80.0–100.0)
MCV: 94 fL (ref 80.0–100.0)
Platelets: 148 10*3/uL — ABNORMAL LOW (ref 150–400)
Platelets: 142 10*3/uL — ABNORMAL LOW (ref 150–400)
RBC: 3.16 MIL/uL — ABNORMAL LOW (ref 4.22–5.81)
RBC: 3.02 MIL/uL — ABNORMAL LOW (ref 4.22–5.81)
RDW: 14.6 % (ref 11.5–15.5)
RDW: 15 % (ref 11.5–15.5)
WBC: 16.7 10*3/uL — ABNORMAL HIGH (ref 4.0–10.5)
WBC: 15.2 10*3/uL — ABNORMAL HIGH (ref 4.0–10.5)
nRBC: 0.2 % (ref 0.0–0.2)
nRBC: 0.3 % — ABNORMAL HIGH (ref 0.0–0.2)

## 2020-08-30 LAB — BASIC METABOLIC PANEL
Anion gap: 8 (ref 5–15)
Anion gap: 11 (ref 5–15)
BUN: 29 mg/dL — ABNORMAL HIGH (ref 6–20)
BUN: 28 mg/dL — ABNORMAL HIGH (ref 6–20)
CO2: 20 mmol/L — ABNORMAL LOW (ref 22–32)
CO2: 19 mmol/L — ABNORMAL LOW (ref 22–32)
Calcium: 7.3 mg/dL — ABNORMAL LOW (ref 8.9–10.3)
Calcium: 7.9 mg/dL — ABNORMAL LOW (ref 8.9–10.3)
Chloride: 104 mmol/L (ref 98–111)
Chloride: 100 mmol/L (ref 98–111)
Creatinine, Ser: 0.67 mg/dL (ref 0.61–1.24)
Creatinine, Ser: 0.74 mg/dL (ref 0.61–1.24)
GFR, Estimated: >60 mL/min (ref >60)
GFR, Estimated: >60 mL/min (ref >60)
Glucose, Bld: 151 mg/dL — ABNORMAL HIGH (ref 70–99)
Glucose, Bld: 164 mg/dL — ABNORMAL HIGH (ref 70–99)
Potassium: 3.1 mmol/L — ABNORMAL LOW (ref 3.5–5.1)
Potassium: 3.8 mmol/L (ref 3.5–5.1)
Sodium: 132 mmol/L — ABNORMAL LOW (ref 135–145)
Sodium: 130 mmol/L — ABNORMAL LOW (ref 135–145)

## 2020-08-30 LAB — ECHOCARDIOGRAM COMPLETE
Area-P 1/2: 2.26 cm2
Calc EF: 35 %
Height: 71 in
MV M vel: 4 m/s
MV Peak grad: 64 mmHg
S' Lateral: 4.8 cm
Single Plane A2C EF: 42.9 %
Single Plane A4C EF: 28.9 %
Weight: 3414.48 oz

## 2020-08-30 LAB — HEPATIC FUNCTION PANEL
ALT: 195 U/L — ABNORMAL HIGH (ref 0–44)
AST: 151 U/L — ABNORMAL HIGH (ref 15–41)
Albumin: 2.3 g/dL — ABNORMAL LOW (ref 3.5–5.0)
Alkaline Phosphatase: 83 U/L (ref 38–126)
Bilirubin, Direct: 0.2 mg/dL (ref 0.0–0.2)
Indirect Bilirubin: 0.5 mg/dL (ref 0.3–0.9)
Total Bilirubin: 0.7 mg/dL (ref 0.3–1.2)
Total Protein: 5.3 g/dL — ABNORMAL LOW (ref 6.5–8.1)

## 2020-08-30 LAB — GLUCOSE, CAPILLARY
Glucose-Capillary: 130 mg/dL — ABNORMAL HIGH (ref 70–99)
Glucose-Capillary: 112 mg/dL — ABNORMAL HIGH (ref 70–99)
Glucose-Capillary: 88 mg/dL (ref 70–99)

## 2020-08-30 LAB — PHOSPHORUS
Phosphorus: 1.7 mg/dL — ABNORMAL LOW (ref 2.5–4.6)
Phosphorus: 1.6 mg/dL — ABNORMAL LOW (ref 2.5–4.6)

## 2020-08-30 LAB — CYTOLOGY - NON PAP

## 2020-08-30 LAB — TROPONIN I (HIGH SENSITIVITY): Troponin I (High Sensitivity): 391 ng/L (ref <18)

## 2020-08-30 LAB — LACTIC ACID, PLASMA
Lactic Acid, Venous: 2 mmol/L (ref 0.5–1.9)
Lactic Acid, Venous: 2.4 mmol/L (ref 0.5–1.9)

## 2020-08-30 IMAGING — DX DG CHEST 1V PORT
2 series · 2 of 2 positions shown · non-contrast
Comparison: [DATE]

CLINICAL DATA: Hypoxia

EXAM:
PORTABLE CHEST 1 VIEW

[chest ap (1 of 2)]
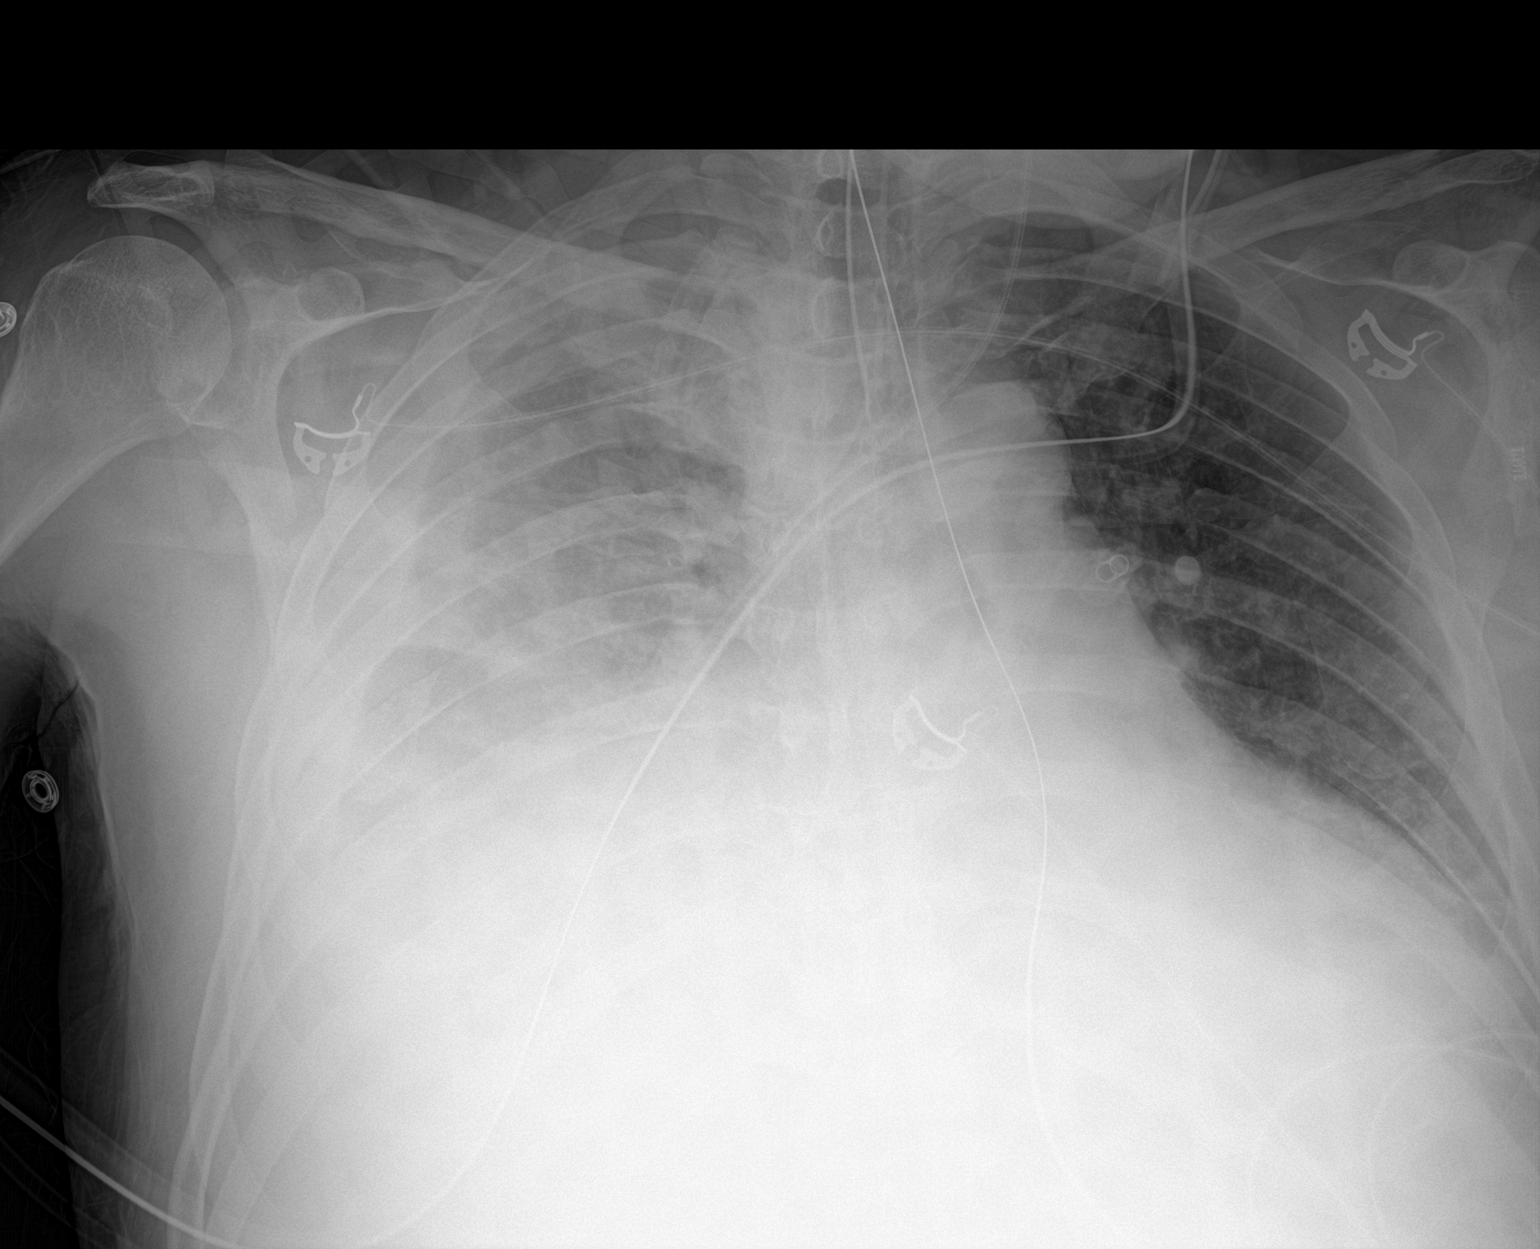

[chest ap (2 of 2)]
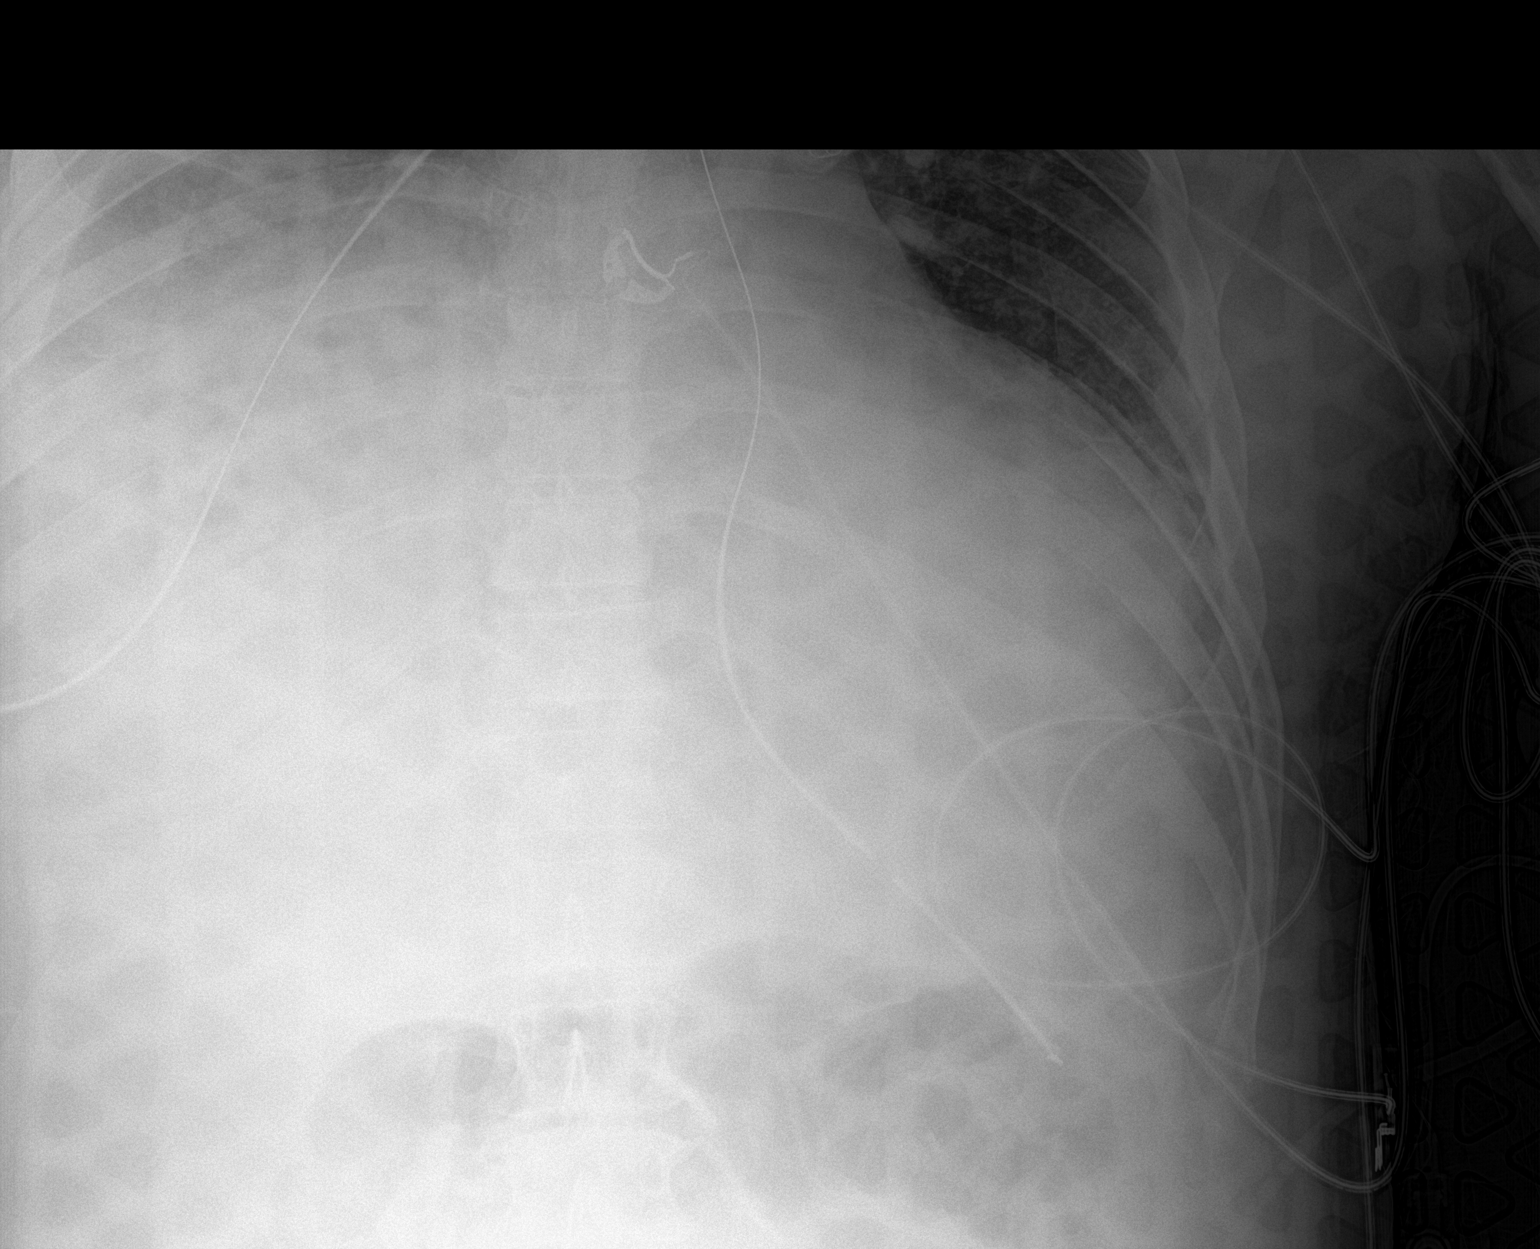

[2 of 2 positions shown; findings below may reference images not displayed]

FINDINGS: Endotracheal tube tip is 4.3 cm above the carina. Nasogastric tube
tip and side port are in the stomach. Central catheter tip is in the
superior vena cava. No pneumothorax. There is pleural effusion on
the right with suspected underlying foci of atelectasis and airspace
consolidation. There is focal atelectasis in the left base. Heart is
mildly enlarged with pulmonary vascularity normal. No adenopathy. No
bone lesions.
IMPRESSION: Tube and catheter positions as described without evident
pneumothorax. Layering pleural effusion on the right with
atelectasis and potential underlying airspace opacity/infiltrates in
the right lung. There is left base atelectasis. Left lung otherwise
clear. Stable cardiac prominence.

## 2020-08-30 MED ORDER — AMIODARONE HCL IN DEXTROSE 360-4.14 MG/200ML-% IV SOLN
INTRAVENOUS | Status: AC
  Filled 2020-08-30: qty 200

## 2020-08-30 MED ORDER — AMIODARONE HCL IN DEXTROSE 360-4.14 MG/200ML-% IV SOLN
60.0000 mg/h | INTRAVENOUS | Status: AC
  Administered 2020-08-30 (×2): 60 mg/h via INTRAVENOUS
  Filled 2020-08-30: qty 200

## 2020-08-30 MED ORDER — AMIODARONE IV BOLUS ONLY 150 MG/100ML
150.0000 mg | Freq: Once | INTRAVENOUS | Status: AC
  Administered 2020-08-30: 150 mg via INTRAVENOUS

## 2020-08-30 MED ORDER — SODIUM CHLORIDE 0.9 % IV SOLN
0.5000 mg/h | INTRAVENOUS | Status: DC
  Administered 2020-08-30: 0.5 mg/h via INTRAVENOUS
  Administered 2020-08-31 – 2020-09-01 (×2): 2.5 mg/h via INTRAVENOUS
  Administered 2020-09-01 – 2020-09-02 (×2): 3 mg/h via INTRAVENOUS
  Administered 2020-09-03: 05:00:00 4 mg/h via INTRAVENOUS
  Administered 2020-09-03 – 2020-09-04 (×2): 3 mg/h via INTRAVENOUS
  Administered 2020-09-05 (×2): 4 mg/h via INTRAVENOUS
  Administered 2020-09-06: 8 mg/h via INTRAVENOUS
  Administered 2020-09-06: 3 mg/h via INTRAVENOUS
  Administered 2020-09-07: 6 mg/h via INTRAVENOUS
  Administered 2020-09-07: 5.5 mg/h via INTRAVENOUS
  Administered 2020-09-07: 3 mg/h via INTRAVENOUS
  Administered 2020-09-08: 5 mg/h via INTRAVENOUS
  Administered 2020-09-08: 4.5 mg/h via INTRAVENOUS
  Administered 2020-09-09: 3.5 mg/h via INTRAVENOUS
  Administered 2020-09-09 (×2): 5 mg/h via INTRAVENOUS
  Administered 2020-09-10: 4 mg/h via INTRAVENOUS
  Administered 2020-09-11: 6 mg/h via INTRAVENOUS
  Administered 2020-09-11: 4 mg/h via INTRAVENOUS
  Administered 2020-09-11: 6 mg/h via INTRAVENOUS
  Administered 2020-09-12: 3 mg/h via INTRAVENOUS
  Administered 2020-09-12: 6 mg/h via INTRAVENOUS
  Administered 2020-09-13: 3 mg/h via INTRAVENOUS
  Administered 2020-09-13: 5 mg/h via INTRAVENOUS
  Administered 2020-09-14 (×2): 3 mg/h via INTRAVENOUS
  Administered 2020-09-16: 2 mg/h via INTRAVENOUS
  Filled 2020-08-30 (×44): qty 5

## 2020-08-30 MED ORDER — VANCOMYCIN HCL 1750 MG/350ML IV SOLN
1750.0000 mg | Freq: Two times a day (BID) | INTRAVENOUS | Status: DC
  Administered 2020-08-30 – 2020-09-01 (×5): 1750 mg via INTRAVENOUS
  Filled 2020-08-30 (×5): qty 350

## 2020-08-30 MED ORDER — HYDROMORPHONE BOLUS VIA INFUSION
0.5000 mg | INTRAVENOUS | Status: DC | PRN
  Administered 2020-08-30 – 2020-09-10 (×43): 0.5 mg via INTRAVENOUS
  Filled 2020-08-30: qty 1

## 2020-08-30 MED ORDER — VITAL HIGH PROTEIN PO LIQD: 1000.0000 mL | ORAL | Status: DC

## 2020-08-30 MED ORDER — MAGNESIUM SULFATE 2 GM/50ML IV SOLN
2.0000 g | Freq: Once | INTRAVENOUS | Status: AC
  Administered 2020-08-30: 2 g via INTRAVENOUS
  Filled 2020-08-30: qty 50

## 2020-08-30 MED ORDER — IBUPROFEN 100 MG/5ML PO SUSP
400.0000 mg | Freq: Four times a day (QID) | ORAL | Status: DC | PRN
  Administered 2020-08-30 – 2020-08-31 (×2): 400 mg via ORAL
  Filled 2020-08-30 (×4): qty 20

## 2020-08-30 MED ORDER — AMIODARONE HCL IN DEXTROSE 360-4.14 MG/200ML-% IV SOLN
30.0000 mg/h | INTRAVENOUS | Status: DC
  Administered 2020-08-30 – 2020-09-08 (×18): 30 mg/h via INTRAVENOUS
  Filled 2020-08-30 (×18): qty 200

## 2020-08-30 MED ORDER — POTASSIUM CHLORIDE 20 MEQ PO PACK
20.0000 meq | PACK | ORAL | Status: AC
  Administered 2020-08-30 (×2): 20 meq
  Filled 2020-08-30 (×2): qty 1

## 2020-08-30 MED ORDER — ADENOSINE 6 MG/2ML IV SOLN
INTRAVENOUS | Status: AC
  Filled 2020-08-30: qty 4

## 2020-08-30 MED ORDER — ENOXAPARIN SODIUM 100 MG/ML ~~LOC~~ SOLN
100.0000 mg | Freq: Two times a day (BID) | SUBCUTANEOUS | Status: DC
  Administered 2020-08-30 – 2020-08-31 (×3): 100 mg via SUBCUTANEOUS
  Filled 2020-08-30 (×4): qty 1

## 2020-08-30 MED ORDER — POTASSIUM PHOSPHATES 15 MMOLE/5ML IV SOLN
30.0000 mmol | Freq: Once | INTRAVENOUS | Status: AC
  Administered 2020-08-30: 30 mmol via INTRAVENOUS
  Filled 2020-08-30: qty 10

## 2020-08-30 MED ORDER — HYDROMORPHONE HCL 1 MG/ML IJ SOLN
1.0000 mg | Freq: Once | INTRAMUSCULAR | Status: AC
  Administered 2020-08-30: 1 mg via INTRAVENOUS

## 2020-08-30 MED ORDER — POLYETHYLENE GLYCOL 3350 17 G PO PACK
17.0000 g | PACK | Freq: Every day | ORAL | Status: DC
  Administered 2020-08-30 – 2020-09-11 (×6): 17 g
  Filled 2020-08-30 (×9): qty 1

## 2020-08-30 MED ORDER — PIVOT 1.5 CAL PO LIQD
1000.0000 mL | ORAL | Status: DC
  Administered 2020-08-30 – 2020-09-05 (×6): 1000 mL
  Filled 2020-08-30 (×8): qty 1000

## 2020-08-30 MED ORDER — LACTATED RINGERS IV BOLUS
1000.0000 mL | Freq: Once | INTRAVENOUS | Status: AC
  Administered 2020-08-30: 1000 mL via INTRAVENOUS

## 2020-08-30 MED ORDER — POTASSIUM CHLORIDE 10 MEQ/50ML IV SOLN
10.0000 meq | INTRAVENOUS | Status: AC
  Administered 2020-08-30 (×4): 10 meq via INTRAVENOUS
  Filled 2020-08-30 (×4): qty 50

## 2020-08-30 MED ORDER — CALCIUM GLUCONATE-NACL 1-0.675 GM/50ML-% IV SOLN
1.0000 g | Freq: Once | INTRAVENOUS | Status: AC
  Administered 2020-08-30: 1000 mg via INTRAVENOUS
  Filled 2020-08-30: qty 50

## 2020-08-30 MED ORDER — DOCUSATE SODIUM 50 MG/5ML PO LIQD
100.0000 mg | Freq: Two times a day (BID) | ORAL | Status: DC
  Administered 2020-08-30 – 2020-09-23 (×18): 100 mg
  Filled 2020-08-30 (×32): qty 10

## 2020-08-30 MED FILL — Phenylephrine HCl IV Soln 10 MG/ML: INTRAVENOUS | Qty: 10 | Status: AC

## 2020-08-30 MED FILL — Sodium Chloride IV Soln 0.9%: INTRAVENOUS | Qty: 250 | Status: AC

## 2020-08-30 NOTE — Progress Notes (Signed)
Nutrition Follow-up  DOCUMENTATION CODES:   Not applicable  INTERVENTION:  - will order Pivot 1.5 @ 20 ml/hr which, with kcal from current propofol rate, will provide 1332 kcal, 45 grams protein, and 364 ml free water. - if tolerating trickle rate without issue, consider, starting on 2/23, advancing Pivot 1.5 by 10 ml every 12 hours to reach goal rate of 70 ml/hr - at goal rate this regimen, not including kcal from propofol, will provide 2520 kcal, 157 grams protein, and 1275 ml free water.  Monitor magnesium, potassium, and phosphorus daily for at least 3 days, MD to replete as needed, as pt is at risk for refeeding syndrome given hx of extensive alcohol abuse.   NUTRITION DIAGNOSIS:   Inadequate oral intake related to inability to eat as evidenced by NPO status. -ongoing  GOAL:   Patient will meet greater than or equal to 90% of their needs -to be met with TF regimen  MONITOR:   Vent status,Labs,Weight trends,I & O's  REASON FOR ASSESSMENT:   Consult Enteral/tube feeding initiation and management  ASSESSMENT:   55 year old male on Suboxone for necrotizing myopathy who presents with acute hypoxemic respiratory failure secondary to community-acquired pneumonia/aspiration. PCCM initially consulted for evaluation for bronchoscopy for possible mucous plugging. Transferred to ICU after worsening respiratory failure and mental status requiring intubation on 2/21.  Patient remains intubated with OGT in place to LIS; abdominal xray report on 2/20 states tip of tube is gastric.  Patient discussed in rounds this AM and MD stated ok to start trickle rate TF today.   Patient's wife at bedside at the time of RD visit. She reports that patient often consumed alcohol rather than food and was using alcohol to self-medicate. She feels that he was drinking much more than he had disclosed to hospital staff prior to his intubation. She states that when he did eat, he would eat well (in regards to  volume of food). Wife is a Therapist, sports at [employer].   He was last weighed on 2/20. Mild edema to BLE noted during NFPE.    Per notes: - aspiration PNA/pneumonitis, staph PNA - septic shock 2/2 PNA--worsening - AKI - necrotizing myopathy    Patient is currently intubated on ventilator support MV: 14.7 L/min Temp (24hrs), Avg:101.2 F (38.4 C), Min:100.58 F (38.1 C), Max:102.2 F (39 C) Propofol: 23.2 ml/hr (612 kcal/24 hours) BP: 113/64 and MAP: 78  Labs reviewed; Na: 132 mmol/l, K: 3.1 mmol/l, BUN: 29 mg/dl, Ca: 7.3 mg/dl, LFTs elevated.  Medications reviewed; 100 mg colace BID, 1 mg folvite/day, 15 ml multivitamin/day, 40 mg IV protonix/day, 17 g miralax/day, 20 mEq Klor-Con x2 doses 2/22, 10 mEq IV KCl x4 runs 2/22, 100 mg thiamine/day.  Drips; levo @ 18 ,cg/hr, amio @ 60 mg/hr, vaso @ 0.4 units/min, fentanyl @ 125 mcg/hr, propofol @ 40 mcg/kg/min.     NUTRITION - FOCUSED PHYSICAL EXAM:  completed; no muscle and no fat wasting; mild edema to BLE.   Diet Order:   Diet Order            Diet NPO time specified  Diet effective now                 EDUCATION NEEDS:   No education needs have been identified at this time  Skin:  Skin Assessment: Reviewed RN Assessment  Last BM:  2/20  Height:   Ht Readings from Last 1 Encounters:  08/28/20 _0  (1.803 m)    Weight:  Wt Readings from Last 1 Encounters:  08/28/20 96.8 kg    Estimated Nutritional Needs:  Kcal:  2521 kcal Protein:  140-155g Fluid:  >/= 2 L/day      Jarome Matin, MS, RD, LDN, CNSC Inpatient Clinical Dietitian RD pager # available in [place]  After hours/weekend pager # available in [facility]

## 2020-08-30 NOTE — Progress Notes (Signed)
Bilateral lower extremity venous duplex has been completed. Preliminary results can be found in CV Proc through chart review.  Results were given to the patient's nurse, Deborah.  08/30/20 4:36 PM Olen Cordial RVT

## 2020-08-30 NOTE — Progress Notes (Signed)
  Echocardiogram 2D Echocardiogram has been performed.  Joseph Ayala 08/30/2020, 11:59 AM

## 2020-08-30 NOTE — Telephone Encounter (Signed)
Called pt's wife no answer-LVM regarding FMLA form, form has been completed and ready for pick up.

## 2020-08-30 NOTE — Progress Notes (Signed)
eLink Physician-Brief Progress Note Patient Name: Joseph Ayala DOB: [birth year] MRN: [id number]   Date of Service  08/30/2020  HPI/Events of Note  Multiple issues: 1. Hypocalcemia - Ca++ = 7.3 which corrects to 8.66 (Low) given Albumin = 2.3. 2. Hyponatremia = Na+ = 129 --> 130 --> 132. Na+ continues to correct.   eICU Interventions  Plan: 1. Will replace Ca++.     Intervention Category Major Interventions: Electrolyte abnormality - evaluation and management  Tidus Upchurch Dennard Nip 08/30/2020, 5:38 AM

## 2020-08-30 NOTE — Progress Notes (Signed)
Critical Value- Troponin 391 Mechele Collin MD notified- order for repeat Troponin.

## 2020-08-30 NOTE — Progress Notes (Signed)
K+ 3.1 °Replaced per protocol  °

## 2020-08-30 NOTE — Progress Notes (Signed)
NAME:  Joseph Ayala, MRN:  [id number], DOB:  07-Apr-1966, LOS: 2 ADMISSION DATE:  08/27/2020, CONSULTATION DATE:  08/28/20 REFERRING MD:  Darliss Cheney, MD CHIEF COMPLAINT:  Acute hypoxemic respiratory failure  Brief History:  55 year old male on Suboxone for necrotizing myopathy who presents with acute hypoxemic respiratory failure secondary to community-acquired pneumonia/aspiration. PCCM initially consulted for evaluation for bronchoscopy for possible mucous plugging. Transferred to ICU after worsening respiratory failure and mental status requiring intubation on 2/21.  History of Present Illness:  [name] is a 55 year old male with history of necrotizing myopathy, prior history of opioid use disorder on Suboxone, hypertension who presented to the ED with cough, shortness of breath and chest pain.  He reports a 1 week history of productive cough, shortness of breath and associated substernal chest pain.  He is fully vaccinated against Covid including booster shot.  Admission Covid test negative  In the ED, he is afebrile but tachycardic and tachypneic.  O2 saturations were noted to be 88% on room air.  Admission chest x-ray with mild right lower lobe consolidation noted.  CTA obtained which was negative for PE however did show dense consolidation and groundglass opacities in the right middle and lower lobe as well as mild centrilobular emphysema.  Labs were significant for lactic acid 2.3 and BNP 114.  Otherwise blood counts and BMP overall unremarkable.  Troponin negative x2.  He was admitted to hospitalist service for acute hypoxemic respiratory failure/sepsis secondary to community-acquired pneumonia and started on antibiotics and 2 L oxygen.  Prior to shift change this morning he was found with O2 saturations in the 70s.  He was drowsy, tachypneic.  Rapid response was called and he was placed on nonrebreather.  Bedside CBG noted hypoglycemia to the 60s which improved with dextrose to 123.   EKG ordered which showed sinus tach.  Transfer to stepdown unit and pulmonary consulted to evaluate for possible mucous plug requiring bronchoscopy.  In the SDU he is drowsy but more alert on NRB. Now that is more interactive, he reports that his shortness of breath has improved compared to earlier this morning. Now is having severe abdominal and back pain that is new as well as feeling like his belly is tight  Past Medical History:  Hypertension, necrotizing myopathy, opioid use on Suboxone  Significant Hospital Events:  2/19 Arrived to ED for cough and chest pain x 1 week 2/20 Admitted to [facility] in early am. Intubated. Bronch 2/22 Worsening respiratory status and vasopressor requirement. Agitation on vent  Consults:  [facility] PCCM  Procedures:    Significant Diagnostic Tests:  CTA - right sided consolidation and ground glass opacities in the right and middle lobe. Background centrilobular emphysema present. No pulmonary embolism  Micro Data:  Blood culture 2/19> BAL resp cx 2/20> Staph Aureus BAL fungal cx 2/20> BAL AFB cx 2/20> Urine strep ag 2/21>neg Antimicrobials:  Ceftriaxone 2/20>2/21 Azithro 2/20>2/21 Vanc 2/21> Zosyn 2/21>  Interim History / Subjective:  Febrile. Episode of SVT/Aflutter this am. Agitation requiring increased sedation. Increased pressor requirement.   Objective   Blood pressure 130/72, pulse (!) 40, temperature (!) 101.12 F (38.4 C), resp. rate (!) 24, height _0  (1.803 m), weight 96.8 kg, SpO2 94 %. CVP:  [14 mmHg-18 mmHg] 14 mmHg  Vent Mode: PRVC FiO2 (%):  [30 %-45 %] 30 % Set Rate:  [24 bmp] 24 bmp Vt Set:  [600 mL] 600 mL PEEP:  [5 [place] Pressure:  [13 [id number] cmH20]  22 cmH20   Intake/Output Summary (Last 24 hours) at 08/30/2020 0955 Last data filed at 08/30/2020 0658 Gross per 24 hour  Intake 4615.96 ml  Output 1700 ml  Net 2915.96 ml   Filed Weights   08/27/20 1808 08/28/20 0214  Weight: 95.3 kg 96.8 kg    Physical Exam: General: Critically ill-appearing, no acute distress HENT: , AT, ETT in place Eyes: EOMI, no scleral icterus Respiratory: Clear to auscultation bilaterally.  No crackles, wheezing or rales Cardiovascular: RRR, -M/R/G, no JVD GI: BS+, soft, nontender Extremities:-Edema,-tenderness Neuro: Sedated with intermittent episodes of agitation, moves extremities x 4   Resolved Hospital Problem list     Assessment & Plan:  Acute hypoxemic respiratory failure secondary to Staphylococcus aureus pneumonia  Aspiration pneumonia/pneumonitis in setting of altered mental status Emphysema Rapid respiratory failure is likely due community acquired pneumonia exacerbated by aspiration pneumonitis escalating to dense consolidation with ARDS-like appearance limited to only right lung involvement.  --Full vent support. Wean FIO2 and PEEP for goal SpO2 88-95% --PAD protocol: Change from fentanyl to dilaudid for RASS goal -2 --Continue Vanc and Zosyn. De-escalate pending culture sensitivities --Follow-up cultures including BAL cultures 2/20>  --F/u urine legionella --VAP  Septic shock secondary to pneumonia - worsening Hypotension secondary to sedation --Continue broad spectrum antibiotics --De-escalate pending culture data --Wean Levo, vaso and neo gtt for MAP goal >65 --LR boluse --Trend LA --Echo pending. Will likely need TEE in setting of staph infection when more stable  Atrial flutter/SVT secondary to sepsis --Echo pending --Start amio bolus and gtt --Start treatment dose lovenox --Goal K >4 and Mg >2  AKI secondary to pre-renal - UOP good Mild metabolic acidosis --Trend UOP/Cr --Avoid nephrotoxic agents  Elevated LFTs - improving. Suspect ischemic hepatitis with shock --Trend BMET  Hypokalemia --Replete --Repeat BMET this afternoon  EtOH Abuse Last drink possibly yesterday vs a few days ago per patient. He reports 9-10 beers daily. --Thiamine, folate,  multivitamin --Sedation per PAD protocol  Necrotizing myopathy requiring suboxone Recently ran out of suboxone. Has taken OTC tylenol and ibuprofen for pain control --Hold suboxone while inpatient   Best practice (evaluated daily)  Diet: NPO, consider initiating TF Pain/Anxiety/Delirium protocol (if indicated): N/A VAP protocol (if indicated): N/A DVT prophylaxis: Lovenox GI prophylaxis: Per primary Glucose control: Per primary Mobility: As tolerated Disposition: SDU  Goals of Care:  Last date of multidisciplinary goals of care discussion:  Family and staff present:  Summary of discussion:  Follow up goals of care discussion due: 2/28 Code Status: Full code. Confirmed on 2/20  Labs   CBC: Recent Labs  Lab 08/27/20 1953 08/28/20 0348 08/28/20 1341 08/29/20 0300 08/30/20 0449  WBC 11.9* 7.4 8.0 20.0* 16.7*  NEUTROABS  --   --   --  18.7*  --   HGB 15.3 14.7 14.6 12.7* 10.0*  HCT 43.9 43.5 42.5 37.2* 30.4*  MCV 91.3 93.5 91.8 93.9 96.2  PLT 166 152 188 211 148*    Basic Metabolic Panel: Recent Labs  Lab 08/27/20 1953 08/29/20 0300 08/29/20 0841 08/30/20 0449  NA 134* 129* 130* 132*  K 3.8 3.8 3.7 3.1*  CL 97* 99 100 104  CO2 29 18* 18* 20*  GLUCOSE 125* 79 97 151*  BUN 20 38* 37* 29*  CREATININE 0.63 1.26* 1.08 0.67  CALCIUM 9.2 7.4* 7.7* 7.3*  MG  --  1.8  --   --    GFR: Estimated Creatinine Clearance: 126.7 mL/min (by C-G formula based on SCr of 0.67  mg/dL). Recent Labs  Lab 08/28/20 0348 08/28/20 1035 08/28/20 1341 08/28/20 1645 08/28/20 1932 08/29/20 0030 08/29/20 0300 08/29/20 1045 08/30/20 0449 08/30/20 0749  WBC 7.4  --  8.0  --   --   --  20.0*  --  16.7*  --   LATICACIDVEN 2.3*   < >  --    < > 4.7* 2.9*  --  2.1*  --  2.0*   < > = values in this interval not displayed.    Liver Function Tests: Recent Labs  Lab 08/28/20 1309 08/29/20 0300 08/29/20 0841 08/30/20 0749  AST 46* 98* 251* 151*  ALT 54* 69* 214* 195*  ALKPHOS 47  48 55 83  BILITOT 1.2 1.1 1.0 0.7  PROT 5.8* 4.8* 4.9* 5.3*  ALBUMIN 3.0* 2.3* 2.3* 2.3*   Recent Labs  Lab 08/28/20 1309  LIPASE 20  AMYLASE 18*   No results for input(s): AMMONIA in the last 168 hours.  ABG    Component Value Date/Time   PHART 7.322 (L) 08/28/2020 2300   PCO2ART 34.8 08/28/2020 2300   PO2ART 254 (H) 08/28/2020 2300   HCO3 17.1 (L) 08/28/2020 2300   ACIDBASEDEF 7.2 (H) 08/28/2020 2300   O2SAT 99.7 08/28/2020 2300     Coagulation Profile: Recent Labs  Lab 08/28/20 0418  INR 1.2    Cardiac Enzymes: No results for input(s): CKTOTAL, CKMB, CKMBINDEX, TROPONINI in the last 168 hours.  HbA1C: No results found for: HGBA1C  CBG: Recent Labs  Lab 08/28/20 0631 08/28/20 0645 08/28/20 1544  GLUCAP 67* 123* 84    The patient is critically ill with multiple organ systems failure and requires high complexity decision making for assessment and support, frequent evaluation and titration of therapies, application of advanced monitoring technologies and extensive interpretation of multiple databases.  Independent Critical Care Time: 68 Minutes.   Rodman Pickle, M.D. [facility] Pulmonary/Critical Care Medicine 08/30/2020 9:55 AM   Please see Amion for pager number to reach on-call Pulmonary and Critical Care Team.

## 2020-08-30 NOTE — Progress Notes (Signed)
Per Amparo Bristol, MD, RN to page E-Link one hour after administering Tylenol is still running fever, night shift RN aware

## 2020-08-30 NOTE — Progress Notes (Signed)
Pharmacy Antibiotic Note  Joseph Ayala is a 55 y.o. male with hx necrotizing myopathy and opioid use disorder on Suboxone presented to the ED on 08/27/2020 with c/o CP. Abdominal CT on 2/20 showed findings consistent with PNA. Pharmacy has been consulted to dose vancomycin and zosyn for PNA.   Plan: Continue Azithromycin per MD Continue Zosyn 3.375g IV Q8H infused over 4hrs. Increase to Vancomycin 1750 mg IV q12h.  (SCr rounded to 0.8, Vd 0.72, Est AUC 507) Follow up renal function, culture results, and clinical course.  ______________________________________  Height: _0  (180.3 cm) Weight: 96.8 kg (213 lb 6.5 oz) IBW/kg (Calculated) : 75.3  Temp (24hrs), Avg:101 F (38.3 C), Min:100.22 F (37.9 C), Max:102.2 F (39 C)  Recent Labs  Lab 08/27/20 1953 08/28/20 0348 08/28/20 1035 08/28/20 1341 08/28/20 1645 08/28/20 1932 08/29/20 0030 08/29/20 0300 08/29/20 0841 08/29/20 1045 08/30/20 0449 08/30/20 0749  WBC 11.9* 7.4  --  8.0  --   --   --  20.0*  --   --  16.7*  --   CREATININE 0.63  --   --   --   --   --   --  1.26* 1.08  --  0.67  --   LATICACIDVEN  --  2.3*   < >  --  4.3* 4.7* 2.9*  --   --  2.1*  --  2.0*   < > = values in this interval not displayed.    Estimated Creatinine Clearance: 126.7 mL/min (by C-G formula based on SCr of 0.67 mg/dL).    Allergies  Allergen Reactions  . Vicodin [Hydrocodone-Acetaminophen] Nausea And Vomiting   Antimicrobials this admission:  2/20 CTX >>2/20 2/20 Azithro >> 2/20 vanc>> 2/20 zosyn>>  Dose adjustments this admission:  2/22 SCr decreased, empiric vancomycin dose adjustment for renal function.  Microbiology results:  2/19 SARS CoV2: negative 2/20 Acid Fast Culture (BAL):  2/20 BAL: rare GPC pairs/clusters 2/20 MRSA PCR: positive 2/20 BCx: ngtd 2/21 Strep pneumo UrAg: neg   Thank you for allowing pharmacy to be a part of this patient's care.  Gretta Arab PharmD, BCPS Clinical Pharmacist [facility] main  pharmacy [phone] 08/30/2020 9:12 AM

## 2020-08-30 NOTE — Progress Notes (Addendum)
eLink Physician-Brief Progress Note Patient Name: Joseph Ayala DOB: 1965-11-27 MRN: [id number]   Date of Service  08/30/2020  HPI/Events of Note  Notified of Phos at 1.6, K 3.8, crea 0.74.  Pt with fever at 101F, already on antibiotics.    eICU Interventions  Replete with Kphos.  Alternate ibuprofen with tylenol.     Intervention Category Minor Interventions: Electrolytes abnormality - evaluation and management;Other:  Larinda Buttery 08/30/2020, 8:17 PM   5:18 AM Notified of K 3.5, Ca 7.5 corrected for low albumin is 9. Na 127, crea 0.76.  Plan> Replete K.

## 2020-08-31 ENCOUNTER — Inpatient Hospital Stay (HOSPITAL_COMMUNITY): Payer: Medicare HMO

## 2020-08-31 DIAGNOSIS — J189 Pneumonia, unspecified organism: Secondary | ICD-10-CM | POA: Diagnosis not present

## 2020-08-31 LAB — COMPREHENSIVE METABOLIC PANEL
ALT: 141 U/L — ABNORMAL HIGH (ref 0–44)
AST: 99 U/L — ABNORMAL HIGH (ref 15–41)
Albumin: 2.1 g/dL — ABNORMAL LOW (ref 3.5–5.0)
Alkaline Phosphatase: 156 U/L — ABNORMAL HIGH (ref 38–126)
Anion gap: 11 (ref 5–15)
BUN: 26 mg/dL — ABNORMAL HIGH (ref 6–20)
CO2: 18 mmol/L — ABNORMAL LOW (ref 22–32)
Calcium: 7.5 mg/dL — ABNORMAL LOW (ref 8.9–10.3)
Chloride: 98 mmol/L (ref 98–111)
Creatinine, Ser: 0.76 mg/dL (ref 0.61–1.24)
GFR, Estimated: >60 mL/min (ref >60)
Glucose, Bld: 134 mg/dL — ABNORMAL HIGH (ref 70–99)
Potassium: 3.5 mmol/L (ref 3.5–5.1)
Sodium: 127 mmol/L — ABNORMAL LOW (ref 135–145)
Total Bilirubin: 0.7 mg/dL (ref 0.3–1.2)
Total Protein: 4.9 g/dL — ABNORMAL LOW (ref 6.5–8.1)

## 2020-08-31 LAB — CBC
HCT: 31.2 % — ABNORMAL LOW (ref 39.0–52.0)
Hemoglobin: 10.5 g/dL — ABNORMAL LOW (ref 13.0–17.0)
MCH: 31.6 pg (ref 26.0–34.0)
MCHC: 33.7 g/dL (ref 30.0–36.0)
MCV: 94 fL (ref 80.0–100.0)
Platelets: 150 10*3/uL (ref 150–400)
RBC: 3.32 MIL/uL — ABNORMAL LOW (ref 4.22–5.81)
RDW: 14.9 % (ref 11.5–15.5)
WBC: 14.1 10*3/uL — ABNORMAL HIGH (ref 4.0–10.5)
nRBC: 0 % (ref 0.0–0.2)

## 2020-08-31 LAB — CULTURE, RESPIRATORY W GRAM STAIN

## 2020-08-31 LAB — PHOSPHORUS
Phosphorus: 3 mg/dL (ref 2.5–4.6)
Phosphorus: 1.9 mg/dL — ABNORMAL LOW (ref 2.5–4.6)

## 2020-08-31 LAB — GLUCOSE, CAPILLARY
Glucose-Capillary: 108 mg/dL — ABNORMAL HIGH (ref 70–99)
Glucose-Capillary: 112 mg/dL — ABNORMAL HIGH (ref 70–99)
Glucose-Capillary: 120 mg/dL — ABNORMAL HIGH (ref 70–99)
Glucose-Capillary: 122 mg/dL — ABNORMAL HIGH (ref 70–99)
Glucose-Capillary: 76 mg/dL (ref 70–99)
Glucose-Capillary: 97 mg/dL (ref 70–99)

## 2020-08-31 LAB — COOXEMETRY PANEL
Carboxyhemoglobin: 1.3 % (ref 0.5–1.5)
Methemoglobin: 1.1 % (ref 0.0–1.5)
O2 Saturation: 73.1 %
Total hemoglobin: 9.9 g/dL — ABNORMAL LOW (ref 12.0–16.0)

## 2020-08-31 LAB — LACTIC ACID, PLASMA: Lactic Acid, Venous: 2 mmol/L (ref 0.5–1.9)

## 2020-08-31 LAB — MAGNESIUM: Magnesium: 2.4 mg/dL (ref 1.7–2.4)

## 2020-08-31 LAB — TROPONIN I (HIGH SENSITIVITY): Troponin I (High Sensitivity): 377 ng/L (ref <18)

## 2020-08-31 IMAGING — CT CT CHEST-ABD-PELV W/O CM
2 of 4 series · 13 of 36 positions shown, 15 images · non-contrast
Comparison: [DATE]

CLINICAL DATA: Hypoxia and hypoxemia.

EXAM:
CT CHEST, ABDOMEN AND PELVIS WITHOUT CONTRAST
TECHNIQUE: Multidetector CT imaging of the chest, abdomen and pelvis was
performed following the standard protocol without IV contrast.

[Series 2: cap w/o · axial · non-contrast · 0.93mm/px · z∈[-796,-240]mm · 10 of 137 slices shown, 12 images]
[im 13/137  mediastinal]
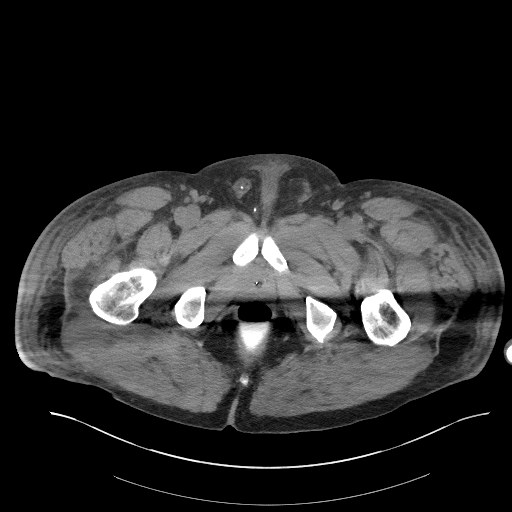
[im 13/137  bone]
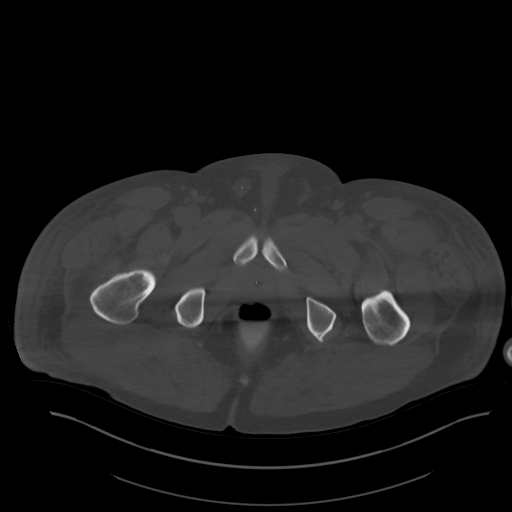
[im 25/137  mediastinal]
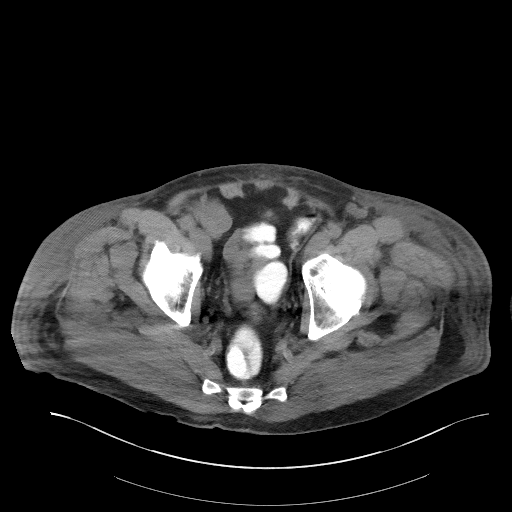
[im 38/137  mediastinal]
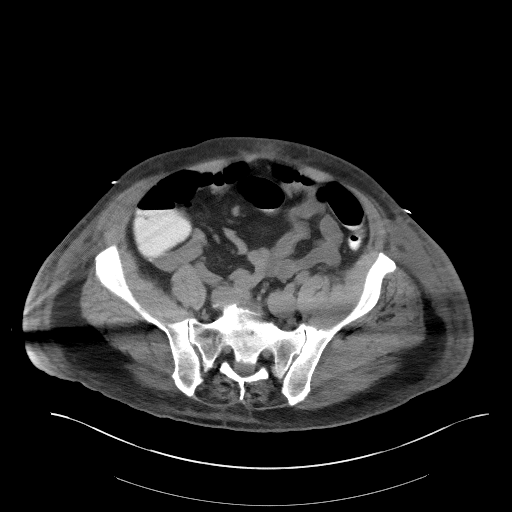
[im 50/137  mediastinal]
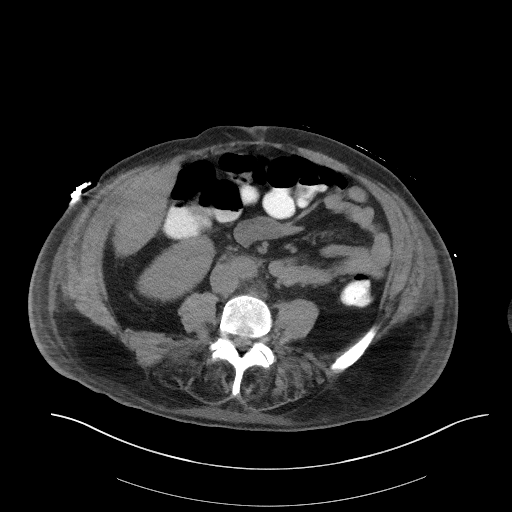
[im 62/137  mediastinal]
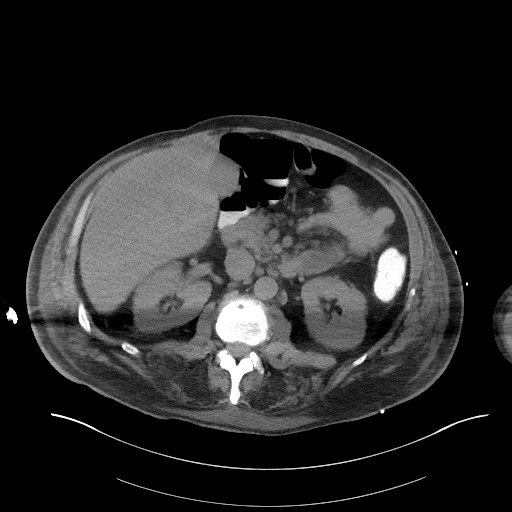
[im 75/137  mediastinal]
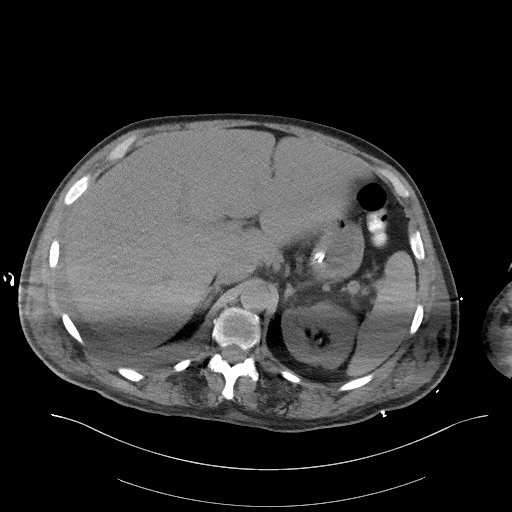
[im 87/137  mediastinal]
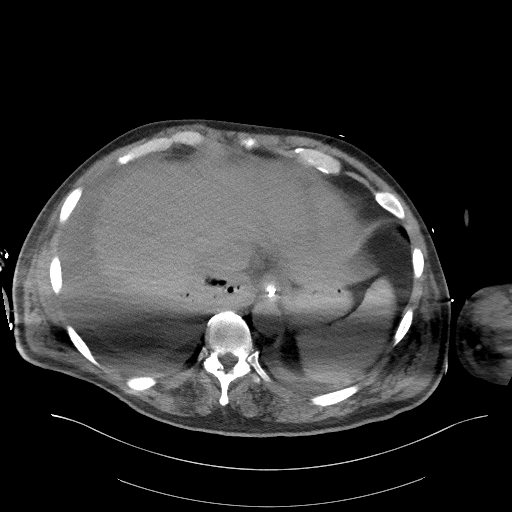
[im 99/137  mediastinal]
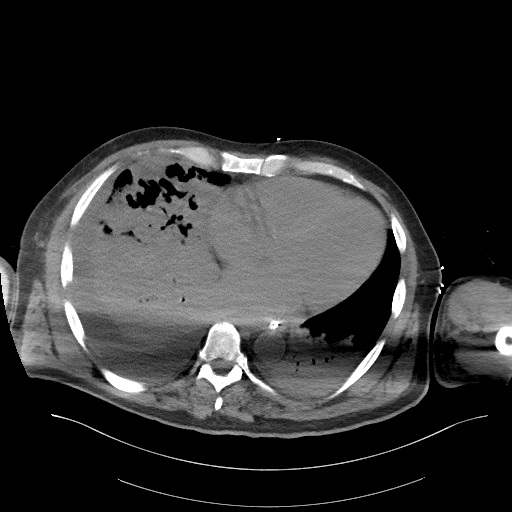
[im 112/137  mediastinal]
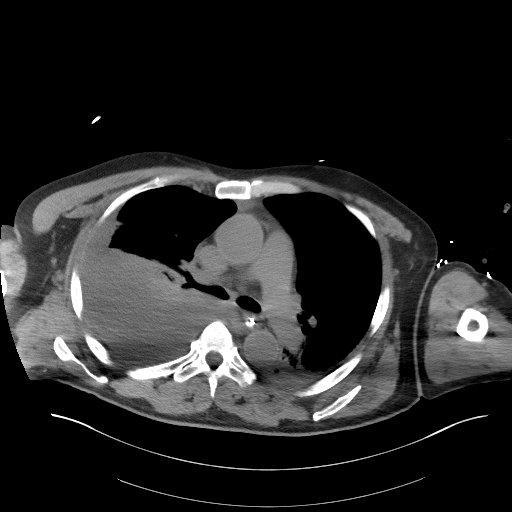
[im 112/137  bone]
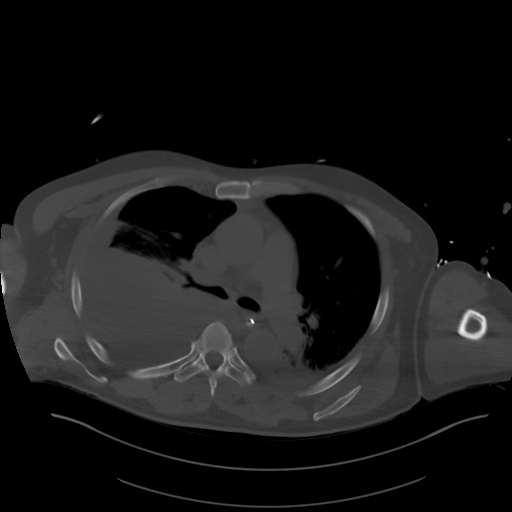
[im 124/137  mediastinal]
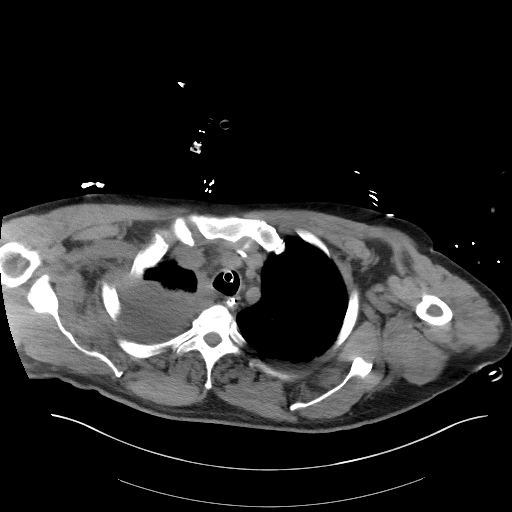

[Series 4: coronals · coronal · 0.92mm/px · 3 of 162 slices shown]
[im 33/162  mediastinal]
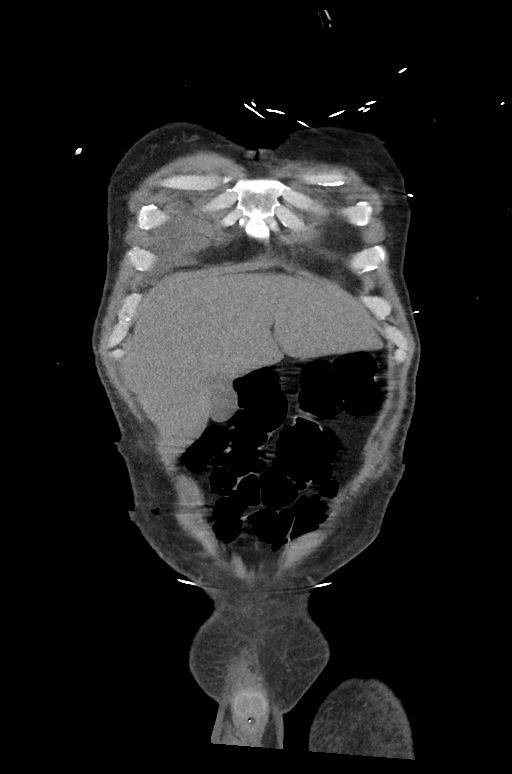
[im 65/162  mediastinal]
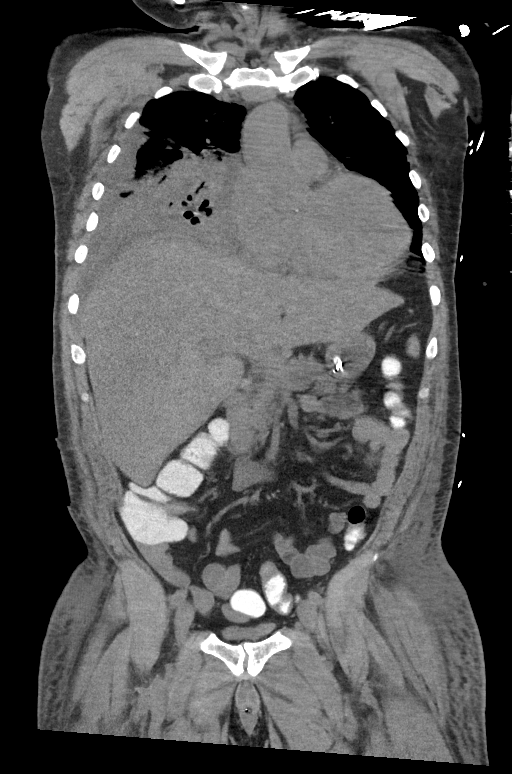
[im 97/162  mediastinal]
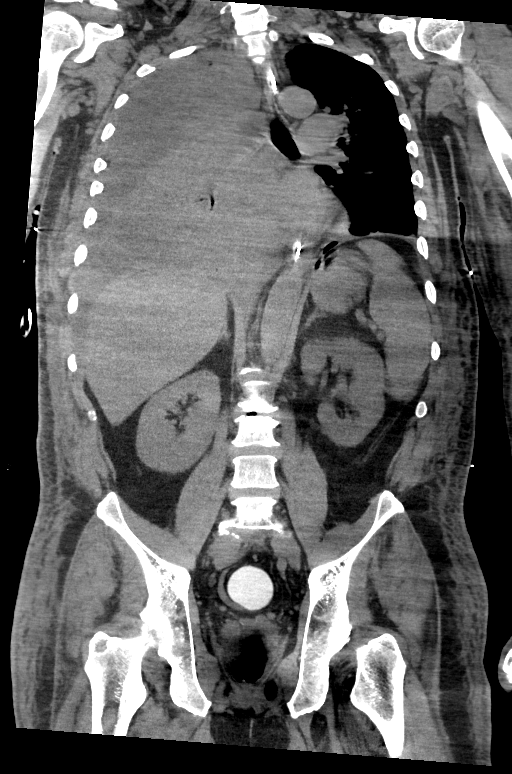

[13 of 36 positions shown; findings below may reference images not displayed]

FINDINGS: CT CHEST FINDINGS

Cardiovascular: Heart size appears within normal limits. There is no
pericardial effusion. Coronary artery calcifications. Left IJ
catheter tip terminates in the proximal SVC.

Mediastinum/Nodes: ET tube tip terminates above the carina. There is
a nasogastric tube within the body of stomach.

Normal appearance of the thyroid gland. The trachea appears patent
and is midline. Normal appearance of the esophagus. No enlarged
axillary, supraclavicular, or mediastinal adenopathy.

Lungs/Pleura: Interval increase in volume of moderate to large right
pleural effusion which appears partially loculated. Small left
pleural effusion is new from the previous exam. Extensive
progressive airspace consolidation within the right middle lobe and
right lower lobe. Progressive cavitation within right middle lobe
and posteromedial right lower lobe airspace disease. There is
subpleural, plate-like atelectasis involving the posterior and basal
right upper lobe. Dense airspace consolidation with air bronchograms
identified within the left lower lobe appears increased from
previous exam.

Musculoskeletal: No chest wall mass or suspicious bone lesions
identified.

CT ABDOMEN PELVIS FINDINGS

Hepatobiliary: No focal liver abnormality is seen. No gallstones,
gallbladder wall thickening, or biliary dilatation.

Pancreas: Unremarkable. No pancreatic ductal dilatation or
surrounding inflammatory changes.

Spleen: Normal in size without focal abnormality.

Adrenals/Urinary Tract: Normal adrenal glands. Calcification within
the left mid kidney measures 5 mm. No mass or hydronephrosis
identified bilaterally. Urinary bladder is collapsed around a Foley
catheter.

Stomach/Bowel: Stomach is nondistended. The appendix is not
confidently identified. No bowel wall thickening, inflammation, or
distension. Enteric contrast material is identified throughout the
colon up to the rectum.

Vascular/Lymphatic: No significant vascular findings are present. No
enlarged abdominal or pelvic lymph nodes.

Reproductive: Prostate is unremarkable.

Other: No significant free fluid or fluid collections within the
abdomen or pelvis. No signs of abscess. Fat containing left inguinal
hernia is again noted. Progressive subcutaneous soft tissue
stranding throughout the body wall compatible with anasarca.

Musculoskeletal: Degenerative disc disease identified. No acute or
suspicious osseous findings
IMPRESSION: 1. Interval increase in volume of moderate to large right pleural
effusion which appears partially loculated and may reflect
developing empyema. New small left pleural effusion.
2. Extensive progressive airspace consolidation within the right
middle lobe and right lower lobe. Progressive areas of cavitation
within the right middle lobe and posteromedial right lower lobe
compatible with necrotizing pneumonia
3. Increase in size of left lower lobe airspace consolidation with
air bronchograms.
4. No acute findings within the abdomen or pelvis. No signs of
abscess.
5. Progressive subcutaneous soft tissue stranding throughout the
body wall compatible with anasarca.
6. Nonobstructing left renal calculus.
7. Aortic atherosclerosis.

Aortic Atherosclerosis ([54]-[54]).

## 2020-08-31 MED ORDER — IBUPROFEN 100 MG/5ML PO SUSP
400.0000 mg | Freq: Four times a day (QID) | ORAL | Status: DC | PRN
  Administered 2020-09-01 – 2020-09-24 (×17): 400 mg
  Filled 2020-08-31 (×18): qty 20

## 2020-08-31 MED ORDER — LACTATED RINGERS IV BOLUS
1000.0000 mL | Freq: Once | INTRAVENOUS | Status: AC
  Administered 2020-08-31: 1000 mL via INTRAVENOUS

## 2020-08-31 MED ORDER — POTASSIUM CHLORIDE 20 MEQ PO PACK
40.0000 meq | PACK | Freq: Once | ORAL | Status: AC
  Administered 2020-08-31: 40 meq
  Filled 2020-08-31: qty 2

## 2020-08-31 NOTE — Progress Notes (Signed)
NAME:  Joseph Ayala, MRN:  [id number], DOB:  1965-11-18, LOS: 3 ADMISSION DATE:  08/27/2020, CONSULTATION DATE:  08/28/20 REFERRING MD:  Darliss Cheney, MD CHIEF COMPLAINT:  Acute hypoxemic respiratory failure  Brief History:  55 year old male on Suboxone for necrotizing myopathy who presents with acute hypoxemic respiratory failure secondary to community-acquired pneumonia/aspiration. PCCM initially consulted for evaluation for bronchoscopy for possible mucous plugging. Transferred to ICU after worsening respiratory failure and mental status requiring intubation on 2/21.  History of Present Illness:  Joseph Ayala is a 55 year old male with history of necrotizing myopathy, prior history of opioid use disorder on Suboxone, hypertension who presented to the ED with cough, shortness of breath and chest pain.  He reports a 1 week history of productive cough, shortness of breath and associated substernal chest pain.  He is fully vaccinated against Covid including booster shot.  Admission Covid test negative  In the ED, he is afebrile but tachycardic and tachypneic.  O2 saturations were noted to be 88% on room air.  Admission chest x-ray with mild right lower lobe consolidation noted.  CTA obtained which was negative for PE however did show dense consolidation and groundglass opacities in the right middle and lower lobe as well as mild centrilobular emphysema.  Labs were significant for lactic acid 2.3 and BNP 114.  Otherwise blood counts and BMP overall unremarkable.  Troponin negative x2.  He was admitted to hospitalist service for acute hypoxemic respiratory failure/sepsis secondary to community-acquired pneumonia and started on antibiotics and 2 L oxygen.  Prior to shift change this morning he was found with O2 saturations in the 70s.  He was drowsy, tachypneic.  Rapid response was called and he was placed on nonrebreather.  Bedside CBG noted hypoglycemia to the 60s which improved with dextrose to 123.   EKG ordered which showed sinus tach.  Transfer to stepdown unit and pulmonary consulted to evaluate for possible mucous plug requiring bronchoscopy.  In the SDU he is drowsy but more alert on NRB. Now that is more interactive, he reports that his shortness of breath has improved compared to earlier this morning. Now is having severe abdominal and back pain that is new as well as feeling like his belly is tight  Past Medical History:  Hypertension, necrotizing myopathy, opioid use on Suboxone  Significant Hospital Events:  2/19 Arrived to ED for cough and chest pain x 1 week 2/20 Admitted to [facility] in early am. Intubated. Bronch 2/22 Worsening respiratory status and vasopressor requirement. Agitation on vent 2/33 Improving pressor requirement  Consults:  TRH PCCM  Procedures:    Significant Diagnostic Tests:  CTA - right sided consolidation and ground glass opacities in the right and middle lobe. Background centrilobular emphysema present. No pulmonary embolism  Micro Data:  Blood culture 2/19>NGTD BAL resp cx 2/20> MRSA BAL fungal cx 2/20> BAL AFB cx 2/20> Urine strep ag 2/21>neg Urine legionella ag 2/21>neg Antimicrobials:  Ceftriaxone 2/20>2/21 Azithro 2/20>2/21 Vanc 2/21> Zosyn 2/21>2/23  Interim History / Subjective:  Febrile Tmax 101.6 Improving vasopressor requirement  Objective   Blood pressure 130/72, pulse 95, temperature (!) 101.66 F (38.7 C), resp. rate (!) 24, height _0  (1.803 m), weight 109.3 kg, SpO2 95 %. CVP:  [14 mmHg-17 mmHg] 15 mmHg  Vent Mode: PRVC FiO2 (%):  [30 %] 30 % Set Rate:  [24 bmp] 24 bmp Vt Set:  [600 mL] 600 mL PEEP:  [5 cmH20] 5 cmH20 Plateau Pressure:  [20 cmH20-22 cmH20] 20 cmH20  Intake/Output Summary (Last 24 hours) at 08/31/2020 1305 Last data filed at 08/31/2020 7416 Gross per 24 hour  Intake 4402.59 ml  Output 1825 ml  Net 2577.59 ml   Filed Weights   08/27/20 1808 08/28/20 0214 08/31/20 0400  Weight: 95.3 kg 96.8 kg  109.3 kg   Physical Exam: General: Critically ill-appearing, no acute distress HENT: [place], AT, ETT in place Eyes: EOMI, no scleral icterus Respiratory: Clear to auscultation bilaterally.  No crackles, wheezing or rales Cardiovascular: RRR, -M/R/G, no JVD GI: BS+, soft, nontender Extremities: Upper extremity edema bilaterally, no lower extremity edema,-tenderness Neuro: Sedated with intermittent episodes of agitation, moves extremities x 4, mottling of pedal extremities up to ankle, pedal pulses present GU: Foley in place  Resolved Hospital Problem list   Elevated LFTs  Assessment & Plan:  Acute hypoxemic respiratory failure secondary to MRSA pneumonia  Aspiration pneumonia/pneumonitis in setting of altered mental status Emphysema Rapid respiratory failure is likely due community acquired pneumonia exacerbated by aspiration pneumonitis escalating to dense consolidation with ARDS-like appearance limited to only right lung involvement. Currently on minimal vent settings --Full vent support. Wean FIO2 and PEEP for goal SpO2 88-95%.  --WUA/SBT daily however no plans to extubate in setting of acute illness --PAD protocol: Dilaudid gtt and PRN versed for RASS goal -2 --Continue Vanc. Stop Zosyn based on cultures --VAP  Septic shock secondary to pneumonia - improving vasopressor requirement Cardiogenic shock  Remains persistently febrile and elevated lactic acidosis --Continue Vanc --Wean Levo and vaso gtt for MAP goal >65 --Repeat LR bolus --Trend LA --Check CVP. May need to consider diuresis if elevated and/or unresponsive to fluid boluses --Plan for bedside ultrasound to evaluate right pleural effusion --Will likely need TEE in setting of staph infection when more stable  New onset systolic heart failure (EF 35-40% with global LV hypokinesis, RV enlargement) secondary to ischemic vs alcoholic-induced cardiomyopathy New onset atrial flutter/SVT secondary to sepsis Elevated troponin  thought secondary to demand ischemia Peak trop 391 --Consulted cardiology for new heart failure and TEE --Continue amio gtt --Treatment dose lovenox --Goal K >4 and Mg >2  AKI secondary to pre-renal - good UOP Mild metabolic acidosis --Trend UOP/Cr --Avoid nephrotoxic agents  Hypokalemia --Repleted  EtOH Abuse Last drink possibly yesterday vs a few days prior to admission per patient. He reports 9-10 beers daily. --Thiamine, folate, multivitamin --Sedation per PAD protocol  Necrotizing myopathy requiring suboxone Recently ran out of suboxone. Has taken OTC tylenol and ibuprofen for pain control --Hold suboxone while inpatient   Best practice (evaluated daily)  Diet: TF Pain/Anxiety/Delirium protocol (if indicated): Yes VAP protocol (if indicated): Yes DVT prophylaxis: Lovenox, treatment dose GI prophylaxis: Protonix Glucose control: CBG q4h Mobility: As tolerated Disposition: SDU  Goals of Care:  Last date of multidisciplinary goals of care discussion:  Family and staff present:  Summary of discussion:  Follow up goals of care discussion due: 2/28 Code Status: Full code. Confirmed on 2/20  Updated wife daily. Last updated 2/22  Labs   CBC: Recent Labs  Lab 08/28/20 1341 08/29/20 0300 08/30/20 0449 08/30/20 2019 08/31/20 1231  WBC 8.0 20.0* 16.7* 15.2* 14.1*  NEUTROABS  --  18.7*  --   --   --   HGB 14.6 12.7* 10.0* 9.6* 10.5*  HCT 42.5 37.2* 30.4* 28.4* 31.2*  MCV 91.8 93.9 96.2 94.0 94.0  PLT 188 211 148* 142* 384    Basic Metabolic Panel: Recent Labs  Lab 08/29/20 0300 08/29/20 0841 08/30/20 0449 08/30/20 1309 08/30/20  1646 08/31/20 0348  NA 129* 130* 132* 130*  --  127*  K 3.8 3.7 3.1* 3.8  --  3.5  CL 99 100 104 100  --  98  CO2 18* 18* 20* 19*  --  18*  GLUCOSE 79 97 151* 164*  --  134*  BUN 38* 37* 29* 28*  --  26*  CREATININE 1.26* 1.08 0.67 0.74  --  0.76  CALCIUM 7.4* 7.7* 7.3* 7.9*  --  7.5*  MG 1.8  --   --   --   --  2.4  PHOS   --   --   --  1.7* 1.6* 3.0   GFR: Estimated Creatinine Clearance: 134.3 mL/min (by C-G formula based on SCr of 0.76 mg/dL). Recent Labs  Lab 08/29/20 0300 08/29/20 1045 08/30/20 0449 08/30/20 0749 08/30/20 1021 08/30/20 2019 08/31/20 1034 08/31/20 1231  WBC 20.0*  --  16.7*  --   --  15.2*  --  14.1*  LATICACIDVEN  --  2.1*  --  2.0* 2.4*  --  2.0*  --     Liver Function Tests: Recent Labs  Lab 08/28/20 1309 08/29/20 0300 08/29/20 0841 08/30/20 0749 08/31/20 0348  AST 46* 98* 251* 151* 99*  ALT 54* 69* 214* 195* 141*  ALKPHOS 47 48 55 83 156*  BILITOT 1.2 1.1 1.0 0.7 0.7  PROT 5.8* 4.8* 4.9* 5.3* 4.9*  ALBUMIN 3.0* 2.3* 2.3* 2.3* 2.1*   Recent Labs  Lab 08/28/20 1309  LIPASE 20  AMYLASE 18*   No results for input(s): AMMONIA in the last 168 hours.  ABG    Component Value Date/Time   PHART 7.322 (L) 08/28/2020 2300   PCO2ART 34.8 08/28/2020 2300   PO2ART 254 (H) 08/28/2020 2300   HCO3 17.1 (L) 08/28/2020 2300   ACIDBASEDEF 7.2 (H) 08/28/2020 2300   O2SAT 99.7 08/28/2020 2300     Coagulation Profile: Recent Labs  Lab 08/28/20 0418  INR 1.2    Cardiac Enzymes: No results for input(s): CKTOTAL, CKMB, CKMBINDEX, TROPONINI in the last 168 hours.  HbA1C: No results found for: HGBA1C  CBG: Recent Labs  Lab 08/30/20 1928 08/30/20 2327 08/31/20 0331 08/31/20 0730 08/31/20 1117  GLUCAP 112* 88 122* 108* 120*    The patient is critically ill with multiple organ systems failure and requires high complexity decision making for assessment and support, frequent evaluation and titration of therapies, application of advanced monitoring technologies and extensive interpretation of multiple databases.  Independent Critical Care Time: 35 Minutes.   Rodman Pickle, M.D. [facility] Pulmonary/Critical Care Medicine 08/31/2020 1:05 PM   Please see Amion for pager number to reach on-call Pulmonary and Critical Care Team.

## 2020-08-31 NOTE — TOC Progression Note (Signed)
Transition of Care [facility]) - Progression Note    Patient Details  Name: Joseph Ayala MRN: [id number] Date of Birth: [birth year]  [facility] of [facility]) CM/SW Contact  Golda Acre, RN Phone Number: 08/31/2020, 8:24 AM  Clinical Narrative:    Significant Hospital Events:  2/19 Arrived to ED for cough and chest pain x 1 week 2/20 Admitted to [facility] in early am. Intubated. Bronch 2/22 Worsening respiratory status and vasopressor requirement. Agitation on vent Remains on the vent at 30%, iv aminodarone, iv sedation, iv abx, iv pressor. PLan- unable to determine at this time.  Expected Discharge Plan: Home/Self Care Barriers to Discharge: Continued Medical Work up  Expected Discharge Plan and Services Expected Discharge Plan: Home/Self Care   Discharge Planning Services: CM Consult   Living arrangements for the past 2 months: Single Family Home                                       Social Determinants of Health (SDOH) Interventions    Readmission Risk Interventions No flowsheet data found.

## 2020-09-01 ENCOUNTER — Inpatient Hospital Stay (HOSPITAL_COMMUNITY): Payer: Medicare HMO

## 2020-09-01 ENCOUNTER — Encounter (HOSPITAL_COMMUNITY): Payer: Self-pay | Admitting: Family Medicine

## 2020-09-01 DIAGNOSIS — I5041 Acute combined systolic (congestive) and diastolic (congestive) heart failure: Secondary | ICD-10-CM | POA: Diagnosis not present

## 2020-09-01 DIAGNOSIS — J1520 Pneumonia due to staphylococcus, unspecified: Secondary | ICD-10-CM | POA: Diagnosis not present

## 2020-09-01 DIAGNOSIS — I4891 Unspecified atrial fibrillation: Secondary | ICD-10-CM

## 2020-09-01 DIAGNOSIS — J9601 Acute respiratory failure with hypoxia: Secondary | ICD-10-CM | POA: Diagnosis not present

## 2020-09-01 DIAGNOSIS — R579 Shock, unspecified: Secondary | ICD-10-CM

## 2020-09-01 LAB — CBC
HCT: 30 % — ABNORMAL LOW (ref 39.0–52.0)
Hemoglobin: 10 g/dL — ABNORMAL LOW (ref 13.0–17.0)
MCH: 31.6 pg (ref 26.0–34.0)
MCHC: 33.3 g/dL (ref 30.0–36.0)
MCV: 94.9 fL (ref 80.0–100.0)
Platelets: 150 10*3/uL (ref 150–400)
RBC: 3.16 MIL/uL — ABNORMAL LOW (ref 4.22–5.81)
RDW: 15 % (ref 11.5–15.5)
WBC: 12.5 10*3/uL — ABNORMAL HIGH (ref 4.0–10.5)
nRBC: 0 % (ref 0.0–0.2)

## 2020-09-01 LAB — LACTIC ACID, PLASMA: Lactic Acid, Venous: 1.4 mmol/L (ref 0.5–1.9)

## 2020-09-01 LAB — BASIC METABOLIC PANEL
Anion gap: 10 (ref 5–15)
BUN: 14 mg/dL (ref 6–20)
CO2: 22 mmol/L (ref 22–32)
Calcium: 8.2 mg/dL — ABNORMAL LOW (ref 8.9–10.3)
Chloride: 107 mmol/L (ref 98–111)
Creatinine, Ser: 0.45 mg/dL — ABNORMAL LOW (ref 0.61–1.24)
GFR, Estimated: >60 mL/min (ref >60)
Glucose, Bld: 113 mg/dL — ABNORMAL HIGH (ref 70–99)
Potassium: 3.6 mmol/L (ref 3.5–5.1)
Sodium: 139 mmol/L (ref 135–145)

## 2020-09-01 LAB — COMPREHENSIVE METABOLIC PANEL
ALT: 131 U/L — ABNORMAL HIGH (ref 0–44)
AST: 89 U/L — ABNORMAL HIGH (ref 15–41)
Albumin: 2 g/dL — ABNORMAL LOW (ref 3.5–5.0)
Alkaline Phosphatase: 285 U/L — ABNORMAL HIGH (ref 38–126)
Anion gap: 11 (ref 5–15)
BUN: 21 mg/dL — ABNORMAL HIGH (ref 6–20)
CO2: 21 mmol/L — ABNORMAL LOW (ref 22–32)
Calcium: 7.9 mg/dL — ABNORMAL LOW (ref 8.9–10.3)
Chloride: 102 mmol/L (ref 98–111)
Creatinine, Ser: 0.6 mg/dL — ABNORMAL LOW (ref 0.61–1.24)
GFR, Estimated: >60 mL/min (ref >60)
Glucose, Bld: 107 mg/dL — ABNORMAL HIGH (ref 70–99)
Potassium: 3.4 mmol/L — ABNORMAL LOW (ref 3.5–5.1)
Sodium: 134 mmol/L — ABNORMAL LOW (ref 135–145)
Total Bilirubin: 0.9 mg/dL (ref 0.3–1.2)
Total Protein: 5.1 g/dL — ABNORMAL LOW (ref 6.5–8.1)

## 2020-09-01 LAB — GLUCOSE, CAPILLARY
Glucose-Capillary: 103 mg/dL — ABNORMAL HIGH (ref 70–99)
Glucose-Capillary: 118 mg/dL — ABNORMAL HIGH (ref 70–99)
Glucose-Capillary: 83 mg/dL (ref 70–99)
Glucose-Capillary: 95 mg/dL (ref 70–99)

## 2020-09-01 LAB — MAGNESIUM: Magnesium: 2.4 mg/dL (ref 1.7–2.4)

## 2020-09-01 LAB — VANCOMYCIN, TROUGH: Vancomycin Tr: 14 ug/mL — ABNORMAL LOW (ref 15–20)

## 2020-09-01 LAB — TRIGLYCERIDES: Triglycerides: 574 mg/dL — ABNORMAL HIGH (ref <150)

## 2020-09-01 LAB — VANCOMYCIN, PEAK: Vancomycin Pk: 23 ug/mL — ABNORMAL LOW (ref 30–40)

## 2020-09-01 IMAGING — DX DG CHEST 1V PORT
1 series · 1 of 1 positions shown · non-contrast
Comparison: Chest radiographs [DATE] and CT [DATE]

CLINICAL DATA: Chest tube placement.

EXAM:
PORTABLE CHEST 1 VIEW

[chest ap]
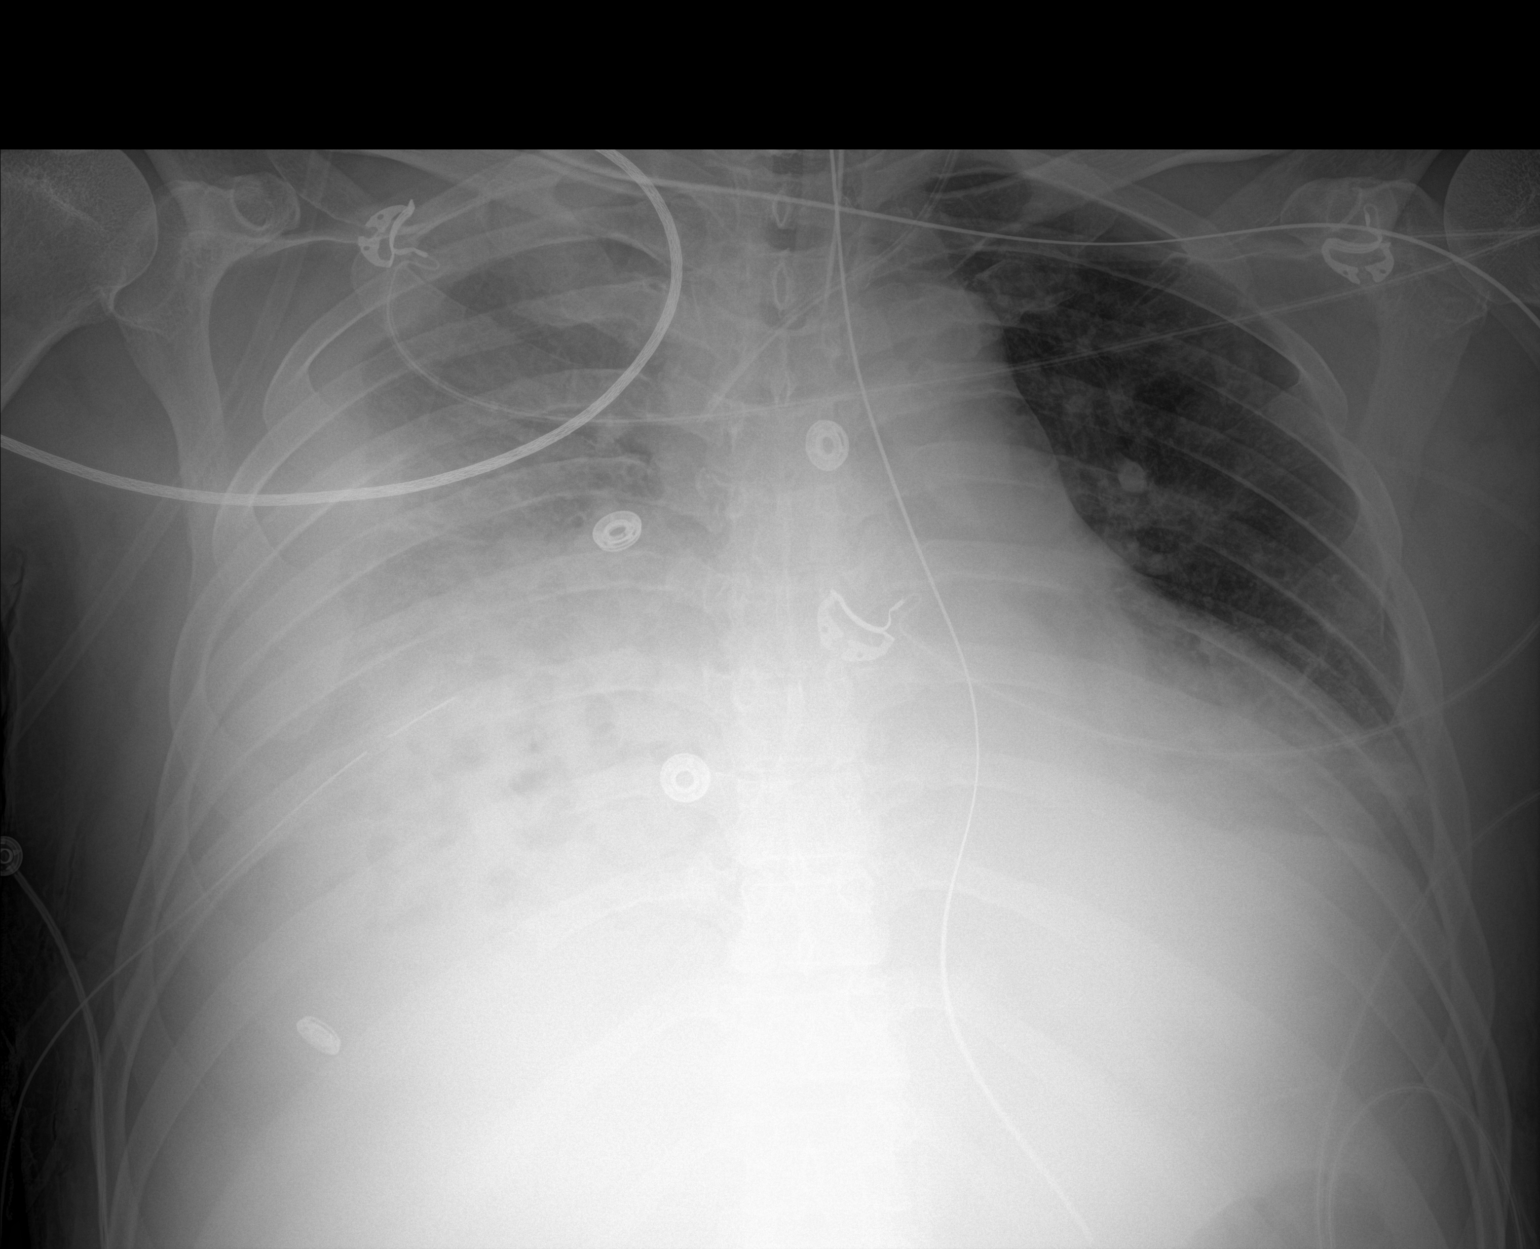

[1 of 1 positions shown; findings below may reference images not displayed]

FINDINGS: An endotracheal tube terminates approximately 3.5 cm above the
carina. A left jugular catheter terminates over the upper SVC. An
enteric tube courses into the abdomen with tip not imaged. A right
chest tube has been placed and terminates over the lower lung. There
is evidence of a moderate residual right pleural effusion which is
likely partially loculated laterally. Extensive right lung airspace
consolidation is similar to the prior radiographs with multiple
small lucencies in the right lung base corresponding to cavitation
on CT. Left basilar lung consolidation also has not significantly
changed. No pneumothorax is identified.
IMPRESSION: 1. Interval right chest tube placement. Moderate residual right
pleural effusion. No pneumothorax.
2. Unchanged right greater than left lung consolidation consistent
with pneumonia with cavitation in the right lung base.

## 2020-09-01 MED ORDER — SODIUM CHLORIDE 0.9% FLUSH: 10.0000 mL | INTRAVENOUS | Status: DC | PRN

## 2020-09-01 MED ORDER — MIDAZOLAM BOLUS VIA INFUSION
1.0000 mg | INTRAVENOUS | Status: DC | PRN
  Administered 2020-09-02 – 2020-09-06 (×18): 2 mg via INTRAVENOUS
  Filled 2020-09-01: qty 2

## 2020-09-01 MED ORDER — DOCUSATE SODIUM 50 MG/5ML PO LIQD: 100.0000 mg | Freq: Two times a day (BID) | ORAL | Status: DC

## 2020-09-01 MED ORDER — POLYETHYLENE GLYCOL 3350 17 G PO PACK
17.0000 g | PACK | Freq: Every day | ORAL | Status: DC
Start: 2020-09-01 — End: 2020-09-01

## 2020-09-01 MED ORDER — SODIUM CHLORIDE 0.9% FLUSH
10.0000 mL | Freq: Two times a day (BID) | INTRAVENOUS | Status: DC
  Administered 2020-09-01 – 2020-09-09 (×9): 10 mL

## 2020-09-01 MED ORDER — DEXTROSE IN LACTATED RINGERS 5 % IV SOLN: INTRAVENOUS | Status: DC

## 2020-09-01 MED ORDER — PANTOPRAZOLE SODIUM 40 MG PO PACK
40.0000 mg | PACK | Freq: Every day | ORAL | Status: DC
  Administered 2020-09-02 – 2020-09-23 (×22): 40 mg
  Filled 2020-09-01 (×22): qty 20

## 2020-09-01 MED ORDER — VANCOMYCIN HCL 2000 MG/400ML IV SOLN
2000.0000 mg | Freq: Two times a day (BID) | INTRAVENOUS | Status: DC
  Administered 2020-09-02 – 2020-09-09 (×15): 2000 mg via INTRAVENOUS
  Filled 2020-09-01 (×16): qty 400

## 2020-09-01 MED ORDER — POTASSIUM CHLORIDE 20 MEQ PO PACK
40.0000 meq | PACK | Freq: Once | ORAL | Status: AC
  Administered 2020-09-01: 40 meq
  Filled 2020-09-01: qty 2

## 2020-09-01 MED ORDER — MIDAZOLAM HCL 2 MG/2ML IJ SOLN: 1.0000 mg | Freq: Once | INTRAMUSCULAR | Status: DC

## 2020-09-01 MED ORDER — MIDAZOLAM 50MG/50ML (1MG/ML) PREMIX INFUSION
0.0000 mg/h | INTRAVENOUS | Status: DC
  Administered 2020-09-01: 15:00:00 8 mg/h via INTRAVENOUS
  Administered 2020-09-01: 2 mg/h via INTRAVENOUS
  Administered 2020-09-01: 8 mg/h via INTRAVENOUS
  Administered 2020-09-02 (×3): 9 mg/h via INTRAVENOUS
  Administered 2020-09-02: 04:00:00 8 mg/h via INTRAVENOUS
  Administered 2020-09-03 (×2): 7 mg/h via INTRAVENOUS
  Administered 2020-09-03: 6 mg/h via INTRAVENOUS
  Administered 2020-09-04: 5 mg/h via INTRAVENOUS
  Administered 2020-09-04: 3 mg/h via INTRAVENOUS
  Administered 2020-09-04: 6 mg/h via INTRAVENOUS
  Administered 2020-09-05: 08:00:00 5 mg/h via INTRAVENOUS
  Administered 2020-09-05 (×2): 6 mg/h via INTRAVENOUS
  Administered 2020-09-06: 7 mg/h via INTRAVENOUS
  Administered 2020-09-06: 6 mg/h via INTRAVENOUS
  Administered 2020-09-06: 4 mg/h via INTRAVENOUS
  Filled 2020-09-01 (×19): qty 50

## 2020-09-01 MED ORDER — FUROSEMIDE 10 MG/ML IJ SOLN
40.0000 mg | Freq: Once | INTRAMUSCULAR | Status: AC
  Administered 2020-09-01: 40 mg via INTRAVENOUS
  Filled 2020-09-01: qty 4

## 2020-09-01 MED ORDER — ENOXAPARIN SODIUM 100 MG/ML ~~LOC~~ SOLN
100.0000 mg | Freq: Two times a day (BID) | SUBCUTANEOUS | Status: DC
  Administered 2020-09-01 – 2020-09-13 (×24): 100 mg via SUBCUTANEOUS
  Filled 2020-09-01 (×24): qty 1

## 2020-09-01 NOTE — Progress Notes (Signed)
NAME:  Joseph Ayala, MRN:  [id number], DOB:  Oct 18, 1965, LOS: 4 ADMISSION DATE:  08/27/2020, CONSULTATION DATE:  08/28/20 REFERRING MD:  Darliss Cheney, MD CHIEF COMPLAINT:  Acute hypoxemic respiratory failure  Brief History:  55 year old male on Suboxone for necrotizing myopathy who presents with acute hypoxemic respiratory failure secondary to community-acquired pneumonia/aspiration. PCCM initially consulted for evaluation for bronchoscopy for possible mucous plugging. Transferred to ICU after worsening respiratory failure and mental status requiring intubation on 2/21.  History of Present Illness:  Joseph Ayala is a 55 year old male with history of necrotizing myopathy, prior history of opioid use disorder on Suboxone, hypertension who presented to the ED with cough, shortness of breath and chest pain.  He reports a 1 week history of productive cough, shortness of breath and associated substernal chest pain.  He is fully vaccinated against Covid including booster shot.  Admission Covid test negative  In the ED, he is afebrile but tachycardic and tachypneic.  O2 saturations were noted to be 88% on room air.  Admission chest x-ray with mild right lower lobe consolidation noted.  CTA obtained which was negative for PE however did show dense consolidation and groundglass opacities in the right middle and lower lobe as well as mild centrilobular emphysema.  Labs were significant for lactic acid 2.3 and BNP 114.  Otherwise blood counts and BMP overall unremarkable.  Troponin negative x2.  He was admitted to hospitalist service for acute hypoxemic respiratory failure/sepsis secondary to community-acquired pneumonia and started on antibiotics and 2 L oxygen.  Prior to shift change this morning he was found with O2 saturations in the 70s.  He was drowsy, tachypneic.  Rapid response was called and he was placed on nonrebreather.  Bedside CBG noted hypoglycemia to the 60s which improved with dextrose to 123.   EKG ordered which showed sinus tach.  Transfer to stepdown unit and pulmonary consulted to evaluate for possible mucous plug requiring bronchoscopy.  In the SDU he is drowsy but more alert on NRB. Now that is more interactive, he reports that his shortness of breath has improved compared to earlier this morning. Now is having severe abdominal and back pain that is new as well as feeling like his belly is tight  Past Medical History:  Hypertension, necrotizing myopathy, opioid use on Suboxone  Significant Hospital Events:  2/19 Arrived to ED for cough and chest pain x 1 week 2/20 Admitted to [facility] in early am. Intubated. Bronch 2/22 Worsening respiratory status and vasopressor requirement. Agitation on vent 2/23 Improving pressor requirement 2/24 Off pressors. Right chest tube placement pending for large pleural effusion Consults:  TRH PCCM  Procedures:    Significant Diagnostic Tests:  CTA - right sided consolidation and ground glass opacities in the right and middle lobe. Background centrilobular emphysema present. No pulmonary embolism  CT CAP 09/01/20 - Interval enlargement of right pleural effusion with some loculation. Progression of right airspace disease with cavitation in right middle/lower lobe. Anasarca Micro Data:  Blood culture 2/19>NGTD BAL resp cx 2/20> MRSA BAL fungal cx 2/20> BAL AFB cx 2/20> Urine strep ag 2/21>neg Urine legionella ag 2/21>neg Antimicrobials:  Ceftriaxone 2/20>2/21 Azithro 2/20>2/21 Vanc 2/21> Zosyn 2/21>2/23  Interim History / Subjective:  Afebrile and off pressors this morning.  CT C/A/P demonstrated lung abscess on the right side  Objective   Blood pressure 130/72, pulse 90, temperature 98.78 F (37.1 C), resp. rate (!) 8, height _0  (1.803 m), weight 109.6 kg, SpO2 100 %. CVP:  [  14 mmHg-17 mmHg] 17 mmHg  Vent Mode: PRVC FiO2 (%):  [30 %-50 %] 40 % Set Rate:  [24 bmp] 24 bmp Vt Set:  [600 mL] 600 mL PEEP:  [5 cmH20] 5  cmH20 Plateau Pressure:  [20 cmH20-23 cmH20] 21 cmH20   Intake/Output Summary (Last 24 hours) at 09/01/2020 0919 Last data filed at 09/01/2020 0800 Gross per 24 hour  Intake 3148.02 ml  Output 4725 ml  Net -1576.98 ml   Filed Weights   08/28/20 0214 08/31/20 0400 09/01/20 0352  Weight: 96.8 kg 109.3 kg 109.6 kg   Physical Exam: General: Critically ill-appearing, no acute distress HENT: [place], AT, OP clear, MMM Eyes: EOMI, no scleral icterus Respiratory: Clear to auscultation bilaterally.  No crackles, wheezing or rales Cardiovascular: RRR, -M/R/G, no JVD GI: BS+, firm, nontender Extremities:-Upper extremity edema, no lower extremity edema, mottling of pedal extremities that is unchanged, pedal pulses present Neuro: Sedated, grimaces to noxious stimuli, withdraws extremities x 4 Skin: Erythematous knees bilaterally GU: Foley in place   Resolved Hospital Problem list   Elevated LFTs AKI  Assessment & Plan:  Acute hypoxemic respiratory failure secondary to MRSA pneumonia/abscess and pleural effusion concerning for empyema Aspiration pneumonia/pneumonitis in setting of altered mental status Emphysema Rapid respiratory failure is likely due community acquired pneumonia exacerbated by aspiration pneumonitis escalating to dense consolidation with ARDS-like appearance limited to only right lung involvement.  --Full vent support. On minimal vent support --WUA/SBT daily. Hold on extubating in setting of acute illness --Right chest tube placement today for drainage of effusion --PAD protocol: Dilaudid gtt and PRN versed for RASS goal -2 --Continue Vanc. Stop Zosyn based on cultures --VAP  Septic shock secondary to pneumonia - off pressors Cardiogenic shock  --Continue Vanc --Off pressors. 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 Appreciate input --Continue amio gtt --Treatment dose lovenox. Held for procedure. Will resume after --Goal K >4 and Mg >2  Hypokalemia --Repleted  EtOH Abuse Last drink possibly yesterday vs a few days prior to admission per patient. He reports 9-10 beers daily. --Thiamine, folate, multivitamin --Sedation per PAD protocol  Necrotizing myopathy requiring suboxone Recently ran out of suboxone. Has taken OTC tylenol and ibuprofen for pain control --Hold suboxone while inpatient   Best practice (evaluated daily)  Diet: TF Pain/Anxiety/Delirium protocol (if indicated): Yes VAP protocol (if indicated): Yes DVT prophylaxis: Lovenox, treatment dose GI prophylaxis: Protonix Glucose control: CBG q4h Mobility: As tolerated Disposition: SDU  Goals of Care:  Last date of multidisciplinary goals of care discussion:  Family and staff present:  Summary of discussion:  Follow up goals of care discussion due: 2/28 Code Status: Full code. Confirmed on 2/20  Updated wife daily. Last updated 2/23  Labs   CBC: Recent Labs  Lab 08/29/20 0300 08/30/20 0449 08/30/20 2019 08/31/20 1231 09/01/20 0346  WBC 20.0* 16.7* 15.2* 14.1* 12.5*  NEUTROABS 18.7*  --   --   --   --   HGB 12.7* 10.0* 9.6* 10.5* 10.0*  HCT 37.2* 30.4* 28.4* 31.2* 30.0*  MCV 93.9 96.2 94.0 94.0 94.9  PLT 211 148* 142* 150 323    Basic Metabolic Panel: Recent Labs  Lab 08/29/20 0300 08/29/20 0841 08/30/20 0449 08/30/20 1309 08/30/20 1646 08/31/20 0348 08/31/20 1530 09/01/20 0346  NA 129*  130* 132* 130*  --  127*  --  134*  K 3.8 3.7 3.1* 3.8  --  3.5  --  3.4*  CL 99 100 104 100  --  98  --  102  CO2 18* 18* 20* 19*  --  18*  --  21*  GLUCOSE 79 97 151* 164*  --  134*  --  107*  BUN 38* 37* 29* 28*  --  26*  --  21*  CREATININE 1.26* 1.08 0.67 0.74  --  0.76  --   0.60*  CALCIUM 7.4* 7.7* 7.3* 7.9*  --  7.5*  --  7.9*  MG 1.8  --   --   --   --  2.4  --  2.4  PHOS  --   --   --  1.7* 1.6* 3.0 1.9*  --    GFR: Estimated Creatinine Clearance: 134.4 mL/min (A) (by C-G formula based on SCr of 0.6 mg/dL (L)). Recent Labs  Lab 08/30/20 0449 08/30/20 0749 08/30/20 1021 08/30/20 2019 08/31/20 1034 08/31/20 1231 09/01/20 0034 09/01/20 0346  WBC 16.7*  --   --  15.2*  --  14.1*  --  12.5*  LATICACIDVEN  --  2.0* 2.4*  --  2.0*  --  1.4  --     Liver Function Tests: Recent Labs  Lab 08/29/20 0300 08/29/20 0841 08/30/20 0749 08/31/20 0348 09/01/20 0346  AST 98* 251* 151* 99* 89*  ALT 69* 214* 195* 141* 131*  ALKPHOS 48 55 83 156* 285*  BILITOT 1.1 1.0 0.7 0.7 0.9  PROT 4.8* 4.9* 5.3* 4.9* 5.1*  ALBUMIN 2.3* 2.3* 2.3* 2.1* 2.0*   Recent Labs  Lab 08/28/20 1309  LIPASE 20  AMYLASE 18*   No results for input(s): AMMONIA in the last 168 hours.  ABG    Component Value Date/Time   PHART 7.322 (L) 08/28/2020 2300   PCO2ART 34.8 08/28/2020 2300   PO2ART 254 (H) 08/28/2020 2300   HCO3 17.1 (L) 08/28/2020 2300   ACIDBASEDEF 7.2 (H) 08/28/2020 2300   O2SAT 73.1 08/31/2020 1530     Coagulation Profile: Recent Labs  Lab 08/28/20 0418  INR 1.2    Cardiac Enzymes: No results for input(s): CKTOTAL, CKMB, CKMBINDEX, TROPONINI in the last 168 hours.  HbA1C: No results found for: HGBA1C  CBG: Recent Labs  Lab 08/31/20 1117 08/31/20 1601 08/31/20 1944 08/31/20 2352 09/01/20 0355  GLUCAP 120* 112* 97 76 103*    The patient is critically ill with multiple organ systems failure and requires high complexity decision making for assessment and support, frequent evaluation and titration of therapies, application of advanced monitoring technologies and extensive interpretation of multiple databases.  Independent Critical Care Time: 38 Minutes.   Rodman Pickle, M.D. [facility] Pulmonary/Critical Care Medicine 09/01/2020 9:19 AM   Please  see Amion for pager number to reach on-call Pulmonary and Critical Care Team.

## 2020-09-01 NOTE — Progress Notes (Signed)
K+ 3.4 Replaced per protocol  

## 2020-09-01 NOTE — Consult Note (Addendum)
Cardiology Consultation:   Patient ID: Joseph Ayala MRN: [id number]; DOB: [birth year]  Admit date: 08/27/2020 Date of Consult: 09/01/2020  PCP:  [name], MD   [place]  Cardiologist: New to Dr. Gardiner Rhyme Advanced Practice Provider:  No care team member to display Electrophysiologist:  None   Patient Profile:   Joseph Ayala is a 55 y.o. male with a hx of HTN, nectrotizing myopathy, opioid use disorder on suboxone, ETOH abuse, low testosterone who is being seen today for the evaluation of new LV dysfunction at the request of Dr. Loanne Drilling.  History of Present Illness:   Mr. Ayub has no prior cardiac history but does have the above history. Per H/P he has not used oxycodone for several years and takes suboxone but ran out several days prior to admission. He presented to the ED with right sided CP, cough and SOB. He was found to be tachycardic, tachypneic and hypoxic. Covid test was negative, reported to be vaccinated/boosted. CXR showed RLL PNA. CTA negative for PE but did show dense regions of mixed consolidative and groundglass opacity with septal thickening in the right middle and lower lobe along with ascending thoracic aortic aneurysm, coronary artery calcifications, and aortic atherosclerosis. He was initially placed on nasal cannula oxygen but then decompensated on 08/28/20 with worsening hypoxia and lethargy requiring intubation and bronch. He was felt to have septic shock requiring vasopressors and broadened antibiotics. Respiratory cultures c/w MRSA and PCCM has concern patient may need TEE at some point. Blood cx negative so far. CT chest/abd/pelvis yesterday showed increase to moderate-large pleural effusion partially loculated which may represent empyema, new small left pleural effusion, extensive progressive right airspace disease with progressive areas of cavitation compatible with necrotizing pneumonia, increased left sided consolidation,  body wall anasarca.   Hospitalization also notable for newly recognized atrial fibrillation (started on amiodarone and treatment dose Lovenox), elevated troponin felt due to demand ischemia (391->377), abnormal transaminases, anemia, hyponatremia, and lactic acidosis. 2D echo 08/30/20 showed EF 35-40%, global hypokinesis with moderately dilated LV, mildly reduced RV function with moderately enlarged RV, mildly elevated PASP, mildly dilated LA, moderately dilated RA, mild MR, mild calcification of aortic valve, mild-moderate dilation of ascending aorta, dilated IVC. Last CVP 17 earlier this AM. Pressors have been able to be weaned but remains clinically tenuous. His afib appeared to have been recognized 2/21 around 1pm; amiodarone started 2/22 around 839 and patient went back into NSR shortly after 11am on 2/22. Has been maintaining NSR since. Unable to obtain further hx from patient as he is intubated/sedated on vent.    Past Medical History:  Diagnosis Date  . Degenerative disc disease, lumbar   . Dental crown present   . ETOH abuse   . History of kidney stones   . Hypertension    states under control with meds., has been on med. x 1 yr. - is currently out of med., to see PCP 02/06/2018  . Muscle atrophy 01/2018  . Muscle weakness 01/2018  . Necrotizing myopathy   . Opioid use disorder   . Thoracic aortic aneurysm (TAA) ([place])    a. seen on CT 08/2020.    Past Surgical History:  Procedure Laterality Date  . ENDOVENOUS ABLATION SAPHENOUS VEIN W/ LASER Right 12-29-2015   endovenous laser ablation right greater saphenous vein, stab phlebectomy > 20 incisions right leg, sclerotherapy right leg by Curt Jews MD    . MINOR MUSCLE BIOPSY Right 02/11/2018   Procedure: RECTUS FEMORIS  MUSCLE BIOPSY;  Surgeon: Armandina Gemma, MD;  Location: [facility];  Service: General;  Laterality: Right;  . MUSCLE BIOPSY Right 02/11/2018   Procedure: DELTOID MUSCLE BIOPSY;  Surgeon: Armandina Gemma, MD;   Location: [place];  Service: General;  Laterality: Right;  . NASAL FRACTURE SURGERY       Home Medications:  Prior to Admission medications   Medication Sig Start Date End Date Taking? Authorizing Provider  Buprenorphine HCl-Naloxone HCl 8-2 MG FILM Take 2 and 1/2 films sublingual daily 06/20/20  Yes [name], MD  testosterone cypionate (DEPOTESTOSTERONE CYPIONATE) 200 MG/ML injection Inject 2 mLs (400 mg total) into the muscle every 14 (fourteen) days. Patient not taking: Reported on 08/28/2020 05/05/20   [name], MD    Inpatient Medications: Scheduled Meds: . chlorhexidine gluconate (MEDLINE KIT)  15 mL Mouth Rinse BID  . Chlorhexidine Gluconate Cloth  6 each Topical Daily  . docusate  100 mg Per Tube BID  . feeding supplement (PIVOT 1.5 CAL)  1,000 mL Per Tube Q24H  . folic acid  1 mg Per Tube Daily  . mouth rinse  15 mL Mouth Rinse 10 times per day  . multivitamin  15 mL Per Tube Daily  . mupirocin ointment  1 application Nasal BID  . pantoprazole (PROTONIX) IV  40 mg Intravenous Q24H  . polyethylene glycol  17 g Per Tube Daily  . sodium chloride flush  10-40 mL Intracatheter Q12H  . thiamine  100 mg Per Tube Daily   Continuous Infusions: . sodium chloride Stopped (08/31/20 2038)  . amiodarone 30 mg/hr (09/01/20 0629)  . dextrose 5% lactated ringers 75 mL/hr at 09/01/20 0629  . HYDROmorphone 2.5 mg/hr (09/01/20 0629)  . norepinephrine (LEVOPHED) Adult infusion Stopped (08/31/20 2157)  . propofol (DIPRIVAN) infusion 50 mcg/kg/min (09/01/20 0629)  . vancomycin Stopped (09/01/20 0252)  . vasopressin Stopped (08/31/20 2200)   PRN Meds: acetaminophen (TYLENOL) oral liquid 160 mg/5 mL **OR** acetaminophen, albuterol, HYDROmorphone, ibuprofen, midazolam, sodium chloride flush  Allergies:    Allergies  Allergen Reactions  . Vicodin [Hydrocodone-Acetaminophen] Nausea And Vomiting    Social History:   Social History    Socioeconomic History  . Marital status: Married    Spouse name: Not on file  . Number of children: 1  . Years of education: 25  . Highest education level: Some college, no degree  Occupational History  . Occupation: not working  Tobacco Use  . Smoking status: Never Smoker  . Smokeless tobacco: Former Network engineer  . Vaping Use: Never used  Substance and Sexual Activity  . Alcohol use: Yes    Comment: occasionally  . Drug use: No  . Sexual activity: Yes    Partners: Female  Other Topics Concern  . Not on file  Social History Narrative   Lives with wife and daughter in a one story home.  Has one child.  Currently not working.  Education: some college.    Social Determinants of Health   Financial Resource Strain: Not on file  Food Insecurity: Not on file  Transportation Needs: Not on file  Physical Activity: Not on file  Stress: Not on file  Social Connections: Not on file  Intimate Partner Violence: Not on file    Family History:   Family History  Problem Relation Age of Onset  . Varicose Veins Brother      ROS:  Unable to obtain as patient is intubated/sedated on vent  Physical Exam/Data:   Vitals:   09/01/20 0400 09/01/20 0500 09/01/20 0600 09/01/20 0743  BP:      Pulse:      Resp: (!) 24 (!) 24 (!) 8   Temp: 98.96 F (37.2 C) 98.78 F (37.1 C) 98.78 F (37.1 C)   TempSrc:      SpO2:    100%  Weight:      Height:        Intake/Output Summary (Last 24 hours) at 09/01/2020 0855 Last data filed at 09/01/2020 4765 Gross per 24 hour  Intake 3148.02 ml  Output 2325 ml  Net 823.02 ml   Last 3 Weights 09/01/2020 08/31/2020 08/28/2020  Weight (lbs) 241 lb 10 oz 240 lb 15.4 oz 213 lb 6.5 oz  Weight (kg) 109.6 kg 109.3 kg 96.8 kg     Body mass index is 33.7 kg/m.  General: Ill appearing WM in no acute distress on ventilator Head: Normocephalic, atraumatic, sclera non-icteric, no xanthomas, nares are without discharge. Neck: Negative for obvious  carotid bruits Lungs: Coarse mechanical BS throughout Heart: RRR S1 S2 without murmurs, rubs, or gallops.  Abdomen: Distended/rounded without guarding, +BS Extremities: Diffuse mild edema, mottling of pedal extremities which was previously noted, distal extremities from ankles down cool Neuro: Sedated, not agitated Psych: unable to assess  EKG:  The EKG was personally reviewed and demonstrates:   Multiple tracings reviewed Initial tracings show ST 108bpm, suspected LVH with repolarization abnormalities Most recently shows coarse atrial fibrillation (flutter-like appearance) 112bpm, LVH, with diffuse nonspecific STTW changes   Telemetry:  Telemetry was personally reviewed and demonstrates: maintaining NSR since 2/22  Relevant CV Studies: 2D echo 08/30/20  1. Left ventricular ejection fraction, by estimation, is 35 to 40%. The  left ventricle has moderately decreased function. The left ventricle  demonstrates global hypokinesis. The left ventricular internal cavity size  was moderately dilated. Left  ventricular diastolic parameters are indeterminate.  2. Right ventricular systolic function is mildly reduced. The right  ventricular size is moderately enlarged. There is mildly elevated  pulmonary artery systolic pressure.  3. Left atrial size was mildly dilated.  4. Right atrial size was moderately dilated.  5. The mitral valve is normal in structure. Mild mitral valve  regurgitation. No evidence of mitral stenosis.  6. The aortic valve has an indeterminant number of cusps. There is mild  calcification of the aortic valve. Aortic valve regurgitation is not  visualized. Mild aortic valve sclerosis is present, with no evidence of  aortic valve stenosis.  7. Aortic dilatation noted. There is mild to moderate dilatation of the  ascending aorta, measuring 44 mm.  8. The inferior vena cava is dilated in size with <50% respiratory  variability, suggesting right atrial pressure of 15  mmHg.   Comparison(s): No prior Echocardiogram.   Conclusion(s)/Recommendation(s): EF reduced, with global hypokinesis.  Thoracic aortic aneurysm, noted previously in chart. Findings communicated  with Dr. Loanne Drilling.   Laboratory Data:  High Sensitivity Troponin:   Recent Labs  Lab 08/27/20 1953 08/28/20 0704 08/30/20 1646 08/30/20 2326  TROPONINIHS 9 17 391* 377*     Chemistry Recent Labs  Lab 08/30/20 1309 08/31/20 0348 09/01/20 0346  NA 130* 127* 134*  K 3.8 3.5 3.4*  CL 100 98 102  CO2 19* 18* 21*  GLUCOSE 164* 134* 107*  BUN 28* 26* 21*  CREATININE 0.74 0.76 0.60*  CALCIUM 7.9* 7.5* 7.9*  GFRNONAA >60 >60 >60  ANIONGAP _0 Recent Labs  Lab 08/30/20 0749 08/31/20 0348 09/01/20 0346  PROT 5.3* 4.9* 5.1*  ALBUMIN 2.3* 2.1* 2.0*  AST 151* 99* 89*  ALT 195* 141* 131*  ALKPHOS 83 156* 285*  BILITOT 0.7 0.7 0.9   Hematology Recent Labs  Lab 08/30/20 2019 08/31/20 1231 09/01/20 0346  WBC 15.2* 14.1* 12.5*  RBC 3.02* 3.32* 3.16*  HGB 9.6* 10.5* 10.0*  HCT 28.4* 31.2* 30.0*  MCV 94.0 94.0 94.9  MCH 31.8 31.6 31.6  MCHC 33.8 33.7 33.3  RDW 15.0 14.9 15.0  PLT 142* 150 150   BNP Recent Labs  Lab 08/28/20 0704  BNP 114.2*    DDimer No results for input(s): DDIMER in the last 168 hours.   Radiology/Studies:  DG Abd 1 View  Result Date: 08/28/2020 CLINICAL DATA:  OG tube placement, intubation EXAM: PORTABLE CHEST - 1 VIEW ABDOMEN - 1 VIEW COMPARISON:  Radiograph 08/28/2020 FINDINGS: Endotracheal tube in the mid trachea 3.3 cm from the carina. Transesophageal tube tip and side port terminate below the margins of chest imaging, with the terminus on the abdominal radiograph in the region of the gastric body. Telemetry leads and external support devices overlie the chest and abdomen Some increasing gradient opacity is noted throughout the right hemithorax with some lateral pleural thickening suggestive of a developing pleural effusion. Persistent  basilar regions of consolidation compatible with the right middle and lower lobe airspace disease with bubbly lucencies related to underlying emphysematous change and associated air bronchograms, as seen on comparison CT imaging. Left lung remains largely clear aside from some atelectatic change and vascular crowding. No left effusion. No visible pneumothorax. Abdominal radiograph encompasses only portion of the upper abdomen with several air-filled loops albeit without high-grade obstructive pattern. No suspicious calcifications. No acute osseous abnormality or suspicious osseous lesion. IMPRESSION: 1. Endotracheal tube in the mid trachea 3.3 cm from the carina. 2. Transesophageal tube appropriately terminates in the region of the gastric body with side port distal to the GE junction. 3. Persistent right middle and lower lobe airspace disease. 4. Suspect developing right pleural effusion. Electronically Signed   By: Lovena Le M.D.   On: 08/28/2020 22:22   CT ABDOMEN PELVIS W CONTRAST  Result Date: 08/28/2020 CLINICAL DATA:  55 year old male with abdominal pain. EXAM: CT ABDOMEN AND PELVIS WITH CONTRAST TECHNIQUE: Multidetector CT imaging of the abdomen and pelvis was performed using the standard protocol following bolus administration of intravenous contrast. CONTRAST:  164m OMNIPAQUE IOHEXOL 300 MG/ML  SOLN COMPARISON:  CT abdomen pelvis dated 09/06/2008. FINDINGS: Lower chest: There is a partially visualized small to moderate right pleural effusion. There is a large area of consolidation involving the majority of the right lower and right middle lobes most consistent with pneumonia. Scattered small pockets of air within the consolidative area may represent bronchogram although developing cavitation is not excluded. There is subsegmental consolidation of the left lower lobe with air bronchogram. No intra-abdominal free air or free fluid. Hepatobiliary: Diffuse fatty liver. No intrahepatic biliary ductal  dilatation. The gallbladder is unremarkable. Pancreas: Unremarkable. No pancreatic ductal dilatation or surrounding inflammatory changes. Spleen: Normal in size without focal abnormality. Adrenals/Urinary Tract: The adrenal glands unremarkable. There is a 5 mm stone in the lower pole of the left kidney. A punctate nonobstructing left renal upper pole calculus. No hydronephrosis. The right kidney is unremarkable. There is symmetric enhancement and excretion of contrast by both kidneys. The visualized ureters and urinary bladder appear unremarkable. Stomach/Bowel: There is no bowel obstruction or active inflammation. The  appendix is not visualized with certainty. No inflammatory changes identified in the right lower quadrant. Vascular/Lymphatic: The abdominal aorta and IVC are unremarkable. No portal venous gas. There is no adenopathy. Reproductive: The prostate and seminal vesicles are grossly unremarkable. No pelvic mass. Other: Small fat containing left inguinal hernia. Musculoskeletal: Degenerative changes of the spine. No acute osseous pathology. IMPRESSION: 1. Large area of consolidation involving the majority of the right lower and right middle lobes most consistent with pneumonia. Underlying mass is not excluded. Clinical correlation and follow-up to resolution recommended. 2. Partially visualized small to moderate right pleural effusion. 3. Fatty liver. 4. Nonobstructing left renal calculi. No hydronephrosis. 5. No bowel obstruction. Electronically Signed   By: Anner Crete M.D.   On: 08/28/2020 16:57   DG CHEST PORT 1 VIEW  Result Date: 08/30/2020 CLINICAL DATA:  Hypoxia EXAM: PORTABLE CHEST 1 VIEW COMPARISON:  August 29, 2020 FINDINGS: Endotracheal tube tip is 4.3 cm above the carina. Nasogastric tube tip and side port are in the stomach. Central catheter tip is in the superior vena cava. No pneumothorax. There is pleural effusion on the right with suspected underlying foci of atelectasis and  airspace consolidation. There is focal atelectasis in the left base. Heart is mildly enlarged with pulmonary vascularity normal. No adenopathy. No bone lesions. IMPRESSION: Tube and catheter positions as described without evident pneumothorax. Layering pleural effusion on the right with atelectasis and potential underlying airspace opacity/infiltrates in the right lung. There is left base atelectasis. Left lung otherwise clear. Stable cardiac prominence. Electronically Signed   By: Lowella Grip III M.D.   On: 08/30/2020 08:06   DG CHEST PORT 1 VIEW  Result Date: 08/29/2020 CLINICAL DATA:  Central line placement EXAM: PORTABLE CHEST 1 VIEW COMPARISON:  08/28/2020, CT 08/27/2020 FINDINGS: Endotracheal tube tip terminates 3 cm from the carina. Transesophageal tube tip and side port distal to the GE junction. Telemetry leads overlie the chest. Interval placement a left IJ approach central venous catheter with the tip positioned near the left brachiocephalic-superior caval confluence. Suspect continued increase in the size of a layering right pleural effusion now with only minimal residually aerated lung in the right hemithorax. Underlying consolidative opacity is partially visible albeit with some superimposed atelectatic changes suspected as well. Left lung remains predominantly clear. Portion of the cardiomediastinal contours are now obscured by adjacent opacity though appear grossly similar to prior portable radiography. No acute osseous or soft tissue abnormality. IMPRESSION: 1. Interval placement of left IJ approach central venous catheter with the tip positioned near the left brachiocephalic-superior caval confluence. No pneumothorax. 2. Suspect continued increase in size of layering right pleural effusion now with only minimal residually aerated lung in the right hemithorax. Underlying airspace disease likely remains with some superimposed passive atelectatic changes. Electronically Signed   By: Lovena Le M.D.   On: 08/29/2020 02:25   DG CHEST PORT 1 VIEW  Result Date: 08/28/2020 CLINICAL DATA:  OG tube placement, intubation EXAM: PORTABLE CHEST - 1 VIEW ABDOMEN - 1 VIEW COMPARISON:  Radiograph 08/28/2020 FINDINGS: Endotracheal tube in the mid trachea 3.3 cm from the carina. Transesophageal tube tip and side port terminate below the margins of chest imaging, with the terminus on the abdominal radiograph in the region of the gastric body. Telemetry leads and external support devices overlie the chest and abdomen Some increasing gradient opacity is noted throughout the right hemithorax with some lateral pleural thickening suggestive of a developing pleural effusion. Persistent basilar regions of consolidation compatible with  the right middle and lower lobe airspace disease with bubbly lucencies related to underlying emphysematous change and associated air bronchograms, as seen on comparison CT imaging. Left lung remains largely clear aside from some atelectatic change and vascular crowding. No left effusion. No visible pneumothorax. Abdominal radiograph encompasses only portion of the upper abdomen with several air-filled loops albeit without high-grade obstructive pattern. No suspicious calcifications. No acute osseous abnormality or suspicious osseous lesion. IMPRESSION: 1. Endotracheal tube in the mid trachea 3.3 cm from the carina. 2. Transesophageal tube appropriately terminates in the region of the gastric body with side port distal to the GE junction. 3. Persistent right middle and lower lobe airspace disease. 4. Suspect developing right pleural effusion. Electronically Signed   By: Lovena Le M.D.   On: 08/28/2020 22:22   ECHOCARDIOGRAM COMPLETE  Result Date: 08/30/2020    ECHOCARDIOGRAM REPORT   Patient Name:   Joseph Ayala Date of Exam: 08/30/2020 Medical Rec #:  [id number]      Height:       71.0 in Accession #:    [id number]     Weight:       213.4 lb Date of Birth:  Nov 16, 1965      BSA:           2.168 m Patient Age:    58 years       BP:           130/72 mmHg Patient Gender: M              HR:           86 bpm. Exam Location:  Inpatient Procedure: 2D Echo, Cardiac Doppler and Color Doppler Indications:    Hypotension [119417]  History:        Patient has no prior history of Echocardiogram examinations.                 Risk Factors:Hypertension and Non-Smoker.  Sonographer:    Vickie Epley RDCS Referring Phys: [id number] [facility]  Sonographer Comments: Echo performed with patient supine and on artificial respirator. IMPRESSIONS  1. Left ventricular ejection fraction, by estimation, is 35 to 40%. The left ventricle has moderately decreased function. The left ventricle demonstrates global hypokinesis. The left ventricular internal cavity size was moderately dilated. Left ventricular diastolic parameters are indeterminate.  2. Right ventricular systolic function is mildly reduced. The right ventricular size is moderately enlarged. There is mildly elevated pulmonary artery systolic pressure.  3. Left atrial size was mildly dilated.  4. Right atrial size was moderately dilated.  5. The mitral valve is normal in structure. Mild mitral valve regurgitation. No evidence of mitral stenosis.  6. The aortic valve has an indeterminant number of cusps. There is mild calcification of the aortic valve. Aortic valve regurgitation is not visualized. Mild aortic valve sclerosis is present, with no evidence of aortic valve stenosis.  7. Aortic dilatation noted. There is mild to moderate dilatation of the ascending aorta, measuring 44 mm.  8. The inferior vena cava is dilated in size with <50% respiratory variability, suggesting right atrial pressure of 15 mmHg. Comparison(s): No prior Echocardiogram. Conclusion(s)/Recommendation(s): EF reduced, with global hypokinesis. Thoracic aortic aneurysm, noted previously in chart. Findings communicated with Dr. Loanne Drilling. FINDINGS  Left Ventricle: Left ventricular ejection  fraction, by estimation, is 35 to 40%. The left ventricle has moderately decreased function. The left ventricle demonstrates global hypokinesis. The left ventricular internal cavity size was moderately dilated. There is no left ventricular hypertrophy.  Left ventricular diastolic parameters are indeterminate. Right Ventricle: The right ventricular size is moderately enlarged. Right vetricular wall thickness was not well visualized. Right ventricular systolic function is mildly reduced. There is mildly elevated pulmonary artery systolic pressure. The tricuspid  regurgitant velocity is 2.32 m/s, and with an assumed right atrial pressure of 15 mmHg, the estimated right ventricular systolic pressure is 23.5 mmHg. Left Atrium: Left atrial size was mildly dilated. Right Atrium: Right atrial size was moderately dilated. Pericardium: There is no evidence of pericardial effusion. Mitral Valve: The mitral valve is normal in structure. Mild mitral valve regurgitation. No evidence of mitral valve stenosis. Tricuspid Valve: The tricuspid valve is normal in structure. Tricuspid valve regurgitation is mild . No evidence of tricuspid stenosis. Aortic Valve: The aortic valve has an indeterminant number of cusps. There is mild calcification of the aortic valve. Aortic valve regurgitation is not visualized. Mild aortic valve sclerosis is present, with no evidence of aortic valve stenosis. Pulmonic Valve: The pulmonic valve was not well visualized. Pulmonic valve regurgitation is not visualized. Aorta: Aortic dilatation noted. There is mild to moderate dilatation of the ascending aorta, measuring 44 mm. Venous: The inferior vena cava is dilated in size with less than 50% respiratory variability, suggesting right atrial pressure of 15 mmHg. IAS/Shunts: The atrial septum is grossly normal. Additional Comments: There is a small pleural effusion in both left and right lateral regions.  LEFT VENTRICLE PLAX 2D LVIDd:         6.00 cm       Diastology LVIDs:         4.80 cm      LV e' medial:    5.98 cm/s LV PW:         1.00 cm      LV E/e' medial:  6.3 LV IVS:        1.00 cm      LV e' lateral:   10.00 cm/s LVOT diam:     2.70 cm      LV E/e' lateral: 3.8 LV SV:         70 LV SV Index:   32 LVOT Area:     5.73 cm                              3D Volume EF: LV Volumes (MOD)            3D EF:        37 % LV vol d, MOD A2C: 203.0 ml LV EDV:       225 ml LV vol d, MOD A4C: 187.0 ml LV ESV:       142 ml LV vol s, MOD A2C: 116.0 ml LV SV:        83 ml LV vol s, MOD A4C: 133.0 ml LV SV MOD A2C:     87.0 ml LV SV MOD A4C:     187.0 ml LV SV MOD BP:      68.6 ml RIGHT VENTRICLE RV S prime:     11.10 cm/s TAPSE (M-mode): 1.5 cm LEFT ATRIUM             Index       RIGHT ATRIUM           Index LA diam:        4.30 cm 1.98 cm/m  RA Area:     22.70 cm LA Vol (A2C):   64.6  ml 29.80 ml/m RA Volume:   86.80 ml  40.05 ml/m LA Vol (A4C):   47.7 ml 22.01 ml/m LA Biplane Vol: 55.6 ml 25.65 ml/m  AORTIC VALVE LVOT Vmax:   92.90 cm/s LVOT Vmean:  58.100 cm/s LVOT VTI:    0.122 m  AORTA Ao Root diam: 3.80 cm Ao Asc diam:  4.40 cm MITRAL VALVE               TRICUSPID VALVE MV Area (PHT): 2.26 cm    TR Peak grad:   21.5 mmHg MV Decel Time: 335 msec    TR Vmax:        232.00 cm/s MR Peak grad: 64.0 mmHg MR Vmax:      400.00 cm/s  SHUNTS MV E velocity: 37.70 cm/s  Systemic VTI:  0.12 m MV A velocity: 31.50 cm/s  Systemic Diam: 2.70 cm MV E/A ratio:  1.20 Buford Dresser MD Electronically signed by Buford Dresser MD Signature Date/Time: 08/30/2020/4:10:42 PM    Final    VAS [place] LOWER EXTREMITY VENOUS (DVT)  Result Date: 08/30/2020  Lower Venous DVT Study Indications: Swelling.  Risk Factors: None identified. Comparison Study: No prior studies. Performing Technologist: Oliver Hum RVT  Examination Guidelines: A complete evaluation includes B-mode imaging, spectral Doppler, color Doppler, and power Doppler as needed of all accessible portions of each  vessel. Bilateral testing is considered an integral part of a complete examination. Limited examinations for reoccurring indications may be performed as noted. The reflux portion of the exam is performed with the patient in reverse Trendelenburg.  +---------+---------------+---------+-----------+----------+--------------+ RIGHT    CompressibilityPhasicitySpontaneityPropertiesThrombus Aging +---------+---------------+---------+-----------+----------+--------------+ CFV      Full           Yes      Yes                                 +---------+---------------+---------+-----------+----------+--------------+ SFJ      Full                                                        +---------+---------------+---------+-----------+----------+--------------+ FV Prox  Full                                                        +---------+---------------+---------+-----------+----------+--------------+ FV Mid   Full                                                        +---------+---------------+---------+-----------+----------+--------------+ FV DistalFull                                                        +---------+---------------+---------+-----------+----------+--------------+ PFV      Full                                                        +---------+---------------+---------+-----------+----------+--------------+  POP      Full           Yes      Yes                                 +---------+---------------+---------+-----------+----------+--------------+ PTV      Full                                                        +---------+---------------+---------+-----------+----------+--------------+ PERO     Full                                                        +---------+---------------+---------+-----------+----------+--------------+   +---------+---------------+---------+-----------+----------+--------------+ LEFT      CompressibilityPhasicitySpontaneityPropertiesThrombus Aging +---------+---------------+---------+-----------+----------+--------------+ CFV      Full           Yes      Yes                                 +---------+---------------+---------+-----------+----------+--------------+ SFJ      Full                                                        +---------+---------------+---------+-----------+----------+--------------+ FV Prox  Full                                                        +---------+---------------+---------+-----------+----------+--------------+ FV Mid   Full                                                        +---------+---------------+---------+-----------+----------+--------------+ FV DistalFull                                                        +---------+---------------+---------+-----------+----------+--------------+ PFV      Full                                                        +---------+---------------+---------+-----------+----------+--------------+ POP      Full           Yes      Yes                                 +---------+---------------+---------+-----------+----------+--------------+  PTV      Full                                                        +---------+---------------+---------+-----------+----------+--------------+ PERO     Full                                                        +---------+---------------+---------+-----------+----------+--------------+     Summary: RIGHT: - There is no evidence of deep vein thrombosis in the lower extremity.  - No cystic structure found in the popliteal fossa. - Triphasic waveforms are noted in the posterior tibial, peroneal, and anterior tibial arteries.  LEFT: - There is no evidence of deep vein thrombosis in the lower extremity.  - No cystic structure found in the popliteal fossa. - Triphasic waveforms are noted in the posterior tibial, peroneal,  and anterior tibial arteries.  *See table(s) above for measurements and observations. Electronically signed by Deitra Mayo MD on 08/30/2020 at 6:24:33 PM.    Final    CT CHEST ABDOMEN PELVIS WO CONTRAST  Result Date: 08/31/2020 CLINICAL DATA:  Hypoxia and hypoxemia. EXAM: CT CHEST, ABDOMEN AND PELVIS WITHOUT CONTRAST TECHNIQUE: Multidetector CT imaging of the chest, abdomen and pelvis was performed following the standard protocol without IV contrast. COMPARISON:  08/27/2020 FINDINGS: CT CHEST FINDINGS Cardiovascular: Heart size appears within normal limits. There is no pericardial effusion. Coronary artery calcifications. Left IJ catheter tip terminates in the proximal SVC. Mediastinum/Nodes: ET tube tip terminates above the carina. There is a nasogastric tube within the body of stomach. Normal appearance of the thyroid gland. The trachea appears patent and is midline. Normal appearance of the esophagus. No enlarged axillary, supraclavicular, or mediastinal adenopathy. Lungs/Pleura: Interval increase in volume of moderate to large right pleural effusion which appears partially loculated. Small left pleural effusion is new from the previous exam. Extensive progressive airspace consolidation within the right middle lobe and right lower lobe. Progressive cavitation within right middle lobe and posteromedial right lower lobe airspace disease. There is subpleural, plate-like atelectasis involving the posterior and basal right upper lobe. Dense airspace consolidation with air bronchograms identified within the left lower lobe appears increased from previous exam. Musculoskeletal: No chest wall mass or suspicious bone lesions identified. CT ABDOMEN PELVIS FINDINGS Hepatobiliary: No focal liver abnormality is seen. No gallstones, gallbladder wall thickening, or biliary dilatation. Pancreas: Unremarkable. No pancreatic ductal dilatation or surrounding inflammatory changes. Spleen: Normal in size without focal  abnormality. Adrenals/Urinary Tract: Normal adrenal glands. Calcification within the left mid kidney measures 5 mm. No mass or hydronephrosis identified bilaterally. Urinary bladder is collapsed around a Foley catheter. Stomach/Bowel: Stomach is nondistended. The appendix is not confidently identified. No bowel wall thickening, inflammation, or distension. Enteric contrast material is identified throughout the colon up to the rectum. Vascular/Lymphatic: No significant vascular findings are present. No enlarged abdominal or pelvic lymph nodes. Reproductive: Prostate is unremarkable. Other: No significant free fluid or fluid collections within the abdomen or pelvis. No signs of abscess. Fat containing left inguinal hernia is again noted. Progressive subcutaneous soft tissue stranding throughout the body wall compatible with anasarca. Musculoskeletal: Degenerative disc disease identified. No acute or  suspicious osseous findings IMPRESSION: 1. Interval increase in volume of moderate to large right pleural effusion which appears partially loculated and may reflect developing empyema. New small left pleural effusion. 2. Extensive progressive airspace consolidation within the right middle lobe and right lower lobe. Progressive areas of cavitation within the right middle lobe and posteromedial right lower lobe compatible with necrotizing pneumonia 3. Increase in size of left lower lobe airspace consolidation with air bronchograms. 4. No acute findings within the abdomen or pelvis. No signs of abscess. 5. Progressive subcutaneous soft tissue stranding throughout the body wall compatible with anasarca. 6. Nonobstructing left renal calculus. 7. Aortic atherosclerosis. Aortic Atherosclerosis (ICD10-I70.0). Electronically Signed   By: Kerby Moors M.D.   On: 08/31/2020 18:22    Assessment and Plan:   1. Acute hypoxemic respiratory failure secondary to MRSA PNA - other issues include sepsis previously requiring pressurs,  aspiration PNA/pneumonitis in setting of AMS - enlarging right pleural effusion/possible empyema with progressive PNA felt compatible with necrotizing pneumonia on CT - anasarca and mottling of distal lower extremities - PCCM managing critical illness - there was question of needing TEE this admission for MRSA PNA.  As blood cultures are negative and no significant valvular disease on TTE, no need for TEE   2. Newly recognized LV dysfunction/cardiomyopathy - etiology unclear at present time - could be related to stress of acute illness or ETOH, but cannot exclude underlying CAD - elevated troponin on admission of 391 is out of proportion to acute LV dysfunction so do not suspect this represented an ACS but agree more likely demand ischemia - too ill for invasive evaluation for exclusion of coronary disease at present time - blood pressure has been too soft to consider GDMT but continue to re-eval daily as he remains off pressors - judicious use of IV fluids - will defer volume management to primary team, some edema noted, CVP 17, consider gentle diuresis as BP tolerates  3. Paroxysmal atrial fibrillation - likely driven by acute medical illness, responded well to IV amiodarone and now maintaining NSR - holding anticoagulation for chest tube today, restart when able -(coronary/aortic ca++ on imaging) therefore anticoagulation typically warranted although remains unclear if this was solely an isolated arrhythmia in the context of the above - this cohort of patients typically has high likelihood of reversion to atrial fib while critically ill, so would continue amiodarone/anticoagulation for now - CHADSVASC 3 for LV dysfunction, HTN, and vascular disease  4. Thoracic aortic aneurysm - 4.6cm on CT 08/27/20, to consider semi-annual follow-up imaging if appropriate  Risk Assessment/Risk Scores:       New York Heart Association (NYHA) Functional Class - patient intubated/sedated unable to  assess   CHA2DS2-VASc Score = 3  This indicates a 3.2% annual risk of stroke. The patient's score is based upon: CHF History: Yes HTN History: Yes Diabetes History: No Stroke History: No Vascular Disease History: Yes Age Score: 0 Gender Score: 0      For questions or updates, please contact [place] Please consult [email or web address] for contact info under    Signed, Charlie Pitter, PA-C  09/01/2020 8:55 AM   Patient seen and examined.  Agree with above documentation.  Joseph Ayala is a 55 year old male with history of hypertension, opioid use disorder, necrotizing myopathy, alcohol abuse who we are consulted by Dr. Ebony Hail for evaluation of new systolic heart failure.  No known cardiac history.  He presented to ED on 2/19 with shortness of breath and cough.  Found  to be hypoxic, chest x-ray showed right lower lobe pneumonia.  CTPA showed no evidence of PE but dense area of consolidation and groundglass opacity in right middle and lower lobes.  He developed worsening hypoxia and lethargy requiring intubation on 2/20.  Was found to have septic shock requiring pressor support.  Blood cultures have been negative.  Respiratory cultures positive for MRSA pneumonia.  CT chest abdomen pelvis yesterday showed moderate to large pleural effusion that appeared partially loculated, progressive right airspace disease with areas of cavitation concerning for necrotizing pneumonia.  His hospital course is also been complicated by atrial fibrillation, which is a new diagnosis.  He was noted to have troponin elevation to 391.  Echocardiogram on 2/22 showed EF 35 to 40%, mild RV dysfunction.  He has been on amiodarone for his atrial fibrillation and converted to normal sinus rhythm since 2/22.  On exam, patient is intubated, sedated, regular rate and rhythm, no murmurs, coarse breath sounds, trace lower extremity edema, mottled lower extremities.  For his atrial fibrillation, he is likely to have recurrence as  he recovers from his acute illness.  Would continue amiodarone as he recovers to maintain sinus rhythm.  Would continue IV for now, will plan to transition to p.o. for short course as he recovers.  His Lovenox was held due to chest tube placement today, would restart anticoagulation once able.  For his MRSA pneumonia, there was question of whether TEE would be needed.  Given no bacteremia and no significant valvular abnormalities on TTE, does not need TEE.  For his shock, could be mixed picture with both septic and cardiac components, but suspect predominantly septic shock.  Coox 73% yesterday, which argues against cardiogenic shock.  Suspect his LV dysfunction is due to his acute illness, though alcohol induced cardiomyopathy or ischemic cardiomyopathy remain on differential.  Will need reevaluation of systolic function as he recovers.  Hold off on GDMT given soft pressures, was requiring pressors yesterday but currently off.  Can add heart failure meds as he recovers.  CRITICAL CARE TIME: I have spent a total of 40 minutes with patient reviewing hospital notes, telemetry, EKGs, labs and examining the patient as well as establishing an assessment and plan that was discussed with the patient.  > 50% of time was spent in direct patient care. The patient is critically ill with multi-organ system failure and requires high complexity decision making for assessment and support, frequent evaluation and titration of therapies, application of advanced monitoring technologies and extensive interpretation of multiple databases.   Donato Heinz, MD

## 2020-09-01 NOTE — Procedures (Signed)
Insertion of Chest Tube Procedure Note  Joseph Ayala  [id number]  03-20-1966  Date:09/01/20  Time:10:12 AM    Provider Performing: Shelby Mattocks   Procedure: Chest Tube Insertion (32551)  Indication(s) Effusion  Attempted small bore. Had to abort mid procedure as pt was unable to tolerate left lateral position (desaturated, ETT obstructed w/ blood). At that point decided safest approach was mid axillary line lateral larger bore chest tube for maximum drainage)  Anesthesia Topical only with 1% lidocaine    Time Out Verified patient identification, verified procedure, site/side was marked, verified correct patient position, special equipment/implants available, medications/allergies/relevant history reviewed, required imaging and test results available.   Sterile Technique Maximal sterile technique including full sterile barrier drape, hand hygiene, sterile gown, sterile gloves, mask, hair covering, sterile ultrasound probe cover (if used).   Procedure Description Ultrasound used to identify appropriate pleural anatomy for placement and overlying skin marked. Area of placement cleaned and draped in sterile fashion.  A 28 French chest tube was placed into the right pleural space using Kelly dissection. Appropriate return of fluid was obtained.  The tube was connected to atrium and placed on -20 cm H2O wall suction.   Complications/Tolerance None; patient tolerated the procedure well. Chest X-ray is ordered to verify placement.   EBL Minimal  Specimen(s) fluid FOR CULTURE   Simonne Martinet ACNP-BC [facility] Pulmonary/Critical Care Pager # [phone] OR # [phone] if no answer

## 2020-09-01 NOTE — Progress Notes (Signed)
Pharmacy Antibiotic Note  Joseph Ayala is a 55 y.o. male admitted on 08/27/2020 with pneumonia.  Pharmacy has been consulted for vancomycin dosing. 09/01/2020  D#6 abx 2/23 CT: necrotizing PNA, possible empyema Tm 101.7, 100.4 currently, WBC 12.5  down trending, SCr WNL 2/24 s/p chest tube for large pleural effusion   Plan: Increase vancomycin to 2 gm IV q12 for AUC 505, Cmax 27.7/Cmin 15.4 F/u renal function, WBC, temp, culture data Vancomycin levels as needed  Height: _0  (180.3 cm) Weight: 109.6 kg (241 lb 10 oz) IBW/kg (Calculated) : 75.3  Temp (24hrs), Avg:100.3 F (37.9 C), Min:98.78 F (37.1 C), Max:101.66 F (38.7 C)  Recent Labs  Lab 08/29/20 0300 08/29/20 0841 08/29/20 1045 08/30/20 0449 08/30/20 0749 08/30/20 1021 08/30/20 1309 08/30/20 2019 08/31/20 0348 08/31/20 1034 08/31/20 1231 09/01/20 0034 09/01/20 0346 09/01/20 1211  WBC 20.0*  --   --  16.7*  --   --   --  15.2*  --   --  14.1*  --  12.5*  --   CREATININE 1.26*   < >  --  0.67  --   --  0.74  --  0.76  --   --   --  0.60* 0.45*  LATICACIDVEN  --   --  2.1*  --  2.0* 2.4*  --   --   --  2.0*  --  1.4  --   --   VANCOTROUGH  --   --   --   --   --   --   --   --   --   --   --   --   --  14*  VANCOPEAK  --   --   --   --   --   --   --   --   --   --   --   --  23*  --    < > = values in this interval not displayed.    Estimated Creatinine Clearance: 134.4 mL/min (A) (by C-G formula based on SCr of 0.45 mg/dL (L)).    Allergies  Allergen Reactions  . Vicodin [Hydrocodone-Acetaminophen] Nausea And Vomiting   Antimicrobials this admission:  2/20 CTX >>2/20 2/20 Azithro >>2/23 2/20 vanc>> 2/20 zosyn>>2/23 Dose adjustments this admission:  2/24 VT 14, VP 23: AUC 443, Cmax 24.3, Cmin 13.4 on 1750 q12. Incr 2 gm q12, AUC 505. 27.7/15.4 Microbiology results:  2/19 SARS CoV2: negative 2/20 Acid Fast Culture (BAL): neg x1 2/20 BAL: few MRSA (vanc MIC < 0.5) 2/20 MRSA PCR: positive 2/20 BCx:  ngtd 2/21 Strep pneumo UrAg: neg 2/21 legionella neg   Thank you for allowing pharmacy to be a part of this patient's care.  Eudelia Bunch, Pharm.D 09/01/2020 2:16 PM

## 2020-09-01 NOTE — Progress Notes (Signed)
eLink Physician-Brief Progress Note Patient Name: Joseph Ayala DOB: [birth year] MRN: [id number]   Date of Service  09/01/2020  HPI/Events of Note  Patient's enteral nutrition is on hold and there are concerns about potential for hypoglycemia.  eICU Interventions  D 5 % LR ordered @ 75 ml / hour.        Thomasene Lot Othmar Ringer 09/01/2020, 12:24 AM

## 2020-09-02 ENCOUNTER — Inpatient Hospital Stay (HOSPITAL_COMMUNITY): Payer: Medicare HMO

## 2020-09-02 DIAGNOSIS — I480 Paroxysmal atrial fibrillation: Secondary | ICD-10-CM

## 2020-09-02 DIAGNOSIS — J9601 Acute respiratory failure with hypoxia: Secondary | ICD-10-CM | POA: Diagnosis not present

## 2020-09-02 DIAGNOSIS — Z4682 Encounter for fitting and adjustment of non-vascular catheter: Secondary | ICD-10-CM

## 2020-09-02 DIAGNOSIS — F1190 Opioid use, unspecified, uncomplicated: Secondary | ICD-10-CM | POA: Diagnosis not present

## 2020-09-02 DIAGNOSIS — I5041 Acute combined systolic (congestive) and diastolic (congestive) heart failure: Secondary | ICD-10-CM | POA: Diagnosis not present

## 2020-09-02 LAB — CBC
HCT: 34.9 % — ABNORMAL LOW (ref 39.0–52.0)
Hemoglobin: 11.3 g/dL — ABNORMAL LOW (ref 13.0–17.0)
MCH: 31.5 pg (ref 26.0–34.0)
MCHC: 32.4 g/dL (ref 30.0–36.0)
MCV: 97.2 fL (ref 80.0–100.0)
Platelets: 177 10*3/uL (ref 150–400)
RBC: 3.59 MIL/uL — ABNORMAL LOW (ref 4.22–5.81)
RDW: 14.9 % (ref 11.5–15.5)
WBC: 14.5 10*3/uL — ABNORMAL HIGH (ref 4.0–10.5)
nRBC: 0.3 % — ABNORMAL HIGH (ref 0.0–0.2)

## 2020-09-02 LAB — COMPREHENSIVE METABOLIC PANEL
ALT: 129 U/L — ABNORMAL HIGH (ref 0–44)
AST: 96 U/L — ABNORMAL HIGH (ref 15–41)
Albumin: 2.1 g/dL — ABNORMAL LOW (ref 3.5–5.0)
Alkaline Phosphatase: 303 U/L — ABNORMAL HIGH (ref 38–126)
Anion gap: 9 (ref 5–15)
BUN: 16 mg/dL (ref 6–20)
CO2: 26 mmol/L (ref 22–32)
Calcium: 8.3 mg/dL — ABNORMAL LOW (ref 8.9–10.3)
Chloride: 108 mmol/L (ref 98–111)
Creatinine, Ser: 0.51 mg/dL — ABNORMAL LOW (ref 0.61–1.24)
GFR, Estimated: >60 mL/min (ref >60)
Glucose, Bld: 130 mg/dL — ABNORMAL HIGH (ref 70–99)
Potassium: 3.8 mmol/L (ref 3.5–5.1)
Sodium: 143 mmol/L (ref 135–145)
Total Bilirubin: 1.2 mg/dL (ref 0.3–1.2)
Total Protein: 5.6 g/dL — ABNORMAL LOW (ref 6.5–8.1)

## 2020-09-02 LAB — GLUCOSE, CAPILLARY
Glucose-Capillary: 110 mg/dL — ABNORMAL HIGH (ref 70–99)
Glucose-Capillary: 70 mg/dL (ref 70–99)
Glucose-Capillary: 80 mg/dL (ref 70–99)
Glucose-Capillary: 88 mg/dL (ref 70–99)
Glucose-Capillary: 89 mg/dL (ref 70–99)
Glucose-Capillary: 95 mg/dL (ref 70–99)

## 2020-09-02 LAB — MAGNESIUM: Magnesium: 2.4 mg/dL (ref 1.7–2.4)

## 2020-09-02 LAB — TSH: TSH: 3.379 u[IU]/mL (ref 0.350–4.500)

## 2020-09-02 LAB — CULTURE, BLOOD (ROUTINE X 2)
Culture: NO GROWTH
Culture: NO GROWTH

## 2020-09-02 IMAGING — DX DG CHEST 1V PORT
1 series · 1 of 1 positions shown · non-contrast
Comparison: [DATE] portable chest. CT Chest, Abdomen, and
Pelvis [DATE]

CLINICAL DATA: 55-year-old male admitted with community-acquired
pneumonia, sepsis.

EXAM:
PORTABLE CHEST 1 VIEW

[chest ap]
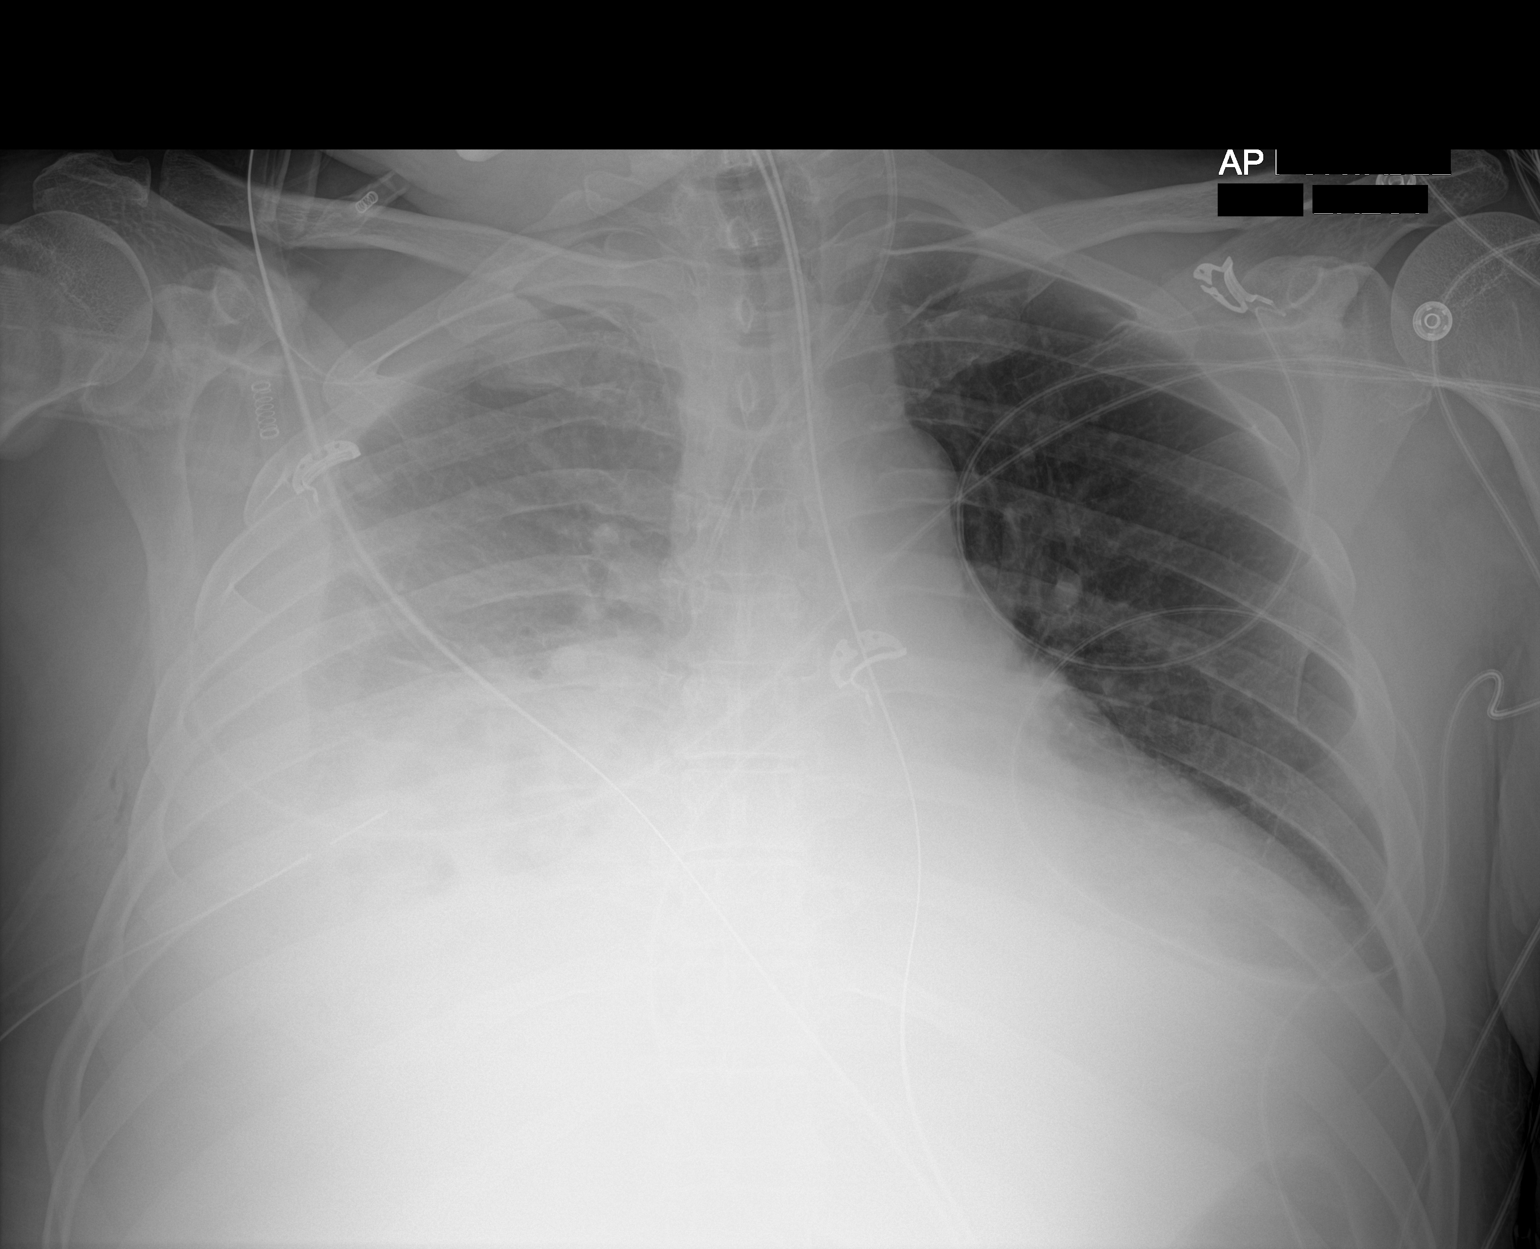

[1 of 1 positions shown; findings below may reference images not displayed]

FINDINGS: Portable AP semi upright view at [34] hours. Stable lines and tubes,
including right side chest tube. Reduced but not resolved right
pleural effusion which was large 2 days ago. No pneumothorax
identified. Superimposed partially cavitary airspace disease at the
right lung base, necrotizing pneumonia favored. Mediastinal contours
remain normal. Left lung appears stable with less pronounced lower
lobe collapse or consolidation. Paucity of bowel gas in the upper
abdomen. Stable visualized osseous structures.
IMPRESSION: 1. Stable lines and tubes. No pneumothorax.
2. Reduced but not resolved right pleural effusion with superimposed
necrotizing right lung base pneumonia.
3. Stable left lower lobe collapse or consolidation.

## 2020-09-02 MED ORDER — STERILE WATER FOR INJECTION IJ SOLN
5.0000 mg | Freq: Once | RESPIRATORY_TRACT | Status: AC
  Administered 2020-09-02: 5 mg via INTRAPLEURAL
  Filled 2020-09-02: qty 5

## 2020-09-02 MED ORDER — CHLORDIAZEPOXIDE HCL 25 MG PO CAPS
25.0000 mg | ORAL_CAPSULE | Freq: Four times a day (QID) | ORAL | Status: DC
  Administered 2020-09-02 – 2020-09-06 (×20): 25 mg
  Filled 2020-09-02 (×20): qty 1

## 2020-09-02 MED ORDER — POTASSIUM CHLORIDE 20 MEQ PO PACK
40.0000 meq | PACK | Freq: Two times a day (BID) | ORAL | Status: DC
  Administered 2020-09-02: 40 meq via ORAL
  Filled 2020-09-02: qty 2

## 2020-09-02 MED ORDER — MAGNESIUM SULFATE 2 GM/50ML IV SOLN
2.0000 g | Freq: Once | INTRAVENOUS | Status: AC
  Administered 2020-09-02: 2 g via INTRAVENOUS
  Filled 2020-09-02: qty 50

## 2020-09-02 MED ORDER — POTASSIUM CHLORIDE 20 MEQ PO PACK
40.0000 meq | PACK | Freq: Two times a day (BID) | ORAL | Status: AC
  Administered 2020-09-02: 40 meq
  Filled 2020-09-02: qty 2

## 2020-09-02 MED ORDER — SODIUM CHLORIDE (PF) 0.9 % IJ SOLN
10.0000 mg | Freq: Once | INTRAMUSCULAR | Status: AC
  Administered 2020-09-02: 10 mg via INTRAPLEURAL
  Filled 2020-09-02: qty 10

## 2020-09-02 MED ORDER — FUROSEMIDE 10 MG/ML IJ SOLN
40.0000 mg | Freq: Once | INTRAMUSCULAR | Status: AC
  Administered 2020-09-02: 40 mg via INTRAVENOUS
  Filled 2020-09-02: qty 4

## 2020-09-02 NOTE — TOC Progression Note (Signed)
Transition of Care [facility]) - Progression Note    Patient Details  Name: Joseph Ayala MRN: [id number] Date of Birth: 02-16-1966  Transition of Care [facility]) CM/SW Contact  Golda Acre, RN Phone Number: 09/02/2020, 8:19 AM  Clinical Narrative:    Significant Hospital Events:  2/19 Arrived to ED for cough and chest pain x 1 week 2/20 Admitted to [facility] in early am. Intubated. Bronch 2/22 Worsening respiratory status and vasopressor requirement. Agitation on vent 2/23 Improving pressor requirement 2/24 Off pressors. Right chest tube placement pending for large pleural effusion Following for toc needs   Expected Discharge Plan: Home/Self Care Barriers to Discharge: Continued Medical Work up  Expected Discharge Plan and Services Expected Discharge Plan: Home/Self Care   Discharge Planning Services: CM Consult   Living arrangements for the past 2 months: Single Family Home                                       Social Determinants of Health (SDOH) Interventions    Readmission Risk Interventions No flowsheet data found.

## 2020-09-02 NOTE — Progress Notes (Addendum)
Progress Note  Patient Name: Joseph Ayala Date of Encounter: 09/02/2020  [facility] Cardiologist: Donato Heinz, MD   Subjective   Resting, low grade fever, vent  sedated  Inpatient Medications    Scheduled Meds: . chlordiazePOXIDE  25 mg Per Tube QID  . chlorhexidine gluconate (MEDLINE KIT)  15 mL Mouth Rinse BID  . Chlorhexidine Gluconate Cloth  6 each Topical Daily  . docusate  100 mg Per Tube BID  . enoxaparin (LOVENOX) injection  100 mg Subcutaneous Q12H  . feeding supplement (PIVOT 1.5 CAL)  1,000 mL Per Tube Q24H  . folic acid  1 mg Per Tube Daily  . mouth rinse  15 mL Mouth Rinse 10 times per day  . midazolam  1 mg Intravenous Once  . multivitamin  15 mL Per Tube Daily  . mupirocin ointment  1 application Nasal BID  . pantoprazole sodium  40 mg Per Tube Daily  . polyethylene glycol  17 g Per Tube Daily  . potassium chloride  40 mEq Per Tube BID  . sodium chloride flush  10-40 mL Intracatheter Q12H  . thiamine  100 mg Per Tube Daily   Continuous Infusions: . sodium chloride Stopped (08/31/20 2038)  . amiodarone 30 mg/hr (09/02/20 0700)  . dextrose 5% lactated ringers 75 mL/hr at 09/02/20 0700  . HYDROmorphone 3 mg/hr (09/02/20 0700)  . midazolam 9 mg/hr (09/02/20 0945)  . vancomycin Stopped (09/02/20 0218)   PRN Meds: acetaminophen (TYLENOL) oral liquid 160 mg/5 mL **OR** acetaminophen, albuterol, HYDROmorphone, ibuprofen, midazolam, sodium chloride flush   Vital Signs    Vitals:   09/02/20 0600 09/02/20 0700 09/02/20 0733 09/02/20 0800  BP: 128/77 131/78  118/69  Pulse: 94 90  86  Resp: (!) 24 (!) 24  (!) 24  Temp: (!) 100.58 F (38.1 C) 100.04 F (37.8 C)  100.04 F (37.8 C)  TempSrc: Bladder   Bladder  SpO2: 97% 100% 100% 100%  Weight:      Height:        Intake/Output Summary (Last 24 hours) at 09/02/2020 1036 Last data filed at 09/02/2020 0700 Gross per 24 hour  Intake 3382.79 ml  Output 3940 ml  Net -557.21 ml   Last 3  Weights 09/02/2020 09/01/2020 08/31/2020  Weight (lbs) 225 lb 5 oz 241 lb 10 oz 240 lb 15.4 oz  Weight (kg) 102.2 kg 109.6 kg 109.3 kg      Telemetry    SR - Personally Reviewed  ECG    No new - Personally Reviewed  Physical Exam  General: Ill appearing WM in no acute distress on ventilator Head: Normocephalic, atraumatic Neck: Negative for obvious carotid bruits Lungs: Coarse mechanical BS throughout Heart: RRR S1 S2 without murmurs, rubs, or gallops.  Abdomen: Distended/rounded without guarding Extremities: Diffuse mild edema, mottling of pedal extremities which was previously noted, distal extremities warm  Neuro: Sedated, not agitated Psych: unable to assess   Labs    High Sensitivity Troponin:   Recent Labs  Lab 08/27/20 1953 08/28/20 0704 08/30/20 1646 08/30/20 2326  TROPONINIHS 9 17 391* 377*      Chemistry Recent Labs  Lab 08/31/20 0348 09/01/20 0346 09/01/20 1211 09/02/20 0549  NA 127* 134* 139 143  K 3.5 3.4* 3.6 3.8  CL 98 102 107 108  CO2 18* 21* 22 26  GLUCOSE 134* 107* 113* 130*  BUN 26* 21* 14 16  CREATININE 0.76 0.60* 0.45* 0.51*  CALCIUM 7.5* 7.9* 8.2* 8.3*  PROT  4.9* 5.1*  --  5.6*  ALBUMIN 2.1* 2.0*  --  2.1*  AST 99* 89*  --  96*  ALT 141* 131*  --  129*  ALKPHOS 156* 285*  --  303*  BILITOT 0.7 0.9  --  1.2  GFRNONAA >60 >60 >60 >60  ANIONGAP '11 11 10 9     ' Hematology Recent Labs  Lab 08/31/20 1231 09/01/20 0346 09/02/20 0549  WBC 14.1* 12.5* 14.5*  RBC 3.32* 3.16* 3.59*  HGB 10.5* 10.0* 11.3*  HCT 31.2* 30.0* 34.9*  MCV 94.0 94.9 97.2  MCH 31.6 31.6 31.5  MCHC 33.7 33.3 32.4  RDW 14.9 15.0 14.9  PLT 150 150 177    BNP Recent Labs  Lab 08/28/20 0704  BNP 114.2*     DDimer No results for input(s): DDIMER in the last 168 hours.   Radiology    DG Chest Port 1 View  Result Date: 09/01/2020 CLINICAL DATA:  Chest tube placement. EXAM: PORTABLE CHEST 1 VIEW COMPARISON:  Chest radiographs 08/30/2020 and CT  08/31/2020 FINDINGS: An endotracheal tube terminates approximately 3.5 cm above the carina. A left jugular catheter terminates over the upper SVC. An enteric tube courses into the abdomen with tip not imaged. A right chest tube has been placed and terminates over the lower lung. There is evidence of a moderate residual right pleural effusion which is likely partially loculated laterally. Extensive right lung airspace consolidation is similar to the prior radiographs with multiple small lucencies in the right lung base corresponding to cavitation on CT. Left basilar lung consolidation also has not significantly changed. No pneumothorax is identified. IMPRESSION: 1. Interval right chest tube placement. Moderate residual right pleural effusion. No pneumothorax. 2. Unchanged right greater than left lung consolidation consistent with pneumonia with cavitation in the right lung base. Electronically Signed   By: Logan Bores M.D.   On: 09/01/2020 11:01   CT CHEST ABDOMEN PELVIS WO CONTRAST  Result Date: 08/31/2020 CLINICAL DATA:  Hypoxia and hypoxemia. EXAM: CT CHEST, ABDOMEN AND PELVIS WITHOUT CONTRAST TECHNIQUE: Multidetector CT imaging of the chest, abdomen and pelvis was performed following the standard protocol without IV contrast. COMPARISON:  08/27/2020 FINDINGS: CT CHEST FINDINGS Cardiovascular: Heart size appears within normal limits. There is no pericardial effusion. Coronary artery calcifications. Left IJ catheter tip terminates in the proximal SVC. Mediastinum/Nodes: ET tube tip terminates above the carina. There is a nasogastric tube within the body of stomach. Normal appearance of the thyroid gland. The trachea appears patent and is midline. Normal appearance of the esophagus. No enlarged axillary, supraclavicular, or mediastinal adenopathy. Lungs/Pleura: Interval increase in volume of moderate to large right pleural effusion which appears partially loculated. Small left pleural effusion is new from the  previous exam. Extensive progressive airspace consolidation within the right middle lobe and right lower lobe. Progressive cavitation within right middle lobe and posteromedial right lower lobe airspace disease. There is subpleural, plate-like atelectasis involving the posterior and basal right upper lobe. Dense airspace consolidation with air bronchograms identified within the left lower lobe appears increased from previous exam. Musculoskeletal: No chest wall mass or suspicious bone lesions identified. CT ABDOMEN PELVIS FINDINGS Hepatobiliary: No focal liver abnormality is seen. No gallstones, gallbladder wall thickening, or biliary dilatation. Pancreas: Unremarkable. No pancreatic ductal dilatation or surrounding inflammatory changes. Spleen: Normal in size without focal abnormality. Adrenals/Urinary Tract: Normal adrenal glands. Calcification within the left mid kidney measures 5 mm. No mass or hydronephrosis identified bilaterally. Urinary bladder is collapsed around a Foley  catheter. Stomach/Bowel: Stomach is nondistended. The appendix is not confidently identified. No bowel wall thickening, inflammation, or distension. Enteric contrast material is identified throughout the colon up to the rectum. Vascular/Lymphatic: No significant vascular findings are present. No enlarged abdominal or pelvic lymph nodes. Reproductive: Prostate is unremarkable. Other: No significant free fluid or fluid collections within the abdomen or pelvis. No signs of abscess. Fat containing left inguinal hernia is again noted. Progressive subcutaneous soft tissue stranding throughout the body wall compatible with anasarca. Musculoskeletal: Degenerative disc disease identified. No acute or suspicious osseous findings IMPRESSION: 1. Interval increase in volume of moderate to large right pleural effusion which appears partially loculated and may reflect developing empyema. New small left pleural effusion. 2. Extensive progressive airspace  consolidation within the right middle lobe and right lower lobe. Progressive areas of cavitation within the right middle lobe and posteromedial right lower lobe compatible with necrotizing pneumonia 3. Increase in size of left lower lobe airspace consolidation with air bronchograms. 4. No acute findings within the abdomen or pelvis. No signs of abscess. 5. Progressive subcutaneous soft tissue stranding throughout the body wall compatible with anasarca. 6. Nonobstructing left renal calculus. 7. Aortic atherosclerosis. Aortic Atherosclerosis (ICD10-I70.0). Electronically Signed   By: Kerby Moors M.D.   On: 08/31/2020 18:22    Cardiac Studies   Echo 08/30/20 IMPRESSIONS    1. Left ventricular ejection fraction, by estimation, is 35 to 40%. The  left ventricle has moderately decreased function. The left ventricle  demonstrates global hypokinesis. The left ventricular internal cavity size  was moderately dilated. Left  ventricular diastolic parameters are indeterminate.  2. Right ventricular systolic function is mildly reduced. The right  ventricular size is moderately enlarged. There is mildly elevated  pulmonary artery systolic pressure.  3. Left atrial size was mildly dilated.  4. Right atrial size was moderately dilated.  5. The mitral valve is normal in structure. Mild mitral valve  regurgitation. No evidence of mitral stenosis.  6. The aortic valve has an indeterminant number of cusps. There is mild  calcification of the aortic valve. Aortic valve regurgitation is not  visualized. Mild aortic valve sclerosis is present, with no evidence of  aortic valve stenosis.  7. Aortic dilatation noted. There is mild to moderate dilatation of the  ascending aorta, measuring 44 mm.  8. The inferior vena cava is dilated in size with <50% respiratory  variability, suggesting right atrial pressure of 15 mmHg.   Comparison(s): No prior Echocardiogram.   Conclusion(s)/Recommendation(s): EF  reduced, with global hypokinesis.  Thoracic aortic aneurysm, noted previously in chart. Findings communicated  with Dr. Loanne Drilling.   FINDINGS  Left Ventricle: Left ventricular ejection fraction, by estimation, is 35  to 40%. The left ventricle has moderately decreased function. The left  ventricle demonstrates global hypokinesis. The left ventricular internal  cavity size was moderately dilated.  There is no left ventricular hypertrophy. Left ventricular diastolic  parameters are indeterminate.   Right Ventricle: The right ventricular size is moderately enlarged. Right  vetricular wall thickness was not well visualized. Right ventricular  systolic function is mildly reduced. There is mildly elevated pulmonary  artery systolic pressure. The tricuspid  regurgitant velocity is 2.32 m/s, and with an assumed right atrial  pressure of 15 mmHg, the estimated right ventricular systolic pressure is  56.3 mmHg.   Left Atrium: Left atrial size was mildly dilated.   Right Atrium: Right atrial size was moderately dilated.   Pericardium: There is no evidence of pericardial effusion.  Mitral Valve: The mitral valve is normal in structure. Mild mitral valve  regurgitation. No evidence of mitral valve stenosis.   Tricuspid Valve: The tricuspid valve is normal in structure. Tricuspid  valve regurgitation is mild . No evidence of tricuspid stenosis.   Aortic Valve: The aortic valve has an indeterminant number of cusps. There  is mild calcification of the aortic valve. Aortic valve regurgitation is  not visualized. Mild aortic valve sclerosis is present, with no evidence  of aortic valve stenosis.   Pulmonic Valve: The pulmonic valve was not well visualized. Pulmonic valve  regurgitation is not visualized.   Aorta: Aortic dilatation noted. There is mild to moderate dilatation of  the ascending aorta, measuring 44 mm.   Venous: The inferior vena cava is dilated in size with less than 50%   respiratory variability, suggesting right atrial pressure of 15 mmHg.   IAS/Shunts: The atrial septum is grossly normal.   Additional Comments: There is a small pleural effusion in both left and  right lateral regions.      Patient Profile     55 y.o. male  with a hx of HTN, nectrotizing myopathy, opioid use disorder on suboxone, ETOH abuse, low testosterone now admitted with CP,COugh, and SOB.  + RLL PNA, neg PE and  ascending thoracic aortic aneurysm, coronary artery calcifications, and aortic atherosclerosis on CTA of chest.  + decrease in EF and new atrial fib.    Assessment & Plan    1. Acute hypoxemic respiratory failure secondary to MRSA PNA - other issues include sepsis previously requiring pressurs, aspiration PNA/pneumonitis in setting of AMS - enlarging right pleural effusion/possible empyema with progressive PNA felt compatible with necrotizing pneumonia on CT - anasarca and mottling of distal lower extremities - PCCM managing critical illness--intubated 08/29/20 - there was question of needing TEE this admission for MRSA PNA.  As blood cultures are negative and no significant valvular disease on TTE, no need for TEE - still with fever   2. Newly recognized LV dysfunction/cardiomyopathy - etiology unclear at present time - could be related to stress of acute illness or ETOH, but cannot exclude underlying CAD with calcifications seen on CTA - elevated troponin on admission of 391 is out of proportion to acute LV dysfunction so do not suspect this represented an ACS but agree more likely demand ischemia - too ill for invasive evaluation for exclusion of coronary disease at present time - blood pressure has been too soft to consider GDMT but continue to re-eval daily as he remains off pressors is improving 120-130s so far today - judicious use of IV fluids - will defer volume management to primary team, some edema noted, CVP 23 today, consider gentle diuresis as BP  tolerates -+5446 since admit though down from +7956  -wt is down from pk of 241.12 lbs to 224.84 lbs   3. Paroxysmal atrial fibrillation - likely driven by acute medical illness, responded well to IV amiodarone and now maintaining NSR - holding anticoagulation for chest tube today, restart when able -(coronary/aortic ca++ on imaging) therefore anticoagulation typically warranted although remains unclear if this was solely an isolated arrhythmia in the context of the above - this cohort of patients typically has high likelihood of reversion to atrial fib while critically ill, so would continue amiodarone/anticoagulation for now - CHADSVASC 3 for LV dysfunction, HTN, and vascular disease  4. Thoracic aortic aneurysm - 4.6cm on CT 08/27/20, to consider semi-annual follow-up imaging if appropriate  For questions or updates, please contact [place] Please consult [email or web address] for contact info under     Signed, Cecilie Kicks, NP  09/02/2020, 10:36 AM    Patient seen and examined.  Agree with above documentation.  On exam, patient is intubated and sedated, regular rate and rhythm, no murmurs, coarse breath sounds, 1+ BLE edema.  Telemetry reviewed, shows normal sinus rhythm with rate 80s to 90s.  Would continue IV amiodarone to maintain sinus rhythm for now.  For his heart failure, suspect cardiomyopathy due to sepsis, will need reevaluation of systolic function as he recovers.  If LV function does not improve, then will need ischemia evaluation.   BP has improved and he is off pressors now.  Continue to monitor, can begin to start GDMT likely tomorrow if BP remains stable off pressors.  CVP elevated and he appears mildly volume overloaded on exam.  Started on IV Lasix yesterday, would continue.  Donato Heinz, MD

## 2020-09-02 NOTE — Progress Notes (Signed)
NAME:  Ava Deguire Genter, MRN:  [id number], DOB:  18-Jun-1966, LOS: 5 ADMISSION DATE:  08/27/2020, CONSULTATION DATE:  08/28/20 REFERRING MD:  Darliss Cheney, MD CHIEF COMPLAINT:  Acute hypoxemic respiratory failure  Brief History:  55 year old male on Suboxone for necrotizing myopathy who presents with acute hypoxemic respiratory failure secondary to community-acquired pneumonia/aspiration. PCCM initially consulted for evaluation for bronchoscopy for possible mucous plugging. Transferred to ICU after worsening respiratory failure and mental status requiring intubation on 2/21.  Past Medical History:  Hypertension, necrotizing myopathy, opioid use on Suboxone  Significant Hospital Events:  2/19 Arrived to ED for cough and chest pain x 1 week 2/20 Admitted to [facility] in early am. Intubated. Bronch 2/22 Worsening respiratory status and vasopressor requirement. Agitation on vent 2/23 Improving pressor requirement 2/24 Off pressors. Right chest tube placement. Propofol changed versed as TRG increased 2/25 about 650 out of chest tube since insertion. Stable vent requirements. Sedation and agitation a challenge. Still having fever. Still agitated. Adding librium  Consults:  TRH PCCM  Procedures:  2/24 right chest tube>>>  Significant Diagnostic Tests:  CTA - right sided consolidation and ground glass opacities in the right and middle lobe. Background centrilobular emphysema present. No pulmonary embolism  CT CAP 09/01/20 - Interval enlargement of right pleural effusion with some loculation. Progression of right airspace disease with cavitation in right middle/lower lobe. Anasarca Micro Data:  Blood culture 2/19>NGTD BAL resp cx 2/20> MRSA BAL fungal cx 2/20> BAL AFB cx 2/20> Urine strep ag 2/21>neg Urine legionella ag 2/21>neg Pleural fluid 2/24>>>  Antimicrobials:  Ceftriaxone 2/20>2/21 Azithro 2/20>2/21 Vanc 2/21> Zosyn 2/21>2/23  Interim History / Subjective:  Still having  fevers Sedated  Objective   Blood pressure 131/78, pulse 90, temperature 100.04 F (37.8 C), resp. rate (Abnormal) 24, height _0  (1.803 m), weight 102.2 kg, SpO2 100 %. CVP:  [13 mmHg-20 mmHg] 14 mmHg  Vent Mode: PRVC FiO2 (%):  [40 %] 40 % Set Rate:  [24 bmp] 24 bmp Vt Set:  [600 mL] 600 mL PEEP:  [5 cmH20] 5 cmH20 Plateau Pressure:  [18 cmH20-21 cmH20] 21 cmH20   Intake/Output Summary (Last 24 hours) at 09/02/2020 0819 Last data filed at 09/02/2020 0700 Gross per 24 hour  Intake 3382.79 ml  Output 3940 ml  Net -557.21 ml   Filed Weights   08/31/20 0400 09/01/20 0352 09/02/20 0500  Weight: 109.3 kg 109.6 kg 102.2 kg   Physical Exam: General this is a critically ill 55 year old male who remains sedated on vent HENT NCAT orally intubated. The left IJ CVL looks unremarkable at insertion site.  Pulm coarse bilateral breath sounds. He is fairly diminished on the right, The right chest tube has put out about 647m of bloody pleural fluid since insertion 2/24. No airleak. Dressing w/ bloody shadow but insertion site drainage appears to have resolved. Current Peep 5/fio2 40% pplat 24 sats mid 90s Card RRR abd soft tol FT GU foley in place. CL yellow Ext warm strong pulses. Lower extremity rash marked It had spread as of 2/24 from initial marking but is stable as of today Neuro sedated. Still grimaces to pain. Gets agitated easily still on fent and versed gtts.    Resolved Hospital Problem list   Elevated LFTs AKI Aspiration pneumonia/pneumonitis in setting of altered mental status Septic shock (off pressors as of 2/24) Cardiogenic shock  Assessment & Plan:   Acute hypoxemic respiratory failure secondary to MRSA pneumonia c/b abscess and parapneumonic pleural effusion vs empyema  s/p chest tube 2/24 Emphysema pcxr personally reviewed: s/p ct lines and tubes good position. Volume of pleural fluid reduced but right lung consolidation and airspace disease persists -NGTD from  pleural fluid. -CT output:  -vent setting Plan Cont full vent support Wean Peep/FIO2 for sats > 90% VAP bundle  PAD protocol -->RASS goal: -2 to -3 cxr today and again in am --> also look at pleural space again w/ [place]. Might be reasonable to try trial of TPA and DNase    Sepsis; shock state resolved. Still meets SIRS parameters Plan Awaiting pleural studies.  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 Appreciate input Plan Cont amiodarone Cont AC (therapeutic LMWH) Cont tele K goal > 4; Mg goal > 2  Acute metabolic and toxic encephalopathy in setting of fairly sig ETOH and Narcotic dependence, withdrawal AND sepsis H/o chronic pain d/t necrotizing myopathy (on suboxone) -Recently ran out of suboxone. Has taken OTC tylenol and ibuprofen for pain control Plan Cont to hold Suboxone Cont thiamine and folate PAD addressed above   Fluid and electrolyte imbalance (intermittent)  Plan Trend and replace as needed.   Abnormal LFTs -likely sepsis induced Plan Trend   Anemia of critical illness Plan Trend cbc   Best practice (evaluated daily)  Diet: TF Pain/Anxiety/Delirium protocol (if indicated): Yes-->PAD goal -2 to -3 VAP protocol (if indicated): Yes DVT prophylaxis: Lovenox, treatment dose GI prophylaxis: Protonix Glucose control: CBG q4h Mobility: As tolerated Disposition: SDU  Goals of Care:  Last date of multidisciplinary goals of care discussion:  Family and staff present:  Summary of discussion:  Follow up goals of care discussion due: 2/28 Code Status: Full code. Confirmed on 2/20  Updated wife daily. Last updated 2/23; will reach out today   My cct 34 minutes.   Erick Colace  ACNP-BC [place] Pager # [phone] OR # [phone] if no answer

## 2020-09-03 ENCOUNTER — Inpatient Hospital Stay (HOSPITAL_COMMUNITY): Payer: Medicare HMO

## 2020-09-03 DIAGNOSIS — I5021 Acute systolic (congestive) heart failure: Secondary | ICD-10-CM | POA: Diagnosis not present

## 2020-09-03 DIAGNOSIS — J869 Pyothorax without fistula: Secondary | ICD-10-CM

## 2020-09-03 DIAGNOSIS — J189 Pneumonia, unspecified organism: Secondary | ICD-10-CM | POA: Diagnosis not present

## 2020-09-03 LAB — COMPREHENSIVE METABOLIC PANEL
ALT: 119 U/L — ABNORMAL HIGH (ref 0–44)
AST: 90 U/L — ABNORMAL HIGH (ref 15–41)
Albumin: 1.8 g/dL — ABNORMAL LOW (ref 3.5–5.0)
Alkaline Phosphatase: 268 U/L — ABNORMAL HIGH (ref 38–126)
Anion gap: 8 (ref 5–15)
BUN: 21 mg/dL — ABNORMAL HIGH (ref 6–20)
CO2: 26 mmol/L (ref 22–32)
Calcium: 8.2 mg/dL — ABNORMAL LOW (ref 8.9–10.3)
Chloride: 112 mmol/L — ABNORMAL HIGH (ref 98–111)
Creatinine, Ser: 0.64 mg/dL (ref 0.61–1.24)
GFR, Estimated: >60 mL/min (ref >60)
Glucose, Bld: 125 mg/dL — ABNORMAL HIGH (ref 70–99)
Potassium: 3.7 mmol/L (ref 3.5–5.1)
Sodium: 146 mmol/L — ABNORMAL HIGH (ref 135–145)
Total Bilirubin: 0.8 mg/dL (ref 0.3–1.2)
Total Protein: 5.2 g/dL — ABNORMAL LOW (ref 6.5–8.1)

## 2020-09-03 LAB — GLUCOSE, CAPILLARY
Glucose-Capillary: 124 mg/dL — ABNORMAL HIGH (ref 70–99)
Glucose-Capillary: 65 mg/dL — ABNORMAL LOW (ref 70–99)
Glucose-Capillary: 143 mg/dL — ABNORMAL HIGH (ref 70–99)
Glucose-Capillary: 63 mg/dL — ABNORMAL LOW (ref 70–99)
Glucose-Capillary: 113 mg/dL — ABNORMAL HIGH (ref 70–99)
Glucose-Capillary: 120 mg/dL — ABNORMAL HIGH (ref 70–99)
Glucose-Capillary: 118 mg/dL — ABNORMAL HIGH (ref 70–99)

## 2020-09-03 LAB — CBC
HCT: 33.1 % — ABNORMAL LOW (ref 39.0–52.0)
Hemoglobin: 10.6 g/dL — ABNORMAL LOW (ref 13.0–17.0)
MCH: 31.2 pg (ref 26.0–34.0)
MCHC: 32 g/dL (ref 30.0–36.0)
MCV: 97.4 fL (ref 80.0–100.0)
Platelets: 198 10*3/uL (ref 150–400)
RBC: 3.4 MIL/uL — ABNORMAL LOW (ref 4.22–5.81)
RDW: 15 % (ref 11.5–15.5)
WBC: 14.4 10*3/uL — ABNORMAL HIGH (ref 4.0–10.5)
nRBC: 0.3 % — ABNORMAL HIGH (ref 0.0–0.2)

## 2020-09-03 LAB — MAGNESIUM: Magnesium: 2.1 mg/dL (ref 1.7–2.4)

## 2020-09-03 IMAGING — DX DG CHEST 1V PORT
1 series · 1 of 1 positions shown · non-contrast
Comparison: Portable chest [DATE] and earlier.

CLINICAL DATA: 55-year-old male admitted with necrotizing
pneumonia, sepsis. Right chest tube.

EXAM:
PORTABLE CHEST 1 VIEW

[chest ap]
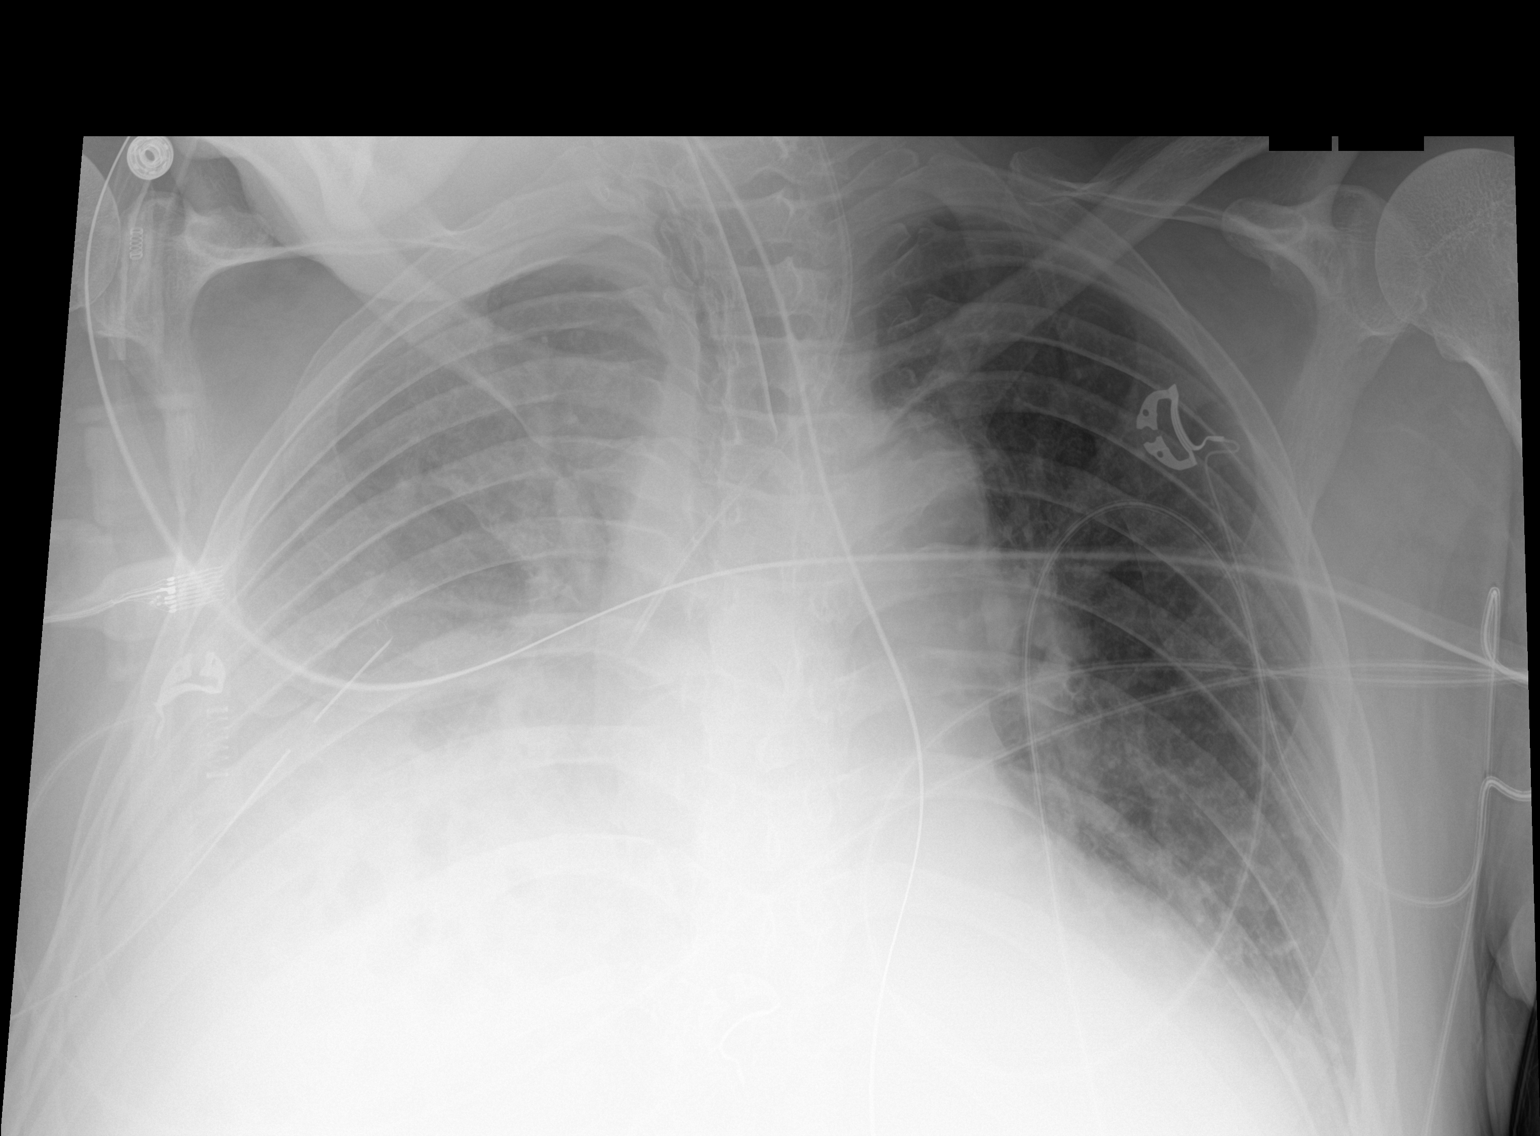

[1 of 1 positions shown; findings below may reference images not displayed]

FINDINGS: Portable AP semi upright view at [EG] hours. Stable lines and tubes.
The patient is more rotated to the right. No pneumothorax
identified. Continued veiling opacity in the right lung, and
cavitary changes superimposed on confluent opacity at the right lung
base. Retrocardiac opacity on the left appears unchanged. Stable
cardiac size and mediastinal contours.
IMPRESSION: 1.  Stable lines and tubes.
2. No change lung findings since yesterday allowing for patient
rotation to the right today.

## 2020-09-03 IMAGING — DX DG CHEST 1V PORT
1 series · 1 of 1 positions shown · non-contrast
Comparison: [DATE].

CLINICAL DATA: Shortness of breath.

EXAM:
PORTABLE CHEST 1 VIEW

[chest ap]
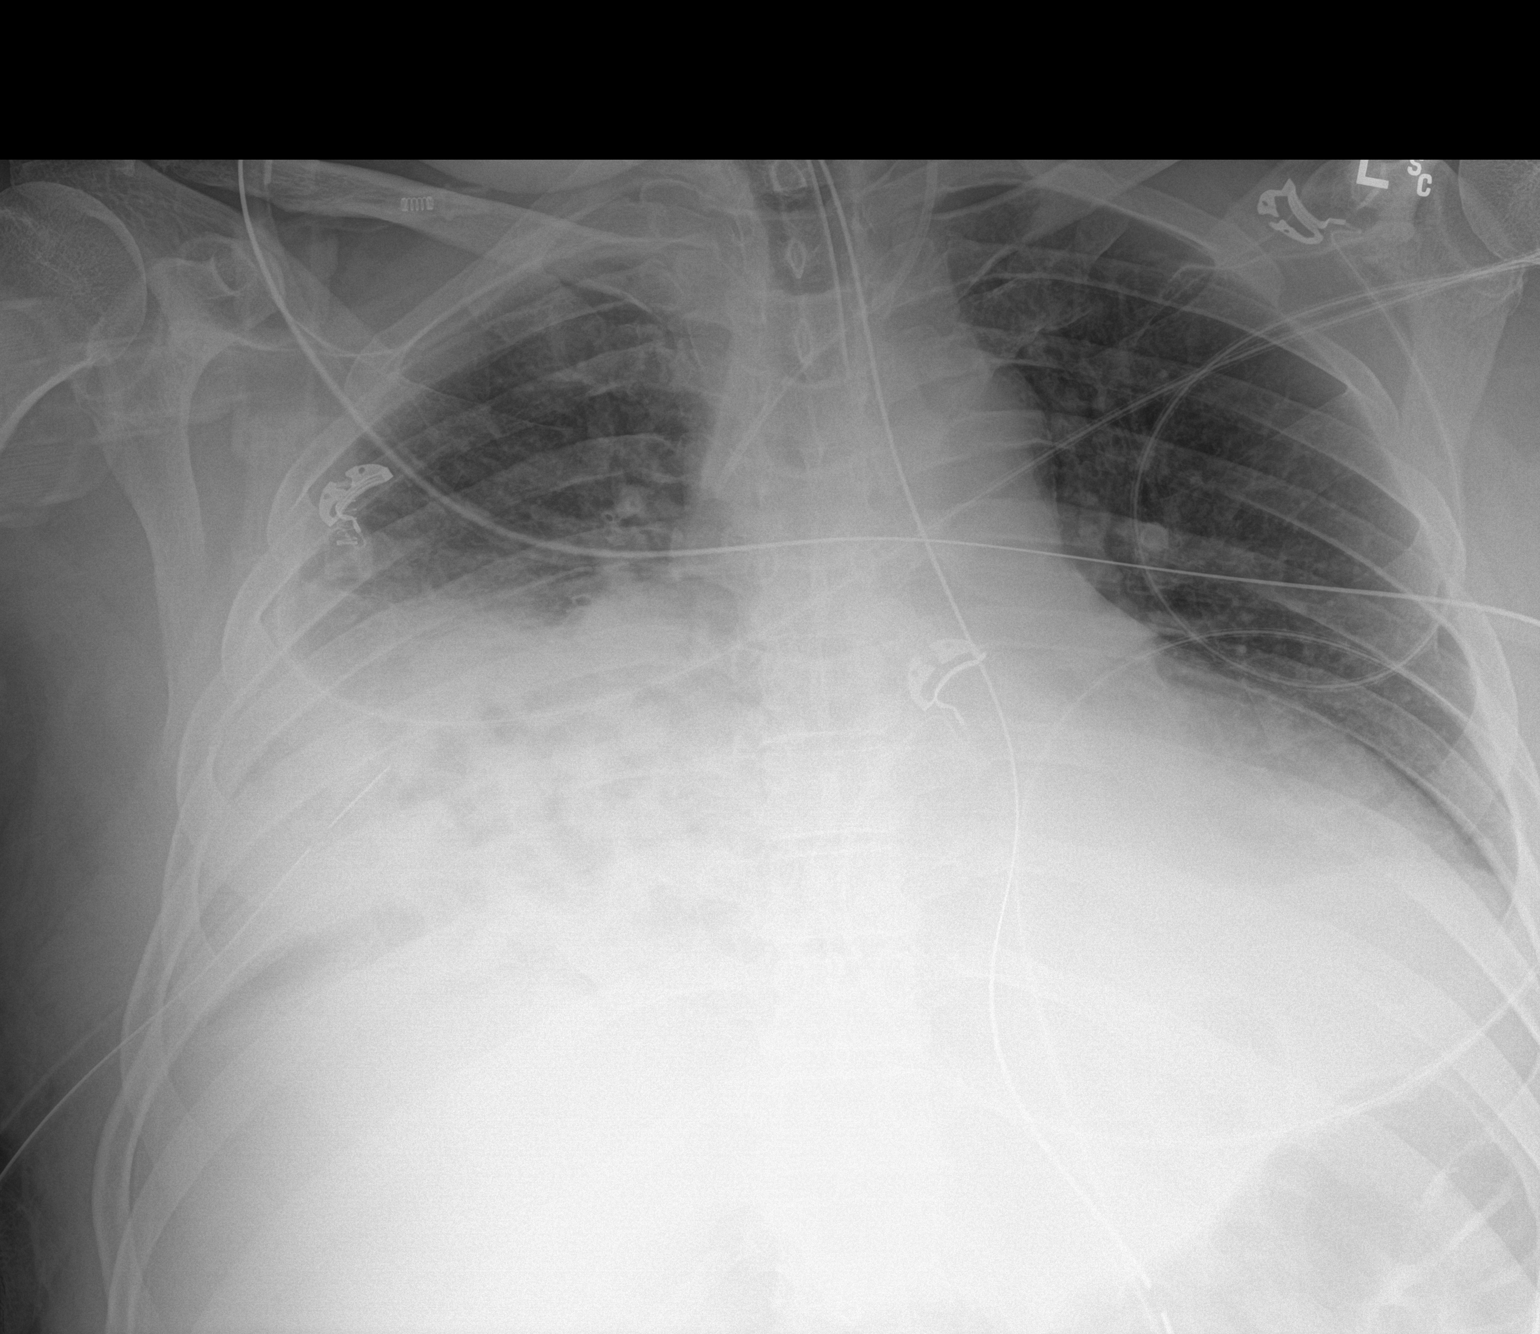

[1 of 1 positions shown; findings below may reference images not displayed]

FINDINGS: Stable cardiomegaly. Endotracheal and nasogastric tubes are
unchanged in position. No pneumothorax is noted. Right chest tube is
unchanged in position. Stable large right lower lobe opacity is
noted consistent with pneumonia or atelectasis with associated
pleural effusion. Minimal left basilar atelectasis is noted. Bony
thorax is unremarkable.
IMPRESSION: Stable support apparatus. Stable large right lower lobe opacity is
noted consistent with pneumonia or atelectasis with associated
pleural effusion.

## 2020-09-03 MED ORDER — HYDROMORPHONE HCL 1 MG/ML IJ SOLN
1.0000 mg | Freq: Once | INTRAMUSCULAR | Status: AC
  Administered 2020-09-03: 1 mg via INTRAVENOUS

## 2020-09-03 MED ORDER — STERILE WATER FOR INJECTION IJ SOLN
5.0000 mg | Freq: Two times a day (BID) | RESPIRATORY_TRACT | Status: AC
  Administered 2020-09-03: 5 mg via INTRAPLEURAL
  Filled 2020-09-03: qty 5

## 2020-09-03 MED ORDER — FUROSEMIDE 10 MG/ML IJ SOLN
40.0000 mg | Freq: Once | INTRAMUSCULAR | Status: AC
  Administered 2020-09-03: 40 mg via INTRAVENOUS

## 2020-09-03 MED ORDER — DEXTROSE 50 % IV SOLN
INTRAVENOUS | Status: AC
  Administered 2020-09-03: 25 mL
  Filled 2020-09-03: qty 50

## 2020-09-03 MED ORDER — SODIUM CHLORIDE (PF) 0.9 % IJ SOLN
10.0000 mg | Freq: Two times a day (BID) | INTRAMUSCULAR | Status: AC
  Administered 2020-09-03: 10 mg via INTRAPLEURAL
  Filled 2020-09-03: qty 10

## 2020-09-03 NOTE — Progress Notes (Addendum)
Progress Note  Patient Name: Joseph Ayala Date of Encounter: 09/03/2020  [facility] Cardiologist: Donato Heinz, MD   Subjective   No events overnight  Inpatient Medications    Scheduled Meds: . chlordiazePOXIDE  25 mg Per Tube QID  . chlorhexidine gluconate (MEDLINE KIT)  15 mL Mouth Rinse BID  . Chlorhexidine Gluconate Cloth  6 each Topical Daily  . docusate  100 mg Per Tube BID  . enoxaparin (LOVENOX) injection  100 mg Subcutaneous Q12H  . feeding supplement (PIVOT 1.5 CAL)  1,000 mL Per Tube Q24H  . folic acid  1 mg Per Tube Daily  . mouth rinse  15 mL Mouth Rinse 10 times per day  . midazolam  1 mg Intravenous Once  . multivitamin  15 mL Per Tube Daily  . pantoprazole sodium  40 mg Per Tube Daily  . polyethylene glycol  17 g Per Tube Daily  . sodium chloride flush  10-40 mL Intracatheter Q12H  . thiamine  100 mg Per Tube Daily   Continuous Infusions: . sodium chloride Stopped (08/31/20 2038)  . amiodarone 30 mg/hr (09/03/20 0717)  . dextrose 5% lactated ringers 75 mL/hr at 09/03/20 0717  . HYDROmorphone 3 mg/hr (09/03/20 0717)  . midazolam 8 mg/hr (09/03/20 0717)  . vancomycin Stopped (09/03/20 0143)   PRN Meds: acetaminophen (TYLENOL) oral liquid 160 mg/5 mL **OR** acetaminophen, albuterol, HYDROmorphone, ibuprofen, midazolam, sodium chloride flush   Vital Signs    Vitals:   09/03/20 0400 09/03/20 0500 09/03/20 0600 09/03/20 0700  BP: 116/71 119/71 103/62 113/69  Pulse: 90 90 81 83  Resp: (!) 24 (!) 24 (!) 21 (!) 24  Temp: 99.1 F (37.3 C)     TempSrc: Axillary     SpO2: 99% 100% 100% 100%  Weight:      Height:        Intake/Output Summary (Last 24 hours) at 09/03/2020 0734 Last data filed at 09/03/2020 0717 Gross per 24 hour  Intake 3534.12 ml  Output 4025 ml  Net -490.88 ml   Last 3 Weights 09/02/2020 09/01/2020 08/31/2020  Weight (lbs) 225 lb 5 oz 241 lb 10 oz 240 lb 15.4 oz  Weight (kg) 102.2 kg 109.6 kg 109.3 kg      Telemetry     SR - Personally Reviewed  ECG    n/a - Personally Reviewed  Physical Exam   GEN: No acute distress.   Neck: No JVD Cardiac: RRR, no murmurs, rubs, or gallops.  Respiratory: Coarse bilaterally GI: Soft, nontender, non-distended  MS: No edema; No deformity. Neuro:  Nonfocal  Psych: cannot assess, intubated/sedated  Labs    High Sensitivity Troponin:   Recent Labs  Lab 08/27/20 1953 08/28/20 0704 08/30/20 1646 08/30/20 2326  TROPONINIHS 9 17 391* 377*      Chemistry Recent Labs  Lab 09/01/20 0346 09/01/20 1211 09/02/20 0549 09/03/20 0259  NA 134* 139 143 146*  K 3.4* 3.6 3.8 3.7  CL 102 107 108 112*  CO2 21* '22 26 26  ' GLUCOSE 107* 113* 130* 125*  BUN 21* 14 16 21*  CREATININE 0.60* 0.45* 0.51* 0.64  CALCIUM 7.9* 8.2* 8.3* 8.2*  PROT 5.1*  --  5.6* 5.2*  ALBUMIN 2.0*  --  2.1* 1.8*  AST 89*  --  96* 90*  ALT 131*  --  129* 119*  ALKPHOS 285*  --  303* 268*  BILITOT 0.9  --  1.2 0.8  GFRNONAA >60 >60 >60 >60  ANIONGAP  '11 10 9 8     ' Hematology Recent Labs  Lab 09/01/20 0346 09/02/20 0549 09/03/20 0259  WBC 12.5* 14.5* 14.4*  RBC 3.16* 3.59* 3.40*  HGB 10.0* 11.3* 10.6*  HCT 30.0* 34.9* 33.1*  MCV 94.9 97.2 97.4  MCH 31.6 31.5 31.2  MCHC 33.3 32.4 32.0  RDW 15.0 14.9 15.0  PLT 150 177 198    BNP Recent Labs  Lab 08/28/20 0704  BNP 114.2*     DDimer No results for input(s): DDIMER in the last 168 hours.   Radiology    DG Chest Port 1 View  Result Date: 09/03/2020 CLINICAL DATA:  55 year old male admitted with necrotizing pneumonia, sepsis. Right chest tube. EXAM: PORTABLE CHEST 1 VIEW COMPARISON:  Portable chest 09/02/2020 and earlier. FINDINGS: Portable AP semi upright view at 0421 hours. Stable lines and tubes. The patient is more rotated to the right. No pneumothorax identified. Continued veiling opacity in the right lung, and cavitary changes superimposed on confluent opacity at the right lung base. Retrocardiac opacity on the left  appears unchanged. Stable cardiac size and mediastinal contours. IMPRESSION: 1.  Stable lines and tubes. 2. No change lung findings since yesterday allowing for patient rotation to the right today. Electronically Signed   By: Genevie Ann M.D.   On: 09/03/2020 05:19   DG Chest Port 1 View  Result Date: 09/02/2020 CLINICAL DATA:  55 year old male admitted with community-acquired pneumonia, sepsis. EXAM: PORTABLE CHEST 1 VIEW COMPARISON:  09/01/2020 portable chest. CT Chest, Abdomen, and Pelvis 08/31/2020 FINDINGS: Portable AP semi upright view at 0859 hours. Stable lines and tubes, including right side chest tube. Reduced but not resolved right pleural effusion which was large 2 days ago. No pneumothorax identified. Superimposed partially cavitary airspace disease at the right lung base, necrotizing pneumonia favored. Mediastinal contours remain normal. Left lung appears stable with less pronounced lower lobe collapse or consolidation. Paucity of bowel gas in the upper abdomen. Stable visualized osseous structures. IMPRESSION: 1. Stable lines and tubes. No pneumothorax. 2. Reduced but not resolved right pleural effusion with superimposed necrotizing right lung base pneumonia. 3. Stable left lower lobe collapse or consolidation. Electronically Signed   By: Genevie Ann M.D.   On: 09/02/2020 11:14   DG Chest Port 1 View  Result Date: 09/01/2020 CLINICAL DATA:  Chest tube placement. EXAM: PORTABLE CHEST 1 VIEW COMPARISON:  Chest radiographs 08/30/2020 and CT 08/31/2020 FINDINGS: An endotracheal tube terminates approximately 3.5 cm above the carina. A left jugular catheter terminates over the upper SVC. An enteric tube courses into the abdomen with tip not imaged. A right chest tube has been placed and terminates over the lower lung. There is evidence of a moderate residual right pleural effusion which is likely partially loculated laterally. Extensive right lung airspace consolidation is similar to the prior radiographs  with multiple small lucencies in the right lung base corresponding to cavitation on CT. Left basilar lung consolidation also has not significantly changed. No pneumothorax is identified. IMPRESSION: 1. Interval right chest tube placement. Moderate residual right pleural effusion. No pneumothorax. 2. Unchanged right greater than left lung consolidation consistent with pneumonia with cavitation in the right lung base. Electronically Signed   By: Logan Bores M.D.   On: 09/01/2020 11:01    Cardiac Studies    Patient Profile     55 y.o. male with a hx of HTN, nectrotizing myopathy, opioid use disorder on suboxone, ETOH abuse, low testosteronenow admitted with CP,COugh, and SOB.  + RLL PNA, neg  PE and ascending thoracic aortic aneurysm, coronary artery calcifications, and aortic atherosclerosis on CTA of chest.  + decrease in EF and new atrial fib.   Assessment & Plan    1. MRSA pneumonia/parapneumonia effusion w/ chest tube - other issues include sepsis previously requiring pressurs, aspiration PNA/pneumonitis in setting of AMS - enlarging right pleural effusion/possible empyema with progressive PNA felt compatible with necrotizing pneumonia on CT - anasarca and mottling of distal lower extremities - PCCM managing critical illness--intubated 08/29/20 -there wasquestion of needing TEE this admission for MRSA PNA. As blood cultures are negative and no significant valvular disease on TTE, no need for TEE  2. Systolic dysfunction/Acute systolic HF -new diagnosis this admisison, LVEF 35-40%, mild RV dysfunction - unclear etiology. Perhaps stress induced CM, vs EtoH, cannot exclude ischemic CM - not a cath candidate at this time given significant acute illness - medical therapy limited by low bp's. Still with intermittent low bp's, would continue to hold off.   - in setting of sepsis I/Os this admit. CVP is 19, though oxygenating well on 40% FiO2. Eventually will need some diuresis once bp's  allow.    3. Afib - new diagnosis, unclear if isoalted in setting of severe systemic illness - with low bp's has been on IV amio, in ICU setting he is on lovenox for anticoag - currently in SR, continue IV amio  4. Thoracic aortic aneurysm - 4.6cm on CT 08/27/20, to consider semi-annual follow-up imaging if appropriate  For questions or updates, please contact [place] Please consult [email or web address] for contact info under        Signed, Carlyle Dolly, MD  09/03/2020, 7:34 AM

## 2020-09-03 NOTE — Progress Notes (Signed)
eLink Physician-Brief Progress Note Patient Name: Joseph Ayala DOB: 27-May-1966 MRN: [id number]   Date of Service  09/03/2020  HPI/Events of Note  Urinary retention - Bladder scan with 425 mL residual.   eICU Interventions  Plan: 1. I/O cath PRN.      Intervention Category Major Interventions: Other:  Lenell Antu 09/03/2020, 3:41 AM

## 2020-09-03 NOTE — Procedures (Signed)
Procedure: instillation of pleural fibrinolytics Indication: loculated pleural effusion Description: 10mg of tPA in 30cc of saline and 5mg of dornase in 30cc of sterile water were injected into pleural space using existing pleural catheter.  30cc of saline were used to flush out dead space after.  Catheter will be clamped for 1 hour and then back to suction. Complications: none immediate  Tahiri Shareef MD  

## 2020-09-03 NOTE — Progress Notes (Signed)
NAME:  Joseph Ayala, MRN:  [id number], DOB:  09/16/1965, LOS: 6 ADMISSION DATE:  08/27/2020, CONSULTATION DATE:  08/28/20 REFERRING MD:  Darliss Cheney, MD CHIEF COMPLAINT:  Acute hypoxemic respiratory failure  Brief History:  55 year old male on Suboxone for necrotizing myopathy who presents with acute hypoxemic respiratory failure secondary to community-acquired pneumonia/aspiration. PCCM initially consulted for evaluation for bronchoscopy for possible mucous plugging. Transferred to ICU after worsening respiratory failure and mental status requiring intubation on 2/21.  Past Medical History:  Hypertension, necrotizing myopathy, opioid use on Suboxone  Significant Hospital Events:  2/19 Arrived to ED for cough and chest pain x 1 week 2/20 Admitted to [facility] in early am. Intubated. Bronch 2/22 Worsening respiratory status and vasopressor requirement. Agitation on vent 2/23 Improving pressor requirement 2/24 Off pressors. Right chest tube placement. Propofol changed versed as TRG increased 2/25 about 650 out of chest tube since insertion. Stable vent requirements. Sedation and agitation a challenge. Still having fever. Still agitated. Adding librium .  TPA into chest tube  Consults:  TRH PCCM  Procedures:  2/24 right chest tube>>>  Significant Diagnostic Tests:  CTA - right sided consolidation and ground glass opacities in the right and middle lobe. Background centrilobular emphysema present. No pulmonary embolism  CT CAP 09/01/20 - Interval enlargement of right pleural effusion with some loculation. Progression of right airspace disease with cavitation in right middle/lower lobe. Anasarca Micro Data:  Blood culture 2/19>NGTD BAL resp cx 2/20> MRSA BAL fungal cx 2/20> BAL AFB cx 2/20> Urine strep ag 2/21>neg Urine legionella ag 2/21>neg Pleural fluid 2/24>>>  Antimicrobials:  Ceftriaxone 2/20>2/21 Azithro 2/20>2/21 Vanc 2/21> Zosyn 2/21>2/23  Interim History / Subjective:   Still having fevers, T-max of 102, 2/25 Sedated  Objective   Blood pressure (!) 110/59, pulse 82, temperature 99.2 F (37.3 C), temperature source Axillary, resp. rate (!) 24, height _0  (1.803 m), weight 102.2 kg, SpO2 100 %. CVP:  [11 mmHg-19 mmHg] 11 mmHg  Vent Mode: PRVC FiO2 (%):  [40 %] 40 % Set Rate:  [24 bmp] 24 bmp Vt Set:  [600 mL] 600 mL PEEP:  [5 cmH20] 5 cmH20 Plateau Pressure:  [19 cmH20-26 cmH20] 21 cmH20   Intake/Output Summary (Last 24 hours) at 09/03/2020 1015 Last data filed at 09/03/2020 0900 Gross per 24 hour  Intake 3746.82 ml  Output 4025 ml  Net -278.18 ml   Filed Weights   08/31/20 0400 09/01/20 0352 09/02/20 0500  Weight: 109.3 kg 109.6 kg 102.2 kg   Physical Exam: 55 year old, chronically ill-appearing HENT NCAT endotracheal tube in place, Left IJ site looks fine Pulmonary: Coarse breath sounds bilaterally, decreased air movement on the right, no air leak in chest tube Significant effluent with TPA.  Dressing looks fine.. Current Peep 5/fio2 40% pplat 24 sats mid 90s Card: S1-S2 appreciated abd: Bowel sounds appreciated GU: Clear yellow, Foley in place Ext warm strong pulses. Lower extremity rash marked It had spread as of 2/24 from initial marking but is stable as of today Neuro sedated.  Grimaces to pain   Resolved Hospital Problem list   Elevated LFTs AKI Aspiration pneumonia/pneumonitis in setting of altered mental status Septic shock (off pressors as of 2/24) Cardiogenic shock  Assessment & Plan:   Acute hypoxemic respiratory failure secondary to MRSA pneumonia Parapneumonic pleural effusion versus empyema status post chest tube placement 2/24 S/p TPA 2/25 -Continue full vent support -Wean as tolerated still requiring significant sedation -Chest x-ray reviewed by myself showing improvement with TPA, will  repeat today  Sepsis Shocked state resolved -On vancomycin  New onset heart failure with ejection fraction of 35 to 40% New  onset atrial flutter/SVT secondary to sepsis Demand ischemia -Continue amiodarone -Continue anticoagulation with low molecular weight heparin -Continue telemetry monitoring  Metabolic/toxic encephalopathy Significant alcohol use and narcotic dependence Concern for withdrawal and sepsis History of chronic pain with necrotizing myopathy-on Suboxone -Hold Suboxone at present -Continue thiamine and folate  Abnormal LFTs -Continue to trend  Anemia of critical illness Plan Trend cbc   Best practice (evaluated daily)  Diet: TF Pain/Anxiety/Delirium protocol (if indicated): Yes-->PAD goal -2 to -3 VAP protocol (if indicated): Yes DVT prophylaxis: Lovenox, treatment dose GI prophylaxis: Protonix Glucose control: CBG q4h Mobility: As tolerated Disposition: SDU  Goals of Care:  Last date of multidisciplinary goals of care discussion:  Family and staff present:  Summary of discussion:  Follow up goals of care discussion due: 2/28 Code Status: Full code. Confirmed on 2/20  Updated wife daily. Last updated 2/25   The patient is critically ill with multiple organ systems failure and requires high complexity decision making for assessment and support, frequent evaluation and titration of therapies, application of advanced monitoring technologies and extensive interpretation of multiple databases. Critical Care Time devoted to patient care services described in this note independent of APP/resident time (if applicable)  is 32 minutes.   Sherrilyn Rist MD [facility] Personal pager: [phone] If unanswered, please page CCM On-call: [phone]

## 2020-09-03 NOTE — Progress Notes (Signed)
Called to bedside with patient not tolerating ventilator Suction for increased mucus  Had to receive multiple boluses of sedation FiO2 increased from 40% to 100%  Peak airway pressure did increase  With a history of mucous plugging.  Bronchoscopy was performed  Bronchoscopy significant for mucous plugging Significant amount of secretions bilaterally, blood-tinged  Suctioned effectively with visualization of all the bronchial orifices

## 2020-09-03 NOTE — Procedures (Signed)
Bronchoscopy Procedure Note  Joseph Ayala  [id number]  1965-09-14  Date:09/03/20  Time:4:10 PM   Provider Performing:George Haggart A Gad Aymond   Procedure(s):  Flexible Bronchoscopy (20254)  Indication(s) Mucous plugging  Consent Unable to obtain consent due to emergent nature of procedure.  Anesthesia Patient on sedation Versed and Dilaudid   Time Out Verified patient identification, verified procedure, site/side was marked, verified correct patient position, special equipment/implants available, medications/allergies/relevant history reviewed, required imaging and test results available.   Sterile Technique Usual hand hygiene, masks, gowns, and gloves were used   Procedure Description Bronchoscope advanced through endotracheal tube and into airway.  Airways were examined down to subsegmental level with findings noted below.   Following diagnostic evaluation, BAL(s) performed in Right main stem with normal saline and return of 30 fluid  Findings: Mucous plugging, significant amount of secretions bilaterally  Was suctioned effectively with visualization of all airway orifices   Complications/Tolerance None; patient tolerated the procedure well. Chest X-ray is not needed post procedure.   EBL none   Specimen(s) Lavage was sent for Gram stain and cultures, fungal studies

## 2020-09-03 NOTE — Progress Notes (Signed)
Hypoglycemic Event  CBG: 65  Treatment: 29ml D50  Symptoms: none  Follow-up CBG: Time 0900 CBG Result:63 finger stick 143 drawn from a-line  Possible Reasons for Event: Tube feed only infusing at 48ml/hr  Comments/MD notified:Dr Kendra Opitz, Carolynne Edouard

## 2020-09-04 DIAGNOSIS — I5021 Acute systolic (congestive) heart failure: Secondary | ICD-10-CM | POA: Diagnosis not present

## 2020-09-04 DIAGNOSIS — J869 Pyothorax without fistula: Secondary | ICD-10-CM | POA: Diagnosis not present

## 2020-09-04 LAB — COMPREHENSIVE METABOLIC PANEL
ALT: 111 U/L — ABNORMAL HIGH (ref 0–44)
AST: 77 U/L — ABNORMAL HIGH (ref 15–41)
Albumin: 1.8 g/dL — ABNORMAL LOW (ref 3.5–5.0)
Alkaline Phosphatase: 217 U/L — ABNORMAL HIGH (ref 38–126)
Anion gap: 9 (ref 5–15)
BUN: 27 mg/dL — ABNORMAL HIGH (ref 6–20)
CO2: 26 mmol/L (ref 22–32)
Calcium: 8.1 mg/dL — ABNORMAL LOW (ref 8.9–10.3)
Chloride: 111 mmol/L (ref 98–111)
Creatinine, Ser: 0.59 mg/dL — ABNORMAL LOW (ref 0.61–1.24)
GFR, Estimated: >60 mL/min (ref >60)
Glucose, Bld: 114 mg/dL — ABNORMAL HIGH (ref 70–99)
Potassium: 3.3 mmol/L — ABNORMAL LOW (ref 3.5–5.1)
Sodium: 146 mmol/L — ABNORMAL HIGH (ref 135–145)
Total Bilirubin: 0.8 mg/dL (ref 0.3–1.2)
Total Protein: 5.1 g/dL — ABNORMAL LOW (ref 6.5–8.1)

## 2020-09-04 LAB — GLUCOSE, CAPILLARY
Glucose-Capillary: 100 mg/dL — ABNORMAL HIGH (ref 70–99)
Glucose-Capillary: 102 mg/dL — ABNORMAL HIGH (ref 70–99)
Glucose-Capillary: 103 mg/dL — ABNORMAL HIGH (ref 70–99)
Glucose-Capillary: 111 mg/dL — ABNORMAL HIGH (ref 70–99)
Glucose-Capillary: 117 mg/dL — ABNORMAL HIGH (ref 70–99)
Glucose-Capillary: 117 mg/dL — ABNORMAL HIGH (ref 70–99)
Glucose-Capillary: 90 mg/dL (ref 70–99)

## 2020-09-04 LAB — CBC
HCT: 34.2 % — ABNORMAL LOW (ref 39.0–52.0)
Hemoglobin: 10.8 g/dL — ABNORMAL LOW (ref 13.0–17.0)
MCH: 31.1 pg (ref 26.0–34.0)
MCHC: 31.6 g/dL (ref 30.0–36.0)
MCV: 98.6 fL (ref 80.0–100.0)
Platelets: 238 10*3/uL (ref 150–400)
RBC: 3.47 MIL/uL — ABNORMAL LOW (ref 4.22–5.81)
RDW: 14.9 % (ref 11.5–15.5)
WBC: 16.4 10*3/uL — ABNORMAL HIGH (ref 4.0–10.5)
nRBC: 0.2 % (ref 0.0–0.2)

## 2020-09-04 LAB — MAGNESIUM: Magnesium: 2.1 mg/dL (ref 1.7–2.4)

## 2020-09-04 MED ORDER — POTASSIUM CHLORIDE 20 MEQ PO PACK
20.0000 meq | PACK | ORAL | Status: AC
Start: 2020-09-04 — End: 2020-09-04
  Administered 2020-09-04 (×2): 20 meq
  Filled 2020-09-04 (×2): qty 1

## 2020-09-04 MED ORDER — POTASSIUM CHLORIDE 10 MEQ/50ML IV SOLN
10.0000 meq | INTRAVENOUS | Status: AC
Start: 2020-09-04 — End: 2020-09-04
  Administered 2020-09-04 (×4): 10 meq via INTRAVENOUS
  Filled 2020-09-04 (×4): qty 50

## 2020-09-04 MED ORDER — ALBUMIN HUMAN 25 % IV SOLN
25.0000 g | Freq: Once | INTRAVENOUS | Status: AC
  Administered 2020-09-04: 25 g via INTRAVENOUS
  Filled 2020-09-04: qty 100

## 2020-09-04 MED ORDER — PHENYLEPHRINE HCL-NACL 10-0.9 MG/250ML-% IV SOLN
0.0000 ug/min | INTRAVENOUS | Status: DC
  Administered 2020-09-04: 20 ug/min via INTRAVENOUS
  Filled 2020-09-04: qty 250

## 2020-09-04 MED ORDER — FUROSEMIDE 10 MG/ML IJ SOLN
40.0000 mg | Freq: Two times a day (BID) | INTRAMUSCULAR | Status: AC
  Administered 2020-09-04 (×2): 40 mg via INTRAVENOUS
  Filled 2020-09-04: qty 4

## 2020-09-04 NOTE — Progress Notes (Signed)
Pharmacy Antibiotic Note  Aspen Deterding Betten is a 55 y.o. male admitted on 08/27/2020 with pneumonia.  Pharmacy has been consulted for vancomycin dosing. 09/04/2020  D#7 full MRSA abx 2/23 CT: necrotizing PNA, possible empyema Tm 102.2, WBC 16.4, SCr WNL 2/24 s/p chest tube for large pleural effusion - s/p TPA/Dornase 2/25 & 2/26  s/p bronch 2/26 for mucus plugs  Plan: continue vancomycin 2 gm IV q12 for AUC 505, Cmax 27.7/Cmin 15.4 F/u renal function, WBC, temp, culture data Vancomycin trough level tomorrow prior to noon dose  Height: _0  (180.3 cm) Weight: 102.4 kg (225 lb 12 oz) IBW/kg (Calculated) : 75.3  Temp (24hrs), Avg:100.2 F (37.9 C), Min:98.1 F (36.7 C), Max:102.2 F (39 C)  Recent Labs  Lab 08/29/20 1045 08/30/20 0449 08/30/20 0749 08/30/20 1021 08/30/20 1309 08/31/20 1034 08/31/20 1231 09/01/20 0034 09/01/20 0346 09/01/20 1211 09/02/20 0549 09/03/20 0259 09/04/20 0330  WBC  --    < >  --   --    < >  --  14.1*  --  12.5*  --  14.5* 14.4* 16.4*  CREATININE  --    < >  --   --    < >  --   --   --  0.60* 0.45* 0.51* 0.64 0.59*  LATICACIDVEN 2.1*  --  2.0* 2.4*  --  2.0*  --  1.4  --   --   --   --   --   VANCOTROUGH  --   --   --   --   --   --   --   --   --  14*  --   --   --   VANCOPEAK  --   --   --   --   --   --   --   --  23*  --   --   --   --    < > = values in this interval not displayed.    Estimated Creatinine Clearance: 130 mL/min (A) (by C-G formula based on SCr of 0.59 mg/dL (L)).    Allergies  Allergen Reactions  . Vicodin [Hydrocodone-Acetaminophen] Nausea And Vomiting   Antimicrobials this admission:  2/20 CTX >>2/20 2/20 Azithro >>2/23 2/20 vanc>> 2/20 zosyn>>2/23 Dose adjustments this admission:  2/24 VT 14, VP 23: AUC 443, Cmax 24.3, Cmin 13.4 on 1750 q12. Incr 2 gm q12, AUC 505. 27.7/15.4 Microbiology results:  2/19 SARS CoV2: negative 2/20 Acid Fast Culture (BAL)x3: neg x1 2/20 BAL: few MRSA (vanc MIC < 0.5) F 2/20 MRSA  PCR: positive 2/20 BCx: ngF 2/20 HIV NR 2/21 Strep pneumo UrAg: neg 2/21 legionella neg 2/24 pleural fluid: ngtd 2/26 bronch washings: GPC in pairs 2/26 bronch washings fungal cx: sent   Thank you for allowing pharmacy to be a part of this patient's care.  Eudelia Bunch, Pharm.D 09/04/2020 11:13 AM

## 2020-09-04 NOTE — Progress Notes (Signed)
eLink Physician-Brief Progress Note Patient Name: Archit Leger Twombly DOB: [birth year] MRN: [id number]   Date of Service  09/04/2020  HPI/Events of Note  Hypotension - BP + 88/49 with MAP = 59. Hgb = 10.8 and CVP = 13.  eICU Interventions  Plan: 1. Phenylephrine IV infusion. Titrate to MAP >= 65.      Intervention Category Major Interventions: Hypotension - evaluation and management  Lenell Antu 09/04/2020, 8:42 PM

## 2020-09-04 NOTE — Progress Notes (Signed)
AM K+ 3.3 with creat 0.59 and GFR > 60. ELink CCM electrolyte protocol initiated. 

## 2020-09-04 NOTE — Progress Notes (Signed)
NAME:  Joseph Ayala, MRN:  [id number], DOB:  Apr 14, 1966, LOS: 7 ADMISSION DATE:  08/27/2020, CONSULTATION DATE:  08/28/20 REFERRING MD:  Darliss Cheney, MD CHIEF COMPLAINT:  Acute hypoxemic respiratory failure  Brief History:  55 year old male on Suboxone for necrotizing myopathy who presents with acute hypoxemic respiratory failure secondary to community-acquired pneumonia/aspiration. PCCM initially consulted for evaluation for bronchoscopy for possible mucous plugging. Transferred to ICU after worsening respiratory failure and mental status requiring intubation on 2/21.  Past Medical History:  Hypertension, necrotizing myopathy, opioid use on Suboxone  Significant Hospital Events:  2/19 Arrived to ED for cough and chest pain x 1 week 2/20 Admitted to [facility] in early am. Intubated. Bronch 2/22 Worsening respiratory status and vasopressor requirement. Agitation on vent 2/23 Improving pressor requirement 2/24 Off pressors. Right chest tube placement. Propofol changed versed as TRG increased 2/25 about 650 out of chest tube since insertion. Stable vent requirements. Sedation and agitation a challenge. Still having fever. Still agitated. Adding librium .  TPA into chest tube  Consults:  TRH PCCM  Procedures:  2/24 right chest tube>>> 2/26 bronchoscopy for mucous plugging  Significant Diagnostic Tests:  CTA - right sided consolidation and ground glass opacities in the right and middle lobe. Background centrilobular emphysema present. No pulmonary embolism  CT CAP 09/01/20 - Interval enlargement of right pleural effusion with some loculation. Progression of right airspace disease with cavitation in right middle/lower lobe. Anasarca Micro Data:  Blood culture 2/19>NGTD BAL resp cx 2/20> MRSA BAL fungal cx 2/20> BAL AFB cx 2/20> Urine strep ag 2/21>neg Urine legionella ag 2/21>neg Pleural fluid 2/24>>> BAL 2/26-gram-positive cocci  Antimicrobials:  Ceftriaxone 2/20>2/21 Azithro  2/20>2/21 Vanc 2/21> Zosyn 2/21>2/23  Interim History / Subjective:  T-max 102.2 on 226 No overnight events post bronchoscopy on 2/26 for mucous plugging  Objective   Blood pressure 113/72, pulse 92, temperature 98.1 F (36.7 C), temperature source Axillary, resp. rate (!) 24, height _0  (1.803 m), weight 102.4 kg, SpO2 100 %. CVP:  [6 mmHg-18 mmHg] 11 mmHg  Vent Mode: PRVC FiO2 (%):  [40 %-100 %] 40 % Set Rate:  [24 bmp] 24 bmp Vt Set:  [600 mL] 600 mL PEEP:  [5 cmH20] 5 cmH20 Plateau Pressure:  [18 cmH20-25 cmH20] 20 cmH20   Intake/Output Summary (Last 24 hours) at 09/04/2020 0840 Last data filed at 09/04/2020 0700 Gross per 24 hour  Intake 3649.36 ml  Output 2770 ml  Net 879.36 ml   Filed Weights   09/01/20 0352 09/02/20 0500 09/04/20 0500  Weight: 109.6 kg 102.2 kg 102.4 kg   Physical Exam: Chronically ill-appearing HENT NCAT: Endotracheal tube in place Pulmonary: Coarse breath sounds with rhonchi, sounds better card: S1-S2 appreciated, no murmur abd: Bowel sounds appreciated GU: Clear yellow, Foley in place Ext warm strong pulses. Lower extremity rash marked It had spread as of 2/24 from initial marking but is stable as of today Neuro sedated.  Resolved Hospital Problem list   Elevated LFTs AKI Aspiration pneumonia/pneumonitis in setting of altered mental status Septic shock (off pressors as of 2/24) Cardiogenic shock  Assessment & Plan:   Acute hypoxemic respiratory failure secondary to MRSA pneumonia Parapneumonic pleural effusion versus empyema s/p chest tube placement 2/24 S/p TPA 2/25, 2/26 -Continue full vent support -Wean as tolerated -Did have mucus plugging yesterday that required bronchoscopy, sample sent for analysis -Lavage showing gram-positive cocci in pairs -Continue current antibiotics  Sepsis Shock state -On vancomycin  New onset heart failure with ejection fraction  of 35 to 40% New onset atrial flutter/SVT secondary to  sepsis Demand ischemia -On amiodarone -On anticoagulation  Metabolic encephalopathy Significant alcohol use and narcotic dependence Concern for withdrawal and sepsis -Suboxone on hold -Continue thiamine and folate  Trend LFT  Continue to monitor anemia -Transfuse per protocol as needed  Goal would be to wean sedation today to see whether we can start weaning him Cautious diuresis  Best practice (evaluated daily)  Diet: Continue tube feeds Pain/Anxiety/Delirium protocol (if indicated): Yes-->PAD goal -2 to -3 VAP protocol (if indicated): Yes DVT prophylaxis: Lovenox, treatment dose GI prophylaxis: Protonix Glucose control: CBG q4h Mobility: As tolerated Disposition: SDU  Goals of Care:  Last date of multidisciplinary goals of care discussion:  Family and staff present:  Summary of discussion:  Follow up goals of care discussion due: 2/28 Code Status: Full code. Confirmed on 2/20  Updated wife daily. Last updated 2/26  The patient is critically ill with multiple organ systems failure and requires high complexity decision making for assessment and support, frequent evaluation and titration of therapies, application of advanced monitoring technologies and extensive interpretation of multiple databases. Critical Care Time devoted to patient care services described in this note independent of APP/resident time (if applicable)  is 33 minutes.   Sherrilyn Rist MD [facility] Personal pager: [phone] If unanswered, please page CCM On-call: [phone]

## 2020-09-04 NOTE — Progress Notes (Signed)
Progress Note  Patient Name: Joseph Ayala Date of Encounter: 09/04/2020  [facility] Cardiologist: Donato Heinz, MD   Subjective  No acute issues overnight.   Inpatient Medications    Scheduled Meds: . chlordiazePOXIDE  25 mg Per Tube QID  . chlorhexidine gluconate (MEDLINE KIT)  15 mL Mouth Rinse BID  . Chlorhexidine Gluconate Cloth  6 each Topical Daily  . docusate  100 mg Per Tube BID  . enoxaparin (LOVENOX) injection  100 mg Subcutaneous Q12H  . feeding supplement (PIVOT 1.5 CAL)  1,000 mL Per Tube Q24H  . folic acid  1 mg Per Tube Daily  . mouth rinse  15 mL Mouth Rinse 10 times per day  . multivitamin  15 mL Per Tube Daily  . pantoprazole sodium  40 mg Per Tube Daily  . polyethylene glycol  17 g Per Tube Daily  . potassium chloride  20 mEq Per Tube Q4H  . sodium chloride flush  10-40 mL Intracatheter Q12H  . thiamine  100 mg Per Tube Daily   Continuous Infusions: . sodium chloride Stopped (08/31/20 2038)  . amiodarone 30 mg/hr (09/04/20 0700)  . dextrose 5% lactated ringers Stopped (09/04/20 0631)  . HYDROmorphone 3 mg/hr (09/04/20 0700)  . midazolam 5 mg/hr (09/04/20 0700)  . potassium chloride 50 mL/hr at 09/04/20 0700  . vancomycin Stopped (09/04/20 0142)   PRN Meds: acetaminophen (TYLENOL) oral liquid 160 mg/5 mL **OR** acetaminophen, albuterol, HYDROmorphone, ibuprofen, midazolam, sodium chloride flush   Vital Signs    Vitals:   09/04/20 0400 09/04/20 0500 09/04/20 0600 09/04/20 0700  BP: 119/86 122/71 114/72 113/72  Pulse: 80 83 87 92  Resp: (!) 24 (!) 24 (!) 24 (!) 24  Temp: 98.1 F (36.7 C)     TempSrc: Axillary     SpO2: 100% 100% 100% 100%  Weight:  102.4 kg    Height:        Intake/Output Summary (Last 24 hours) at 09/04/2020 0730 Last data filed at 09/04/2020 0700 Gross per 24 hour  Intake 3517.8 ml  Output 2770 ml  Net 747.8 ml   Last 3 Weights 09/04/2020 09/02/2020 09/01/2020  Weight (lbs) 225 lb 12 oz 225 lb 5 oz 241 lb  10 oz  Weight (kg) 102.4 kg 102.2 kg 109.6 kg      Telemetry    NSR - Personally Reviewed  ECG    n/a - Personally Reviewed  Physical Exam   GEN: No acute distress.   Neck: No JVD Cardiac: RRR, no murmurs, rubs, or gallops.  Respiratory: Clear to auscultation bilaterally. GI: Soft, nontender, non-distended  MS: 1+ bilateral LE edema; No deformity. Neuro:  Nonfocal  Psych: cannot assess, intubated and sedated  Labs    High Sensitivity Troponin:   Recent Labs  Lab 08/27/20 1953 08/28/20 0704 08/30/20 1646 08/30/20 2326  TROPONINIHS 9 17 391* 377*      Chemistry Recent Labs  Lab 09/02/20 0549 09/03/20 0259 09/04/20 0330  NA 143 146* 146*  K 3.8 3.7 3.3*  CL 108 112* 111  CO2 '26 26 26  ' GLUCOSE 130* 125* 114*  BUN 16 21* 27*  CREATININE 0.51* 0.64 0.59*  CALCIUM 8.3* 8.2* 8.1*  PROT 5.6* 5.2* 5.1*  ALBUMIN 2.1* 1.8* 1.8*  AST 96* 90* 77*  ALT 129* 119* 111*  ALKPHOS 303* 268* 217*  BILITOT 1.2 0.8 0.8  GFRNONAA >60 >60 >60  ANIONGAP '9 8 9     ' Hematology Recent Labs  Lab  09/02/20 0549 09/03/20 0259 09/04/20 0330  WBC 14.5* 14.4* 16.4*  RBC 3.59* 3.40* 3.47*  HGB 11.3* 10.6* 10.8*  HCT 34.9* 33.1* 34.2*  MCV 97.2 97.4 98.6  MCH 31.5 31.2 31.1  MCHC 32.4 32.0 31.6  RDW 14.9 15.0 14.9  PLT 177 198 238    BNPNo results for input(s): BNP, PROBNP in the last 168 hours.   DDimer No results for input(s): DDIMER in the last 168 hours.   Radiology    DG CHEST PORT 1 VIEW  Result Date: 09/03/2020 CLINICAL DATA:  Shortness of breath. EXAM: PORTABLE CHEST 1 VIEW COMPARISON:  September 03, 2020. FINDINGS: Stable cardiomegaly. Endotracheal and nasogastric tubes are unchanged in position. No pneumothorax is noted. Right chest tube is unchanged in position. Stable large right lower lobe opacity is noted consistent with pneumonia or atelectasis with associated pleural effusion. Minimal left basilar atelectasis is noted. Bony thorax is unremarkable.  IMPRESSION: Stable support apparatus. Stable large right lower lobe opacity is noted consistent with pneumonia or atelectasis with associated pleural effusion. Electronically Signed   By: Marijo Conception M.D.   On: 09/03/2020 16:20   DG Chest Port 1 View  Result Date: 09/03/2020 CLINICAL DATA:  55 year old male admitted with necrotizing pneumonia, sepsis. Right chest tube. EXAM: PORTABLE CHEST 1 VIEW COMPARISON:  Portable chest 09/02/2020 and earlier. FINDINGS: Portable AP semi upright view at 0421 hours. Stable lines and tubes. The patient is more rotated to the right. No pneumothorax identified. Continued veiling opacity in the right lung, and cavitary changes superimposed on confluent opacity at the right lung base. Retrocardiac opacity on the left appears unchanged. Stable cardiac size and mediastinal contours. IMPRESSION: 1.  Stable lines and tubes. 2. No change lung findings since yesterday allowing for patient rotation to the right today. Electronically Signed   By: Genevie Ann M.D.   On: 09/03/2020 05:19   DG Chest Port 1 View  Result Date: 09/02/2020 CLINICAL DATA:  55 year old male admitted with community-acquired pneumonia, sepsis. EXAM: PORTABLE CHEST 1 VIEW COMPARISON:  09/01/2020 portable chest. CT Chest, Abdomen, and Pelvis 08/31/2020 FINDINGS: Portable AP semi upright view at 0859 hours. Stable lines and tubes, including right side chest tube. Reduced but not resolved right pleural effusion which was large 2 days ago. No pneumothorax identified. Superimposed partially cavitary airspace disease at the right lung base, necrotizing pneumonia favored. Mediastinal contours remain normal. Left lung appears stable with less pronounced lower lobe collapse or consolidation. Paucity of bowel gas in the upper abdomen. Stable visualized osseous structures. IMPRESSION: 1. Stable lines and tubes. No pneumothorax. 2. Reduced but not resolved right pleural effusion with superimposed necrotizing right lung base  pneumonia. 3. Stable left lower lobe collapse or consolidation. Electronically Signed   By: Genevie Ann M.D.   On: 09/02/2020 11:14    Cardiac Studies    Patient Profile     55 y.o.malewith a hx of HTN, nectrotizing myopathy, opioid use disorder on suboxone, ETOH abuse, low testosteronenow admitted with CP,COugh, and SOB. + RLL PNA, neg PE and ascending thoracic aortic aneurysm, coronary artery calcifications, and aortic atherosclerosison CTA of chest. + decrease in EF and new atrial fib.   Assessment & Plan    1. MRSA pneumonia/parapneumonia effusion w/ chest tube - other issues include sepsis previously requiring pressurs, aspiration PNA/pneumonitis in setting of AMS - enlarging right pleural effusion/possible empyema with progressive PNA felt compatible with necrotizing pneumonia on CT - anasarca and mottling of distal lower extremities - PCCM managing  critical illness--intubated 08/29/20 -there wasquestion of needing TEE this admission for MRSA PNA. As blood cultures are negative and no significant valvular disease on TTE, no need for TEE  2. Systolic dysfunction/Acute systolic HF -new diagnosis this admisison, LVEF 35-40%, mild RV dysfunction - unclear etiology. Perhaps stress induced CM, vs EtoH, cannot exclude ischemic CM - not a cath candidate at this time given significant acute illness  - low bp's have prohibited starting CHF regimen to this point. With need to diurese and some soft bp's would continue to hold.  - CVP 19 yesterday, net + yesterday but CVP reported this AM is 6, I suspect inaccurate. Still appears volume overloaded.Repeat CVP - increase lasix to 43m IV bid today (ordering 2 doses, have not placed not a standing lasix order)   3. Afib - new diagnosis, unclear if isolated in setting of severe systemic illness - with low bp's has been on IV amio, in ICU setting he is on lovenox for anticoag - currently in SR, continue IV amio  4. Thoracic aortic  aneurysm - 4.6cm on CT 08/27/20, to consider semi-annual follow-up imaging if appropriate  For questions or updates, please contact CCentrevillePlease consult [email or web address] for contact info under        Signed, BCarlyle Dolly MD  09/04/2020, 7:30 AM

## 2020-09-05 ENCOUNTER — Inpatient Hospital Stay (HOSPITAL_COMMUNITY): Payer: Medicare HMO

## 2020-09-05 DIAGNOSIS — J189 Pneumonia, unspecified organism: Secondary | ICD-10-CM | POA: Diagnosis not present

## 2020-09-05 DIAGNOSIS — I959 Hypotension, unspecified: Secondary | ICD-10-CM

## 2020-09-05 DIAGNOSIS — J9601 Acute respiratory failure with hypoxia: Secondary | ICD-10-CM | POA: Diagnosis not present

## 2020-09-05 DIAGNOSIS — I5041 Acute combined systolic (congestive) and diastolic (congestive) heart failure: Secondary | ICD-10-CM | POA: Diagnosis not present

## 2020-09-05 DIAGNOSIS — F1190 Opioid use, unspecified, uncomplicated: Secondary | ICD-10-CM | POA: Diagnosis not present

## 2020-09-05 DIAGNOSIS — I480 Paroxysmal atrial fibrillation: Secondary | ICD-10-CM | POA: Diagnosis not present

## 2020-09-05 LAB — GLUCOSE, CAPILLARY
Glucose-Capillary: 90 mg/dL (ref 70–99)
Glucose-Capillary: 66 mg/dL — ABNORMAL LOW (ref 70–99)
Glucose-Capillary: 93 mg/dL (ref 70–99)
Glucose-Capillary: 89 mg/dL (ref 70–99)
Glucose-Capillary: 88 mg/dL (ref 70–99)
Glucose-Capillary: 97 mg/dL (ref 70–99)

## 2020-09-05 LAB — BLOOD GAS, ARTERIAL
Acid-Base Excess: 2.3 mmol/L — ABNORMAL HIGH (ref 0.0–2.0)
Bicarbonate: 27.3 mmol/L (ref 20.0–28.0)
FIO2: 40
MECHVT: 600 mL
O2 Saturation: 94.6 %
PEEP: 5 cmH2O
Patient temperature: 98.6
RATE: 24 resp/min
pCO2 arterial: 46.6 mmHg (ref 32.0–48.0)
pH, Arterial: 7.385 (ref 7.350–7.450)
pO2, Arterial: 81.3 mmHg — ABNORMAL LOW (ref 83.0–108.0)

## 2020-09-05 LAB — CBC
HCT: 32.7 % — ABNORMAL LOW (ref 39.0–52.0)
Hemoglobin: 10.1 g/dL — ABNORMAL LOW (ref 13.0–17.0)
MCH: 31.1 pg (ref 26.0–34.0)
MCHC: 30.9 g/dL (ref 30.0–36.0)
MCV: 100.6 fL — ABNORMAL HIGH (ref 80.0–100.0)
Platelets: 277 10*3/uL (ref 150–400)
RBC: 3.25 MIL/uL — ABNORMAL LOW (ref 4.22–5.81)
RDW: 14.7 % (ref 11.5–15.5)
WBC: 17.3 10*3/uL — ABNORMAL HIGH (ref 4.0–10.5)
nRBC: 0.1 % (ref 0.0–0.2)

## 2020-09-05 LAB — COMPREHENSIVE METABOLIC PANEL
ALT: 99 U/L — ABNORMAL HIGH (ref 0–44)
AST: 74 U/L — ABNORMAL HIGH (ref 15–41)
Albumin: 1.9 g/dL — ABNORMAL LOW (ref 3.5–5.0)
Alkaline Phosphatase: 172 U/L — ABNORMAL HIGH (ref 38–126)
Anion gap: 11 (ref 5–15)
BUN: 29 mg/dL — ABNORMAL HIGH (ref 6–20)
CO2: 25 mmol/L (ref 22–32)
Calcium: 8.2 mg/dL — ABNORMAL LOW (ref 8.9–10.3)
Chloride: 109 mmol/L (ref 98–111)
Creatinine, Ser: 0.62 mg/dL (ref 0.61–1.24)
GFR, Estimated: >60 mL/min (ref >60)
Glucose, Bld: 279 mg/dL — ABNORMAL HIGH (ref 70–99)
Potassium: 3.4 mmol/L — ABNORMAL LOW (ref 3.5–5.1)
Sodium: 145 mmol/L (ref 135–145)
Total Bilirubin: 1 mg/dL (ref 0.3–1.2)
Total Protein: 5 g/dL — ABNORMAL LOW (ref 6.5–8.1)

## 2020-09-05 LAB — VANCOMYCIN, TROUGH: Vancomycin Tr: 17 ug/mL (ref 15–20)

## 2020-09-05 LAB — MAGNESIUM: Magnesium: 1.9 mg/dL (ref 1.7–2.4)

## 2020-09-05 IMAGING — CT CT CHEST W/O CM
4 of 8 series · 16 of 36 positions shown, 18 images · non-contrast
Comparison: Prior CT studies on [DATE] and [DATE].

CLINICAL DATA: Respiratory failure, necrotizing pneumonia of right
lung with associated empyema and status post right-sided chest tube
placement with administration of intrapleural lytic therapy.

EXAM:
CT CHEST WITHOUT CONTRAST
TECHNIQUE: Multidetector CT imaging of the chest was performed following the
standard protocol without IV contrast.

[Series 2: thorax · axial · 0.81mm/px · z∈[+1576,+1810]mm · 6 of 165 slices shown, 8 images (1 of 2)]
[im 24/165  mediastinal]
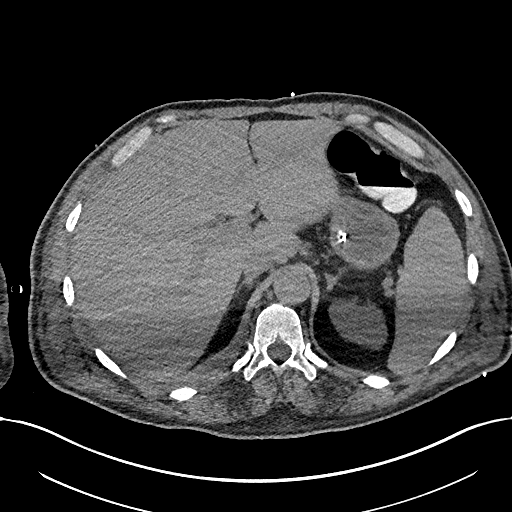
[im 24/165  lung]
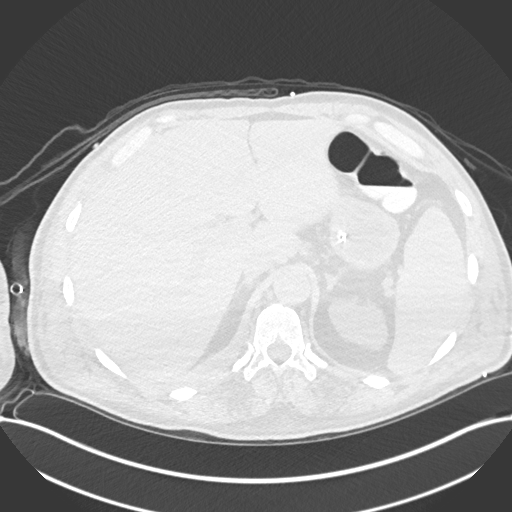
[im 47/165  lung]
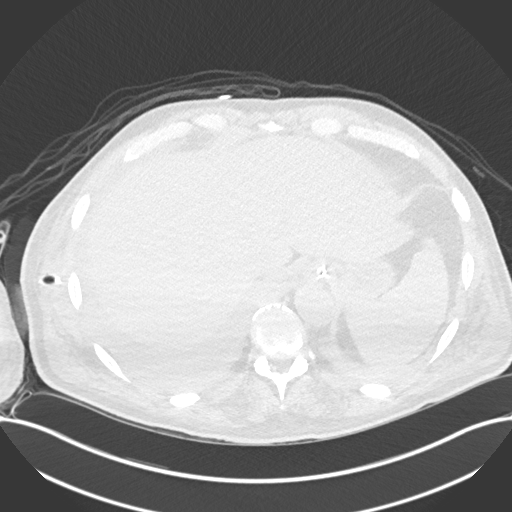
[im 71/165  lung]
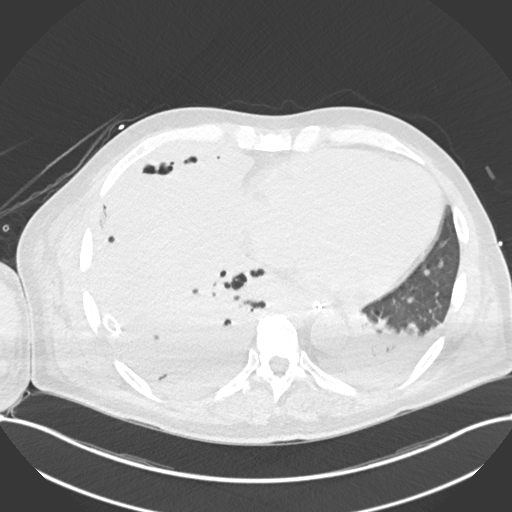
[im 94/165  lung]
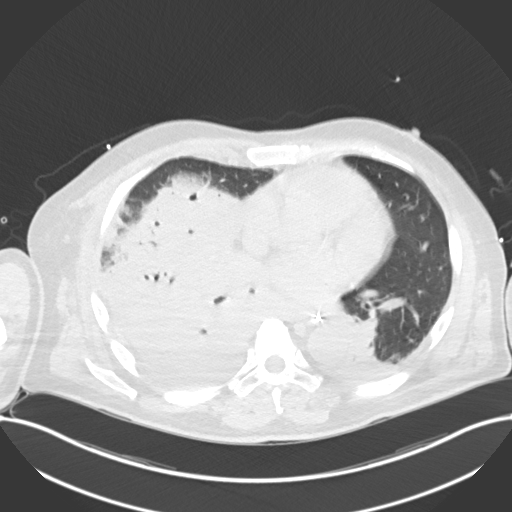
[im 118/165  mediastinal]
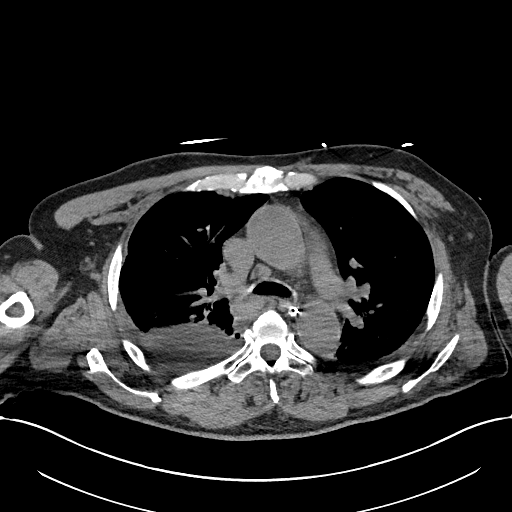
[im 118/165  lung]
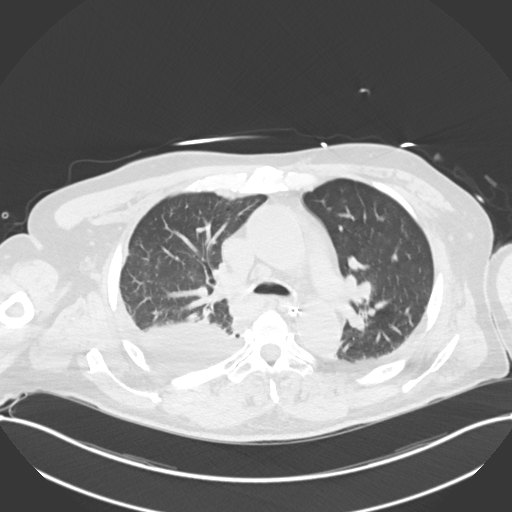
[im 141/165  lung]
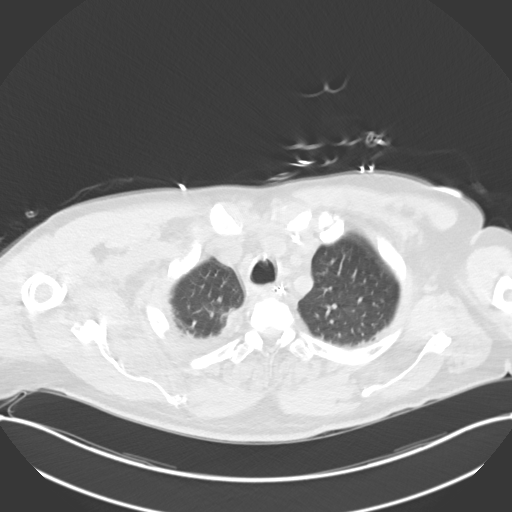

[Series 5: thorax · axial · 0.81mm/px · z∈[+1696,+1776]mm · 3 of 102 slices shown (2 of 2)]
[im 21/102  lung]
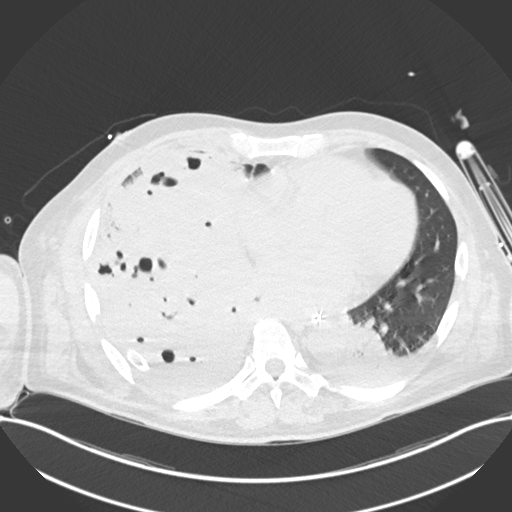
[im 41/102  lung]
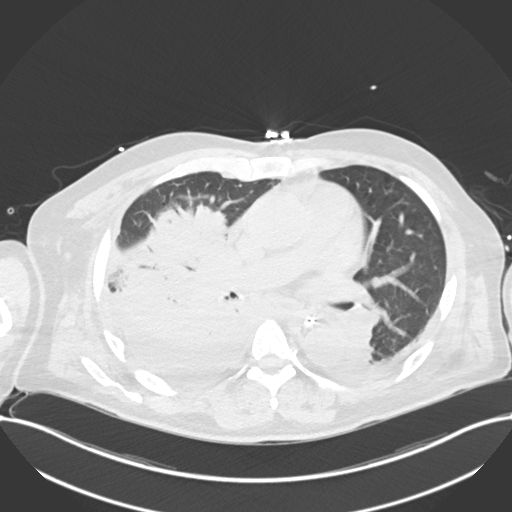
[im 61/102  lung]
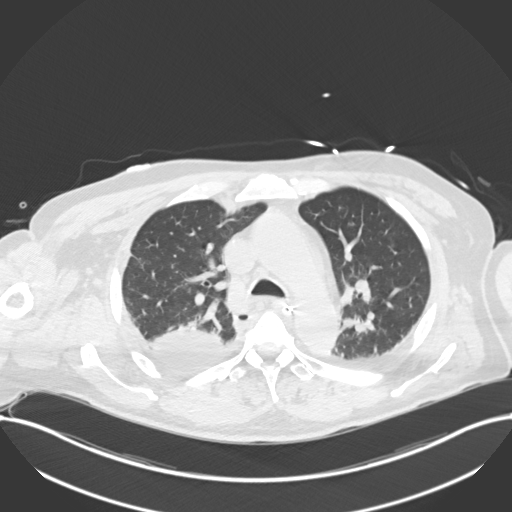

[Series 8: coronal · coronal · 0.69mm/px · 1 of 149 slices shown]
[im 75/149  lung]
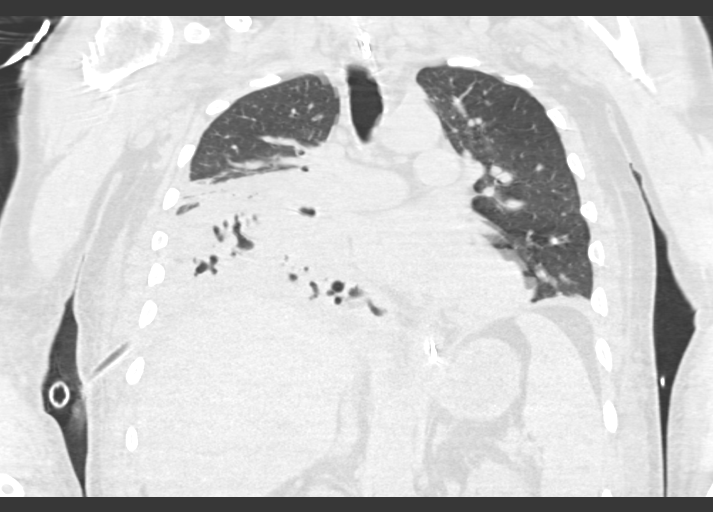

[Series 10: lungs · axial · 0.81mm/px · z∈[+1618,+1818]mm · 6 of 140 slices shown]
[im 20/140  lung]
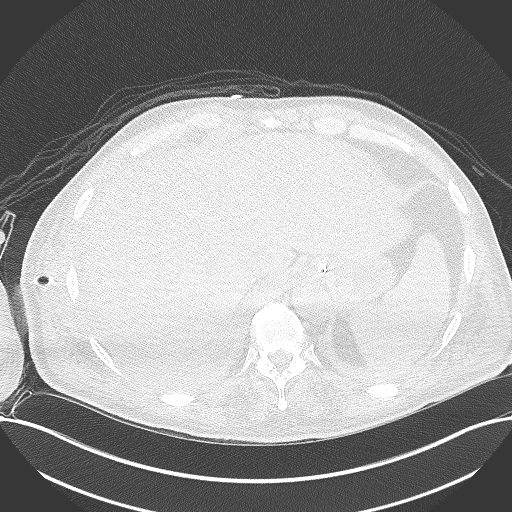
[im 40/140  lung]
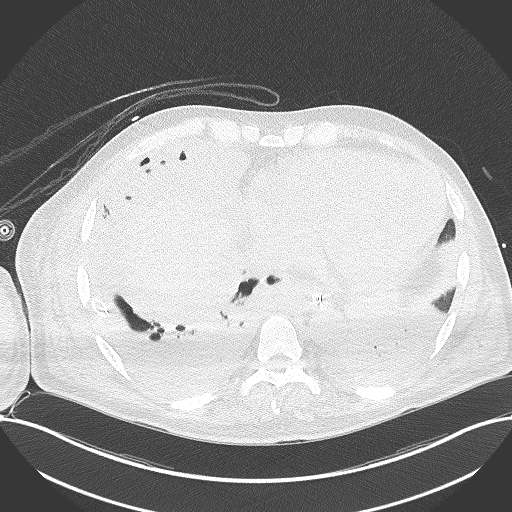
[im 60/140  lung]
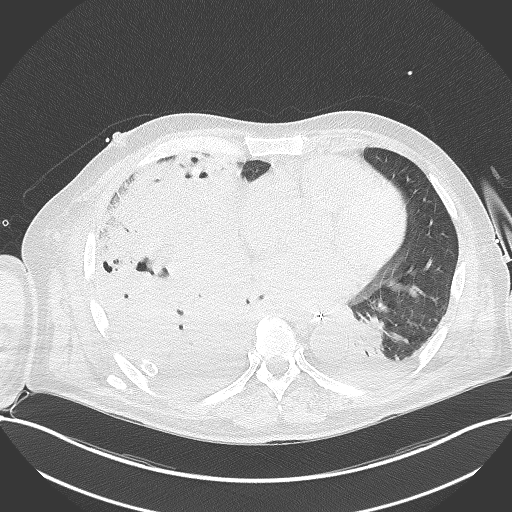
[im 80/140  lung]
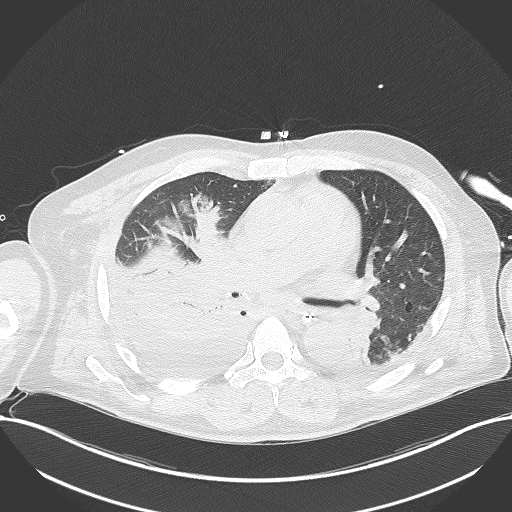
[im 100/140  lung]
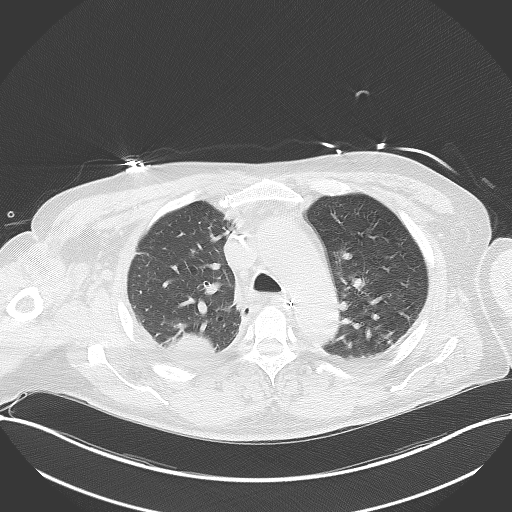
[im 120/140  lung]
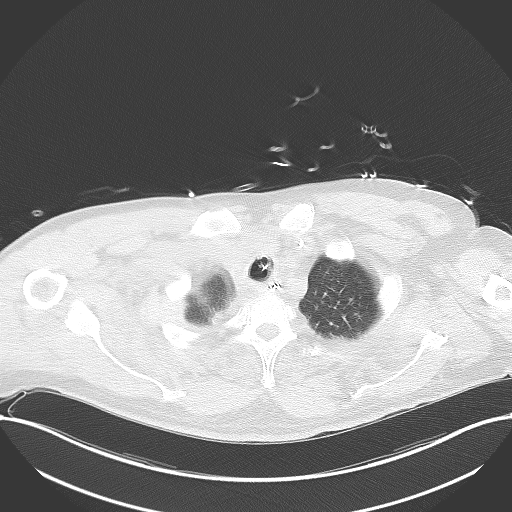

[16 of 36 positions shown; findings below may reference images not displayed]

FINDINGS: Cardiovascular: Stable heart size and coronary artery
calcifications. Stable mild dilatation of the ascending thoracic
aorta which measures up to approximately 4.2 cm in greatest
diameter. Left jugular central line present extending into the upper
SVC.

Mediastinum/Nodes: Scattered small mediastinal lymph nodes appears
stable and likely are reactive.

Lungs/Pleura: Left lateral chest tube enters the lower pleural space
and ascends in the posterior pleural space. It is somewhat difficult
to separate pleural abnormality from adjacent parenchymal
abnormality without the presence of IV contrast. However, there does
not appear to be a large amount of residual pleural fluid present
and most of the low-density abnormality in the right mid to lower
chest is felt to represent necrotic and consolidated right lower
lobe and right middle lobe. There is still some fluid component
likely present in the pleural space posteriorly, mainly superior to
the tip of the indwelling chest tube. No pneumothorax. Atelectasis
present involving the posterior left lower lobe.

Upper Abdomen: No acute abnormality.

Musculoskeletal: No chest wall mass or suspicious bone lesions
identified.
IMPRESSION: 1. No large amount of residual pleural fluid present with most of
the low-density abnormality in the right mid to lower chest felt to
represent necrotic and consolidated right lower lobe and right
middle lobe. There is still some fluid component likely present in
the pleural space posteriorly, mainly superior to the tip of the
indwelling chest tube. It is difficult to differentiate pleural from
parenchymal abnormality due to similar densities by CT and any
follow-up imaging of the chest by CT would benefit from
administration of IV contrast, if possible.
2. Stable mild dilatation of the ascending thoracic aorta which
measures up to approximately 4.2 cm in greatest diameter.

## 2020-09-05 IMAGING — DX DG CHEST 1V PORT
1 series · 1 of 1 positions shown · non-contrast
Comparison: [DATE]

CLINICAL DATA: Respiratory failure

EXAM:
PORTABLE CHEST 1 VIEW

[chest ap]
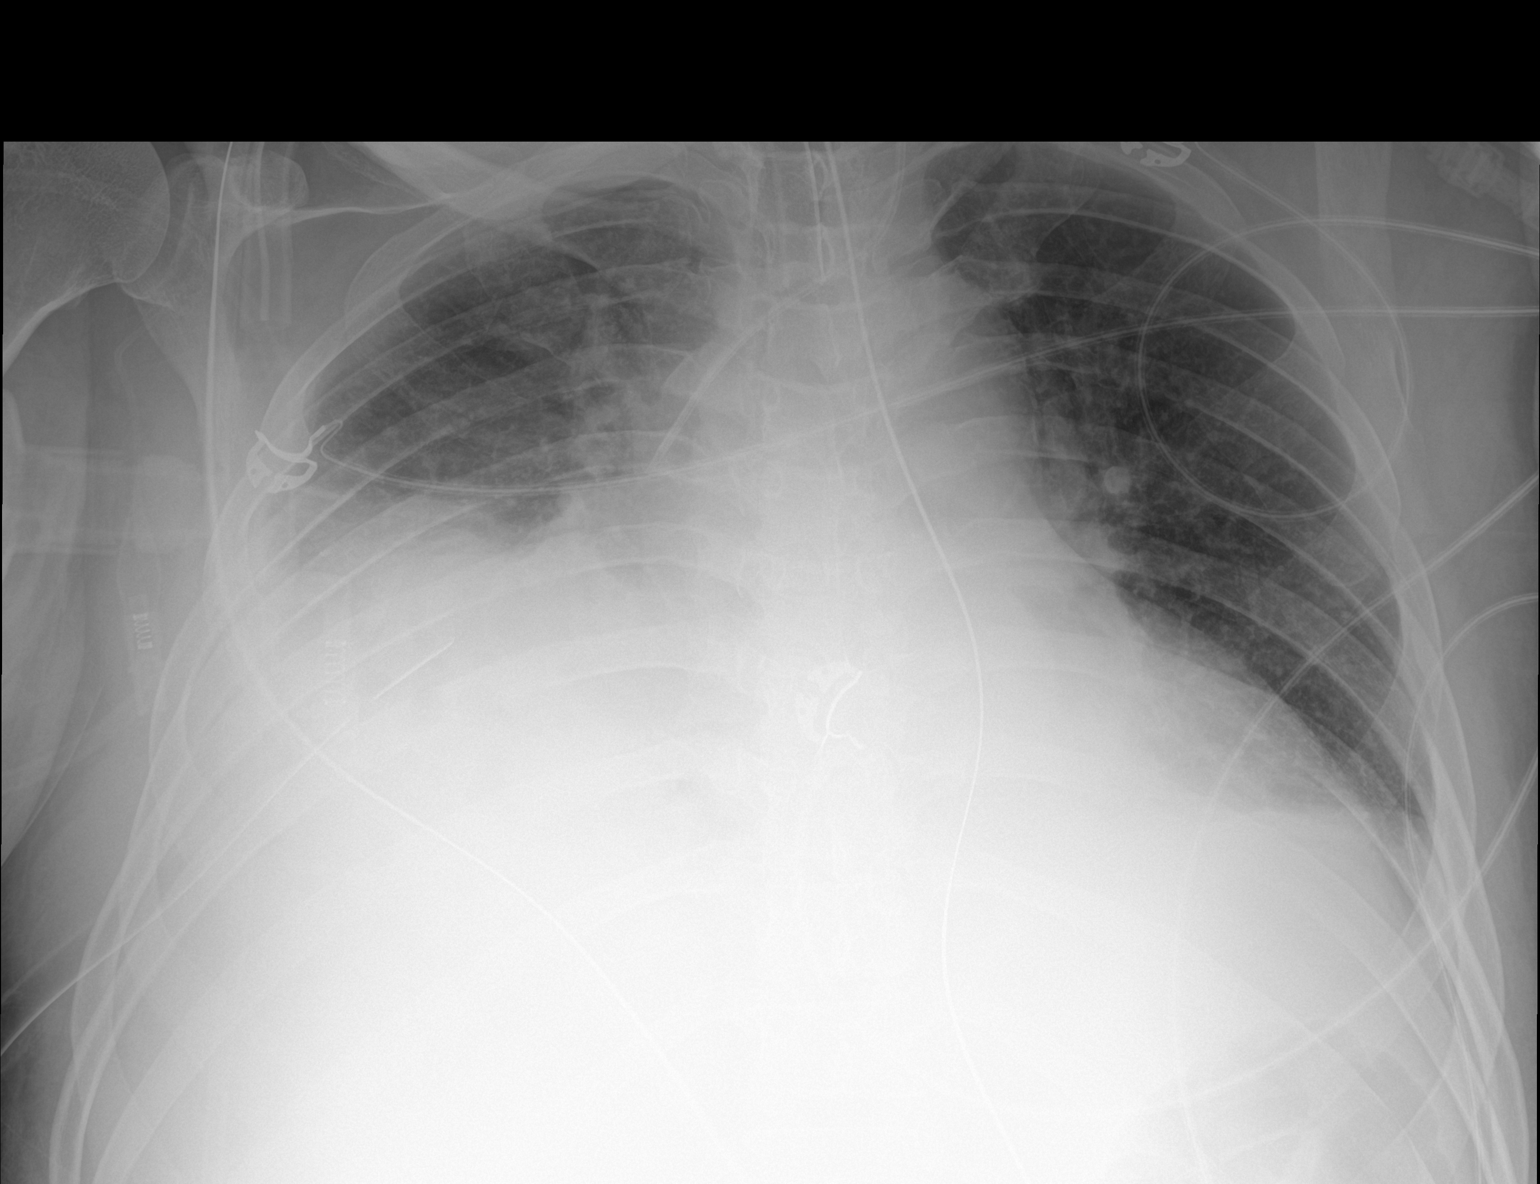

[1 of 1 positions shown; findings below may reference images not displayed]

FINDINGS: Support devices are stable. Right chest tube in place. No
pneumothorax. Cardiomegaly, vascular congestion. Right lower lobe
atelectasis or infiltrate with small right pleural effusion again
noted, unchanged. Left base atelectasis.
IMPRESSION: Support devices stable.

No pneumothorax.

Continued right lower lobe atelectasis or infiltrate with small
right effusion.

## 2020-09-05 MED ORDER — ACETYLCYSTEINE 10% NICU INHALATION SOLUTION: 2.0000 mL | Freq: Two times a day (BID) | RESPIRATORY_TRACT | Status: DC

## 2020-09-05 MED ORDER — OXYCODONE HCL 5 MG/5ML PO SOLN
5.0000 mg | Freq: Four times a day (QID) | ORAL | Status: DC
  Administered 2020-09-05 – 2020-09-10 (×20): 5 mg
  Filled 2020-09-05 (×20): qty 5

## 2020-09-05 MED ORDER — POTASSIUM CHLORIDE 20 MEQ PO PACK
40.0000 meq | PACK | Freq: Once | ORAL | Status: AC
  Administered 2020-09-05: 40 meq
  Filled 2020-09-05: qty 2

## 2020-09-05 MED ORDER — MAGNESIUM SULFATE 2 GM/50ML IV SOLN
2.0000 g | Freq: Once | INTRAVENOUS | Status: AC
  Administered 2020-09-05: 2 g via INTRAVENOUS
  Filled 2020-09-05: qty 50

## 2020-09-05 MED ORDER — FUROSEMIDE 10 MG/ML IJ SOLN
40.0000 mg | Freq: Two times a day (BID) | INTRAMUSCULAR | Status: AC
  Administered 2020-09-05 – 2020-09-06 (×2): 40 mg via INTRAVENOUS
  Filled 2020-09-05 (×2): qty 4

## 2020-09-05 MED ORDER — ACETYLCYSTEINE 20 % IN SOLN
2.0000 mL | Freq: Two times a day (BID) | RESPIRATORY_TRACT | Status: AC
  Administered 2020-09-05 – 2020-09-08 (×7): 2 mL via RESPIRATORY_TRACT
  Filled 2020-09-05 (×6): qty 4

## 2020-09-05 NOTE — Progress Notes (Signed)
Wife updated in detail on CT images - little residual effusion, largely consolidated right lung.  CT in position.  Discussed plan of care in detail.     Canary Brim, MSN, APRN, NP-C, AGACNP-BC [facility] 09/05/2020, 5:07 PM   Please see [email or web address] for pager details.   From 7A-7P if no response, please call [phone] After hours, please call ELink [phone]

## 2020-09-05 NOTE — Progress Notes (Addendum)
NAME:  Joseph Ayala, MRN:  [id number], DOB:  06-14-1966, LOS: 8 ADMISSION DATE:  08/27/2020, CONSULTATION DATE:  08/28/20 REFERRING MD:  Darliss Cheney, MD CHIEF COMPLAINT:  Acute hypoxemic respiratory failure  Brief History:  55 year old male on Suboxone for necrotizing myopathy admitted 2/19 with acute hypoxemic respiratory failure secondary to MRSA community-acquired pneumonia / aspiration. PCCM initially consulted for evaluation for bronchoscopy for possible mucous plugging. Transferred to ICU after worsening respiratory failure and mental status requiring intubation on 2/21. ICU course complicated by hypotension requiring pressors, chest tube placement for empyema s/p intrapleural lytics.   Past Medical History:  Hypertension, necrotizing myopathy, opioid use on Suboxone  Significant Hospital Events:  2/19 Arrived to ED for cough and chest pain x 1 week 2/20 Admitted to [facility] in early am. Intubated. Bronch 2/22 Worsening respiratory status and vasopressor requirement. Agitation on vent 2/23 Improving pressor requirement 2/24 Off pressors. Right chest tube placement. Propofol changed versed as TRG increased 2/25 About 650 out of CT since insertion. Stable vent requirements. Sedation and agitation a challenge / librium added. Fever. TPA into chest tube 2/26 FOB for mucus plugging  Consults:  TRH PCCM  Procedures:  ETT 2/21 >>  R Radial Aline 2/20 >> L IJ TLC 2/21 >> R CT 2/24 >>  Significant Diagnostic Tests:   CTA Chest 2/19 >> right sided consolidation and ground glass opacities in the right and middle lobe. Background centrilobular emphysema present. No pulmonary embolism  CT CAP 2/24 >> Interval enlargement of right pleural effusion with some loculation. Progression of right airspace disease with cavitation in right middle/lower lobe. Anasarca  CT Chest w/o 2/28 >>   Micro Data:  Blood culture 2/19 >> negative  BAL resp cx 2/20 >> MRSA BAL fungal cx 2/20 >> BAL AFB cx  2/20 >> Urine strep ag 2/21 >> neg Urine legionella ag 2/21 >> neg Pleural fluid 2/24 >> BAL 2/26 >> abundant staph aureus >>   Antimicrobials:  Ceftriaxone 2/20 >>2/21 Azithro 2/20 >> 2/21 Vanc 2/21 >> Zosyn 2/21 >> 2/23  Interim History / Subjective:  Vent - 40%, PEEP 5 Glucose range 93-279 Tmax 101.3 / WBC 17.3  I/O 2.6L UOP, 50 ml out of chest tube, +459m in last 24 hours  RN reports pt briefly on neosynephrine overnight, aline placed, received albumin > thought sedation related   Objective   Blood pressure 110/63, pulse 90, temperature 99 F (37.2 C), temperature source Oral, resp. rate (!) 21, height _0  (1.803 m), weight 101.2 kg, SpO2 97 %. CVP:  [11 mmHg-15 mmHg] 13 mmHg  Vent Mode: PRVC FiO2 (%):  [40 %] 40 % Set Rate:  [24 bmp] 24 bmp Vt Set:  [600 mL] 600 mL PEEP:  [5 cmH20] 5 cmH20 Plateau Pressure:  [20 cmH20-23 cmH20] 20 cmH20   Intake/Output Summary (Last 24 hours) at 09/05/2020 0909 Last data filed at 09/05/2020 07353Gross per 24 hour  Intake 3015.47 ml  Output 2650 ml  Net 365.47 ml   Filed Weights   09/02/20 0500 09/04/20 0500 09/05/20 0500  Weight: 102.2 kg 102.4 kg 101.2 kg   Physical Exam: General: adult male lying in bed in NAD on vent  HEENT: MM pink/moist, ETT, pupils 211m anicteric Neuro: sedate, periods of intermittent thrashing of head side to side, MAE CV: s1s2 RRR, ST on monitor, no m/r/g PULM: non-labored on vent, bronchial breath sounds on right, right chest tube in place GI: soft, bsx4 active  Extremities: warm/dry, no edema  Skin:  abrasions to bilateral knees, non-blanching erythema on bilateral feet   PCXR 2/28 >> images personally reviewed, dense RLL infiltrate / opacity, CT in good position, ETT / NGT in good position  Resolved Hospital Problem list   Elevated LFTs AKI Aspiration pneumonia/pneumonitis in setting of altered mental status Septic shock (off pressors as of 2/24) Cardiogenic shock   Assessment & Plan:    Acute hypoxemic respiratory failure secondary to MRSA PNA with Effusion   Parapneumonic Effusion versus Empyema  Mucus Plugging  S/p chest tube placement 2/24, tPA 2/25, 2/26 -PRVC 8cc/kg as rest mode  -daily SBT / WUA as tolerated  -await pleural fluid  -BAL with abundant staph  -continue vancomycin  -add chest PT via bed  -mucomyst BID x48 hours -assess CT chest w/o to evaluate for residual empyema vs consolidation  Septic Shock  Additional sedation effect  -follow cultures  -off vasopressors 2/28  -continue vancomycin  -hold lasix 2/28 with use of vasopressors overnight  New onset heart failure with ejection fraction of 35 to 40% New onset atrial flutter/SVT secondary to sepsis Demand ischemia -continue amiodarone  -reduce D5LR to 16m/hr  -continue full dose lovenox   Acute Metabolic Encephalopathy Significant alcohol use and narcotic dependence Concern for withdrawal and sepsis -hold home suboxone  -continue librium  -increase ceiling of dilaudid to 858mhr -add PT oxycodone  -continue versed gtt  -RASS goal -2 to -3   Hypokalemia  -monitor, replace as indicated   Moderate Protein Calorie Malnutrition  -TF per Nutrition   Elevated LFT's -monitor trend  Macrocytic Anemia  -trend CBC  -transfuse per protocol  -continue folate, MVI, thiamine    Best practice (evaluated daily)  Diet: TF Pain/Anxiety/Delirium protocol (if indicated): PAD goal -2 to -3 VAP protocol (if indicated): Yes DVT prophylaxis: Lovenox, treatment dose GI prophylaxis: Protonix Glucose control: CBG q4h Mobility: As tolerated Disposition: ICU  Goals of Care:  Last date of multidisciplinary goals of care discussion:  Family and staff present:  Summary of discussion:  Follow up goals of care discussion due:3/7 Code Status: Full code. Confirmed 2/28  Wife, Ginger, updated via phone 2/28 (total ~ 17 minutes)  Critical Care Time: 34 minutes   BrNoe GensMSN, APRN, NP-C,  AGACNP-BC [facility] 09/05/2020, 9:46 AM   Please see [email or web address] for pager details.   From 7A-7P if no response, please call [phone] After hours, please call ELink [phone]

## 2020-09-05 NOTE — Progress Notes (Signed)
[facility] ADULT ICU REPLACEMENT PROTOCOL   The patient does apply for the [facility] Adult ICU Electrolyte Replacment Protocol based on the criteria listed below:   1. Is GFR >/= 30 ml/min? Yes.    Patient's GFR today is >60 2. Is SCr </= 2? Yes.   Patient's SCr is 0.62 ml/kg/hr 3. Did SCr increase >/= 0.5 in 24 hours? No. 4. Abnormal electrolyte(s): K+3.4 Mag1.9 5. Ordered repletion with: protocol 6. If a panic level lab has been reported, has the CCM MD in charge been notified? Yes.  .   Physician:  Dr Althia Forts, Lilia Argue 09/05/2020 5:27 AM

## 2020-09-05 NOTE — Progress Notes (Signed)
Progress Note  Patient Name: Joseph Ayala Date of Encounter: 09/05/2020  [facility] Cardiologist: Donato Heinz, MD   Subjective   Intubated and sedated. No family at bedside  Inpatient Medications    Scheduled Meds: . chlordiazePOXIDE  25 mg Per Tube QID  . chlorhexidine gluconate (MEDLINE KIT)  15 mL Mouth Rinse BID  . Chlorhexidine Gluconate Cloth  6 each Topical Daily  . docusate  100 mg Per Tube BID  . enoxaparin (LOVENOX) injection  100 mg Subcutaneous Q12H  . feeding supplement (PIVOT 1.5 CAL)  1,000 mL Per Tube Q24H  . folic acid  1 mg Per Tube Daily  . mouth rinse  15 mL Mouth Rinse 10 times per day  . multivitamin  15 mL Per Tube Daily  . pantoprazole sodium  40 mg Per Tube Daily  . polyethylene glycol  17 g Per Tube Daily  . sodium chloride flush  10-40 mL Intracatheter Q12H  . thiamine  100 mg Per Tube Daily   Continuous Infusions: . sodium chloride Stopped (08/31/20 2038)  . amiodarone 30 mg/hr (09/05/20 3545)  . dextrose 5% lactated ringers Stopped (09/04/20 2300)  . HYDROmorphone 4 mg/hr (09/05/20 6256)  . midazolam 5 mg/hr (09/05/20 0752)  . phenylephrine (NEO-SYNEPHRINE) Adult infusion Stopped (09/05/20 0252)  . vancomycin Stopped (09/05/20 0226)   PRN Meds: acetaminophen (TYLENOL) oral liquid 160 mg/5 mL **OR** acetaminophen, albuterol, HYDROmorphone, ibuprofen, midazolam, sodium chloride flush   Vital Signs    Vitals:   09/05/20 0600 09/05/20 0615 09/05/20 0742 09/05/20 0822  BP:      Pulse: 94 91    Resp: 20 (!) 24    Temp:   99 F (37.2 C)   TempSrc:   Oral   SpO2: 96% 97%  97%  Weight:      Height:        Intake/Output Summary (Last 24 hours) at 09/05/2020 0830 Last data filed at 09/05/2020 3893 Gross per 24 hour  Intake 3091.84 ml  Output 2650 ml  Net 441.84 ml   Last 3 Weights 09/05/2020 09/04/2020 09/02/2020  Weight (lbs) 223 lb 1.7 oz 225 lb 12 oz 225 lb 5 oz  Weight (kg) 101.2 kg 102.4 kg 102.2 kg       Telemetry    Sinus rhythm with stable HR's - Personally Reviewed  ECG    No new tracings - Personally Reviewed  Physical Exam   GEN: Critically ill appearing - intubated and seddated.   Neck: No JVD Cardiac: RRR, no murmurs, rubs, or gallops.  Respiratory: coarse breath sounds bilaterally without overt wheezing, rhonchi, or rales. GI: Soft, nontender, non-distended  MS: 2+ LE edema; No deformity. Neuro:  sedated - unable to assess  Psych: sedated - unable to assess  Labs    High Sensitivity Troponin:   Recent Labs  Lab 08/27/20 1953 08/28/20 0704 08/30/20 1646 08/30/20 2326  TROPONINIHS 9 17 391* 377*      Chemistry Recent Labs  Lab 09/03/20 0259 09/04/20 0330 09/05/20 0434  NA 146* 146* 145  K 3.7 3.3* 3.4*  CL 112* 111 109  CO2 _0 GLUCOSE 125* 114* 279*  BUN 21* 27* 29*  CREATININE 0.64 0.59* 0.62  CALCIUM 8.2* 8.1* 8.2*  PROT 5.2* 5.1* 5.0*  ALBUMIN 1.8* 1.8* 1.9*  AST 90* 77* 74*  ALT 119* 111* 99*  ALKPHOS 268* 217* 172*  BILITOT 0.8 0.8 1.0  GFRNONAA >60 >60 >60  ANIONGAP 8 9 11  Hematology Recent Labs  Lab 09/03/20 0259 09/04/20 0330 09/05/20 0434  WBC 14.4* 16.4* 17.3*  RBC 3.40* 3.47* 3.25*  HGB 10.6* 10.8* 10.1*  HCT 33.1* 34.2* 32.7*  MCV 97.4 98.6 100.6*  MCH 31.2 31.1 31.1  MCHC 32.0 31.6 30.9  RDW 15.0 14.9 14.7  PLT 198 238 277    BNPNo results for input(s): BNP, PROBNP in the last 168 hours.   DDimer No results for input(s): DDIMER in the last 168 hours.   Radiology    DG Chest Port 1 View  Result Date: 09/05/2020 CLINICAL DATA:  Respiratory failure EXAM: PORTABLE CHEST 1 VIEW COMPARISON:  09/03/2020 FINDINGS: Support devices are stable. Right chest tube in place. No pneumothorax. Cardiomegaly, vascular congestion. Right lower lobe atelectasis or infiltrate with small right pleural effusion again noted, unchanged. Left base atelectasis. IMPRESSION: Support devices stable. No pneumothorax. Continued right  lower lobe atelectasis or infiltrate with small right effusion. Electronically Signed   By: Rolm Baptise M.D.   On: 09/05/2020 08:02   DG CHEST PORT 1 VIEW  Result Date: 09/03/2020 CLINICAL DATA:  Shortness of breath. EXAM: PORTABLE CHEST 1 VIEW COMPARISON:  September 03, 2020. FINDINGS: Stable cardiomegaly. Endotracheal and nasogastric tubes are unchanged in position. No pneumothorax is noted. Right chest tube is unchanged in position. Stable large right lower lobe opacity is noted consistent with pneumonia or atelectasis with associated pleural effusion. Minimal left basilar atelectasis is noted. Bony thorax is unremarkable. IMPRESSION: Stable support apparatus. Stable large right lower lobe opacity is noted consistent with pneumonia or atelectasis with associated pleural effusion. Electronically Signed   By: Marijo Conception M.D.   On: 09/03/2020 16:20    Cardiac Studies   Echocardiogram 08/30/20: 1. Left ventricular ejection fraction, by estimation, is 35 to 40%. The  left ventricle has moderately decreased function. The left ventricle  demonstrates global hypokinesis. The left ventricular internal cavity size  was moderately dilated. Left  ventricular diastolic parameters are indeterminate.  2. Right ventricular systolic function is mildly reduced. The right  ventricular size is moderately enlarged. There is mildly elevated  pulmonary artery systolic pressure.  3. Left atrial size was mildly dilated.  4. Right atrial size was moderately dilated.  5. The mitral valve is normal in structure. Mild mitral valve  regurgitation. No evidence of mitral stenosis.  6. The aortic valve has an indeterminant number of cusps. There is mild  calcification of the aortic valve. Aortic valve regurgitation is not  visualized. Mild aortic valve sclerosis is present, with no evidence of  aortic valve stenosis.  7. Aortic dilatation noted. There is mild to moderate dilatation of the  ascending aorta,  measuring 44 mm.  8. The inferior vena cava is dilated in size with <50% respiratory  variability, suggesting right atrial pressure of 15 mmHg.   Patient Profile     55 y.o. male with a PMH of HTN, nectrotizing myopathy, opioid use disorder on suboxone, ETOH abuse, low testosterone who is being followed by cardiology for the evaluation of new LV dysfunction/cardiomyopathy and atrial fibrillation.   Assessment & Plan    1. MRSA PNA/parapneumonia effusion s/p chest tube: patient presented with sepsis 2/2 MRSA PNA requiring pressors. He underwent chest tube placement for right pleural effusion/possible empyema with progressive PNA felt to be compatible with necrotizing PNA. He remains intubated and on IV antibiotics. BCx's have remained negative so TEE deferred. He underwent bronchoscopy yesterday for management of mucus plug. He continues to have intermittent fevers.  -  Continue management per primary team  2. Acute combined CHF/dilated cardiomyopathy: Echo this admission showed EF 35-40%, global hypokinesis, moderate LV dilation, indeterminate LV diastolic function, mildly reduced RV systolic function with moderate RV dilation, mild LAE, moderate RAE, mild MR, and 60m dilation of the ascending aorta. Etiology of cardiomyopathy remains unclear - stress induced vs ETOH induced, though cannot exclude ischemic cardiomyopathy. GDMT has been limited by intermittent hypotension; he remains on pressors. He was given IV lasix 483mx2 doses 09/04/20, though remained net +4412mn the past 24 hours and +6.3L this admission. If accurate, weight was 210lbs on admission, peaked at 241lbs 09/01/20, and is down to 223lbs today. CVP is 13 today.  - Will give additional IV lasix today - Hopeful to add GDMT as BP improves - thankfully Cr has remained stable  - Not a candidate for definitive ischemic evaluation at this time. Would be reasonable to repeat an echocardiogram in 3 months following initiation of GDMT and  abstinence from ETOH and if EF not improve, consider LHC at that time once recovered from present illness.  3. Atrial fibrillation: new diagnosis this admission. Rates stable in the 80s-90s overnight. He is on IV amiodarone given soft blood pressures requiring pressors. Likely driven by #1, however cannot exclude prior occurrences. He has been maintained on therapeutic lovenox as he remains intubated and sedated, and continues to require intermittent procedures - Continue IV amiodarone for rate/rhythm control - Continue therapeutic lovenox for stroke ppx - hopeful to transition to DOAC once tolerating po and no further procedures necessary.   4. HTN: currently with hypotension requiring pressors. Does not appear to have been on medications prior to admission - Anticipate management in the setting of #2  5. Aortic aneurysm: noted to have a 2m32mlation of the ascending aorta on echo this admission - Continue routine outpatient monitoring       For questions or updates, please contact [facility] consult [email or web address] for contact info under        Signed, KrisAbigail Butts-C  09/05/2020, 8:30 AM

## 2020-09-05 NOTE — Progress Notes (Signed)
Pharmacy Antibiotic Note  Joseph Ayala is a 55 y.o. male admitted on 08/27/2020 with pneumonia.  Pharmacy has been consulted for vancomycin dosing. Today, 09/05/2020: Day 8 full MRSA abx Tm 101.3, WBC up to 17.3, SCr remains WNL  Significant events: 2/23 CT: necrotizing PNA, possible empyema 2/24 s/p chest tube for large pleural effusion  2/25-2/26 intra-pleural TPA/Dornase  2/26 FOB for mucus plugs 2/28 Repeat CT pending  Plan: Continue vancomycin 2 gm IV q12 - F/u renal function, WBC, temp, culture data  Height: _0  (180.3 cm) Weight: 101.2 kg (223 lb 1.7 oz) IBW/kg (Calculated) : 75.3  Temp (24hrs), Avg:100.2 F (37.9 C), Min:99 F (37.2 C), Max:101.3 F (38.5 C)  Recent Labs  Lab 08/29/20 1045 08/30/20 0449 08/30/20 0749 08/30/20 1021 08/30/20 1309 08/31/20 1034 08/31/20 1231 09/01/20 0034 09/01/20 0346 09/01/20 1211 09/02/20 0549 09/03/20 0259 09/04/20 0330 09/05/20 0434  WBC  --    < >  --   --    < >  --    < >  --  12.5*  --  14.5* 14.4* 16.4* 17.3*  CREATININE  --    < >  --   --    < >  --   --   --  0.60* 0.45* 0.51* 0.64 0.59* 0.62  LATICACIDVEN 2.1*  --  2.0* 2.4*  --  2.0*  --  1.4  --   --   --   --   --   --   VANCOTROUGH  --   --   --   --   --   --   --   --   --  14*  --   --   --   --   VANCOPEAK  --   --   --   --   --   --   --   --  23*  --   --   --   --   --    < > = values in this interval not displayed.    Estimated Creatinine Clearance: 129.4 mL/min (by C-G formula based on SCr of 0.62 mg/dL).    Allergies  Allergen Reactions  . Vicodin [Hydrocodone-Acetaminophen] Nausea And Vomiting   Antimicrobials this admission:  2/20 CTX >>2/20 2/20 Azithro >>2/23 2/20 vanc>> 2/20 zosyn>>2/23  Dose adjustments this admission:  2/24 VT 14, VP 23: AUC 443, Cmax 24.3, Cmin 13.4 on 1750 q12. Incr 2 gm q12, AUC 505. 27.7/15.4 2/28 Vanc trough = 17.  Continue Vanc 2g IV Q12h.  Microbiology results:  2/19 SARS CoV2: negative 2/20 Acid  Fast Culture (BAL)x3: neg x1 2/20 BAL: few MRSA (vanc MIC < 0.5) F 2/20 MRSA PCR: positive 2/20 BCx: ngF 2/20 HIV NR 2/21 Strep pneumo UrAg: neg 2/21 legionella neg 2/24 pleural fluid: ngtd 2/26 bronch washings: abundant Staph aureus 2/26 bronch washings fungal cx: sent   Thank you for allowing pharmacy to be a part of this patient's care.  Gretta Arab PharmD, BCPS Clinical Pharmacist [facility] main pharmacy [phone] 09/05/2020 10:28 AM

## 2020-09-05 NOTE — TOC Progression Note (Signed)
Transition of Care [facility]) - Progression Note    Patient Details  Name: Joseph Ayala MRN: [id number] Date of Birth: 11-17-1965  [facility] CM/SW Contact  Golda Acre, RN Phone Number: 09/05/2020, 7:58 AM  Clinical Narrative:    55 year old male on Suboxone for necrotizing myopathy who presents with acute hypoxemic respiratory failure secondary to community-acquired pneumonia/aspiration. PCCM initially consulted for evaluation for bronchoscopy for possible mucous plugging. Transferred to ICU after worsening respiratory failure and mental status requiring intubation on 2/21.  Past Medical History:  Hypertension, necrotizing myopathy, opioid use on Suboxone  Significant Hospital Events:  2/19 Arrived to ED for cough and chest pain x 1 week 2/20 Admitted to [facility] in early am. Intubated. Bronch 2/22 Worsening respiratory status and vasopressor requirement. Agitation on vent 2/23 Improving pressor requirement 2/24 Off pressors. Right chest tube placement. Propofol changed versed as TRG increased 2/25 about 650 out of chest tube since insertion. Stable vent requirements. Sedation and agitation a challenge. Still having fever. Still agitated. Adding librium .  TPA into chest tube  Consults:  TRH PCCM  Procedures:  2/24 right chest tube>>> 2/26 bronchoscopy for mucous plugging PLAN: pt is unstable at this time unable to determine plan.   Expected Discharge Plan: Home/Self Care Barriers to Discharge: Continued Medical Work up  Expected Discharge Plan and Services Expected Discharge Plan: Home/Self Care   Discharge Planning Services: CM Consult   Living arrangements for the past 2 months: Single Family Home                                       Social Determinants of Health (SDOH) Interventions    Readmission Risk Interventions No flowsheet data found.

## 2020-09-06 ENCOUNTER — Inpatient Hospital Stay (HOSPITAL_COMMUNITY): Payer: Medicare HMO

## 2020-09-06 DIAGNOSIS — I712 Thoracic aortic aneurysm, without rupture, unspecified: Secondary | ICD-10-CM | POA: Diagnosis present

## 2020-09-06 DIAGNOSIS — I480 Paroxysmal atrial fibrillation: Secondary | ICD-10-CM | POA: Diagnosis not present

## 2020-09-06 DIAGNOSIS — J90 Pleural effusion, not elsewhere classified: Secondary | ICD-10-CM

## 2020-09-06 DIAGNOSIS — I5041 Acute combined systolic (congestive) and diastolic (congestive) heart failure: Secondary | ICD-10-CM | POA: Diagnosis not present

## 2020-09-06 DIAGNOSIS — J189 Pneumonia, unspecified organism: Secondary | ICD-10-CM | POA: Diagnosis not present

## 2020-09-06 DIAGNOSIS — F1190 Opioid use, unspecified, uncomplicated: Secondary | ICD-10-CM | POA: Diagnosis not present

## 2020-09-06 DIAGNOSIS — J9601 Acute respiratory failure with hypoxia: Secondary | ICD-10-CM | POA: Diagnosis not present

## 2020-09-06 LAB — CBC WITH DIFFERENTIAL/PLATELET
Abs Immature Granulocytes: 1.1 10*3/uL — ABNORMAL HIGH (ref 0.00–0.07)
Basophils Absolute: 0.1 10*3/uL (ref 0.0–0.1)
Basophils Relative: 1 %
Eosinophils Absolute: 0.1 10*3/uL (ref 0.0–0.5)
Eosinophils Relative: 1 %
HCT: 32.1 % — ABNORMAL LOW (ref 39.0–52.0)
Hemoglobin: 10 g/dL — ABNORMAL LOW (ref 13.0–17.0)
Immature Granulocytes: 6 %
Lymphocytes Relative: 4 %
Lymphs Abs: 0.8 10*3/uL (ref 0.7–4.0)
MCH: 31.3 pg (ref 26.0–34.0)
MCHC: 31.2 g/dL (ref 30.0–36.0)
MCV: 100.3 fL — ABNORMAL HIGH (ref 80.0–100.0)
Monocytes Absolute: 0.7 10*3/uL (ref 0.1–1.0)
Monocytes Relative: 4 %
Neutro Abs: 15.8 10*3/uL — ABNORMAL HIGH (ref 1.7–7.7)
Neutrophils Relative %: 84 %
Platelets: 290 10*3/uL (ref 150–400)
RBC: 3.2 MIL/uL — ABNORMAL LOW (ref 4.22–5.81)
RDW: 14.6 % (ref 11.5–15.5)
WBC: 18.6 10*3/uL — ABNORMAL HIGH (ref 4.0–10.5)
nRBC: 0 % (ref 0.0–0.2)

## 2020-09-06 LAB — GLUCOSE, CAPILLARY
Glucose-Capillary: 101 mg/dL — ABNORMAL HIGH (ref 70–99)
Glucose-Capillary: 102 mg/dL — ABNORMAL HIGH (ref 70–99)
Glucose-Capillary: 110 mg/dL — ABNORMAL HIGH (ref 70–99)
Glucose-Capillary: 123 mg/dL — ABNORMAL HIGH (ref 70–99)
Glucose-Capillary: 97 mg/dL (ref 70–99)
Glucose-Capillary: 98 mg/dL (ref 70–99)

## 2020-09-06 LAB — COMPREHENSIVE METABOLIC PANEL
ALT: 102 U/L — ABNORMAL HIGH (ref 0–44)
AST: 69 U/L — ABNORMAL HIGH (ref 15–41)
Albumin: 1.9 g/dL — ABNORMAL LOW (ref 3.5–5.0)
Alkaline Phosphatase: 179 U/L — ABNORMAL HIGH (ref 38–126)
Anion gap: 8 (ref 5–15)
BUN: 24 mg/dL — ABNORMAL HIGH (ref 6–20)
CO2: 26 mmol/L (ref 22–32)
Calcium: 8.2 mg/dL — ABNORMAL LOW (ref 8.9–10.3)
Chloride: 110 mmol/L (ref 98–111)
Creatinine, Ser: 0.55 mg/dL — ABNORMAL LOW (ref 0.61–1.24)
GFR, Estimated: >60 mL/min (ref >60)
Glucose, Bld: 114 mg/dL — ABNORMAL HIGH (ref 70–99)
Potassium: 3.2 mmol/L — ABNORMAL LOW (ref 3.5–5.1)
Sodium: 144 mmol/L (ref 135–145)
Total Bilirubin: 0.9 mg/dL (ref 0.3–1.2)
Total Protein: 5.1 g/dL — ABNORMAL LOW (ref 6.5–8.1)

## 2020-09-06 LAB — CULTURE, RESPIRATORY W GRAM STAIN

## 2020-09-06 MED ORDER — POTASSIUM CHLORIDE 20 MEQ PO PACK
20.0000 meq | PACK | ORAL | Status: AC
  Administered 2020-09-06 (×3): 20 meq
  Filled 2020-09-06 (×3): qty 1

## 2020-09-06 MED ORDER — PROSOURCE TF PO LIQD
45.0000 mL | Freq: Every day | ORAL | Status: DC
  Administered 2020-09-06 – 2020-09-23 (×18): 45 mL
  Filled 2020-09-06 (×14): qty 45

## 2020-09-06 MED ORDER — FUROSEMIDE 10 MG/ML IJ SOLN
80.0000 mg | Freq: Two times a day (BID) | INTRAMUSCULAR | Status: DC
  Administered 2020-09-06: 80 mg via INTRAVENOUS
  Filled 2020-09-06 (×2): qty 8

## 2020-09-06 MED ORDER — MIDAZOLAM 50MG/50ML (1MG/ML) PREMIX INFUSION
0.5000 mg/h | INTRAVENOUS | Status: DC
  Administered 2020-09-07: 7 mg/h via INTRAVENOUS
  Administered 2020-09-07: 8 mg/h via INTRAVENOUS
  Filled 2020-09-06: qty 50

## 2020-09-06 MED ORDER — POTASSIUM CHLORIDE 20 MEQ PO PACK
20.0000 meq | PACK | ORAL | Status: DC
  Administered 2020-09-06: 20 meq
  Filled 2020-09-06: qty 1

## 2020-09-06 MED ORDER — POTASSIUM CHLORIDE CRYS ER 20 MEQ PO TBCR: 40.0000 meq | EXTENDED_RELEASE_TABLET | Freq: Once | ORAL | Status: DC

## 2020-09-06 MED ORDER — FUROSEMIDE 10 MG/ML IJ SOLN: 40.0000 mg | Freq: Once | INTRAMUSCULAR | Status: DC

## 2020-09-06 MED ORDER — POTASSIUM CHLORIDE CRYS ER 20 MEQ PO TBCR: 40.0000 meq | EXTENDED_RELEASE_TABLET | ORAL | Status: DC

## 2020-09-06 MED ORDER — FUROSEMIDE 10 MG/ML IJ SOLN
40.0000 mg | Freq: Once | INTRAMUSCULAR | Status: AC
  Administered 2020-09-06: 40 mg via INTRAVENOUS
  Filled 2020-09-06: qty 4

## 2020-09-06 MED ORDER — PIVOT 1.5 CAL PO LIQD
1000.0000 mL | ORAL | Status: DC
  Administered 2020-09-06 – 2020-09-23 (×19): 1000 mL
  Filled 2020-09-06 (×21): qty 1000

## 2020-09-06 MED ORDER — POTASSIUM CHLORIDE 10 MEQ/50ML IV SOLN
10.0000 meq | INTRAVENOUS | Status: AC
  Administered 2020-09-06 (×4): 10 meq via INTRAVENOUS
  Filled 2020-09-06 (×4): qty 50

## 2020-09-06 NOTE — Progress Notes (Signed)
CPT held due to agitation.

## 2020-09-06 NOTE — Progress Notes (Addendum)
Wife updated at bedside in detail on plan of care.  Noted reduction of IV sedation / pain medications, remains in SR, off vasopressors, little drainage from chest tube, possibility of residual loculated area vs necrotic PNA, weaning on vent and diuresis plan. Will follow closely. CXR for am and will make decision regarding removal of chest tube.    Joseph Brim, MSN, APRN, NP-C, AGACNP-BC [facility] 09/06/2020, 3:10 PM   Please see [email or web address] for pager details.   From 7A-7P if no response, please call [phone] After hours, please call ELink [phone]

## 2020-09-06 NOTE — Progress Notes (Signed)
Progress Note  Patient Name: Joseph Ayala Date of Encounter: 09/06/2020  [facility] Cardiologist: Donato Heinz, MD   Subjective   Intubated and sedated, no family at bedside. RN reports weaning off sedation.   Inpatient Medications    Scheduled Meds: . acetylcysteine  2 mL Nebulization BID  . chlordiazePOXIDE  25 mg Per Tube QID  . chlorhexidine gluconate (MEDLINE KIT)  15 mL Mouth Rinse BID  . Chlorhexidine Gluconate Cloth  6 each Topical Daily  . docusate  100 mg Per Tube BID  . enoxaparin (LOVENOX) injection  100 mg Subcutaneous Q12H  . feeding supplement (PIVOT 1.5 CAL)  1,000 mL Per Tube Q24H  . folic acid  1 mg Per Tube Daily  . mouth rinse  15 mL Mouth Rinse 10 times per day  . multivitamin  15 mL Per Tube Daily  . oxyCODONE  5 mg Per Tube Q6H  . pantoprazole sodium  40 mg Per Tube Daily  . polyethylene glycol  17 g Per Tube Daily  . potassium chloride  20 mEq Per Tube Q4H  . sodium chloride flush  10-40 mL Intracatheter Q12H  . thiamine  100 mg Per Tube Daily   Continuous Infusions: . sodium chloride Stopped (09/05/20 2352)  . amiodarone 30 mg/hr (09/06/20 0657)  . dextrose 5% lactated ringers 40 mL/hr at 09/05/20 1737  . HYDROmorphone 8 mg/hr (09/06/20 0657)  . midazolam 4 mg/hr (09/06/20 0831)  . phenylephrine (NEO-SYNEPHRINE) Adult infusion Stopped (09/05/20 0252)  . potassium chloride 10 mEq (09/06/20 0832)  . vancomycin Stopped (09/06/20 0153)   PRN Meds: acetaminophen (TYLENOL) oral liquid 160 mg/5 mL **OR** acetaminophen, albuterol, HYDROmorphone, ibuprofen, midazolam, sodium chloride flush   Vital Signs    Vitals:   09/06/20 0327 09/06/20 0400 09/06/20 0500 09/06/20 0600  BP: (!) 108/54     Pulse: 96 98 89 89  Resp: (!) 24 (!) 24 (!) 24 (!) 24  Temp:  98.3 F (36.8 C)    TempSrc:  Axillary    SpO2: 92% 94% 91% 96%  Weight:   101.2 kg   Height:        Intake/Output Summary (Last 24 hours) at 09/06/2020 0834 Last data filed at  09/06/2020 0657 Gross per 24 hour  Intake 3021.34 ml  Output 2900 ml  Net 121.34 ml   Last 3 Weights 09/06/2020 09/05/2020 09/04/2020  Weight (lbs) 223 lb 1.7 oz 223 lb 1.7 oz 225 lb 12 oz  Weight (kg) 101.2 kg 101.2 kg 102.4 kg      Telemetry    Sinus rhythm with rates in the 80s-90s - Personally Reviewed  ECG    No new tracings - Personally Reviewed  Physical Exam   GEN: Critically ill - intubated and sedated   Neck: No JVD Cardiac: RRR, no murmurs, rubs, or gallops.  Respiratory: coarse breath sounds with scattered rhonchi GI: Soft, nontender, non-distended  MS: 2+ LE edema; No deformity. Neuro:  Nonfocal  Psych: Normal affect   Labs    High Sensitivity Troponin:   Recent Labs  Lab 08/27/20 1953 08/28/20 0704 08/30/20 1646 08/30/20 2326  TROPONINIHS 9 17 391* 377*      Chemistry Recent Labs  Lab 09/04/20 0330 09/05/20 0434 09/06/20 0505  NA 146* 145 144  K 3.3* 3.4* 3.2*  CL 111 109 110  CO2 _0 GLUCOSE 114* 279* 114*  BUN 27* 29* 24*  CREATININE 0.59* 0.62 0.55*  CALCIUM 8.1* 8.2* 8.2*  PROT  5.1* 5.0* 5.1*  ALBUMIN 1.8* 1.9* 1.9*  AST 77* 74* 69*  ALT 111* 99* 102*  ALKPHOS 217* 172* 179*  BILITOT 0.8 1.0 0.9  GFRNONAA >60 >60 >60  ANIONGAP _0 Hematology Recent Labs  Lab 09/04/20 0330 09/05/20 0434 09/06/20 0505  WBC 16.4* 17.3* 18.6*  RBC 3.47* 3.25* 3.20*  HGB 10.8* 10.1* 10.0*  HCT 34.2* 32.7* 32.1*  MCV 98.6 100.6* 100.3*  MCH 31.1 31.1 31.3  MCHC 31.6 30.9 31.2  RDW 14.9 14.7 14.6  PLT 238 277 290    BNPNo results for input(s): BNP, PROBNP in the last 168 hours.   DDimer No results for input(s): DDIMER in the last 168 hours.   Radiology    CT CHEST WO CONTRAST  Result Date: 09/05/2020 CLINICAL DATA:  Respiratory failure, necrotizing pneumonia of right lung with associated empyema and status post right-sided chest tube placement with administration of intrapleural lytic therapy. EXAM: CT CHEST WITHOUT CONTRAST  TECHNIQUE: Multidetector CT imaging of the chest was performed following the standard protocol without IV contrast. COMPARISON:  Prior CT studies on 08/31/2020 and 08/27/2020. FINDINGS: Cardiovascular: Stable heart size and coronary artery calcifications. Stable mild dilatation of the ascending thoracic aorta which measures up to approximately 4.2 cm in greatest diameter. Left jugular central line present extending into the upper SVC. Mediastinum/Nodes: Scattered small mediastinal lymph nodes appears stable and likely are reactive. Lungs/Pleura: Left lateral chest tube enters the lower pleural space and ascends in the posterior pleural space. It is somewhat difficult to separate pleural abnormality from adjacent parenchymal abnormality without the presence of IV contrast. However, there does not appear to be a large amount of residual pleural fluid present and most of the low-density abnormality in the right mid to lower chest is felt to represent necrotic and consolidated right lower lobe and right middle lobe. There is still some fluid component likely present in the pleural space posteriorly, mainly superior to the tip of the indwelling chest tube. No pneumothorax. Atelectasis present involving the posterior left lower lobe. Upper Abdomen: No acute abnormality. Musculoskeletal: No chest wall mass or suspicious bone lesions identified. IMPRESSION: 1. No large amount of residual pleural fluid present with most of the low-density abnormality in the right mid to lower chest felt to represent necrotic and consolidated right lower lobe and right middle lobe. There is still some fluid component likely present in the pleural space posteriorly, mainly superior to the tip of the indwelling chest tube. It is difficult to differentiate pleural from parenchymal abnormality due to similar densities by CT and any follow-up imaging of the chest by CT would benefit from administration of IV contrast, if possible. 2. Stable mild  dilatation of the ascending thoracic aorta which measures up to approximately 4.2 cm in greatest diameter. Electronically Signed   By: Aletta Edouard M.D.   On: 09/05/2020 15:03   DG Chest Port 1 View  Result Date: 09/06/2020 CLINICAL DATA:  Follow-up pneumonia EXAM: PORTABLE CHEST 1 VIEW COMPARISON:  09/05/2020 FINDINGS: Endotracheal tube, gastric catheter and left jugular central line are again seen and stable. Cardiac shadow is stable. Persistent consolidation in the right base is noted. Chest tube is noted on the right without evidence of pneumothorax. Left lung remains clear. No bony abnormality is noted. IMPRESSION: Persistent right basilar consolidation.  No pneumothorax is noted. Tubes and lines as described. Electronically Signed   By: Inez Catalina M.D.   On: 09/06/2020 08:24   DG Chest  Port 1 View  Result Date: 09/05/2020 CLINICAL DATA:  Respiratory failure EXAM: PORTABLE CHEST 1 VIEW COMPARISON:  09/03/2020 FINDINGS: Support devices are stable. Right chest tube in place. No pneumothorax. Cardiomegaly, vascular congestion. Right lower lobe atelectasis or infiltrate with small right pleural effusion again noted, unchanged. Left base atelectasis. IMPRESSION: Support devices stable. No pneumothorax. Continued right lower lobe atelectasis or infiltrate with small right effusion. Electronically Signed   By: Rolm Baptise M.D.   On: 09/05/2020 08:02    Cardiac Studies   Echocardiogram 08/30/20: 1. Left ventricular ejection fraction, by estimation, is 35 to 40%. The  left ventricle has moderately decreased function. The left ventricle  demonstrates global hypokinesis. The left ventricular internal cavity size  was moderately dilated. Left  ventricular diastolic parameters are indeterminate.  2. Right ventricular systolic function is mildly reduced. The right  ventricular size is moderately enlarged. There is mildly elevated  pulmonary artery systolic pressure.  3. Left atrial size was mildly  dilated.  4. Right atrial size was moderately dilated.  5. The mitral valve is normal in structure. Mild mitral valve  regurgitation. No evidence of mitral stenosis.  6. The aortic valve has an indeterminant number of cusps. There is mild  calcification of the aortic valve. Aortic valve regurgitation is not  visualized. Mild aortic valve sclerosis is present, with no evidence of  aortic valve stenosis.  7. Aortic dilatation noted. There is mild to moderate dilatation of the  ascending aorta, measuring 44 mm.  8. The inferior vena cava is dilated in size with <50% respiratory  variability, suggesting right atrial pressure of 15 mmHg.    Patient Profile    55 y.o. male with a PMH of HTN, nectrotizing myopathy, opioid use disorder on suboxone, ETOH abuse, low testosteronewho is being followed by cardiology for the evaluation of new LV dysfunction/cardiomyopathy and atrial fibrillation.    Assessment & Plan    1. MRSA PNA/parapneumonia effusion s/p chest tube: patient presented with sepsis 2/2 MRSA PNA requiring pressors. He underwent chest tube placement for right pleural effusion/possible empyema with progressive PNA felt to be compatible with necrotizing PNA. He remains intubated and on IV antibiotics. BCx's have remained negative so TEE deferred. He underwent bronchoscopy 2/27 for management of mucus plug. He continues to have intermittent fevers.  - Continue management per primary team  2. Acute combined CHF/dilated cardiomyopathy: Echo this admission showed EF 35-40%, global hypokinesis, moderate LV dilation, indeterminate LV diastolic function, mildly reduced RV systolic function with moderate RV dilation, mild LAE, moderate RAE, mild MR, and 1m dilation of the ascending aorta. Etiology of cardiomyopathy remains unclear - stress induced vs ETOH induced, though cannot exclude ischemic cardiomyopathy. GDMT has been limited by intermittent hypotension; he remains on pressors. He  has been diuresed with IV lasix 415mBID for the past several days, though remained net +12144mn the past 24 hours and +6.4L this admission. If accurate, weight was 210lbs on admission, peaked at 241lbs 09/01/20, and is down to 223lbs today. CVP is 13 today. BP has perked up a bit - Will give additional IV lasix today - will trial 42m42mD given marginal UOP over the past couple days - Hopeful to add GDMT as BP improves - thankfully Cr has remained stable  - Not a candidate for definitive ischemic evaluation at this time. Would be reasonable to repeat an echocardiogram in 3 months following initiation of GDMT and abstinence from ETOH and if EF not improve, consider LHC at that  time once recovered from present illness. - Continue to monitor strict I&Os and daily weights - Continue to monitor electrolytes closely and replete as needed to maintain K >4, Mg <2 - he is receiving IV K 10 mEq x4 runs this morning as well as 20 mEp po x2. Will order additional 40 mEq po for the afternoon.   3. Atrial fibrillation: new diagnosis this admission. Rates elevated to 100s overnight in the setting of a fever, generally in the 80s-90s. He is on IV amiodarone given soft blood pressures requiring pressors. Likely driven by #1, however cannot exclude prior occurrences. He has been maintained on therapeutic lovenox as he remains intubated and sedated, and continues to require intermittent procedures. No recurrence of atrial fibrillation over the past 48 hours.  - Continue IV amiodarone for rate/rhythm control - Continue therapeutic lovenox for stroke ppx - hopeful to transition to DOAC once tolerating po and no further procedures necessary.   4. HTN: BP improving, now off pressors. Does not appear to have been on medications prior to admission - Anticipate management in the setting of #2  5. Aortic aneurysm: noted to have a 61m dilation of the ascending aorta on echo this admission - Continue routine outpatient  monitoring      For questions or updates, please contact [facility] [place]Please consult [email or web address] for contact info under        Signed, KAbigail Butts PA-C  09/06/2020, 8:34 AM

## 2020-09-06 NOTE — Progress Notes (Signed)
Nutrition Follow-up  RD working remotely.  DOCUMENTATION CODES:   Not applicable  INTERVENTION:  - will adjust TF regimen: Pivot 1.5 @ 30 ml/hr to advance by 10 ml every 8 hours to reach goal rate of 60 ml/hr with 45 ml Prosource TF once/day. - at goal rate, this regimen will provide 2200 kcal, 146 grams protein, and 1093 ml free water.  - free water flush, if desired, to be per CCM.    NUTRITION DIAGNOSIS:   Inadequate oral intake related to inability to eat as evidenced by NPO status. -ongoing  GOAL:   Patient will meet greater than or equal to 90% of their needs -met with TF regimen  MONITOR:   Vent status,TF tolerance,Labs,Weight trends  ASSESSMENT:   55 year old male on Suboxone for necrotizing myopathy who presents with acute hypoxemic respiratory failure secondary to community-acquired pneumonia/aspiration. PCCM initially consulted for evaluation for bronchoscopy for possible mucous plugging. Transferred to ICU after worsening respiratory failure and mental status requiring intubation on 2/21.  Significant Events: 2/19- admission for CAP 2/20- intubation; OGT placement 2/21- initial RD assessment 2/22- initiation of trickle TF 2/23- off pressors 2/24- transitioned from propofol to versed d/t rising TGs 2/26- mucus plugging   Patient remains intubated with OGT in place. He continues with trickle rate TF of Pivot 1.5 @ 20 ml/hr which is providing 720 kcal, 45 grams protein, and 364 ml free water.  CCM ok with beginning to advance TF to goal rate.   Re-estimated kcal need based on admission weight of 95.3 kg. Weight has been significantly up and flow sheet documentation indicates moderate pitting edema to all extremities.     Patient is currently intubated on ventilator support MV: 9.3 L/min Temp (24hrs), Avg:99.5 F (37.5 C), Min:98.3 F (36.8 C), Max:100.9 F (38.3 C) Propofol: none  Labs reviewed; CBGs: 110, 98, 97 mg/dl, K: 3.2 mmol/l, BUN: 24 mg/dl,  creatinine: 0.55 mg/dl, Ca: 8.2 mg/dl, LFTs elevated.  Medications reviewed; 100 mg colace BID, 1 mg folvite/day, 40 mg IV lasix x1 dose 3/1, 15 ml liquid multivitamin/day, 40 mg protonix/day, 17 g miralax/day, 20 mEq Klor-Con x2 doses 3/1, 40 mEq Klor-Con x1 dose 3/1, 10 mEq IV KCl x4 runs 3/1, 100 mg thiamine/day.  Drips; amio @ 30 mg/hr, versed @ 8 mg/hr,    Diet Order:   Diet Order            Diet NPO time specified  Diet effective now                 EDUCATION NEEDS:   No education needs have been identified at this time  Skin:  Skin Assessment: Reviewed RN Assessment  Last BM:  3/1 (type 6 x1)  Height:   Ht Readings from Last 1 Encounters:  09/06/20 '5\' 11"'  (1.803 m)    Weight:   Wt Readings from Last 1 Encounters:  09/06/20 101.2 kg    Estimated Nutritional Needs:  Kcal:  2279 kcal Protein:  140-155g Fluid:  >/= 2 L/day      Jarome Matin, MS, RD, LDN, CNSC Inpatient Clinical Dietitian RD pager # available in AMION  After hours/weekend pager # available in [facility]

## 2020-09-06 NOTE — Progress Notes (Signed)
NAME:  Joseph Ayala, MRN:  [id number], DOB:  [birth year], LOS: 9 ADMISSION DATE:  08/27/2020, CONSULTATION DATE:  08/28/20 REFERRING MD:  Darliss Cheney, MD CHIEF COMPLAINT:  Acute hypoxemic respiratory failure  Brief History:  55 year old male on Suboxone for necrotizing myopathy admitted 2/19 with acute hypoxemic respiratory failure secondary to MRSA community-acquired pneumonia / aspiration. PCCM initially consulted for evaluation for bronchoscopy for possible mucous plugging. Transferred to ICU after worsening respiratory failure and mental status requiring intubation on 2/21. ICU course complicated by hypotension requiring pressors, chest tube placement for empyema s/p intrapleural lytics.   Past Medical History:  Hypertension, necrotizing myopathy, opioid use on Suboxone  Significant Hospital Events:  2/19 Arrived to ED for cough and chest pain x 1 week 2/20 Admitted to [facility] in early am. Intubated. Bronch 2/22 Worsening respiratory status and vasopressor requirement. Agitation on vent 2/23 Improving pressor requirement 2/24 Off pressors. Right chest tube placement. Propofol changed versed as TRG increased 2/25 About 650 out of CT since insertion. Stable vent requirements. Sedation and agitation a challenge / librium added. Fever. TPA into chest tube 2/26 FOB for mucus plugging 2/27 Briefly on neo overnight / thought sedation related  2/28 Repeat CT with little residual pleural fluid, largely consolidated lung. On vent. Oral oxy added.  3/01 Ceiling of IV gtt's reduced, weaning on PSV 10/5  Consults:  TRH PCCM  Procedures:  ETT 2/21 >>  R Radial Aline 2/20 >> L IJ TLC 2/21 >> R CT 2/24 >>  Significant Diagnostic Tests:   CTA Chest 2/19 >> right sided consolidation and ground glass opacities in the right and middle lobe. Background centrilobular emphysema present. No pulmonary embolism  CT CAP 2/24 >> Interval enlargement of right pleural effusion with some loculation.  Progression of right airspace disease with cavitation in right middle/lower lobe. Anasarca  CT Chest w/o 2/28 >> no large amount of residual pleural fluid present with most of the low-density abnormality in the right mid to lower chest felt to represent necrotic and consolidated RLL, RML. There is still some fluid component in the posterior pleural space superior to the tip of the indwelling chest tube  Micro Data:  Blood culture 2/19 >> negative  BAL resp cx 2/20 >> MRSA BAL fungal cx 2/20 >> no fungal elements observed on stain >> BAL AFB cx 2/20 >> no AFB on smear >>  Urine strep ag 2/21 >> neg Urine legionella ag 2/21 >> neg Pleural fluid 2/24 >> ? If sent  BAL 2/26 >> MRSA   Antimicrobials:  Ceftriaxone 2/20 >>2/21 Azithro 2/20 >> 2/21 Vanc 2/21 >> Zosyn 2/21 >> 2/23  Interim History / Subjective:  Tmax 100.9 / WBC 18.6 Pt weaning on PSV 10/5, 40% Sedation reduced to versed 54m/hr, dilaudid 428mhr No drainage from chest tube  Glucose range 90-125 (?279 reading accuracy) I/O 2.9L UOP, +12195mn last 24 hours   Objective   Blood pressure (!) 108/54, pulse 89, temperature 98.3 F (36.8 C), temperature source Axillary, resp. rate (!) 24, height _0  (1.803 m), weight 101.2 kg, SpO2 96 %. CVP:  [12 mmHg-13 mmHg] 13 mmHg  Vent Mode: CPAP;PSV FiO2 (%):  [40 %] 40 % Set Rate:  [24 bmp] 24 bmp Vt Set:  [600 mL] 600 mL PEEP:  [5 cmH20] 5 cmH20 Pressure Support:  [10 cmH20] 10 cmH20 Plateau Pressure:  [19 cmH20-24 cmH20] 21 cmH20   Intake/Output Summary (Last 24 hours) at 09/06/2020 0819 Last data filed at 09/06/2020 0657 Gross per  24 hour  Intake 3021.34 ml  Output 2900 ml  Net 121.34 ml   Filed Weights   09/04/20 0500 09/05/20 0500 09/06/20 0500  Weight: 102.4 kg 101.2 kg 101.2 kg   Physical Exam: General: critically ill appearing adult male lying in bed on vent in NAD HEENT: MM pink/moist, ETT with pink-yellow secretions in suction tubing, anicteric Neuro: sedate CV:  s1s2 RRR, no m/r/g PULM: non-labored on vent, coarse rhonchi on right, bronchial breath sounds on R, clear on left anterior  GI: soft, bsx4 active  Extremities: warm/dry, 1+ BLE pitting edema  Skin: no rashes or lesions  PCXR 3/1 >> images personally reviewed, rotated, ETT / CT in good position, cardiomegaly, significant right airspace opacities   Resolved Hospital Problem list   Elevated LFTs AKI Aspiration pneumonia/pneumonitis in setting of altered mental status Septic shock (off pressors as of 2/24) Cardiogenic shock   Assessment & Plan:   Acute hypoxemic respiratory failure secondary to MRSA PNA with Effusion   Parapneumonic Effusion versus Empyema  Mucus Plugging  S/p chest tube placement 2/24, tPA 2/25, 2/26 -PRVC 8cc/kg as rest mode  -Daily SBT / WUA -continue vancomycin  -chest PT via bed  -mucomyst D2/2 -CT chest noted as above, largely consolidated lung -repeat evening dose lasix with KCL   Septic Shock secondary to MRSA PNA Additional sedation effect  -shock resolved, off pressors since 2/28  -continue vancomycin   New onset heart failure with ejection fraction of 35 to 40% New onset atrial flutter/SVT secondary to sepsis Demand ischemia -amiodarone IV  -discontinue D5LR  -full dose lovenox   Acute Metabolic Encephalopathy Significant alcohol use and narcotic dependence Concern for withdrawal and sepsis -hold home suboxone  -continue librium, oxycodone -reduce ceiling of dilaudid, versed  -RASS goal -1 to -2  Hypokalemia  -monitor, replace as indicated.  KCL 3/1 replaced + additional with evening diuresis   Moderate Protein Calorie Malnutrition  -TF   Elevated LFT's -follow trend   Macrocytic Anemia  -monitor CBC  -transfuse per protocol  -folate, MVI, thiamine   Best practice (evaluated daily)  Diet: TF Pain/Anxiety/Delirium protocol (if indicated): PAD goal -1 to -2 VAP protocol (if indicated): Yes DVT prophylaxis: Lovenox, treatment  dose GI prophylaxis: Protonix Glucose control: CBG q4h Mobility: As tolerated Disposition: ICU  Goals of Care:  Last date of multidisciplinary goals of care discussion:  Family and staff present:  Summary of discussion:  Follow up goals of care discussion due:3/7 Code Status: Full code. Confirmed 2/28  Will update wife on arrival 3/1.   Critical Care Time: 23 minutes   Noe Gens, MSN, APRN, NP-C, AGACNP-BC [facility] 09/06/2020, 8:19 AM   Please see [email or web address] for pager details.   From 7A-7P if no response, please call [phone] After hours, please call ELink [phone]

## 2020-09-06 NOTE — Progress Notes (Signed)
K+ 3.2 replaced per eLink electrolyte protocol. Creatinine 0.55, GFR >60.

## 2020-09-06 NOTE — Progress Notes (Signed)
Wife request that [name] not be permitted to visit Joseph Ayala. As patient is intubated at this time and unable to verbalize his wishes.

## 2020-09-06 NOTE — Progress Notes (Signed)
eLink Physician-Brief Progress Note Patient Name: Joseph Ayala DOB: 1966/01/04 MRN: [id number]   Date of Service  09/06/2020  HPI/Events of Note  Agitation   eICU Interventions  Plan: 1. Increase ceiling on Versed IV infusion to 10 mg/hour.      Intervention Category Major Interventions: Delirium, psychosis, severe agitation - evaluation and management  Sommer,Steven Eugene 09/06/2020, 9:57 PM

## 2020-09-07 ENCOUNTER — Inpatient Hospital Stay (HOSPITAL_COMMUNITY): Payer: Medicare HMO

## 2020-09-07 DIAGNOSIS — J189 Pneumonia, unspecified organism: Secondary | ICD-10-CM | POA: Diagnosis not present

## 2020-09-07 DIAGNOSIS — J90 Pleural effusion, not elsewhere classified: Secondary | ICD-10-CM | POA: Diagnosis not present

## 2020-09-07 DIAGNOSIS — A4102 Sepsis due to Methicillin resistant Staphylococcus aureus: Principal | ICD-10-CM

## 2020-09-07 DIAGNOSIS — I5041 Acute combined systolic (congestive) and diastolic (congestive) heart failure: Secondary | ICD-10-CM | POA: Diagnosis not present

## 2020-09-07 DIAGNOSIS — J15212 Pneumonia due to Methicillin resistant Staphylococcus aureus: Secondary | ICD-10-CM

## 2020-09-07 DIAGNOSIS — I480 Paroxysmal atrial fibrillation: Secondary | ICD-10-CM | POA: Diagnosis not present

## 2020-09-07 DIAGNOSIS — J9601 Acute respiratory failure with hypoxia: Secondary | ICD-10-CM | POA: Diagnosis not present

## 2020-09-07 DIAGNOSIS — F1190 Opioid use, unspecified, uncomplicated: Secondary | ICD-10-CM | POA: Diagnosis not present

## 2020-09-07 LAB — BASIC METABOLIC PANEL
Anion gap: 8 (ref 5–15)
BUN: 24 mg/dL — ABNORMAL HIGH (ref 6–20)
CO2: 28 mmol/L (ref 22–32)
Calcium: 8.1 mg/dL — ABNORMAL LOW (ref 8.9–10.3)
Chloride: 109 mmol/L (ref 98–111)
Creatinine, Ser: 0.66 mg/dL (ref 0.61–1.24)
GFR, Estimated: >60 mL/min (ref >60)
Glucose, Bld: 114 mg/dL — ABNORMAL HIGH (ref 70–99)
Potassium: 3.3 mmol/L — ABNORMAL LOW (ref 3.5–5.1)
Sodium: 145 mmol/L (ref 135–145)

## 2020-09-07 LAB — CBC
HCT: 29.7 % — ABNORMAL LOW (ref 39.0–52.0)
Hemoglobin: 9.4 g/dL — ABNORMAL LOW (ref 13.0–17.0)
MCH: 31.6 pg (ref 26.0–34.0)
MCHC: 31.6 g/dL (ref 30.0–36.0)
MCV: 100 fL (ref 80.0–100.0)
Platelets: 319 10*3/uL (ref 150–400)
RBC: 2.97 MIL/uL — ABNORMAL LOW (ref 4.22–5.81)
RDW: 14.6 % (ref 11.5–15.5)
WBC: 15.2 10*3/uL — ABNORMAL HIGH (ref 4.0–10.5)
nRBC: 0 % (ref 0.0–0.2)

## 2020-09-07 LAB — GLUCOSE, CAPILLARY
Glucose-Capillary: 102 mg/dL — ABNORMAL HIGH (ref 70–99)
Glucose-Capillary: 113 mg/dL — ABNORMAL HIGH (ref 70–99)
Glucose-Capillary: 85 mg/dL (ref 70–99)
Glucose-Capillary: 91 mg/dL (ref 70–99)
Glucose-Capillary: 97 mg/dL (ref 70–99)
Glucose-Capillary: 97 mg/dL (ref 70–99)

## 2020-09-07 LAB — MAGNESIUM: Magnesium: 2.1 mg/dL (ref 1.7–2.4)

## 2020-09-07 IMAGING — DX DG CHEST 1V PORT
1 series · 2 of 2 positions shown · non-contrast
Comparison: [DATE].

CLINICAL DATA: Respiratory failure.

EXAM:
PORTABLE CHEST 1 VIEW

[Series 1: chest ap · 0.14mm/px · 2 of 2 slices shown]
[im 1/2]
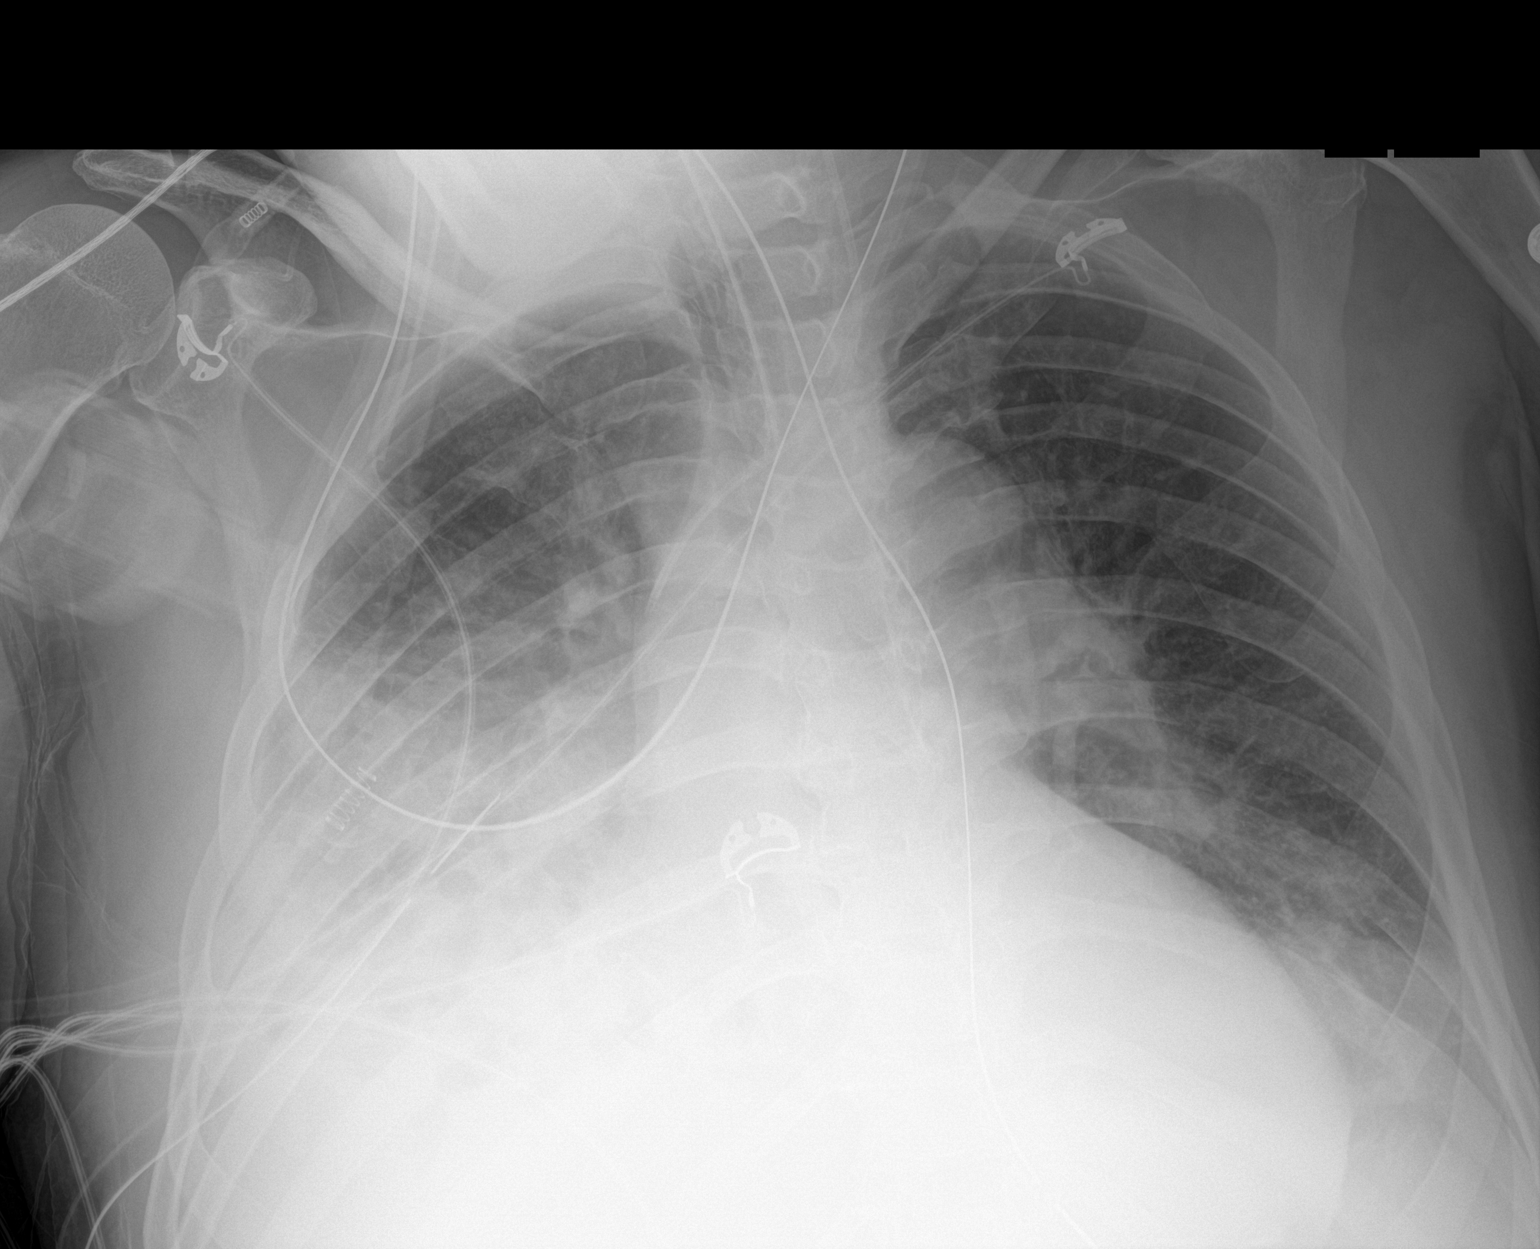
[im 2/2]
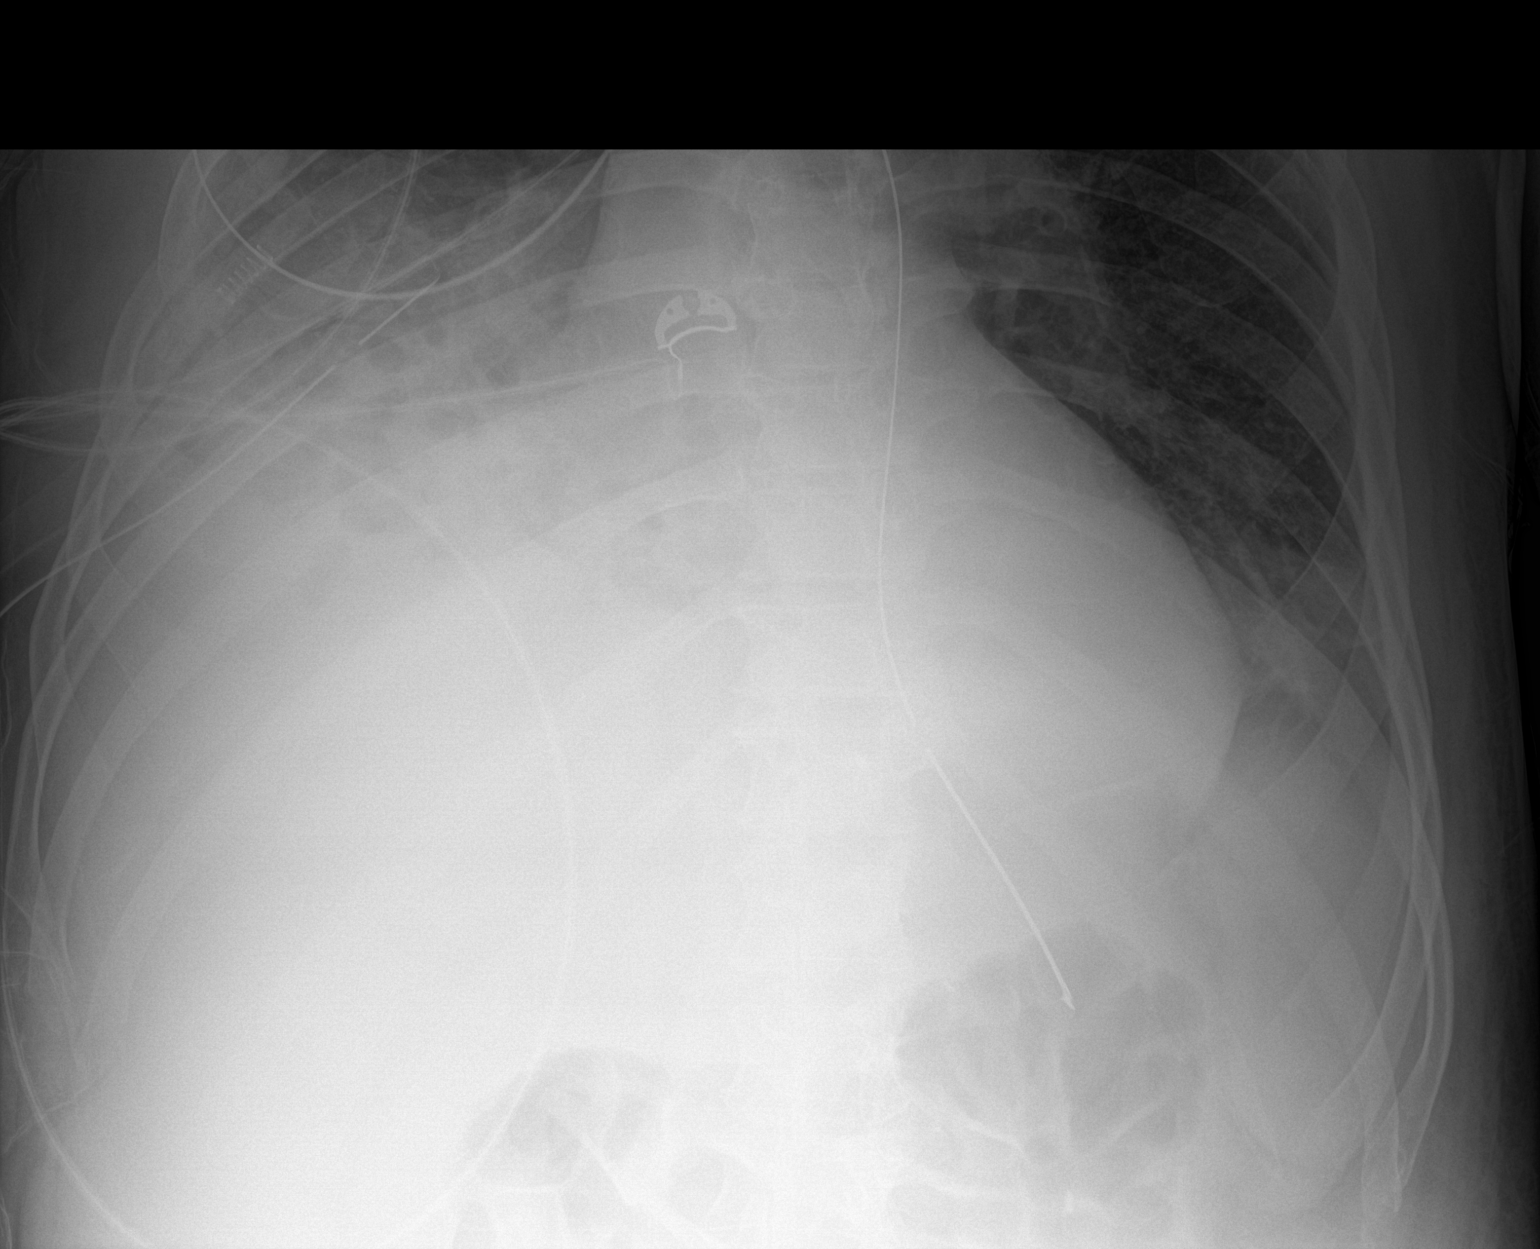

[2 of 2 positions shown; findings below may reference images not displayed]

FINDINGS: Endotracheal tube, NG tube, left IJ line, right chest tube in stable
position. Stable cardiomegaly. Bilateral pulmonary infiltrates/edema
again noted. Persistent right base atelectasis. Slight improvement
in aeration right lung on today's exam. Small bilateral pleural
effusions may be present. No pneumothorax.
IMPRESSION: 1. Lines and tubes including right chest tube in stable position. No
pneumothorax.
2. Stable cardiomegaly.
3. Bilateral pulmonary infiltrates/edema again noted. Persistent
right base atelectasis. Slight improvement in aeration in the right
lung noted on today's exam. Small bilateral pleural effusions.

## 2020-09-07 IMAGING — DX DG CHEST 1V PORT
1 series · 1 of 1 positions shown · non-contrast
Comparison: [DATE]

CLINICAL DATA: Status post bronchoscopy

EXAM:
PORTABLE CHEST 1 VIEW

[chest ap]
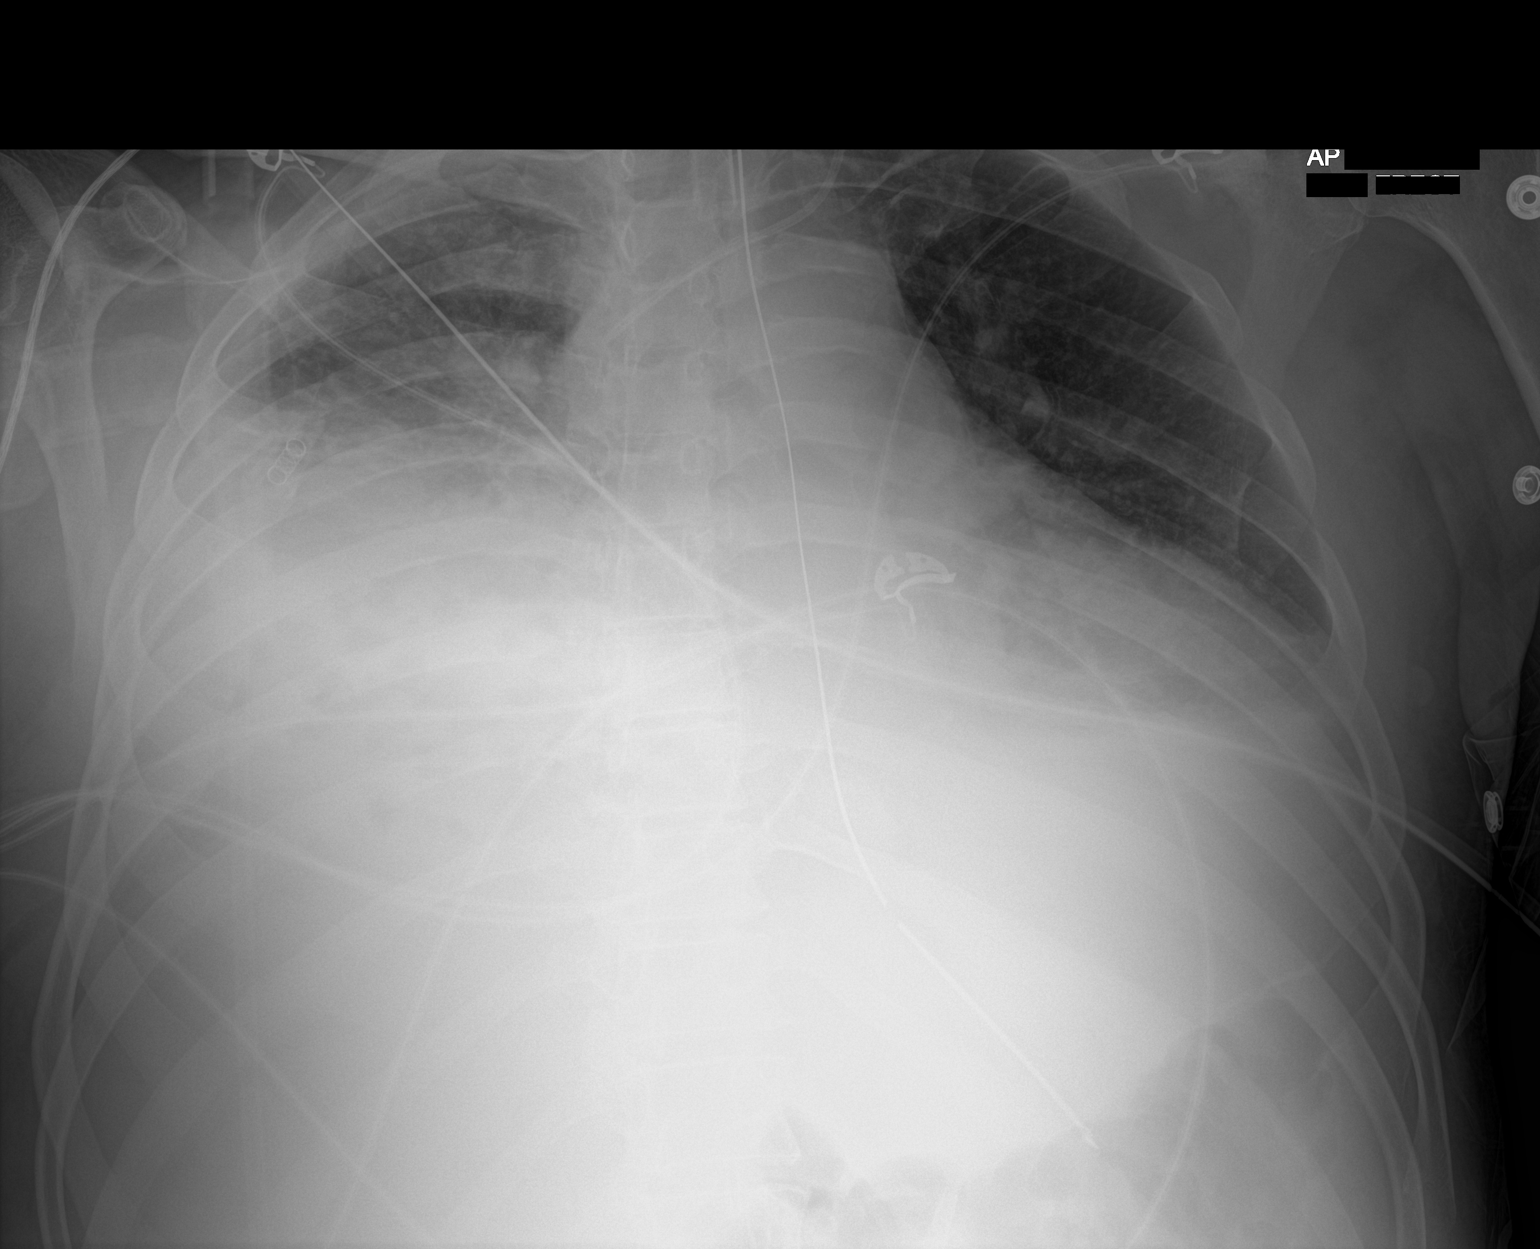

[1 of 1 positions shown; findings below may reference images not displayed]

FINDINGS: Endotracheal tube, gastric catheter and left jugular central line
are again seen and stable. Cardiac shadow is stable. The overall
inspiratory effort is poor. Persistent opacity in the right base is
noted. No pneumothorax is seen.
IMPRESSION: Persistent consolidation in the right base. No pneumothorax is noted
following bronchoscopy. The remainder of the exam is stable.

## 2020-09-07 MED ORDER — ALBUMIN HUMAN 5 % IV SOLN
25.0000 g | Freq: Once | INTRAVENOUS | Status: AC
  Administered 2020-09-07: 25 g via INTRAVENOUS
  Filled 2020-09-07: qty 500

## 2020-09-07 MED ORDER — POTASSIUM CHLORIDE 20 MEQ PO PACK
20.0000 meq | PACK | ORAL | Status: AC
Start: 2020-09-07 — End: 2020-09-07
  Administered 2020-09-07 (×2): 20 meq
  Filled 2020-09-07 (×2): qty 1

## 2020-09-07 MED ORDER — POTASSIUM CHLORIDE 10 MEQ/50ML IV SOLN
10.0000 meq | INTRAVENOUS | Status: AC
  Administered 2020-09-07 (×4): 10 meq via INTRAVENOUS
  Filled 2020-09-07 (×4): qty 50

## 2020-09-07 MED ORDER — FUROSEMIDE 10 MG/ML IJ SOLN: 80.0000 mg | Freq: Every day | INTRAMUSCULAR | Status: DC

## 2020-09-07 MED ORDER — PROPOFOL 1000 MG/100ML IV EMUL
0.0000 ug/kg/min | INTRAVENOUS | Status: DC
  Administered 2020-09-07 (×2): 40 ug/kg/min via INTRAVENOUS
  Administered 2020-09-07: 23:00:00 40.007 ug/kg/min via INTRAVENOUS
  Administered 2020-09-08: 35 ug/kg/min via INTRAVENOUS
  Administered 2020-09-08: 55 ug/kg/min via INTRAVENOUS
  Administered 2020-09-08: 07:00:00 35 ug/kg/min via INTRAVENOUS
  Administered 2020-09-08 – 2020-09-09 (×2): 55 ug/kg/min via INTRAVENOUS
  Administered 2020-09-09 (×2): 25 ug/kg/min via INTRAVENOUS
  Administered 2020-09-09: 45 ug/kg/min via INTRAVENOUS
  Administered 2020-09-09: 55 ug/kg/min via INTRAVENOUS
  Administered 2020-09-09: 45 ug/kg/min via INTRAVENOUS
  Administered 2020-09-09 (×3): 55 ug/kg/min via INTRAVENOUS
  Administered 2020-09-10: 50 ug/kg/min via INTRAVENOUS
  Administered 2020-09-10: 11:00:00 60 ug/kg/min via INTRAVENOUS
  Administered 2020-09-10: 40 ug/kg/min via INTRAVENOUS
  Administered 2020-09-10: 25 ug/kg/min via INTRAVENOUS
  Administered 2020-09-10: 45 ug/kg/min via INTRAVENOUS
  Administered 2020-09-10: 60 ug/kg/min via INTRAVENOUS
  Administered 2020-09-10: 20:00:00 50 ug/kg/min via INTRAVENOUS
  Administered 2020-09-11: 60 ug/kg/min via INTRAVENOUS
  Administered 2020-09-11: 45 ug/kg/min via INTRAVENOUS
  Administered 2020-09-11: 16:00:00 40 ug/kg/min via INTRAVENOUS
  Administered 2020-09-11 (×3): 60 ug/kg/min via INTRAVENOUS
  Administered 2020-09-11: 55 ug/kg/min via INTRAVENOUS
  Administered 2020-09-11: 10:00:00 50 ug/kg/min via INTRAVENOUS
  Administered 2020-09-12: 30 ug/kg/min via INTRAVENOUS
  Administered 2020-09-12: 60 ug/kg/min via INTRAVENOUS
  Administered 2020-09-12: 40 ug/kg/min via INTRAVENOUS
  Administered 2020-09-12 (×2): 60 ug/kg/min via INTRAVENOUS
  Administered 2020-09-12: 40 ug/kg/min via INTRAVENOUS
  Administered 2020-09-12: 12:00:00 20 ug/kg/min via INTRAVENOUS
  Administered 2020-09-13: 30 ug/kg/min via INTRAVENOUS
  Administered 2020-09-13: 35 ug/kg/min via INTRAVENOUS
  Administered 2020-09-13: 30 ug/kg/min via INTRAVENOUS
  Administered 2020-09-14 (×2): 20 ug/kg/min via INTRAVENOUS
  Administered 2020-09-14: 5 ug/kg/min via INTRAVENOUS
  Filled 2020-09-07 (×7): qty 100
  Filled 2020-09-07: qty 200
  Filled 2020-09-07 (×31): qty 100
  Filled 2020-09-07: qty 400
  Filled 2020-09-07 (×4): qty 100

## 2020-09-07 MED ORDER — PROPOFOL 1000 MG/100ML IV EMUL
INTRAVENOUS | Status: AC
  Administered 2020-09-07: 5 ug/kg/min via INTRAVENOUS
  Filled 2020-09-07: qty 100

## 2020-09-07 MED ORDER — ALBUMIN HUMAN 25 % IV SOLN: 25.0000 g | Freq: Once | INTRAVENOUS | Status: AC

## 2020-09-07 MED ORDER — NOREPINEPHRINE 4 MG/250ML-% IV SOLN
0.0000 ug/min | INTRAVENOUS | Status: DC
  Administered 2020-09-07: 2 ug/min via INTRAVENOUS
  Administered 2020-09-09: 1 ug/min via INTRAVENOUS
  Administered 2020-09-10 – 2020-09-11 (×2): 2 ug/min via INTRAVENOUS
  Filled 2020-09-07 (×3): qty 250

## 2020-09-07 MED ORDER — ALBUMIN HUMAN 25 % IV SOLN
INTRAVENOUS | Status: AC
  Administered 2020-09-07: 25 g via INTRAVENOUS
  Filled 2020-09-07: qty 100

## 2020-09-07 NOTE — Progress Notes (Signed)
K+ 3.3 replaced per eLink electrolyte protocol. Creatinie 0.66, GFR >60

## 2020-09-07 NOTE — Procedures (Signed)
Bronchoscopy Procedure Note  Joseph Ayala  [id number]  Feb 10, 1966  Date:09/07/20  Time:2:41 PM   Provider Performing:Jontavia Leatherbury A Kysa Calais   Procedure(s):  Flexible Bronchoscopy (86578)  Indication(s) MRSA pneumonia  Consent Consent obtained from spouse  Anesthesia Currently on propofol   Time Out Verified patient identification, verified procedure, site/side was marked, verified correct patient position, special equipment/implants available, medications/allergies/relevant history reviewed, required imaging and test results available.   Sterile Technique Usual hand hygiene, masks, gowns, and gloves were used   Procedure Description Bronchoscope advanced through endotracheal tube and into airway.  Airways were examined down to subsegmental level with findings noted below.   Following diagnostic evaluation, BAL(s) performed in 60 with normal saline and return of Blood-tinged mucoid fluid  Findings: Extensive secretions in the airway bilaterally   Complications/Tolerance None; patient tolerated the procedure well. Chest X-ray is not needed post procedure.   EBL Minimal   Specimen(s) Right lower lobe washings

## 2020-09-07 NOTE — Progress Notes (Addendum)
NAME:  Joseph Ayala, MRN:  [id number], DOB:  04-10-1966, LOS: 62 ADMISSION DATE:  08/27/2020, CONSULTATION DATE:  08/28/20 REFERRING MD:  Darliss Cheney, MD CHIEF COMPLAINT:  Acute hypoxemic respiratory failure  Brief History:  55 year old male on Suboxone for necrotizing myopathy admitted 2/19 with acute hypoxemic respiratory failure secondary to MRSA community-acquired pneumonia / aspiration. PCCM initially consulted for evaluation for bronchoscopy for possible mucous plugging. Transferred to ICU after worsening respiratory failure and mental status requiring intubation on 2/21. ICU course complicated by hypotension requiring pressors, chest tube placement for empyema s/p intrapleural lytics.   Past Medical History:  Hypertension, necrotizing myopathy, opioid use on Suboxone  Significant Hospital Events:  2/19 Arrived to ED for cough and chest pain x 1 week 2/20 Admitted to [facility] in early am. Intubated. Bronch 2/22 Worsening respiratory status and vasopressor requirement. Agitation on vent 2/23 Improving pressor requirement 2/24 Off pressors. Right chest tube placement. Propofol changed versed as TRG increased 2/25 About 650 out of CT since insertion. Stable vent requirements. Sedation and agitation a challenge / librium added. Fever. TPA into chest tube 2/26 FOB for mucus plugging 2/27 Briefly on neo overnight / thought sedation related  2/28 Repeat CT with little residual pleural fluid, largely consolidated lung. On vent. Oral oxy added.  3/01 Ceiling of IV gtt's reduced, weaning on PSV 10/5 - weaned for 8h. Diuresis.   3/02 Sedation changed, hypotension, levophed initiated   Consults:  TRH PCCM  Procedures:  ETT 2/21 >>  R Radial Aline 2/20 >> L IJ TLC 2/21 >> R CT 2/24 >>  Significant Diagnostic Tests:   CTA Chest 2/19 >> right sided consolidation and ground glass opacities in the right and middle lobe. Background centrilobular emphysema present. No pulmonary embolism  CT CAP  2/24 >> Interval enlargement of right pleural effusion with some loculation. Progression of right airspace disease with cavitation in right middle/lower lobe. Anasarca  CT Chest w/o 2/28 >> no large amount of residual pleural fluid present with most of the low-density abnormality in the right mid to lower chest felt to represent necrotic and consolidated RLL, RML. There is still some fluid component in the posterior pleural space superior to the tip of the indwelling chest tube  Micro Data:  Blood culture 2/19 >> negative  BAL resp cx 2/20 >> MRSA BAL fungal cx 2/20 >> no fungal elements observed on stain >> BAL AFB cx 2/20 >> no AFB on smear >>  Urine strep ag 2/21 >> neg Urine legionella ag 2/21 >> neg Pleural fluid 2/24 >> ? If sent  BAL 2/26 >> MRSA   Antimicrobials:  Ceftriaxone 2/20 >>2/21 Azithro 2/20 >> 2/21 Vanc 2/21 >> Zosyn 2/21 >> 2/23  Interim History / Subjective:  RN reports intermittent agitation, ceiling increased for sedation overnight I/O 4.4L UOP, -2.5L in last 24 hours  Glucose stable 98-114 Weaning on PSV.  Weaned 8 hours 3/1 No drainage from chest tube   Objective   Blood pressure 118/65, pulse 85, temperature 98.7 F (37.1 C), temperature source Axillary, resp. rate (!) 24, height _0  (1.803 m), weight 100.4 kg, SpO2 97 %. CVP:  [4 mmHg-6 mmHg] 4 mmHg  Vent Mode: CPAP;PSV FiO2 (%):  [40 %] 40 % Set Rate:  [24 bmp] 24 bmp Vt Set:  [600 mL] 600 mL PEEP:  [5 cmH20] 5 cmH20 Pressure Support:  [10 cmH20] 10 cmH20 Plateau Pressure:  [18 cmH20-21 cmH20] 21 cmH20   Intake/Output Summary (Last 24 hours) at 09/07/2020  1100 Last data filed at 09/07/2020 4665 Gross per 24 hour  Intake 1195.95 ml  Output 2550 ml  Net -1354.05 ml   Filed Weights   09/05/20 0500 09/06/20 0500 09/07/20 0500  Weight: 101.2 kg 101.2 kg 100.4 kg   Physical Exam: General: adult male lying in bed in NAD   HEENT: MM pink/moist, ETT Neuro: sedate, pt awakens with verbal  stimulation and shakes head side to side, does occasionally make eye contact and follow commands  CV: s1s2 RRR, no m/r/g PULM: non-labored on PSV, coarse rhonchi on right, coarse on left  GI: soft, bsx4 active  Extremities: warm/dry, 1-2+ BLE pitting edema  Skin: no rashes or lesions  PCXR 3/2 >> images personally reviewed, mild improvement in R airspace disease, ETT in good position, CT in good position   Resolved Hospital Problem list   Elevated LFTs AKI Aspiration pneumonia/pneumonitis in setting of altered mental status Septic shock (off pressors as of 2/24) Cardiogenic shock   Assessment & Plan:   Acute hypoxemic respiratory failure secondary to MRSA PNA with Effusion   Parapneumonic Effusion versus Empyema  Mucus Plugging  S/p chest tube placement 2/24, tPA 2/25, 2/26 -PRVC 8cc/kg as rest mode  -daily WUA / SBT  -continue vancomycin  -chest PT  -plan for FOB today, will discuss with family for consent  -diuresis per Cardiology  -no drainage from CT in 48 hours, likely to remove CT 3/2   Septic +/- component Cardiogenic Shock secondary to MRSA PNA Additional sedation effect and diuresis  -add levophed for MAP >65  -may be nearing evolemia / hypovolemia  -additional albumin  -vancomycin as above  New onset heart failure with ejection fraction of 35 to 40% New onset atrial flutter/SVT secondary to sepsis Demand ischemia -amiodarone per Cardiology  -full dose lovenox  -follow CVP  -levophed as above   Acute Metabolic Encephalopathy Significant alcohol use and narcotic dependence Concern for withdrawal and sepsis -hold home suboxone  -continue PT oxycodone  -trial change to propofol to assess for more expedited WUA, limit benzo's  -RASS Goal -1 to -2  -continue dilaudid gtt   Hypokalemia  -monitor, replace as indicated   Moderate Protein Calorie Malnutrition  -TF   Elevated LFT's -follow trend intermittently   Macrocytic Anemia  -monitor CBC   -transfuse per protocol -continue folate, MVI, thiamine   Best practice (evaluated daily)  Diet: TF Pain/Anxiety/Delirium protocol (if indicated): PAD goal -1 to -2 VAP protocol (if indicated): Yes DVT prophylaxis: Lovenox, treatment dose GI prophylaxis: Protonix Glucose control: CBG q4h Mobility: As tolerated Disposition: ICU  Goals of Care:  Last date of multidisciplinary goals of care discussion:  Family and staff present:  Summary of discussion:  Follow up goals of care discussion due:3/7 Code Status: Full code. Confirmed 2/28  Wife, Ginger, updated 3/1 at bedside (plan for the week to update on arrival in person).  Will update 3/2 on arrival.     Critical Care Time: 15 minutes   Noe Gens, MSN, APRN, NP-C, AGACNP-BC [facility] 09/07/2020, 11:00 AM   Please see [email or web address] for pager details.   From 7A-7P if no response, please call [phone] After hours, please call ELink [phone]

## 2020-09-07 NOTE — Progress Notes (Signed)
Plan of care explained to wife. Reviewed concept of tracheostomy, removal of chest tube, possibility of need for subsequent chest tube, plan for bronchoscopy.  Risks / benefits reviewed regarding bronchoscopy & consent obtained.     Canary Brim, MSN, APRN, NP-C, AGACNP-BC [facility] 09/07/2020, 2:36 PM   Please see [email or web address] for pager details.   From 7A-7P if no response, please call [phone] After hours, please call ELink [phone]

## 2020-09-07 NOTE — Progress Notes (Addendum)
Progress Note  Patient Name: Joseph Ayala Date of Encounter: 09/07/2020  [facility] Cardiologist: Donato Heinz, MD   Subjective   Remains intubated. Weaning off sedation with some restlessness this morning. Able to follow commands intermittently. PCCM at bedside - anticipating possible bronch today.   Inpatient Medications    Scheduled Meds:  acetylcysteine  2 mL Nebulization BID   chlorhexidine gluconate (MEDLINE KIT)  15 mL Mouth Rinse BID   Chlorhexidine Gluconate Cloth  6 each Topical Daily   docusate  100 mg Per Tube BID   enoxaparin (LOVENOX) injection  100 mg Subcutaneous Q12H   feeding supplement (PIVOT 1.5 CAL)  1,000 mL Per Tube Q24H   feeding supplement (PROSource TF)  45 mL Per Tube Daily   folic acid  1 mg Per Tube Daily   furosemide  80 mg Intravenous BID   mouth rinse  15 mL Mouth Rinse 10 times per day   multivitamin  15 mL Per Tube Daily   oxyCODONE  5 mg Per Tube Q6H   pantoprazole sodium  40 mg Per Tube Daily   polyethylene glycol  17 g Per Tube Daily   potassium chloride  20 mEq Per Tube Q4H   sodium chloride flush  10-40 mL Intracatheter Q12H   thiamine  100 mg Per Tube Daily   Continuous Infusions:  albumin human     sodium chloride Stopped (09/05/20 2352)   albumin human     amiodarone 30 mg/hr (09/07/20 0037)   HYDROmorphone 5 mg/hr (09/07/20 0621)   midazolam 5 mg/hr (09/07/20 0621)   potassium chloride 10 mEq (09/07/20 0737)   vancomycin Stopped (09/07/20 0240)   PRN Meds: acetaminophen (TYLENOL) oral liquid 160 mg/5 mL **OR** acetaminophen, albuterol, HYDROmorphone, ibuprofen, midazolam, sodium chloride flush   Vital Signs    Vitals:   09/07/20 0400 09/07/20 0500 09/07/20 0600 09/07/20 0700  BP: (!) 123/49 (!) 105/59 114/60 118/65  Pulse: 87 92 86 85  Resp: (!) 24 (!) 25 (!) 24 (!) 24  Temp: 98.7 F (37.1 C)     TempSrc: Axillary     SpO2: 97% 96% 96% 97%  Weight:  100.4 kg    Height:        Intake/Output Summary  (Last 24 hours) at 09/07/2020 0754 Last data filed at 09/07/2020 7062 Gross per 24 hour  Intake 1855.03 ml  Output 4400 ml  Net -2544.97 ml   Last 3 Weights 09/07/2020 09/06/2020 09/05/2020  Weight (lbs) 221 lb 5.5 oz 223 lb 1.7 oz 223 lb 1.7 oz  Weight (kg) 100.4 kg 101.2 kg 101.2 kg      Telemetry    Sinus rhythm/sinus tachycardia with rates 80s-100s - Personally Reviewed  ECG    No new tracings - Personally Reviewed  Physical Exam   GEN: Critically ill. Remains intubated. Mild agitation    Neck: No JVD Cardiac: RRR, no murmurs, rubs, or gallops.  Respiratory: Diffuse rhonchi R>L GI: Soft, nontender, non-distended  MS: 2+ LE edema; No deformity. Neuro:  moving all limbs spontaneously Psych: unable to assess  Labs    High Sensitivity Troponin:   Recent Labs  Lab 08/27/20 1953 08/28/20 0704 08/30/20 1646 08/30/20 2326  TROPONINIHS 9 17 391* 377*      Chemistry Recent Labs  Lab 09/04/20 0330 09/05/20 0434 09/06/20 0505 09/07/20 0430  NA 146* 145 144 145  K 3.3* 3.4* 3.2* 3.3*  CL 111 109 110 109  CO2 _0 GLUCOSE  114* 279* 114* 114*  BUN 27* 29* 24* 24*  CREATININE 0.59* 0.62 0.55* 0.66  CALCIUM 8.1* 8.2* 8.2* 8.1*  PROT 5.1* 5.0* 5.1*  --   ALBUMIN 1.8* 1.9* 1.9*  --   AST 77* 74* 69*  --   ALT 111* 99* 102*  --   ALKPHOS 217* 172* 179*  --   BILITOT 0.8 1.0 0.9  --   GFRNONAA >60 >60 >60 >60  ANIONGAP _0 Hematology Recent Labs  Lab 09/05/20 0434 09/06/20 0505 09/07/20 0430  WBC 17.3* 18.6* 15.2*  RBC 3.25* 3.20* 2.97*  HGB 10.1* 10.0* 9.4*  HCT 32.7* 32.1* 29.7*  MCV 100.6* 100.3* 100.0  MCH 31.1 31.3 31.6  MCHC 30.9 31.2 31.6  RDW 14.7 14.6 14.6  PLT 277 290 319    BNPNo results for input(s): BNP, PROBNP in the last 168 hours.   DDimer No results for input(s): DDIMER in the last 168 hours.   Radiology    CT CHEST WO CONTRAST  Result Date: 09/05/2020 CLINICAL DATA:  Respiratory failure, necrotizing pneumonia of  right lung with associated empyema and status post right-sided chest tube placement with administration of intrapleural lytic therapy. EXAM: CT CHEST WITHOUT CONTRAST TECHNIQUE: Multidetector CT imaging of the chest was performed following the standard protocol without IV contrast. COMPARISON:  Prior CT studies on 08/31/2020 and 08/27/2020. FINDINGS: Cardiovascular: Stable heart size and coronary artery calcifications. Stable mild dilatation of the ascending thoracic aorta which measures up to approximately 4.2 cm in greatest diameter. Left jugular central line present extending into the upper SVC. Mediastinum/Nodes: Scattered small mediastinal lymph nodes appears stable and likely are reactive. Lungs/Pleura: Left lateral chest tube enters the lower pleural space and ascends in the posterior pleural space. It is somewhat difficult to separate pleural abnormality from adjacent parenchymal abnormality without the presence of IV contrast. However, there does not appear to be a large amount of residual pleural fluid present and most of the low-density abnormality in the right mid to lower chest is felt to represent necrotic and consolidated right lower lobe and right middle lobe. There is still some fluid component likely present in the pleural space posteriorly, mainly superior to the tip of the indwelling chest tube. No pneumothorax. Atelectasis present involving the posterior left lower lobe. Upper Abdomen: No acute abnormality. Musculoskeletal: No chest wall mass or suspicious bone lesions identified. IMPRESSION: 1. No large amount of residual pleural fluid present with most of the low-density abnormality in the right mid to lower chest felt to represent necrotic and consolidated right lower lobe and right middle lobe. There is still some fluid component likely present in the pleural space posteriorly, mainly superior to the tip of the indwelling chest tube. It is difficult to differentiate pleural from parenchymal  abnormality due to similar densities by CT and any follow-up imaging of the chest by CT would benefit from administration of IV contrast, if possible. 2. Stable mild dilatation of the ascending thoracic aorta which measures up to approximately 4.2 cm in greatest diameter. Electronically Signed   By: Aletta Edouard M.D.   On: 09/05/2020 15:03   DG CHEST PORT 1 VIEW  Result Date: 09/07/2020 CLINICAL DATA:  Respiratory failure. EXAM: PORTABLE CHEST 1 VIEW COMPARISON:  09/06/2020. FINDINGS: Endotracheal tube, NG tube, left IJ line, right chest tube in stable position. Stable cardiomegaly. Bilateral pulmonary infiltrates/edema again noted. Persistent right base atelectasis. Slight improvement in aeration right lung on today's exam. Small bilateral  pleural effusions may be present. No pneumothorax. IMPRESSION: 1. Lines and tubes including right chest tube in stable position. No pneumothorax. 2. Stable cardiomegaly. 3. Bilateral pulmonary infiltrates/edema again noted. Persistent right base atelectasis. Slight improvement in aeration in the right lung noted on today's exam. Small bilateral pleural effusions. Electronically Signed   By: Marcello Moores  Register   On: 09/07/2020 05:54   DG Chest Port 1 View  Result Date: 09/06/2020 CLINICAL DATA:  Follow-up pneumonia EXAM: PORTABLE CHEST 1 VIEW COMPARISON:  09/05/2020 FINDINGS: Endotracheal tube, gastric catheter and left jugular central line are again seen and stable. Cardiac shadow is stable. Persistent consolidation in the right base is noted. Chest tube is noted on the right without evidence of pneumothorax. Left lung remains clear. No bony abnormality is noted. IMPRESSION: Persistent right basilar consolidation.  No pneumothorax is noted. Tubes and lines as described. Electronically Signed   By: Inez Catalina M.D.   On: 09/06/2020 08:24    Cardiac Studies   Echocardiogram 08/30/20: 1. Left ventricular ejection fraction, by estimation, is 35 to 40%. The  left  ventricle has moderately decreased function. The left ventricle  demonstrates global hypokinesis. The left ventricular internal cavity size  was moderately dilated. Left  ventricular diastolic parameters are indeterminate.   2. Right ventricular systolic function is mildly reduced. The right  ventricular size is moderately enlarged. There is mildly elevated  pulmonary artery systolic pressure.   3. Left atrial size was mildly dilated.   4. Right atrial size was moderately dilated.   5. The mitral valve is normal in structure. Mild mitral valve  regurgitation. No evidence of mitral stenosis.   6. The aortic valve has an indeterminant number of cusps. There is mild  calcification of the aortic valve. Aortic valve regurgitation is not  visualized. Mild aortic valve sclerosis is present, with no evidence of  aortic valve stenosis.   7. Aortic dilatation noted. There is mild to moderate dilatation of the  ascending aorta, measuring 44 mm.   8. The inferior vena cava is dilated in size with <50% respiratory  variability, suggesting right atrial pressure of 15 mmHg.       Patient Profile     55 y.o. male with a PMH of HTN, nectrotizing myopathy, opioid use disorder on suboxone, ETOH abuse, low testosterone who is being followed by cardiology for the evaluation of new LV dysfunction/cardiomyopathy and atrial fibrillation.   Assessment & Plan    1. MRSA PNA/parapneumonia effusion s/p chest tube: patient presented with sepsis 2/2 MRSA PNA requiring pressors. He underwent chest tube placement for right pleural effusion/possible empyema with progressive PNA felt to be compatible with necrotizing PNA. He remains intubated and on IV antibiotics. BCx's have remained negative so TEE deferred. He underwent bronchoscopy 2/27 for management of mucus plug. He continues to have intermittent fevers. CXR this AM with slight improvement in aeration of right lung with ongoing bilateral infiltrates/edema and small  pleural effusions. PCCM considering removal of chest tube today.  - Continue management per primary team   2. Acute combined CHF/dilated cardiomyopathy: Echo this admission showed EF 35-40%, global hypokinesis, moderate LV dilation, indeterminate LV diastolic function, mildly reduced RV systolic function with moderate RV dilation, mild LAE, moderate RAE, mild MR, and 75m dilation of the ascending aorta. Etiology of cardiomyopathy remains unclear - stress induced vs ETOH induced, though cannot exclude ischemic cardiomyopathy. GDMT has been limited by intermittent hypotension; he remains on pressors. Lasix bumped up to 8103mBID yesterday with improvement  in I&Os, now net -2.5L in the past 24 hours and +3.9L this admission. If accurate, weight was 210lbs on admission, peaked at 241lbs 09/01/20, and is down to 221lbs today. CVP is 4 today. BP intermittently low but overall stable - lasix dose per MD - Hopeful to add GDMT as BP improves - thankfully Cr has remained stable  - Not a candidate for definitive ischemic evaluation at this time. Would be reasonable to repeat an echocardiogram in 3 months following initiation of GDMT and abstinence from ETOH and if EF not improve, consider LHC at that time once recovered from present illness. - Continue to monitor strict I&Os and daily weights - Continue to monitor electrolytes closely and replete as needed to maintain K >4, Mg <2 - he is receiving IV K 10 mEq x4 runs this morning as well as 20 mEp po x2.    3. Atrial fibrillation: new diagnosis this admission. Rates elevated to 100s yesterday afternoon in the setting of a fever, generally in the 80s-90s. He is on IV amiodarone given soft blood pressures requiring pressors. Likely driven by #1, however cannot exclude prior occurrences. He has been maintained on therapeutic lovenox as he remains intubated and sedated, and continues to require intermittent procedures. No recurrence of atrial fibrillation over the past 48  hours.  - Continue IV amiodarone for rate/rhythm control - Continue therapeutic lovenox for stroke ppx - hopeful to transition to DOAC once tolerating po and no further procedures necessary.    4. HTN: BP improving, now off pressors. Still intermittently soft at times. Does not appear to have been on medications prior to admission - Anticipate management in the setting of #2   5. Aortic aneurysm: noted to have a 31m dilation of the ascending aorta on echo this admission - Continue routine outpatient monitoring       For questions or updates, please contact CErnstvillePlease consult [email or web address] for contact info under        Signed, KAbigail Butts PA-C  09/07/2020, 7:54 AM

## 2020-09-07 NOTE — Progress Notes (Signed)
eLink Physician-Brief Progress Note Patient Name: Joseph Ayala DOB: [birth year] MRN: [id number]   Date of Service  09/07/2020  HPI/Events of Note  Patient having frequent loose stools with skin breakdown. Nursing request for flexiseal  eICU Interventions  Plan: 1. Place Flexiseal.      Intervention Category Major Interventions: Other:  Lenell Antu 09/07/2020, 8:25 PM

## 2020-09-07 NOTE — Progress Notes (Signed)
Patient with agitation during wake up assessment-medications resumed, boluses administered- ongoing agitation w/ no relief. Canary Brim NP notified and orders received for propofol. Versed cut off at initiation of propofol and titrated for patient comfort. CCM notified of soft BP- orders received for albumin and Levophed.

## 2020-09-07 NOTE — Progress Notes (Signed)
RT help MD with bedside bronch. Pt had no complications at this time.

## 2020-09-07 NOTE — Progress Notes (Addendum)
Chest tube was removed uneventfully   Tolerated bronchoscopy well Bronc was significant for copious bloody secretions

## 2020-09-08 ENCOUNTER — Inpatient Hospital Stay: Payer: Self-pay

## 2020-09-08 ENCOUNTER — Inpatient Hospital Stay (HOSPITAL_COMMUNITY): Payer: Medicare HMO

## 2020-09-08 DIAGNOSIS — I480 Paroxysmal atrial fibrillation: Secondary | ICD-10-CM | POA: Diagnosis not present

## 2020-09-08 DIAGNOSIS — J189 Pneumonia, unspecified organism: Secondary | ICD-10-CM | POA: Diagnosis not present

## 2020-09-08 DIAGNOSIS — I5041 Acute combined systolic (congestive) and diastolic (congestive) heart failure: Secondary | ICD-10-CM | POA: Diagnosis not present

## 2020-09-08 DIAGNOSIS — J9601 Acute respiratory failure with hypoxia: Secondary | ICD-10-CM | POA: Diagnosis not present

## 2020-09-08 DIAGNOSIS — L8990 Pressure ulcer of unspecified site, unspecified stage: Secondary | ICD-10-CM | POA: Diagnosis present

## 2020-09-08 DIAGNOSIS — F1190 Opioid use, unspecified, uncomplicated: Secondary | ICD-10-CM | POA: Diagnosis not present

## 2020-09-08 DIAGNOSIS — J90 Pleural effusion, not elsewhere classified: Secondary | ICD-10-CM | POA: Diagnosis not present

## 2020-09-08 LAB — CBC
HCT: 29.5 % — ABNORMAL LOW (ref 39.0–52.0)
Hemoglobin: 9.2 g/dL — ABNORMAL LOW (ref 13.0–17.0)
MCH: 31.5 pg (ref 26.0–34.0)
MCHC: 31.2 g/dL (ref 30.0–36.0)
MCV: 101 fL — ABNORMAL HIGH (ref 80.0–100.0)
Platelets: 325 10*3/uL (ref 150–400)
RBC: 2.92 MIL/uL — ABNORMAL LOW (ref 4.22–5.81)
RDW: 14.7 % (ref 11.5–15.5)
WBC: 13.2 10*3/uL — ABNORMAL HIGH (ref 4.0–10.5)
nRBC: 0 % (ref 0.0–0.2)

## 2020-09-08 LAB — GLUCOSE, CAPILLARY
Glucose-Capillary: 106 mg/dL — ABNORMAL HIGH (ref 70–99)
Glucose-Capillary: 106 mg/dL — ABNORMAL HIGH (ref 70–99)
Glucose-Capillary: 76 mg/dL (ref 70–99)
Glucose-Capillary: 91 mg/dL (ref 70–99)
Glucose-Capillary: 91 mg/dL (ref 70–99)
Glucose-Capillary: 93 mg/dL (ref 70–99)

## 2020-09-08 LAB — BASIC METABOLIC PANEL
Anion gap: 7 (ref 5–15)
BUN: 23 mg/dL — ABNORMAL HIGH (ref 6–20)
CO2: 27 mmol/L (ref 22–32)
Calcium: 8.1 mg/dL — ABNORMAL LOW (ref 8.9–10.3)
Chloride: 109 mmol/L (ref 98–111)
Creatinine, Ser: 0.65 mg/dL (ref 0.61–1.24)
GFR, Estimated: >60 mL/min (ref >60)
Glucose, Bld: 118 mg/dL — ABNORMAL HIGH (ref 70–99)
Potassium: 3.5 mmol/L (ref 3.5–5.1)
Sodium: 143 mmol/L (ref 135–145)

## 2020-09-08 LAB — MAGNESIUM: Magnesium: 2.1 mg/dL (ref 1.7–2.4)

## 2020-09-08 LAB — TRIGLYCERIDES: Triglycerides: 177 mg/dL — ABNORMAL HIGH (ref <150)

## 2020-09-08 IMAGING — DX DG CHEST 1V PORT
1 series · 2 of 2 positions shown · non-contrast
Comparison: [DATE]

CLINICAL DATA: Respiratory failure

EXAM:
PORTABLE CHEST 1 VIEW

[Series 1: chest ap · 0.14mm/px · 2 of 2 slices shown]
[im 1/2]
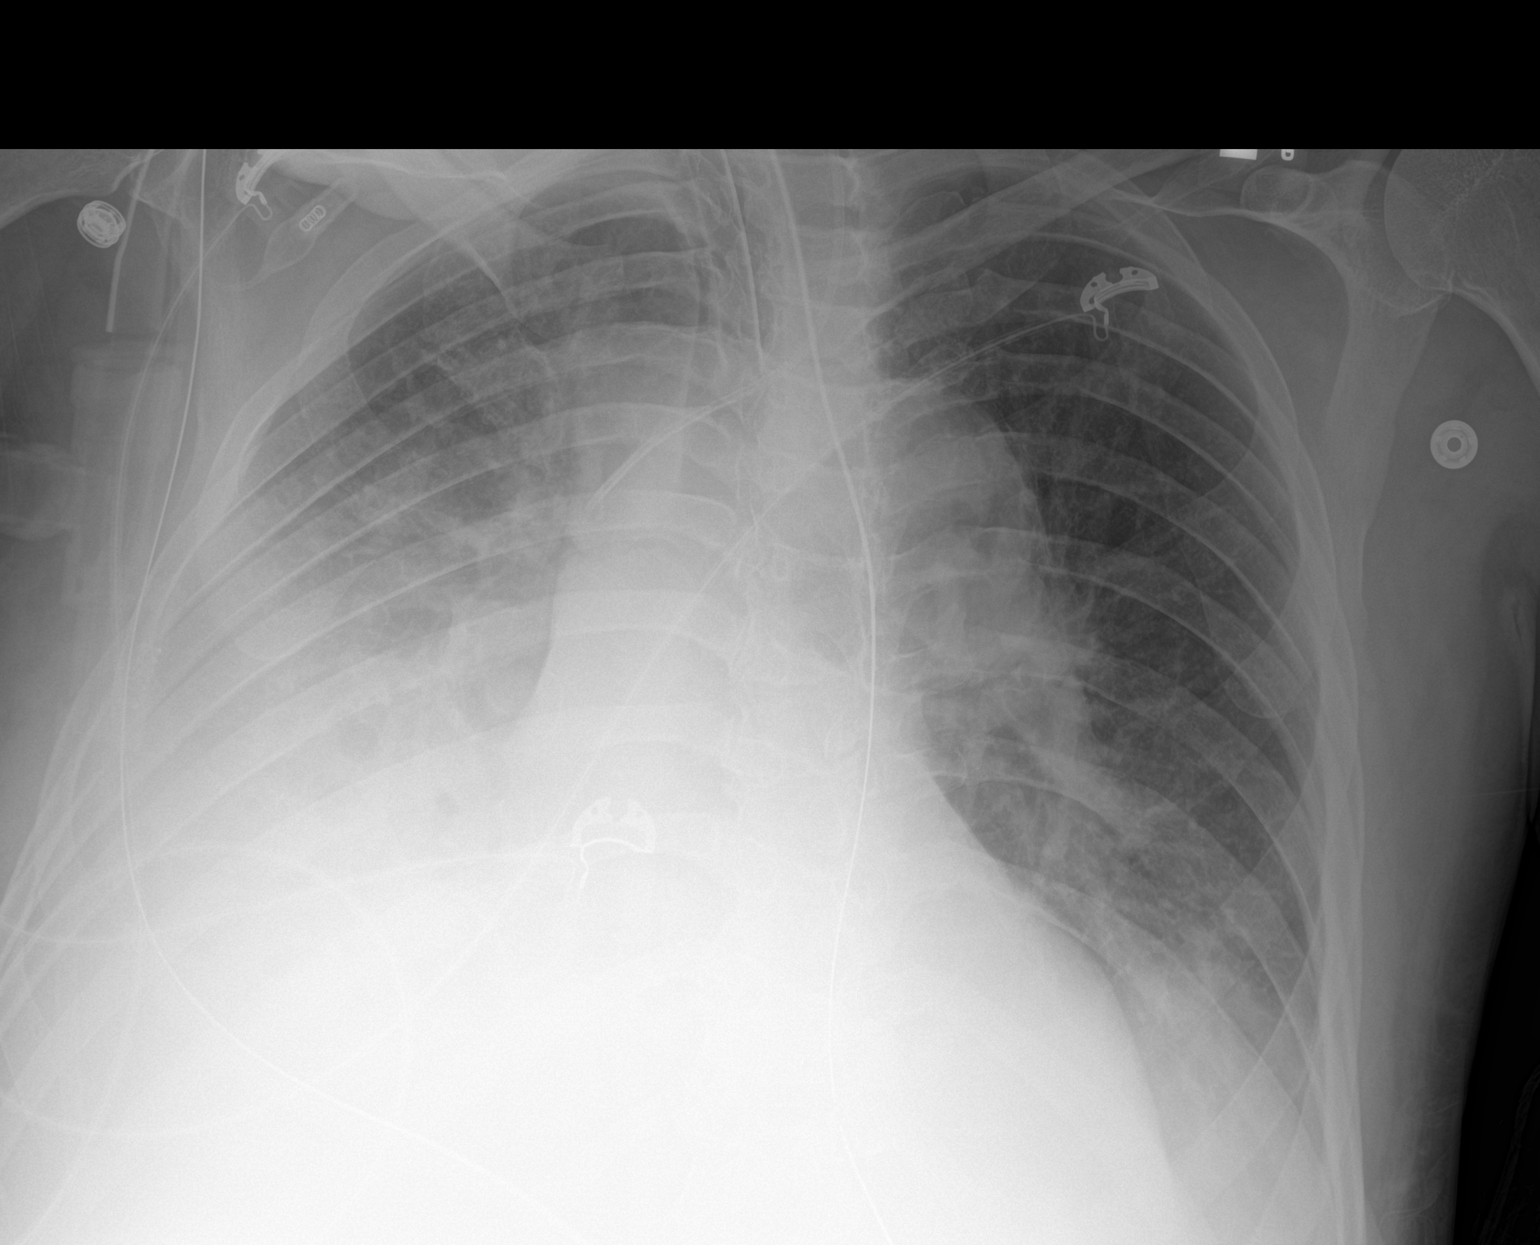
[im 2/2]
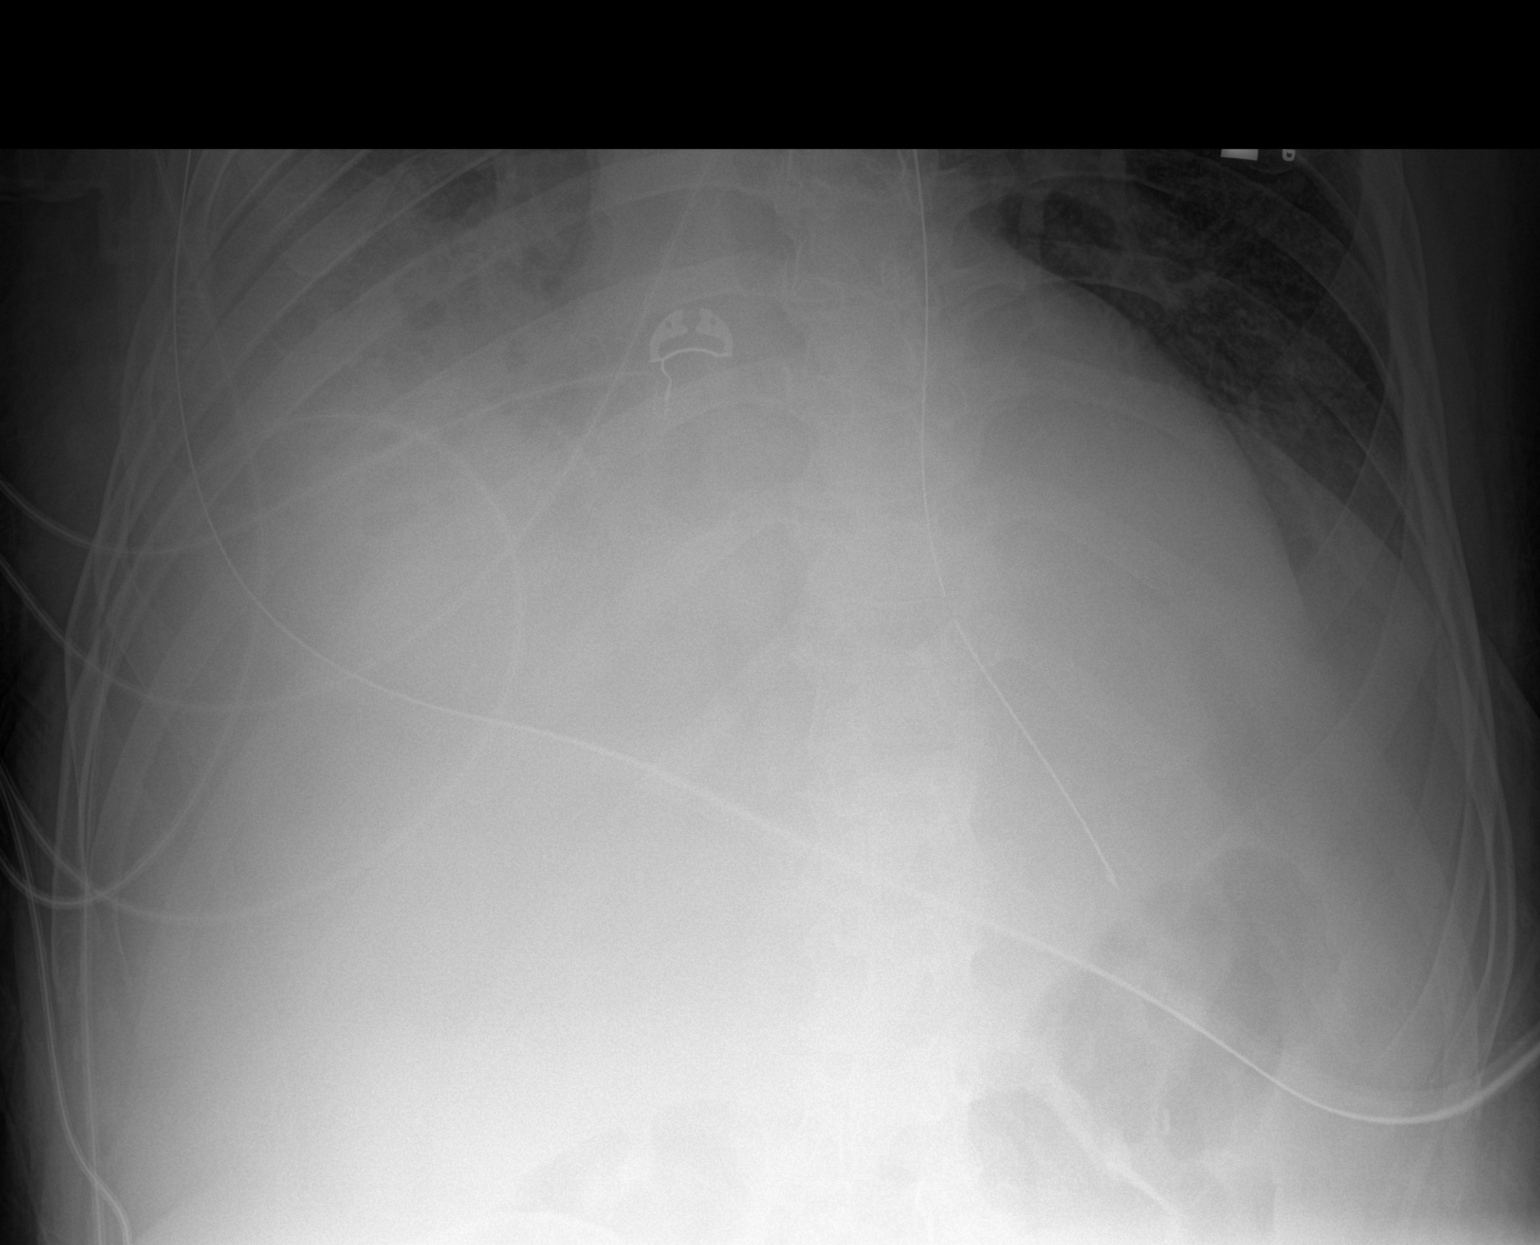

[2 of 2 positions shown; findings below may reference images not displayed]

FINDINGS: Endotracheal tube, enteric tube, and left IJ line again identified.
Patient is significantly rotated. Persistent right basilar
opacification with some improvement in aeration. No pneumothorax.
Stable cardiomediastinal contours.
IMPRESSION: Persistent right basilar opacification with some improvement in
aeration since [DATE]. [DATE] reflect combination of pleural
effusion and atelectasis/consolidation.

## 2020-09-08 IMAGING — DX DG ABDOMEN 1V
1 series · 1 of 1 positions shown · non-contrast
Comparison: [DATE]

CLINICAL DATA: Enteric catheter placement

EXAM:
ABDOMEN - 1 VIEW

[abdomen kub]
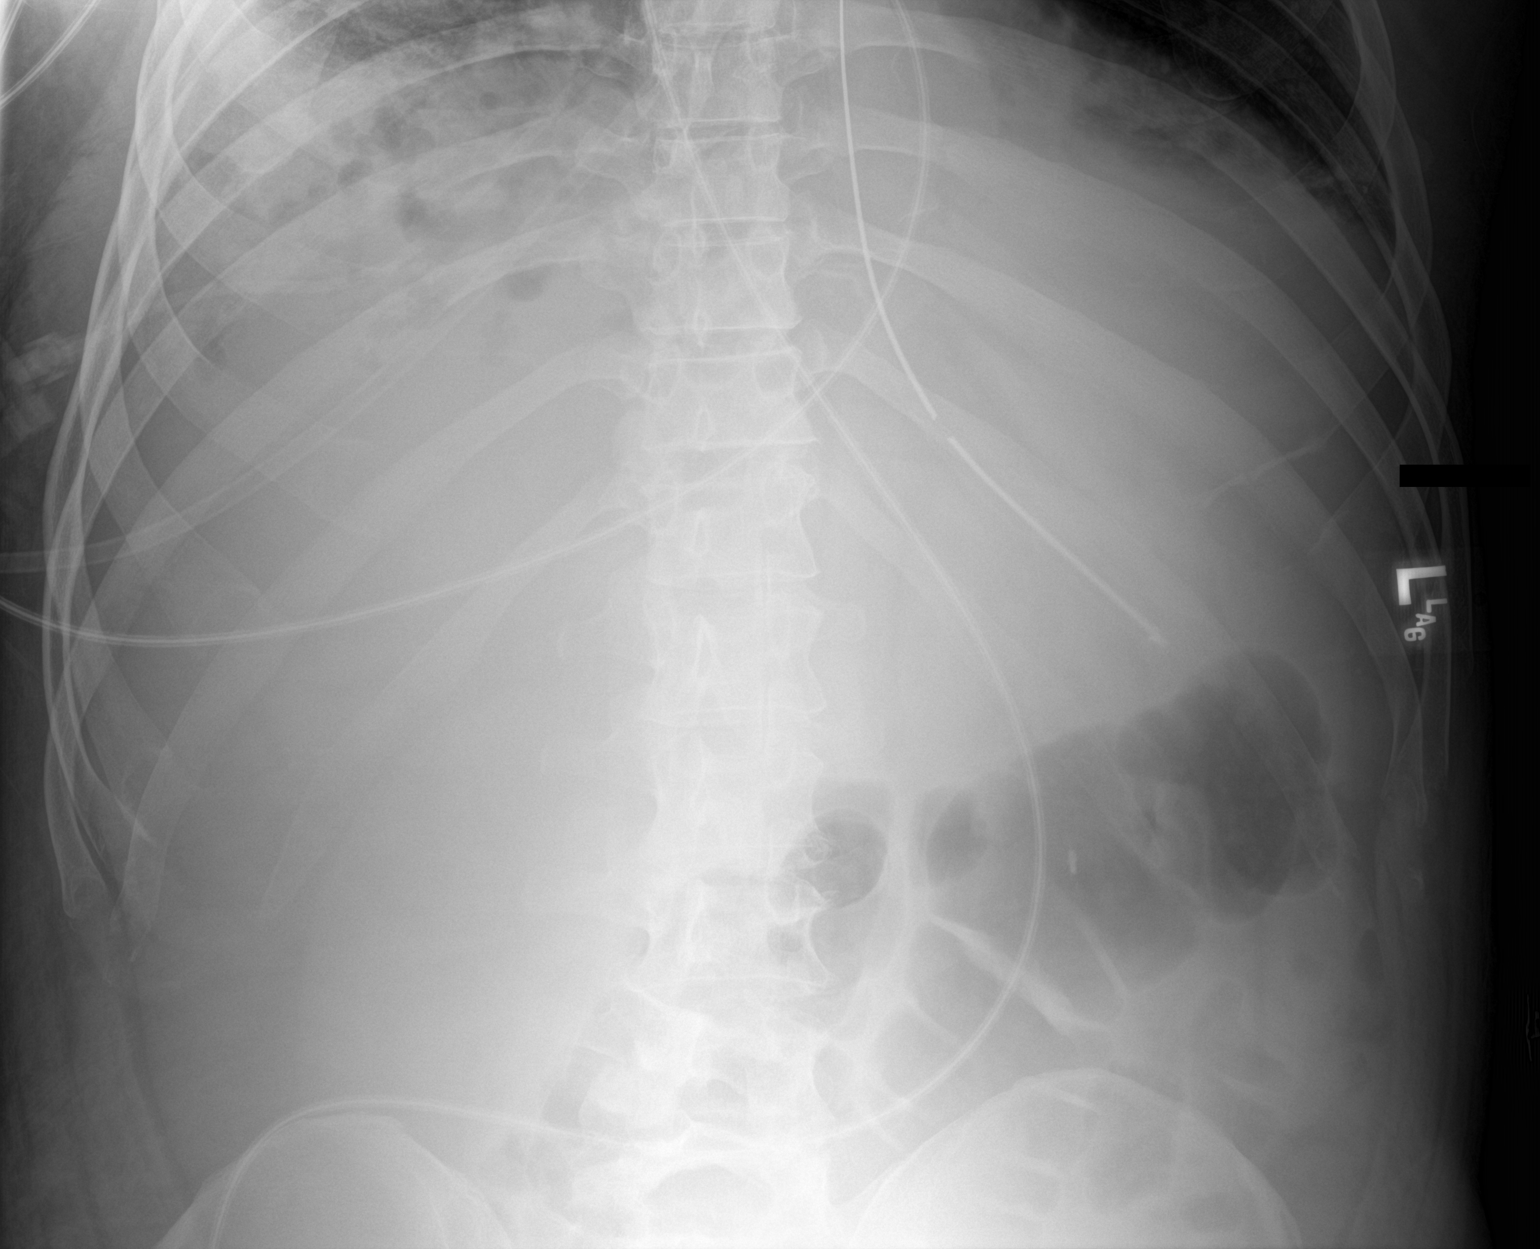

[1 of 1 positions shown; findings below may reference images not displayed]

FINDINGS: Frontal view of the lower chest and upper abdomen demonstrates tip
and side port of an enteric catheter projecting over the gastric
body. Bibasilar consolidation, right greater than left unchanged.
Bowel gas pattern is unremarkable.
IMPRESSION: 1. Enteric catheter projecting over the gastric body.
2. Bibasilar consolidation, right greater than left.

## 2020-09-08 MED ORDER — BETHANECHOL CHLORIDE 10 MG PO TABS
10.0000 mg | ORAL_TABLET | Freq: Three times a day (TID) | ORAL | Status: DC
  Administered 2020-09-08 – 2020-09-11 (×11): 10 mg
  Filled 2020-09-08 (×12): qty 1

## 2020-09-08 MED ORDER — AMIODARONE PEDIATRIC ORAL SUSPENSION 5 MG/ML: 200.0000 mg | Freq: Every day | ORAL | Status: DC

## 2020-09-08 MED ORDER — AMIODARONE HCL 200 MG PO TABS
200.0000 mg | ORAL_TABLET | Freq: Every day | ORAL | Status: DC
  Administered 2020-09-09 – 2020-09-19 (×11): 200 mg
  Filled 2020-09-08 (×11): qty 1

## 2020-09-08 NOTE — TOC Progression Note (Signed)
Transition of Care [facility]) - Progression Note    Patient Details  Name: Joseph Ayala MRN: [id number] Date of Birth: 12-Feb-1966  Transition of Care [facility]) CM/SW Contact  Golda Acre, RN Phone Number: 09/08/2020, 7:30 AM  Clinical Narrative:    Acute hypoxemic respiratory failure due to MRSA pneumonia Parapneumonic effusion Septic shock/cardiogenic shock New onset heart failure with ejection fraction 35 to 40%  -Remains on amiodarone and anticoagulation    Metabolic encephalopathy  Chronic alcohol and narcotic dependence    Goal is still to continue weaning  Discussions about need for tracheostomy was brought up    Continue nutritional support    Risk of decompensation remains high  Left chest tube removed on 030222, bronchoscopy done on 030222 Plan is to follow progression-weaning from the vent and then toc needs for home. Expected Discharge Plan: Home/Self Care Barriers to Discharge: Continued Medical Work up  Expected Discharge Plan and Services Expected Discharge Plan: Home/Self Care   Discharge Planning Services: CM Consult   Living arrangements for the past 2 months: Single Family Home                                       Social Determinants of Health (SDOH) Interventions    Readmission Risk Interventions No flowsheet data found.

## 2020-09-08 NOTE — Progress Notes (Signed)
Chaplain engaged in an initial visit with Dayvion's wife, Ginger.  Chaplain learned about the decisions that Ginger is standing in the midst of concerning Bradly's health.  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  She described how hard it is to go from being the nurse to the family member at their loved one's bedside.  Ginger also detailed the many things she has to do at home.  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  Ginger also expressed having a great support system to not only help at home but also help her in making the right decision.  Ginger voiced wanting to take a couple of days before deciding what is next.  Chaplain offered presence and listening.  Ginger voiced that her and Beaux have been together since 1986 and how much they have grown up together.  Chaplain affirmed her love and honor of [place] and that she will make the right decision for him.    Chaplain will follow-up.    09/08/20 1300  Clinical Encounter Type  Visited With Patient and family together  Visit Type Initial;Spiritual support

## 2020-09-08 NOTE — Progress Notes (Signed)
NAME:  Joseph Ayala, MRN:  [id number], DOB:  07-11-1965, LOS: 67 ADMISSION DATE:  08/27/2020, CONSULTATION DATE:  08/28/20 REFERRING MD:  Darliss Cheney, MD CHIEF COMPLAINT:  Acute hypoxemic respiratory failure  Brief History:  55 year old male on Suboxone for necrotizing myopathy admitted 2/19 with acute hypoxemic respiratory failure secondary to MRSA community-acquired pneumonia / aspiration. PCCM initially consulted for evaluation for bronchoscopy for possible mucous plugging. Transferred to ICU after worsening respiratory failure and mental status requiring intubation on 2/21. ICU course complicated by hypotension requiring pressors, chest tube placement for empyema s/p intrapleural lytics.   Past Medical History:  Hypertension, necrotizing myopathy, opioid use on Suboxone  Significant Hospital Events:  2/19 Arrived to ED for cough and chest pain x 1 week 2/20 Admitted to [facility] in early am. Intubated. Bronch 2/22 Worsening respiratory status and vasopressor requirement. Agitation on vent 2/23 Improving pressor requirement 2/24 Off pressors. Right chest tube placement. Propofol changed versed as TRG increased 2/25 About 650 out of CT since insertion. Stable vent requirements. Sedation and agitation a challenge / librium added. Fever. TPA into chest tube 2/26 FOB for mucus plugging 2/27 Briefly on neo overnight / thought sedation related  2/28 Repeat CT with little residual pleural fluid, largely consolidated lung. On vent. Oral oxy added.  3/01 Ceiling of IV gtt's reduced, weaning on PSV 10/5 - weaned for 8h. Diuresis.   3/02 Sedation changed, hypotension, levophed initiated. FOB with BAL, chest tube removed.    Consults:  TRH PCCM  Procedures:  ETT 2/21 >>  R Radial Aline 2/20 >> L IJ TLC 2/21 >> R CT 2/24 >>  Significant Diagnostic Tests:   CTA Chest 2/19 >> right sided consolidation and ground glass opacities in the right and middle lobe. Background centrilobular emphysema  present. No pulmonary embolism  CT CAP 2/24 >> Interval enlargement of right pleural effusion with some loculation. Progression of right airspace disease with cavitation in right middle/lower lobe. Anasarca  CT Chest w/o 2/28 >> no large amount of residual pleural fluid present with most of the low-density abnormality in the right mid to lower chest felt to represent necrotic and consolidated RLL, RML. There is still some fluid component in the posterior pleural space superior to the tip of the indwelling chest tube  Micro Data:  Blood culture 2/19 >> negative  BAL resp cx 2/20 >> MRSA BAL fungal cx 2/20 >> no fungal elements observed on stain >> BAL AFB cx 2/20 >> no AFB on smear >>  Urine strep ag 2/21 >> neg Urine legionella ag 2/21 >> neg Pleural fluid 2/24 >> ? If sent  BAL 2/26 >> MRSA  BAL 3/2 >> GPC's in clusters >>   Antimicrobials:  Ceftriaxone 2/20 >> 2/21 Azithro 2/20 >> 2/21 Vanc 2/21 >> Zosyn 2/21 >> 2/23  Interim History / Subjective:  Tmax 100.7 / WBC 13.2  Glucose range 85-106 I/O 1.2L UOP, 200 ml stool, +742 ml in last 24 hours  Levophed 89mg, propofol 35 mcg, dilaudid 558mCVP 5   Objective   Blood pressure (!) 115/43, pulse 76, temperature (!) 100.7 F (38.2 C), temperature source Axillary, resp. rate (!) 24, height _0  (1.803 m), weight 101.8 kg, SpO2 93 %.    Vent Mode: PRVC FiO2 (%):  [40 %] 40 % Set Rate:  [24 bmp] 24 bmp Vt Set:  [600 mL] 600 mL PEEP:  [5 cmH20] 5 cmH20 Plateau Pressure:  [16 cmH20-21 cmH20] 16 cmH20   Intake/Output Summary (Last 24  hours) at 09/08/2020 0825 Last data filed at 09/08/2020 0551 Gross per 24 hour  Intake 2142.4 ml  Output 1400 ml  Net 742.4 ml   Filed Weights   09/06/20 0500 09/07/20 0500 09/08/20 0500  Weight: 101.2 kg 100.4 kg 101.8 kg   Physical Exam: General: critically ill appearing adult male lying in bed on vent in NAD HEENT: MM pink/moist, ETT, anicteric, pupils 57m / equal  Neuro: sedate, opens eyes  to voice, makes eye contact, drifts back to sleep CV: s1s2 RRR, no m/r/g PULM: non-labored on vent, coarse rhonchi on right, clear on left, copious pink/yellow secretions GI: soft, bsx4 active  Extremities: warm/dry, 1-2+ BLE pitting edema  Skin: no rashes or lesions  PCXR 3/3 >> images personally reviewed, ETT in good position, right opacity / PNA  Resolved Hospital Problem list   Elevated LFTs AKI Aspiration pneumonia/pneumonitis in setting of altered mental status Septic shock (off pressors as of 2/24) Cardiogenic shock   Assessment & Plan:   Acute hypoxemic respiratory failure secondary to MRSA PNA with Effusion   Parapneumonic Effusion versus Empyema  Mucus Plugging  S/p chest tube placement 2/24, tPA 2/25, 2/26 -PRVC 8cc/kg / low Vt ventilation  -daily SBT / WUA  -continue vancomycin -chest PT Q4  -monitor CXR -mucomyst to complete 3/3 (total 72 hours)  Septic +/- component Cardiogenic Shock secondary to MRSA PNA Additional sedation effect and diuresis  -levophed for MAP >65  -vancomycin as above  -follow CVP Qshift, 5 > may be nearing euvolemia   New onset heart failure with ejection fraction of 35 to 40% New onset atrial flutter/SVT secondary to sepsis Demand ischemia -continue amiodarone per Cardiology, ? Consider transition to PT  -full dose lovenox  -trend CVP as above -levophed as above  Acute Metabolic Encephalopathy Significant alcohol use and narcotic dependence Concern for withdrawal and sepsis -hold home suboxone -PT oxycodone -versed changed to propofol 3/2 to allow for more expedited WUA, limit benzo's if able -RASS Goal -1 to -2 -dilaudid gtt   Hypokalemia  -monitor, replace as indicated   Moderate Protein Calorie Malnutrition  -TF per nutrition    Elevated LFT's -trend improved, monitor  Macrocytic Anemia  -trend CBC -continue folate, MVI, thiamine   Best practice (evaluated daily)  Diet: TF Pain/Anxiety/Delirium protocol (if  indicated): PAD goal -1 to -2 VAP protocol (if indicated): Yes DVT prophylaxis: Lovenox, treatment dose GI prophylaxis: Protonix Glucose control: CBG q4h Mobility: As tolerated Disposition: ICU  Goals of Care:  Last date of multidisciplinary goals of care discussion:  Family and staff present:  Summary of discussion:  Follow up goals of care discussion due: 3/7 Code Status: Full code.    Wife, Joseph Ayala, updated 3/2 at bedside (plan for the week to update on arrival in person).  Will update 3/3 on arrival.     Critical Care Time: 34 minutes   BNoe Gens MSN, APRN, NP-C, AGACNP-BC [facility] 09/08/2020, 8:25 AM   Please see [email or web address] for pager details.   From 7A-7P if no response, please call [phone] After hours, please call ELink [phone]

## 2020-09-08 NOTE — Progress Notes (Signed)
Pharmacy Antibiotic Note  Joseph Ayala is a 55 y.o. male admitted on 08/27/2020 with pneumonia.  Pharmacy has been consulted for vancomycin dosing.  Today, 09/08/2020: Day #11 full MRSA abx Tm 99.1, WBC improved 13.2, SCr remains WNL  Significant events: 2/23 CT: necrotizing PNA, possible empyema 2/24 s/p chest tube for large pleural effusion  2/25-2/26 intra-pleural TPA/Dornase  2/26 FOB for mucus plugs 2/28 Repeat CT no large amount of residual pleural fluid present; There is still some fluid component in the posterior pleural space superior to the tip of the indwelling chest tube  3/2 Bronch no large amount of residual pleural fluid present   Plan: Continue vancomycin 2 gm IV q12 - F/u renal function, WBC, temp, culture data - Consider rechecking trough in ~5 days if tx continues, sooner if renal function changes  Height: _0  (180.3 cm) Weight: 101.8 kg (224 lb 6.9 oz) IBW/kg (Calculated) : 75.3  Temp (24hrs), Avg:99.3 F (37.4 C), Min:98.5 F (36.9 C), Max:100.4 F (38 C)  Recent Labs  Lab 09/01/20 1211 09/02/20 0549 09/04/20 0330 09/05/20 0434 09/05/20 1129 09/06/20 0505 09/07/20 0430 09/08/20 0315  WBC  --    < > 16.4* 17.3*  --  18.6* 15.2* 13.2*  CREATININE 0.45*   < > 0.59* 0.62  --  0.55* 0.66 0.65  VANCOTROUGH 14*  --   --   --  17  --   --   --    < > = values in this interval not displayed.    Estimated Creatinine Clearance: 129.7 mL/min (by C-G formula based on SCr of 0.65 mg/dL).    Allergies  Allergen Reactions  . Vicodin [Hydrocodone-Acetaminophen] Nausea And Vomiting   Antimicrobials this admission:  2/20 CTX >>2/20 2/20 Azithro >>2/23 2/20 vanc>> 2/20 zosyn>>2/23  Dose adjustments this admission:  2/24 VT 14, VP 23: AUC 443, Cmax 24.3, Cmin 13.4 on 1750 q12. Incr 2 gm q12, AUC 505. 27.7/15.4 2/28 Vanc trough = 17.  Continue Vanc 2g IV Q12h.  Microbiology results:  2/19 SARS CoV2: negative 2/20 Acid Fast Culture (BAL)x3: neg x1 2/20  BAL: few MRSA (vanc MIC < 0.5) F 2/20 MRSA PCR: positive 2/20 BCx: ngF 2/20 HIV NR 2/21 Strep pneumo UrAg: neg 2/21 legionella neg 2/24 pleural fluid: ngtd 2/26 bronch washings: abundant MRSA 2/26 bronch washings fungal cx: sent 3/2 BAL: moderate GPC in clusters  Thank you for allowing pharmacy to be a part of this patient's care.  Peggyann Juba, PharmD, BCPS Pharmacy: [phone] 09/08/2020 7:41 AM

## 2020-09-08 NOTE — Progress Notes (Signed)
Progress Note  Patient Name: Joseph Ayala Date of Encounter: 09/08/2020  [facility] Cardiologist: Donato Heinz, MD   Subjective   Intubated and sedated.   Inpatient Medications    Scheduled Meds: . acetylcysteine  2 mL Nebulization BID  . chlorhexidine gluconate (MEDLINE KIT)  15 mL Mouth Rinse BID  . Chlorhexidine Gluconate Cloth  6 each Topical Daily  . docusate  100 mg Per Tube BID  . enoxaparin (LOVENOX) injection  100 mg Subcutaneous Q12H  . feeding supplement (PIVOT 1.5 CAL)  1,000 mL Per Tube Q24H  . feeding supplement (PROSource TF)  45 mL Per Tube Daily  . folic acid  1 mg Per Tube Daily  . mouth rinse  15 mL Mouth Rinse 10 times per day  . multivitamin  15 mL Per Tube Daily  . oxyCODONE  5 mg Per Tube Q6H  . pantoprazole sodium  40 mg Per Tube Daily  . polyethylene glycol  17 g Per Tube Daily  . sodium chloride flush  10-40 mL Intracatheter Q12H  . thiamine  100 mg Per Tube Daily   Continuous Infusions: . sodium chloride Stopped (09/05/20 2352)  . amiodarone 30 mg/hr (09/08/20 0800)  . HYDROmorphone 5 mg/hr (09/08/20 0800)  . norepinephrine (LEVOPHED) Adult infusion 3 mcg/min (09/08/20 0800)  . propofol (DIPRIVAN) infusion 35 mcg/kg/min (09/08/20 0800)  . vancomycin Stopped (09/08/20 0254)   PRN Meds: acetaminophen (TYLENOL) oral liquid 160 mg/5 mL **OR** acetaminophen, albuterol, HYDROmorphone, ibuprofen, sodium chloride flush   Vital Signs    Vitals:   09/08/20 0745 09/08/20 0800 09/08/20 0816 09/08/20 0822  BP:    (!) 128/53  Pulse:  76  86  Resp:  (!) 24  20  Temp: (!) 100.7 F (38.2 C)     TempSrc: Axillary     SpO2:  96% 93% 95%  Weight:      Height:        Intake/Output Summary (Last 24 hours) at 09/08/2020 0954 Last data filed at 09/08/2020 0800 Gross per 24 hour  Intake 2282.35 ml  Output 1400 ml  Net 882.35 ml   Last 3 Weights 09/08/2020 09/07/2020 09/06/2020  Weight (lbs) 224 lb 6.9 oz 221 lb 5.5 oz 223 lb 1.7 oz  Weight  (kg) 101.8 kg 100.4 kg 101.2 kg      Telemetry    NSR since at least 3/1, no recurrent afib - Personally Reviewed  ECG    EKG 2/22 atrial fibrillation, baseline ST changed related to motion artifact - Personally Reviewed  Physical Exam   GEN: intubated and sedated  Neck: No JVD Cardiac: RRR, no murmurs, rubs, or gallops.  Respiratory: anterior exam clear to auscultation bilaterally. GI: Soft, non-distended  MS: 1+ bilateral LE edema; No deformity. Neuro:  unable to assess Psych: unable to assess   Labs    High Sensitivity Troponin:   Recent Labs  Lab 08/27/20 1953 08/28/20 0704 08/30/20 1646 08/30/20 2326  TROPONINIHS 9 17 391* 377*      Chemistry Recent Labs  Lab 09/04/20 0330 09/05/20 0434 09/06/20 0505 09/07/20 0430 09/08/20 0315  NA 146* 145 144 145 143  K 3.3* 3.4* 3.2* 3.3* 3.5  CL 111 109 110 109 109  CO2 '26 25 26 28 27  ' GLUCOSE 114* 279* 114* 114* 118*  BUN 27* 29* 24* 24* 23*  CREATININE 0.59* 0.62 0.55* 0.66 0.65  CALCIUM 8.1* 8.2* 8.2* 8.1* 8.1*  PROT 5.1* 5.0* 5.1*  --   --  ALBUMIN 1.8* 1.9* 1.9*  --   --   AST 77* 74* 69*  --   --   ALT 111* 99* 102*  --   --   ALKPHOS 217* 172* 179*  --   --   BILITOT 0.8 1.0 0.9  --   --   GFRNONAA >60 >60 >60 >60 >60  ANIONGAP '9 11 8 8 7     ' Hematology Recent Labs  Lab 09/06/20 0505 09/07/20 0430 09/08/20 0315  WBC 18.6* 15.2* 13.2*  RBC 3.20* 2.97* 2.92*  HGB 10.0* 9.4* 9.2*  HCT 32.1* 29.7* 29.5*  MCV 100.3* 100.0 101.0*  MCH 31.3 31.6 31.5  MCHC 31.2 31.6 31.2  RDW 14.6 14.6 14.7  PLT 290 319 325    BNPNo results for input(s): BNP, PROBNP in the last 168 hours.   DDimer No results for input(s): DDIMER in the last 168 hours.   Radiology    DG CHEST PORT 1 VIEW  Result Date: 09/08/2020 CLINICAL DATA:  Respiratory failure EXAM: PORTABLE CHEST 1 VIEW COMPARISON:  09/07/2020 FINDINGS: Endotracheal tube, enteric tube, and left IJ line again identified. Patient is significantly rotated.  Persistent right basilar opacification with some improvement in aeration. No pneumothorax. Stable cardiomediastinal contours. IMPRESSION: Persistent right basilar opacification with some improvement in aeration since 09/07/2020. May reflect combination of pleural effusion and atelectasis/consolidation. Electronically Signed   By: Macy Mis M.D.   On: 09/08/2020 09:50   DG CHEST PORT 1 VIEW  Result Date: 09/07/2020 CLINICAL DATA:  Status post bronchoscopy EXAM: PORTABLE CHEST 1 VIEW COMPARISON:  09/07/2020 FINDINGS: Endotracheal tube, gastric catheter and left jugular central line are again seen and stable. Cardiac shadow is stable. The overall inspiratory effort is poor. Persistent opacity in the right base is noted. No pneumothorax is seen. IMPRESSION: Persistent consolidation in the right base. No pneumothorax is noted following bronchoscopy. The remainder of the exam is stable. Electronically Signed   By: Inez Catalina M.D.   On: 09/07/2020 16:23   DG CHEST PORT 1 VIEW  Result Date: 09/07/2020 CLINICAL DATA:  Respiratory failure. EXAM: PORTABLE CHEST 1 VIEW COMPARISON:  09/06/2020. FINDINGS: Endotracheal tube, NG tube, left IJ line, right chest tube in stable position. Stable cardiomegaly. Bilateral pulmonary infiltrates/edema again noted. Persistent right base atelectasis. Slight improvement in aeration right lung on today's exam. Small bilateral pleural effusions may be present. No pneumothorax. IMPRESSION: 1. Lines and tubes including right chest tube in stable position. No pneumothorax. 2. Stable cardiomegaly. 3. Bilateral pulmonary infiltrates/edema again noted. Persistent right base atelectasis. Slight improvement in aeration in the right lung noted on today's exam. Small bilateral pleural effusions. Electronically Signed   By: Marcello Moores  Register   On: 09/07/2020 05:54    Cardiac Studies   Echo 08/30/2020 1. Left ventricular ejection fraction, by estimation, is 35 to 40%. The  left ventricle  has moderately decreased function. The left ventricle  demonstrates global hypokinesis. The left ventricular internal cavity size  was moderately dilated. Left  ventricular diastolic parameters are indeterminate.  2. Right ventricular systolic function is mildly reduced. The right  ventricular size is moderately enlarged. There is mildly elevated  pulmonary artery systolic pressure.  3. Left atrial size was mildly dilated.  4. Right atrial size was moderately dilated.  5. The mitral valve is normal in structure. Mild mitral valve  regurgitation. No evidence of mitral stenosis.  6. The aortic valve has an indeterminant number of cusps. There is mild  calcification of the aortic valve.  Aortic valve regurgitation is not  visualized. Mild aortic valve sclerosis is present, with no evidence of  aortic valve stenosis.  7. Aortic dilatation noted. There is mild to moderate dilatation of the  ascending aorta, measuring 44 mm.  8. The inferior vena cava is dilated in size with <50% respiratory  variability, suggesting right atrial pressure of 15 mmHg.   Comparison(s): No prior Echocardiogram.   Conclusion(s)/Recommendation(s): EF reduced, with global hypokinesis.  Thoracic aortic aneurysm, noted previously in chart. Findings communicated  with Dr. Loanne Drilling.   Patient Profile     55 y.o. male with PMH of HTN, necrotizing myopathy on suboxone, EtOH abuse and low testosterone presented with MRSA PNA and found to have new LV dysfunction and afib.   Assessment & Plan    1. MRSA PNA and sepsis:   - required chest tube placement for R pleural effusion and possible empyema  - intubated on 2/21 and sedation. On IV abx.   - bronchoscopy 2/27 for mucus plug, bronchoscopy with lavage 09/07/2020  - per PCCM  2. Acute combined CHF  - Echo 08/30/2020 with EF 35-40%, mild LAE, mild MR, dilated ascending aorta 44 mm  - will consider CHF meds titration once able to come off of pressors. Plan to  repeat echo as outpatient in 3 month on maximally tolerated CHF meds, if EF remain low, will consider ischemic workup then.  - last dose of lasix was on 3/1, diureses limited by hypotension on 3/2  - BP improved to 120s on Norepi. Still volume overloaded on exam with LE edema, lung largely clear but CXR still shows R basilar opacity receiving mucomyst, would let his BP continue improve for today and consider a dose of lasix 86m IV tomorrow.   3. Newly diagnosed atrial fibrillation: converted on IV amiodarone. Likely related to #1. On lovenox  4. HTN  5. Aortic aneurysm       For questions or updates, please contact CMilfordPlease consult [email or web address] for contact info under        Signed, HAlmyra Deforest [facility] 09/08/2020, 9:54 AM

## 2020-09-09 ENCOUNTER — Other Ambulatory Visit: Payer: Self-pay | Admitting: *Deleted

## 2020-09-09 ENCOUNTER — Inpatient Hospital Stay (HOSPITAL_COMMUNITY): Payer: Medicare HMO

## 2020-09-09 DIAGNOSIS — J850 Gangrene and necrosis of lung: Secondary | ICD-10-CM

## 2020-09-09 DIAGNOSIS — J9601 Acute respiratory failure with hypoxia: Secondary | ICD-10-CM | POA: Diagnosis not present

## 2020-09-09 DIAGNOSIS — F1190 Opioid use, unspecified, uncomplicated: Secondary | ICD-10-CM | POA: Diagnosis not present

## 2020-09-09 DIAGNOSIS — J189 Pneumonia, unspecified organism: Secondary | ICD-10-CM | POA: Diagnosis not present

## 2020-09-09 DIAGNOSIS — J15212 Pneumonia due to Methicillin resistant Staphylococcus aureus: Secondary | ICD-10-CM | POA: Diagnosis present

## 2020-09-09 HISTORY — DX: Gangrene and necrosis of lung: J85.0

## 2020-09-09 HISTORY — DX: Pneumonia due to methicillin resistant Staphylococcus aureus: J15.212

## 2020-09-09 LAB — BLOOD GAS, ARTERIAL
Acid-base deficit: 0.2 mmol/L (ref 0.0–2.0)
Bicarbonate: 24.7 mmol/L (ref 20.0–28.0)
Drawn by: 29503
FIO2: 40
MECHVT: 600 mL
O2 Saturation: 92.5 %
PEEP: 5 cmH2O
Patient temperature: 98.6
RATE: 16 resp/min
pCO2 arterial: 44.5 mmHg (ref 32.0–48.0)
pH, Arterial: 7.363 (ref 7.350–7.450)
pO2, Arterial: 71.7 mmHg — ABNORMAL LOW (ref 83.0–108.0)

## 2020-09-09 LAB — GLUCOSE, CAPILLARY
Glucose-Capillary: 100 mg/dL — ABNORMAL HIGH (ref 70–99)
Glucose-Capillary: 88 mg/dL (ref 70–99)
Glucose-Capillary: 98 mg/dL (ref 70–99)
Glucose-Capillary: 97 mg/dL (ref 70–99)
Glucose-Capillary: 102 mg/dL — ABNORMAL HIGH (ref 70–99)
Glucose-Capillary: 102 mg/dL — ABNORMAL HIGH (ref 70–99)

## 2020-09-09 LAB — CBC
HCT: 28.5 % — ABNORMAL LOW (ref 39.0–52.0)
Hemoglobin: 8.7 g/dL — ABNORMAL LOW (ref 13.0–17.0)
MCH: 31.3 pg (ref 26.0–34.0)
MCHC: 30.5 g/dL (ref 30.0–36.0)
MCV: 102.5 fL — ABNORMAL HIGH (ref 80.0–100.0)
Platelets: 388 10*3/uL (ref 150–400)
RBC: 2.78 MIL/uL — ABNORMAL LOW (ref 4.22–5.81)
RDW: 14.8 % (ref 11.5–15.5)
WBC: 13.7 10*3/uL — ABNORMAL HIGH (ref 4.0–10.5)
nRBC: 0 % (ref 0.0–0.2)

## 2020-09-09 LAB — BASIC METABOLIC PANEL
Anion gap: 7 (ref 5–15)
BUN: 27 mg/dL — ABNORMAL HIGH (ref 6–20)
CO2: 27 mmol/L (ref 22–32)
Calcium: 8.1 mg/dL — ABNORMAL LOW (ref 8.9–10.3)
Chloride: 111 mmol/L (ref 98–111)
Creatinine, Ser: 0.73 mg/dL (ref 0.61–1.24)
GFR, Estimated: >60 mL/min (ref >60)
Glucose, Bld: 105 mg/dL — ABNORMAL HIGH (ref 70–99)
Potassium: 3.3 mmol/L — ABNORMAL LOW (ref 3.5–5.1)
Sodium: 145 mmol/L (ref 135–145)

## 2020-09-09 IMAGING — DX DG ABD PORTABLE 1V
1 series · 1 of 1 positions shown · non-contrast
Comparison: Single-view of the abdomen [DATE].

CLINICAL DATA: Status post feeding tube placement.

EXAM:
PORTABLE ABDOMEN - 1 VIEW

[abdomen kub]
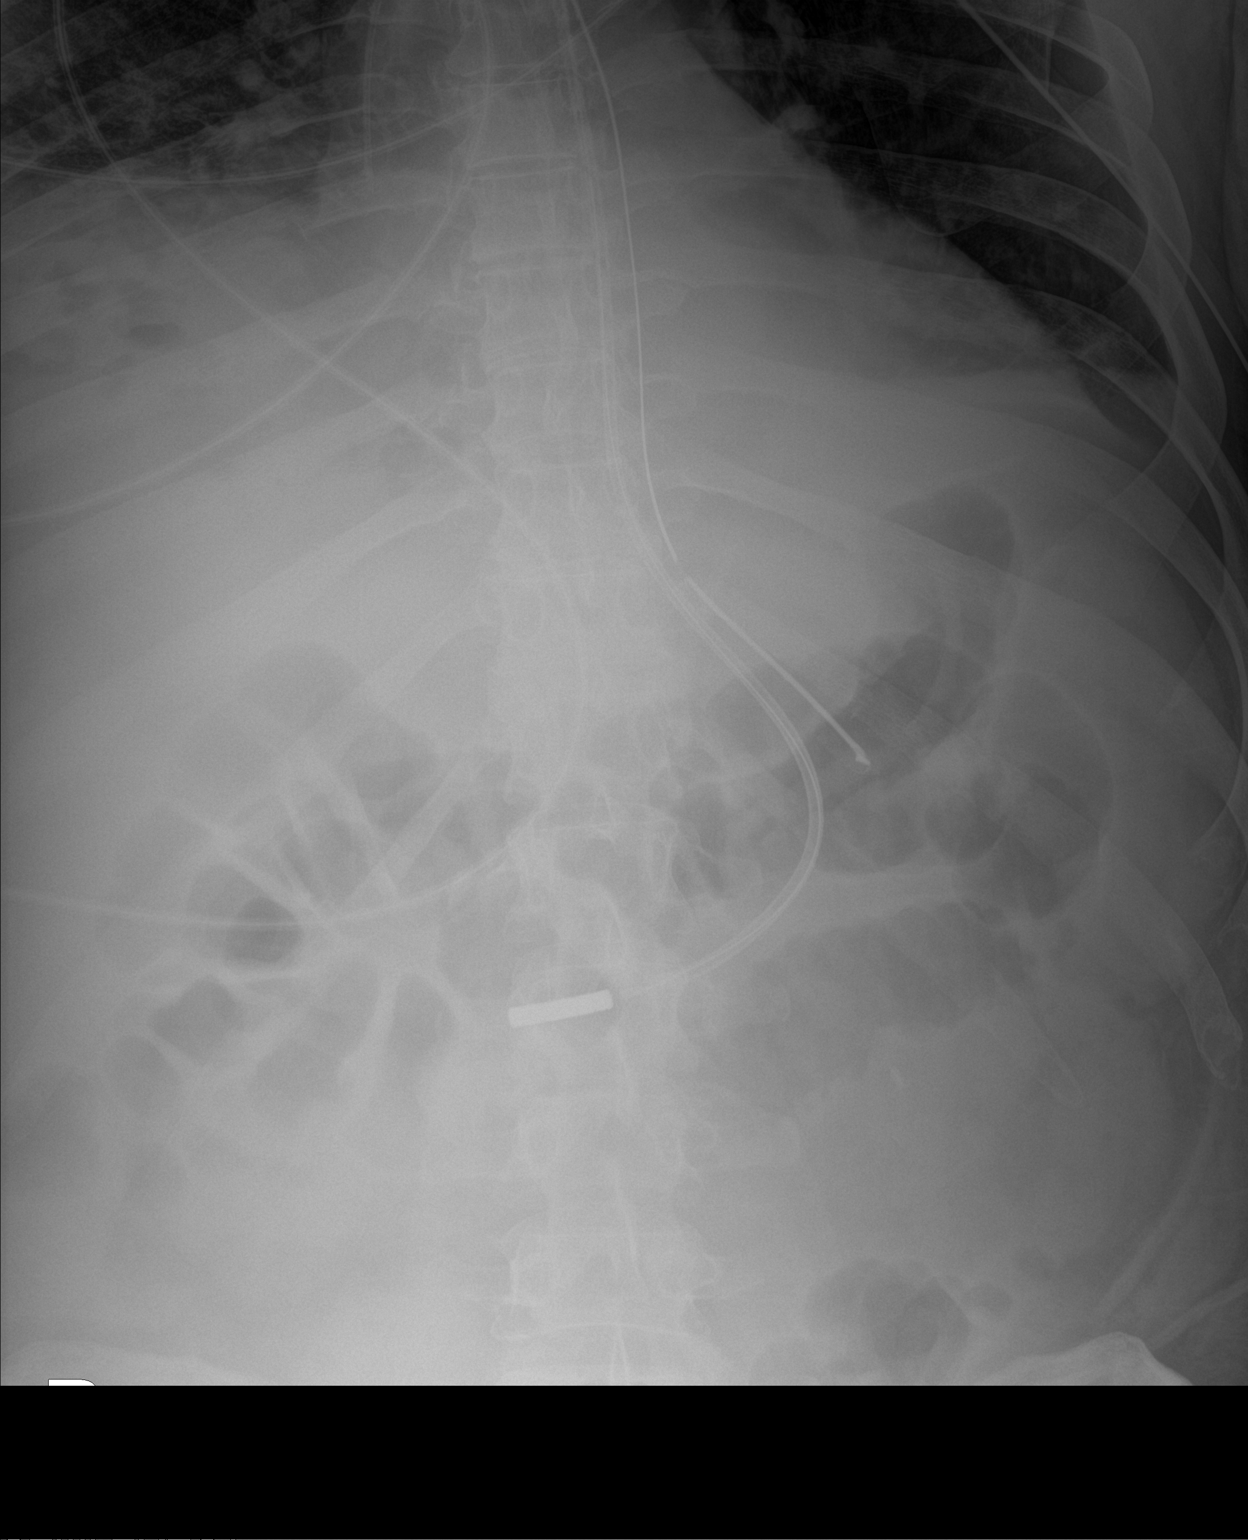

[1 of 1 positions shown; findings below may reference images not displayed]

FINDINGS: Feeding tube is in place with the tip in the distal body of the
stomach directed toward the duodenum. NG tube remains in good
position.
IMPRESSION: As above.

## 2020-09-09 IMAGING — DX DG CHEST 1V PORT
1 series · 1 of 1 positions shown · non-contrast
Comparison: [DATE]

CLINICAL DATA: Respiratory failure

EXAM:
PORTABLE CHEST 1 VIEW

[chest ap]
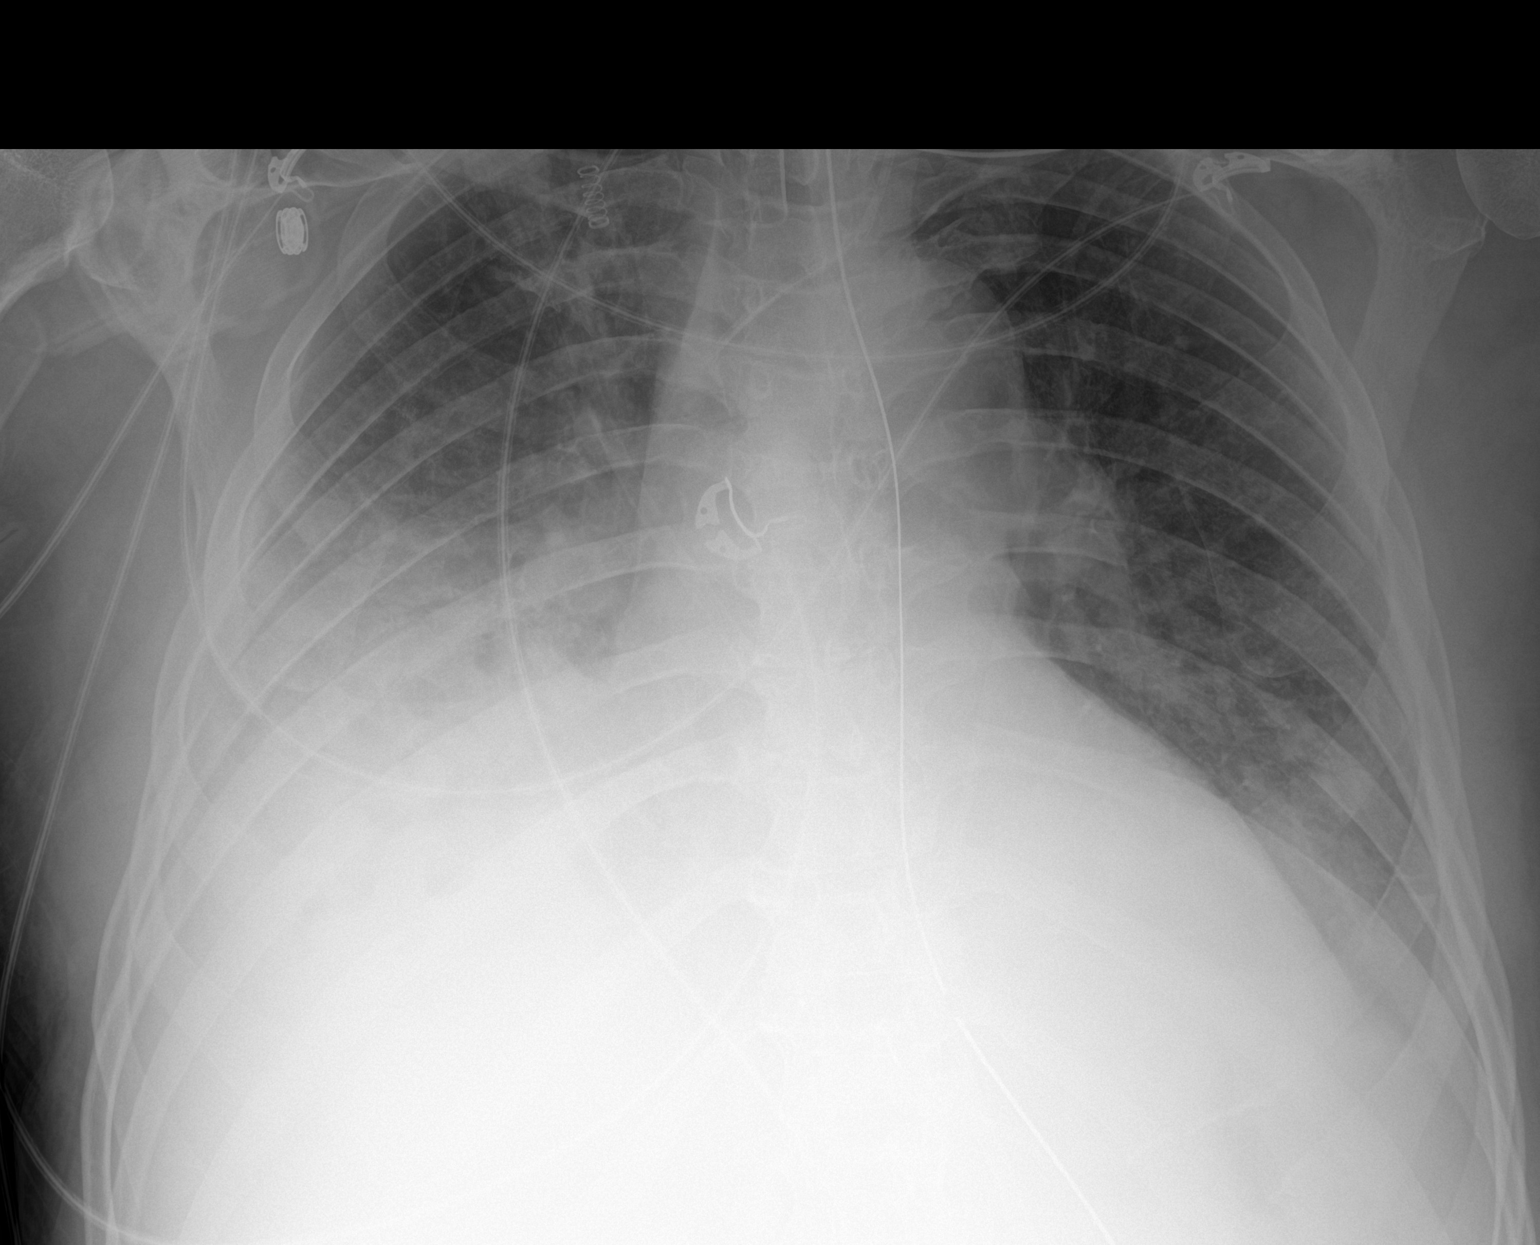

[1 of 1 positions shown; findings below may reference images not displayed]

FINDINGS: Cardiac shadow is stable. Endotracheal tube, gastric catheter and
left jugular central line are again seen and stable. Bilateral
pleural effusions are noted right considerably greater than left.
Persistent parenchymal opacification in the right base is noted as
well. Mild vascular congestion is seen.
IMPRESSION: Overall stable appearance of the chest when compare with the prior
exam. Persistent right basilar opacification is noted with
associated bilateral effusions.

## 2020-09-09 MED ORDER — SODIUM CHLORIDE 0.9% FLUSH
10.0000 mL | Freq: Two times a day (BID) | INTRAVENOUS | Status: DC
  Administered 2020-09-09 – 2020-09-10 (×4): 10 mL
  Administered 2020-09-11: 40 mL
  Administered 2020-09-11: 10 mL
  Administered 2020-09-12: 09:00:00 30 mL
  Administered 2020-09-12 – 2020-09-13 (×2): 20 mL
  Administered 2020-09-14: 10 mL
  Administered 2020-09-14: 20 mL
  Administered 2020-09-15 – 2020-09-16 (×3): 10 mL
  Administered 2020-09-16: 20 mL
  Administered 2020-09-17 – 2020-09-26 (×11): 10 mL
  Administered 2020-09-26: 20 mL
  Administered 2020-09-27: 10 mL
  Administered 2020-09-28: 30 mL
  Administered 2020-09-29: 10 mL

## 2020-09-09 MED ORDER — POTASSIUM CHLORIDE 10 MEQ/50ML IV SOLN
10.0000 meq | INTRAVENOUS | Status: AC
  Administered 2020-09-09 (×4): 10 meq via INTRAVENOUS
  Filled 2020-09-09 (×4): qty 50

## 2020-09-09 MED ORDER — SODIUM CHLORIDE 0.9% FLUSH: 10.0000 mL | INTRAVENOUS | Status: DC | PRN

## 2020-09-09 MED ORDER — LINEZOLID 600 MG/300ML IV SOLN
600.0000 mg | Freq: Two times a day (BID) | INTRAVENOUS | Status: DC
  Administered 2020-09-09 – 2020-09-12 (×7): 600 mg via INTRAVENOUS
  Filled 2020-09-09 (×7): qty 300

## 2020-09-09 MED ORDER — POTASSIUM CHLORIDE 20 MEQ PO PACK
20.0000 meq | PACK | ORAL | Status: AC
  Administered 2020-09-09 (×2): 20 meq
  Filled 2020-09-09 (×2): qty 1

## 2020-09-09 NOTE — Patient Outreach (Signed)
[facility]) Care Management  09/09/2020  Joseph Ayala 16-Dec-1965 [id number]   Transition of Care Referral. Referral received: 08/29/20   Referral received for transition of care, patient remains admitted at [facility] since 08/27/20.    Plan Will communicate with [facility] Liaison and will  follow patient for discharge disposition for transition of care .    [name], RN, BSN  [facility] Care Management,Care Management Coordinator  [phone]- Mobile [phone]- Toll Free Main Office

## 2020-09-09 NOTE — Progress Notes (Signed)
K 3.3 Electrolytes replaced per protocol 

## 2020-09-09 NOTE — Progress Notes (Signed)
Peripherally Inserted Central Catheter Placement  The IV Nurse has discussed with the patient and/or persons authorized to consent for the patient, the purpose of this procedure and the potential benefits and risks involved with this procedure.  The benefits include less needle sticks, lab draws from the catheter, and the patient may be discharged home with the catheter. Risks include, but not limited to, infection, bleeding, blood clot (thrombus formation), and puncture of an artery; nerve damage and irregular heartbeat and possibility to perform a PICC exchange if needed/ordered by physician.  Alternatives to this procedure were also discussed.  Bard Power PICC patient education guide, fact sheet on infection prevention and patient information card has been provided to patient /or left at bedside.    PICC Placement Documentation  PICC Triple Lumen 09/09/20 PICC Left Basilic 46 cm 0 cm (Active)  Indication for Insertion or Continuance of Line Vasoactive infusions 09/09/20 1024  Exposed Catheter (cm) 0 cm 09/09/20 1024  Site Assessment Clean;Dry;Intact 09/09/20 1024  Lumen #1 Status Flushed;Saline locked;Blood return noted 09/09/20 1024  Lumen #2 Status Flushed;Saline locked;Blood return noted 09/09/20 1024  Lumen #3 Status Flushed;Saline locked;Blood return noted 09/09/20 1024  Dressing Type Transparent;Securing device 09/09/20 1024  Dressing Status Clean;Dry;Intact 09/09/20 1024  Antimicrobial disc in place? Yes 09/09/20 1024  Dressing Intervention New dressing 09/09/20 1024  Dressing Change Due 09/16/20 09/09/20 1024       Annett Fabian 09/09/2020, 10:26 AM

## 2020-09-09 NOTE — Progress Notes (Addendum)
NAME:  Joseph Ayala, MRN:  [id number], DOB:  1966-04-21, LOS: 12 ADMISSION DATE:  08/27/2020, CONSULTATION DATE:  08/28/20 REFERRING MD:  Darliss Cheney, MD CHIEF COMPLAINT:  Acute hypoxemic respiratory failure  Brief History:  55 year old male on Suboxone for necrotizing myopathy admitted 2/19 with acute hypoxemic respiratory failure secondary to MRSA community-acquired pneumonia / aspiration. PCCM initially consulted for evaluation for bronchoscopy for possible mucous plugging. Transferred to ICU after worsening respiratory failure and mental status requiring intubation on 2/21. ICU course complicated by hypotension requiring pressors, chest tube placement for empyema s/p intrapleural lytics.   Past Medical History:  Hypertension, necrotizing myopathy, opioid use on Suboxone  Significant Hospital Events:  2/19 Arrived to ED for cough and chest pain x 1 week 2/20 Admitted to [facility] in early am. Intubated. Bronch 2/22 Worsening respiratory status and vasopressor requirement. Agitation on vent 2/23 Improving pressor requirement 2/24 Off pressors. Right chest tube placement. Propofol changed versed as TRG increased 2/25 About 650 out of CT since insertion. Stable vent requirements. Sedation and agitation a challenge / librium added. Fever. TPA into chest tube 2/26 FOB for mucus plugging 2/27 Briefly on neo overnight / thought sedation related  2/28 Repeat CT with little residual pleural fluid, largely consolidated lung. On vent. Oral oxy added.  3/01 Ceiling of IV gtt's reduced, weaning on PSV 10/5 - weaned for 8h. Diuresis.   3/02 Sedation changed, hypotension, levophed initiated. FOB with BAL, chest tube removed.   3/04 Discontinue aline, PICC placed LUE  Consults:  TRH PCCM  Procedures:  ETT 2/21 >>  R Radial Aline 2/20 >> 3/4  L IJ TLC 2/21 >> 3/4 R CT 2/24 >> 3/2 LUE PICC 3/4 >>   Significant Diagnostic Tests:   CTA Chest 2/19 >> right sided consolidation and ground glass  opacities in the right and middle lobe. Background centrilobular emphysema present. No pulmonary embolism  CT CAP 2/24 >> Interval enlargement of right pleural effusion with some loculation. Progression of right airspace disease with cavitation in right middle/lower lobe. Anasarca  CT Chest w/o 2/28 >> no large amount of residual pleural fluid present with most of the low-density abnormality in the right mid to lower chest felt to represent necrotic and consolidated RLL, RML. There is still some fluid component in the posterior pleural space superior to the tip of the indwelling chest tube  Micro Data:  Blood culture 2/19 >> negative  BAL resp cx 2/20 >> MRSA BAL fungal cx 2/20 >> no fungal elements observed on stain >> BAL AFB cx 2/20 >> no AFB on smear >>  Urine strep ag 2/21 >> neg Urine legionella ag 2/21 >> neg Pleural fluid 2/24 >> ? If sent  BAL 2/26 >> MRSA  BAL 3/2 >> staph aureus >>   Antimicrobials:  Ceftriaxone 2/20 >> 2/21 Azithro 2/20 >> 2/21 Vanc 2/21 >> 3/4  Zosyn 2/21 >> 2/23 Linezolid 3/4 >>   Interim History / Subjective:  CVP 4  I/O 1.3L UOP, 500 ml stool, +818m in last 24 hours  PSV wean 10/5, 40% Glucose range 88-105 Tmax 102.5 / WBC 13.7   Objective   Blood pressure (!) 97/54, pulse 74, temperature 100 F (37.8 C), temperature source Axillary, resp. rate (!) 24, height _0  (1.803 m), weight 101.9 kg, SpO2 96 %. CVP:  [4 mmHg] 4 mmHg  Vent Mode: PRVC FiO2 (%):  [40 %] 40 % Set Rate:  [24 bmp] 24 bmp Vt Set:  [600 mL] 600 mL PEEP:  [  5 cmH20] 5 cmH20 Pressure Support:  [10 cmH20] 10 cmH20 Plateau Pressure:  [16 cmH20-20 cmH20] 20 cmH20   Intake/Output Summary (Last 24 hours) at 09/09/2020 1027 Last data filed at 09/09/2020 0845 Gross per 24 hour  Intake 1641.02 ml  Output 2355 ml  Net -713.98 ml   Filed Weights   09/07/20 0500 09/08/20 0500 09/09/20 0400  Weight: 100.4 kg 101.8 kg 101.9 kg   Physical Exam: General: ill appearing adult male  lying in bed in NAD on vent  HEENT: MM pink/moist, ETT, pupils 6m, anicteric  Neuro: sedate  CV: s1s2 RRR, SR 80's, no m/r/g PULM: non-labored on PSV 10/5, bronchial breath sounds on right / coarse, clear on left  GI: soft, bsx4 active  Extremities: warm/dry, 1-2+ BLE pitting edema  Skin: non-blanching (improved) erythema on feet, multiple tattoos  PCXR 3/4 >> ETT in good position, R lower opacity  Resolved Hospital Problem list   Elevated LFTs AKI Aspiration pneumonia/pneumonitis in setting of altered mental status Septic shock (off pressors as of 2/24) Cardiogenic shock   Assessment & Plan:   Acute hypoxemic respiratory failure secondary to MRSA PNA with Effusion   Parapneumonic Effusion versus Empyema  Mucus Plugging  S/p chest tube placement 2/24, tPA 2/25, 2/26.  Chest tube removed 3/2.  -PRVC 8cc/kg  -daily SBT / WUA  -change abx to linezolid with fever to 102 on vanco -chest PT Q4  -follow intermittent CXR -wife is going to think about concept of trach / timing etc.  We will follow up.   Septic +/- component Cardiogenic Shock secondary to MRSA PNA Additional sedation effect and diuresis  -continue levophed for MAP >65. Currently on 159m  -linezolid as above  -follow CVP for diuresis given ongoing third spacing in the setting of critical illness  -hold diuresis 3/4   New onset heart failure with ejection fraction of 35 to 40% New onset atrial flutter/SVT secondary to sepsis Demand ischemia Thoracic Aneurysm - will need outpatient follow up  -continue amiodarone PT  -Cardiology signed off > would like to called back once more stable / able to titrate heart failure medications & to arrange follow up  -continue full dose lovenox  -tele monitoring  -levophed as above  -CVP Q shift  -discontinue TLC with PICC in place -discontinue aline   Acute Metabolic Encephalopathy Significant alcohol use and narcotic dependence Concern for withdrawal and sepsis -hold home  suboxone -PT oxycodone  -continue propofol + dilaudid gtt  -changed from versed in anticipation of weaning (quick on/off for WUA) -have asked Palliative Care to review current medication use and assist with plan for returning to suboxone -RASS goal -1 to -2   Hypokalemia  -KCL 3/4  -follow electrolytes, replace as indicated   Moderate Protein Calorie Malnutrition  -TF per Nutrition   -change to small bore gastric feeding tube to allow for ongoing medication dosage if extubated / remove oral gastric tube   Elevated LFT's -monitor intermittently, improved   Macrocytic Anemia  -trend CBC  -folate, MVI, thiamine   Best practice (evaluated daily)  Diet: TF Pain/Anxiety/Delirium protocol (if indicated): PAD goal -1 to -2 VAP protocol (if indicated): Yes DVT prophylaxis: Lovenox, treatment dose GI prophylaxis: Protonix Glucose control: CBG q4h Mobility: As tolerated Disposition: ICU  Goals of Care:  Last date of multidisciplinary goals of care discussion:  Family and staff present:  Summary of discussion:  Follow up goals of care discussion due: 3/7 Code Status: Full code.    Wife, Ginger,  updated 3/4 on plan of care (plan for the week is to update on arrival in person).    Critical Care Time: 2 minutes   Noe Gens, MSN, APRN, NP-C, AGACNP-BC [facility] 09/09/2020, 10:27 AM   Please see [email or web address] for pager details.   From 7A-7P if no response, please call [phone] After hours, please call ELink [phone]

## 2020-09-09 NOTE — Progress Notes (Signed)
Nutrition Follow-up  DOCUMENTATION CODES:   Not applicable  INTERVENTION:  - at this time, continue Pivot 1.5 @ 60 ml/hr with 45 ml Prosource TF once/day.  - this regimen + kcal from current propofol rate is providing 2599 kcal and 146 grams protein. - free water flush to continue to be per CCM.  NUTRITION DIAGNOSIS:   Inadequate oral intake related to inability to eat as evidenced by NPO status. -ongoing  GOAL:   Patient will meet greater than or equal to 90% of their needs -met with TF regimen  MONITOR:   Vent status,TF tolerance,Labs,Weight trends  ASSESSMENT:   55 year old male on Suboxone for necrotizing myopathy who presents with acute hypoxemic respiratory failure secondary to community-acquired pneumonia/aspiration. PCCM initially consulted for evaluation for bronchoscopy for possible mucous plugging. Transferred to ICU after worsening respiratory failure and mental status requiring intubation on 2/21.  Significant Events: 2/19- admission for CAP 2/20- intubation; OGT placement 2/21- initial RD assessment 2/22- initiation of trickle TF 2/23- off pressors 2/24- transitioned from propofol to versed d/t rising TGs; chest tube placed 2/26- mucus plugging 2/28- repeat CT showed small residual pleural fluid and largely consolidated lung 3/2- chest tube removed   Patient discussed in rounds this AM. He remains intubated with OGT in place. Plan to replace OGT with small bore NGT today.   He is receiving Pivot 1.5 @ 60 ml/hr with 45 ml Prosource TF once/day and 20 ml free water QID. This regimen is providing 2200 kcal, 146 grams protein, and 1173 ml free water.  Weight has been fairly stable since 2/25. Mild pitting edema to BLE documented in the edema section of flow sheet.   Per notes: - double lumen PICC placed in L basilic today (3/4) - MRSA PNA - new onset heart failure - thoracic aneurysm with need for outpatient follow-up - acute metabolic encephalopathy -  macrocytic anemia    Patient is currently intubated on ventilator support MV: 10 L/min Temp (24hrs), Avg:101.1 F (38.4 C), Min:100 F (37.8 C), Max:102.6 F (39.2 C) Propofol: 15.1 ml/hr (399 kcal) BP: 100/52 and MAP: 67  Labs reviewed; CBGs: 100, 88, 98 mg/dl, K: 3.3 mmol/l, BUN: 27 mg/dl, Ca: 8.1 mg/dl. Medications reviewed; 100 mg colace BID, 15 ml multivitamin/day, 40 mg protonix/day, 17 g miralax/day, 20 mEq Klor-Con x2 doses 3/4, 10 mEq IV KCl x4 runs 3/4, 100 mg thiamine/day.  Drip; propofol @ 25 mcg/kg/min.    Diet Order:   Diet Order            Diet NPO time specified  Diet effective now                 EDUCATION NEEDS:   No education needs have been identified at this time  Skin:  Skin Assessment: Skin Integrity Issues: Skin Integrity Issues:: Stage II,Stage I Stage I: R elbow (newly documented 3/2) Stage II: bilateral buttocks (newly documented 3/2)  Last BM:  3/3 (type 7; 700 ml output)  Height:   Ht Readings from Last 1 Encounters:  09/06/20 '5\' 11"'  (1.803 m)    Weight:   Wt Readings from Last 1 Encounters:  09/09/20 101.9 kg    Estimated Nutritional Needs:  Kcal:  2409 kcal Protein:  140-155g Fluid:  >/= 2 L/day     Jarome Matin, MS, RD, LDN, CNSC Inpatient Clinical Dietitian RD pager # available in [place]  After hours/weekend pager # available in [facility]

## 2020-09-09 NOTE — Progress Notes (Signed)
eLink Physician-Brief Progress Note Patient Name: Joseph Ayala DOB: [birth year] MRN: [id number]   Date of Service  09/09/2020  HPI/Events of Note  Multiple issues: 1. Request to review KUB for OGT placement. OGT tip im mid stomach and 2. Urinary retention - Bladder scan with > 800 mL residual.  eICU Interventions  Plan:  1. OK to use OGT. 2. I/O cath PRN.      Intervention Category Major Interventions: Other:  Joseph Ayala Dennard Nip 09/09/2020, 1:09 AM

## 2020-09-09 NOTE — Consult Note (Signed)
Palliative Care consultation received for opioid-suboxone transition and for additional recommendations regarding sedation challenges which are making weaning difficult. My chart review has revealed that Joseph Ayala has very complicated neuromotor and neuropsychiatric issues that have been largely uncharacterized prior to this admission. I suspect he may even have a history of TBI, early cognitive decline or undiagnosed psychiatric illness based on documentation of his behaviors reported in the outpatient setting. 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 I would consider alcohol and drug induced myopathies to be a much higher probability.  Based on my previous experience with patients who have challenging vent weaning due to sedation withdrawal and especially with opioid tolerance, I strongly recommend initiating a Ketamine bolus followed by rapid titration of a continuous infusion until complete sedation is achieved. Once ketamine is titrated to an effective dose, you can begin to gradually decrease the propofol, the hydromorphone and other sedatives. Would stop administration of oral oxycodone now as it is not being given in a dose that is likely contributing to comfort or preventing withdrawal. Would maintain Ketamine at lowest possible dose that controls his agitation until patient is successfully extubated or off the vent.  Given the severity and complexity of his neuropsychiatric disease and polysubstance use disorders, further management or recommendations other than those recommended above is outside of the scope of palliative care medicine.  Consider placing a psych consult with a specific request for addiction medicine specialist consultation. Patient is prescribed Suboxone by the [facility]. I recommend contacting Dr. Erlinda Hong for recommendations on resuming his suboxone and appropriate dosing based on their protocols.  Please call with questions or if we can be of further assistance in supporting this patient and his family during this critical illness.  Anderson Malta, DO Palliative Medicine [phone]  Time: 35 minutes Greater than 50%  of this time was spent counseling and coordinating care related to the above assessment and plan.

## 2020-09-10 ENCOUNTER — Inpatient Hospital Stay (HOSPITAL_COMMUNITY): Payer: Medicare HMO

## 2020-09-10 DIAGNOSIS — J850 Gangrene and necrosis of lung: Secondary | ICD-10-CM | POA: Diagnosis not present

## 2020-09-10 LAB — GLUCOSE, CAPILLARY
Glucose-Capillary: 109 mg/dL — ABNORMAL HIGH (ref 70–99)
Glucose-Capillary: 65 mg/dL — ABNORMAL LOW (ref 70–99)
Glucose-Capillary: 74 mg/dL (ref 70–99)
Glucose-Capillary: 82 mg/dL (ref 70–99)
Glucose-Capillary: 87 mg/dL (ref 70–99)
Glucose-Capillary: 98 mg/dL (ref 70–99)

## 2020-09-10 LAB — CULTURE, RESPIRATORY W GRAM STAIN

## 2020-09-10 LAB — COMPREHENSIVE METABOLIC PANEL
ALT: 82 U/L — ABNORMAL HIGH (ref 0–44)
AST: 65 U/L — ABNORMAL HIGH (ref 15–41)
Albumin: 1.9 g/dL — ABNORMAL LOW (ref 3.5–5.0)
Alkaline Phosphatase: 186 U/L — ABNORMAL HIGH (ref 38–126)
Anion gap: 5 (ref 5–15)
BUN: 21 mg/dL — ABNORMAL HIGH (ref 6–20)
CO2: 26 mmol/L (ref 22–32)
Calcium: 7.8 mg/dL — ABNORMAL LOW (ref 8.9–10.3)
Chloride: 108 mmol/L (ref 98–111)
Creatinine, Ser: 0.64 mg/dL (ref 0.61–1.24)
GFR, Estimated: >60 mL/min (ref >60)
Glucose, Bld: 114 mg/dL — ABNORMAL HIGH (ref 70–99)
Potassium: 4 mmol/L (ref 3.5–5.1)
Sodium: 139 mmol/L (ref 135–145)
Total Bilirubin: 0.8 mg/dL (ref 0.3–1.2)
Total Protein: 5.3 g/dL — ABNORMAL LOW (ref 6.5–8.1)

## 2020-09-10 LAB — CBC
HCT: 27.4 % — ABNORMAL LOW (ref 39.0–52.0)
Hemoglobin: 8.7 g/dL — ABNORMAL LOW (ref 13.0–17.0)
MCH: 32.7 pg (ref 26.0–34.0)
MCHC: 31.8 g/dL (ref 30.0–36.0)
MCV: 103 fL — ABNORMAL HIGH (ref 80.0–100.0)
Platelets: 372 10*3/uL (ref 150–400)
RBC: 2.66 MIL/uL — ABNORMAL LOW (ref 4.22–5.81)
RDW: 15 % (ref 11.5–15.5)
WBC: 13.2 10*3/uL — ABNORMAL HIGH (ref 4.0–10.5)
nRBC: 0 % (ref 0.0–0.2)

## 2020-09-10 IMAGING — DX DG CHEST 1V PORT
1 series · 1 of 1 positions shown · non-contrast
Comparison: [DATE]

CLINICAL DATA: Acute respiratory failure

EXAM:
PORTABLE CHEST 1 VIEW

[chest ap]
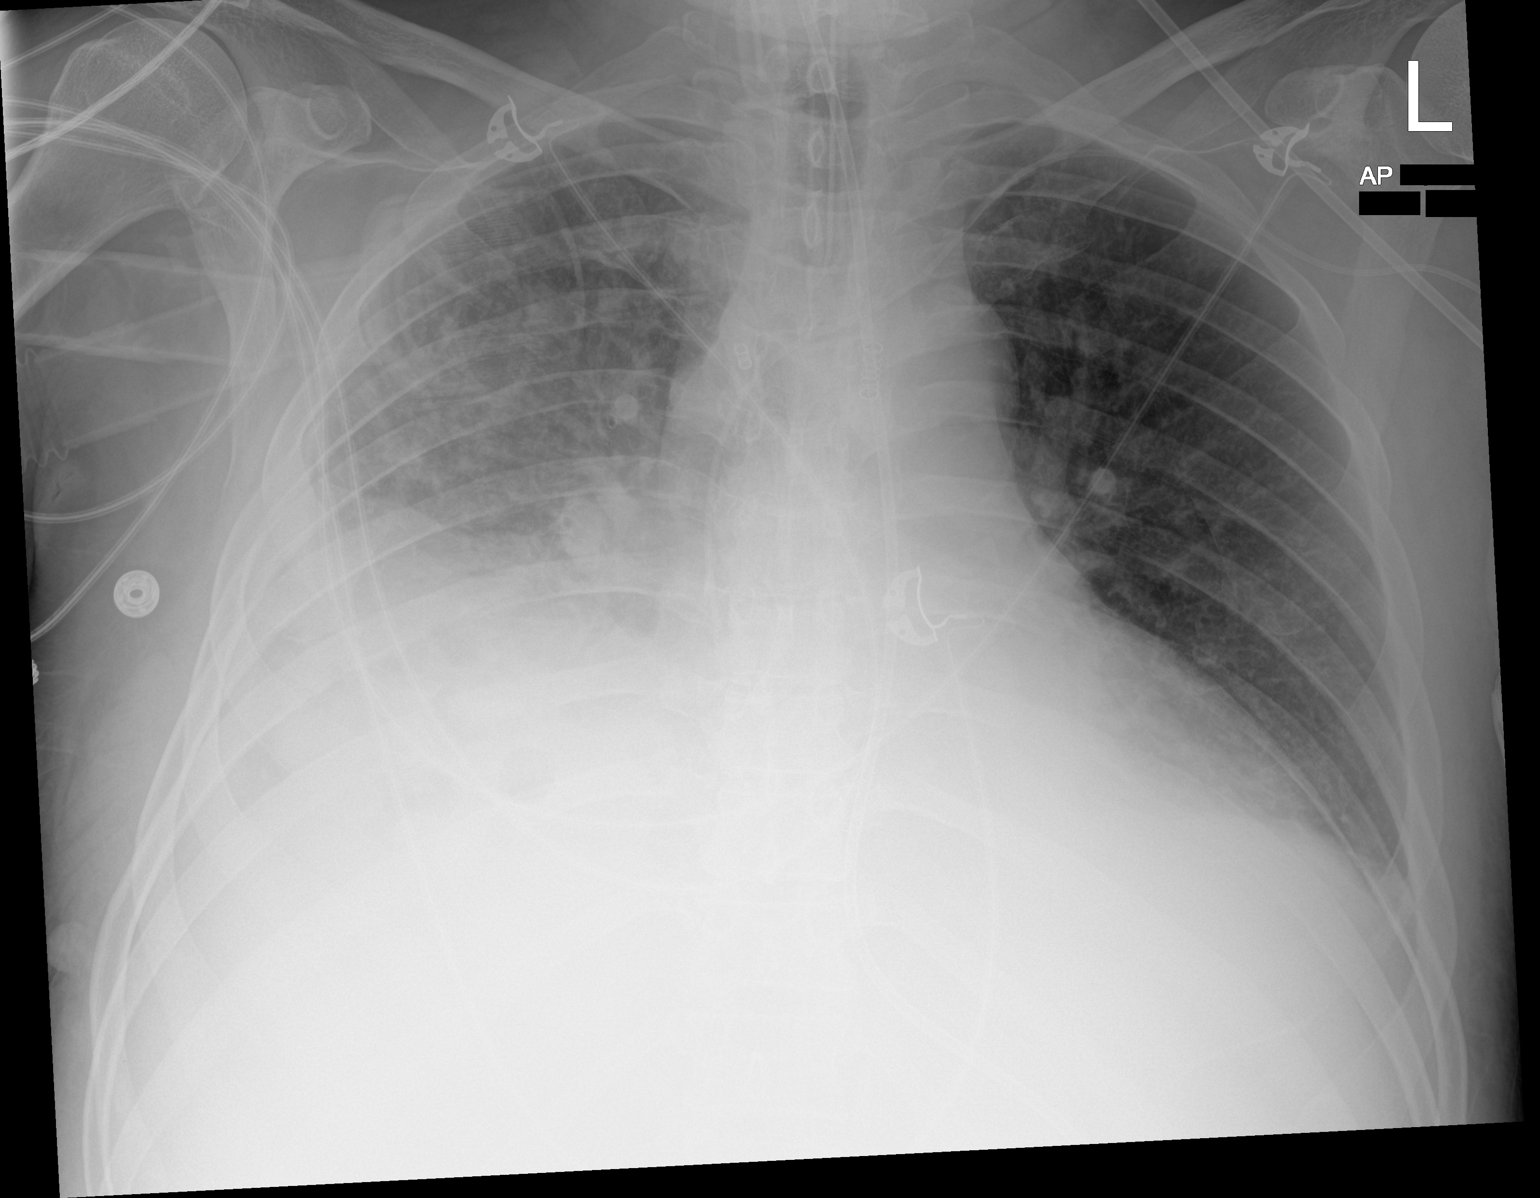

[1 of 1 positions shown; findings below may reference images not displayed]

FINDINGS: Two endotracheal tube is seen 5.7 cm above the carina. Nasoenteric
feeding tube extends into the upper abdomen. Left upper extremity
PICC line tip seen within the superior right atrium. Lung volumes
are small, however, pulmonary insufflation is stable since prior
examination with right-sided volume loss. Moderate right pleural
effusion is present. Superimposed pulmonary infiltrate throughout
the right lung is stable. Left lung is clear. No pneumothorax. No
pleural effusion on the left. Cardiac size within normal limits.
IMPRESSION: Stable support lines and tubes.

Asymmetric right lung pulmonary infiltrate, likely infectious, with
associated moderate right pleural effusion.

## 2020-09-10 IMAGING — DX DG ABDOMEN 1V
1 series · 1 of 1 positions shown · non-contrast
Comparison: [DATE]

CLINICAL DATA: NG tube placement

EXAM:
ABDOMEN - 1 VIEW

[abdomen kub]
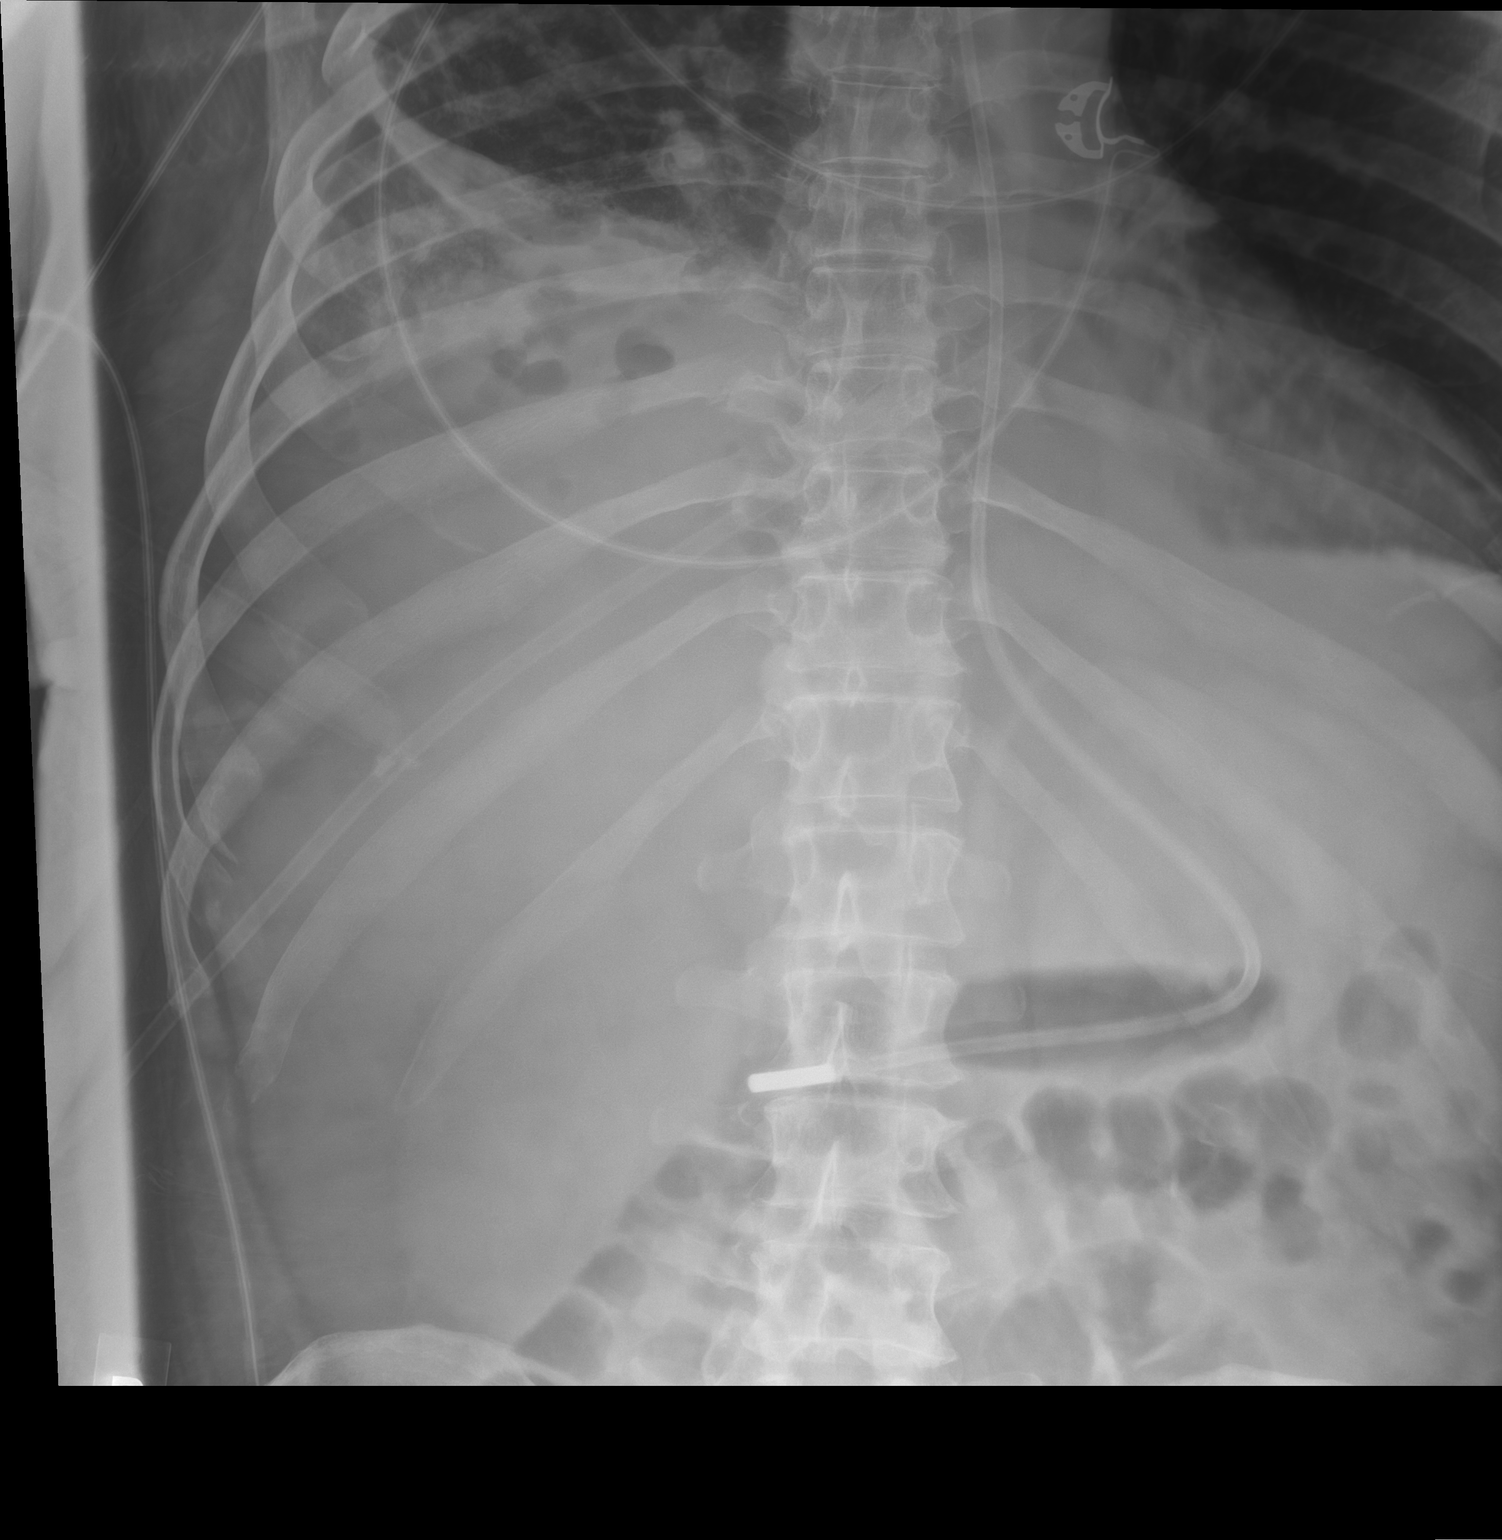

[1 of 1 positions shown; findings below may reference images not displayed]

FINDINGS: Weighted tip enteric tube tip terminates over the distal stomach to
proximal duodenum. Moderate RIGHT pleural effusion with
revisualization of RIGHT greater than LEFT basilar heterogeneous
opacities. Nonobstructive bowel gas pattern. Incomplete
visualization of the pelvis.
IMPRESSION: 1. Weighted tip enteric tube tip terminates over the distal stomach
to proximal duodenum.

## 2020-09-10 MED ORDER — MIDAZOLAM HCL (PF) 10 MG/2ML IJ SOLN: 4.0000 mg | INTRAMUSCULAR | Status: DC | PRN

## 2020-09-10 MED ORDER — HYDROMORPHONE BOLUS VIA INFUSION
5.0000 mg | Freq: Once | INTRAVENOUS | Status: AC
  Administered 2020-09-10: 5 mg via INTRAVENOUS
  Filled 2020-09-10: qty 5

## 2020-09-10 MED ORDER — KETAMINE BOLUS VIA INFUSION
0.3500 mg/kg | Freq: Once | INTRAVENOUS | Status: DC
  Filled 2020-09-10: qty 35

## 2020-09-10 MED ORDER — MIDAZOLAM HCL 2 MG/2ML IJ SOLN
4.0000 mg | Freq: Once | INTRAMUSCULAR | Status: AC
  Administered 2020-09-10: 4 mg via INTRAVENOUS
  Filled 2020-09-10 (×2): qty 4

## 2020-09-10 MED ORDER — MIDAZOLAM HCL 2 MG/2ML IJ SOLN
4.0000 mg | INTRAMUSCULAR | Status: DC | PRN
  Administered 2020-09-11 – 2020-09-14 (×9): 4 mg via INTRAVENOUS
  Filled 2020-09-10 (×11): qty 4

## 2020-09-10 MED ORDER — OXYCODONE HCL 5 MG PO TABS
10.0000 mg | ORAL_TABLET | Freq: Once | ORAL | Status: AC
  Administered 2020-09-10: 10 mg via ORAL
  Filled 2020-09-10: qty 2

## 2020-09-10 MED ORDER — HYDROMORPHONE BOLUS VIA INFUSION
2.0000 mg | INTRAVENOUS | Status: DC | PRN
Start: 2020-09-10 — End: 2020-09-17
  Administered 2020-09-10 – 2020-09-16 (×36): 2 mg via INTRAVENOUS
  Administered 2020-09-17: 0.5 mg via INTRAVENOUS
  Administered 2020-09-17: 2 mg via INTRAVENOUS
  Filled 2020-09-10: qty 2

## 2020-09-10 MED ORDER — OXYCODONE HCL 5 MG/5ML PO SOLN
10.0000 mg | Freq: Four times a day (QID) | ORAL | Status: DC
  Administered 2020-09-10 – 2020-09-11 (×4): 10 mg
  Filled 2020-09-10 (×4): qty 10

## 2020-09-10 MED ORDER — SODIUM CHLORIDE 0.9 % IV SOLN
0.5000 mg/kg/h | INTRAVENOUS | Status: DC
  Filled 2020-09-10: qty 5

## 2020-09-10 MED ORDER — MIDAZOLAM HCL (PF) 10 MG/2ML IJ SOLN: 4.0000 mg | Freq: Once | INTRAMUSCULAR | Status: DC

## 2020-09-10 NOTE — Progress Notes (Signed)
NAME:  Joseph Ayala, MRN:  [id number], DOB:  [birth year], LOS: 53 ADMISSION DATE:  08/27/2020, CONSULTATION DATE:  08/28/20 REFERRING MD:  Darliss Cheney, MD CHIEF COMPLAINT:  Acute hypoxemic respiratory failure  Brief History:  55 year old male on Suboxone for necrotizing myopathy admitted 2/19 with acute hypoxemic respiratory failure secondary to MRSA community-acquired pneumonia / aspiration. PCCM initially consulted for evaluation for bronchoscopy for possible mucous plugging. Transferred to ICU after worsening respiratory failure and mental status requiring intubation on 2/21. ICU course complicated by hypotension requiring pressors, chest tube placement for empyema s/p intrapleural lytics.   Past Medical History:  Hypertension, necrotizing myopathy, opioid use on Suboxone  Significant Hospital Events:  2/19 Arrived to ED for cough and chest pain x 1 week 2/20 Admitted to [facility] in early am. Intubated. Bronch 2/22 Worsening respiratory status and vasopressor requirement. Agitation on vent 2/23 Improving pressor requirement 2/24 Off pressors. Right chest tube placement. Propofol changed versed as TRG increased 2/25 About 650 out of CT since insertion. Stable vent requirements. Sedation and agitation a challenge / librium added. Fever. TPA into chest tube 2/26 FOB for mucus plugging 2/27 Briefly on neo overnight / thought sedation related  2/28 Repeat CT with little residual pleural fluid, largely consolidated lung. On vent. Oral oxy added.  3/01 Ceiling of IV gtt's reduced, weaning on PSV 10/5 - weaned for 8h. Diuresis.   3/02 Sedation changed, hypotension, levophed initiated. FOB with BAL, chest tube removed.   3/04 Discontinue aline, PICC placed LUE 3/05 Increase oral oxy, wean dilaudid  Consults:  TRH PCCM  Procedures:  ETT 2/21 >>  R Radial Aline 2/20 >> 3/4  L IJ TLC 2/21 >> 3/4 R CT 2/24 >> 3/2 LUE PICC 3/4 >>   Significant Diagnostic Tests:   CTA Chest 2/19 >> right sided  consolidation and ground glass opacities in the right and middle lobe. Background centrilobular emphysema present. No pulmonary embolism  CT CAP 2/24 >> Interval enlargement of right pleural effusion with some loculation. Progression of right airspace disease with cavitation in right middle/lower lobe. Anasarca  CT Chest w/o 2/28 >> no large amount of residual pleural fluid present with most of the low-density abnormality in the right mid to lower chest felt to represent necrotic and consolidated RLL, RML. There is still some fluid component in the posterior pleural space superior to the tip of the indwelling chest tube  Micro Data:  Blood culture 2/19 >> negative  BAL resp cx 2/20 >> MRSA BAL fungal cx 2/20 >> no fungal elements observed on stain >> BAL AFB cx 2/20 >> no AFB on smear >>  Urine strep ag 2/21 >> neg Urine legionella ag 2/21 >> neg Pleural fluid 2/24 >> ? If sent  BAL 2/26 >> MRSA  BAL 3/2 >> staph aureus >>   Antimicrobials:  Ceftriaxone 2/20 >> 2/21 Azithro 2/20 >> 2/21 Vanc 2/21 >> 3/4  Zosyn 2/21 >> 2/23 Linezolid 3/4 >>   Interim History / Subjective:  Fever curve improved on linezolid Dilaudid weaned overnight, RASS -1 to 1 intermittently Palliative care weighed in, rec'd ketamine and wean off opiates, Psych c/s recommended, contacting suboxone prescriber recommended  Objective   Blood pressure 128/68, pulse 76, temperature 98.96 F (37.2 C), resp. rate 17, height _0  (1.803 m), weight 99.4 kg, SpO2 96 %.    Vent Mode: PRVC FiO2 (%):  [40 %] 40 % Set Rate:  [16 bmp-24 bmp] 16 bmp Vt Set:  [600 mL] 600 mL PEEP:  [  5 cmH20] 5 cmH20 Pressure Support:  [8 cmH20] 8 cmH20 Plateau Pressure:  [22 cmH20-25 cmH20] 25 cmH20   Intake/Output Summary (Last 24 hours) at 09/10/2020 1115 Last data filed at 09/10/2020 0600 Gross per 24 hour  Intake 1812.77 ml  Output 2590 ml  Net -777.23 ml   Filed Weights   09/08/20 0500 09/09/20 0400 09/10/20 0500  Weight: 101.8  kg 101.9 kg 99.4 kg   Physical Exam: General: ill appearing adult male lying in bed in NAD on vent  HEENT: MM pink/moist, ETT, pupils 41m, anicteric  Neuro: sedate  CV: s1s2 RRR, SR 80's, no m/r/g PULM: non-labored on PRVC, bronchial breath sounds on right / coarse, clear on left  GI: soft, bsx4 active  Extremities: warm/dry, 1-2+ BLE pitting edema  Skin: non-blanching (improved) erythema on feet, multiple tattoos   Resolved Hospital Problem list   Elevated LFTs AKI Aspiration pneumonia/pneumonitis in setting of altered mental status Septic shock (off pressors as of 2/24) Cardiogenic shock   Assessment & Plan:   Acute hypoxemic respiratory failure secondary to MRSA PNA with Effusion   Parapneumonic Effusion versus Empyema  Mucus Plugging  S/p chest tube placement 2/24, tPA 2/25, 2/26.  Chest tube removed 3/2.  -PRVC 8cc/kg  -daily SBT / WUA  -change abx to linezolid 3/4 to finish course, fever curve appears imrpoved -chest PT Q4  -follow intermittent CXR -wife is going to think about concept of trach / timing etc.  We will follow up.   Septic +/- component Cardiogenic Shock secondary to MRSA PNA Additional sedation effect and diuresis  -continue levophed for MAP >65. Currently on 114m  -linezolid as above  -follow CVP for diuresis given ongoing third spacing in the setting of critical illness  -hold diuresis 3/5, net negative day prior   New onset heart failure with ejection fraction of 35 to 40% New onset atrial flutter/SVT secondary to sepsis Demand ischemia Thoracic Aneurysm - will need outpatient follow up  -continue amiodarone PT  -Cardiology signed off > would like to called back once more stable / able to titrate heart failure medications & to arrange follow up  -continue full dose lovenox  -tele monitoring  -levophed as above  -CVP Q shift   Acute Metabolic Encephalopathy Significant alcohol use and narcotic dependence Concern for withdrawal and  sepsis -hold home suboxone -increae oxycodone  -continue propofol + dilaudid gtt  -changed from versed in anticipation of weaning (quick on/off for WUA) -appreciate palliative care recommendations - recommended ketamine infusion and wean from other sedatives/analgesia and to stop PO oxy - appreciate recommendations, this provider has very little experience with ketamine so will continue prior plan of increasing oral opiates to rescue swings in agitation with dilaudid wean, reached out to PCP/suboxone prescriber about resuming suboxone in current state, consider Psych c/s per palliative rec -RASS goal -1 to -2   Hypokalemia  -KCL 3/4  -follow electrolytes, replace as indicated   Moderate Protein Calorie Malnutrition  -TF per Nutrition   -change to small bore gastric feeding tube to allow for ongoing medication dosage if extubated / remove oral gastric tube   Elevated LFT's -monitor intermittently, improved   Macrocytic Anemia  -trend CBC  -folate, MVI, thiamine   Best practice (evaluated daily)  Diet: TF Pain/Anxiety/Delirium protocol (if indicated): PAD goal -1 to -2 VAP protocol (if indicated): Yes DVT prophylaxis: Lovenox, treatment dose GI prophylaxis: Protonix Glucose control: CBG q4h Mobility: As tolerated Disposition: ICU  Goals of Care:  Last date of  multidisciplinary goals of care discussion:  Family and staff present:  Summary of discussion:  Follow up goals of care discussion due: 3/7 Code Status: Full code.    Wife, Ginger, updated 3/4 on plan of care (plan for the week is to update on arrival in person).    Critical Care Time:   CRITICAL CARE Performed by: Bonna Gains Hunsucker   Total critical care time: 45 minutes  Critical care time was exclusive of separately billable procedures and treating other patients.  Critical care was necessary to treat or prevent imminent or life-threatening deterioration.  Critical care was time spent personally by me on  the following activities: development of treatment plan with patient and/or surrogate as well as nursing, discussions with consultants, evaluation of patient's response to treatment, examination of patient, obtaining history from patient or surrogate, ordering and performing treatments and interventions, ordering and review of laboratory studies, ordering and review of radiographic studies, pulse oximetry and re-evaluation of patient's condition.

## 2020-09-10 NOTE — Progress Notes (Signed)
Difficult to sedate, agitated, attempting to self extubate in setting of ,max dose ordered sedation and restraints ; bonuses given without success : contacted Pola Corn MD

## 2020-09-10 NOTE — Progress Notes (Signed)
Post AM wake up assessment pt was profoundly agitated. Popping himself off the vent regularly, kicking his legs, jerking his arms to the point of developing friction injuries on his elbows, all Vitals elevated. All prn's given, gtts titrated up, environmental changes attempted. Dr. Judeth Horn was able to witness the agitation upon rounds. See mar for medication adjustments per Dr. Judeth Horn. Pt required attendance and did not settle until approx 1100.

## 2020-09-10 NOTE — Progress Notes (Signed)
eLink Physician-Brief Progress Note Patient Name: Joseph Ayala DOB: 09-Oct-1965 MRN: [id number]   Date of Service  09/10/2020  HPI/Events of Note  Patient has urinary retention, and has failed in / out catheter x 3.  eICU Interventions  Foley catheter ordered.        Thomasene Lot Ogan 09/10/2020, 2:17 AM

## 2020-09-10 NOTE — Progress Notes (Addendum)
eLink Physician-Brief Progress Note Patient Name: Joseph Ayala DOB: 14-Aug-1965 MRN: [id number]   Date of Service  09/10/2020  HPI/Events of Note  Extreme agitation. Patient sitting up in bed ready to pull out his ETT even with restraints on. Note reviewed from day MD. Has been very difficult to sedate. Multiple interventions have failed. At one point he was on versed, precedex in addition to current infusions.   eICU Interventions  Versed 4 mg x 1 Ketamine bolus and infusion ordered D/w RN who is well versed with using ketamine infusion and  d/w pharmacy as well , with titration parameters      Intervention Category Major Interventions: Delirium, psychosis, severe agitation - evaluation and management  Joseph Ayala Joseph Ayala 09/10/2020, 10:04 PM   10:20 pm RN called to let me know that the versed push quickly helped Placed an order for 4 mg q2h PRN agitation  If we need to use another dose of IV versed, we will begin the ketamine infusion If Ketamine also does not work, we will have no choice but to fall back on a versed drip  Discussed with RN and have placed those orders so they are available for RN

## 2020-09-11 DIAGNOSIS — J850 Gangrene and necrosis of lung: Secondary | ICD-10-CM | POA: Diagnosis not present

## 2020-09-11 LAB — BASIC METABOLIC PANEL
Anion gap: 5 (ref 5–15)
BUN: 15 mg/dL (ref 6–20)
CO2: 29 mmol/L (ref 22–32)
Calcium: 7.6 mg/dL — ABNORMAL LOW (ref 8.9–10.3)
Chloride: 104 mmol/L (ref 98–111)
Creatinine, Ser: 0.4 mg/dL — ABNORMAL LOW (ref 0.61–1.24)
GFR, Estimated: >60 mL/min (ref >60)
Glucose, Bld: 113 mg/dL — ABNORMAL HIGH (ref 70–99)
Potassium: 3.7 mmol/L (ref 3.5–5.1)
Sodium: 138 mmol/L (ref 135–145)

## 2020-09-11 LAB — GLUCOSE, CAPILLARY
Glucose-Capillary: 100 mg/dL — ABNORMAL HIGH (ref 70–99)
Glucose-Capillary: 102 mg/dL — ABNORMAL HIGH (ref 70–99)
Glucose-Capillary: 106 mg/dL — ABNORMAL HIGH (ref 70–99)
Glucose-Capillary: 149 mg/dL — ABNORMAL HIGH (ref 70–99)
Glucose-Capillary: 46 mg/dL — ABNORMAL LOW (ref 70–99)
Glucose-Capillary: 67 mg/dL — ABNORMAL LOW (ref 70–99)
Glucose-Capillary: 72 mg/dL (ref 70–99)
Glucose-Capillary: 79 mg/dL (ref 70–99)
Glucose-Capillary: 87 mg/dL (ref 70–99)
Glucose-Capillary: 94 mg/dL (ref 70–99)

## 2020-09-11 LAB — TRIGLYCERIDES: Triglycerides: 206 mg/dL — ABNORMAL HIGH (ref <150)

## 2020-09-11 LAB — CREATININE, SERUM
Creatinine, Ser: 0.4 mg/dL — ABNORMAL LOW (ref 0.61–1.24)
GFR, Estimated: >60 mL/min (ref >60)

## 2020-09-11 MED ORDER — DEXTROSE 50 % IV SOLN
INTRAVENOUS | Status: AC
  Filled 2020-09-11: qty 50

## 2020-09-11 MED ORDER — CLONAZEPAM 1 MG PO TABS
1.0000 mg | ORAL_TABLET | Freq: Two times a day (BID) | ORAL | Status: DC
  Administered 2020-09-11 – 2020-09-12 (×3): 1 mg
  Filled 2020-09-11 (×3): qty 1

## 2020-09-11 MED ORDER — QUETIAPINE FUMARATE 100 MG PO TABS
100.0000 mg | ORAL_TABLET | Freq: Two times a day (BID) | ORAL | Status: DC
  Administered 2020-09-11 – 2020-09-19 (×16): 100 mg
  Filled 2020-09-11 (×17): qty 1

## 2020-09-11 MED ORDER — HYDROMORPHONE HCL 2 MG PO TABS
4.0000 mg | ORAL_TABLET | ORAL | Status: DC
Start: 2020-09-11 — End: 2020-09-11

## 2020-09-11 MED ORDER — POTASSIUM CHLORIDE 20 MEQ PO PACK
20.0000 meq | PACK | Freq: Once | ORAL | Status: AC
  Administered 2020-09-11: 20 meq via ORAL
  Filled 2020-09-11: qty 1

## 2020-09-11 MED ORDER — POLYETHYLENE GLYCOL 3350 17 G PO PACK: 17.0000 g | PACK | Freq: Every day | ORAL | Status: DC | PRN

## 2020-09-11 MED ORDER — DEXTROSE 50 % IV SOLN
INTRAVENOUS | Status: AC
  Administered 2020-09-11: 25 g via INTRAVENOUS
  Filled 2020-09-11: qty 50

## 2020-09-11 MED ORDER — DEXTROSE 50 % IV SOLN: 25.0000 g | INTRAVENOUS | Status: AC

## 2020-09-11 MED ORDER — FUROSEMIDE 10 MG/ML IJ SOLN
40.0000 mg | Freq: Once | INTRAMUSCULAR | Status: AC
  Administered 2020-09-11: 40 mg via INTRAVENOUS
  Filled 2020-09-11: qty 4

## 2020-09-11 MED ORDER — CLONAZEPAM 1 MG PO TABS: 1.0000 mg | ORAL_TABLET | Freq: Two times a day (BID) | ORAL | Status: DC

## 2020-09-11 MED ORDER — HYDROMORPHONE BOLUS VIA INFUSION
4.0000 mg | INTRAVENOUS | Status: AC | PRN
  Administered 2020-09-11 (×2): 4 mg via INTRAVENOUS

## 2020-09-11 MED ORDER — HYDROMORPHONE HCL 2 MG PO TABS
4.0000 mg | ORAL_TABLET | ORAL | Status: DC
  Administered 2020-09-11 – 2020-09-12 (×5): 4 mg
  Filled 2020-09-11 (×5): qty 2

## 2020-09-11 MED ORDER — QUETIAPINE FUMARATE 100 MG PO TABS: 100.0000 mg | ORAL_TABLET | Freq: Two times a day (BID) | ORAL | Status: DC

## 2020-09-11 NOTE — Progress Notes (Signed)
Unsuccessful at keeping pt turned, he became extremely restless, throwing legs around and off bed, thrashing head back and forth, and severe dyssynchrony with vent. Temperature raised .5 degrees and oxygen saturation dropped; pt also diaphoretic. Able to get E-link RN and MD to camera in for video conference. Dr. Gillermina Phy gave additional prn sedation orders that helped patient settle back down. See MAR. Turned pressors back on to help boost BP that decreased after sedation given. Bathed patient after sedation goals met to ensure that wound care and proper positioning could be done while patient adequately sedated. Continuing to monitor through the night.

## 2020-09-11 NOTE — Progress Notes (Signed)
eLink Physician-Brief Progress Note Patient Name: Joseph Ayala DOB: 1966-01-16 MRN: [id number]   Date of Service  09/11/2020  HPI/Events of Note  Severe agitation despite propofol 60, dilaudid 6 mg/hr, and receiving versed 4mg  IV PRN. Precedex previously trialed and was ineffective. Patient on suboxone prior to admission.   eICU Interventions  Ordered 2x dilaudid 4mg  IV boluses from infusion pump. This resulted in significant improvement in the patient's agitation and ventilator dyssynchrony.  Will leave infusion rate unchanged at this time (6mg /hr) but if he requires additional boluses frequently we can consider an increase in infusion rate. He has a high narcotic tolerance.      Intervention Category Intermediate Interventions: Other:  09/11/2020, 9:07 PM

## 2020-09-11 NOTE — Progress Notes (Signed)
Contacted by patients wife : update to reflect recent changes in delirium and treatment : she reports feeling very frustrated with current treatment plan: encouraged to discuss with attending during family update : patient currently remains comfortable . On ordered sedation without ketamine order initiation

## 2020-09-11 NOTE — Progress Notes (Signed)
NAME:  Joseph Ayala, MRN:  [id number], DOB:  03-11-1966, LOS: 14 ADMISSION DATE:  08/27/2020, CONSULTATION DATE:  08/28/20 REFERRING MD:  Hughie Closs, MD CHIEF COMPLAINT:  Acute hypoxemic respiratory failure  Brief History:  55 year old male on Suboxone for necrotizing myopathy admitted 2/19 with acute hypoxemic respiratory failure secondary to MRSA community-acquired pneumonia / aspiration. PCCM initially consulted for evaluation for bronchoscopy for possible mucous plugging. Transferred to ICU after worsening respiratory failure and mental status requiring intubation on 2/21. ICU course complicated by hypotension requiring pressors, chest tube placement for empyema s/p intrapleural lytics.   Past Medical History:  Hypertension, necrotizing myopathy, opioid use on Suboxone  Significant Hospital Events:  2/19 Arrived to ED for cough and chest pain x 1 week 2/20 Admitted to [facility] in early am. Intubated. Bronch 2/22 Worsening respiratory status and vasopressor requirement. Agitation on vent 2/23 Improving pressor requirement 2/24 Off pressors. Right chest tube placement. Propofol changed versed as TRG increased 2/25 About 650 out of CT since insertion. Stable vent requirements. Sedation and agitation a challenge / librium added. Fever. TPA into chest tube 2/26 FOB for mucus plugging 2/27 Briefly on neo overnight / thought sedation related  2/28 Repeat CT with little residual pleural fluid, largely consolidated lung. On vent. Oral oxy added.  3/01 Ceiling of IV gtt's reduced, weaning on PSV 10/5 - weaned for 8h. Diuresis.   3/02 Sedation changed, hypotension, levophed initiated. FOB with BAL, chest tube removed.   3/04 Discontinue aline, PICC placed LUE 3/05 Increase oral oxy, wean dilaudid  Consults:  TRH PCCM  Procedures:  ETT 2/21 >>  R Radial Aline 2/20 >> 3/4  L IJ TLC 2/21 >> 3/4 R CT 2/24 >> 3/2 LUE PICC 3/4 >>   Significant Diagnostic Tests:   CTA Chest 2/19 >> right sided  consolidation and ground glass opacities in the right and middle lobe. Background centrilobular emphysema present. No pulmonary embolism  CT CAP 2/24 >> Interval enlargement of right pleural effusion with some loculation. Progression of right airspace disease with cavitation in right middle/lower lobe. Anasarca  CT Chest w/o 2/28 >> no large amount of residual pleural fluid present with most of the low-density abnormality in the right mid to lower chest felt to represent necrotic and consolidated RLL, RML. There is still some fluid component in the posterior pleural space superior to the tip of the indwelling chest tube  Micro Data:  Blood culture 2/19 >> negative  BAL resp cx 2/20 >> MRSA BAL fungal cx 2/20 >> no fungal elements observed on stain >> BAL AFB cx 2/20 >> no AFB on smear >>  Urine strep ag 2/21 >> neg Urine legionella ag 2/21 >> neg Pleural fluid 2/24 >> ? If sent  BAL 2/26 >> MRSA  BAL 3/2 >> staph aureus >>   Antimicrobials:  Ceftriaxone 2/20 >> 2/21 Azithro 2/20 >> 2/21 Vanc 2/21 >> 3/4  Zosyn 2/21 >> 2/23 Linezolid 3/4 >>   Interim History / Subjective:  Fever curve improved on linezolid Challenging sedation Objective   Blood pressure (!) 92/59, pulse 64, temperature 98.24 F (36.8 C), resp. rate 16, height 5\' 11"  (1.803 m), weight 98.6 kg, SpO2 98 %. CVP:  [23 mmHg] 23 mmHg  Vent Mode: PRVC FiO2 (%):  [40 %] 40 % Set Rate:  [16 bmp] 16 bmp Vt Set:  [600 mL] 600 mL PEEP:  [5 cmH20] 5 cmH20 Plateau Pressure:  [17 cmH20-23 cmH20] 19 cmH20   Intake/Output Summary (Last 24 hours)  at 09/11/2020 0954 Last data filed at 09/11/2020 0700 Gross per 24 hour  Intake 2951.48 ml  Output 2000 ml  Net 951.48 ml   Filed Weights   09/09/20 0400 09/10/20 0500 09/11/20 0425  Weight: 101.9 kg 99.4 kg 98.6 kg   Physical Exam: General: ill appearing adult male lying in bed in NAD on vent  HEENT: MM pink/moist, ETT, pupils 3mm, anicteric  Neuro: sedate  CV: s1s2 RRR, SR  80's, no m/r/g PULM: non-labored on PRVC, bronchial breath sounds on right / coarse, clear on left  GI: soft, bsx4 active  Extremities: warm/dry, 1-2+ BLE pitting edema  Skin: non-blanching (improved) erythema on feet, multiple tattoos   Resolved Hospital Problem list   Elevated LFTs AKI Aspiration pneumonia/pneumonitis in setting of altered mental status Septic shock (off pressors as of 2/24) Cardiogenic shock   Assessment & Plan:   Acute hypoxemic respiratory failure secondary to MRSA PNA with Effusion   Parapneumonic Effusion versus Empyema  Mucus Plugging  S/p chest tube placement 2/24, tPA 2/25, 2/26.  Chest tube removed 3/2.  -PRVC 8cc/kg  -daily SBT / WUA  -change abx to linezolid 3/4 to finish course, fever curve appears imrpoved -chest PT Q4  -follow intermittent CXR -Recommend trach to help with wean  Septic +/- component Cardiogenic Shock secondary to MRSA PNA Additional sedation effect and diuresis  -continue levophed for MAP >65. Currently on  -linezolid as above  -follow CVP for diuresis given ongoing third spacing in the setting of critical illness  -Diurese 3/6, CVP elevated  New onset heart failure with ejection fraction of 35 to 40% New onset atrial flutter/SVT secondary to sepsis Demand ischemia Thoracic Aneurysm - will need outpatient follow up  -continue amiodarone PO  -Cardiology signed off > would like to called back once more stable / able to titrate heart failure medications & to arrange follow up  -continue full dose lovenox  -tele monitoring  -levophed as above  -CVP Q shift   Acute Metabolic Encephalopathy Significant alcohol use and narcotic dependence Concern for withdrawal and sepsis -hold home suboxone -Dilaudid PO 4mg  q4, increase as needed to reduce drip clonazepam 1 mg BID --Seroquel qoo mg BID, check EKG -continue propofol + dilaudid gtt  -appreciate palliative care recommendations - recommended ketamine infusion and wean  from other sedatives/analgesia and to stop PO oxy - appreciate recommendations, this provider has very little experience with ketamine so will continue prior plan of increasing oral opiates to rescue swings in agitation with dilaudid wean, reached out to PCP/suboxone prescriber about resuming suboxone in current state, consider Psych c/s per palliative rec -RASS goal -1 to -2   Hypokalemia  -KCL 3/6 -follow electrolytes, replace as indicated   Moderate Protein Calorie Malnutrition  -TF per Nutrition    Elevated LFT's -monitor intermittently, improved   Macrocytic Anemia  -trend CBC  -folate, MVI, thiamine   Best practice (evaluated daily)  Diet: TF Pain/Anxiety/Delirium protocol (if indicated): PAD goal -1 to -2 VAP protocol (if indicated): Yes DVT prophylaxis: Lovenox, treatment dose GI prophylaxis: Protonix Glucose control: CBG q4h Mobility: As tolerated Disposition: ICU  Goals of Care:  Last date of multidisciplinary goals of care discussion:  Family and staff present:  Summary of discussion:  Follow up goals of care discussion due: 3/7 Code Status: Full code.    Wife, Ginger, updated 3/5 on plan of care, likely plan for trach early in week but no final decision as of yet  Critical Care Time:  CRITICAL CARE Performed by: Lesia Sago Breeann Reposa   Total critical care time: 35 minutes  Critical care time was exclusive of separately billable procedures and treating other patients.  Critical care was necessary to treat or prevent imminent or life-threatening deterioration.  Critical care was time spent personally by me on the following activities: development of treatment plan with patient and/or surrogate as well as nursing, discussions with consultants, evaluation of patient's response to treatment, examination of patient, obtaining history from patient or surrogate, ordering and performing treatments and interventions, ordering and review of laboratory studies, ordering  and review of radiographic studies, pulse oximetry and re-evaluation of patient's condition.

## 2020-09-12 DIAGNOSIS — J9601 Acute respiratory failure with hypoxia: Secondary | ICD-10-CM

## 2020-09-12 DIAGNOSIS — J850 Gangrene and necrosis of lung: Secondary | ICD-10-CM | POA: Diagnosis not present

## 2020-09-12 LAB — BASIC METABOLIC PANEL
Anion gap: 4 — ABNORMAL LOW (ref 5–15)
BUN: 15 mg/dL (ref 6–20)
CO2: 32 mmol/L (ref 22–32)
Calcium: 8.2 mg/dL — ABNORMAL LOW (ref 8.9–10.3)
Chloride: 104 mmol/L (ref 98–111)
Creatinine, Ser: 0.49 mg/dL — ABNORMAL LOW (ref 0.61–1.24)
GFR, Estimated: >60 mL/min (ref >60)
Glucose, Bld: 103 mg/dL — ABNORMAL HIGH (ref 70–99)
Potassium: 3.7 mmol/L (ref 3.5–5.1)
Sodium: 140 mmol/L (ref 135–145)

## 2020-09-12 LAB — CBC
HCT: 29.3 % — ABNORMAL LOW (ref 39.0–52.0)
Hemoglobin: 8.8 g/dL — ABNORMAL LOW (ref 13.0–17.0)
MCH: 30.2 pg (ref 26.0–34.0)
MCHC: 30 g/dL (ref 30.0–36.0)
MCV: 100.7 fL — ABNORMAL HIGH (ref 80.0–100.0)
Platelets: 435 10*3/uL — ABNORMAL HIGH (ref 150–400)
RBC: 2.91 MIL/uL — ABNORMAL LOW (ref 4.22–5.81)
RDW: 15 % (ref 11.5–15.5)
WBC: 15 10*3/uL — ABNORMAL HIGH (ref 4.0–10.5)
nRBC: 0 % (ref 0.0–0.2)

## 2020-09-12 LAB — GLUCOSE, CAPILLARY
Glucose-Capillary: 102 mg/dL — ABNORMAL HIGH (ref 70–99)
Glucose-Capillary: 69 mg/dL — ABNORMAL LOW (ref 70–99)
Glucose-Capillary: 87 mg/dL (ref 70–99)
Glucose-Capillary: 105 mg/dL — ABNORMAL HIGH (ref 70–99)
Glucose-Capillary: 94 mg/dL (ref 70–99)
Glucose-Capillary: 79 mg/dL (ref 70–99)

## 2020-09-12 MED ORDER — HALOPERIDOL LACTATE 5 MG/ML IJ SOLN
5.0000 mg | INTRAMUSCULAR | Status: AC
  Administered 2020-09-12: 5 mg via INTRAVENOUS
  Filled 2020-09-12: qty 1

## 2020-09-12 MED ORDER — DEXTROSE 50 % IV SOLN
INTRAVENOUS | Status: AC
  Administered 2020-09-12: 12.5 g via INTRAVENOUS
  Filled 2020-09-12: qty 50

## 2020-09-12 MED ORDER — CLONAZEPAM 0.5 MG PO TBDP: 1.0000 mg | ORAL_TABLET | Freq: Once | ORAL | Status: DC

## 2020-09-12 MED ORDER — KETAMINE BOLUS VIA INFUSION
0.1000 mg/kg | Freq: Once | INTRAVENOUS | Status: AC
  Administered 2020-09-12: 8.52 mg via INTRAVENOUS
  Filled 2020-09-12: qty 10

## 2020-09-12 MED ORDER — LINEZOLID 600 MG PO TABS
600.0000 mg | ORAL_TABLET | Freq: Two times a day (BID) | ORAL | Status: DC
  Administered 2020-09-12 – 2020-09-13 (×2): 600 mg
  Filled 2020-09-12 (×2): qty 1

## 2020-09-12 MED ORDER — SODIUM CHLORIDE 0.9 % IV SOLN
0.5000 mg/kg/h | INTRAVENOUS | Status: DC
  Administered 2020-09-12: 0.5 mg/kg/h via INTRAVENOUS
  Filled 2020-09-12 (×2): qty 5

## 2020-09-12 MED ORDER — POTASSIUM CHLORIDE 20 MEQ PO PACK
40.0000 meq | PACK | Freq: Every day | ORAL | Status: DC
  Administered 2020-09-13 – 2020-09-25 (×13): 40 meq
  Filled 2020-09-12 (×13): qty 2

## 2020-09-12 MED ORDER — METHADONE HCL 5 MG PO TABS
5.0000 mg | ORAL_TABLET | Freq: Three times a day (TID) | ORAL | Status: DC
  Administered 2020-09-12 (×2): 5 mg
  Filled 2020-09-12 (×2): qty 1

## 2020-09-12 MED ORDER — LINEZOLID 100 MG/5ML PO SUSR
600.0000 mg | Freq: Two times a day (BID) | ORAL | Status: DC
  Filled 2020-09-12: qty 30

## 2020-09-12 MED ORDER — FUROSEMIDE 10 MG/ML IJ SOLN
40.0000 mg | Freq: Once | INTRAMUSCULAR | Status: DC
  Filled 2020-09-12: qty 4

## 2020-09-12 MED ORDER — NYSTATIN 100000 UNIT/GM EX POWD
Freq: Two times a day (BID) | CUTANEOUS | Status: DC
  Administered 2020-09-16 – 2020-09-27 (×3): 1 via TOPICAL
  Filled 2020-09-12 (×2): qty 15

## 2020-09-12 MED ORDER — CHLORHEXIDINE GLUCONATE 0.12 % MT SOLN
OROMUCOSAL | Status: AC
  Administered 2020-09-12: 15 mL via OROMUCOSAL
  Filled 2020-09-12: qty 15

## 2020-09-12 MED ORDER — CLONAZEPAM 0.5 MG PO TBDP
1.0000 mg | ORAL_TABLET | Freq: Once | ORAL | Status: AC
  Administered 2020-09-12: 1 mg
  Filled 2020-09-12: qty 2

## 2020-09-12 MED ORDER — DEXTROSE 50 % IV SOLN: 12.5000 g | INTRAVENOUS | Status: AC

## 2020-09-12 MED ORDER — MIDAZOLAM 50MG/50ML (1MG/ML) PREMIX INFUSION
0.5000 mg/h | INTRAVENOUS | Status: DC
  Administered 2020-09-12: 0.5 mg/h via INTRAVENOUS
  Filled 2020-09-12: qty 50

## 2020-09-12 MED ORDER — CLONAZEPAM 1 MG PO TABS
2.0000 mg | ORAL_TABLET | Freq: Two times a day (BID) | ORAL | Status: DC
  Administered 2020-09-12 – 2020-09-14 (×5): 2 mg
  Filled 2020-09-12 (×5): qty 2

## 2020-09-12 MED ORDER — POTASSIUM CHLORIDE 20 MEQ PO PACK
40.0000 meq | PACK | Freq: Once | ORAL | Status: AC
  Administered 2020-09-12: 40 meq
  Filled 2020-09-12: qty 2

## 2020-09-12 MED ORDER — DEXTROSE 10 % IV SOLN: INTRAVENOUS | Status: DC

## 2020-09-12 MED ORDER — MIDAZOLAM HCL 2 MG/2ML IJ SOLN
1.0000 mg | INTRAMUSCULAR | Status: DC | PRN
  Administered 2020-09-13 – 2020-09-14 (×2): 4 mg via INTRAVENOUS
  Administered 2020-09-14: 2 mg via INTRAVENOUS
  Administered 2020-09-15: 4 mg via INTRAVENOUS
  Administered 2020-09-16: 2 mg via INTRAVENOUS
  Administered 2020-09-16: 4 mg via INTRAVENOUS
  Administered 2020-09-19: 2 mg via INTRAVENOUS
  Administered 2020-09-19: 4 mg via INTRAVENOUS
  Filled 2020-09-12: qty 4
  Filled 2020-09-12: qty 2
  Filled 2020-09-12: qty 4
  Filled 2020-09-12: qty 2
  Filled 2020-09-12 (×3): qty 4
  Filled 2020-09-12: qty 2

## 2020-09-12 NOTE — Progress Notes (Signed)
Pt agitated, RN working on sedation.  CPT held at this time.

## 2020-09-12 NOTE — Progress Notes (Signed)
APP exam -> patient followed commands to app  Plan ' - will dc eeg - will dc ct head     SIGNATURE    Dr. Kalman Shan, M.D., F.C.C.P,  Pulmonary and Critical Care Medicine Staff Physician, [facility] Director - Interstitial Lung Disease  Program  Pulmonary Fibrosis [facility] at [facility] [place], [place], [address]  Pager: [phone], If no answer  OR between  19:00-7:00h: page [phone] Telephone (clinical office): [phone] Telephone (research): [phone]  3:51 PM 09/12/2020

## 2020-09-12 NOTE — Progress Notes (Signed)
RT changed the Pt's ET Tube holder and his ET tube is at 25cn. Pt tolerated well

## 2020-09-12 NOTE — TOC Progression Note (Signed)
Transition of Care [facility]) - Progression Note    Patient Details  Name: Joseph Ayala MRN: [id number] Date of Birth: 24-Jan-1966  Transition of Care [facility]) CM/SW Contact  Leeroy Cha, RN Phone Number: 09/12/2020, 9:12 AM  Clinical Narrative:    [place] [facility] Events:  2/19 Arrived to ED for cough and chest pain x 1 week 2/20 Admitted to [facility] in early am. Intubated. Bronch 2/22 Worsening respiratory status and vasopressor requirement. Agitation on vent 2/23 Improving pressor requirement 2/24 Off pressors. Right chest tube placement. Propofol changed versed as TRG increased 2/25 About 650 out of CT since insertion. Stable vent requirements. Sedation and agitation a challenge / librium added. Fever. TPA into chest tube 2/26 FOB for mucus plugging 2/27 Briefly on neo overnight / thought sedation related  2/28 Repeat CT with little residual pleural fluid, largely consolidated lung. On vent. Oral oxy added.  3/01 Ceiling of IV gtt's reduced, weaning on PSV 10/5 - weaned for 8h. Diuresis.   3/02 Sedation changed, hypotension, levophed initiated. FOB with BAL, chest tube removed.   3/04 Discontinue aline, PICC placed LUE 3/05 Increase oral oxy, wean dilaudid PLAN: following for toc needs may need ltach admission once stabilized on the vent.   Expected Discharge Plan: Home/Self Care Barriers to Discharge: Continued Medical Work up  Expected Discharge Plan and Services Expected Discharge Plan: Home/Self Care   Discharge Planning Services: CM Consult   Living arrangements for the past 2 months: Single Family Home                                       Social Determinants of Health (SDOH) Interventions    Readmission Risk Interventions No flowsheet data found.

## 2020-09-12 NOTE — Consult Note (Signed)
Reason for Consult: Respiratory failure Referring Physician: Critical care  Joseph Ayala is an 55 y.o. male.  HPI: History of necrotizing MRSA pneumonia.  He has been on the ventilator without evidence that he is going to build to come off it anytime soon.  He is intubated and now needs a tracheotomy.  The wife is now ready to proceed with that.  Past Medical History:  Diagnosis Date  . Degenerative disc disease, lumbar   . Dental crown present   . ETOH abuse   . History of kidney stones   . Hypertension    states under control with meds., has been on med. x 1 yr. - is currently out of med., to see PCP 02/06/2018  . Muscle atrophy 01/2018  . Muscle weakness 01/2018  . Necrotizing myopathy   . Opioid use disorder   . Thoracic aortic aneurysm (TAA) (HCC)    a. seen on CT 08/2020.    Past Surgical History:  Procedure Laterality Date  . ENDOVENOUS ABLATION SAPHENOUS VEIN W/ LASER Right 12-29-2015   endovenous laser ablation right greater saphenous vein, stab phlebectomy > 20 incisions right leg, sclerotherapy right leg by Gretta Began MD    . MINOR MUSCLE BIOPSY Right 02/11/2018   Procedure: RECTUS FEMORIS MUSCLE BIOPSY;  Surgeon: Darnell Level, MD;  Location: [facility];  Service: General;  Laterality: Right;  . MUSCLE BIOPSY Right 02/11/2018   Procedure: DELTOID MUSCLE BIOPSY;  Surgeon: Darnell Level, MD;  Location: [facility];  Service: General;  Laterality: Right;  . NASAL FRACTURE SURGERY      Family History  Problem Relation Age of Onset  . Varicose Veins Brother     Social History:  reports that he has never smoked. He has quit using smokeless tobacco. He reports current alcohol use. He reports that he does not use drugs.  Allergies:  Allergies  Allergen Reactions  . Vicodin [Hydrocodone-Acetaminophen] Nausea And Vomiting    Medications: I have reviewed the patient's current medications.  Results for orders placed or performed during the  hospital encounter of 08/27/20 (from the past 48 hour(s))  Glucose, capillary     Status: None   Collection Time: 09/10/20  3:53 PM  Result Value Ref Range   Glucose-Capillary 87 70 - 99 mg/dL    Comment: Glucose reference range applies only to samples taken after fasting for at least 8 hours.  Glucose, capillary     Status: None   Collection Time: 09/10/20  7:58 PM  Result Value Ref Range   Glucose-Capillary 74 70 - 99 mg/dL    Comment: Glucose reference range applies only to samples taken after fasting for at least 8 hours.   Comment 1 Notify RN    Comment 2 Document in Chart   Glucose, capillary     Status: Abnormal   Collection Time: 09/10/20 11:42 PM  Result Value Ref Range   Glucose-Capillary 65 (L) 70 - 99 mg/dL    Comment: Glucose reference range applies only to samples taken after fasting for at least 8 hours.   Comment 1 Notify RN    Comment 2 Document in Chart   Glucose, capillary     Status: None   Collection Time: 09/11/20 12:14 AM  Result Value Ref Range   Glucose-Capillary 72 70 - 99 mg/dL    Comment: Glucose reference range applies only to samples taken after fasting for at least 8 hours.   Comment 1 Notify RN    Comment  2 Document in Chart   Glucose, capillary     Status: None   Collection Time: 09/11/20  3:24 AM  Result Value Ref Range   Glucose-Capillary 79 70 - 99 mg/dL    Comment: Glucose reference range applies only to samples taken after fasting for at least 8 hours.   Comment 1 Notify RN    Comment 2 Document in Chart   Triglycerides     Status: Abnormal   Collection Time: 09/11/20  4:18 AM  Result Value Ref Range   Triglycerides 206 (H) <150 mg/dL    Comment: Performed at [facility], [address] [place], [place] [address]  Creatinine, serum     Status: Abnormal   Collection Time: 09/11/20  4:18 AM  Result Value Ref Range   Creatinine, Ser 0.40 (L) 0.61 - 1.24 mg/dL   GFR, Estimated >09 >32 mL/min    Comment:  (NOTE) Calculated using the CKD-EPI Creatinine Equation (2021) Performed at [facility], [address] [place], [place] [address]   Glucose, capillary     Status: None   Collection Time: 09/11/20  8:06 AM  Result Value Ref Range   Glucose-Capillary 94 70 - 99 mg/dL    Comment: Glucose reference range applies only to samples taken after fasting for at least 8 hours.   Comment 1 Notify RN    Comment 2 Document in Chart   Basic metabolic panel     Status: Abnormal   Collection Time: 09/11/20  8:20 AM  Result Value Ref Range   Sodium 138 135 - 145 mmol/L   Potassium 3.7 3.5 - 5.1 mmol/L   Chloride 104 98 - 111 mmol/L   CO2 29 22 - 32 mmol/L   Glucose, Bld 113 (H) 70 - 99 mg/dL    Comment: Glucose reference range applies only to samples taken after fasting for at least 8 hours.   BUN 15 6 - 20 mg/dL   Creatinine, Ser 5.80 (L) 0.61 - 1.24 mg/dL   Calcium 7.6 (L) 8.9 - 10.3 mg/dL   GFR, Estimated >99 >83 mL/min    Comment: (NOTE) Calculated using the CKD-EPI Creatinine Equation (2021)    Anion gap 5 5 - 15    Comment: Performed at [facility], [address] [place], [place] [address]  Glucose, capillary     Status: Abnormal   Collection Time: 09/11/20 12:05 PM  Result Value Ref Range   Glucose-Capillary 67 (L) 70 - 99 mg/dL    Comment: Glucose reference range applies only to samples taken after fasting for at least 8 hours.   Comment 1 Notify RN    Comment 2 Document in Chart    Comment 3 Repeat Test   Glucose, capillary     Status: Abnormal   Collection Time: 09/11/20 12:18 PM  Result Value Ref Range   Glucose-Capillary 100 (H) 70 - 99 mg/dL    Comment: Glucose reference range applies only to samples taken after fasting for at least 8 hours.   Comment 1 Notify RN    Comment 2 Document in Chart   Glucose, capillary     Status: None   Collection Time: 09/11/20  6:49 PM  Result Value Ref Range   Glucose-Capillary 87 70 - 99 mg/dL     Comment: Glucose reference range applies only to samples taken after fasting for at least 8 hours.   Comment 1 Notify RN    Comment 2 Document in Chart  Glucose, capillary     Status: Abnormal   Collection Time: 09/11/20  7:35 PM  Result Value Ref Range   Glucose-Capillary 46 (L) 70 - 99 mg/dL    Comment: Glucose reference range applies only to samples taken after fasting for at least 8 hours.  Glucose, capillary     Status: Abnormal   Collection Time: 09/11/20  8:08 PM  Result Value Ref Range   Glucose-Capillary 149 (H) 70 - 99 mg/dL    Comment: Glucose reference range applies only to samples taken after fasting for at least 8 hours.  Glucose, capillary     Status: Abnormal   Collection Time: 09/11/20  8:57 PM  Result Value Ref Range   Glucose-Capillary 106 (H) 70 - 99 mg/dL    Comment: Glucose reference range applies only to samples taken after fasting for at least 8 hours.   Comment 1 Notify RN    Comment 2 Document in Chart   Glucose, capillary     Status: Abnormal   Collection Time: 09/11/20 11:39 PM  Result Value Ref Range   Glucose-Capillary 102 (H) 70 - 99 mg/dL    Comment: Glucose reference range applies only to samples taken after fasting for at least 8 hours.   Comment 1 Notify RN    Comment 2 Document in Chart   CBC     Status: Abnormal   Collection Time: 09/12/20  3:30 AM  Result Value Ref Range   WBC 15.0 (H) 4.0 - 10.5 K/uL   RBC 2.91 (L) 4.22 - 5.81 MIL/uL   Hemoglobin 8.8 (L) 13.0 - 17.0 g/dL   HCT 08.6 (L) 57.8 - 46.9 %   MCV 100.7 (H) 80.0 - 100.0 fL   MCH 30.2 26.0 - 34.0 pg   MCHC 30.0 30.0 - 36.0 g/dL   RDW 62.9 52.8 - 41.3 %   Platelets 435 (H) 150 - 400 K/uL   nRBC 0.0 0.0 - 0.2 %    Comment: Performed at [facility], [address], [place], [place] [address]  Glucose, capillary     Status: Abnormal   Collection Time: 09/12/20  3:34 AM  Result Value Ref Range   Glucose-Capillary 69 (L) 70 - 99 mg/dL    Comment: Glucose  reference range applies only to samples taken after fasting for at least 8 hours.   Comment 1 Notify RN    Comment 2 Document in Chart   Basic metabolic panel     Status: Abnormal   Collection Time: 09/12/20  3:39 AM  Result Value Ref Range   Sodium 140 135 - 145 mmol/L   Potassium 3.7 3.5 - 5.1 mmol/L   Chloride 104 98 - 111 mmol/L   CO2 32 22 - 32 mmol/L   Glucose, Bld 103 (H) 70 - 99 mg/dL    Comment: Glucose reference range applies only to samples taken after fasting for at least 8 hours.   BUN 15 6 - 20 mg/dL   Creatinine, Ser 0.27 (L) 0.61 - 1.24 mg/dL   Calcium 8.2 (L) 8.9 - 10.3 mg/dL   GFR, Estimated >25 >36 mL/min    Comment: (NOTE) Calculated using the CKD-EPI Creatinine Equation (2021)    Anion gap 4 (L) 5 - 15    Comment: Performed at [facility], [address] [place], [place] [address]  Glucose, capillary     Status: Abnormal   Collection Time: 09/12/20  4:14 AM  Result Value Ref Range   Glucose-Capillary  102 (H) 70 - 99 mg/dL    Comment: Glucose reference range applies only to samples taken after fasting for at least 8 hours.  Glucose, capillary     Status: None   Collection Time: 09/12/20  7:53 AM  Result Value Ref Range   Glucose-Capillary 87 70 - 99 mg/dL    Comment: Glucose reference range applies only to samples taken after fasting for at least 8 hours.   Comment 1 Notify RN    Comment 2 Document in Chart   Glucose, capillary     Status: Abnormal   Collection Time: 09/12/20 11:33 AM  Result Value Ref Range   Glucose-Capillary 105 (H) 70 - 99 mg/dL    Comment: Glucose reference range applies only to samples taken after fasting for at least 8 hours.   Comment 1 Notify RN    Comment 2 Document in Chart     No results found.  ROS Blood pressure 118/71, pulse 100, temperature (!) 100.94 F (38.3 C), resp. rate (!) 21, height 5\' 11"  (1.803 m), weight 100.1 kg, SpO2 100 %. Physical Exam Constitutional:      Comments: Patient not  responsive for questioning.  HENT:     Head:     Comments: Patient is intubated.  Palpation of his neck is really not reveal any masses or lesions.  The thyroid cartilage is palpable low in the neck.  Endotracheal tube is in place without evidence of issues or erosion.      Assessment/Plan: Respiratory failure-he definitely has significant pulmonary and respiratory issues that indicate a prolonged respiratory compromise and need for intubation.  A tracheotomy certainly is appropriate.  I discussed this with the wife and she is ready to proceed.  We discussed risk, benefits, and options.  All her questions were answered and consent was obtained.  [facility].The surgery [facility] Wednesday [facility] 10:[facility].  Suzanna ObeyJohn Blanche Scovell 09/12/2020, 2:52 PM

## 2020-09-12 NOTE — Progress Notes (Addendum)
NAME:  Joseph Ayala Warning, MRN:  [id number], DOB:  1966/06/28, LOS: 24 ADMISSION DATE:  08/27/2020, CONSULTATION DATE:  08/28/20 REFERRING MD:  Darliss Cheney, MD CHIEF COMPLAINT:  Acute hypoxemic respiratory failure  Brief History:  55 year old male on Suboxone for necrotizing myopathy admitted 2/19 with acute hypoxemic respiratory failure secondary to MRSA community-acquired pneumonia / aspiration. PCCM initially consulted for evaluation for bronchoscopy for possible mucous plugging. Transferred to ICU after worsening respiratory failure and mental status requiring intubation on 2/21. ICU course complicated by hypotension requiring pressors, chest tube placement for empyema s/p intrapleural lytics.   Past Medical History:  Hypertension, necrotizing myopathy, opioid use on Suboxone  Significant Hospital Events:  2/19 Arrived to ED for cough and chest pain x 1 week 2/20 Admitted to [facility] in early am. Intubated. Bronch 2/22 Worsening respiratory status and vasopressor requirement. Agitation on vent 2/23 Improving pressor requirement 2/24 Off pressors. Right chest tube placement. Propofol changed versed as TRG increased 2/25 About 650 out of CT since insertion. Stable vent requirements. Sedation and agitation a challenge / librium added. Fever. TPA into chest tube 2/26 FOB for mucus plugging 2/27 Briefly on neo overnight / thought sedation related  2/28 Repeat CT with little residual pleural fluid, largely consolidated lung. On vent. Oral oxy added.  3/01 Ceiling of IV gtt's reduced, weaning on PSV 10/5 - weaned for 8h. Diuresis.   3/02 Sedation changed, hypotension, levophed initiated. FOB with BAL, chest tube removed.   3/04 Discontinue aline, PICC placed LUE 3/05 Increase oral oxy, wean dilaudid 3/06 Improved fever curve on linezolid, challenging sedation  Consults:  TRH PCCM  Procedures:  ETT 2/21 >>  R Radial Aline 2/20 >> 3/4  L IJ TLC 2/21 >> 3/4 R CT 2/24 >> 3/2 LUE PICC 3/4 >>    Significant Diagnostic Tests:   CTA Chest 2/19 >> right sided consolidation and ground glass opacities in the right and middle lobe. Background centrilobular emphysema present. No pulmonary embolism  CT CAP 2/24 >> Interval enlargement of right pleural effusion with some loculation. Progression of right airspace disease with cavitation in right middle/lower lobe. Anasarca  CT Chest w/o 2/28 >> no large amount of residual pleural fluid present with most of the low-density abnormality in the right mid to lower chest felt to represent necrotic and consolidated RLL, RML. There is still some fluid component in the posterior pleural space superior to the tip of the indwelling chest tube  Micro Data:  Blood culture 2/19 >> negative  BAL resp cx 2/20 >> MRSA BAL fungal cx 2/20 >> no fungal elements observed on stain >> BAL AFB cx 2/20 >> no AFB on smear >>  Urine strep ag 2/21 >> neg Urine legionella ag 2/21 >> neg Pleural fluid 2/24 >> ? If sent  BAL 2/26 >> MRSA  BAL 3/2 >> few MRSA    Antimicrobials:  Ceftriaxone 2/20 >> 2/21 Azithro 2/20 >> 2/21 Vanc 2/21 >> 3/4  Zosyn 2/21 >> 2/23 Linezolid 3/4 >>   Interim History / Subjective:  Tmax 100.9 / WBC 15 Vent - 40%, PEEP 5  Glucose range 69-103 I/O 2.9L UOP, 359m stool, +251 ml in last 24 hours  Not on pressors RN reports significant agitation this am, pt attempting to get out of bed / appears uncomfortable   Objective   Blood pressure 129/76, pulse 99, temperature (!) 100.94 F (38.3 C), resp. rate 20, height _0  (1.803 m), weight 100.1 kg, SpO2 99 %. CVP:  [23 mmHg]  23 mmHg  Vent Mode: PRVC FiO2 (%):  [40 %] 40 % Set Rate:  [16 bmp] 16 bmp Vt Set:  [600 mL] 600 mL PEEP:  [5 cmH20] 5 cmH20 Plateau Pressure:  [17 cmH20-21 cmH20] 21 cmH20   Intake/Output Summary (Last 24 hours) at 09/12/2020 8546 Last data filed at 09/12/2020 0700 Gross per 24 hour  Intake 3500.61 ml  Output 3300 ml  Net 200.61 ml   Filed Weights    09/11/20 0425 09/12/20 0000 09/12/20 0334  Weight: 98.6 kg 100.1 kg 100.1 kg   Physical Exam: General: adult male, ill appearing, agitated in bed   HEENT: MM pink/moist, sweating, anicteric  Neuro: opens eyes to voice, makes eye contact, able to follow commands, MAE  CV: s1s2 RRR, tachy, no m/r/g PULM: labored at rest, agitated, lungs bilaterally with coarse rhonchi, copious secretions from ETT GI: soft, bsx4 active  Extremities: warm/dry, 1-2+ BLE edema, dependent edema in hands  Skin: non-blanching erythematous rash on feet, improved   Resolved Hospital Problem list   Elevated LFTs - likely combination of hx and congestive hepatopathy AKI Aspiration pneumonia/pneumonitis in setting of altered mental status Septic shock (off pressors as of 2/24) Cardiogenic shock   Assessment & Plan:   Acute hypoxemic respiratory failure secondary to MRSA PNA with Effusion   Parapneumonic Effusion versus Empyema  Mucus Plugging  S/p chest tube placement 2/24, tPA 2/25, 2/26.  Chest tube removed 3/2.  -PRVC 8cc/kg  -daily SBT / WUA  -continue linezolid  -follow intermittent CXR  -ENT consulted 3/7 for trach, will have to coordinate transport to [facility] for OR time -continue chest PT  Septic +/- component Cardiogenic Shock secondary to MRSA PNA Additional sedation effect and diuresis  -linezolid as above  -follow CVP, diuresis based on CVP rather than peripheral edema due to third spacing  -lasix 40 mg IV x1 if CVP >10, RN to confirm   New onset heart failure with ejection fraction of 35 to 40% New onset atrial flutter/SVT secondary to sepsis Demand ischemia Thoracic Aneurysm - will need outpatient follow up  Cardiology signed off > would like call back once more stable to titrate HF medications / follow up  -amiodarone PT  -full dose lovenox > will need to hold for trach (await timing) -tele monitoring  -CVP Q Shift  -assess EKG for QTc with seroquel, amiodarone   Acute Metabolic  Encephalopathy Significant alcohol use and narcotic dependence Concern for withdrawal and sepsis -hold home suboxone  -discontinue dilaudid  -reviewed with Dr. Chase Caller, add methadone 62m TID  -increase klonopin to 255mBID  -seroquel 10026mID PT  -appreciate Palliative Care input > ketamine added 3/7, wean propofol to off.  -goal to reduce dilaudid IV as well once off propofol  -RASS Goal -1 to -2  Hypokalemia  -KCL 40 mEq x1  -follow electrolytes, replace as indicated  Moderate Protein Calorie Malnutrition  -TF per Nutrition   Macrocytic Anemia  -trend CBC  -folate, thiamine, MVI   Best practice (evaluated daily)  Diet: TF Pain/Anxiety/Delirium protocol (if indicated): PAD goal -1 to -2 VAP protocol (if indicated): Yes DVT prophylaxis: Lovenox, treatment dose GI prophylaxis: Protonix Glucose control: CBG q4h Mobility: As tolerated Disposition: ICU  Goals of Care:  Last date of multidisciplinary goals of care discussion:  Family and staff present:  Summary of discussion:  Follow up goals of care discussion due: 3/14 Code Status: Full code.    Wife, Ginger, updated 3/7 on plan of care.  Ok [place]  trach when ENT has availability.    Critical Care Time: 41 minutes   Noe Gens, MSN, APRN, NP-C, AGACNP-BC [facility] 09/12/2020, 8:28 AM   Please see [email or web address] for pager details.   From 7A-7P if no response, please call [phone] After hours, please call ELink [phone]

## 2020-09-12 NOTE — Progress Notes (Addendum)
   Agitated again   Plan  - haldol 5mg  IV x 1 (QTC normal in AM) - versed infusion to start - rASS goal 0 to -2    Allergies  Allergen Reactions  . Vicodin [Hydrocodone-Acetaminophen] Nausea And Vomiting        SIGNATURE    Dr. , M.D., F.C.C.P,  Pulmonary and Critical Care Medicine Staff Physician, [facility] Director - Interstitial Lung Disease  Program  Pulmonary Fibrosis [facility] at [facility] [place], [place], [place]  Pager: [phone], If no answer  OR between  19:00-7:00h: page [phone] Telephone (clinical office): [phone] Telephone (research): [phone]  5:44 PM 09/12/2020

## 2020-09-12 NOTE — Progress Notes (Signed)
[facility] called to arrange transport for trach Wednesday am (1055 am).  [facility] will call back with definitive pick up time on Tuesday evening.     Plan: -NPO after MN on 3/9  -D10 at 20 ml/hr once TF off (low normal sugars) while NPO  -family consented per Dr. Jearld Fenton.   Will follow closely.   Canary Brim, MSN, APRN, NP-C, AGACNP-BC [facility] 09/12/2020, 3:21 PM   Please see [email or web address] for pager details.   From 7A-7P if no response, please call [phone] After hours, please call ELink [phone]

## 2020-09-12 NOTE — Plan of Care (Signed)
Ongoing issues with agitation that prevent reaching goals for weaning from sedation and ventilator. Likewise, respiratory therapist has suctioned large amounts of pink-tinged mucus from ETT this shift. Patient remains in restraints due to agitation and high risk for self-extubation.    Problem: Education: Goal: Knowledge of General Education information will improve Description: Including pain rating scale, medication(s)/side effects and non-pharmacologic comfort measures 09/12/2020 0243 by Lamona Curl, RN Outcome: Progressing 09/12/2020 0238 by Lamona Curl, RN Outcome: Progressing   Problem: Clinical Measurements: Goal: Will remain free from infection 09/12/2020 0243 by Lamona Curl, RN Outcome: Progressing 09/12/2020 0238 by Lamona Curl, RN Outcome: Progressing Goal: Diagnostic test results will improve 09/12/2020 0243 by Lamona Curl, RN Outcome: Progressing 09/12/2020 0238 by Lamona Curl, RN Outcome: Progressing Goal: Respiratory complications will improve 09/12/2020 0243 by Lamona Curl, RN Outcome: Progressing 09/12/2020 0238 by Lamona Curl, RN Outcome: Progressing   Problem: Safety: Goal: Non-violent Restraint(s) 09/12/2020 0243 by Lamona Curl, RN Outcome: Progressing 09/12/2020 0238 by Lamona Curl, RN Outcome: Progressing   Problem: Pain Managment: Goal: General experience of comfort will improve 09/12/2020 0243 by Lamona Curl, RN Outcome: Not Progressing 09/12/2020 0238 by Lamona Curl, RN Outcome: Progressing   Problem: Activity: Goal: Ability to tolerate increased activity will improve 09/12/2020 0243 by Lamona Curl, RN Outcome: Not Progressing 09/12/2020 0238 by Lamona Curl, RN Outcome: Progressing   Problem: Respiratory: Goal: Ability to maintain a clear airway and adequate ventilation will improve 09/12/2020 0243 by Lamona Curl, RN Outcome: Not Progressing 09/12/2020 0238 by Lamona Curl, RN Outcome:  Progressing   Problem: Role Relationship: Goal: Method of communication will improve 09/12/2020 0243 by Lamona Curl, RN Outcome: Not Progressing 09/12/2020 0238 by Lamona Curl, RN Outcome: Progressing

## 2020-09-12 NOTE — Progress Notes (Signed)
Patient is sedated with Propofol and Dilaudid. Doctor told RN it's ok to do EEG. Heading to [employer] to do hook up.

## 2020-09-12 NOTE — Progress Notes (Signed)
CPT not completed at this time- RN working on medication for agitation.

## 2020-09-13 ENCOUNTER — Inpatient Hospital Stay (HOSPITAL_COMMUNITY): Payer: Medicare HMO

## 2020-09-13 DIAGNOSIS — J9601 Acute respiratory failure with hypoxia: Secondary | ICD-10-CM | POA: Diagnosis not present

## 2020-09-13 DIAGNOSIS — R509 Fever, unspecified: Secondary | ICD-10-CM | POA: Diagnosis not present

## 2020-09-13 DIAGNOSIS — B9562 Methicillin resistant Staphylococcus aureus infection as the cause of diseases classified elsewhere: Secondary | ICD-10-CM

## 2020-09-13 DIAGNOSIS — J850 Gangrene and necrosis of lung: Secondary | ICD-10-CM | POA: Diagnosis not present

## 2020-09-13 DIAGNOSIS — J9811 Atelectasis: Secondary | ICD-10-CM | POA: Diagnosis not present

## 2020-09-13 LAB — BASIC METABOLIC PANEL
Anion gap: 8 (ref 5–15)
BUN: 16 mg/dL (ref 6–20)
CO2: 29 mmol/L (ref 22–32)
Calcium: 8.4 mg/dL — ABNORMAL LOW (ref 8.9–10.3)
Chloride: 106 mmol/L (ref 98–111)
Creatinine, Ser: 0.45 mg/dL — ABNORMAL LOW (ref 0.61–1.24)
GFR, Estimated: >60 mL/min (ref >60)
Glucose, Bld: 111 mg/dL — ABNORMAL HIGH (ref 70–99)
Potassium: 3.9 mmol/L (ref 3.5–5.1)
Sodium: 143 mmol/L (ref 135–145)

## 2020-09-13 LAB — GLUCOSE, CAPILLARY
Glucose-Capillary: 104 mg/dL — ABNORMAL HIGH (ref 70–99)
Glucose-Capillary: 64 mg/dL — ABNORMAL LOW (ref 70–99)
Glucose-Capillary: 76 mg/dL (ref 70–99)
Glucose-Capillary: 80 mg/dL (ref 70–99)
Glucose-Capillary: 85 mg/dL (ref 70–99)
Glucose-Capillary: 91 mg/dL (ref 70–99)

## 2020-09-13 LAB — CORTISOL: Cortisol, Plasma: 15.8 ug/dL

## 2020-09-13 LAB — PHOSPHORUS: Phosphorus: 3.3 mg/dL (ref 2.5–4.6)

## 2020-09-13 LAB — MAGNESIUM: Magnesium: 2 mg/dL (ref 1.7–2.4)

## 2020-09-13 LAB — LACTIC ACID, PLASMA: Lactic Acid, Venous: 1 mmol/L (ref 0.5–1.9)

## 2020-09-13 IMAGING — DX DG CHEST 1V PORT
1 series · 1 of 1 positions shown · non-contrast
Comparison: [DATE].

CLINICAL DATA: Acute respiratory failure with hypoxia.

EXAM:
PORTABLE CHEST 1 VIEW

[chest ap]
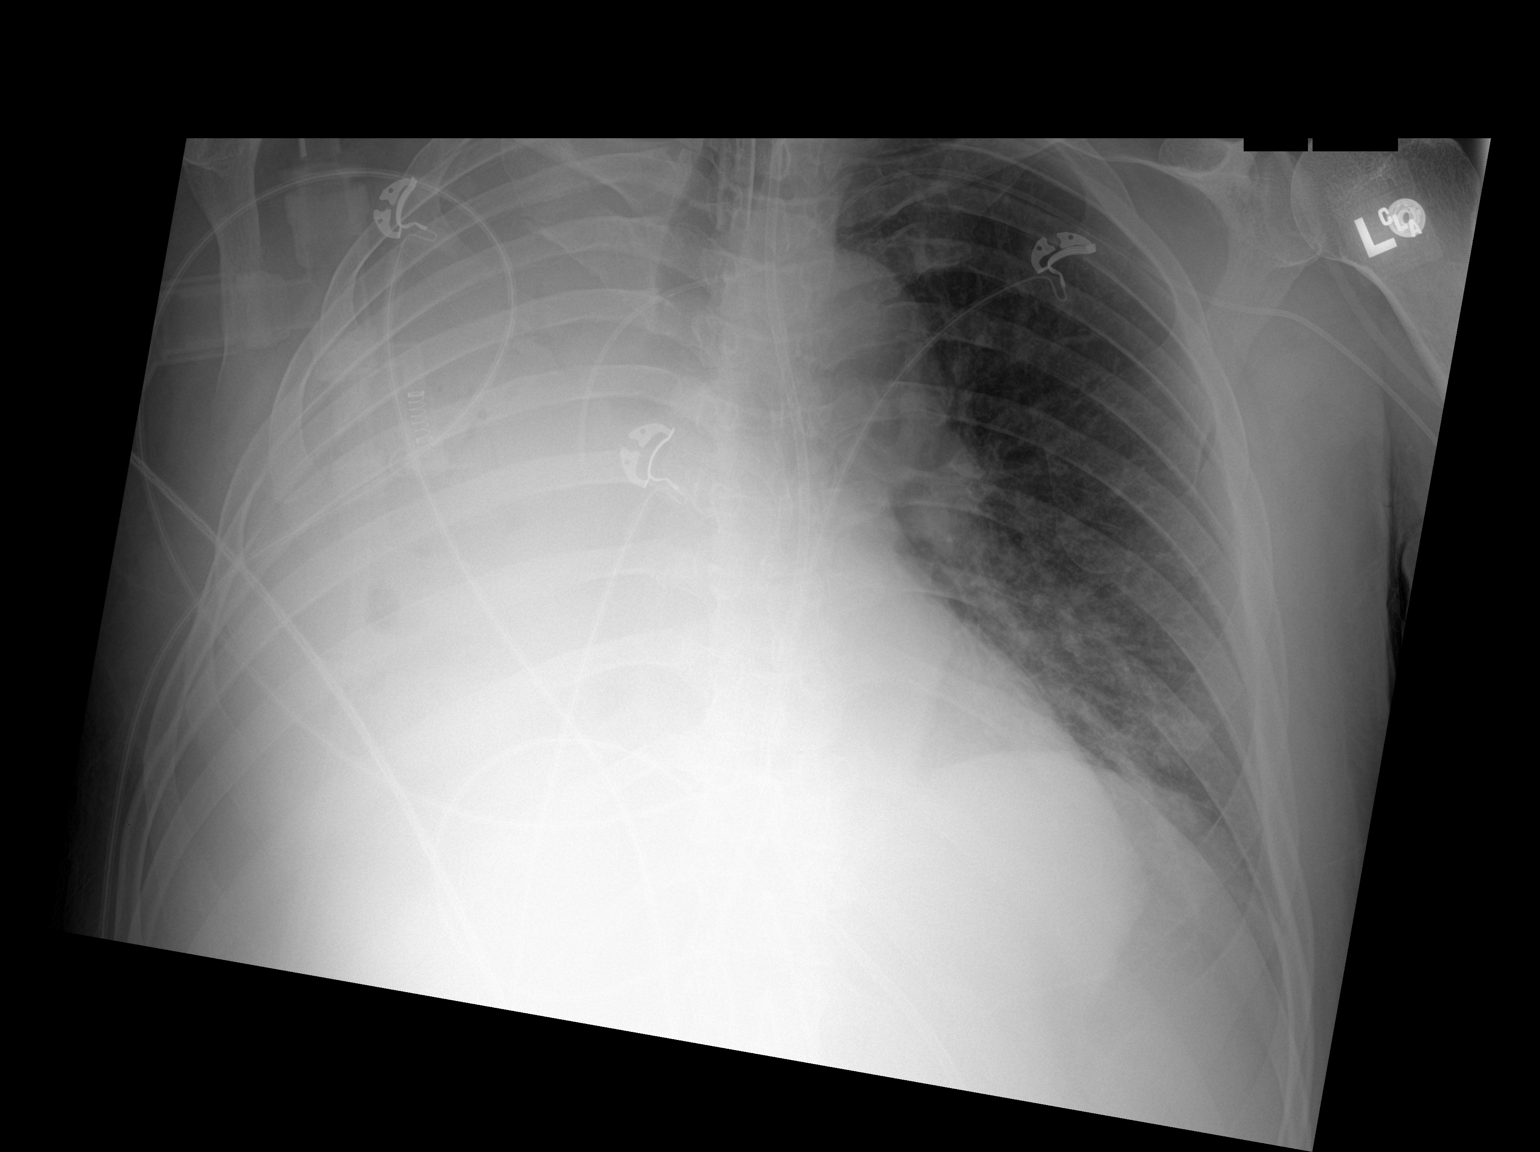

[1 of 1 positions shown; findings below may reference images not displayed]

FINDINGS: Endotracheal tube, feeding tube, left PICC line stable position.
Complete consolidation with volume loss in the right lung noted.
Occluded right mainstem bronchus most likely present. Mucous
plugging could present in this fashion. Associated right pleural
effusion cannot be excluded. Mild left base infiltrate. No
pneumothorax.
IMPRESSION: 1. Complete consolidation with volume loss in the right lung.
Occluded right mainstem bronchus most likely present. Mucous
plugging could present in this fashion. Associated right pleural
effusion cannot be excluded.
2. Mild left base infiltrate.
3. Lines and tubes in stable position.

These results will be called to the ordering clinician or
representative by the Radiologist Assistant, and communication
documented in the PACS or [REDACTED].

## 2020-09-13 IMAGING — CT CT CHEST W/O CM
2 of 4 series · 15 of 36 positions shown, 18 images · non-contrast
Comparison: [DATE], [DATE]

CLINICAL DATA: MRSA, empyema, acute respiratory failure, hypoxia

EXAM:
CT CHEST WITHOUT CONTRAST
TECHNIQUE: Multidetector CT imaging of the chest was performed following the
standard protocol without IV contrast.

[Series 2: thorax · axial · 0.72mm/px · z∈[-273,+7]mm · 12 of 166 slices shown, 15 images]
[im 13/166  mediastinal]
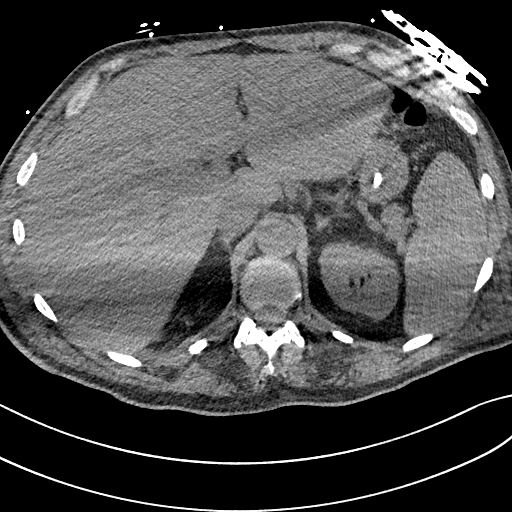
[im 13/166  lung]
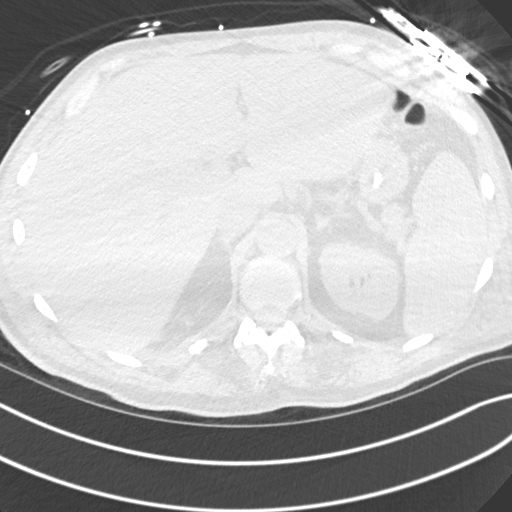
[im 26/166  lung]
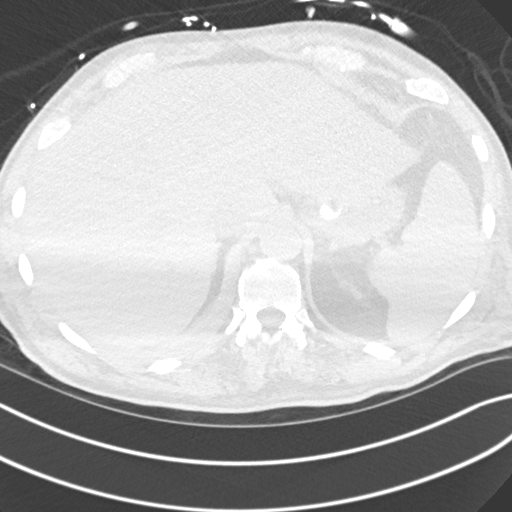
[im 39/166  lung]
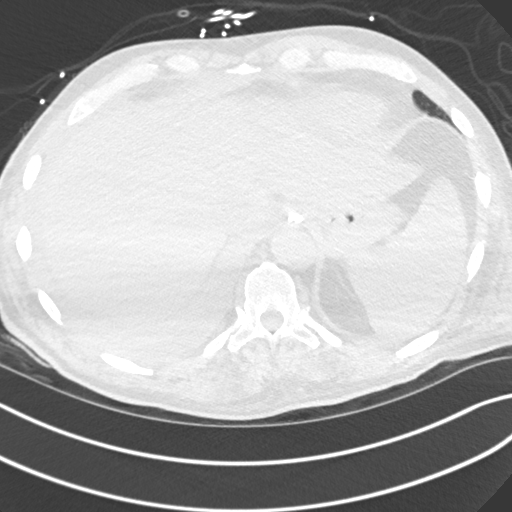
[im 51/166  lung]
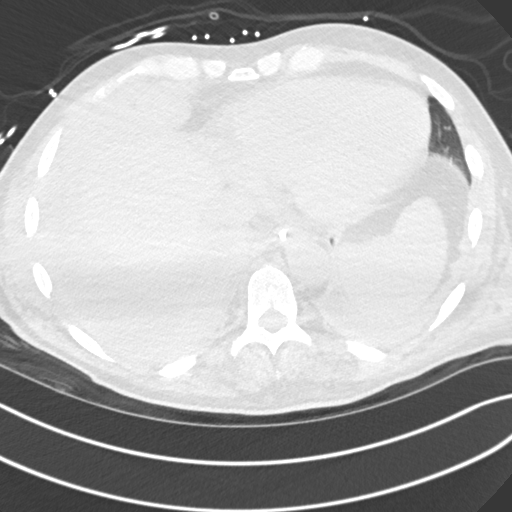
[im 64/166  mediastinal]
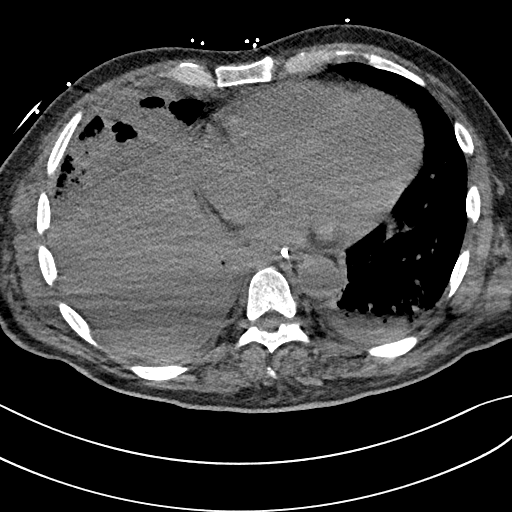
[im 64/166  lung]
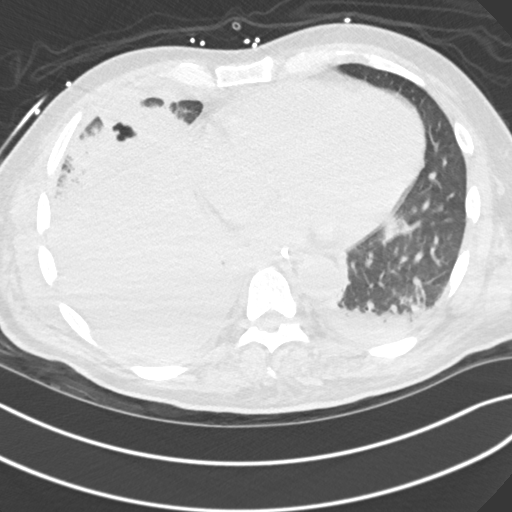
[im 77/166  lung]
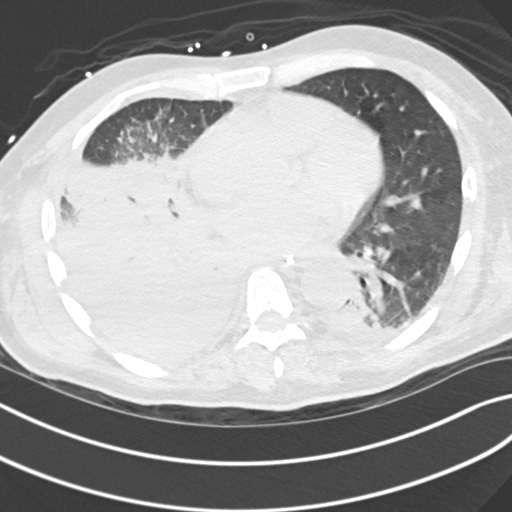
[im 89/166  lung]
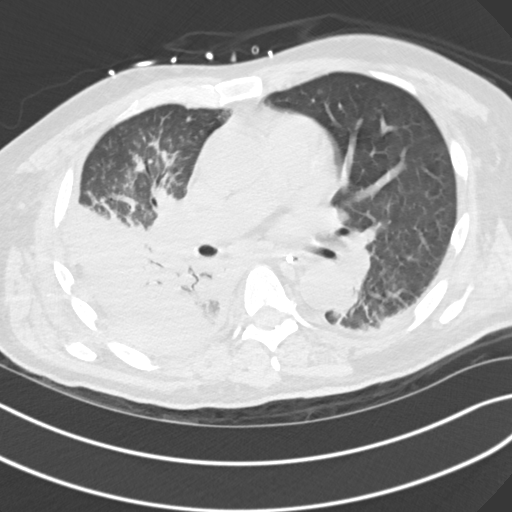
[im 102/166  lung]
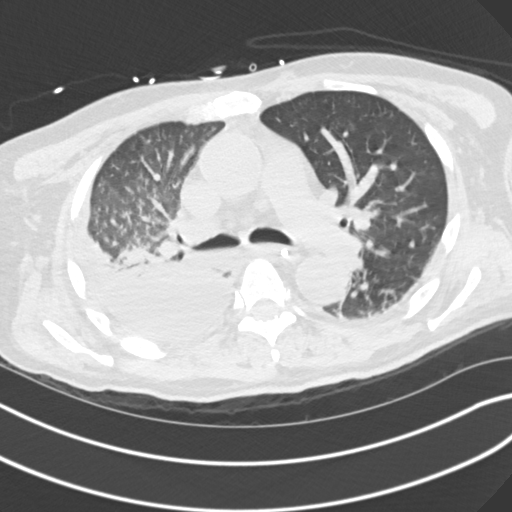
[im 115/166  mediastinal]
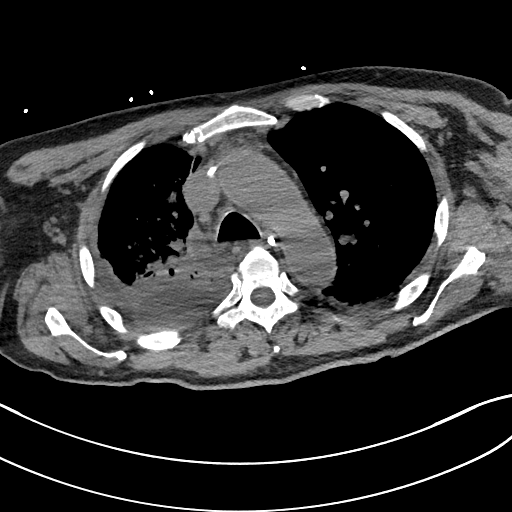
[im 115/166  lung]
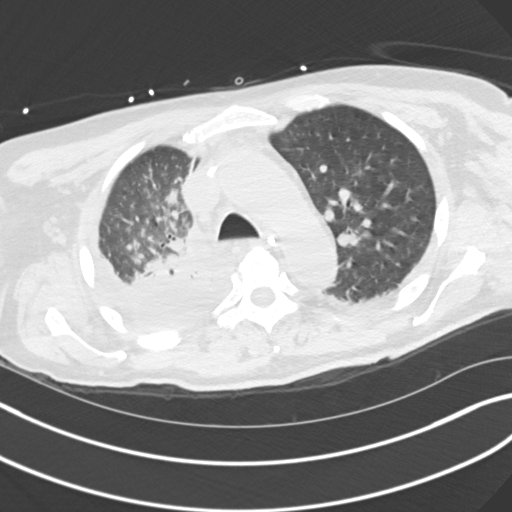
[im 127/166  lung]
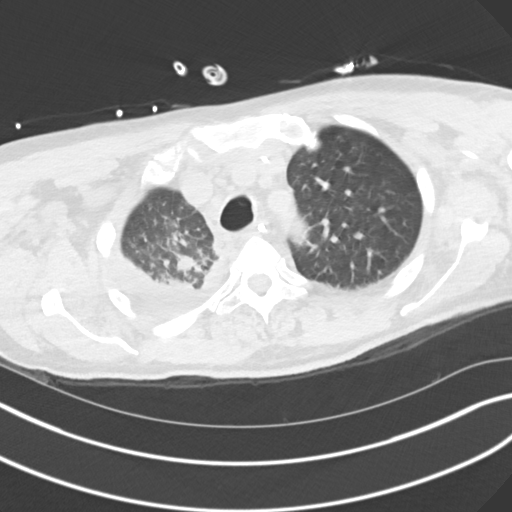
[im 140/166  lung]
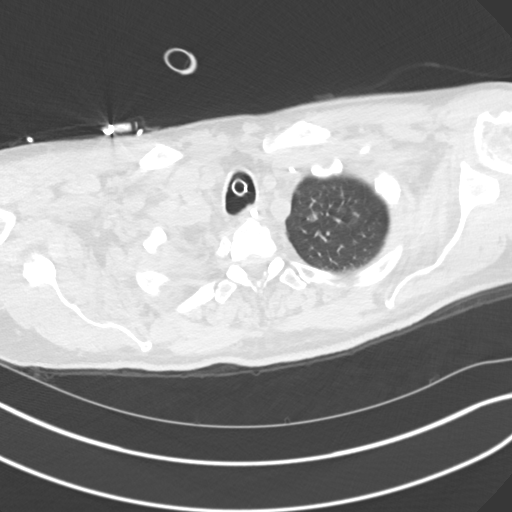
[im 153/166  lung]
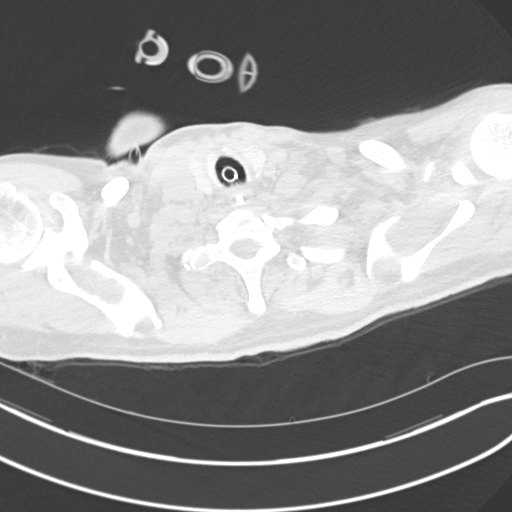

[Series 5: coronal · coronal · 0.67mm/px · 3 of 151 slices shown]
[im 31/151  lung]
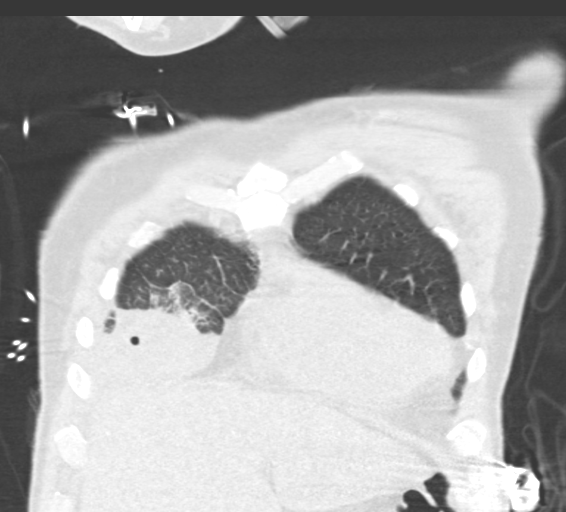
[im 61/151  lung]
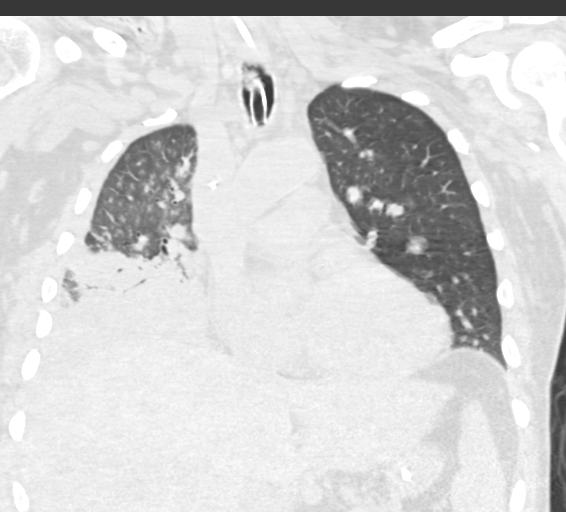
[im 91/151  lung]
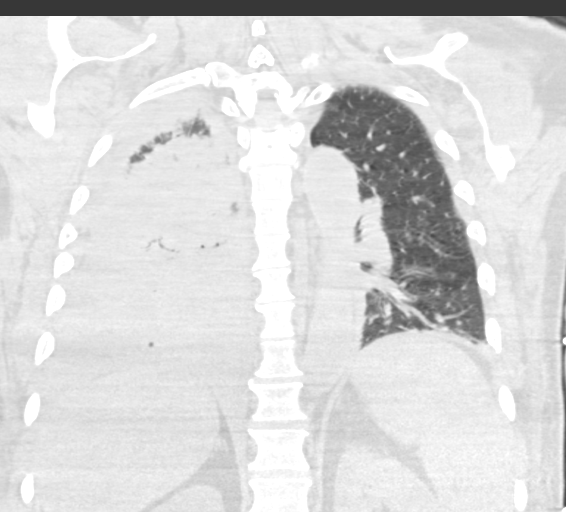

[15 of 36 positions shown; findings below may reference images not displayed]

FINDINGS: Cardiovascular: Limited unenhanced imaging of the heart and great
vessels demonstrates mild cardiomegaly without pericardial effusion.
Normal caliber of the thoracic aorta.

There is a left-sided PICC, tip within the azygos vein.

Mediastinum/Nodes: Endotracheal tube tip well above carina. Enteric
catheter extends into the gastric lumen. Subcentimeter mediastinal
lymph nodes are stable and reactive. No pathologic adenopathy.

Lungs/Pleura: The large bore right-sided chest tube seen previously
has been removed in the interim. Once again, is difficult to
separate out consolidated lung from the complex right pleural
effusion. Overall, no significant change in the volume of pleural
fluid. Decreased gas in the pleural space since prior study.
Persistent right middle and right lower lobe consolidation, with
scattered areas of cavitation identified. In addition, there is
progressive airspace disease throughout the aerated right upper
lobe.

Dependent consolidation within the left lower lobe with trace left
pleural effusion unchanged since prior study.

No evidence of pneumothorax.

Upper Abdomen: No acute abnormality.

Musculoskeletal: No acute or destructive bony lesions. Reconstructed
images demonstrate no additional findings.
IMPRESSION: 1. Left-sided PICC line, tip now within the azygos vein. Please
correlate with catheter function.
2. Persistent complex right pleural effusion consistent with
empyema.
3. Persistent areas of right middle and right lower lobe airspace
disease, with decreasing cavitation and increasing consolidation
since prior study. Increasing airspace disease throughout the
aerated right upper lobe. Findings are consistent with progressive
pneumonia.
4. Stable left lower lobe consolidation and trace left pleural
effusion, favor atelectasis.

These results will be called to the ordering clinician or
representative by the Radiologist Assistant, and communication
documented in the PACS or [REDACTED].

## 2020-09-13 MED ORDER — NOREPINEPHRINE 4 MG/250ML-% IV SOLN: 0.0000 ug/min | INTRAVENOUS | Status: DC

## 2020-09-13 MED ORDER — LIP MEDEX EX OINT
TOPICAL_OINTMENT | CUTANEOUS | Status: DC | PRN
  Administered 2020-09-16 – 2020-09-20 (×2): 1 via TOPICAL
  Filled 2020-09-13: qty 7

## 2020-09-13 MED ORDER — NOREPINEPHRINE 16 MG/250ML-% IV SOLN
0.0000 ug/min | INTRAVENOUS | Status: DC
  Administered 2020-09-13: 2 ug/min via INTRAVENOUS
  Filled 2020-09-13: qty 250

## 2020-09-13 MED ORDER — POTASSIUM CHLORIDE 20 MEQ PO PACK
20.0000 meq | PACK | ORAL | Status: AC
  Administered 2020-09-13: 20 meq
  Filled 2020-09-13: qty 1

## 2020-09-13 MED ORDER — ENOXAPARIN SODIUM 100 MG/ML ~~LOC~~ SOLN
100.0000 mg | Freq: Two times a day (BID) | SUBCUTANEOUS | Status: DC
  Administered 2020-09-14 – 2020-09-20 (×13): 100 mg via SUBCUTANEOUS
  Filled 2020-09-13 (×14): qty 1

## 2020-09-13 MED ORDER — METHADONE HCL 10 MG PO TABS: 10.0000 mg | ORAL_TABLET | Freq: Three times a day (TID) | ORAL | Status: DC

## 2020-09-13 MED ORDER — PHENYLEPHRINE 40 MCG/ML (10ML) SYRINGE FOR IV PUSH (FOR BLOOD PRESSURE SUPPORT)
PREFILLED_SYRINGE | INTRAVENOUS | Status: AC
  Administered 2020-09-13: 80 ug
  Filled 2020-09-13: qty 10

## 2020-09-13 MED ORDER — FUROSEMIDE 10 MG/ML IJ SOLN
40.0000 mg | Freq: Once | INTRAMUSCULAR | Status: AC
  Administered 2020-09-13: 40 mg via INTRAVENOUS
  Filled 2020-09-13: qty 4

## 2020-09-13 MED ORDER — KETAMINE BOLUS VIA INFUSION
1.5000 mg/kg | Freq: Once | INTRAVENOUS | Status: DC
  Filled 2020-09-13: qty 115

## 2020-09-13 MED ORDER — ACETYLCYSTEINE 20 % IN SOLN
4.0000 mL | Freq: Two times a day (BID) | RESPIRATORY_TRACT | Status: DC
  Administered 2020-09-13 – 2020-09-16 (×7): 4 mL via RESPIRATORY_TRACT
  Administered 2020-09-17: 1 mL via RESPIRATORY_TRACT
  Filled 2020-09-13 (×9): qty 4

## 2020-09-13 MED ORDER — SODIUM CHLORIDE 0.9 % IV SOLN
600.0000 mg | Freq: Three times a day (TID) | INTRAVENOUS | Status: DC
  Administered 2020-09-13 – 2020-09-28 (×45): 600 mg via INTRAVENOUS
  Filled 2020-09-13 (×49): qty 600

## 2020-09-13 MED ORDER — SODIUM CHLORIDE 0.9 % IV SOLN
1.0000 mg/kg/h | INTRAVENOUS | Status: DC
  Administered 2020-09-13 – 2020-09-15 (×8): 1 mg/kg/h via INTRAVENOUS
  Filled 2020-09-13 (×13): qty 5

## 2020-09-13 NOTE — Progress Notes (Signed)
RN discussed with Phillips Odor MD (Palliative Care) and Ramaswamy (CCM)- RN unable to administer Ketamine bolus via pump. RN to maintain continuous rate 42mL/hr at this time and keep patient comfortable-See MAR. Titrate off propofol first.

## 2020-09-13 NOTE — Consult Note (Signed)
[name] for Infectious Disease       Reason for Consult: persistent fever    Referring Physician: Dr. Chase Caller  Principal Problem:   Necrotizing pneumonia [name]) Active Problems:   Opioid use disorder   Chronic pain syndrome   Encounter for chest tube placement   CAP (community acquired pneumonia)   Acute respiratory failure with hypoxia ([name])   Sepsis ([name])   Chest pain   Pleural effusion   Thoracic aortic aneurysm without rupture ([name])   Pressure injury of skin   MRSA pneumonia ([name])   Acute hypoxemic respiratory failure ([name])   . acetylcysteine  4 mL Nebulization BID  . amiodarone  200 mg Per Tube Daily  . chlorhexidine gluconate (MEDLINE KIT)  15 mL Mouth Rinse BID  . Chlorhexidine Gluconate Cloth  6 each Topical Daily  . clonazePAM  2 mg Per Tube BID  . docusate  100 mg Per Tube BID  . [START ON 09/14/2020] enoxaparin (LOVENOX) injection  100 mg Subcutaneous Q12H  . feeding supplement (PIVOT 1.5 CAL)  1,000 mL Per Tube Q24H  . feeding supplement (PROSource TF)  45 mL Per Tube Daily  . folic acid  1 mg Per Tube Daily  . ketamine  1.5 mg/kg (Ideal) Intravenous Once  . mouth rinse  15 mL Mouth Rinse 10 times per day  . multivitamin  15 mL Per Tube Daily  . nystatin   Topical BID  . pantoprazole sodium  40 mg Per Tube Daily  . phenylephrine      . potassium chloride  40 mEq Per Tube Daily  . QUEtiapine  100 mg Per Tube BID  . sodium chloride flush  10-40 mL Intracatheter Q12H  . thiamine  100 mg Per Tube Daily    Recommendations: Will stop linezolid and start ceftaroline Agree with TEE   Assessment: He has a large cavitary necrotizing pneumonia with MRSA and has had persistent fevers up to 101 and has had few periods without fever.  His WBC has been stable and blood cultures have been negative from 08/28/20. Respiratory culture has only grown MRSA.  He has been on vancomycin since 2/20.  He was changed to linezolid 3/4 but still with persistent fever.  I  suspect his ongoing fever is most likely from his high burden of disease and will take time to defervesce.  At this point, I would less suspect some element of withdrawal but certainly a consideration.  I will change antibiotics as above with consideration of endocarditis as well and await TEE results.   Antibiotics: Vancomycin 2/20 - 3/4 linezolid 3/4 - 3/8 ceftaroline 3/8 - present  HPI: Joseph Ayala is a 55 y.o. male with HTN, necrotizing myopathy, opioid use disorder on suboxone and significant alcohol history who presented on 2/20 with chest pain and noted multifocal opacities and developed into necrotizing pneumonia and empyema with chest tube placement on right on 2/24.  Culture has been consistently with MRSA.  He remains intubated and had noted a new finding (no previous echo) of a low EF of 35-40%.     Review of Systems:  Unable to be assessed due to patient factors  Past Medical History:  Diagnosis Date  . Degenerative disc disease, lumbar   . Dental crown present   . ETOH abuse   . History of kidney stones   . Hypertension    states under control with meds., has been on med. x 1 yr. - is currently out of med.,  to see PCP 02/06/2018  . Muscle atrophy 01/2018  . Muscle weakness 01/2018  . Necrotizing myopathy   . Opioid use disorder   . Thoracic aortic aneurysm (TAA) ([place])    a. seen on CT 08/2020.    Social History   Tobacco Use  . Smoking status: Never Smoker  . Smokeless tobacco: Former Network engineer  . Vaping Use: Never used  Substance Use Topics  . Alcohol use: Yes    Comment: occasionally  . Drug use: No    Family History  Problem Relation Age of Onset  . Varicose Veins Brother     Allergies  Allergen Reactions  . Vicodin [Hydrocodone-Acetaminophen] Nausea And Vomiting    Physical Exam: Constitutional: intubated and sedated Vitals:   09/13/20 1400 09/13/20 1446  BP: (!) 89/56   Pulse: 94   Resp: 17   Temp: (!) 100.94 F (38.3 C)   SpO2:  95% 100%   EYES: anicteric ENMT: +ET Cardiovascular: Cor Tachy Respiratory: clear; Musculoskeletal: no pedal edema noted Skin: negatives: no rash   Lab Results  Component Value Date   WBC 15.0 (H) 09/12/2020   HGB 8.8 (L) 09/12/2020   HCT 29.3 (L) 09/12/2020   MCV 100.7 (H) 09/12/2020   PLT 435 (H) 09/12/2020    Lab Results  Component Value Date   CREATININE 0.45 (L) 09/13/2020   BUN 16 09/13/2020   NA 143 09/13/2020   K 3.9 09/13/2020   CL 106 09/13/2020   CO2 29 09/13/2020    Lab Results  Component Value Date   ALT 82 (H) 09/10/2020   AST 65 (H) 09/10/2020   ALKPHOS 186 (H) 09/10/2020     Microbiology: Recent Results (from the past 240 hour(s))  Culture, respiratory     Status: None   Collection Time: 09/03/20  4:10 PM   Specimen: Bronchial Wash; Respiratory  Result Value Ref Range Status   Specimen Description   Final    BRONCHIAL WASHINGS RIGHT Performed at [facility], [place] [address] [place], [place] [address]    Special Requests   Final    NONE Performed at [facility], [place] [address], [place], [place] [address]    Gram Stain   Final    ABUNDANT WBC PRESENT, PREDOMINANTLY PMN FEW GRAM POSITIVE COCCI IN PAIRS Performed at [facility], [place] [address], [place], [place] [address]    Culture   Final    ABUNDANT METHICILLIN RESISTANT STAPHYLOCOCCUS AUREUS   Report Status 09/06/2020 FINAL  Final   Organism ID, Bacteria METHICILLIN RESISTANT STAPHYLOCOCCUS AUREUS  Final      Susceptibility   Methicillin resistant staphylococcus aureus - MIC*    CIPROFLOXACIN >=8 RESISTANT Resistant     ERYTHROMYCIN >=8 RESISTANT Resistant     GENTAMICIN <=0.5 SENSITIVE Sensitive     OXACILLIN >=4 RESISTANT Resistant     TETRACYCLINE <=1 SENSITIVE Sensitive     VANCOMYCIN <=0.5 SENSITIVE Sensitive     TRIMETH/SULFA 160 RESISTANT Resistant     CLINDAMYCIN >=8 RESISTANT Resistant     RIFAMPIN <=0.5 SENSITIVE Sensitive      Inducible Clindamycin NEGATIVE Sensitive     * ABUNDANT METHICILLIN RESISTANT STAPHYLOCOCCUS AUREUS  Fungus Culture With Stain     Status: None (Preliminary result)   Collection Time: 09/03/20  4:10 PM   Specimen: Bronchial Washing, Right  Result Value Ref Range Status   Fungus Stain Final report  Final    Comment: (NOTE) Performed At:  [facility]  [address] [place], [place] [id number] Rush Farmer MD VF:[id number]    Fungus (Mycology) Culture PENDING  Incomplete   Fungal Source BRONCHIAL WASHINGS  Final    Comment:  RIGHT Performed at [facility], [place] [address] [place], [place] [address]   Fungus Culture Result     Status: None   Collection Time: 09/03/20  4:10 PM  Result Value Ref Range Status   Result 1 Comment  Final    Comment: (NOTE) KOH/Calcofluor preparation:  no fungus observed. Performed At: [facility] [place], [place] [id number] Rush Farmer MD [id number]   Culture, respiratory     Status: None   Collection Time: 09/07/20  2:25 PM   Specimen: Bronchoalveolar Lavage; Respiratory  Result Value Ref Range Status   Specimen Description   Final    BRONCHIAL ALVEOLAR LAVAGE Performed at [place] [address], [place], [place] [address]    Special Requests   Final    NONE Performed at [facility], [place] [address], [place], [place] [address]    Gram Stain   Final    ABUNDANT WBC PRESENT, PREDOMINANTLY PMN MODERATE GRAM POSITIVE COCCI IN CLUSTERS Performed at [facility], [place] [address], [place], [place] [address]    Culture FEW METHICILLIN RESISTANT STAPHYLOCOCCUS AUREUS  Final   Report Status 09/10/2020 FINAL  Final   Organism ID, Bacteria METHICILLIN RESISTANT STAPHYLOCOCCUS AUREUS  Final      Susceptibility   Methicillin resistant staphylococcus aureus - MIC*    CIPROFLOXACIN >=8 RESISTANT Resistant     ERYTHROMYCIN >=8 RESISTANT Resistant      GENTAMICIN <=0.5 SENSITIVE Sensitive     OXACILLIN >=4 RESISTANT Resistant     TETRACYCLINE <=1 SENSITIVE Sensitive     VANCOMYCIN 1 SENSITIVE Sensitive     TRIMETH/SULFA >=320 RESISTANT Resistant     CLINDAMYCIN >=8 RESISTANT Resistant     RIFAMPIN <=0.5 SENSITIVE Sensitive     Inducible Clindamycin NEGATIVE Sensitive     * FEW METHICILLIN RESISTANT STAPHYLOCOCCUS AUREUS    Thayer Headings, [place] for Infectious Disease [place] [email or web address] 09/13/2020, 4:05 PM

## 2020-09-13 NOTE — Procedures (Signed)
Diagnostic Bronchoscopy Procedure Note   Joseph Ayala  [id number]  08/04/1965  Date:09/13/20  Time:2:40 PM   Provider Performing:Edgard Debord  Procedure: Diagnostic Bronchoscopy ([address]2376) with lavaage  Indication(s) Rt lung collapse in setting of NEcrotizing MRSA Pneumonia   Consent Risks of the procedure as well as the alternatives and risks of each were explained to the patient and/or caregiver.  Consent for the procedure was obtained.   Anesthesia patioent on vent. Sedated, FIo2 wsa increased to 100% NEo stick x 74mL givne for soft BP  Time Out Verified patient identification, verified procedure, site/side was marked, verified correct patient position, special equipment/implants available, medications/allergies/relevant history reviewed, required imaging and test results available.   Sterile Technique Usual hand hygiene, masks, gowns, and gloves were used   Procedure Description Bronchoscope advanced through endotracheal tube and into airway.  Thick red brown mucus was sucked out and clogged the T piece. This was then removed. AFter this, patient had signfiicant amount of red brown purulent secretions that needed combination of bronchoscope and suction catheter and saline to suction.  After suctioning out tracheal secretions, bronchoscope used to provide direct visualization and confirmation that no more airway obstruction in RMB and LMB . After this bronch removed   Complications/Tolerance None; patient tolerated the procedure well..   EBL None   Specimen(s) Trachel aspirate for gram stain and culture  Will get CT chest Will get TEE Will get ID consult      SIGNATURE    Dr. Kalman Shan, M.D., F.C.C.P,  Pulmonary and Critical Care Medicine Staff Physician, [facility] Director - Interstitial Lung Disease  Program  Pulmonary Fibrosis [facility] at [facility] [place], [place], [address]  Pager: [phone], If no answer  OR between  19:00-7:00h: page [phone] Telephone (clinical office): [phone] Telephone (research): [phone]  2:43 PM 09/13/2020

## 2020-09-13 NOTE — Progress Notes (Signed)
Bedside glucose = 64. Hypoglycemia protocol initiated with Juice per protocol.

## 2020-09-13 NOTE — Progress Notes (Addendum)
NAME:  Joseph Ayala, MRN:  [id number], DOB:  January 17, 1966, LOS: 16 ADMISSION DATE:  08/27/2020, CONSULTATION DATE:  08/28/20 REFERRING MD:  Hughie Closs, MD CHIEF COMPLAINT:  Acute hypoxemic respiratory failure  Brief History:  55 year old male on Suboxone for necrotizing myopathy admitted 2/19 with acute hypoxemic respiratory failure secondary to MRSA community-acquired pneumonia / aspiration. PCCM initially consulted for evaluation for bronchoscopy for possible mucous plugging. Transferred to ICU after worsening respiratory failure and mental status requiring intubation on 2/21. ICU course complicated by hypotension requiring pressors, chest tube placement for empyema s/p intrapleural lytics.   Past Medical History:  Hypertension, necrotizing myopathy, opioid use on Suboxone  Significant Hospital Events:  2/19 Arrived to ED for cough and chest pain x 1 week 2/20 Admitted to [facility] in early am. Intubated. Bronch 2/22 Worsening respiratory status and vasopressor requirement. Agitation on vent 2/23 Improving pressor requirement 2/24 Off pressors. Right chest tube placement. Propofol changed versed as TRG increased 2/25 About 650 out of CT since insertion. Stable vent requirements. Sedation and agitation a challenge / librium added. Fever. TPA into chest tube 2/26 FOB for mucus plugging 2/27 Briefly on neo overnight / thought sedation related  2/28 Repeat CT with little residual pleural fluid, largely consolidated lung. On vent. Oral oxy added.  3/01 Ceiling of IV gtt's reduced, weaning on PSV 10/5 - weaned for 8h. Diuresis.   3/02 Sedation changed, hypotension, levophed initiated. FOB with BAL, chest tube removed.   3/04 Discontinue aline, PICC placed LUE 3/05 Increase oral oxy, wean dilaudid 3/06 Improved fever curve on linezolid, challenging sedation 3/07 Difficulties with sedation > changed back to versed, dilaudid. Oral methadone added.  3/08 R white out on CXR / mucus plugging. Methadone  stopped, re-challenge with ketamine   Consults:  TRH PCCM  Procedures:  ETT 2/21 >>  R Radial Aline 2/20 >> 3/4  L IJ TLC 2/21 >> 3/4 R CT 2/24 >> 3/2 LUE PICC 3/4 >>   Significant Diagnostic Tests:   CTA Chest 2/19 >> right sided consolidation and ground glass opacities in the right and middle lobe. Background centrilobular emphysema present. No pulmonary embolism  CT CAP 2/24 >> Interval enlargement of right pleural effusion with some loculation. Progression of right airspace disease with cavitation in right middle/lower lobe. Anasarca  CT Chest w/o 2/28 >> no large amount of residual pleural fluid present with most of the low-density abnormality in the right mid to lower chest felt to represent necrotic and consolidated RLL, RML. There is still some fluid component in the posterior pleural space superior to the tip of the indwelling chest tube  Micro Data:  Blood culture 2/19 >> negative  BAL resp cx 2/20 >> MRSA BAL fungal cx 2/20 >> no fungal elements observed on stain >> BAL AFB cx 2/20 >> no AFB on smear >>  Urine strep ag 2/21 >> neg Urine legionella ag 2/21 >> neg Pleural fluid 2/24 >> ? If sent  BAL 2/26 >> MRSA  BAL 3/2 >> few MRSA    Antimicrobials:  Ceftriaxone 2/20 >> 2/21 Azithro 2/20 >> 2/21 Vanc 2/21 >> 3/4  Zosyn 2/21 >> 2/23 Linezolid 3/4 >>   Interim History / Subjective:  Tmax 101.4  CXR concerning for mucus plugging / white out on right  CVP 8 UOP 3.6L, stool, -1.9L in last 24 hours  Await EKG for QTc  Episode of hypoglycemia overnight > CBG 64, treated   Objective   Blood pressure 116/78, pulse 94, temperature  100 F (37.8 C), temperature source Bladder, resp. rate 20, height 5\' 11"  (1.803 m), weight 100.1 kg, SpO2 91 %. CVP:  [6 mmHg-8 mmHg] 8 mmHg  Vent Mode: PRVC FiO2 (%):  [50 %-100 %] 50 % Set Rate:  [16 bmp] 16 bmp Vt Set:  [600 mL] 600 mL PEEP:  [5 cmH20] 5 cmH20 Plateau Pressure:  [17 cmH20-25 cmH20] 20 cmH20    Intake/Output Summary (Last 24 hours) at 09/13/2020 1610 Last data filed at 09/13/2020 0353 Gross per 24 hour  Intake 2231.61 ml  Output 4175 ml  Net -1943.39 ml   Filed Weights   09/11/20 0425 09/12/20 0000 09/12/20 0334  Weight: 98.6 kg 100.1 kg 100.1 kg   Physical Exam: General: ill appearing adult male lying in bed on vent in NAD, periods of intermittent agitation  HEENT: MM pink/moist, ETT, anicteric, pupils reactive  Neuro: awakens to voice, will follow simple commands, MAE, makes eye contact CV: s1s2 RRR, no m/r/g PULM: non-labored on vent, diminished breath sounds on right, rhonchi on left  GI: soft, bsx4 active  Extremities: warm/dry, 1-2+ pitting peripheral LE edema, dependent edema in  hands Skin: improving bilateral foot erythema / non-blanching   PCXR 3/8 >> images personally reviewed, near complete opacification on right / mucus plugging   Resolved Hospital Problem list   Elevated LFTs - likely combination of hx and congestive hepatopathy AKI Aspiration pneumonia/pneumonitis in setting of altered mental status Septic shock (off pressors as of 2/24) Cardiogenic shock   Assessment & Plan:   Acute hypoxemic respiratory failure secondary to MRSA PNA with Effusion   Parapneumonic Effusion versus Empyema  Mucus Plugging  S/p chest tube placement 2/24, tPA 2/25, 2/26.  Chest tube removed 3/2.  -PRVC 8cc/kg  -daily SBT / WUA -planned trach 3/9 per Dr. Jearld Fenton -continue linezolid  -add 5 days BID mucomyst with secretions  -will need FOB for clearance >    -follow intermittent CXR  -chest PT Q4  Septic +/- component Cardiogenic Shock secondary to MRSA PNA Additional sedation effect and diuresis  -linezolid  -follow CVP, diuresis based on CVP rather than peripheral edema due to third spacing  -lasix 40 mg IVx1 with KCL  New onset heart failure with ejection fraction of 35 to 40% New onset atrial flutter/SVT secondary to sepsis Demand ischemia Thoracic Aneurysm -  will need outpatient follow up  Cardiology signed off > would like call back once more stable to titrate HF medications / follow up  -continue amiodarone PT  -full dose lovenox, will hold for trach in am -tele monitoring  -CVP Qshift  -follow QTc closely with seroquel, haldol, amiodarone, methadone ( 3/8) -lasix as above   Acute Metabolic Encephalopathy / Agitated Delirium  Significant Alcohol use and Narcotic Dependence Concern for withdrawal and sepsis -hold home suboxone  -versed gtt added 3/7 pm  -stop methadone   -klonopin 2mg  BID  -seroquel 100 mg BID  -will re-challenge ketamine at higher dose 3/8 - discussed with Palliative Care MD, CCM MD -RASS Goal -1 to -2  -anticipate trach will allow him to be more comfortable and will allow significant reduction in sedation  -PT efforts as able   Hypokalemia  -KCL as above  -follow electrolytes, replace as indicated  Moderate Protein Calorie Malnutrition  -TF per Nutrition   Macrocytic Anemia  -trend CBC  -folate, thiamine, MVI   Best practice (evaluated daily)  Diet: TF Pain/Anxiety/Delirium protocol (if indicated): PAD goal -1 to -2 VAP protocol (if indicated): Yes DVT  prophylaxis: Lovenox, treatment dose GI prophylaxis: Protonix Glucose control: CBG q4h Mobility: As tolerated Disposition: ICU  Goals of Care:  Last date of multidisciplinary goals of care discussion:  Family and staff present:  Summary of discussion:  Follow up goals of care discussion due: 3/14 Code Status: Full code.    Family: Wife updated 3/7 via phone.  Have called this am and message left to discuss bronchoscopy.  Will follow up with family on arrival.    Critical Care Time: 36 minutes   Canary Brim, MSN, APRN, NP-C, AGACNP-BC [facility] 09/13/2020, 8:52 AM   Please see [email or web address] for pager details.   From 7A-7P if no response, please call [phone] After hours, please call ELink [phone]

## 2020-09-13 NOTE — Progress Notes (Signed)
Assisted with transporting PT from [facility] ICU 1228 to [facility] CT and back while on vent at 100% Fi02- uneventful. RN is currently at bedside.

## 2020-09-13 NOTE — Progress Notes (Signed)
During Bronchoscopy, BP low 80s- RN administered 80 mcg Phenylephrine per MD order at bedside.

## 2020-09-13 NOTE — Progress Notes (Signed)
Assisted with bedside bronch- uneventful. Sample has been collected and RN aware it is ready to be sent to Lab.

## 2020-09-13 NOTE — Progress Notes (Signed)
Palliative Care Progress Note  Discussed case with CCM in detail and palliative care will continue to support in complex symptom management. Joseph Ayala continues to struggle with adequate sedation while on mechanical ventilation for necrotizing pneumonia. Traditional sedation dosing has been limited by hypotension, high opioid tolerance, prolonged alcohol withdrawal, emergence agitation and delirium.His failure to be sedated is adversely affecting his ventilation and also the ability to assess for weaning and vent liberation.  An attempt was made on 3/5 to initiate ketamine but due continued agitation and hypotension it was discontinued due to concerns for paradoxical effect. He is very sensitive to propofol and is on Levophed for Hypotension. His ICU sedation management has been highly variable with multiple adjuvants and medications being trialed without clear improvement including antipsychotics, methadone, IV opioids, precedex, and benzodiazepines.  My current recommendations are as follow:  1. Reconsider Ketamine with a more rapid dose titration, he will likely need higher than average doses to achieve symptom management - Ketamine is the most appropriate sedative given his refractory agitation, negative hemodynamics from other sedatives, and drug use history. Our goal should be Ketamine as a single use agent with a basal rate that controls his pain and agitation at baseline and a bolus dose that can be used for breakthrough symptoms. The Ketamine will serve an important role in medical detox -essentially resetting his opioid receptors so that traditional doses of opioids will be effective and safe for him as we wean him off the infusion.He may also be having hydromorphone related opioid induced neurotoxicity at the high continuous doses he has been receiving.  Dosing: Ketamine basal rate between 0.5-2mg /kg/hr depending on control of his agitation, he had very little response at 0.1mg /kg dose and  other sedatives and analgesics were on board.  He is currently mechanically ventilated and there is minimal risk for serious toxicity in general with letamine, therefore I would start with highest recommended dose (on our protocol it is 1.5 mg/kg/hr). Can use a bolus dose (1mg /kg) every 4 hours PRN if unexpected agitation occurs despite maximum basal doses. After 12-24 hours recommend discontinuation of propofol followed by discontinuation of levophed as his pressures allow. After symptoms are controlled reduce hydromorphone infusion by 50% and then discontinue after 12 hours if maintaining comfort. Once he has demonstrated consistent sedation for 24 hours can begin to gradually decrease the ketamine infusion in  over the course of several days in 0.2mg /kg/hr increments every 12-24 hours.. The longer he is on the infusion, generally 3-5 days, the more likely it is that he will sustain the long term benefits of having his opoid receptors reset and respond favorably to future attempts at managing his symptoms. Prior to discontinuation of the Ketamine infusion would recommend administering a dose of Diazapam 5-10mg  IV to reduce emergence agitation. Additionally standard doses of opoid's may be administered for pain and distress during and following ketamine infusion.  Adjuvant symptom management recommendations: Maintain Seroquel 100mg  BID per tube  Additional Recommendations: The etiology of his refractory agitation is likely related to long standing polysubstance and alcohol use and the resulting tolerance and neurodegenerative outcomes associated with these disorders.Based on chart review he was struggling with unusual neuromuscular issues and cognitive issues prior to admission. Given his immunocompromised state and persistent encephaolopathy, brain imaging would be reasonable when he is able to tolerate this.  At the present time, prognostication, recovery potential and anticipated outcomes are unclear  as they pertain to his cognitive status and functional status, but he is facing serious,  possibly life limiting illness. I will reach out to his wife to offer support.  Joseph Malta, DO Palliative Medicine   Time: 35 minutes Greater than 50%  of this time was spent counseling and coordinating care related to the above assessment and plan.

## 2020-09-13 NOTE — Progress Notes (Signed)
Sent ordered sample to Lab.

## 2020-09-14 ENCOUNTER — Encounter (HOSPITAL_COMMUNITY)
Admission: EM | Disposition: A | Discharge status: Rehabilitation Facility | Payer: Self-pay | Source: Home / Self Care | Attending: Pulmonary Disease

## 2020-09-14 ENCOUNTER — Inpatient Hospital Stay (HOSPITAL_COMMUNITY): Payer: Medicare HMO | Admitting: Certified Registered Nurse Anesthetist

## 2020-09-14 ENCOUNTER — Inpatient Hospital Stay (HOSPITAL_COMMUNITY): Payer: Medicare HMO

## 2020-09-14 DIAGNOSIS — J15212 Pneumonia due to Methicillin resistant Staphylococcus aureus: Secondary | ICD-10-CM | POA: Diagnosis not present

## 2020-09-14 DIAGNOSIS — R410 Disorientation, unspecified: Secondary | ICD-10-CM

## 2020-09-14 DIAGNOSIS — J850 Gangrene and necrosis of lung: Secondary | ICD-10-CM | POA: Diagnosis not present

## 2020-09-14 DIAGNOSIS — J9690 Respiratory failure, unspecified, unspecified whether with hypoxia or hypercapnia: Secondary | ICD-10-CM | POA: Diagnosis not present

## 2020-09-14 DIAGNOSIS — G9340 Encephalopathy, unspecified: Secondary | ICD-10-CM

## 2020-09-14 DIAGNOSIS — J9601 Acute respiratory failure with hypoxia: Secondary | ICD-10-CM | POA: Diagnosis not present

## 2020-09-14 DIAGNOSIS — J869 Pyothorax without fistula: Secondary | ICD-10-CM | POA: Diagnosis not present

## 2020-09-14 DIAGNOSIS — B9562 Methicillin resistant Staphylococcus aureus infection as the cause of diseases classified elsewhere: Secondary | ICD-10-CM | POA: Diagnosis not present

## 2020-09-14 HISTORY — PX: TRACHEOSTOMY TUBE PLACEMENT: SHX814

## 2020-09-14 LAB — GLUCOSE, CAPILLARY
Glucose-Capillary: 105 mg/dL — ABNORMAL HIGH (ref 70–99)
Glucose-Capillary: 59 mg/dL — ABNORMAL LOW (ref 70–99)
Glucose-Capillary: 85 mg/dL (ref 70–99)
Glucose-Capillary: 80 mg/dL (ref 70–99)
Glucose-Capillary: 132 mg/dL — ABNORMAL HIGH (ref 70–99)
Glucose-Capillary: 133 mg/dL — ABNORMAL HIGH (ref 70–99)
Glucose-Capillary: 132 mg/dL — ABNORMAL HIGH (ref 70–99)

## 2020-09-14 LAB — CBC
HCT: 29.9 % — ABNORMAL LOW (ref 39.0–52.0)
Hemoglobin: 9.1 g/dL — ABNORMAL LOW (ref 13.0–17.0)
MCH: 30.3 pg (ref 26.0–34.0)
MCHC: 30.4 g/dL (ref 30.0–36.0)
MCV: 99.7 fL (ref 80.0–100.0)
Platelets: 421 10*3/uL — ABNORMAL HIGH (ref 150–400)
RBC: 3 MIL/uL — ABNORMAL LOW (ref 4.22–5.81)
RDW: 15.1 % (ref 11.5–15.5)
WBC: 16.1 10*3/uL — ABNORMAL HIGH (ref 4.0–10.5)
nRBC: 0 % (ref 0.0–0.2)

## 2020-09-14 LAB — BASIC METABOLIC PANEL
Anion gap: 10 (ref 5–15)
BUN: 15 mg/dL (ref 6–20)
CO2: 29 mmol/L (ref 22–32)
Calcium: 8.7 mg/dL — ABNORMAL LOW (ref 8.9–10.3)
Chloride: 103 mmol/L (ref 98–111)
Creatinine, Ser: 0.46 mg/dL — ABNORMAL LOW (ref 0.61–1.24)
GFR, Estimated: >60 mL/min (ref >60)
Glucose, Bld: 102 mg/dL — ABNORMAL HIGH (ref 70–99)
Potassium: 4.3 mmol/L (ref 3.5–5.1)
Sodium: 142 mmol/L (ref 135–145)

## 2020-09-14 LAB — HEPATIC FUNCTION PANEL
ALT: 55 U/L — ABNORMAL HIGH (ref 0–44)
AST: 35 U/L (ref 15–41)
Albumin: 2.2 g/dL — ABNORMAL LOW (ref 3.5–5.0)
Alkaline Phosphatase: 161 U/L — ABNORMAL HIGH (ref 38–126)
Bilirubin, Direct: <0.1 mg/dL (ref 0.0–0.2)
Total Bilirubin: 0.6 mg/dL (ref 0.3–1.2)
Total Protein: 6.3 g/dL — ABNORMAL LOW (ref 6.5–8.1)

## 2020-09-14 LAB — PHOSPHORUS: Phosphorus: 3.8 mg/dL (ref 2.5–4.6)

## 2020-09-14 LAB — TRIGLYCERIDES: Triglycerides: 184 mg/dL — ABNORMAL HIGH (ref <150)

## 2020-09-14 LAB — PROTIME-INR
INR: 1.1 (ref 0.8–1.2)
Prothrombin Time: 13.9 seconds (ref 11.4–15.2)

## 2020-09-14 IMAGING — DX DG CHEST 1V PORT
1 series · 1 of 1 positions shown · non-contrast
Comparison: CT [DATE].  Chest x-ray [DATE].

CLINICAL DATA: Acute respiratory failure.

EXAM:
PORTABLE CHEST 1 VIEW

[chest ap]
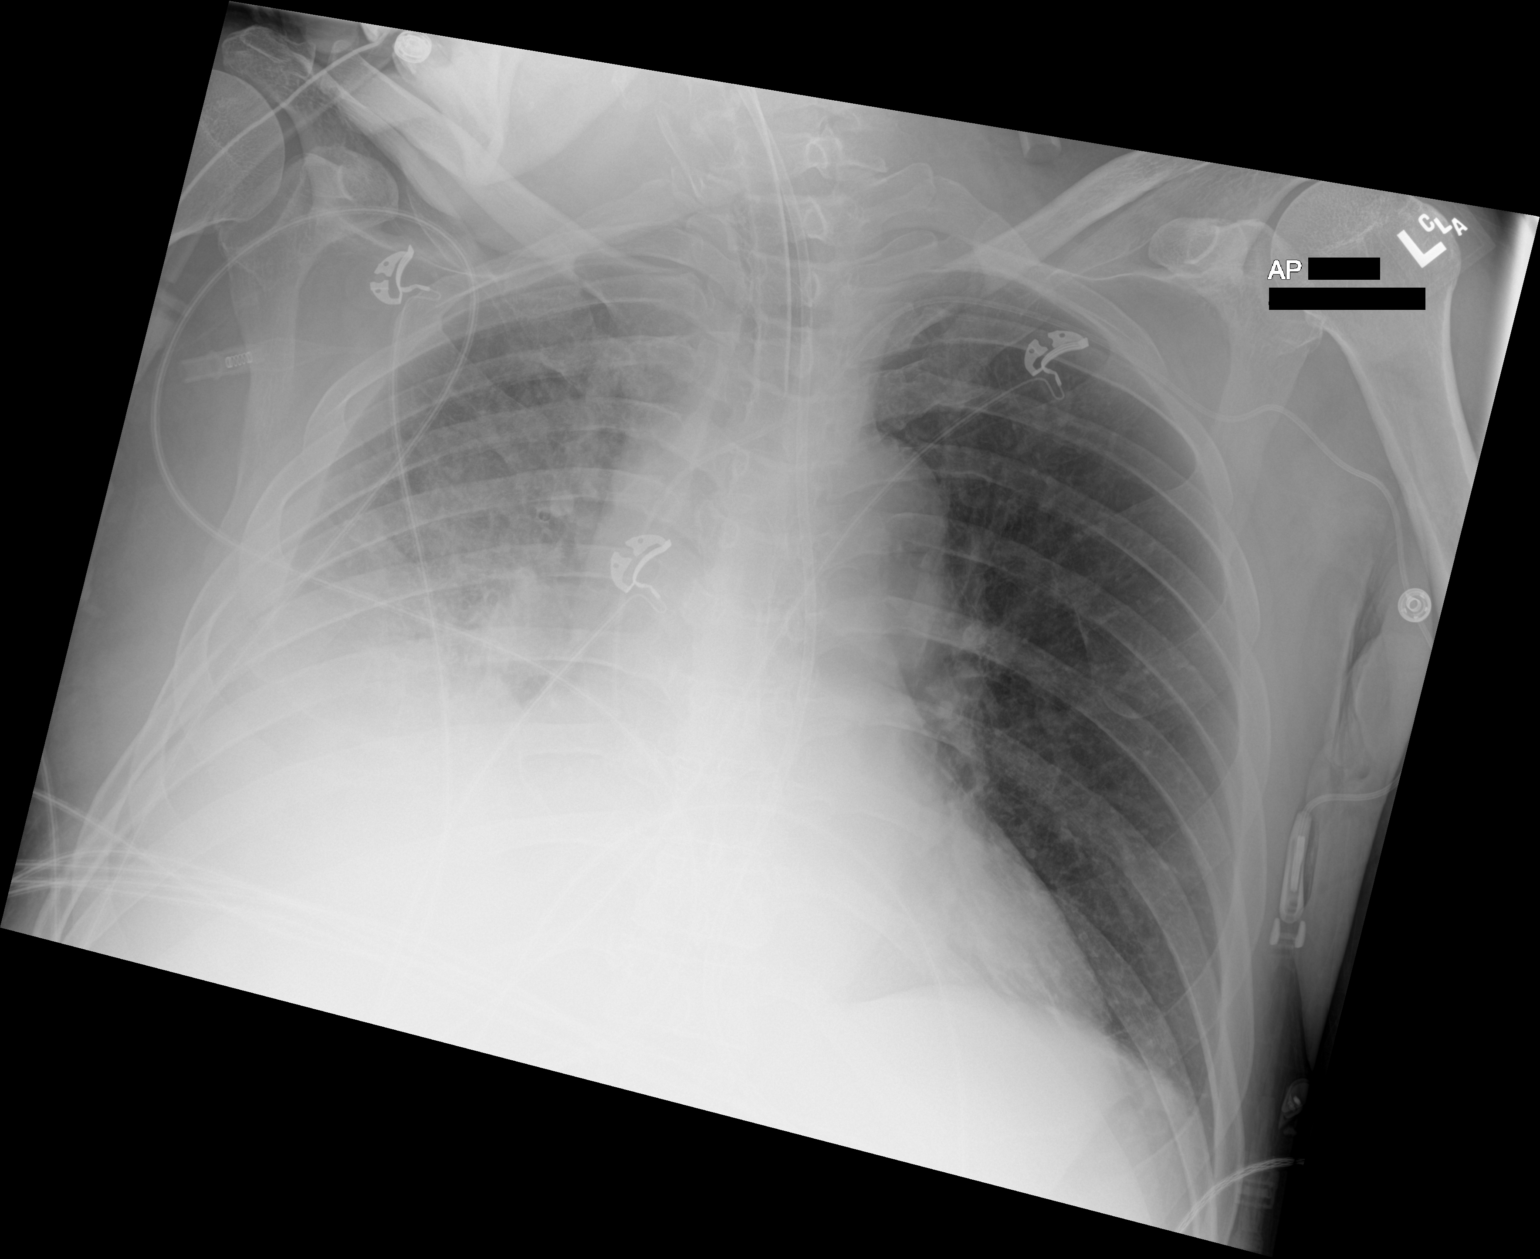

[1 of 1 positions shown; findings below may reference images not displayed]

FINDINGS: Endotracheal tube feeding tube in stable position. Left PICC line
noted with tip at cavoatrial junction. Heart size normal.
Re-expansion of the right lung noted. Diffuse right lung atelectatic
changes and or infiltrate. Moderate right pleural effusion. No
pneumothorax.
IMPRESSION: 1. Endotracheal tube and feeding tube in stable position. Left PICC
line noted with tip at cavoatrial junction.
2. Re-expansion of the right lung noted. Diffuse right lung
atelectatic changes and or infiltrate. Moderate right pleural
effusion. No pneumothorax.

## 2020-09-14 SURGERY — CREATION, TRACHEOSTOMY
Anesthesia: General | Site: Neck

## 2020-09-14 MED ORDER — 0.9 % SODIUM CHLORIDE (POUR BTL) OPTIME
TOPICAL | Status: DC | PRN
  Administered 2020-09-14: 1000 mL

## 2020-09-14 MED ORDER — PHENYLEPHRINE 40 MCG/ML (10ML) SYRINGE FOR IV PUSH (FOR BLOOD PRESSURE SUPPORT)
PREFILLED_SYRINGE | INTRAVENOUS | Status: DC | PRN
  Administered 2020-09-14: 120 ug via INTRAVENOUS
  Administered 2020-09-14: 80 ug via INTRAVENOUS

## 2020-09-14 MED ORDER — ONDANSETRON HCL 4 MG/2ML IJ SOLN
INTRAMUSCULAR | Status: DC | PRN
  Administered 2020-09-14: 4 mg via INTRAVENOUS

## 2020-09-14 MED ORDER — LIDOCAINE-EPINEPHRINE (PF) 1 %-1:200000 IJ SOLN
INTRAMUSCULAR | Status: AC
  Filled 2020-09-14: qty 30

## 2020-09-14 MED ORDER — ROCURONIUM BROMIDE 10 MG/ML (PF) SYRINGE
PREFILLED_SYRINGE | INTRAVENOUS | Status: DC | PRN
  Administered 2020-09-14: 40 mg via INTRAVENOUS

## 2020-09-14 MED ORDER — FENTANYL CITRATE (PF) 100 MCG/2ML IJ SOLN
INTRAMUSCULAR | Status: AC
  Filled 2020-09-14: qty 2

## 2020-09-14 MED ORDER — PHENYLEPHRINE 40 MCG/ML (10ML) SYRINGE FOR IV PUSH (FOR BLOOD PRESSURE SUPPORT)
PREFILLED_SYRINGE | INTRAVENOUS | Status: AC
  Filled 2020-09-14: qty 10

## 2020-09-14 MED ORDER — DEXAMETHASONE SODIUM PHOSPHATE 10 MG/ML IJ SOLN
INTRAMUSCULAR | Status: DC | PRN
  Administered 2020-09-14: 8 mg via INTRAVENOUS

## 2020-09-14 MED ORDER — LACTATED RINGERS IV SOLN: INTRAVENOUS | Status: DC | PRN

## 2020-09-14 MED ORDER — PROPOFOL 10 MG/ML IV BOLUS
INTRAVENOUS | Status: AC
  Filled 2020-09-14: qty 20

## 2020-09-14 MED ORDER — EPHEDRINE SULFATE-NACL 50-0.9 MG/10ML-% IV SOSY
PREFILLED_SYRINGE | INTRAVENOUS | Status: DC | PRN
  Administered 2020-09-14 (×2): 10 mg via INTRAVENOUS

## 2020-09-14 MED ORDER — PROPOFOL 500 MG/50ML IV EMUL
INTRAVENOUS | Status: AC
  Filled 2020-09-14: qty 50

## 2020-09-14 MED ORDER — EPHEDRINE 5 MG/ML INJ
INTRAVENOUS | Status: AC
  Filled 2020-09-14: qty 10

## 2020-09-14 MED ORDER — DEXTROSE 50 % IV SOLN
INTRAVENOUS | Status: AC
  Administered 2020-09-14: 25 mL
  Filled 2020-09-14: qty 50

## 2020-09-14 SURGICAL SUPPLY — 35 items
BLADE CLIPPER SURG (BLADE) ×2 IMPLANT
BLADE SURG 15 STRL LF DISP TIS (BLADE) ×1 IMPLANT
BLADE SURG 15 STRL SS (BLADE) ×2
BLADE SURG SZ11 CARB STEEL (BLADE) ×2 IMPLANT
CLEANER TIP ELECTROSURG 2X2 (MISCELLANEOUS) ×2 IMPLANT
COVER SURGICAL LIGHT HANDLE (MISCELLANEOUS) ×2 IMPLANT
COVER WAND RF STERILE (DRAPES) IMPLANT
ELECT COATED BLADE 2.86 ST (ELECTRODE) ×2 IMPLANT
ELECT REM PT RETURN 15FT ADLT (MISCELLANEOUS) ×2 IMPLANT
GAUZE 4X4 16PLY RFD (DISPOSABLE) ×2 IMPLANT
GLOVE SURG LTX SZ7.5 (GLOVE) ×2 IMPLANT
KIT BASIN OR (CUSTOM PROCEDURE TRAY) ×2 IMPLANT
KIT TURNOVER KIT A (KITS) ×2 IMPLANT
NDL HYPO 25X1 1.5 SAFETY (NEEDLE) ×1 IMPLANT
NEEDLE HYPO 25X1 1.5 SAFETY (NEEDLE) ×2 IMPLANT
NS IRRIG 1000ML POUR BTL (IV SOLUTION) ×2 IMPLANT
PACK EENT SPLIT (PACKS) ×2 IMPLANT
PENCIL FOOT CONTROL (ELECTRODE) ×2 IMPLANT
PENCIL SMOKE EVACUATOR (MISCELLANEOUS) ×1 IMPLANT
PROTECTOR NERVE ULNAR (MISCELLANEOUS) ×4 IMPLANT
SPONGE DRAIN TRACH 4X4 STRL 2S (GAUZE/BANDAGES/DRESSINGS) ×2 IMPLANT
STRIP CLOSURE SKIN 1/2X4 (GAUZE/BANDAGES/DRESSINGS) ×2 IMPLANT
SURGILUBE 2OZ TUBE FLIPTOP (MISCELLANEOUS) ×1 IMPLANT
SUT CHROMIC GUT 2 0 PS 2 27 (SUTURE) ×2 IMPLANT
SUT ETHILON 3 0 PS 1 (SUTURE) ×2 IMPLANT
SUT SILK 3 0 (SUTURE) ×2
SUT SILK 3 0 SH 30 (SUTURE) ×2 IMPLANT
SUT SILK 3-0 18XBRD TIE 12 (SUTURE) ×1 IMPLANT
SYR 10ML LL (SYRINGE) ×2 IMPLANT
SYR CONTROL 10ML LL (SYRINGE) ×2 IMPLANT
TOWEL OR 17X26 10 PK STRL BLUE (TOWEL DISPOSABLE) ×2 IMPLANT
TUBE TRACH  6.0 CUFF FLEX (MISCELLANEOUS) ×2
TUBE TRACH 6.0 CUFF FLEX (MISCELLANEOUS) IMPLANT
TUBE TRACH FLEX 6.0 UNCF (MISCELLANEOUS) ×1 IMPLANT
WATER STERILE IRR 1000ML POUR (IV SOLUTION) ×2 IMPLANT

## 2020-09-14 NOTE — Progress Notes (Signed)
NAME:  Joseph Ayala, MRN:  [id number], DOB:  12/24/1965, LOS: 49 ADMISSION DATE:  08/27/2020, CONSULTATION DATE:  08/28/20 REFERRING MD:  Darliss Cheney, MD CHIEF COMPLAINT:  Acute hypoxemic respiratory failure  Brief History:  55 year old male on Suboxone for necrotizing myopathy admitted 2/19 with acute hypoxemic respiratory failure secondary to MRSA community-acquired pneumonia / aspiration. PCCM initially consulted for evaluation for bronchoscopy for possible mucous plugging. Transferred to ICU after worsening respiratory failure and mental status requiring intubation on 2/21. ICU course complicated by hypotension requiring pressors, chest tube placement for empyema s/p intrapleural lytics.   Past Medical History:  Hypertension, necrotizing myopathy, opioid use on Suboxone  Significant Hospital Events:  2/19 Arrived to ED for cough and chest pain x 1 week 2/20 Admitted to [facility] in early am. Intubated. Bronch 2/22 Worsening respiratory status and vasopressor requirement. Agitation on vent 2/23 Improving pressor requirement 2/24 Off pressors. Right chest tube placement. Propofol changed versed as TRG increased 2/25 About 650 out of CT since insertion. Stable vent requirements. Sedation and agitation a challenge / librium added. Fever. TPA into chest tube 2/26 FOB for mucus plugging 2/27 Briefly on neo overnight / thought sedation related  2/28 Repeat CT with little residual pleural fluid, largely consolidated lung. On vent. Oral oxy added.  3/01 Ceiling of IV gtt's reduced, weaning on PSV 10/5 - weaned for 8h. Diuresis.   3/02 Sedation changed, hypotension, levophed initiated. FOB with BAL, chest tube removed.   3/04 Discontinue aline, PICC placed LUE 3/05 Increase oral oxy, wean dilaudid 3/06 Improved fever curve on linezolid, challenging sedation 3/07 Difficulties with sedation > changed back to versed, dilaudid. Oral methadone added.  3/08 R white out on CXR / mucus plugging. Methadone  stopped, re-challenge with ketamine. FOB with BAL.  3/09 Calmer on ketamine at higher dose, on propofol 20 mcg, dilaudid 26m  Consults:  TRH PCCM  Procedures:  ETT 2/21 >>  R Radial Aline 2/20 >> 3/4  L IJ TLC 2/21 >> 3/4 R CT 2/24 >> 3/2 LUE PICC 3/4 >>   Significant Diagnostic Tests:   CTA Chest 2/19 >> right sided consolidation and ground glass opacities in the right and middle lobe. Background centrilobular emphysema present. No pulmonary embolism  CT CAP 2/24 >> Interval enlargement of right pleural effusion with some loculation. Progression of right airspace disease with cavitation in right middle/lower lobe. Anasarca  CT Chest w/o 2/28 >> no large amount of residual pleural fluid present with most of the low-density abnormality in the right mid to lower chest felt to represent necrotic and consolidated RLL, RML. There is still some fluid component in the posterior pleural space superior to the tip of the indwelling chest tube  CT Chest 3/8 >> persistent area of RML, RLL airspace disease with decreasing cavitation and increasing consolidation since prior study, increased airspace disease throughout the aerated RUL, consistent with progressive PNA. Stable LLL consolidation, trace L pleural effusion   TEE 3/10 >>   Micro Data:  Blood culture 2/19 >> negative  BAL resp cx 2/20 >> MRSA BAL fungal cx 2/20 >> no fungal elements observed on stain >> BAL AFB cx 2/20 >> no AFB on smear >>  Urine strep ag 2/21 >> neg Urine legionella ag 2/21 >> neg BAL 2/26 >> MRSA  BAL 3/2 >> few MRSA   BAL 3/9 >>   Antimicrobials:  Ceftriaxone 2/20 >> 2/21 Azithro 2/20 >> 2/21 Vanc 2/21 >> 3/4  Zosyn 2/21 >> 2/23 Linezolid 3/4 >>  3/8  Ceftaroline 3/8 >>   Interim History / Subjective:  Tmax 101.3 / WBC 16.1  ABX changed 3/8 to ceftaroline per ID  Finger stick glucose 59 > ? If inaccurate due to third spacing in hands / edema.  I/O 4L UOP, 150 ml stool, -2.7L in last 24  hours  Objective   Blood pressure (!) 91/54, pulse 94, temperature 99.86 F (37.7 C), resp. rate 18, height _0  (1.803 m), weight 100.1 kg, SpO2 94 %. CVP:  [8 mmHg-9 mmHg] 9 mmHg  Vent Mode: PRVC FiO2 (%):  [40 %-100 %] 40 % Set Rate:  [16 bmp] 16 bmp Vt Set:  [600 mL] 600 mL PEEP:  [5 cmH20] 5 cmH20 Plateau Pressure:  [17 cmH20-21 cmH20] 19 cmH20   Intake/Output Summary (Last 24 hours) at 09/14/2020 1027 Last data filed at 09/14/2020 2449 Gross per 24 hour  Intake 1384.91 ml  Output 4200 ml  Net -2815.09 ml   Filed Weights   09/11/20 0425 09/12/20 0000 09/12/20 0334  Weight: 98.6 kg 100.1 kg 100.1 kg   Physical Exam: General: ill appearing adult male lying in bed on vent in NAD  HEENT: MM pink/moist, ETT, anicteric  Neuro: opens eyes to voice, follows commands, MAE CV: s1s2 RRR, SR 70's on monitor, no m/r/g PULM: non-labored on vent, coarse on right but improved, clear on left, thin pink/brown secretions from ballard suction GI: soft, bsx4 active  Extremities: warm/dry, 1-2+ BLE pitting edema, dependent edema in hands  Skin: no rashes or lesions   CT Chest 3/8 >> images personally reviewed, ongoing dense consolidation on right, no evidence of associated effusion   Resolved Hospital Problem list   Elevated LFTs - likely combination of hx and congestive hepatopathy AKI Aspiration pneumonia/pneumonitis in setting of altered mental status Septic shock (off pressors as of 2/24) Cardiogenic shock   Assessment & Plan:   Acute Hypoxemic Respiratory Failure secondary to MRSA PNA with Effusion  / Necrotizing PNA Parapneumonic Effusion versus Empyema  Mucus Plugging  S/p chest tube placement 2/24, tPA 2/25, 2/26.  Chest tube removed 3/2.  -PRVC 8cc/kg  -trach this am, appreciate Dr. Janace Hoard assistance with patient care -appreciate ID, note abx change -continue mucomyst, D2/5  -continue chest PT Q4 -follow intermittent CXR  -no evidence of recurrent effusion on CT on right  as of 3/9  Septic +/- component Cardiogenic Shock secondary to MRSA PNA Additional sedation effect and diuresis  -abx as above -hold lasix 3/9, reassess 3/10.     New onset heart failure with ejection fraction of 35 to 40% New onset atrial flutter/SVT secondary to sepsis Demand ischemia Thoracic Aneurysm - will need outpatient follow up  Cardiology signed off > would like call back once more stable to titrate HF medications / follow up.   -continue amiodarone PT  -lovenox on hold for trach  -tele monitoring -follow QTc closely  -intermittent diuresis   Acute Metabolic Encephalopathy / Agitated Delirium  Significant Alcohol use and Narcotic Dependence Concern for withdrawal and sepsis -hold home suboxone -continue klonopin, seroquel  -continue ketamine infusion > once returned from OR, will work to wean propofol off, then dilaudid  -see Palliative care note from 3/8 regarding ketamine  Hypokalemia  -monitor, replace as above   Moderate Protein Calorie Malnutrition  -TF per Nutrition with prosource  Macrocytic Anemia  -follow CBC -folate, MVI, thiamine   Best practice (evaluated daily)  Diet: TF Pain/Anxiety/Delirium protocol (if indicated): PAD goal -1 to -2 VAP protocol (if indicated):  Yes DVT prophylaxis: Lovenox, treatment dose GI prophylaxis: Protonix Glucose control: CBG q4h Mobility: As tolerated Disposition: ICU  Goals of Care:  Last date of multidisciplinary goals of care discussion:  Family and staff present:  Summary of discussion:  Follow up goals of care discussion due: 3/14 Code Status: Full code.    Family: Ginger (wife) called 3/9, message left.  Will update on arrival 3/9 afternoon.     Critical Care Time: 108 minutes   Noe Gens, MSN, APRN, NP-C, AGACNP-BC [facility] 09/14/2020, 10:27 AM   Please see [email or web address] for pager details.   From 7A-7P if no response, please call [phone] After hours, please call ELink  [phone]

## 2020-09-14 NOTE — Progress Notes (Signed)
   Dr. Elvera Lennox, Dr. Archer Asa reviewed imaging on Mr. Churchill in room 1228 for possible chest tube; he stated that he cannot tell what his lung and what his fluid  - per Mr Allred PA  Plan  - will continue to monitor - no role for CVTS as of now     SIGNATURE    Dr. Kalman Shan, M.D., F.C.C.P,  Pulmonary and Critical Care Medicine Staff Physician, [facility] Center Director - Interstitial Lung Disease  Program  Pulmonary Fibrosis [facility] at [facility] [place], [place], [address]  Pager: [phone], If no answer  OR between  19:00-7:00h: page [phone] Telephone (clinical office): [phone] Telephone (research): [phone]  12:50 PM 09/14/2020

## 2020-09-14 NOTE — Op Note (Signed)
Preop/postop diagnosis: Respiratory failure Procedure: Tracheotomy Anesthesia: General Estimated blood loss less than 5 cc Indications: 55 year old with a MRSA pneumonia and respiratory failure.  He is endotracheal tube dependent and now needs a tracheotomy.  The wife agrees.  We discussed the risk, benefits, and options.  All her questions were answered and consent was obtained. Operation.  Patient was taken the operating placed supine position after general anesthesia the neck was prepped and draped in the usual sterile manner.  A vertical incision was outlined with a marking pen after marking the sternal notch and the thyroid cartilage notch.  The incision was made just below the cricoid in the skin.  Dissection was carried down to the diastases the strap muscles.  This was divided and the thyroid isthmus was encountered.  The isthmus was divided with electrocautery.  The trachea was then easily visualized.  At cricoid hook was placed and a inferior based flap was created with the Metzenbaums at the second and third tracheal ring and 2-0 chromic was used to suture it to the subcutaneous tissue.  The endotracheal tube was then removed and a #6 Shiley was placed without difficulty.  There was good hemostasis.  Good end-tidal CO2 return.  The balloon was inflated.  The trach was secured with 3-0 silk and trach tie.  The patient was then awakened brought to ICU in stable condition counts correct

## 2020-09-14 NOTE — Progress Notes (Signed)
Family (wife & mother) updated post-procedure at bedside.  Appears comfortable on ketamine dosing.  Will begin wean from propofol.  Ceiling adjusted on propofol.  Will follow closely.    Canary Brim, MSN, APRN, NP-C, AGACNP-BC [facility] 09/14/2020, 2:11 PM   Please see [email or web address] for pager details.   From 7A-7P if no response, please call [phone] After hours, please call ELink [phone]

## 2020-09-14 NOTE — Anesthesia Preprocedure Evaluation (Addendum)
Anesthesia Evaluation  Patient identified by MRN, date of birth, ID band Patient confused  General Assessment Comment:Pt moving to stimulation, sedated and ventilated  Reviewed: Allergy & Precautions, NPO status , Patient's Chart, lab work & pertinent test results  History of Anesthesia Complications Negative for: history of anesthetic complications  Airway Mallampati: Intubated  TM Distance: >3 FB     Dental   Pulmonary pneumonia, unresolved,  VDRF:  presented to ED on 2/19 with shortness of breath and cough.  Found to be hypoxic, chest x-ray showed right lower lobe pneumonia: progressed to necrotizing MRSA pneumonia with septic shock Now off of pressors, yet remains vent dependent, now for trach   breath sounds clear to auscultation + decreased breath sounds      Cardiovascular hypertension, + Peripheral Vascular Disease (Thoracic aortic aneurysm: 67mm)  + dysrhythmias Atrial Fibrillation  Rhythm:Regular Rate:Normal  No longer requiring pressors  08/30/2020 ECHO: EF 35-40%, mildly reduced RV function, mild MR   Neuro/Psych sedated    GI/Hepatic negative GI ROS, (+)     substance abuse  alcohol use, Elevated LFTs   Endo/Other  negative endocrine ROS  Renal/GU negative Renal ROS     Musculoskeletal  (+) narcotic dependent  Abdominal   Peds  Hematology  (+) Blood dyscrasia (Hb 9.1), anemia ,   Anesthesia Other Findings Necrotizing myopathy  Reproductive/Obstetrics                            Anesthesia Physical Anesthesia Plan  ASA: IV  Anesthesia Plan: General   Post-op Pain Management:    Induction: Intravenous and Inhalational  PONV Risk Score and Plan: 2 and Ondansetron  Airway Management Planned: Tracheostomy  Additional Equipment: None  Intra-op Plan:   Post-operative Plan: Post-operative intubation/ventilation  Informed Consent: I have reviewed the patients History  and Physical, chart, labs and discussed the procedure including the risks, benefits and alternatives for the proposed anesthesia with the patient or authorized representative who has indicated his/her understanding and acceptance.     Consent reviewed with POA  Plan Discussed with: CRNA and Surgeon  Anesthesia Plan Comments:        Anesthesia Quick Evaluation

## 2020-09-14 NOTE — Transfer of Care (Signed)
Immediate Anesthesia Transfer of Care Note  Patient: Joseph Ayala  Procedure(s) Performed: TRACHEOSTOMY (N/A Neck)  Patient Location: ICU  Anesthesia Type:General  Level of Consciousness: sedated  Airway & Oxygen Therapy: Patient placed on Ventilator (see vital sign flow sheet for setting)  Post-op Assessment: Report given to RN and Post -op Vital signs reviewed and stable  Post vital signs: Reviewed and stable  Last Vitals:  Vitals Value Taken Time  BP    Temp    Pulse    Resp    SpO2      Last Pain:  Vitals:   09/14/20 0800  TempSrc: Bladder  PainSc:       Patients Stated Pain Goal: 1 (08/28/20 0226)  Complications: No complications documented.

## 2020-09-14 NOTE — Progress Notes (Signed)
eLink Physician-Brief Progress Note Patient Name: Wrangler Penning Kastens DOB: 09-11-1965 MRN: [id number]   Date of Service  09/14/2020  HPI/Events of Note  Hypoglycemia - Blood glucose = 59.   eICU Interventions  Plan: 1. Increase D10W IV infusion to 50 mL/hour.      Intervention Category Major Interventions: Other:  Alfio Loescher Dennard Nip 09/14/2020, 4:10 AM

## 2020-09-14 NOTE — Progress Notes (Signed)
[place] for Infectious Disease   Reason for visit: Follow up on fever  Interval History: patient seen in rounds this am, late entry; WBC 16.2, Tmax 101.6, no acute events.   Day 2 ceftaroline Day 19 total antibiotics  Physical Exam: Constitutional:  Vitals:   09/14/20 1200 09/14/20 1400  BP: 109/73 (!) 95/58  Pulse: 93 92  Resp: 18 10  Temp: 100.22 F (37.9 C) 99.86 F (37.7 C)  SpO2: 99% 90%   patient is sedated Respiratory: respiratory effort on vent; CTA B Cardiovascular: tachy RR GI: soft, nd  Review of Systems: Unable to be assessed due to patient factors  Lab Results  Component Value Date   WBC 16.1 (H) 09/14/2020   HGB 9.1 (L) 09/14/2020   HCT 29.9 (L) 09/14/2020   MCV 99.7 09/14/2020   PLT 421 (H) 09/14/2020    Lab Results  Component Value Date   CREATININE 0.46 (L) 09/14/2020   BUN 15 09/14/2020   NA 142 09/14/2020   K 4.3 09/14/2020   CL 103 09/14/2020   CO2 29 09/14/2020    Lab Results  Component Value Date   ALT 55 (H) 09/14/2020   AST 35 09/14/2020   ALKPHOS 161 (H) 09/14/2020     Microbiology: Recent Results (from the past 240 hour(s))  Culture, respiratory     Status: None   Collection Time: 09/07/20  2:25 PM   Specimen: Bronchoalveolar Lavage; Respiratory  Result Value Ref Range Status   Specimen Description   Final    BRONCHIAL ALVEOLAR LAVAGE Performed at [facility] [address], [place], [place] [address]    Special Requests   Final    NONE Performed at [facility], [place] [address], [place], [place] [address]    Gram Stain   Final    ABUNDANT WBC PRESENT, PREDOMINANTLY PMN MODERATE GRAM POSITIVE COCCI IN CLUSTERS Performed at [facility], [place] [address] [place], [place] [address]    Culture FEW METHICILLIN RESISTANT STAPHYLOCOCCUS AUREUS  Final   Report Status 09/10/2020 FINAL  Final   Organism ID, Bacteria METHICILLIN RESISTANT STAPHYLOCOCCUS AUREUS  Final       Susceptibility   Methicillin resistant staphylococcus aureus - MIC*    CIPROFLOXACIN >=8 RESISTANT Resistant     ERYTHROMYCIN >=8 RESISTANT Resistant     GENTAMICIN <=0.5 SENSITIVE Sensitive     OXACILLIN >=4 RESISTANT Resistant     TETRACYCLINE <=1 SENSITIVE Sensitive     VANCOMYCIN 1 SENSITIVE Sensitive     TRIMETH/SULFA >=320 RESISTANT Resistant     CLINDAMYCIN >=8 RESISTANT Resistant     RIFAMPIN <=0.5 SENSITIVE Sensitive     Inducible Clindamycin NEGATIVE Sensitive     * FEW METHICILLIN RESISTANT STAPHYLOCOCCUS AUREUS  Culture, Respiratory w Gram Stain     Status: None (Preliminary result)   Collection Time: 09/13/20  2:55 PM   Specimen: Tracheal Aspirate; Respiratory  Result Value Ref Range Status   Specimen Description   Final    TRACHEAL ASPIRATE Performed at [place] [address], [place], [place] [address]    Special Requests   Final    NONE Performed at [facility], [place] [address], [place], [place] [address]    Gram Stain   Final    ABUNDANT WBC PRESENT, PREDOMINANTLY PMN FEW GRAM POSITIVE COCCI    Culture   Final    NO GROWTH < 12 HOURS Performed at [facility],  [address] [place], [place] [address]    Report Status PENDING  Incomplete    Impression/Plan:  1. MRSA pneumonia with empyema - s/p bronchoscopy yesterday and no new growth. He is on ceftaronline now and not much change, though early. He likely just has a large burden of disease.  No new positive cultures to need to add any antibiotic and no benefit to dual therapy.   Will continue with ceftaroline.   2. Respiratory failure - remains intubated and now s/p trach placement.  Continuing with vent and chest PT.    3.  Heart failure - no previous history but not EF 35-40%.    4.  Encephalopathy/delirium - trial of ketamine today per palliative care.  Difficult to control agitation and concern for continued withdrawal.    Will continue to  follow

## 2020-09-14 NOTE — TOC Progression Note (Signed)
Transition of Care [facility]) - Progression Note    Patient Details  Name: Joseph Ayala MRN: [id number] Date of Birth: [birth year]  Transition of Care [facility]) CM/SW Contact  Golda Acre, RN Phone Number: 09/14/2020, 8:36 AM  Clinical Narrative:    STAFF NOTE: I, Dr Lavinia Sharps have personally reviewed patient's available data, including medical history, events of note, physical examination and test results as part of my evaluation. I have discussed with resident/NP and other care providers such as pharmacist, RN and RRT.  In addition,   I personally evaluated patient and elicited key findings of     S : Date of admit  08/27/2020   with LOS  16   for today  09/13/2020   :  Joseph Ayala   is  -40% FiO2.  5 of PEEP.  On the ventilator.  Continues to be febrile but blood pressure is soft but adequate.  On propofol, Dilaudid infusion and Versed infusion.  Gets intermittently agitated.  Palliative care has recommended ketamine infusion.  Methadone has been stopped.  Chest x-ray today shows complete lung whiteout on the right side suggesting mucous plugging.  He needs bronchoscopy    O :    Blood pressure 90/61, pulse 91, temperature  (!) 100.94 F (38.3 C) , resp. rate 18, height 5\' 11"  (1.803 m), weight 100.1 kg, SpO2 97 %.       -Deeply sedated male on the ventilator.  Has edema.  Has scattered bruising synchronous with the ventilator.  Wife at the bedside.  40% FiO2 abdomen soft   plan: to unstable;e to determine at this time   Expected Discharge Plan: Home/Self Care Barriers to Discharge: Continued Medical Work up  Expected Discharge Plan and Services Expected Discharge Plan: Home/Self Care   Discharge Planning Services: CM Consult   Living arrangements for the past 2 months: Single Family Home                                       Social Determinants of Health (SDOH) Interventions    Readmission Risk Interventions No flowsheet data found.

## 2020-09-14 NOTE — Progress Notes (Signed)
Patient not tolerating current level of sedation. Dilaudid drip is at 3mg /hr and ketamine drip is at 1mg /kg/hr. PRN medications given, but patient's RASS remains +3. Patient is shaking head from side to side , kicking legs, grimacing, and reaching toward trach, but does not seem to be redirectable at this time. Propofol drip restarted after consulting eLink. Will continue to monitor in order to minimize sedation.

## 2020-09-15 ENCOUNTER — Inpatient Hospital Stay (HOSPITAL_COMMUNITY): Payer: Medicare HMO

## 2020-09-15 ENCOUNTER — Encounter (HOSPITAL_COMMUNITY): Payer: Self-pay | Admitting: Otolaryngology

## 2020-09-15 DIAGNOSIS — J15212 Pneumonia due to Methicillin resistant Staphylococcus aureus: Secondary | ICD-10-CM | POA: Diagnosis not present

## 2020-09-15 DIAGNOSIS — J9690 Respiratory failure, unspecified, unspecified whether with hypoxia or hypercapnia: Secondary | ICD-10-CM | POA: Diagnosis not present

## 2020-09-15 DIAGNOSIS — J850 Gangrene and necrosis of lung: Secondary | ICD-10-CM | POA: Diagnosis not present

## 2020-09-15 DIAGNOSIS — B9562 Methicillin resistant Staphylococcus aureus infection as the cause of diseases classified elsewhere: Secondary | ICD-10-CM | POA: Diagnosis not present

## 2020-09-15 DIAGNOSIS — J869 Pyothorax without fistula: Secondary | ICD-10-CM | POA: Diagnosis not present

## 2020-09-15 LAB — BASIC METABOLIC PANEL
Anion gap: 7 (ref 5–15)
BUN: 21 mg/dL — ABNORMAL HIGH (ref 6–20)
CO2: 29 mmol/L (ref 22–32)
Calcium: 8.6 mg/dL — ABNORMAL LOW (ref 8.9–10.3)
Chloride: 102 mmol/L (ref 98–111)
Creatinine, Ser: 0.52 mg/dL — ABNORMAL LOW (ref 0.61–1.24)
GFR, Estimated: >60 mL/min (ref >60)
Glucose, Bld: 132 mg/dL — ABNORMAL HIGH (ref 70–99)
Potassium: 4.1 mmol/L (ref 3.5–5.1)
Sodium: 138 mmol/L (ref 135–145)

## 2020-09-15 LAB — GLUCOSE, CAPILLARY
Glucose-Capillary: 78 mg/dL (ref 70–99)
Glucose-Capillary: 88 mg/dL (ref 70–99)
Glucose-Capillary: 93 mg/dL (ref 70–99)
Glucose-Capillary: 93 mg/dL (ref 70–99)
Glucose-Capillary: 94 mg/dL (ref 70–99)

## 2020-09-15 LAB — PHOSPHORUS: Phosphorus: 3.6 mg/dL (ref 2.5–4.6)

## 2020-09-15 IMAGING — DX DG CHEST 1V PORT
1 series · 1 of 1 positions shown · non-contrast
Comparison: [DATE].

CLINICAL DATA: Acute respiratory failure.  Hypoxia.

EXAM:
PORTABLE CHEST 1 VIEW

[chest ap]
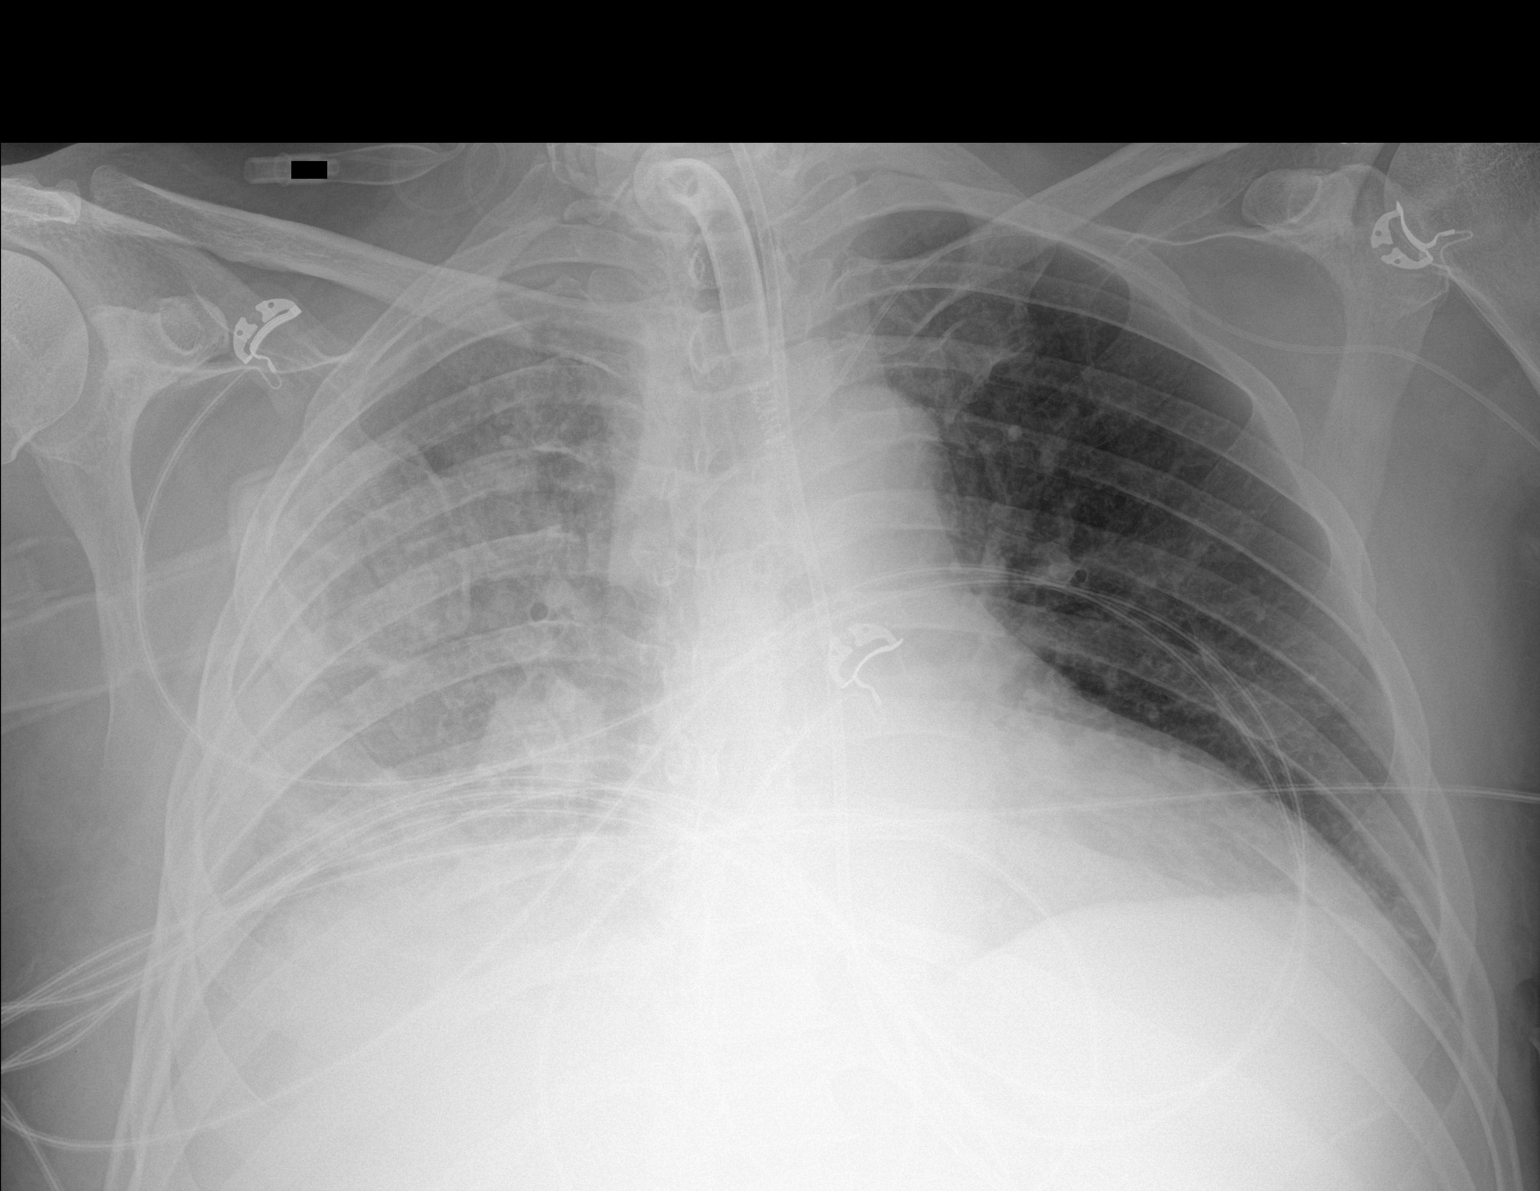

[1 of 1 positions shown; findings below may reference images not displayed]

FINDINGS: Tracheostomy tube, feeding tube, left PICC line in stable position.
Heart size stable. Diffuse right lung atelectatic changes infiltrate
noted without interim change. Improved small right pleural effusion.
No pneumothorax.
IMPRESSION: 1. Lines and tubes in stable position.
2. Diffuse right lung atelectatic changes and infiltrate noted
without interim change. Improved small right pleural effusion.

## 2020-09-15 IMAGING — DX DG ABDOMEN 1V
1 series · 1 of 1 positions shown · non-contrast
Comparison: Single-view of the abdomen [DATE].

CLINICAL DATA: Status post feeding tube placement.

EXAM:
ABDOMEN - 1 VIEW

[abdomen kub]
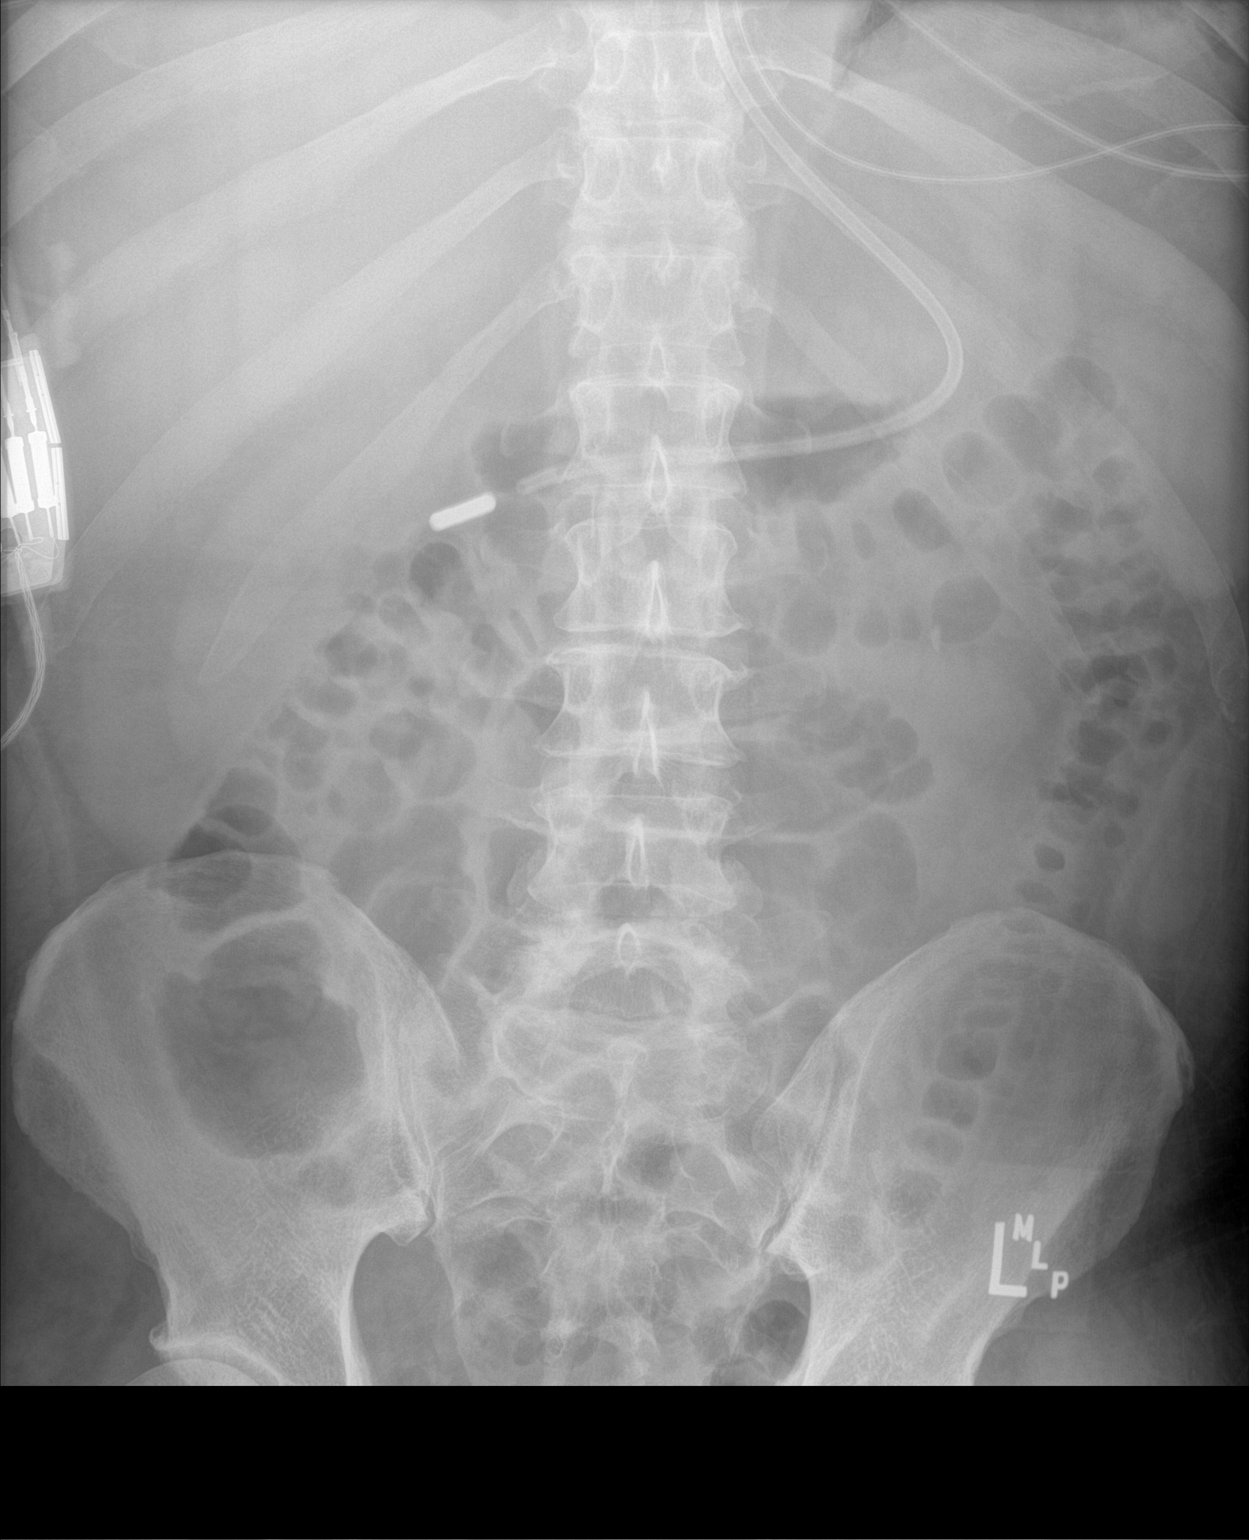

[1 of 1 positions shown; findings below may reference images not displayed]

FINDINGS: The feeding tube is in place with the tip in the distal stomach
directed toward the duodenum.
IMPRESSION: As above.

## 2020-09-15 MED ORDER — CLONAZEPAM 1 MG PO TABS
2.0000 mg | ORAL_TABLET | Freq: Three times a day (TID) | ORAL | Status: DC
  Administered 2020-09-15 – 2020-09-26 (×28): 2 mg
  Filled 2020-09-15 (×30): qty 2

## 2020-09-15 MED ORDER — DEXTROSE 10 % IV SOLN: INTRAVENOUS | Status: DC

## 2020-09-15 MED ORDER — FUROSEMIDE 10 MG/ML IJ SOLN
40.0000 mg | Freq: Once | INTRAMUSCULAR | Status: AC
  Administered 2020-09-15: 40 mg via INTRAVENOUS
  Filled 2020-09-15: qty 4

## 2020-09-15 MED ORDER — POTASSIUM CHLORIDE 20 MEQ PO PACK
20.0000 meq | PACK | Freq: Once | ORAL | Status: AC
  Administered 2020-09-15: 20 meq
  Filled 2020-09-15: qty 1

## 2020-09-15 MED ORDER — POTASSIUM CHLORIDE CRYS ER 20 MEQ PO TBCR: 20.0000 meq | EXTENDED_RELEASE_TABLET | Freq: Once | ORAL | Status: DC

## 2020-09-15 MED ORDER — SODIUM CHLORIDE 0.9 % IV SOLN
1.5000 mg/kg/h | INTRAVENOUS | Status: DC
  Administered 2020-09-15 – 2020-09-17 (×10): 1.5 mg/kg/h via INTRAVENOUS
  Filled 2020-09-15 (×21): qty 5

## 2020-09-15 NOTE — Progress Notes (Signed)
Nutrition Follow-up  DOCUMENTATION CODES:   Not applicable  INTERVENTION:  - continue Pivot 1.5 @ 60 ml/hr with 45 ml Prosource TF once/day. - free water flush, if desired, to be per CCM.  NUTRITION DIAGNOSIS:   Inadequate oral intake related to inability to eat as evidenced by NPO status. -ongoing  GOAL:   Patient will meet greater than or equal to 90% of their needs -met with TF regimen  MONITOR:   Vent status,TF tolerance,Labs,Weight trends  ASSESSMENT:   55 year old male on Suboxone for necrotizing myopathy who presents with acute hypoxemic respiratory failure secondary to community-acquired pneumonia/aspiration. PCCM initially consulted for evaluation for bronchoscopy for possible mucous plugging. Transferred to ICU after worsening respiratory failure and mental status requiring intubation on 2/21.  Significant Events: 2/19- admission for CAP 2/20- intubation; OGT placement 2/21- initial RD assessment 2/22- initiation of trickle TF 2/23- off pressors 2/24- transitioned from propofol to versed d/t rising TGs; chest tube placed 2/26- mucus plugging 2/28- repeat CT showed small residual pleural fluid and largely consolidated lung 3/2- chest tube removed 3/4- OGT removed 3/5- small bore NGT placed in R nare (gastric) 3/8- CXR showed white out in R lung; mucus plugging   Patient discussed in rounds this AM. He has not been weaning from the vent today. Plan is to transition off of propofol today.   Able to talk with RN after rounds who confirms patient is tolerating TF regimen at goal rate: Pivot 1.5 @ 60 ml/hr with 45 ml Prosource TF once/day. This regimen is providing 2200 kcal, 146 grams protein, and 1093 ml free water.   Weight has been stable since 2/28. Deep pitting edema to all extremities documented in the edema section of flow sheet.      Patient is currently intubated on ventilator support MV: 10.2 L/min Temp (24hrs), Avg:99.8 F (37.7 C), Min:98.6 F (37  C), Max:100.4 F (38 C) Propofol: 3 ml/hr (79 kcal) BP: 118/78 and MAP: 87  Labs reviewed; CBG: 94 mg/dl, BUN: 21 mg/dl, creatinine: 0.52 mg/dl, Ca: 8.6 mg/dl.  Medications reviewed; 100 mg colace BID, 1 mg folvite/day, 40 mg IV lasix x1 dose 3/10, 15 ml multivitamin/day, 20 mEq Klor-Con x1 dose 3/10, 40 mEq Klor-Con/day, 100 mg thiamine/day.   Drips; propofol @ 5 mcg/kg/min, ketamine @ 1.5 mg/kg/hr, dilaudid @ 1.5 mg/hr.  IVF; D10 @ 50 ml/hr (408 kcal/24 hours).   Diet Order:   Diet Order            Diet NPO time specified  Diet effective now                 EDUCATION NEEDS:   No education needs have been identified at this time  Skin:  Skin Assessment: Skin Integrity Issues: Skin Integrity Issues:: Stage II,Stage I Stage I: R elbow (newly documented 3/2) Stage II: bilateral buttocks (newly documented 3/2)  Last BM:  3/10 (type 7 via rectal tube)  Height:   Ht Readings from Last 1 Encounters:  09/14/20 '5\' 11"'  (1.803 m)    Weight:   Wt Readings from Last 1 Encounters:  09/12/20 100.1 kg     Estimated Nutritional Needs:  Kcal:  2215 kcal Protein:  140-155g Fluid:  >/= 2 L/day      Jarome Matin, MS, RD, LDN, CNSC Inpatient Clinical Dietitian RD pager # available in [place]  After hours/weekend pager # available in [facility]

## 2020-09-15 NOTE — Progress Notes (Signed)
Confirmed with Cardiology, TEE planned for 3/11.  NPO after MN, D10 infusion to avoid hypoglycemia.     Canary Brim, MSN, APRN, NP-C, AGACNP-BC [facility] 09/15/2020, 11:56 AM   Please see [email or web address] for pager details.   From 7A-7P if no response, please call [phone] After hours, please call ELink [phone]

## 2020-09-15 NOTE — Progress Notes (Signed)
    [facility] has been requested to perform a transesophageal echocardiogram on 3/11 for Bacteremia.  After careful review of history and examination, the risks and benefits of transesophageal echocardiogram have been explained including risks of esophageal damage, perforation (1:10,000 risk), bleeding, pharyngeal hematoma as well as other potential complications associated with conscious sedation including aspiration, arrhythmia, respiratory failure and death. Alternatives to treatment were discussed, questions were answered. This was discussed with the patient's wife Ginger Malta over the phone who expressed understanding and consented on patient's behalf as he is intubated and sedated.   Procedure to be done at the bedside with Dr. Cristal Deer, tentatively planned around 2-3pm.   Laverda Page, NP-C 09/15/2020 6:31 PM

## 2020-09-15 NOTE — Progress Notes (Signed)
[facility] for Infectious Disease   Reason for visit: Follow up on fever  Interval History: no acute events, fever curve trending down some; on ketamine and propofol off.  Trach placed yesterday.  Day 3 ceftaroline Day 20 total antibiotics  Physical Exam: Constitutional:  Vitals:   09/15/20 0827 09/15/20 0900  BP:  136/81  Pulse:  93  Resp:  17  Temp:  99.86 F (37.7 C)  SpO2: 93% 94%  he is not responsive on ketamine HENT: no thrush Respiratory: respiratory effort on vent; Diffuse rhonchi Cardiovascular: Tachy RR Skin: no rashes  Review of Systems: Unable to be assessed due to patient factors  Lab Results  Component Value Date   WBC 16.1 (H) 09/14/2020   HGB 9.1 (L) 09/14/2020   HCT 29.9 (L) 09/14/2020   MCV 99.7 09/14/2020   PLT 421 (H) 09/14/2020    Lab Results  Component Value Date   CREATININE 0.52 (L) 09/15/2020   BUN 21 (H) 09/15/2020   NA 138 09/15/2020   K 4.1 09/15/2020   CL 102 09/15/2020   CO2 29 09/15/2020    Lab Results  Component Value Date   ALT 55 (H) 09/14/2020   AST 35 09/14/2020   ALKPHOS 161 (H) 09/14/2020     Microbiology: Recent Results (from the past 240 hour(s))  Culture, respiratory     Status: None   Collection Time: 09/07/20  2:25 PM   Specimen: Bronchoalveolar Lavage; Respiratory  Result Value Ref Range Status   Specimen Description   Final    BRONCHIAL ALVEOLAR LAVAGE Performed at [facility] [address], [place], [place] [address]    Special Requests   Final    NONE Performed at [facility], [place] [address], [place], [place] [address]    Gram Stain   Final    ABUNDANT WBC PRESENT, PREDOMINANTLY PMN MODERATE GRAM POSITIVE COCCI IN CLUSTERS Performed at [facility], [place] [address], [place], [place] [address]    Culture FEW METHICILLIN RESISTANT STAPHYLOCOCCUS AUREUS  Final   Report Status 09/10/2020 FINAL  Final   Organism ID, Bacteria METHICILLIN RESISTANT  STAPHYLOCOCCUS AUREUS  Final      Susceptibility   Methicillin resistant staphylococcus aureus - MIC*    CIPROFLOXACIN >=8 RESISTANT Resistant     ERYTHROMYCIN >=8 RESISTANT Resistant     GENTAMICIN <=0.5 SENSITIVE Sensitive     OXACILLIN >=4 RESISTANT Resistant     TETRACYCLINE <=1 SENSITIVE Sensitive     VANCOMYCIN 1 SENSITIVE Sensitive     TRIMETH/SULFA >=320 RESISTANT Resistant     CLINDAMYCIN >=8 RESISTANT Resistant     RIFAMPIN <=0.5 SENSITIVE Sensitive     Inducible Clindamycin NEGATIVE Sensitive     * FEW METHICILLIN RESISTANT STAPHYLOCOCCUS AUREUS  Culture, Respiratory w Gram Stain     Status: None (Preliminary result)   Collection Time: 09/13/20  2:55 PM   Specimen: Tracheal Aspirate; Respiratory  Result Value Ref Range Status   Specimen Description   Final    TRACHEAL ASPIRATE Performed at [facility] [address] [place], [place] [address]    Special Requests   Final    NONE Performed at [facility], [place] [address] [place], [place] [address]    Gram Stain   Final    ABUNDANT WBC PRESENT, PREDOMINANTLY PMN FEW GRAM POSITIVE COCCI    Culture   Final    RARE STAPHYLOCOCCUS AUREUS SUSCEPTIBILITIES TO FOLLOW Performed at  [facility], [place] [address], [place], [place] [address]    Report Status PENDING  Incomplete    Impression/Plan:  1. MRSA pneumonia with empyema - significant cavitary pneumonia and on ceftaroline with persistent fever.  Some mild trend down of the fever.   Continue with ceftaroline  2. Respiratory failure - remains on vent and trach in pllace.  Vent management per CCM.   3.  Positive yeast in BAL - c/w previous thrush and not typical as cause of ongoing fever or pneumonia.  No indication for treatment.   4.  Encephalopathy/delirium - on ketamine and off propofol now.

## 2020-09-15 NOTE — Progress Notes (Addendum)
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 PCCM initially consulted for evaluation for bronchoscopy for possible mucous plugging. Transferred to ICU after worsening respiratory failure and mental status requiring intubation on 2/21. ICU course complicated by hypotension requiring pressors, chest tube placement for empyema s/p intrapleural lytics.   Hx of poorly explained myopathy, self medicating, pulse steroids / methotrexate.    Past Medical History:  Hypertension, necrotizing myopathy, opioid use on Suboxone  Significant Hospital Events:  2/19 Arrived to ED for cough and chest pain x 1 week 2/20 Admitted to [facility] in early am. Intubated. Bronch 2/22 Worsening respiratory status and vasopressor requirement. Agitation on vent 2/23 Improving pressor requirement 2/24 Off pressors. Right chest tube placement. Propofol changed versed as TRG increased 2/25 About 650 out of CT since insertion. Stable vent requirements. Sedation and agitation a challenge / librium added. Fever. TPA into chest tube 2/26 FOB for mucus plugging 2/27 Briefly on neo overnight / thought sedation related  2/28 Repeat CT with little residual pleural fluid, largely consolidated lung. On vent. Oral oxy added.  3/01 Ceiling of IV gtt's reduced, weaning on PSV 10/5 - weaned for 8h. Diuresis.   3/02 Sedation changed, hypotension, levophed initiated. FOB with BAL, chest tube removed.   3/04 Discontinue aline, PICC placed LUE 3/05 Increase oral oxy, wean dilaudid 3/06 Improved fever curve on linezolid, challenging sedation 3/07 Difficulties with sedation > changed back to versed,  dilaudid. Oral methadone added.  3/08 R white out on CXR / mucus plugging. Methadone stopped, re-challenge with ketamine. FOB with BAL.  3/09 Calmer on ketamine at higher dose, on propofol 20 mcg, dilaudid 6m 3/10 Propofol stopped overnight, restarted due to agitation.   Consults:  TRH PCCM  Procedures:  ETT 2/21 >> 3/9 R Radial Aline 2/20 >> 3/4  L IJ TLC 2/21 >> 3/4 R CT 2/24 >> 3/2 LUE PICC 3/4 >>  TLurline Idol(Janace Hoard 3/9 >>   Significant Diagnostic Tests:   CTA Chest 2/19 >> right sided consolidation and ground glass opacities in the right and middle lobe. Background centrilobular emphysema present. No pulmonary embolism  CT CAP 2/24 >> Interval enlargement of right pleural effusion with some loculation. Progression of right airspace disease with cavitation in right middle/lower lobe. Anasarca  CT Chest w/o 2/28 >> no large amount of residual pleural fluid present with most of the low-density abnormality in the right mid to lower chest felt to represent necrotic and consolidated RLL, RML. There is still some fluid component in the posterior pleural space superior to the tip of the indwelling chest tube  CT Chest 3/8 >> persistent area of RML, RLL airspace disease with decreasing cavitation and increasing consolidation since prior study, increased airspace disease throughout the aerated RUL, consistent with progressive PNA. Stable LLL consolidation, trace L pleural effusion   TEE 3/10 >>   Micro Data:  Blood culture 2/19 >> negative  BAL resp cx 2/20 >> MRSA BAL fungal cx 2/20 >> candida albicans BAL AFB cx 2/20 >> no AFB on smear >>  Urine strep ag 2/21 >> neg Urine legionella ag 2/21 >> neg BAL 2/26 >> MRSA  BAL 3/2 >> few MRSA   BAL 3/8 >> few  GPC >>   Antimicrobials:  Ceftriaxone 2/20 >> 2/21 Azithro 2/20 >> 2/21 Vanc 2/21 >> 3/4  Zosyn 2/21 >> 2/23 Linezolid 3/4 >> 3/8  Ceftaroline 3/8 >>   Interim History / Subjective:  Trach completed 3/9, #6 placed  Vent - 40%,  PEEP 5  Tmax  Glucose range 94-132  I/O 1.9L UOP, +131m in last 24 hours  Remains on ketamine, propofol turned off overnight but restarted due to agitation  Objective   Blood pressure 128/77, pulse 95, temperature (!) 100.4 F (38 C), resp. rate 19, height _0  (1.803 m), weight 100.1 kg, SpO2 92 %. CVP:  [8 mmHg-10 mmHg] 10 mmHg  Vent Mode: PRVC FiO2 (%):  [40 %] 40 % Set Rate:  [16 bmp] 16 bmp Vt Set:  [600 mL] 600 mL PEEP:  [5 cmH20] 5 cmH20 Plateau Pressure:  [8 cmH20-16 cmH20] 16 cmH20   Intake/Output Summary (Last 24 hours) at 09/15/2020 0827 Last data filed at 09/15/2020 [id number] per 24 hour  Intake 2246.5 ml  Output 2010 ml  Net 236.5 ml   Filed Weights   09/11/20 0425 09/12/20 0000 09/12/20 0334  Weight: 98.6 kg 100.1 kg 100.1 kg   Physical Exam: General: ill appearing adult male lying in bed in NAD on vent   HEENT: MM pink/moist, trach midline c/d/i, anicteric, pupils 316mNeuro: intermittent agitation (thrashing head side to side, lifting hips off bed, trying to turn in bed), MAE, no focal deficits CV: s1s2 RRR, no m/r/g PULM: non-labored on vent, improved breath sounds on right, clear on left, thinner secretions / less pink GI: soft, bsx4 active, small bore feeding tube in place  Extremities: warm/dry, 1-2+ BLE pitting edema, BUE dependent edema in hands  Skin: improved / resolving rash on feet   PCXR 3/10 >> images personally reviewed, trach in good position, right side airspace disease unchanged  Resolved Hospital Problem list   Elevated LFTs - likely combination of hx and congestive hepatopathy AKI Aspiration pneumonia/pneumonitis in setting of altered mental status Septic shock (off pressors as of 2/24) Cardiogenic shock   Assessment & Plan:   Acute Hypoxemic Respiratory Failure secondary to MRSA PNA with Effusion  / Necrotizing PNA Parapneumonic Effusion versus Empyema  Mucus Plugging  S/p chest tube placement 2/24, tPA 2/25, 2/26.  Chest tube  removed 3/2.  -PRVC 8cc/kg  -appreciate ENT assistance with patient care -trach care per protocol  -will likely need XLT trach given leak  -continue mucomyst, D3/5 -aggressive chest PT  -follow CXR  -will need to decide on timing of CT chest with contrast to assess for residual loculated component of fluid -have discussed CT guided chest tube with IR, would need above before proceeding  Septic +/- component Cardiogenic Shock secondary to MRSA PNA Additional sedation effect and diuresis  -abx as above  -appreciate ID assistance  -candida albicans noted in sputum, discussed with ID, suspect colonizer / not source of fevers (he also had oral thrush) -lasix 4095mV x1 with additional 20 mEq KCL  New onset heart failure with ejection fraction of 35 to 40% New onset atrial flutter/SVT secondary to sepsis Demand ischemia Thoracic Aneurysm - will need outpatient follow up  Cardiology signed off > would like call back once more stable to titrate HF medications / follow up.   -amiodarone PT  -tele monitoring  -continue full dose lovenox  -follow QTc -lasix as above   Acute Metabolic Encephalopathy / Agitated Delirium  Significant Alcohol use and Narcotic Dependence Concern  for withdrawal and sepsis -hold home suboxone -increase klonopin to 67m TID -continue seroquel 100 mg BID  -push ketamine infusion to ceiling today with goal of stopping propofol -see Palliative Care note from 3/8   Hypokalemia  -monitor, replace as indicate -continue daily KCL, additional given with diuresis   Moderate Protein Calorie Malnutrition  -TF per Nutrition  -prosource PT   Macrocytic Anemia  -trend CBC -folate, MVI, thiamine  Best practice (evaluated daily)  Diet: TF Pain/Anxiety/Delirium protocol (if indicated): PAD goal -1 to -2 VAP protocol (if indicated): Yes DVT prophylaxis: Lovenox, treatment dose GI prophylaxis: Protonix Glucose control: CBG q4h Mobility: As tolerated Disposition:  ICU  Goals of Care:  Last date of multidisciplinary goals of care discussion:  Family and staff present:  Summary of discussion:  Follow up goals of care discussion due: 3/14 Code Status: Full code.    Family: Family updated in detail 3/10.  Will update on arrival 3/11.     Critical Care Time: 353minutes   BNoe Gens MSN, APRN, NP-C, AGACNP-BC [facility] 09/15/2020, 8:27 AM   Please see [email or web address] for pager details.   From 7A-7P if no response, please call [phone] After hours, please call ELink [phone]

## 2020-09-16 ENCOUNTER — Inpatient Hospital Stay (HOSPITAL_COMMUNITY): Payer: Medicare HMO

## 2020-09-16 DIAGNOSIS — J850 Gangrene and necrosis of lung: Secondary | ICD-10-CM | POA: Diagnosis not present

## 2020-09-16 DIAGNOSIS — Q211 Atrial septal defect: Secondary | ICD-10-CM | POA: Diagnosis not present

## 2020-09-16 DIAGNOSIS — I10 Essential (primary) hypertension: Secondary | ICD-10-CM

## 2020-09-16 DIAGNOSIS — J9601 Acute respiratory failure with hypoxia: Secondary | ICD-10-CM | POA: Diagnosis not present

## 2020-09-16 DIAGNOSIS — I38 Endocarditis, valve unspecified: Secondary | ICD-10-CM

## 2020-09-16 LAB — CULTURE, RESPIRATORY W GRAM STAIN

## 2020-09-16 LAB — BASIC METABOLIC PANEL
Anion gap: 9 (ref 5–15)
BUN: 20 mg/dL (ref 6–20)
CO2: 28 mmol/L (ref 22–32)
Calcium: 8.7 mg/dL — ABNORMAL LOW (ref 8.9–10.3)
Chloride: 104 mmol/L (ref 98–111)
Creatinine, Ser: 0.45 mg/dL — ABNORMAL LOW (ref 0.61–1.24)
GFR, Estimated: >60 mL/min (ref >60)
Glucose, Bld: 88 mg/dL (ref 70–99)
Potassium: 3.9 mmol/L (ref 3.5–5.1)
Sodium: 141 mmol/L (ref 135–145)

## 2020-09-16 LAB — CBC
HCT: 27.5 % — ABNORMAL LOW (ref 39.0–52.0)
Hemoglobin: 8.3 g/dL — ABNORMAL LOW (ref 13.0–17.0)
MCH: 30.5 pg (ref 26.0–34.0)
MCHC: 30.2 g/dL (ref 30.0–36.0)
MCV: 101.1 fL — ABNORMAL HIGH (ref 80.0–100.0)
Platelets: 491 10*3/uL — ABNORMAL HIGH (ref 150–400)
RBC: 2.72 MIL/uL — ABNORMAL LOW (ref 4.22–5.81)
RDW: 15.1 % (ref 11.5–15.5)
WBC: 12.5 10*3/uL — ABNORMAL HIGH (ref 4.0–10.5)
nRBC: 0 % (ref 0.0–0.2)

## 2020-09-16 LAB — GLUCOSE, CAPILLARY
Glucose-Capillary: 48 mg/dL — ABNORMAL LOW (ref 70–99)
Glucose-Capillary: 72 mg/dL (ref 70–99)
Glucose-Capillary: 78 mg/dL (ref 70–99)
Glucose-Capillary: 81 mg/dL (ref 70–99)
Glucose-Capillary: 87 mg/dL (ref 70–99)
Glucose-Capillary: 90 mg/dL (ref 70–99)
Glucose-Capillary: 96 mg/dL (ref 70–99)

## 2020-09-16 LAB — PHOSPHORUS: Phosphorus: 3.9 mg/dL (ref 2.5–4.6)

## 2020-09-16 IMAGING — DX DG CHEST 1V PORT
1 series · 1 of 1 positions shown · non-contrast
Comparison: [DATE]

CLINICAL DATA: Hypoxia

EXAM:
PORTABLE CHEST 1 VIEW

[chest ap]
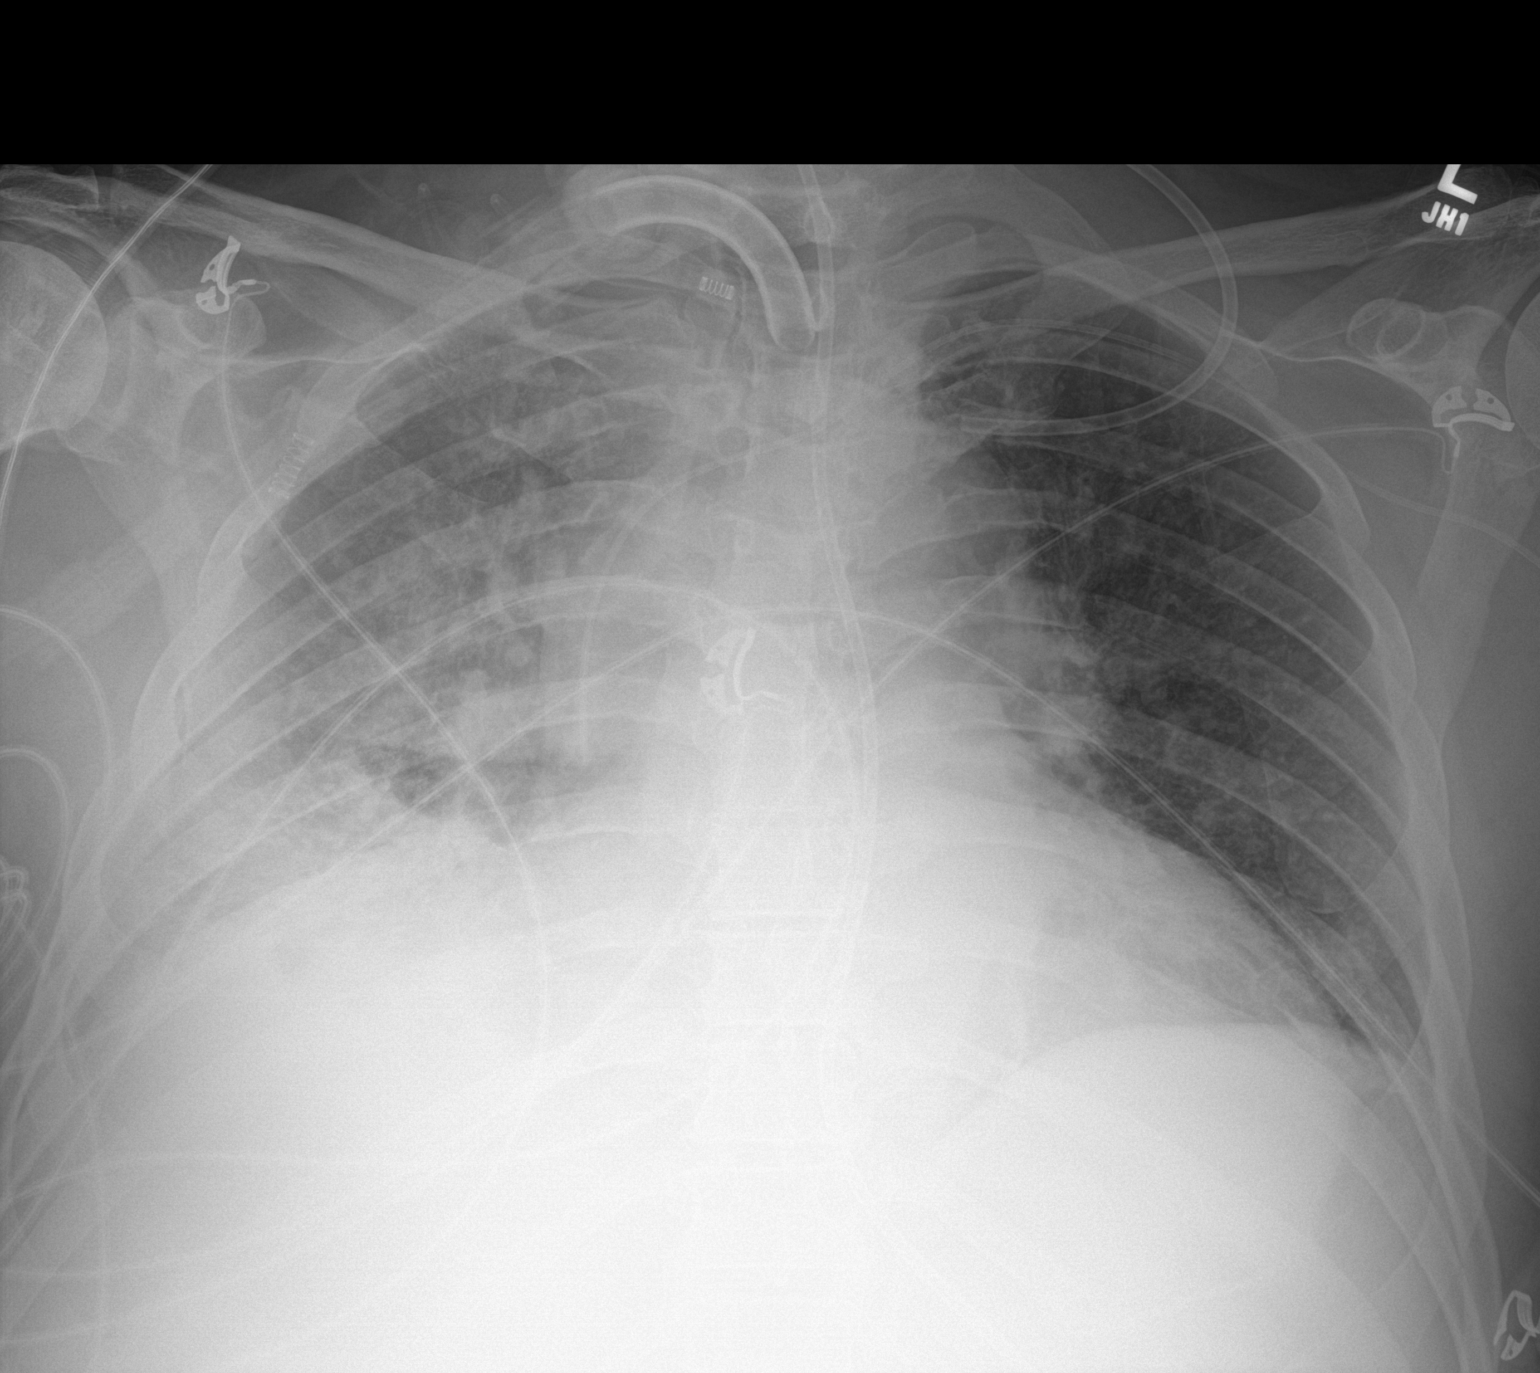

[1 of 1 positions shown; findings below may reference images not displayed]

FINDINGS: Tracheostomy catheter tip is 3.9 cm above the carina. Enteric tube
tip is below the diaphragm. Central catheter tip is at the
cavoatrial junction. No pneumothorax. There is a right pleural
effusion with areas of ill-defined airspace opacity in the right mid
and lower lung regions. Left lung is clear. Heart is enlarged with
pulmonary vascularity normal. No adenopathy. No bone lesions.
IMPRESSION: Tube and catheter positions as described without pneumothorax. Right
pleural effusion with areas of patchy airspace opacity in the right
mid and lower lung regions, likely due to a combination of
atelectasis and pneumonia. Left lung clear. Stable cardiomegaly.

## 2020-09-16 IMAGING — DX DG ABD PORTABLE 1V
1 series · 1 of 1 positions shown · non-contrast
Comparison: Radiograph yesterday.

CLINICAL DATA: Feeding tube placement.

EXAM:
PORTABLE ABDOMEN - 1 VIEW

[abdomen kub]
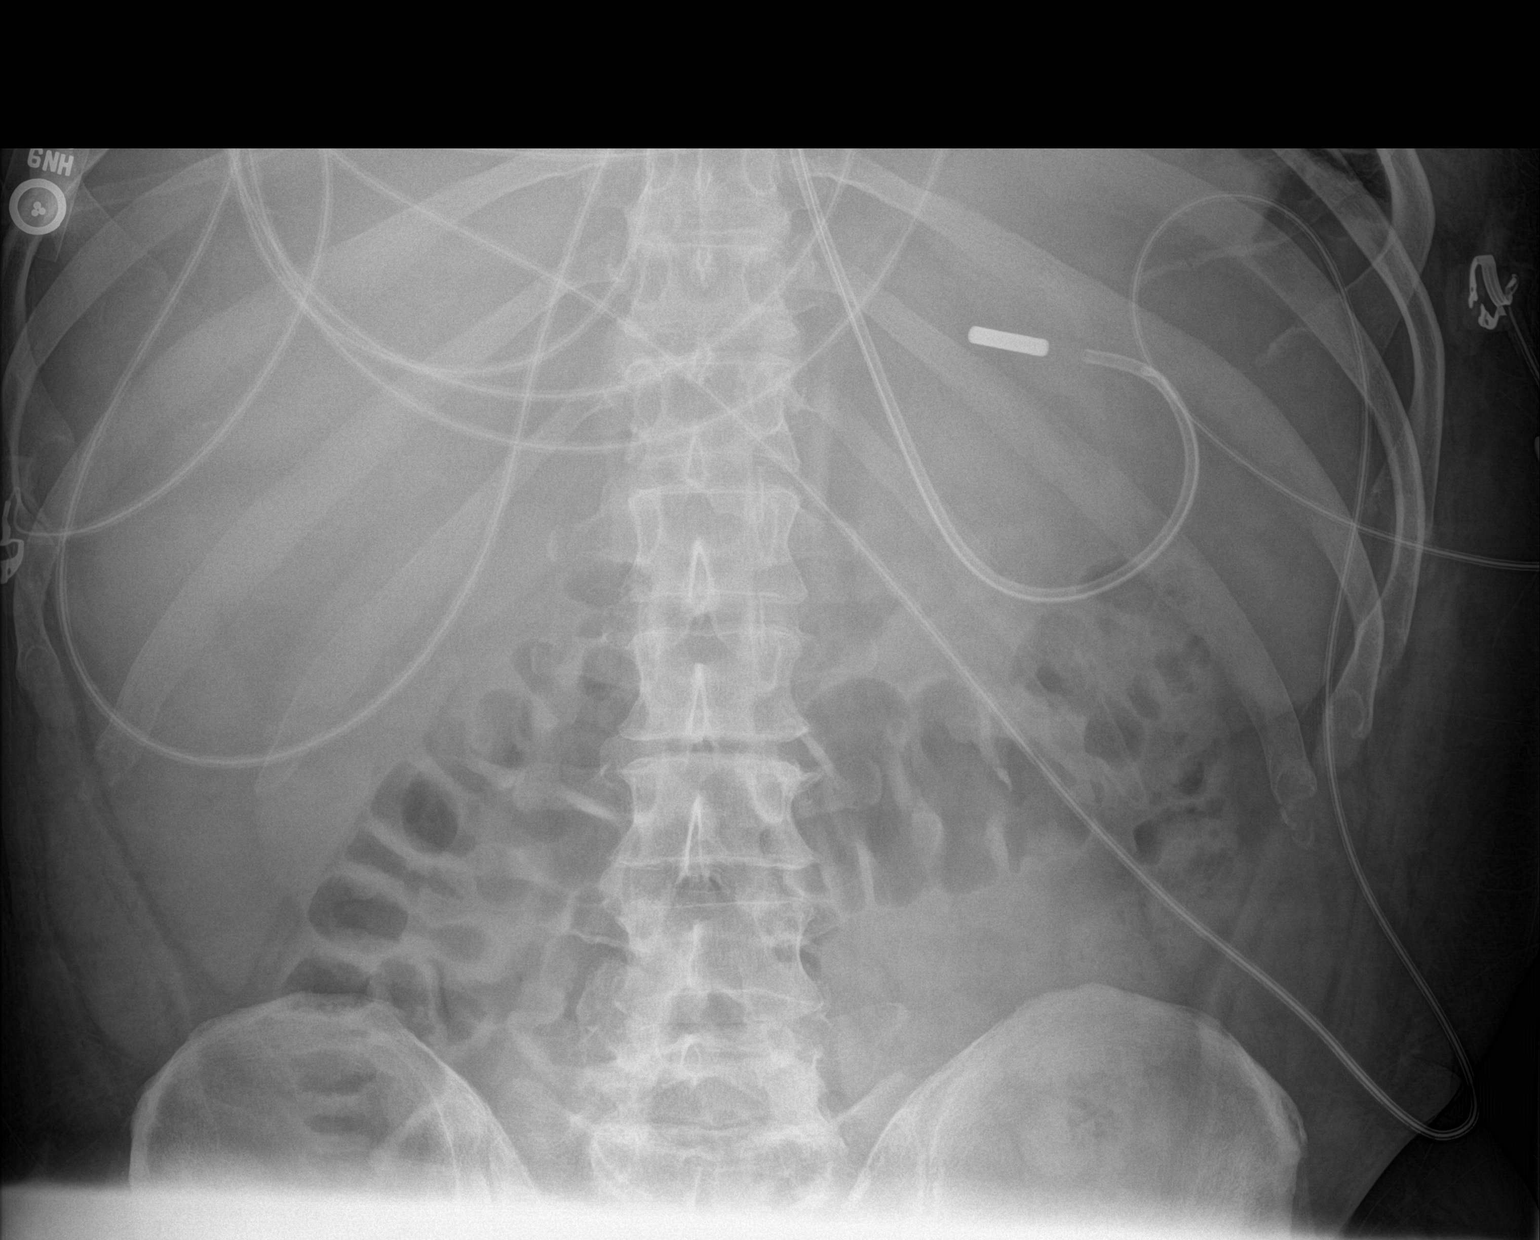

[1 of 1 positions shown; findings below may reference images not displayed]

FINDINGS: The weighted enteric tube is below the diaphragm in the stomach, tip
in the left upper quadrant in the region of the proximal stomach.
Nonobstructive bowel gas pattern in the included abdomen.
IMPRESSION: Tip of the weighted enteric tube in the proximal stomach.

## 2020-09-16 MED ORDER — PROPOFOL 500 MG/50ML IV EMUL
INTRAVENOUS | Status: AC
  Filled 2020-09-16: qty 50

## 2020-09-16 MED ORDER — SODIUM CHLORIDE 3 % IN NEBU
4.0000 mL | INHALATION_SOLUTION | Freq: Two times a day (BID) | RESPIRATORY_TRACT | Status: AC
  Administered 2020-09-16 – 2020-09-18 (×5): 4 mL via RESPIRATORY_TRACT
  Filled 2020-09-16 (×5): qty 4

## 2020-09-16 MED ORDER — HYDROMORPHONE HCL 2 MG/ML IJ SOLN
4.0000 mg | Freq: Once | INTRAMUSCULAR | Status: AC
  Administered 2020-09-16: 4 mg via INTRAVENOUS
  Filled 2020-09-16: qty 2

## 2020-09-16 MED ORDER — DEXTROSE 50 % IV SOLN
INTRAVENOUS | Status: AC
  Filled 2020-09-16: qty 50

## 2020-09-16 MED ORDER — PROPOFOL 1000 MG/100ML IV EMUL
5.0000 ug/kg/min | INTRAVENOUS | Status: DC
  Administered 2020-09-16: 5 ug/kg/min via INTRAVENOUS
  Filled 2020-09-16: qty 100

## 2020-09-16 MED ORDER — GUAIFENESIN 100 MG/5ML PO SOLN
10.0000 mL | Freq: Two times a day (BID) | ORAL | Status: DC
  Administered 2020-09-16 – 2020-09-25 (×20): 200 mg
  Filled 2020-09-16 (×4): qty 10
  Filled 2020-09-16: qty 20
  Filled 2020-09-16: qty 10
  Filled 2020-09-16 (×3): qty 20
  Filled 2020-09-16 (×4): qty 10
  Filled 2020-09-16 (×2): qty 20
  Filled 2020-09-16 (×2): qty 10
  Filled 2020-09-16: qty 20
  Filled 2020-09-16: qty 10
  Filled 2020-09-16: qty 20

## 2020-09-16 MED ORDER — DEXTROSE 50 % IV SOLN
25.0000 g | Freq: Once | INTRAVENOUS | Status: DC
  Filled 2020-09-16: qty 50

## 2020-09-16 MED ORDER — ACETAMINOPHEN 160 MG/5ML PO SOLN
650.0000 mg | Freq: Four times a day (QID) | ORAL | Status: DC | PRN
  Administered 2020-09-17 – 2020-09-25 (×6): 650 mg
  Filled 2020-09-16 (×6): qty 20.3

## 2020-09-16 NOTE — Progress Notes (Addendum)
[place] for Infectious Disease   Reason for visit: Follow up on fever  Interval History: no acute events, some weaning of the vent; WBC 12.5, Tmax 100.2 over the last 24 hours.   Day 4 ceftaroline Day 21 total antibiotics  Physical Exam: Constitutional:  Vitals:   09/16/20 1452 09/16/20 1500  BP: 99/66 108/80  Pulse: 88 90  Resp: 18 19  Temp: 100.22 F (37.9 C) 100.04 F (37.8 C)  SpO2: 100% 97%   HENT: + trach Respiratory: respiratory effort on vent; Diffuse rhonchi Cardiovascular: RRR Skin: no rashes  Review of Systems: Unable to be assessed due to patient factors  Lab Results  Component Value Date   WBC 12.5 (H) 09/16/2020   HGB 8.3 (L) 09/16/2020   HCT 27.5 (L) 09/16/2020   MCV 101.1 (H) 09/16/2020   PLT 491 (H) 09/16/2020    Lab Results  Component Value Date   CREATININE 0.45 (L) 09/16/2020   BUN 20 09/16/2020   NA 141 09/16/2020   K 3.9 09/16/2020   CL 104 09/16/2020   CO2 28 09/16/2020    Lab Results  Component Value Date   ALT 55 (H) 09/14/2020   AST 35 09/14/2020   ALKPHOS 161 (H) 09/14/2020     Microbiology: Recent Results (from the past 240 hour(s))  Culture, respiratory     Status: None   Collection Time: 09/07/20  2:25 PM   Specimen: Bronchoalveolar Lavage; Respiratory  Result Value Ref Range Status   Specimen Description   Final    BRONCHIAL ALVEOLAR LAVAGE Performed at [facility] [address], [place], [place] [address]    Special Requests   Final    NONE Performed at [facility], [place] [address] [place], [place] [address]    Gram Stain   Final    ABUNDANT WBC PRESENT, PREDOMINANTLY PMN MODERATE GRAM POSITIVE COCCI IN CLUSTERS Performed at [facility], [place]  [address] [place], [place] [address]    Culture FEW METHICILLIN RESISTANT STAPHYLOCOCCUS AUREUS  Final   Report Status 09/10/2020 FINAL  Final   Organism ID, Bacteria METHICILLIN RESISTANT STAPHYLOCOCCUS AUREUS   Final      Susceptibility   Methicillin resistant staphylococcus aureus - MIC*    CIPROFLOXACIN >=8 RESISTANT Resistant     ERYTHROMYCIN >=8 RESISTANT Resistant     GENTAMICIN <=0.5 SENSITIVE Sensitive     OXACILLIN >=4 RESISTANT Resistant     TETRACYCLINE <=1 SENSITIVE Sensitive     VANCOMYCIN 1 SENSITIVE Sensitive     TRIMETH/SULFA >=320 RESISTANT Resistant     CLINDAMYCIN >=8 RESISTANT Resistant     RIFAMPIN <=0.5 SENSITIVE Sensitive     Inducible Clindamycin NEGATIVE Sensitive     * FEW METHICILLIN RESISTANT STAPHYLOCOCCUS AUREUS  Culture, Respiratory w Gram Stain     Status: None   Collection Time: 09/13/20  2:55 PM   Specimen: Tracheal Aspirate; Respiratory  Result Value Ref Range Status   Specimen Description   Final    TRACHEAL ASPIRATE Performed at [facility] [address], [place], [place] [address]    Special Requests   Final    NONE Performed at [facility], [place] [address], [place], [place] [address]    Gram Stain   Final    ABUNDANT WBC PRESENT, PREDOMINANTLY PMN FEW GRAM POSITIVE COCCI Performed at [facility], [place] [address] [place],  [address]    Culture RARE METHICILLIN RESISTANT STAPHYLOCOCCUS AUREUS  Final   Report Status 09/16/2020 FINAL  Final   Organism ID, Bacteria METHICILLIN RESISTANT STAPHYLOCOCCUS AUREUS  Final      Susceptibility   Methicillin resistant staphylococcus aureus - MIC*    CIPROFLOXACIN >=8 RESISTANT Resistant     ERYTHROMYCIN >=8 RESISTANT Resistant     GENTAMICIN <=0.5 SENSITIVE Sensitive     OXACILLIN >=4 RESISTANT Resistant     TETRACYCLINE <=1 SENSITIVE Sensitive     VANCOMYCIN 1 SENSITIVE Sensitive     TRIMETH/SULFA >=320 RESISTANT Resistant     CLINDAMYCIN >=8 RESISTANT Resistant     RIFAMPIN <=0.5 SENSITIVE Sensitive     Inducible Clindamycin NEGATIVE Sensitive     * RARE METHICILLIN RESISTANT STAPHYLOCOCCUS AUREUS    Impression/Plan:  1. MRSA pneumonia with  empyema - he continues on ceftaroline and no new issues.  Some improvement of fever curve.  Will continue with ceftaroline.    2. acutre hypoxic respiratory failure - remains on vent but some improvement in settings.  Vent management per CCM.   3. Encephalopathy/delirium - he is more stable now on ketamine  Dr. West Bali available over the weekend if needed, otherwise Dr. Baxter Flattery will follow up on Monday

## 2020-09-16 NOTE — Procedures (Signed)
    TRANSESOPHAGEAL ECHOCARDIOGRAM   NAME:  Joseph Ayala   MRN: [id number] DOB:  October 23, 1965   ADMIT DATE: 08/27/2020  INDICATIONS: Persistent fever, concern for endocarditis  PROCEDURE:   Informed consent was obtained prior to the procedure. The risks, benefits and alternatives for the procedure were discussed and the patient's wife, Ginger Boffa, comprehended these risks.  Risks include, but are not limited to, cough, sore throat, vomiting, nausea, somnolence, esophageal and stomach trauma or perforation, bleeding, low blood pressure, aspiration, pneumonia, infection, trauma to the teeth and death.    Procedural time out performed.   During this procedure the patient is administered a total of Versed 6 mg and dilaudid 4 mg to achieve and maintain moderate conscious sedation.  Patient was still responding and agitated, so propofol drip was started and titrated. Total propofol administered is annotated in the [facility] The patient's heart rate, blood pressure, and oxygen saturation are monitored continuously during the procedure. The period of conscious sedation is 43 minutes, of which I was present face-to-face 100% of this time. Procedural sedation start time 1405, procedure end time 1448.  The transesophageal probe was inserted in the esophagus and stomach without difficulty and multiple views were obtained.   COMPLICATIONS:   There were no immediate complications.  IMPRESSIONS  1. Left ventricular ejection fraction, by estimation, is 45 to 50%. The left ventricle has mildly decreased function.  2. Right ventricular systolic function is low normal. The right ventricular size is normal.  3. No left atrial/left atrial appendage thrombus was detected.  4. The mitral valve is normal in structure. Trivial mitral valve regurgitation. No evidence of mitral stenosis.  5. The aortic valve is tricuspid. Aortic valve regurgitation is not visualized. No aortic stenosis is present.  6. Evidence  of atrial level shunting detected by color flow Doppler. There is a small patent foramen ovale with predominantly left to right shunting across the atrial septum.   Conclusion(s)/Recommendation(s): No evidence of vegetation/infective  endocarditis on this transesophageal echocardiogram. No evidence of endocarditis. Likely small PFO. Findings discussed with primary team.   Challenging study, high sedation requirements. Patient has tracheostomy, on ventilator throughout procedure. He was on ketamine and dilaudid drips prior to procedure, with additional dilaudid, versed, and propofol required for sedation. Appreciate assistance of critical care team with sedation.    Jodelle Red, MD, PhD [facility]  [address] [place], [place] [address] [phone]   3:27 PM

## 2020-09-16 NOTE — Progress Notes (Signed)
Chaplain engaged in a follow-up visit with Jomes's wife, Ginger.  Ginger shared Andrick's progress and the hopefulness she feels.  She also shared how she has been waking up really early and was going home to get some rest.  Chaplain affirmed her need for rest and how much she has on her plate.  Chaplain will continue to follow-up.    09/16/20 1600  Clinical Encounter Type  Visited With Patient  Visit Type Follow-up

## 2020-09-16 NOTE — Progress Notes (Signed)
Pt underwent a scheduled TEE echo beginning at 1405 at bedside. Dr. Cristal Deer performed the procedure with echo technician. No complications noted by this RN or MD. Vital signs were continuously monitored by RN throughout the duration of the procedure and remained stable. Pt was given 4 mg dilaudid, a total of 6 mg versed; a propofol gtt was also added to achieve necessary sedation per orders. No evidence of complications noted. This RN will continue to carefully monitor pt.

## 2020-09-16 NOTE — Progress Notes (Signed)
Pt has been having moderate amounts of pink tinged sputum from his tracheostomy. Secretions have been extremely thick and difficult to suction. This RN notified CCM, and new orders received for robitussin and additional nebulizer treatments (see MAR). RT also changed in-line suction tubing and vent circuit.   This RN will continue to carefully monitor pt.

## 2020-09-16 NOTE — Progress Notes (Signed)
  Echocardiogram Echocardiogram Transesophageal has been performed.  Joseph Ayala 09/16/2020, 3:02 PM

## 2020-09-16 NOTE — Progress Notes (Addendum)
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 PCCM initially consulted for evaluation for bronchoscopy for possible mucous plugging. Transferred to ICU after worsening respiratory failure and mental status requiring intubation on 2/21. ICU course complicated by hypotension requiring pressors, chest tube placement for empyema s/p intrapleural lytics. Trach 3/9.   Hx of poorly explained myopathy, self medicating, pulse steroids / methotrexate.    Past Medical History:  Hypertension, necrotizing myopathy, opioid use on Suboxone  Significant Hospital Events:  2/19 Arrived to ED for cough and chest pain x 1 week 2/20 Admitted to [facility] in early am. Intubated. Bronch 2/22 Worsening respiratory status and vasopressor requirement. Agitation on vent 2/23 Improving pressor requirement 2/24 Off pressors. Right chest tube placement. Propofol changed versed as TRG increased 2/25 About 650 out of CT since insertion. Stable vent requirements. Sedation and agitation a challenge / librium added. Fever. TPA into chest tube 2/26 FOB for mucus plugging 2/27 Briefly on neo overnight / thought sedation related  2/28 Repeat CT with little residual pleural fluid, largely consolidated lung. On vent. Oral oxy added.  3/01 Ceiling of IV gtt's reduced, weaning on PSV 10/5 - weaned for 8h. Diuresis.   3/02 Sedation changed, hypotension, levophed initiated. FOB with BAL, chest tube removed.   3/04 Discontinue aline, PICC placed LUE 3/05 Increase oral oxy, wean dilaudid 3/06 Improved fever curve on linezolid, challenging sedation 3/07 Difficulties with sedation > changed back  to versed, dilaudid. Oral methadone added.  3/08 R white out on CXR / mucus plugging. Methadone stopped, re-challenge with ketamine. FOB with BAL.  3/09 Calmer on ketamine at higher dose, on propofol 20 mcg, dilaudid 43m 3/10 Propofol stopped overnight, restarted due to agitation. Ketamine increased. Propofol stopped during day. 3/11 Remains on ketamine, dilaudid down to 129m/ weaning   Consults:  TRH PCCM  Procedures:  ETT 2/21 >> 3/9 R Radial Aline 2/20 >> 3/4  L IJ TLC 2/21 >> 3/4 R CT 2/24 >> 3/2 LUE PICC 3/4 >>  TrLurline IdolBJanace Hoard3/9 >>   Significant Diagnostic Tests:   CTA Chest 2/19 >> right sided consolidation and ground glass opacities in the right and middle lobe. Background centrilobular emphysema present. No pulmonary embolism  CT CAP 2/24 >> Interval enlargement of right pleural effusion with some loculation. Progression of right airspace disease with cavitation in right middle/lower lobe. Anasarca  CT Chest w/o 2/28 >> no large amount of residual pleural fluid present with most of the low-density abnormality in the right mid to lower chest felt to represent necrotic and consolidated RLL, RML. There is still some fluid component in the posterior pleural space superior to the tip of the indwelling chest tube  CT Chest 3/8 >> persistent area of RML, RLL airspace disease with decreasing cavitation and increasing consolidation since prior study, increased airspace disease throughout the aerated RUL, consistent with progressive PNA. Stable LLL consolidation, trace L pleural effusion   TEE 3/11 >>   Micro Data:  Blood culture 2/19 >> negative  BAL resp cx 2/20 >> MRSA BAL fungal cx 2/20 >> candida albicans BAL AFB cx 2/20 >> no AFB on smear >>  Urine strep ag 2/21 >> neg Urine legionella ag 2/21 >>  neg BAL 2/26 >> MRSA  BAL 3/2 >> few MRSA   BAL 3/8 >> rare staph aureus >>    Antimicrobials:  Ceftriaxone 2/20 >> 2/21 Azithro 2/20 >> 2/21 Vanc 2/21 >> 3/4  Zosyn 2/21 >>  2/23 Linezolid 3/4 >> 3/8  Ceftaroline 3/8 >>   Interim History / Subjective:  Tmax 100.2  RN reported hypoglycemia from hand > rechecked from central line 81  I/O 5.2L UOP, 368m stool, +1L in last 24 hours  TEE pending 3/11 pm  No acute events per RN   Objective   Blood pressure (!) 190/115, pulse 94, temperature 100.22 F (37.9 C), resp. rate (!) 23, height _0  (1.803 m), weight 95.6 kg, SpO2 98 %. CVP:  [9 mmHg-10 mmHg] 9 mmHg  Vent Mode: PSV FiO2 (%):  [40 %] 40 % Set Rate:  [16 bmp] 16 bmp Vt Set:  [600 mL] 600 mL PEEP:  [5 cmH20] 5 cmH20 Pressure Support:  [12 cmH20] 12 cmH20 Plateau Pressure:  [15 cmH20-21 cmH20] 16 cmH20   Intake/Output Summary (Last 24 hours) at 09/16/2020 0829 Last data filed at 09/16/2020 [id number] per 24 hour  Intake 6364.49 ml  Output 5575 ml  Net 789.49 ml   Filed Weights   09/12/20 0000 09/12/20 0334 09/16/20 0500  Weight: 100.1 kg 100.1 kg 95.6 kg   Physical Exam: General: chronically ill appearing male lying in bed in NAD HEENT: MM pink/moist, anicteric, trach midline c/d/i Neuro: awakens to voice, makes eye contact / appears more clear, follows commands, MAE CV: s1s2 RRR, no m/r/g PULM: non-labored on vent, PSV wean, lungs bilaterally coarse, bloody secretions from ETT  GI: soft, bsx4 active  Extremities: warm/dry, 1-2+ BLE pitting edema  Skin: multiple abrasions on knees/elbows from agitated delirium   PCXR 3/11 >>   Resolved Hospital Problem list   Elevated LFTs - likely combination of hx and congestive hepatopathy AKI Aspiration pneumonia/pneumonitis in setting of altered mental status Septic shock (off pressors as of 2/24) Cardiogenic shock   Assessment & Plan:   Acute Hypoxemic Respiratory Failure secondary to MRSA PNA with Effusion  / Necrotizing PNA Parapneumonic Effusion versus Empyema  Mucus Plugging  S/p chest tube placement 2/24, tPA 2/25, 2/26.  Chest tube removed 3/2.  -PRVC 8cc/kg  -daily SBT / WUA as  tolerated  -trach care per protocol  -appreciate ENT assistance with patient care  -will likely need XLT distal trach  -continue mucomyst D4/5 -chest PT Q4  -follow intermittent CXR  -hold further CT chest imaging given improvement in CXR  Septic +/- component Cardiogenic Shock secondary to MRSA PNA Additional sedation effect and diuresis.  Suspect candida in sputum colonizer -abx as above -await TEE findings -hold lasix 3/11, reassess 3/12 (has been getting QOD diuresis)  New onset heart failure with ejection fraction of 35 to 40% New onset atrial flutter/SVT secondary to sepsis Demand ischemia Thoracic Aneurysm - will need outpatient follow up  Cardiology signed off > would like call back once more stable to titrate HF medications / follow up.   -continue amiodarone PT  -tele monitoring  -full dose lovenox  -follow QTc, 4354mon 3/9/98Acute Metabolic Encephalopathy / Agitated Delirium  Significant Alcohol use and Narcotic Dependence Concern for withdrawal and sepsis -significant improvement on ketamine, would increase by 0.2579mg/hr to ceiling of 2mg88m/hr as needed for sedation  -hold home suboxone  -wean dilaudid to off, ceiling of gtt decreased to 2mg/50m3/11 -continue seroquel 100 mg BID  -continue klonopin  60m TID (last increased 3/10) -plan is to wean oral agents once off dilaudid  -see Palliative Care note from 3/8   Hypokalemia  -continue daily KCL -monitor, additional KCL as indicated with diuresis   Moderate Protein Calorie Malnutrition  -TF per Nutrition  -prosource PT   Macrocytic Anemia  -follow CBC trend  -folate, MVI, thiamine  Best practice (evaluated daily)  Diet: TF Pain/Anxiety/Delirium protocol (if indicated): PAD goal 0 to -1 VAP protocol (if indicated): Yes DVT prophylaxis: Lovenox, treatment dose GI prophylaxis: Protonix Glucose control: CBG q4h Mobility: As tolerated Disposition: ICU  Goals of Care:  Last date of multidisciplinary  goals of care discussion:  Family and staff present:  Summary of discussion:  Follow up goals of care discussion due: 3/14 Code Status: Full code.    Family: Family updated in detail 3/11.  Will update on arrival 3/12.     Critical Care Time: 346minutes   BNoe Gens MSN, APRN, NP-C, AGACNP-BC [facility] 09/16/2020, 8:29 AM   Please see [email or web address] for pager details.   From 7A-7P if no response, please call [phone] After hours, please call ELink [phone]

## 2020-09-16 NOTE — Anesthesia Postprocedure Evaluation (Signed)
Anesthesia Post Note  Patient: Joseph Ayala  Procedure(s) Performed: TRACHEOSTOMY (N/A Neck)     Patient location during evaluation: ICU Anesthesia Type: General Level of consciousness: sedated and patient remains intubated per anesthesia plan Pain management: pain level controlled Vital Signs Assessment: post-procedure vital signs reviewed and stable Respiratory status: patient on ventilator - see flowsheet for VS (ventilated via trach) Cardiovascular status: stable Postop Assessment: no apparent nausea or vomiting Anesthetic complications: no Comments: Pt remains vent dependent/unable to wean despite trach. Remains sedated. ECHO today    No complications documented.  Last Vitals:  Vitals:   09/16/20 1452 09/16/20 1500  BP: 99/66 108/80  Pulse: 88 90  Resp: 18 19  Temp: 37.9 C 37.8 C  SpO2: 100% 97%    Last Pain:  Vitals:   09/16/20 1200  TempSrc: Bladder  PainSc:                  Evelean Bigler,E. Talma Aguillard

## 2020-09-17 ENCOUNTER — Inpatient Hospital Stay (HOSPITAL_COMMUNITY): Payer: Medicare HMO

## 2020-09-17 DIAGNOSIS — J15212 Pneumonia due to Methicillin resistant Staphylococcus aureus: Secondary | ICD-10-CM | POA: Diagnosis not present

## 2020-09-17 DIAGNOSIS — G9341 Metabolic encephalopathy: Secondary | ICD-10-CM | POA: Diagnosis not present

## 2020-09-17 DIAGNOSIS — J9601 Acute respiratory failure with hypoxia: Secondary | ICD-10-CM | POA: Diagnosis not present

## 2020-09-17 DIAGNOSIS — J850 Gangrene and necrosis of lung: Secondary | ICD-10-CM | POA: Diagnosis not present

## 2020-09-17 DIAGNOSIS — G894 Chronic pain syndrome: Secondary | ICD-10-CM

## 2020-09-17 LAB — CBC
HCT: 27.4 % — ABNORMAL LOW (ref 39.0–52.0)
Hemoglobin: 8.5 g/dL — ABNORMAL LOW (ref 13.0–17.0)
MCH: 30.1 pg (ref 26.0–34.0)
MCHC: 31 g/dL (ref 30.0–36.0)
MCV: 97.2 fL (ref 80.0–100.0)
Platelets: 425 10*3/uL — ABNORMAL HIGH (ref 150–400)
RBC: 2.82 MIL/uL — ABNORMAL LOW (ref 4.22–5.81)
RDW: 15.4 % (ref 11.5–15.5)
WBC: 13.2 10*3/uL — ABNORMAL HIGH (ref 4.0–10.5)
nRBC: 0 % (ref 0.0–0.2)

## 2020-09-17 LAB — BASIC METABOLIC PANEL
Anion gap: 8 (ref 5–15)
BUN: 17 mg/dL (ref 6–20)
CO2: 28 mmol/L (ref 22–32)
Calcium: 8.7 mg/dL — ABNORMAL LOW (ref 8.9–10.3)
Chloride: 102 mmol/L (ref 98–111)
Creatinine, Ser: 0.49 mg/dL — ABNORMAL LOW (ref 0.61–1.24)
GFR, Estimated: >60 mL/min (ref >60)
Glucose, Bld: 115 mg/dL — ABNORMAL HIGH (ref 70–99)
Potassium: 3.8 mmol/L (ref 3.5–5.1)
Sodium: 138 mmol/L (ref 135–145)

## 2020-09-17 LAB — GLUCOSE, CAPILLARY
Glucose-Capillary: 105 mg/dL — ABNORMAL HIGH (ref 70–99)
Glucose-Capillary: 114 mg/dL — ABNORMAL HIGH (ref 70–99)
Glucose-Capillary: 96 mg/dL (ref 70–99)
Glucose-Capillary: 100 mg/dL — ABNORMAL HIGH (ref 70–99)
Glucose-Capillary: 76 mg/dL (ref 70–99)
Glucose-Capillary: 80 mg/dL (ref 70–99)

## 2020-09-17 IMAGING — DX DG CHEST 1V PORT
1 series · 1 of 1 positions shown · non-contrast
Comparison: chest x-ray [DATE]

CLINICAL DATA: Acute respiratory failure with hypoxia

EXAM:
PORTABLE CHEST 1 VIEW

[chest ap]
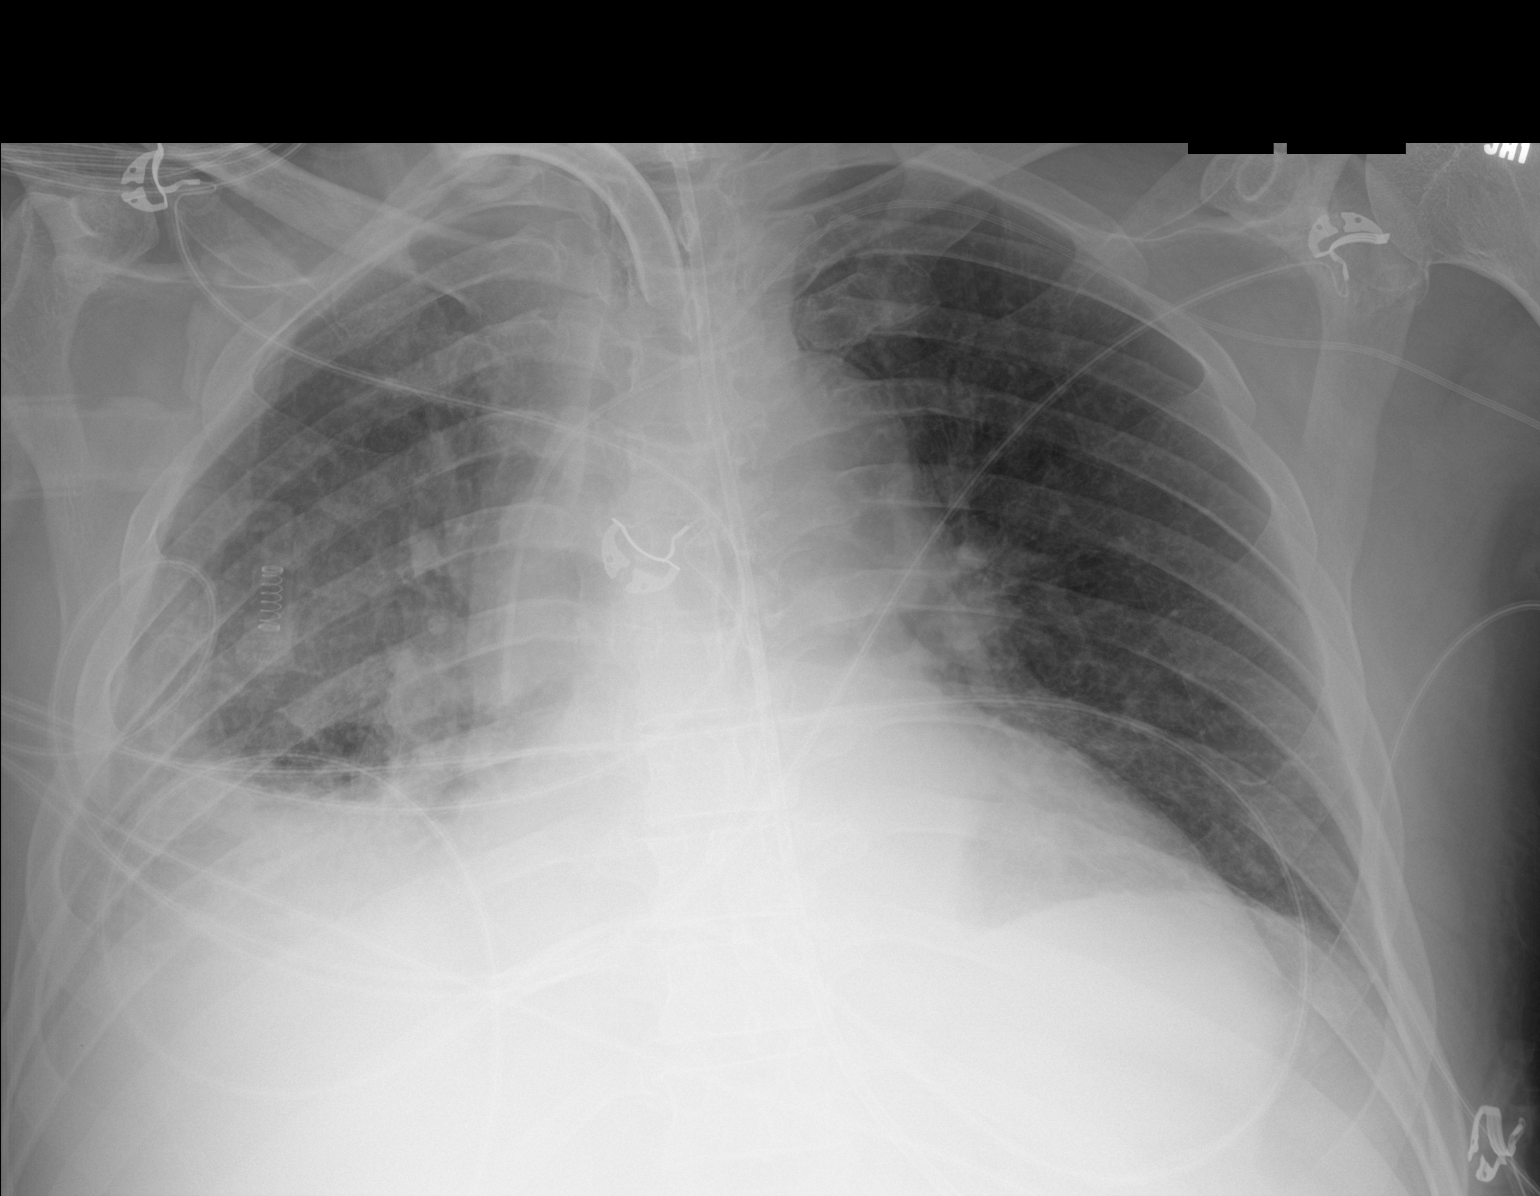

[1 of 1 positions shown; findings below may reference images not displayed]

FINDINGS: Tracheostomy, enteric tube, left subclavian catheter in stable
position.

The heart size and mediastinal contours are unchanged.

Persistent small right pleural effusion with right lung patchy
airspace opacities. Left lung is clear. No definite left pleural
effusion. No pneumothorax.

No acute osseous abnormality.
IMPRESSION: 1. Persistent small right pleural effusion with right lung patchy
airspace opacities.
2. Lines and tubes in stable position.

## 2020-09-17 MED ORDER — HYDROCERIN EX CREA
TOPICAL_CREAM | Freq: Every day | CUTANEOUS | Status: DC
  Administered 2020-09-18 – 2020-09-19 (×2): 1 via TOPICAL
  Filled 2020-09-17 (×2): qty 113

## 2020-09-17 MED ORDER — HYDRALAZINE HCL 20 MG/ML IJ SOLN
10.0000 mg | INTRAMUSCULAR | Status: DC | PRN
  Administered 2020-09-17 – 2020-09-18 (×2): 10 mg via INTRAVENOUS
  Filled 2020-09-17 (×2): qty 1

## 2020-09-17 MED ORDER — CLONIDINE HCL 0.1 MG PO TABS
0.1000 mg | ORAL_TABLET | Freq: Three times a day (TID) | ORAL | Status: DC
  Administered 2020-09-17: 0.1 mg
  Filled 2020-09-17: qty 1

## 2020-09-17 MED ORDER — HYDROMORPHONE HCL 2 MG/ML IJ SOLN
2.0000 mg | INTRAMUSCULAR | Status: DC | PRN
  Administered 2020-09-19 – 2020-09-22 (×4): 2 mg via INTRAVENOUS
  Filled 2020-09-17 (×4): qty 1

## 2020-09-17 MED ORDER — SODIUM CHLORIDE 0.9 % IV SOLN: INTRAVENOUS | Status: DC

## 2020-09-17 MED ORDER — METOCLOPRAMIDE HCL 5 MG/ML IJ SOLN
5.0000 mg | Freq: Four times a day (QID) | INTRAMUSCULAR | Status: DC
  Administered 2020-09-17 – 2020-09-20 (×12): 5 mg via INTRAVENOUS
  Filled 2020-09-17 (×12): qty 2

## 2020-09-17 MED ORDER — SODIUM CHLORIDE 0.9 % IV SOLN
1.2000 mg/kg/h | INTRAVENOUS | Status: DC
  Administered 2020-09-17 – 2020-09-19 (×6): 1.5 mg/kg/h via INTRAVENOUS
  Administered 2020-09-19 – 2020-09-20 (×2): 1.2 mg/kg/h via INTRAVENOUS
  Filled 2020-09-17 (×9): qty 12.5

## 2020-09-17 MED ORDER — CLONIDINE HCL 0.1 MG PO TABS
0.2000 mg | ORAL_TABLET | Freq: Three times a day (TID) | ORAL | Status: DC
  Administered 2020-09-17 – 2020-09-24 (×22): 0.2 mg
  Filled 2020-09-17 (×23): qty 2

## 2020-09-17 NOTE — Progress Notes (Signed)
eLink Physician-Brief Progress Note Patient Name: Joseph Ayala DOB: 06/02/1966 MRN: [id number]   Date of Service  09/17/2020  HPI/Events of Note  SBP 170s.  eICU Interventions  Ordered hydralazine 10mg  IV Q4H PRN SBP > 170     Intervention Category Intermediate Interventions: Hypertension - evaluation and management  Terilyn Sano 09/17/2020, 8:24 PM

## 2020-09-17 NOTE — Progress Notes (Addendum)
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 PCCM initially consulted for evaluation for bronchoscopy for possible mucous plugging. Transferred to ICU after worsening respiratory failure and mental status requiring intubation on 2/21. ICU course complicated by hypotension requiring pressors, chest tube placement for empyema s/p intrapleural lytics. Trach 3/9.   Hx of poorly explained myopathy, self medicating, pulse steroids / methotrexate.    Past Medical History:  Hypertension, necrotizing myopathy, opioid use on Suboxone  Significant Hospital Events:  2/19 Arrived to ED for cough and chest pain x 1 week 2/20 Admitted to [facility] in early am. Intubated. Bronch 2/22 Worsening respiratory status and vasopressor requirement. Agitation on vent 2/23 Improving pressor requirement 2/24 Off pressors. Right chest tube placement. Propofol changed versed as TRG increased 2/25 About 650 out of CT since insertion. Stable vent requirements. Sedation and agitation a challenge / librium added. Fever. TPA into chest tube 2/26 FOB for mucus plugging 2/27 Briefly on neo overnight / thought sedation related  2/28 Repeat CT with little residual pleural fluid, largely consolidated lung. On vent. Oral oxy added.  3/01 Ceiling of IV gtt's reduced, weaning on PSV 10/5 - weaned for 8h. Diuresis.   3/02 Sedation changed, hypotension, levophed initiated. FOB with BAL, chest tube removed.   3/04 Discontinue aline, PICC placed LUE 3/05 Increase oral oxy, wean dilaudid 3/06 Improved fever curve on linezolid, challenging sedation 3/07 Difficulties with sedation > changed back  to versed, dilaudid. Oral methadone added.  3/08 R white out on CXR / mucus plugging. Methadone stopped, re-challenge with ketamine. FOB with BAL.  3/09 Calmer on ketamine at higher dose, on propofol 20 mcg, dilaudid 7m 3/10 Propofol stopped overnight, restarted due to agitation. Ketamine increased. Propofol stopped during day. 3/11 Remains on ketamine, dilaudid down to 118m/ weaning  3/12 added clonidine to help with dilaudid wean completely off   Consults:  TRH PCCM ID  Procedures:  ETT 2/21 >> 3/9 R Radial Aline 2/20 >> 3/4  L IJ TLC 2/21 >> 3/4 R CT 2/24 >> 3/2 LUE PICC 3/4 >>  TrLurline IdolBJanace Hoard3/9 >>   Significant Diagnostic Tests:   CTA Chest 2/19 >> right sided consolidation and ground glass opacities in the right and middle lobe. Background centrilobular emphysema present. No pulmonary embolism  CT CAP 2/24 >> Interval enlargement of right pleural effusion with some loculation. Progression of right airspace disease with cavitation in right middle/lower lobe. Anasarca  CT Chest w/o 2/28 >> no large amount of residual pleural fluid present with most of the low-density abnormality in the right mid to lower chest felt to represent necrotic and consolidated RLL, RML. There is still some fluid component in the posterior pleural space superior to the tip of the indwelling chest tube  CT Chest 3/8 >> persistent area of RML, RLL airspace disease with decreasing cavitation and increasing consolidation since prior study, increased airspace disease throughout the aerated RUL, consistent with progressive PNA. Stable LLL consolidation, trace L pleural effusion   TEE 3/11 >> EF 45-50%  No veg/ L to R ASD   Micro Data:  Blood culture 2/19 >> negative  BAL resp cx 2/20 >> MRSA BAL fungal cx 2/20 >> candida albicans  BAL AFB cx 2/20 >> no AFB on smear >>  Urine strep ag 2/21 >> neg Urine legionella ag 2/21 >> neg BAL 2/26 >> MRSA  BAL 3/2 >> few MRSA   BAL 3/8 >> rare staph aureus >>   Rare MRSA   Antimicrobials:  Ceftriaxone 2/20 >> 2/21 Azithro 2/20 >> 2/21 Vanc 2/21 >> 3/4  Zosyn 2/21 >> 2/23 Linezolid 3/4 >> 3/8  Ceftaroline 3/8 >>   Scheduled Meds: . acetylcysteine  4 mL Nebulization BID  . amiodarone  200 mg Per Tube Daily  . chlorhexidine gluconate (MEDLINE KIT)  15 mL Mouth Rinse BID  . Chlorhexidine Gluconate Cloth  6 each Topical Daily  . clonazePAM  2 mg Per Tube Q8H  . cloNIDine  0.1 mg Per Tube TID  . dextrose  25 g Intravenous Once  . docusate  100 mg Per Tube BID  . enoxaparin (LOVENOX) injection  100 mg Subcutaneous Q12H  . feeding supplement (PIVOT 1.5 CAL)  1,000 mL Per Tube Q24H  . feeding supplement (PROSource TF)  45 mL Per Tube Daily  . folic acid  1 mg Per Tube Daily  . guaiFENesin  10 mL Per Tube BID  . mouth rinse  15 mL Mouth Rinse 10 times per day  . multivitamin  15 mL Per Tube Daily  . nystatin   Topical BID  . pantoprazole sodium  40 mg Per Tube Daily  . potassium chloride  40 mEq Per Tube Daily  . QUEtiapine  100 mg Per Tube BID  . sodium chloride flush  10-40 mL Intracatheter Q12H  . sodium chloride HYPERTONIC  4 mL Nebulization BID  . thiamine  100 mg Per Tube Daily   Continuous Infusions: . sodium chloride 10 mL/hr at 09/16/20 0036  . ceFTAROline (TEFLARO) IV Stopped (09/17/20 8110)  . dextrose Stopped (09/16/20 2100)  . HYDROmorphone Stopped (09/17/20 0645)  . ketamine (KETALAR) Adult IV Infusion 1.5 mg/kg/hr (09/17/20 0650)   PRN Meds:.acetaminophen, albuterol, HYDROmorphone, ibuprofen, lip balm, midazolam, polyethylene glycol, sodium chloride flush   Interim History / Subjective:  Much better per nursing on Ketamine overnight / bp up a bit with dilaudid taper   Objective   Blood pressure (!) 147/95, pulse 99, temperature 98.96 F (37.2 C), resp. rate 20, height _0  (1.803 m), weight 93.7 kg, SpO2 100 %.    Vent Mode: CPAP;PSV FiO2 (%):  [40 %-100 %] 40 % Set Rate:  [16 bmp] 16 bmp Vt Set:  [600 mL] 600  mL PEEP:  [5 cmH20] 5 cmH20 Pressure Support:  [5 cmH20-10 cmH20] 5 cmH20 Plateau Pressure:  [14 cmH20-19 cmH20] 14 cmH20   Intake/Output Summary (Last 24 hours) at 09/17/2020 1020 Last data filed at 09/17/2020 0800 Gross per 24 hour  Intake 2935.58 ml  Output 3775 ml  Net -839.42 ml   Filed Weights   09/12/20 0334 09/16/20 0500 09/17/20 0500  Weight: 100.1 kg 95.6 kg 93.7 kg   Physical Exam: Tmax  100.8 No jvd Oropharynx clear,  mucosa nl Neck supple Lungs with a few scattered exp > insp rhonchi bilaterally RRR no s3 or or sign murmur Abd obese with nl excursion  Extr warm with no edema or clubbing noted     I personally reviewed images and agree with radiology impression as follows:  CXR:   3/12 1. Persistent small right pleural effusion with right lung patchy airspace opacities. 2. Lines and tubes in stable position.    Resolved Hospital Problem list  Elevated LFTs - likely combination of hx and congestive hepatopathy AKI Aspiration pneumonia/pneumonitis in setting of altered mental status Septic shock (off pressors as of 2/24) Cardiogenic shock   Assessment & Plan:   Acute Hypoxemic Respiratory Failure secondary to MRSA PNA with Effusion  / Necrotizing PNA Parapneumonic Effusion versus Empyema  Mucus Plugging  S/p chest tube placement 2/24, tPA 2/25, 2/26.  Chest tube removed 3/2.  -trach care per protocol  -will likely need XLT distal trach  -chest PT Q4  -follow intermittent CXR    Septic +/- component Cardiogenic Shock secondary to MRSA PNA -abx as above - bp on high side off dilaudid despite neg I/0 last 24 h > clonidine, hold diuretics   New onset heart failure with ejection fraction of 35 to 40% New onset atrial flutter/SVT secondary to sepsis Demand ischemia Thoracic Aneurysm - will need outpatient follow up  Cardiology signed off > would like call back once more stable to titrate HF medications / follow up.   -continue amiodarone PT  -tele  monitoring  -full dose lovenox  -follow QTc daily , 475m on 34/75  Acute Metabolic Encephalopathy / Agitated Delirium  Significant Alcohol use and Narcotic Dependence Concern for withdrawal and sepsis -significant improvement on ketamine, ceiling of 278mkg/hr as needed for sedation  -hold home suboxone  -wean dilaudid to off > started clonidine am 3/12  -continue seroquel 100 mg BID  -continue klonopin 35m47mID (last increased 3/10) -plan is to wean oral agents once off dilaudid  -see Palliative Care note from 3/8   Hypokalemia  -continue daily KCL -monitor, additional KCL as indicated with diuresis   Moderate Protein Calorie Malnutrition  -TF per Nutrition  -prosource PT   Macrocytic Anemia    Lab Results  Component Value Date   HGB 8.5 (L) 09/17/2020   HGB 8.3 (L) 09/16/2020   HGB 9.1 (L) 09/14/2020   HGB 15.6 04/11/2020   HGB 15.8 09/01/2019   HGB 16.1 06/01/2019  -follow CBC trend  -folate, MVI, thiamine  Best practice (evaluated daily)  Diet: TF Pain/Anxiety/Delirium protocol (if indicated): PAD goal 0 to -1 VAP protocol (if indicated): Yes DVT prophylaxis: Lovenox, treatment dose GI prophylaxis: Protonix Glucose control: CBG q4h Mobility: As tolerated Disposition: ICU  Goals of Care:  Last date of multidisciplinary goals of care discussion:  Family and staff present:  Summary of discussion:  Follow up goals of care discussion due: 3/14 Code Status: Full code.    Family: Family updated in detail 3/11.     MicChristinia GullyD Pulmonary and [phone] 7:00 pm call Elink  [phone]

## 2020-09-17 NOTE — Progress Notes (Signed)
40 ml of IV dilaudid wasted in sharps. Witnessed by Henreitta Cea, RN

## 2020-09-18 DIAGNOSIS — G9341 Metabolic encephalopathy: Secondary | ICD-10-CM

## 2020-09-18 DIAGNOSIS — J9601 Acute respiratory failure with hypoxia: Secondary | ICD-10-CM | POA: Diagnosis not present

## 2020-09-18 DIAGNOSIS — J850 Gangrene and necrosis of lung: Secondary | ICD-10-CM | POA: Diagnosis not present

## 2020-09-18 LAB — GLUCOSE, CAPILLARY
Glucose-Capillary: 96 mg/dL (ref 70–99)
Glucose-Capillary: 73 mg/dL (ref 70–99)
Glucose-Capillary: 89 mg/dL (ref 70–99)
Glucose-Capillary: 84 mg/dL (ref 70–99)
Glucose-Capillary: 92 mg/dL (ref 70–99)
Glucose-Capillary: 105 mg/dL — ABNORMAL HIGH (ref 70–99)

## 2020-09-18 LAB — CREATININE, SERUM
Creatinine, Ser: 0.37 mg/dL — ABNORMAL LOW (ref 0.61–1.24)
GFR, Estimated: >60 mL/min (ref >60)

## 2020-09-18 MED ORDER — METOPROLOL TARTRATE 25 MG/10 ML ORAL SUSPENSION
50.0000 mg | Freq: Two times a day (BID) | ORAL | Status: DC
  Administered 2020-09-18 – 2020-09-23 (×12): 50 mg
  Filled 2020-09-18 (×13): qty 20

## 2020-09-18 MED ORDER — HYDRALAZINE HCL 20 MG/ML IJ SOLN: 10.0000 mg | INTRAMUSCULAR | Status: DC | PRN

## 2020-09-18 NOTE — Progress Notes (Signed)
NAME:  Joseph Ayala, MRN:  [id number], DOB:  Jan 25, 1966, LOS: 21 ADMISSION DATE:  08/27/2020, CONSULTATION DATE:  08/28/20 REFERRING MD:  Darliss Cheney, MD CHIEF COMPLAINT:  Acute hypoxemic respiratory failure  Brief History:  55 year old male on Suboxone for necrotizing myopathy admitted 2/19 with acute hypoxemic respiratory failure secondary to MRSA community-acquired pneumonia / aspiration. PCCM initially consulted for evaluation for bronchoscopy for possible mucous plugging. Transferred to ICU after worsening respiratory failure and mental status requiring intubation on 2/21. ICU course complicated by hypotension requiring pressors, chest tube placement for empyema s/p intrapleural lytics. Trach 3/9.   Hx of poorly explained myopathy, self medicating, pulse steroids / methotrexate.    Past Medical History:  Hypertension, necrotizing myopathy, opioid use on Suboxone  Significant Hospital Events:  2/19 Arrived to ED for cough and chest pain x 1 week 2/20 Admitted to [facility] in early am. Intubated. Bronch 2/22 Worsening respiratory status and vasopressor requirement. Agitation on vent 2/23 Improving pressor requirement 2/24 Off pressors. Right chest tube placement. Propofol changed versed as TRG increased 2/25 About 650 out of CT since insertion. Stable vent requirements. Sedation and agitation a challenge / librium added. Fever. TPA into chest tube 2/26 FOB for mucus plugging 2/27 Briefly on neo overnight / thought sedation related  2/28 Repeat CT with little residual pleural fluid, largely consolidated lung. On vent. Oral oxy added.  3/01 Ceiling of IV gtt's reduced, weaning on PSV 10/5 - weaned for 8h. Diuresis.   3/02 Sedation changed, hypotension, levophed initiated. FOB with BAL, chest tube removed.   3/04 Discontinue aline, PICC placed LUE 3/05 Increase oral oxy, wean dilaudid 3/06 Improved fever curve on linezolid, challenging sedation 3/07 Difficulties with sedation > changed back  to versed, dilaudid. Oral methadone added.  3/08 R white out on CXR / mucus plugging. Methadone stopped, re-challenge with ketamine. FOB with BAL.  3/09 Calmer on ketamine at higher dose, on propofol 20 mcg, dilaudid 13m 3/10 Propofol stopped overnight, restarted due to agitation. Ketamine increased. Propofol stopped during day. 3/11 Remains on ketamine, dilaudid down to 173m/ weaning  3/12 added clonidine to help with dilaudid wean completely off > drip stopped and still needed prn for HBP 3/12 added reglan 5 mg IV q6h  for emesis   Consults:  TRH PCCM ID  Procedures:  ETT 2/21 >> 3/9 R Radial Aline 2/20 >> 3/4  L IJ TLC 2/21 >> 3/4 R CT 2/24 >> 3/2 LUE PICC 3/4 >>  TrLurline IdolBJanace Hoard3/9 >>   Significant Diagnostic Tests:   CTA Chest 2/19 >> right sided consolidation and ground glass opacities in the right and middle lobe. Background centrilobular emphysema present. No pulmonary embolism  CT CAP 2/24 >> Interval enlargement of right pleural effusion with some loculation. Progression of right airspace disease with cavitation in right middle/lower lobe. Anasarca  CT Chest w/o 2/28 >> no large amount of residual pleural fluid present with most of the low-density abnormality in the right mid to lower chest felt to represent necrotic and consolidated RLL, RML. There is still some fluid component in the posterior pleural space superior to the tip of the indwelling chest tube  CT Chest 3/8 >> persistent area of RML, RLL airspace disease with decreasing cavitation and increasing consolidation since prior study, increased airspace disease throughout the aerated RUL, consistent with progressive PNA. Stable LLL consolidation, trace L pleural effusion   TEE 3/11 >> EF 45-50%  No veg/ L to R ASD   Micro Data:  Blood culture 2/19 >> negative  BAL resp cx 2/20 >> MRSA BAL fungal cx 2/20 >> candida albicans BAL AFB cx 2/20 >> no AFB on smear >>  Urine strep ag 2/21 >> neg Urine legionella ag 2/21  >> neg BAL 2/26 >> MRSA  BAL 3/2 >> few MRSA   BAL 3/8 >> rare staph aureus >>  Rare MRSA   Antimicrobials:  Ceftriaxone 2/20 >> 2/21 Azithro 2/20 >> 2/21 Vanc 2/21 >> 3/4  Zosyn 2/21 >> 2/23 Linezolid 3/4 >> 3/8  Ceftaroline 3/8 >>   Scheduled Meds: . amiodarone  200 mg Per Tube Daily  . chlorhexidine gluconate (MEDLINE KIT)  15 mL Mouth Rinse BID  . Chlorhexidine Gluconate Cloth  6 each Topical Daily  . clonazePAM  2 mg Per Tube Q8H  . cloNIDine  0.2 mg Per Tube TID  . dextrose  25 g Intravenous Once  . docusate  100 mg Per Tube BID  . enoxaparin (LOVENOX) injection  100 mg Subcutaneous Q12H  . feeding supplement (PIVOT 1.5 CAL)  1,000 mL Per Tube Q24H  . feeding supplement (PROSource TF)  45 mL Per Tube Daily  . folic acid  1 mg Per Tube Daily  . guaiFENesin  10 mL Per Tube BID  . hydrocerin   Topical Daily  . mouth rinse  15 mL Mouth Rinse 10 times per day  . metoCLOPramide (REGLAN) injection  5 mg Intravenous Q6H  . metoprolol tartrate  50 mg Per Tube BID  . multivitamin  15 mL Per Tube Daily  . nystatin   Topical BID  . pantoprazole sodium  40 mg Per Tube Daily  . potassium chloride  40 mEq Per Tube Daily  . QUEtiapine  100 mg Per Tube BID  . sodium chloride flush  10-40 mL Intracatheter Q12H  . thiamine  100 mg Per Tube Daily   Continuous Infusions: . sodium chloride 10 mL/hr at 09/16/20 0036  . sodium chloride    . ceFTAROline (TEFLARO) IV 600 mg (09/18/20 0802)  . dextrose Stopped (09/16/20 2100)  . ketamine (KETALAR) Adult IV Infusion 1.5 mg/kg/hr (09/18/20 0755)   PRN Meds:.acetaminophen, albuterol, hydrALAZINE, HYDROmorphone (DILAUDID) injection, ibuprofen, lip balm, midazolam, polyethylene glycol, sodium chloride flush   Interim History / Subjective:  Still requiring hydralazine for hbp despite clonidine 0.2 mg tid with pulse near 100 Started reglan p emesis 3/13 > resolved and ready to restart tf No supplemental dilaudid overnight   Objective   Blood  pressure (!) 178/107, pulse 98, temperature 99.86 F (37.7 C), resp. rate (!) 28, height _0  (1.803 m), weight 91.3 kg, SpO2 96 %.    Vent Mode: CPAP;PSV FiO2 (%):  [30 %-40 %] 30 % Set Rate:  [16 bmp] 16 bmp Vt Set:  [600 mL] 600 mL PEEP:  [5 cmH20] 5 cmH20 Pressure Support:  [5 cmH20] 5 cmH20 Plateau Pressure:  [16 cmH20-22 cmH20] 18 cmH20   Intake/Output Summary (Last 24 hours) at 09/18/2020 0923 Last data filed at 09/18/2020 0545 Gross per 24 hour  Intake 1356.12 ml  Output 4875 ml  Net -3518.88 ml   Filed Weights   09/16/20 0500 09/17/20 0500 09/18/20 0500  Weight: 95.6 kg 93.7 kg 91.3 kg   Physical Exam: Tmax  100.8  Pt on PSV somewhat restless despite ketamine drip  No jvd Oropharynx et  Neck supple Lungs with a few scattered exp > insp rhonchi bilaterally RRR no s3 or or sign murmur Abd mod distended limited excursion on  reglan 5 mg iv q 6 h and tf held  Extr warm with no edema or clubbing noted             Resolved Hospital Problem list   Elevated LFTs - likely combination of hx and congestive hepatopathy AKI Aspiration pneumonia/pneumonitis in setting of altered mental status Septic shock (off pressors as of 2/24) Cardiogenic shock   Assessment & Plan:   Acute Hypoxemic Respiratory Failure secondary to MRSA PNA with Effusion  / Necrotizing PNA Parapneumonic Effusion versus Empyema  Mucus Plugging  S/p chest tube placement 2/24, tPA 2/25, 2/26.  Chest tube removed 3/2.  -trach care per protocol  -will likely need XLT distal trach  -chest PT Q4  -follow intermittent CXR > repeat am 3/14    Septic +/- component Cardiogenic Shock secondary to MRSA PNA -abx as above - bp on high side off dilaudid despite neg I/0 last 48 h autodiuresing    New onset heart failure with ejection fraction of 35 to 40% New onset atrial flutter/SVT secondary to sepsis Demand ischemia Thoracic Aneurysm - will need outpatient follow up  Cardiology signed off > would  like call back once more stable to titrate HF medications / follow up.   -continue amiodarone PT  -tele monitoring  -full dose lovenox  -follow QTc daily , 447m on 3/10 - added lopressor 50 mg q 12 h 34/40   Acute Metabolic Encephalopathy / Agitated Delirium  Significant Alcohol use and Narcotic Dependence Concern for withdrawal and sepsis -significant improvement on ketamine, ceiling of 270mkg/hr as needed for sedation  -holding  home suboxone  -wean dilaudid to off > started clonidine am 3/12 and increased to 0.2 mg tid pm 3/12  -continue seroquel 100 mg BID  -continue klonopin 36m336mID (last increased 3/10) -plan is to wean oral agents once off dilaudid  -see Palliative Care note from 3/8   Hypokalemia  -continue daily KCL -monitor, additional KCL as indicated with diuresis   Moderate Protein Calorie Malnutrition  -TF per Nutrition  -prosource PT   Macrocytic Anemia    Lab Results  Component Value Date   HGB 8.5 (L) 09/17/2020   HGB 8.3 (L) 09/16/2020   HGB 9.1 (L) 09/14/2020   HGB 15.6 04/11/2020   HGB 15.8 09/01/2019   HGB 16.1 06/01/2019  -follow CBC trend  -folate, MVI, thiamine  Best practice (evaluated daily)  Diet: TF Pain/Anxiety/Delirium protocol (if indicated): PAD goal 0 to -1 VAP protocol (if indicated): Yes DVT prophylaxis: Lovenox, treatment dose GI prophylaxis: Protonix Glucose control: CBG q4h Mobility: As tolerated Disposition: ICU  Goals of Care:  Last date of multidisciplinary goals of care discussion:  Family and staff present:  Summary of discussion:  Follow up goals of care discussion due: 3/14 Code Status: Full code  Family:Wife  updated in detail 3/12.     The patient is critically ill with multiple organ systems failure and requires high complexity decision making for assessment and support, frequent evaluation and titration of therapies, application of advanced monitoring technologies and extensive interpretation of multiple  databases. Critical Care Time devoted to patient care services described in this note is 45 minutes.    MicChristinia GullyD Pulmonary and [phone] 7:00 pm call Elink  [phone]

## 2020-09-19 ENCOUNTER — Inpatient Hospital Stay (HOSPITAL_COMMUNITY): Payer: Medicare HMO

## 2020-09-19 DIAGNOSIS — D72829 Elevated white blood cell count, unspecified: Secondary | ICD-10-CM | POA: Diagnosis not present

## 2020-09-19 DIAGNOSIS — J9600 Acute respiratory failure, unspecified whether with hypoxia or hypercapnia: Secondary | ICD-10-CM

## 2020-09-19 DIAGNOSIS — J850 Gangrene and necrosis of lung: Secondary | ICD-10-CM | POA: Diagnosis not present

## 2020-09-19 DIAGNOSIS — J15212 Pneumonia due to Methicillin resistant Staphylococcus aureus: Secondary | ICD-10-CM | POA: Diagnosis not present

## 2020-09-19 LAB — CREATININE, SERUM
Creatinine, Ser: 0.63 mg/dL (ref 0.61–1.24)
GFR, Estimated: >60 mL/min (ref >60)

## 2020-09-19 LAB — CBC
HCT: 30.7 % — ABNORMAL LOW (ref 39.0–52.0)
Hemoglobin: 10 g/dL — ABNORMAL LOW (ref 13.0–17.0)
MCH: 30.4 pg (ref 26.0–34.0)
MCHC: 32.6 g/dL (ref 30.0–36.0)
MCV: 93.3 fL (ref 80.0–100.0)
Platelets: 435 10*3/uL — ABNORMAL HIGH (ref 150–400)
RBC: 3.29 MIL/uL — ABNORMAL LOW (ref 4.22–5.81)
RDW: 15.8 % — ABNORMAL HIGH (ref 11.5–15.5)
WBC: 18 10*3/uL — ABNORMAL HIGH (ref 4.0–10.5)
nRBC: 0 % (ref 0.0–0.2)

## 2020-09-19 LAB — BASIC METABOLIC PANEL
Anion gap: 10 (ref 5–15)
BUN: 18 mg/dL (ref 6–20)
CO2: 20 mmol/L — ABNORMAL LOW (ref 22–32)
Calcium: 9 mg/dL (ref 8.9–10.3)
Chloride: 103 mmol/L (ref 98–111)
Creatinine, Ser: 0.58 mg/dL — ABNORMAL LOW (ref 0.61–1.24)
GFR, Estimated: >60 mL/min (ref >60)
Glucose, Bld: 392 mg/dL — ABNORMAL HIGH (ref 70–99)
Potassium: 3.4 mmol/L — ABNORMAL LOW (ref 3.5–5.1)
Sodium: 133 mmol/L — ABNORMAL LOW (ref 135–145)

## 2020-09-19 LAB — GLUCOSE, CAPILLARY
Glucose-Capillary: 82 mg/dL (ref 70–99)
Glucose-Capillary: 93 mg/dL (ref 70–99)
Glucose-Capillary: 102 mg/dL — ABNORMAL HIGH (ref 70–99)
Glucose-Capillary: 91 mg/dL (ref 70–99)
Glucose-Capillary: 88 mg/dL (ref 70–99)
Glucose-Capillary: 117 mg/dL — ABNORMAL HIGH (ref 70–99)

## 2020-09-19 IMAGING — DX DG ABDOMEN 1V
1 series · 1 of 1 positions shown · non-contrast
Comparison: Portable exam at [16] hours compared to [16] hours

CLINICAL DATA: Nasogastric tube placement

EXAM:
ABDOMEN - 1 VIEW

[abdomen kub]
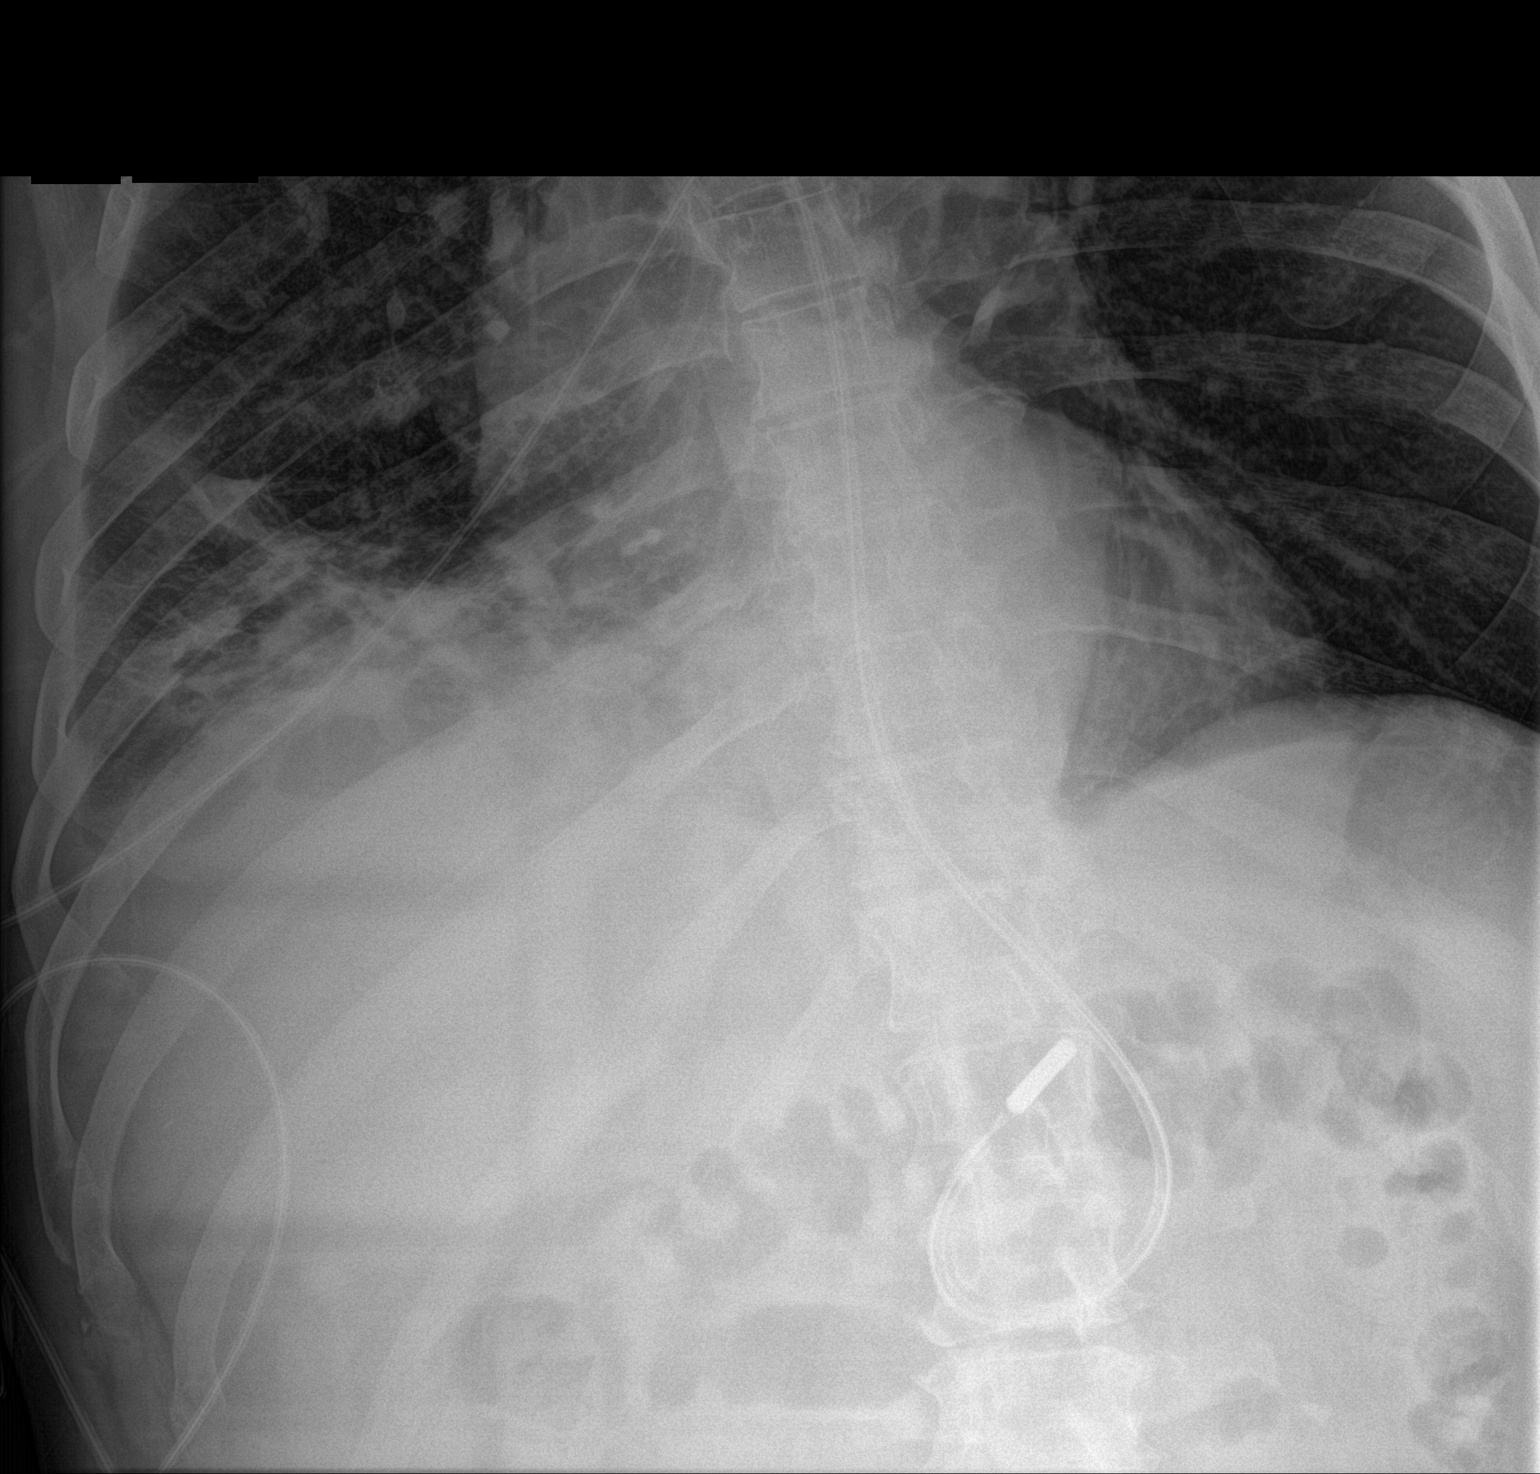

[1 of 1 positions shown; findings below may reference images not displayed]

FINDINGS: Tip of feeding tube coiled in proximal stomach.

Visualized bowel gas pattern normal.

Atelectasis and infiltrate at RIGHT lung base.
IMPRESSION: Feeding tube coiled in stomach.

## 2020-09-19 IMAGING — DX DG ABD PORTABLE 1V
1 series · 1 of 1 positions shown · non-contrast
Comparison: Chest [DATE].  Abdomen [DATE]

CLINICAL DATA: NG placement

EXAM:
PORTABLE ABDOMEN - 1 VIEW

[abdomen kub]
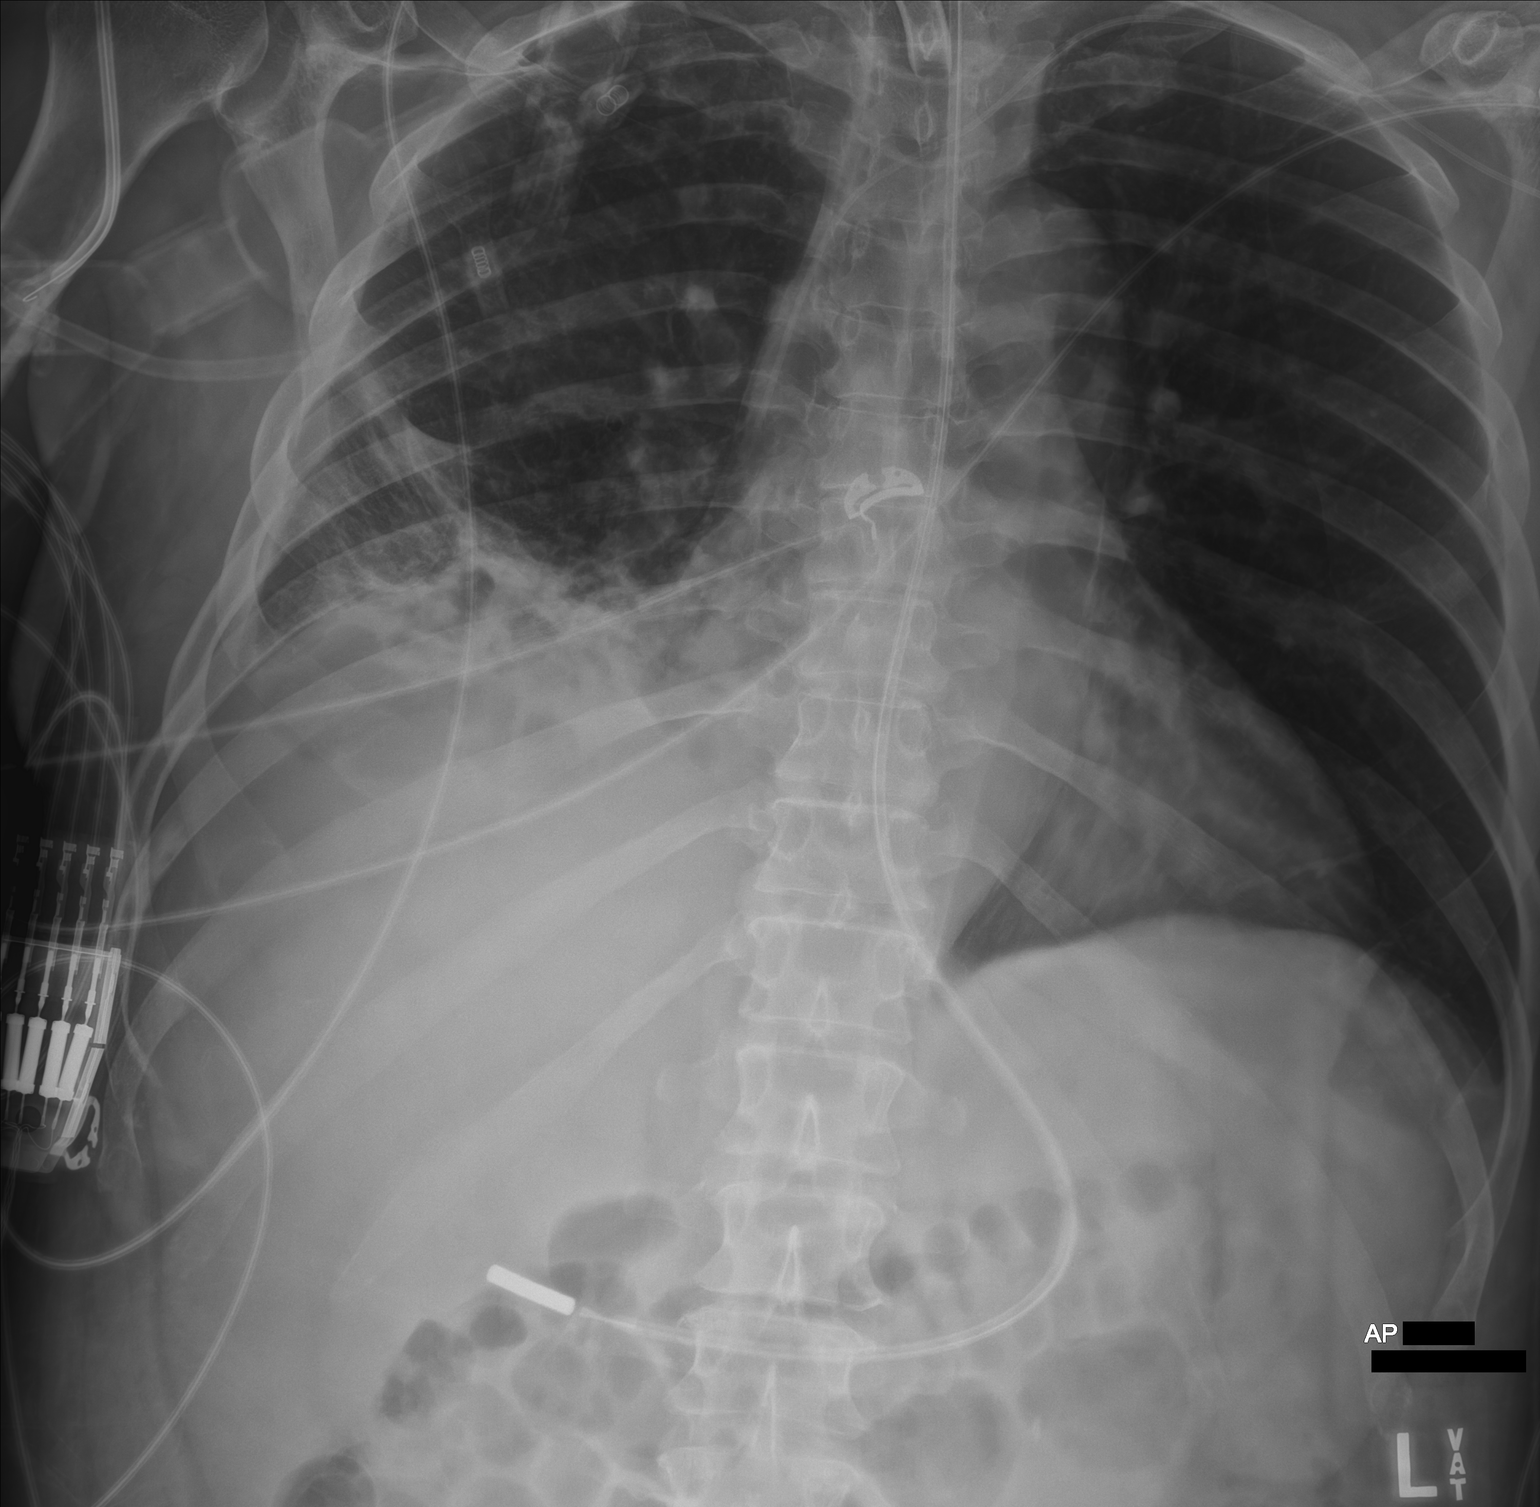

[1 of 1 positions shown; findings below may reference images not displayed]

FINDINGS: Feeding tube in the region the gastric antrum near the pylorus. No
dilated bowel loops

Right lower lobe airspace disease unchanged from the prior chest
x-ray.
IMPRESSION: Feeding tube tip in the region of the gastric antrum.

## 2020-09-19 IMAGING — DX DG CHEST 1V PORT
1 series · 2 of 2 positions shown · non-contrast
Comparison: [DATE]

CLINICAL DATA: Respiratory failure.

EXAM:
PORTABLE CHEST 1 VIEW

[Series 1: chest ap · 0.14mm/px · 2 of 2 slices shown]
[im 1/2]
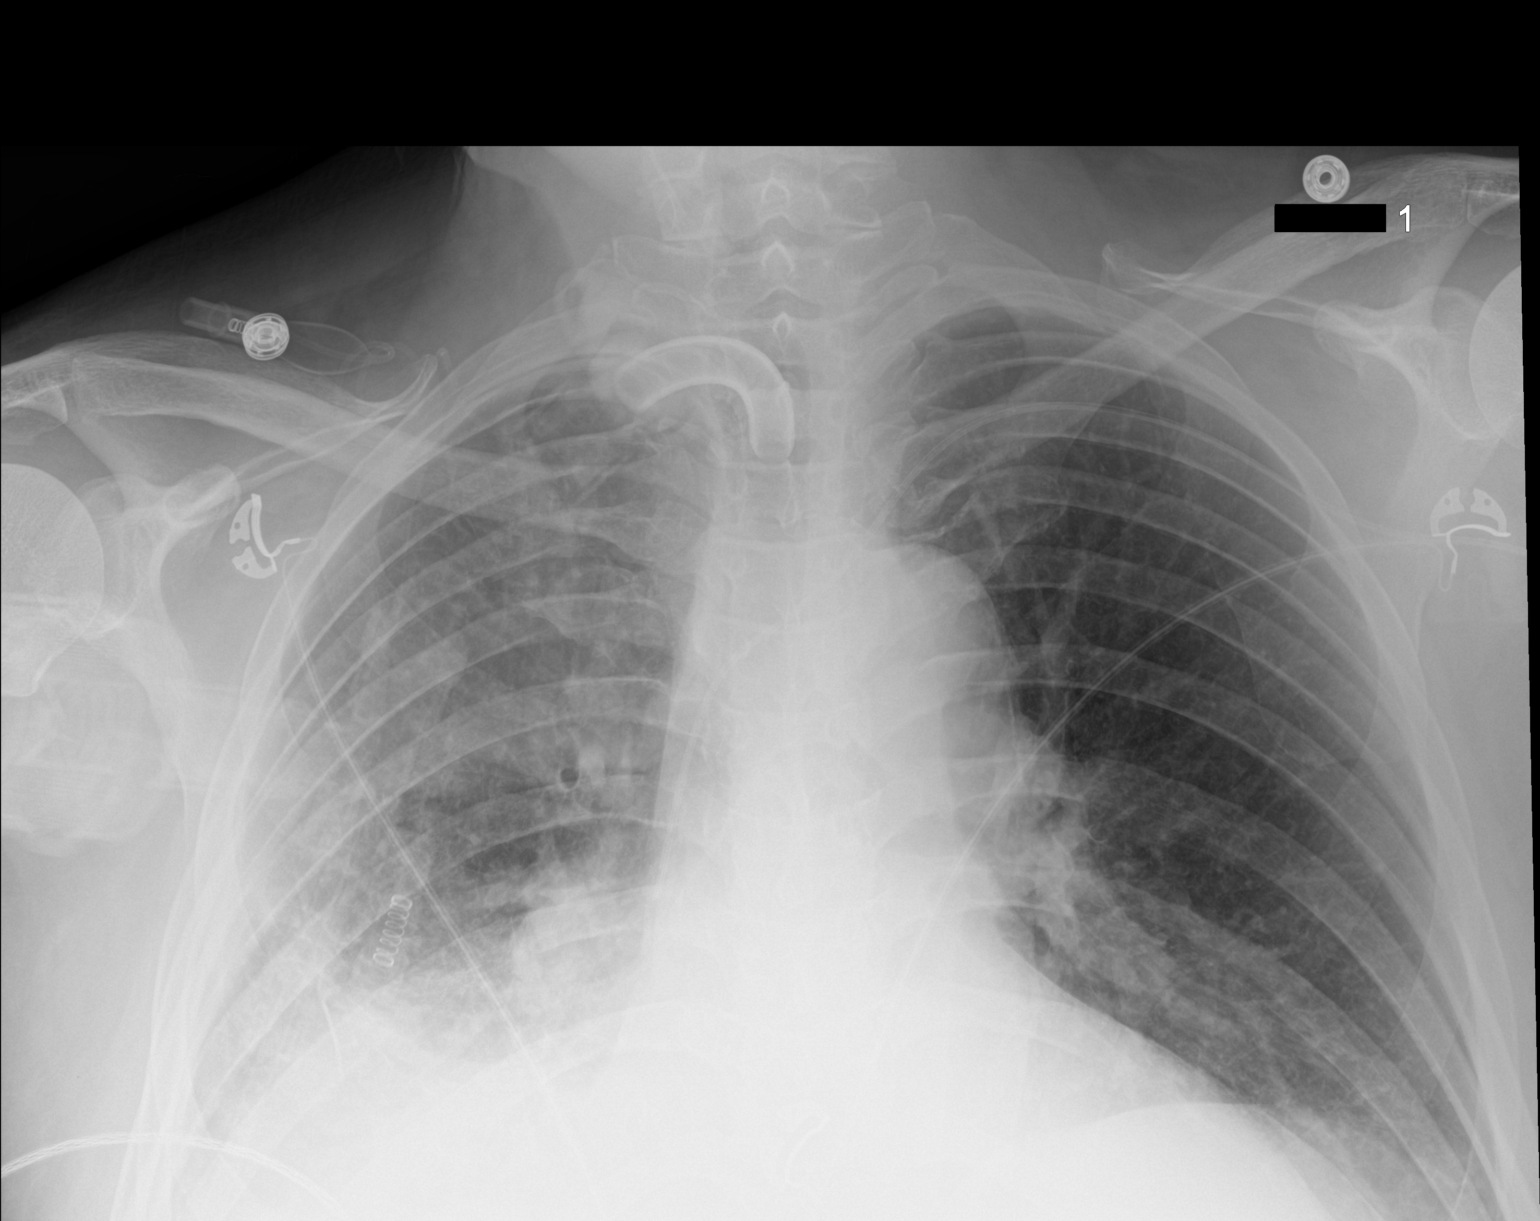
[im 2/2]
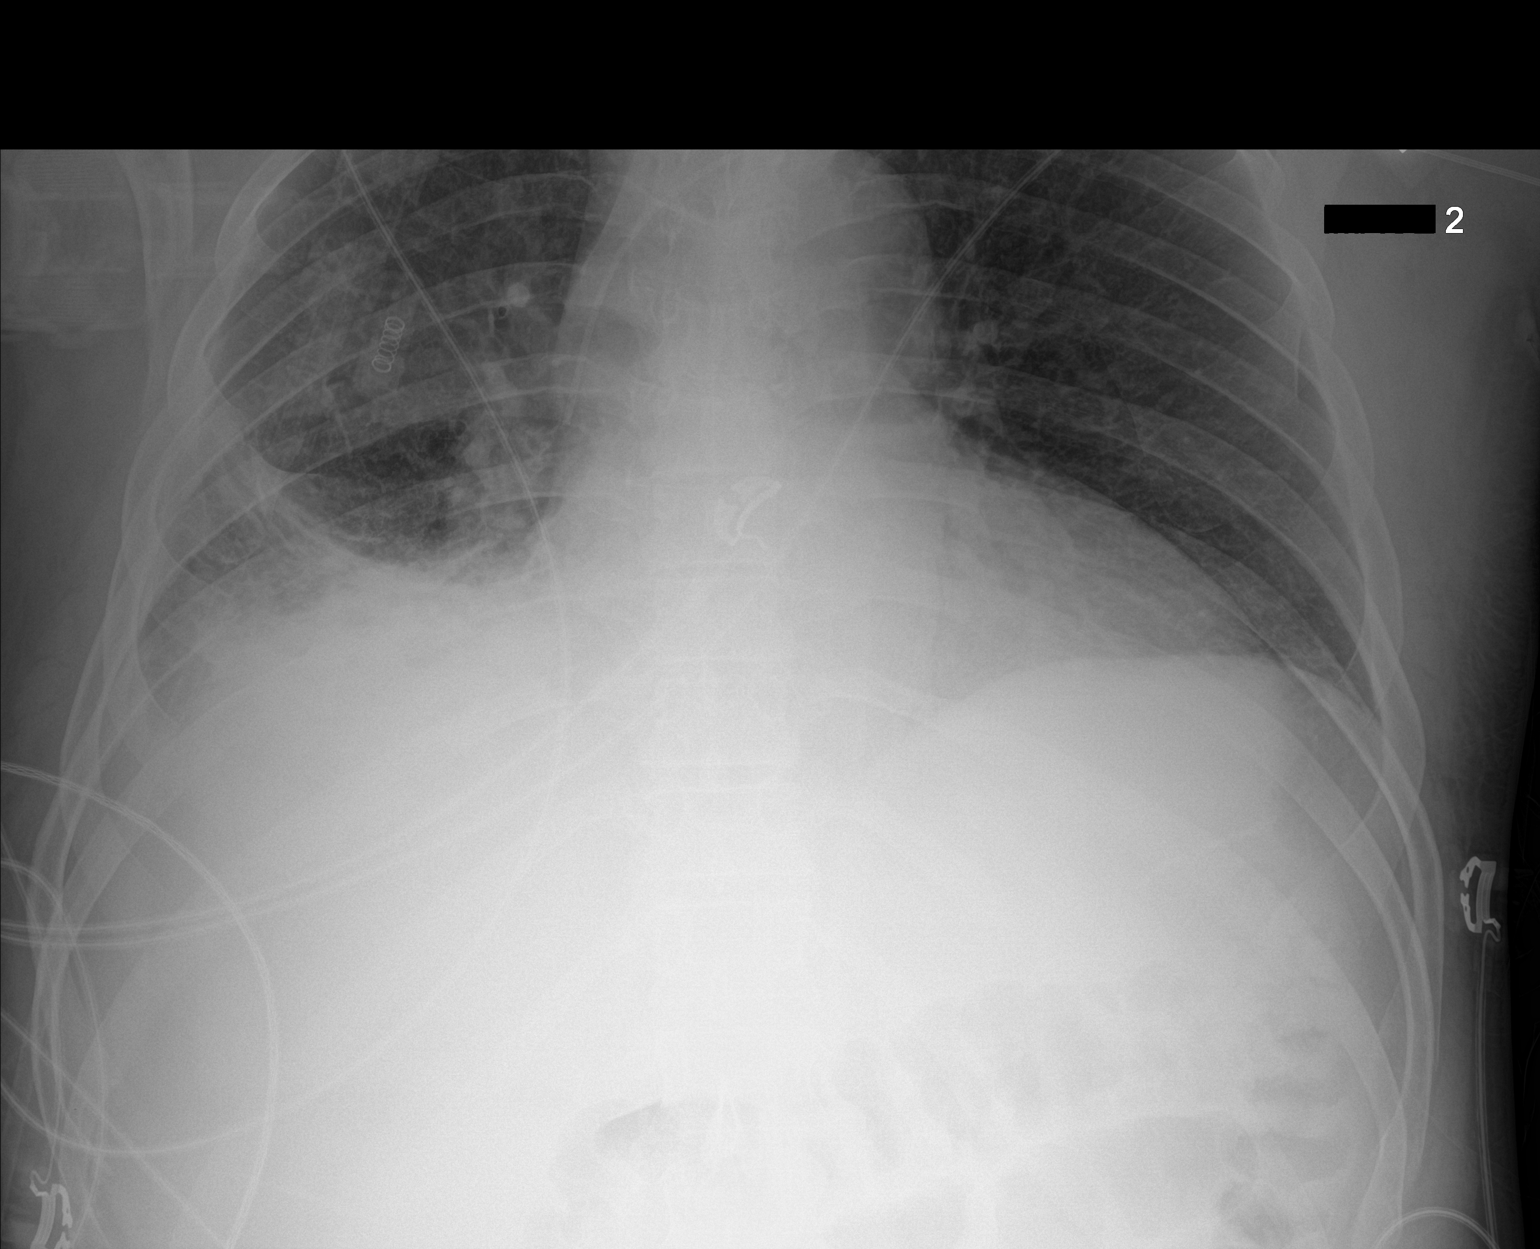

[2 of 2 positions shown; findings below may reference images not displayed]

FINDINGS: Stable appearance of tracheostomy. Feeding tube is been removed.
Aeration of the right lung appears mildly improved with residual
atelectasis/consolidation remaining in the right lower lung with
potential component of right pleural fluid. No pneumothorax.
IMPRESSION: Mildly improved aeration of the right lung with residual
atelectasis/consolidation in the right lower lung and possible
component of right pleural fluid.

## 2020-09-19 MED ORDER — HYDROXYZINE HCL 25 MG PO TABS
25.0000 mg | ORAL_TABLET | Freq: Four times a day (QID) | ORAL | Status: DC | PRN
  Administered 2020-09-19: 25 mg via ORAL
  Filled 2020-09-19: qty 1

## 2020-09-19 MED ORDER — CHLORDIAZEPOXIDE HCL 25 MG PO CAPS
25.0000 mg | ORAL_CAPSULE | Freq: Three times a day (TID) | ORAL | Status: AC
  Administered 2020-09-20 (×3): 25 mg
  Filled 2020-09-19 (×3): qty 1

## 2020-09-19 MED ORDER — ONDANSETRON 4 MG PO TBDP: 4.0000 mg | ORAL_TABLET | Freq: Four times a day (QID) | ORAL | Status: DC | PRN

## 2020-09-19 MED ORDER — CHLORDIAZEPOXIDE HCL 25 MG PO CAPS
25.0000 mg | ORAL_CAPSULE | Freq: Four times a day (QID) | ORAL | Status: AC
  Administered 2020-09-19 (×2): 25 mg
  Filled 2020-09-19 (×2): qty 1

## 2020-09-19 MED ORDER — CHLORDIAZEPOXIDE HCL 25 MG PO CAPS: 25.0000 mg | ORAL_CAPSULE | ORAL | Status: DC

## 2020-09-19 MED ORDER — POTASSIUM CHLORIDE 20 MEQ PO PACK
40.0000 meq | PACK | ORAL | Status: AC
  Administered 2020-09-19 (×2): 40 meq
  Filled 2020-09-19 (×2): qty 2

## 2020-09-19 MED ORDER — CHLORDIAZEPOXIDE HCL 25 MG PO CAPS
25.0000 mg | ORAL_CAPSULE | Freq: Four times a day (QID) | ORAL | Status: DC
  Administered 2020-09-19 (×2): 25 mg via ORAL
  Filled 2020-09-19 (×2): qty 1

## 2020-09-19 MED ORDER — CHLORDIAZEPOXIDE HCL 25 MG PO CAPS: 25.0000 mg | ORAL_CAPSULE | Freq: Three times a day (TID) | ORAL | Status: DC

## 2020-09-19 MED ORDER — LOPERAMIDE HCL 2 MG PO CAPS
2.0000 mg | ORAL_CAPSULE | ORAL | Status: DC | PRN
  Filled 2020-09-19: qty 2

## 2020-09-19 MED ORDER — BETHANECHOL CHLORIDE 10 MG PO TABS
10.0000 mg | ORAL_TABLET | Freq: Three times a day (TID) | ORAL | Status: DC
  Administered 2020-09-19: 10 mg via ORAL
  Filled 2020-09-19: qty 1

## 2020-09-19 MED ORDER — HYDROXYZINE HCL 25 MG PO TABS
25.0000 mg | ORAL_TABLET | Freq: Four times a day (QID) | ORAL | Status: DC | PRN
  Administered 2020-09-19: 25 mg
  Filled 2020-09-19: qty 1

## 2020-09-19 MED ORDER — BETHANECHOL CHLORIDE 10 MG PO TABS
10.0000 mg | ORAL_TABLET | Freq: Three times a day (TID) | ORAL | Status: DC
  Administered 2020-09-19 – 2020-09-25 (×19): 10 mg
  Filled 2020-09-19 (×20): qty 1

## 2020-09-19 MED ORDER — FUROSEMIDE 10 MG/ML IJ SOLN
40.0000 mg | Freq: Once | INTRAMUSCULAR | Status: AC
  Administered 2020-09-19: 40 mg via INTRAVENOUS
  Filled 2020-09-19: qty 4

## 2020-09-19 MED ORDER — QUETIAPINE FUMARATE 100 MG PO TABS
200.0000 mg | ORAL_TABLET | Freq: Two times a day (BID) | ORAL | Status: DC
  Administered 2020-09-19 – 2020-09-25 (×13): 200 mg
  Filled 2020-09-19 (×13): qty 2

## 2020-09-19 MED ORDER — CHLORDIAZEPOXIDE HCL 25 MG PO CAPS
25.0000 mg | ORAL_CAPSULE | ORAL | Status: AC
  Administered 2020-09-21 (×2): 25 mg
  Filled 2020-09-19 (×2): qty 1

## 2020-09-19 MED ORDER — CHLORDIAZEPOXIDE HCL 25 MG PO CAPS: 25.0000 mg | ORAL_CAPSULE | Freq: Every day | ORAL | Status: DC

## 2020-09-19 MED ORDER — CHLORDIAZEPOXIDE HCL 25 MG PO CAPS
25.0000 mg | ORAL_CAPSULE | Freq: Every day | ORAL | Status: AC
  Administered 2020-09-22: 25 mg
  Filled 2020-09-19: qty 1

## 2020-09-19 NOTE — Progress Notes (Signed)
[facility] for Infectious Disease    Date of Admission:  08/27/2020   Total days of antibiotics 20   ID: Joseph Ayala is a 55 y.o. male with MRSA necrotizing pneumonia Principal Problem:   Necrotizing pneumonia ([place]) Active Problems:   Opioid use disorder   Chronic pain syndrome   Encounter for chest tube placement   CAP (community acquired pneumonia)   Acute respiratory failure with hypoxia ([place])   Sepsis ([place])   Chest pain   Pleural effusion   Thoracic aortic aneurysm without rupture ([place])   Pressure injury of skin   MRSA pneumonia ([place])   Acute hypoxemic respiratory failure ([place])   Acute metabolic encephalopathy    Subjective: Afebrile x 48hr. Remains on ketamine drip. Primary team titrating medications for agitation.  Medications:  . amiodarone  200 mg Per Tube Daily  . bethanechol  10 mg Per Tube TID  . chlordiazePOXIDE  25 mg Per Tube QID   Followed by  . [START ON 09/20/2020] chlordiazePOXIDE  25 mg Per Tube TID   Followed by  . [START ON 09/21/2020] chlordiazePOXIDE  25 mg Per Tube BH-qamhs   Followed by  . [START ON 09/22/2020] chlordiazePOXIDE  25 mg Per Tube Daily  . chlorhexidine gluconate (MEDLINE KIT)  15 mL Mouth Rinse BID  . Chlorhexidine Gluconate Cloth  6 each Topical Daily  . clonazePAM  2 mg Per Tube Q8H  . cloNIDine  0.2 mg Per Tube TID  . dextrose  25 g Intravenous Once  . docusate  100 mg Per Tube BID  . enoxaparin (LOVENOX) injection  100 mg Subcutaneous Q12H  . feeding supplement (PIVOT 1.5 CAL)  1,000 mL Per Tube Q24H  . feeding supplement (PROSource TF)  45 mL Per Tube Daily  . folic acid  1 mg Per Tube Daily  . guaiFENesin  10 mL Per Tube BID  . hydrocerin   Topical Daily  . mouth rinse  15 mL Mouth Rinse 10 times per day  . metoCLOPramide (REGLAN) injection  5 mg Intravenous Q6H  . metoprolol tartrate  50 mg Per Tube BID  . multivitamin  15 mL Per Tube Daily  . nystatin   Topical BID  . pantoprazole sodium  40 mg Per Tube  Daily  . potassium chloride  40 mEq Per Tube Daily  . QUEtiapine  200 mg Per Tube BID  . sodium chloride flush  10-40 mL Intracatheter Q12H  . thiamine  100 mg Per Tube Daily    Objective: Vital signs in last 24 hours: Temp:  [98.42 F (36.9 C)-100.22 F (37.9 C)] 98.42 F (36.9 C) (03/14 1800) Pulse Rate:  [78-112] 93 (03/14 1800) Resp:  [18-32] 26 (03/14 1800) BP: (107-180)/(76-118) 146/103 (03/14 1800) SpO2:  [93 %-99 %] 94 % (03/14 1800) FiO2 (%):  [30 %] 30 % (03/14 1607) Weight:  [90.1 kg] 90.1 kg (03/14 0439) Physical Exam  Constitutional: He is sedated He appears well-developed and well-nourished. No distress.  HENT:  Mouth/Throat: Oropharynx is clear and moist. No oropharyngeal exudate. Trach in place Cardiovascular: Normal rate, regular rhythm and normal heart sounds. Exam reveals no gallop and no friction rub.  No murmur heard.  Pulmonary/Chest: Effort normal and breath sounds normal. No respiratory distress. He has no wheezes.  Abdominal: Soft. Bowel sounds are normal. He exhibits no distension. There is no tenderness.  Neurological: He is opening eyes to verbal stimuli Skin: Skin is warm and dry. No rash noted. No erythema.  Ext: trace edema to arms/less so on lower extremity   Lab Results Recent Labs    09/17/20 0200 09/18/20 0542 09/19/20 0421 09/19/20 0730 09/19/20 1112  WBC 13.2*  --   --   --  18.0*  HGB 8.5*  --   --   --  10.0*  HCT 27.4*  --   --   --  30.7*  NA 138  --   --  133*  --   K 3.8  --   --  3.4*  --   CL 102  --   --  103  --   CO2 28  --   --  20*  --   BUN 17  --   --  18  --   CREATININE 0.49*   < > 0.63 0.58*  --    < > = values in this interval not displayed.   cxr right sided effusion Microbiology: reviewed Studies/Results: DG Abd 1 View  Result Date: 09/19/2020 CLINICAL DATA:  Nasogastric tube placement EXAM: ABDOMEN - 1 VIEW COMPARISON:  Portable exam at 1114 hours compared to 1005 hours FINDINGS: Tip of feeding tube  coiled in proximal stomach. Visualized bowel gas pattern normal. Atelectasis and infiltrate at RIGHT lung base. IMPRESSION: Feeding tube coiled in stomach. Electronically Signed   By: Lavonia Dana M.D.   On: 09/19/2020 11:37   DG Chest Port 1 View  Result Date: 09/19/2020 CLINICAL DATA:  Respiratory failure. EXAM: PORTABLE CHEST 1 VIEW COMPARISON:  09/17/2020 FINDINGS: Stable appearance of tracheostomy. Feeding tube is been removed. Aeration of the right lung appears mildly improved with residual atelectasis/consolidation remaining in the right lower lung with potential component of right pleural fluid. No pneumothorax. IMPRESSION: Mildly improved aeration of the right lung with residual atelectasis/consolidation in the right lower lung and possible component of right pleural fluid. Electronically Signed   By: Aletta Edouard M.D.   On: 09/19/2020 07:42   DG Abd Portable 1V  Result Date: 09/19/2020 CLINICAL DATA:  NG placement EXAM: PORTABLE ABDOMEN - 1 VIEW COMPARISON:  Chest 09/19/2020.  Abdomen 09/16/2020 FINDINGS: Feeding tube in the region the gastric antrum near the pylorus. No dilated bowel loops Right lower lobe airspace disease unchanged from the prior chest x-ray. IMPRESSION: Feeding tube tip in the region of the gastric antrum. Electronically Signed   By: Franchot Gallo M.D.   On: 09/19/2020 10:20     Assessment/Plan: Complicated MRSA necrotizing pneumonia with empyema s/p chest tube removed = continue with ceftaroline. Plan for 14 days IV then will change to orals.  Leukocytosis = recommend to repeat cbc with differential tomorrow  Acute respiratory failure due to MRSA pneumonia = continues on vent support with trach collar trials per [facility] for Infectious Diseases Cell: [phone] Pager: [phone]  09/19/2020, 6:11 PM

## 2020-09-19 NOTE — Progress Notes (Signed)
Patient was having urine leaking around foley. RN attempted to flush foley and re-inflate balloon; no improvement. Per protocol, this RN replaced foley with additional RN. RN will continue to carefully monitor UOP.

## 2020-09-19 NOTE — Progress Notes (Signed)
NGT removed by patient. ELink aware and recommend reevaluating need for NGT in the am.

## 2020-09-19 NOTE — TOC Progression Note (Signed)
Transition of Care [facility]) - Progression Note    Patient Details  Name: Joseph Ayala MRN: [id number] Date of Birth: [birth year]  Transition of Care [facility]) CM/SW Contact  Leeroy Cha, RN Phone Number: 09/19/2020, 7:34 AM  Clinical Narrative:    Significant Hospital Events:  2/19 Arrived to ED for cough and chest pain x 1 week 2/20 Admitted to [facility] in early am. Intubated. Bronch 2/22 Worsening respiratory status and vasopressor requirement. Agitation on vent 2/23 Improving pressor requirement 2/24 Off pressors. Right chest tube placement. Propofol changed versed as TRG increased 2/25 About 650 out of CT since insertion. Stable vent requirements. Sedation and agitation a challenge / librium added. Fever. TPA into chest tube 2/26 FOB for mucus plugging 2/27 Briefly on neo overnight / thought sedation related  2/28 Repeat CT with little residual pleural fluid, largely consolidated lung. On vent. Oral oxy added.  3/01 Ceiling of IV gtt's reduced, weaning on PSV 10/5 - weaned for 8h. Diuresis.   3/02 Sedation changed, hypotension, levophed initiated. FOB with BAL, chest tube removed.   3/04 Discontinue aline, PICC placed LUE 3/05 Increase oral oxy, wean dilaudid 3/06 Improved fever curve on linezolid, challenging sedation 3/07 Difficulties with sedation > changed back to versed, dilaudid. Oral methadone added.  3/08 R white out on CXR / mucus plugging. Methadone stopped, re-challenge with ketamine. FOB with BAL.  3/09 Calmer on ketamine at higher dose, on propofol 20 mcg, dilaudid 75m 3/10 Propofol stopped overnight, restarted due to agitation. Ketamine increased. Propofol stopped during day. 3/11 Remains on ketamine, dilaudid down to 197m/ weaning  3/12 added clonidine to help with dilaudid wean completely off > drip stopped and still needed prn for HBP 3/12 added reglan 5 mg IV q6h  for emesis  Plan-may need ltach placement when stable.   Expected Discharge Plan: Home/Self  Care Barriers to Discharge: Continued Medical Work up  Expected Discharge Plan and Services Expected Discharge Plan: Home/Self Care   Discharge Planning Services: CM Consult   Living arrangements for the past 2 months: Single Family Home                                       Social Determinants of Health (SDOH) Interventions    Readmission Risk Interventions No flowsheet data found.

## 2020-09-19 NOTE — Progress Notes (Signed)
Panda tube reinserted by this RN and charge RN per CCM orders.   Pt also removed flexi tube. RN cleaned and reinserted tube per protocol. This RN will continue to monitor pt and assess for anxiety/agitation.

## 2020-09-19 NOTE — Progress Notes (Signed)
Nutrition Follow-up  DOCUMENTATION CODES:   Not applicable  INTERVENTION:  - continue Pivot 1.5 @ 60 ml/hr with 45 ml Prosource TF once/day. - free water flush, if desired, to be per CCM.    NUTRITION DIAGNOSIS:   Inadequate oral intake related to inability to eat as evidenced by NPO status. -ongoing  GOAL:   Patient will meet greater than or equal to 90% of their needs -met with TF regimen  MONITOR:   Vent status,TF tolerance,Labs,Weight trends  ASSESSMENT:   55 year old male on Suboxone for necrotizing myopathy who presents with acute hypoxemic respiratory failure secondary to community-acquired pneumonia/aspiration. PCCM initially consulted for evaluation for bronchoscopy for possible mucous plugging. Transferred to ICU after worsening respiratory failure and mental status requiring intubation on 2/21.  Significant Events: 2/19- admission for CAP 2/20- intubation; OGT placement 2/21- initial RD assessment 2/22- initiation of trickle TF 2/23- off pressors 2/24- transitioned from propofol to versed d/t rising TGs; chest tube placed 2/26- mucus plugging 2/28- repeat CT showed small residual pleural fluid and largely consolidated lung 3/2- chest tube removed 3/4- OGT removed 3/5- small bore NGT placed in R nare (gastric) 3/8- CXR showed white out in R lung; mucus plugging 3/9- tracheostomy   Patient discussed in rounds this AM. He remains intubated via trach. Small bore NGT needed to be replaced after becoming dislodged earlier this AM; abdominal xray report states that tube is coiled in the stomach.   Able to talk with RN following rounds who reports patient had been tolerating TF prior to NGT replacement. He was receiving D10 while TF was off to prevent hypoglycemia.   Orders in place for Pivot 1.5 @ 60 ml/hr with 45 ml Prosource TF once/day. This regimen provides 2200 kcal, 146 grams protein, and 1093 ml free water.   Weight today (90.1 kg) is the lowest it has been  this admission. Mild pitting edema to all extremities documented in the edema section of flow sheet.     Patient is currently intubated on ventilator support MV: 14 L/min Temp (24hrs), Avg:99.8 F (37.7 C), Min:99.32 F (37.4 C), Max:100.22 F (37.9 C) Propofol: none BP: 180/117 and MAP: 138  Labs reviewed; CBGs: 82, 93, 102 mg/dl, Na: 133 mmol/l, K: 3.4 mmol/l, creatinine: 0.58 mg/dl. Medications reviewed; 1 mg folvite/day, 40 mg IV lasix x1 dose 3/14, 5 mg IV reglan QID, 15 ml multivitamin/day, 40 mg protonix per NGT/day, 40 mEq Klor-Con/day, 100 mg thiamine/day Drip; ketamine @ 1.5 mcg/kg/hr.    Diet Order:   Diet Order            Diet NPO time specified  Diet effective midnight                 EDUCATION NEEDS:   No education needs have been identified at this time  Skin:  Skin Assessment: Skin Integrity Issues: Skin Integrity Issues:: Stage II,Stage I Stage I: R elbow (newly documented 3/2) Stage II: bilateral buttocks (newly documented 3/2)  Last BM:  3/14 (300 ml via rectal tube)  Height:   Ht Readings from Last 1 Encounters:  09/14/20 _0  (1.803 m)    Weight:   Wt Readings from Last 1 Encounters:  09/19/20 90.1 kg    Estimated Nutritional Needs:  Kcal:  2252 kcal Protein:  140-155g Fluid:  >/= 2 L/day     Jarome Matin, MS, RD, LDN, CNSC Inpatient Clinical Dietitian RD pager # available in [place]  After hours/weekend pager # available in [facility]

## 2020-09-19 NOTE — Progress Notes (Signed)
NAME:  Joseph Ayala, MRN:  [id number], DOB:  [birth year], LOS: 35 ADMISSION DATE:  08/27/2020, CONSULTATION DATE:  08/28/20 REFERRING MD:  Darliss Cheney, MD CHIEF COMPLAINT:  Acute hypoxemic respiratory failure  Brief History:  55 year old male on Suboxone for necrotizing myopathy admitted 2/19 with acute hypoxemic respiratory failure secondary to MRSA community-acquired pneumonia / aspiration. PCCM initially consulted for evaluation for bronchoscopy for possible mucous plugging. Transferred to ICU after worsening respiratory failure and mental status requiring intubation on 2/21. ICU course complicated by hypotension requiring pressors, chest tube placement for empyema s/p intrapleural lytics. Trach 3/9.   Hx of poorly explained myopathy, self medicating, pulse steroids / methotrexate.    Past Medical History:  Hypertension, necrotizing myopathy, opioid use on Suboxone  Significant Hospital Events:  2/19 Arrived to ED for cough and chest pain x 1 week 2/20 Admitted to [facility] in early am. Intubated. Bronch 2/22 Worsening respiratory status and vasopressor requirement. Agitation on vent 2/23 Improving pressor requirement 2/24 Off pressors. Right chest tube placement. Propofol changed versed as TRG increased 2/25 About 650 out of CT since insertion. Stable vent requirements. Sedation and agitation a challenge / librium added. Fever. TPA into chest tube 2/26 FOB for mucus plugging 2/27 Briefly on neo overnight / thought sedation related  2/28 Repeat CT with little residual pleural fluid, largely consolidated lung. On vent. Oral oxy added.  3/01 Ceiling of IV gtt's reduced, weaning on PSV 10/5 - weaned for 8h. Diuresis.   3/02 Sedation changed, hypotension, levophed initiated. FOB with BAL, chest tube removed.   3/04 Discontinue aline, PICC placed LUE 3/05 Increase oral oxy, wean dilaudid 3/06 Improved fever curve on linezolid, challenging sedation 3/07 Difficulties with sedation > changed back  to versed, dilaudid. Oral methadone added.  3/08 R white out on CXR / mucus plugging. Methadone stopped, re-challenge with ketamine. FOB with BAL.  3/09 Calmer on ketamine at higher dose, on propofol 20 mcg, dilaudid 55m 3/10 Propofol stopped overnight, restarted due to agitation. Ketamine increased. Propofol stopped during day. 3/11 Remains on ketamine, dilaudid down to 162m/ weaning  3/12 added clonidine to help with dilaudid wean completely off > drip stopped and still needed prn for HBP 3/12 added reglan 5 mg IV q6h  for emesis  3/13 Still requiring hydralazine for hbp despite clonidine 0.2 mg tid with pulse near 100 Started reglan p emesis 3/13 > resolved and ready to restart tf No supplemental dilaudid overnight  3/14 added librium taper. Dec ketamine to 1.2  Consults:  TRH PCCM ID  Procedures:  ETT 2/21 >> 3/9 R Radial Aline 2/20 >> 3/4  L IJ TLC 2/21 >> 3/4 R CT 2/24 >> 3/2 LUE PICC 3/4 >>  TrLurline IdolBJanace Hoard3/9 >>   Significant Diagnostic Tests:   CTA Chest 2/19 >> right sided consolidation and ground glass opacities in the right and middle lobe. Background centrilobular emphysema present. No pulmonary embolism  CT CAP 2/24 >> Interval enlargement of right pleural effusion with some loculation. Progression of right airspace disease with cavitation in right middle/lower lobe. Anasarca  CT Chest w/o 2/28 >> no large amount of residual pleural fluid present with most of the low-density abnormality in the right mid to lower chest felt to represent necrotic and consolidated RLL, RML. There is still some fluid component in the posterior pleural space superior to the tip of the indwelling chest tube  CT Chest 3/8 >> persistent area of RML, RLL airspace disease with decreasing cavitation and increasing consolidation  since prior study, increased airspace disease throughout the aerated RUL, consistent with progressive PNA. Stable LLL consolidation, trace L pleural effusion   TEE 3/11 >>  EF 45-50%  No veg/ L to R ASD   Micro Data:  Blood culture 2/19 >> negative  BAL resp cx 2/20 >> MRSA BAL fungal cx 2/20 >> candida albicans BAL AFB cx 2/20 >> no AFB on smear >>  Urine strep ag 2/21 >> neg Urine legionella ag 2/21 >> neg BAL 2/26 >> MRSA  BAL 3/2 >> few MRSA   BAL 3/8 >> rare staph aureus >>  Rare MRSA   Antimicrobials:  Ceftriaxone 2/20 >> 2/21 Azithro 2/20 >> 2/21 Vanc 2/21 >> 3/4  Zosyn 2/21 >> 2/23 Linezolid 3/4 >> 3/8  Ceftaroline 3/8 >>    Interim History / Subjective:  Awake  No distress but still impulsive   Objective   Blood pressure (Abnormal) 161/118, pulse 94, temperature 99.68 F (37.6 C), resp. rate (Abnormal) 28, height _0  (1.803 m), weight 90.1 kg, SpO2 96 %.    Vent Mode: PSV FiO2 (%):  [30 %] 30 % Set Rate:  [16 bmp] 16 bmp Vt Set:  [600 mL] 600 mL PEEP:  [5 cmH20] 5 cmH20 Pressure Support:  [5 cmH20-10 cmH20] 8 cmH20 Plateau Pressure:  [20 YIR48-54 cmH20] 20 cmH20   Intake/Output Summary (Last 24 hours) at 09/19/2020 0936 Last data filed at 09/19/2020 6270 Gross per 24 hour  Intake 2566.99 ml  Output 2495 ml  Net 71.99 ml   Filed Weights   09/17/20 0500 09/18/20 0500 09/19/20 0439  Weight: 93.7 kg 91.3 kg 90.1 kg   Physical Exam: General 55 year old white male currently resting on pressure support ventilation he is in no acute distress but is restless HEENT tracheostomy unremarkable mucous membranes moist Pulmonary coarse scattered rhonchi throughout Cardiac regular rate and rhythm Abdomen soft Extremities warm and dry dependent edema Neuro interactive, follows commands, moves all extremities but generalized weakness, very impulsive GU: Clear yellow        Resolved Hospital Problem list    Assessment & Plan:Elevated LFTs - likely combination of hx and congestive hepatopathy AKI Aspiration pneumonia/pneumonitis in setting of altered mental status Septic shock (off pressors as of 2/24) Cardiogenic shock  Demand  ischemia Parapneumonic Effusion versus Empyema:S/p chest tube placement 2/24, tPA 2/25, 2/26.  Chest tube removed 3/2.  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 Tolerating weaning Agitation/delirium large barrier to progress at this point Portable chest x-ray personally reviewed: Ongoing right greater than left airspace disease aeration a little improved probably still some residual effusion Plan Day #7 ceftaroline, day #24 antibiotics we will continue antibiotics as recommended by infectious disease he will need an extended course given necrotizing pneumonia Daily attempts at spontaneous breathing trial, given mental status seems like aerosol trach collar exams also appropriate VAP bundle PAD protocol RASS goal 0 to -1 Chest x-ray intermittent Lasix x1  New onset HFrEF -follow-up TEE on 3/11 showed improvement of LVEF 45 to 50% echocardiogram was negative for vegetation/infectious endocarditis Thoracic Aneurysm - will need outpatient follow up  Plan We will need cardiology to see again as he gets closer to discharge to reassess heart failure regimen  Atrial fibrillation Plan Continue full dose Lovenox Continue Lopressor and amiodarone Continue telemetry monitoring    Acute Metabolic Encephalopathy / Agitated Delirium  Significant Alcohol use and  Narcotic Dependence Concern for withdrawal and sepsis -significant improvement on ketamine, ceiling of 80m/kg/hr as needed for sedation  Plan Holding Suboxone Continuing clonidine 0.2 mg via tube 3 times a day Cont Seroquel 100 bid Start librium taper  Wean Ketamine 0.237mhr every 12-24hrs   Fluid and electrolyte imbalance: Hypokalemia  Plan Replace K   Moderate Protein Calorie Malnutrition  Plan Cont tubefeeds.   Macrocytic Anemia  Plan Cont B vit replacement Trend  cbc Transfuse for hgb < 7  Best practice (evaluated daily)  Diet: TF Pain/Anxiety/Delirium protocol (if indicated): PAD goal 0 to -1 VAP protocol (if indicated): Yes DVT prophylaxis: Lovenox, treatment dose GI prophylaxis: Protonix Glucose control: CBG q4h Mobility: As tolerated Disposition: ICU  Goals of Care:  Last date of multidisciplinary goals of care discussion:  Family and staff present:  Summary of discussion:  Follow up goals of care discussion due: 3/14 Code Status: Full code  Family:Wife  updated in detail 3/12.    My cct 3458in PeErick ColaceCNP-BC [place]ager # [phone] # [phone] no answer

## 2020-09-20 DIAGNOSIS — J850 Gangrene and necrosis of lung: Secondary | ICD-10-CM | POA: Diagnosis not present

## 2020-09-20 LAB — BASIC METABOLIC PANEL
Anion gap: 9 (ref 5–15)
BUN: 21 mg/dL — ABNORMAL HIGH (ref 6–20)
CO2: 22 mmol/L (ref 22–32)
Calcium: 9.3 mg/dL (ref 8.9–10.3)
Chloride: 109 mmol/L (ref 98–111)
Creatinine, Ser: 0.58 mg/dL — ABNORMAL LOW (ref 0.61–1.24)
GFR, Estimated: >60 mL/min (ref >60)
Glucose, Bld: 116 mg/dL — ABNORMAL HIGH (ref 70–99)
Potassium: 3.5 mmol/L (ref 3.5–5.1)
Sodium: 140 mmol/L (ref 135–145)

## 2020-09-20 LAB — GLUCOSE, CAPILLARY
Glucose-Capillary: 110 mg/dL — ABNORMAL HIGH (ref 70–99)
Glucose-Capillary: 117 mg/dL — ABNORMAL HIGH (ref 70–99)
Glucose-Capillary: 79 mg/dL (ref 70–99)
Glucose-Capillary: 93 mg/dL (ref 70–99)
Glucose-Capillary: 96 mg/dL (ref 70–99)
Glucose-Capillary: 98 mg/dL (ref 70–99)

## 2020-09-20 MED ORDER — SODIUM CHLORIDE 0.9 % IV SOLN
0.8000 mg/kg/h | INTRAVENOUS | Status: DC
  Administered 2020-09-20: 1 mg/kg/h via INTRAVENOUS
  Administered 2020-09-21: 0.8 mg/kg/h via INTRAVENOUS
  Filled 2020-09-20 (×4): qty 12.5

## 2020-09-20 MED ORDER — FUROSEMIDE 10 MG/ML IJ SOLN
40.0000 mg | Freq: Once | INTRAMUSCULAR | Status: AC
  Administered 2020-09-20: 40 mg via INTRAVENOUS
  Filled 2020-09-20: qty 4

## 2020-09-20 NOTE — Progress Notes (Signed)
First attempt at ATC (40%)- tolerating well at this time. RN aware.

## 2020-09-20 NOTE — Progress Notes (Signed)
NAME:  Joseph Ayala, MRN:  [id number], DOB:  March 16, 1966, LOS: 23 ADMISSION DATE:  08/27/2020, CONSULTATION DATE:  08/28/20 REFERRING MD:  Darliss Cheney, MD CHIEF COMPLAINT:  Acute hypoxemic respiratory failure  Brief History:  55 year old male on Suboxone for necrotizing myopathy admitted 2/19 with acute hypoxemic respiratory failure secondary to MRSA community-acquired pneumonia / aspiration. PCCM initially consulted for evaluation for bronchoscopy for possible mucous plugging. Transferred to ICU after worsening respiratory failure and mental status requiring intubation on 2/21. ICU course complicated by hypotension requiring pressors, chest tube placement for empyema s/p intrapleural lytics. Trach 3/9.   Hx of poorly explained myopathy, self medicating, pulse steroids / methotrexate.    Past Medical History:  Hypertension, necrotizing myopathy, opioid use on Suboxone  Significant Hospital Events:  2/19 Arrived to ED for cough and chest pain x 1 week 2/20 Admitted to [facility] in early am. Intubated. Bronch 2/22 Worsening respiratory status and vasopressor requirement. Agitation on vent 2/23 Improving pressor requirement 2/24 Off pressors. Right chest tube placement. Propofol changed versed as TRG increased 2/25 About 650 out of CT since insertion. Stable vent requirements. Sedation and agitation a challenge / librium added. Fever. TPA into chest tube 2/26 FOB for mucus plugging 2/27 Briefly on neo overnight / thought sedation related  2/28 Repeat CT with little residual pleural fluid, largely consolidated lung. On vent. Oral oxy added.  3/01 Ceiling of IV gtt's reduced, weaning on PSV 10/5 - weaned for 8h. Diuresis.   3/02 Sedation changed, hypotension, levophed initiated. FOB with BAL, chest tube removed.   3/04 Discontinue aline, PICC placed LUE 3/05 Increase oral oxy, wean dilaudid 3/06 Improved fever curve on linezolid, challenging sedation 3/07 Difficulties with sedation > changed back  to versed, dilaudid. Oral methadone added.  3/08 R white out on CXR / mucus plugging. Methadone stopped, re-challenge with ketamine. FOB with BAL.  3/09 Calmer on ketamine at higher dose, on propofol 20 mcg, dilaudid 26m 3/10 Propofol stopped overnight, restarted due to agitation. Ketamine increased. Propofol stopped during day. 3/11 Remains on ketamine, dilaudid down to 154m/ weaning  3/12 added clonidine to help with dilaudid wean completely off > drip stopped and still needed prn for HBP 3/12 added reglan 5 mg IV q6h  for emesis  3/13 Still requiring hydralazine for hbp despite clonidine 0.2 mg tid with pulse near 100 Started reglan p emesis 3/13 > resolved and ready to restart tf No supplemental dilaudid overnight  3/14 added librium taper. Dec ketamine to 1.2  3/15 ketamine dec to 1. On ATC looks comfortable  Consults:  TRH PCCM ID  Procedures:  ETT 2/21 >> 3/9 R Radial Aline 2/20 >> 3/4  L IJ TLC 2/21 >> 3/4 R CT 2/24 >> 3/2 LUE PICC 3/4 >>  TrLurline IdolBJanace Hoard3/9 >>   Significant Diagnostic Tests:   CTA Chest 2/19 >> right sided consolidation and ground glass opacities in the right and middle lobe. Background centrilobular emphysema present. No pulmonary embolism  CT CAP 2/24 >> Interval enlargement of right pleural effusion with some loculation. Progression of right airspace disease with cavitation in right middle/lower lobe. Anasarca  CT Chest w/o 2/28 >> no large amount of residual pleural fluid present with most of the low-density abnormality in the right mid to lower chest felt to represent necrotic and consolidated RLL, RML. There is still some fluid component in the posterior pleural space superior to the tip of the indwelling chest tube  CT Chest 3/8 >> persistent area of  RML, RLL airspace disease with decreasing cavitation and increasing consolidation since prior study, increased airspace disease throughout the aerated RUL, consistent with progressive PNA. Stable LLL  consolidation, trace L pleural effusion   TEE 3/11 >> EF 45-50%  No veg/ L to R ASD   Micro Data:  Blood culture 2/19 >> negative  BAL resp cx 2/20 >> MRSA BAL fungal cx 2/20 >> candida albicans BAL AFB cx 2/20 >> no AFB on smear >>  Urine strep ag 2/21 >> neg Urine legionella ag 2/21 >> neg BAL 2/26 >> MRSA  BAL 3/2 >> few MRSA   BAL 3/8 >> rare staph aureus >>  Rare MRSA   Antimicrobials:  Ceftriaxone 2/20 >> 2/21 Azithro 2/20 >> 2/21 Vanc 2/21 >> 3/4  Zosyn 2/21 >> 2/23 Linezolid 3/4 >> 3/8  Ceftaroline 3/8 >>    Interim History / Subjective:  Looks comfortable   Objective   Blood pressure (Abnormal) 139/98, pulse 96, temperature 99.68 F (37.6 C), resp. rate (Abnormal) 22, height _0  (1.803 m), weight 86.6 kg, SpO2 100 %.    Vent Mode: PRVC FiO2 (%):  [30 %-40 %] 40 % Set Rate:  [15 bmp-16 bmp] 16 bmp Vt Set:  [600 mL] 600 mL PEEP:  [5 cmH20] 5 cmH20 Pressure Support:  [8 cmH20] 8 cmH20 Plateau Pressure:  [13 cmH20-18 cmH20] 13 cmH20   Intake/Output Summary (Last 24 hours) at 09/20/2020 0847 Last data filed at 09/20/2020 0550 Gross per 24 hour  Intake 4675.58 ml  Output 5760 ml  Net -1084.42 ml   Filed Weights   09/18/20 0500 09/19/20 0439 09/20/20 0500  Weight: 91.3 kg 90.1 kg 86.6 kg   Physical Exam: General 55 year old male. He is resting in bed HENT NCAT no JVD. The trach site does have some early concern for ulceration below the level of the trach flange pulm scattered rhonchi. Now on 40% ATC Card rrr abd soft tol TF Ext warm and dry trace LE edema GU clear yellow        Resolved Hospital Problem list    Assessment & Plan:Elevated LFTs - likely combination of hx and congestive hepatopathy AKI Aspiration pneumonia/pneumonitis in setting of altered mental status Septic shock (off pressors as of 2/24) Cardiogenic shock  Demand ischemia Parapneumonic Effusion versus Empyema:S/p chest tube placement 2/24, tPA 2/25, 2/26.  Chest tube removed  3/2.  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 Plan Day 8 Ceftaroline day 25 abx as directed by ID VAP bundle PAD protocol goal RASS 0 to -1 Intermittent cxr ATC during day will plan on rest at HS  Lasix again today    New onset HFrEF -follow-up TEE on 3/11 showed improvement of LVEF 45 to 50% echocardiogram was negative for vegetation/infectious endocarditis Thoracic Aneurysm - will need outpatient follow up  Plan Will need cardiology to see again as he gets closer to dc  Atrial fibrillation Plan Continuing Lovenox full dose  Continue Lopressor and dc amio  Continue telemetry monitoring     Acute Metabolic Encephalopathy / Agitated Delirium  Significant Alcohol use and Narcotic Dependence Concern for withdrawal and sepsis -significant improvement on ketamine, ceiling of 72m/kg/hr as needed for sedation  Plan Holding Suboxone  Continue clonidine 0.2 mg via tube 3 times a day  Continue Seroquel 100 mg via tube twice a day  Currently on Librium taper, suspect we  will need to place on a chronic dose for some time  Continue to decrease ketamine 0.2 mg an hour every 24 hours, I discussed this with palliative care   Fluid and electrolyte imbalance: Intermittent Plan Trend replace as indicated  Moderate Protein Calorie Malnutrition  Plan Tube feeds  Macrocytic Anemia  Plan Continue vitamin D replacement  Trend CBC  Transfusion trigger for hemoglobin less than 7   Best practice (evaluated daily)  Diet: TF Pain/Anxiety/Delirium protocol (if indicated): PAD goal 0 to -1 VAP protocol (if indicated): Yes DVT prophylaxis: Lovenox, treatment dose GI prophylaxis: Protonix Glucose control: CBG q4h Mobility: As tolerated Disposition: ICU  Goals of Care:  Last date of multidisciplinary goals of care discussion:  Family and  staff present:  Summary of discussion:  Follow up goals of care discussion due: 3/14 Code Status: Full code  Family:Wife  updated in detail 3/12.    My cct 27 min  Erick Colace ACNP-BC [place] Pager # [phone] OR # [phone] if no answer

## 2020-09-20 NOTE — Progress Notes (Signed)
Assessed PT at approximately 0950. PT continues to tolerate ATC well at this time (Sp02 98%, HR 90, RR 25, RN states she just suctioned PT trach). RN is at bedside.

## 2020-09-21 ENCOUNTER — Inpatient Hospital Stay (HOSPITAL_COMMUNITY): Payer: Medicare HMO

## 2020-09-21 DIAGNOSIS — J850 Gangrene and necrosis of lung: Secondary | ICD-10-CM | POA: Diagnosis not present

## 2020-09-21 LAB — CBC
HCT: 31.5 % — ABNORMAL LOW (ref 39.0–52.0)
Hemoglobin: 9.8 g/dL — ABNORMAL LOW (ref 13.0–17.0)
MCH: 30.2 pg (ref 26.0–34.0)
MCHC: 31.1 g/dL (ref 30.0–36.0)
MCV: 97.2 fL (ref 80.0–100.0)
Platelets: 444 10*3/uL — ABNORMAL HIGH (ref 150–400)
RBC: 3.24 MIL/uL — ABNORMAL LOW (ref 4.22–5.81)
RDW: 16.3 % — ABNORMAL HIGH (ref 11.5–15.5)
WBC: 14.6 10*3/uL — ABNORMAL HIGH (ref 4.0–10.5)
nRBC: 0 % (ref 0.0–0.2)

## 2020-09-21 LAB — GLUCOSE, CAPILLARY
Glucose-Capillary: 112 mg/dL — ABNORMAL HIGH (ref 70–99)
Glucose-Capillary: 104 mg/dL — ABNORMAL HIGH (ref 70–99)
Glucose-Capillary: 125 mg/dL — ABNORMAL HIGH (ref 70–99)
Glucose-Capillary: 91 mg/dL (ref 70–99)
Glucose-Capillary: 108 mg/dL — ABNORMAL HIGH (ref 70–99)
Glucose-Capillary: 108 mg/dL — ABNORMAL HIGH (ref 70–99)

## 2020-09-21 LAB — BASIC METABOLIC PANEL
Anion gap: 10 (ref 5–15)
BUN: 28 mg/dL — ABNORMAL HIGH (ref 6–20)
CO2: 22 mmol/L (ref 22–32)
Calcium: 9.4 mg/dL (ref 8.9–10.3)
Chloride: 110 mmol/L (ref 98–111)
Creatinine, Ser: 0.59 mg/dL — ABNORMAL LOW (ref 0.61–1.24)
GFR, Estimated: >60 mL/min (ref >60)
Glucose, Bld: 115 mg/dL — ABNORMAL HIGH (ref 70–99)
Potassium: 3.6 mmol/L (ref 3.5–5.1)
Sodium: 142 mmol/L (ref 135–145)

## 2020-09-21 LAB — MAGNESIUM: Magnesium: 2.3 mg/dL (ref 1.7–2.4)

## 2020-09-21 IMAGING — DX DG CHEST 1V PORT
1 series · 1 of 1 positions shown · non-contrast
Comparison: [DATE]

CLINICAL DATA: Respiratory failure

EXAM:
PORTABLE CHEST 1 VIEW

[chest ap]
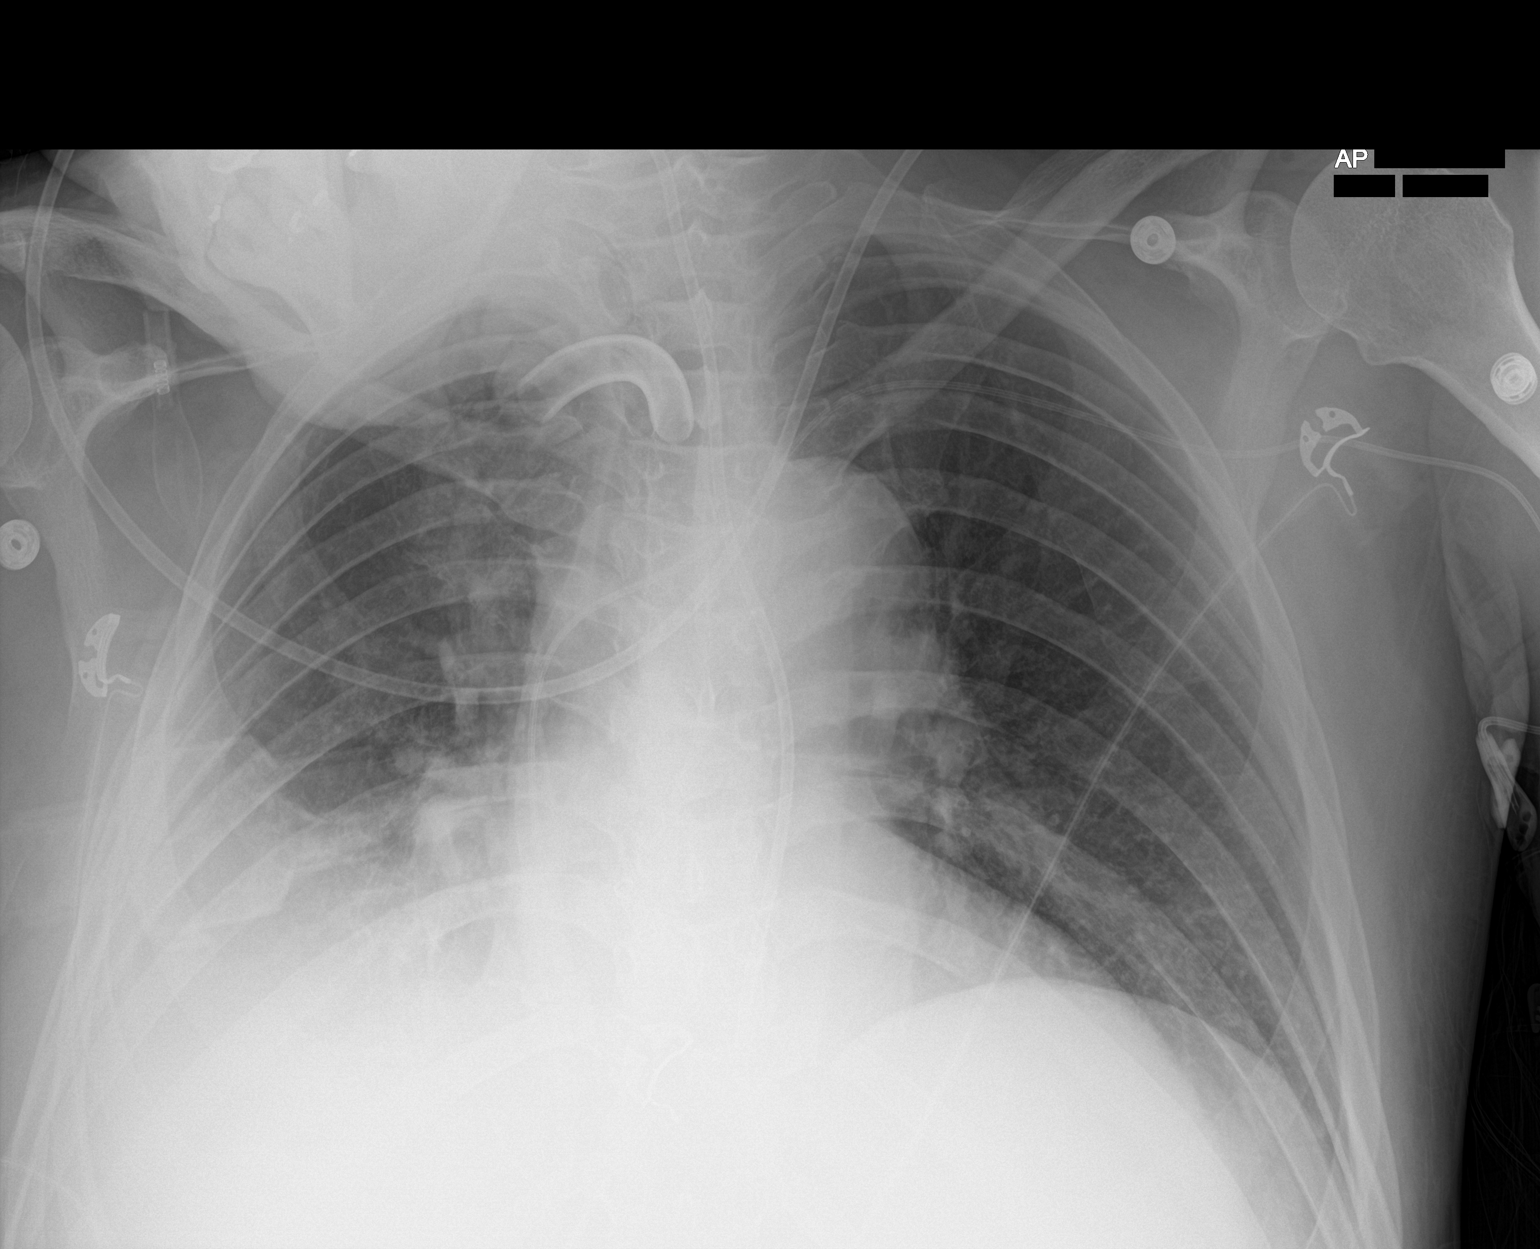

[1 of 1 positions shown; findings below may reference images not displayed]

FINDINGS: Tracheostomy catheter tip is 6.0 cm above the carina. Enteric tube
tip is below the diaphragm. No pneumothorax. There is a small right
pleural effusion with right base atelectasis. Lungs elsewhere clear.
Heart is upper normal in size with pulmonary vascularity normal. No
adenopathy. No bone lesions.
IMPRESSION: Tube positions as described without pneumothorax. Small right
pleural effusion with right base atelectasis. Lungs elsewhere clear.
Heart upper normal in size.

## 2020-09-21 MED ORDER — SODIUM CHLORIDE 0.9 % IV SOLN
0.3000 mg/kg/h | INTRAVENOUS | Status: DC
  Administered 2020-09-22 – 2020-09-23 (×3): 1 mg/kg/h via INTRAVENOUS
  Administered 2020-09-24 – 2020-09-25 (×2): 0.8 mg/kg/h via INTRAVENOUS
  Administered 2020-09-26: 0.5 mg/kg/h via INTRAVENOUS
  Filled 2020-09-21 (×12): qty 12.5

## 2020-09-21 MED ORDER — LOPERAMIDE HCL 1 MG/7.5ML PO SUSP
2.0000 mg | ORAL | Status: AC | PRN
  Filled 2020-09-21 (×2): qty 30

## 2020-09-21 MED ORDER — ENOXAPARIN SODIUM 100 MG/ML ~~LOC~~ SOLN
90.0000 mg | Freq: Two times a day (BID) | SUBCUTANEOUS | Status: DC
  Administered 2020-09-21: 90 mg via SUBCUTANEOUS
  Filled 2020-09-21 (×2): qty 0.9

## 2020-09-21 MED ORDER — ONDANSETRON HCL 4 MG PO TABS: 4.0000 mg | ORAL_TABLET | Freq: Four times a day (QID) | ORAL | Status: AC | PRN

## 2020-09-21 MED ORDER — FUROSEMIDE 10 MG/ML IJ SOLN
40.0000 mg | Freq: Once | INTRAMUSCULAR | Status: AC
  Administered 2020-09-21: 40 mg via INTRAVENOUS
  Filled 2020-09-21: qty 4

## 2020-09-21 MED ORDER — SODIUM CHLORIDE 0.9 % IV SOLN
1.0000 mg/kg/h | INTRAVENOUS | Status: DC
  Filled 2020-09-21: qty 5

## 2020-09-21 NOTE — Evaluation (Signed)
Occupational Therapy Evaluation Patient Details Name: Joseph Ayala MRN: [id number] DOB: 1965/10/07 Today's Date: 09/21/2020    History of Present Illness 55 year old male on Suboxone for necrotizing myopathy who presents with acute hypoxemic respiratory failure secondary to community-acquired pneumonia/aspiration. PCCM initially consulted for evaluation for bronchoscopy for possible mucous plugging. Transferred to ICU after worsening respiratory failure and mental status requiring intubation on 2/21. Tracheostomy 3/9.   Clinical Impression   [name] is a 55 year old man normally independent and active, per his wife, who presents today with significant deficits including generalized weakness, impaired balance, impaired cognition, poor activity tolerance and impaired cardiopulmonary endurance. On evaluation patient needed active assist to raise shoulders but able to grossly move elbow, wrist and fingers. Patient max assist to transfer to edge of bed, max assist to power up from from bed and mod x 2 to pivot to recliner. Patient able to assist with maintaining balance at edge of bed. Patient's vital signs stable during evaluation and patients on 5 L 28% trach collar. Patient perseverates on being thirsty but able to follow most commands with tactile cues. Patient has poor attention span and  decreased awareness. Patient max assist to total assist for ADLs. Patient will benefit from skilled OT services while in hospital to improve deficits and learn compensatory strategies as needed in order to return to  [facility].   Patient will need aggressive therapy at discharge.    Follow Up Recommendations  CIR    Equipment Recommendations  None recommended by OT    Recommendations for Other Services       Precautions / Restrictions Precautions Precautions: Fall Restrictions Weight Bearing Restrictions: No      Mobility Bed Mobility Overal bed mobility: Needs Assistance Bed Mobility: Supine to  Sit     Supine to sit: Max assist     General bed mobility comments: MAX assist +2 for B LEs and trunk to upright. Pt able to move B LEs intermittently toward EOB.    Transfers Overall transfer level: Needs assistance Equipment used: 2 person hand held assist Transfers: Sit to/from [employer] Sit to Stand: Mod assist;Max assist;+2 physical assistance;+2 safety/equipment Stand pivot transfers: Mod assist;+2 physical assistance;+2 safety/equipment       General transfer comment: MOD-MAX assist +2 for power up to stand. Pt required MOD assist +2 for stand pivot to chair with good LE activation and controlled descent to chair.    Balance Overall balance assessment: Needs assistance Sitting-balance support: Feet supported;Bilateral upper extremity supported Sitting balance-Leahy Scale: Poor Sitting balance - Comments: pt demonstrated good trunk control with ability to maintain sitting balance with B UE support demonstrating slupmed posture when fatigued requiring MIN assist and verbal cues to correct. Pt was able to perform lateral elbow lean with assist and sustain balance without support form therapist and was able to pull back to midline.   Standing balance support: Bilateral upper extremity supported Standing balance-Leahy Scale: Poor                             ADL either performed or assessed with clinical judgement   ADL Overall ADL's : Needs assistance/impaired Eating/Feeding: NPO   Grooming: Set up;Wash/dry face;Sitting;Maximal assistance   Upper Body Bathing: Maximal assistance;Sitting   Lower Body Bathing: Total assistance   Upper Body Dressing : Maximal assistance;Sitting   Lower Body Dressing: Total assistance;Bed level Lower Body Dressing Details (indicate cue type and reason): to don  socks Toilet Transfer: Maximal assistance;Stand-pivot;+2 for physical assistance;+2 for safety/equipment;BSC   Toileting- Clothing Manipulation and  Hygiene: Total assistance Toileting - Clothing Manipulation Details (indicate cue type and reason): rectal tube and catheter             Vision   Vision Assessment?: No apparent visual deficits     Perception     Praxis      Pertinent Vitals/Pain Pain Assessment: Faces Faces Pain Scale: Hurts a little bit Pain Location: generalized Pain Descriptors / Indicators: Grimacing Pain Intervention(s): Monitored during session     Hand Dominance     Extremity/Trunk Assessment Upper Extremity Assessment Upper Extremity Assessment: RUE deficits/detail;LUE deficits/detail RUE Deficits / Details: AAROM for shoulder flexion, grossly able to flex elbow and extend, grossly able to flex and extend wrist, able to open and close fingers. Shoulder 3-/5 strength, bicep 3/5, tricep 3=/5 tricep, wrist 3/5, hand 3/5 RUE Coordination: decreased fine motor;decreased gross motor LUE Deficits / Details: AAROM for shoulder flexion, grossly able to flex elbow and extend, grossly able to flex and extend wrist, able to open and close fingers. Shoulder 2+/5 strength, elbow 3/5, wrist 3/5, hand 3/5 LUE Coordination: decreased fine motor;decreased gross motor   Lower Extremity Assessment Lower Extremity Assessment: Defer to PT evaluation RLE Deficits / Details: pt able to perform B AROM dorsi/plantar flexion. Pt with limited AROM knee flexion, therapist able to achieve full ROM with AAROM.Pt was unable to clear Rt LE from bed with SLR  . Pt unable to perform AROM hip ABD, able to achieve passively. LLE Deficits / Details: AROM knee flexin on L with limited ROM. Pt was able to perform limited SLR with no eccentric lowering ocntrol.   Cervical / Trunk Assessment Cervical / Trunk Assessment: Normal   Communication Communication Communication: Tracheostomy (t-collar)   Cognition Arousal/Alertness: Awake/alert Behavior During Therapy: WFL for tasks assessed/performed Overall Cognitive Status: Difficult to  assess                                     General Comments  pt on 5L (28%) trach collar. Pt HR remained in 80-90s throughout session and lowest 02 reading 88% while EOB with quick recovery to 93%.    Exercises     Shoulder Instructions      Home Living Family/patient expects to be discharged to:: Private residence Living Arrangements: Spouse/significant other Available Help at Discharge: Family Type of Home: House Home Access: Stairs to enter Secretary/administrator of Steps: 2 Entrance Stairs-Rails: None Home Layout: One level               Home Equipment: None   Additional Comments: pt's wife is a Engineer, civil (consulting) at [employer] and is available to assist. All home living information provided by wife who was present during session.      Prior Functioning/Environment Level of Independence: Independent                 OT Problem List: Decreased strength;Decreased range of motion;Decreased activity tolerance;Impaired balance (sitting and/or standing);Decreased safety awareness;Decreased cognition;Decreased coordination;Decreased knowledge of use of DME or AE;Pain;Impaired UE functional use;Increased edema;Cardiopulmonary status limiting activity      OT Treatment/Interventions: Self-care/ADL training;Therapeutic exercise;DME and/or AE instruction;Therapeutic activities;Balance training;Patient/family education    OT Goals(Current goals can be found in the care plan section) Acute Rehab OT Goals Patient Stated Goal: get stronger OT Goal Formulation: With family Time For Goal Achievement:  10/05/20 Potential to Achieve Goals: Good  OT Frequency: Min 2X/week   Barriers to D/C:            Co-evaluation PT/OT/SLP Co-Evaluation/Treatment: Yes Reason for Co-Treatment: For patient/therapist safety;Complexity of the patient's impairments (multi-system involvement);To address functional/ADL transfers          AM-PAC OT "6 Clicks" Daily Activity     Outcome Measure  Help from another person eating meals?: Total Help from another person taking care of personal grooming?: A Little Help from another person toileting, which includes using toliet, bedpan, or urinal?: Total Help from another person bathing (including washing, rinsing, drying)?: A Lot Help from another person to put on and taking off regular upper body clothing?: A Lot Help from another person to put on and taking off regular lower body clothing?: Total 6 Click Score: 10   End of Session Equipment Utilized During Treatment: Oxygen Nurse Communication: Mobility status  Activity Tolerance: Patient tolerated treatment well Patient left: in chair;with call bell/phone within reach;with family/visitor present  OT Visit Diagnosis: Unsteadiness on feet (R26.81);Other abnormalities of gait and mobility (R26.89);Muscle weakness (generalized) (M62.81)                Time: 1884-1660 OT Time Calculation (min): 40 min Charges:  OT General Charges $OT Visit: 1 Visit OT Evaluation $OT Eval High Complexity: 1 High  Madelina Sanda, OTR/L Acute Care Rehab Services  Office [phone] Pager: [phone]   Kelli Churn 09/21/2020, 3:23 PM

## 2020-09-21 NOTE — Progress Notes (Signed)
NAME:  Joseph Ayala, MRN:  [id number], DOB:  August 01, 1965, LOS: 24 ADMISSION DATE:  08/27/2020, CONSULTATION DATE:  08/28/20 REFERRING MD:  Darliss Cheney, MD CHIEF COMPLAINT:  Acute hypoxemic respiratory failure  Brief History:  55 year old male on Suboxone for necrotizing myopathy admitted 2/19 with acute hypoxemic respiratory failure secondary to MRSA community-acquired pneumonia / aspiration. PCCM initially consulted for evaluation for bronchoscopy for possible mucous plugging. Transferred to ICU after worsening respiratory failure and mental status requiring intubation on 2/21. ICU course complicated by hypotension requiring pressors, chest tube placement for empyema s/p intrapleural lytics. Trach 3/9.   Hx of poorly explained myopathy, self medicating, pulse steroids / methotrexate.    Past Medical History:  Hypertension, necrotizing myopathy, opioid use on Suboxone  Significant Hospital Events:  2/19 Arrived to ED for cough and chest pain x 1 week 2/20 Admitted to [facility] in early am. Intubated. Bronch 2/22 Worsening respiratory status and vasopressor requirement. Agitation on vent 2/23 Improving pressor requirement 2/24 Off pressors. Right chest tube placement. Propofol changed versed as TRG increased 2/25 About 650 out of CT since insertion. Stable vent requirements. Sedation and agitation a challenge / librium added. Fever. TPA into chest tube 2/26 FOB for mucus plugging 2/28 Repeat CT with little residual pleural fluid, largely consolidated lung. On vent. Oral oxy added.  3/01 Ceiling of IV gtt's reduced, weaning on PSV 10/5 - weaned for 8h. Diuresis.   3/02 Sedation changed, hypotension, levophed initiated. FOB with BAL, chest tube removed.   3/04 Discontinue aline, PICC placed LUE 3/06 Improved fever curve on linezolid, challenging sedation 3/07 Difficulties with sedation > changed back to versed, dilaudid. Oral methadone added.  3/08 R white out on CXR / mucus plugging. Methadone  stopped, re-challenge with ketamine. FOB with BAL.  3/09 Calmer on ketamine at higher dose, on propofol 20 mcg, dilaudid 55m Ketamine increased. Propofol stopped during day. 3/11 Remains on ketamine, dilaudid down to 158m/ weaning  3/12 added clonidine to help with dilaudid wean completely off  drip stopped and still needed prn for HBP 3/12 added reglan 5 mg IV q6h  for emesis  Started reglan p emesis 3/13 > resolved and ready to restart tf No supplemental dilaudid overnight  3/14 added librium taper. Dec ketamine to 1.2  3/15 ketamine dec to 1. On ATC looks comfortable   Consults:  TRH PCCM ID  Procedures:  ETT 2/21 >> 3/9 R Radial Aline 2/20 >> 3/4  L IJ TLC 2/21 >> 3/4 R CT 2/24 >> 3/2 LUE PICC 3/4 >>  TrLurline IdolBJanace Hoard3/9 >>   Significant Diagnostic Tests:   CTA Chest 2/19 >> right sided consolidation and ground glass opacities in the right and middle lobe. Background centrilobular emphysema present. No pulmonary embolism  CT CAP 2/24 >> Interval enlargement of right pleural effusion with some loculation. Progression of right airspace disease with cavitation in right middle/lower lobe. Anasarca  CT Chest w/o 2/28 >> no large amount of residual pleural fluid present with most of the low-density abnormality in the right mid to lower chest felt to represent necrotic and consolidated RLL, RML. There is still some fluid component in the posterior pleural space superior to the tip of the indwelling chest tube  CT Chest 3/8 >> persistent area of RML, RLL airspace disease with decreasing cavitation and increasing consolidation since prior study, increased airspace disease throughout the aerated RUL, consistent with progressive PNA. Stable LLL consolidation, trace L pleural effusion   TEE 3/11 >> EF 45-50%  No veg/ L to R ASD   Micro Data:  Blood culture 2/19 >> negative  BAL resp cx 2/20 >> MRSA BAL fungal cx 2/20 >> candida albicans BAL AFB cx 2/20 >> no AFB on smear >>  Urine strep  ag 2/21 >> neg Urine legionella ag 2/21 >> neg BAL 2/26 >> MRSA  BAL 3/2 >> few MRSA   BAL 3/8 >> rare staph aureus >>  Rare MRSA   Antimicrobials:  Ceftriaxone 2/20 >> 2/21 Azithro 2/20 >> 2/21 Vanc 2/21 >> 3/4  Zosyn 2/21 >> 2/23 Linezolid 3/4 >> 3/8  Ceftaroline 3/8 >>    Interim History / Subjective:  Tolerated 8 hrs ATC  Objective   Blood pressure 131/86, pulse 87, temperature 99.5 F (37.5 C), resp. rate (Abnormal) 21, height _0  (1.803 m), weight 86.6 kg, SpO2 96 %.    Vent Mode: PRVC FiO2 (%):  [30 %-35 %] 30 % Set Rate:  [16 bmp] 16 bmp Vt Set:  [600 mL] 600 mL PEEP:  [5 cmH20] 5 cmH20 Plateau Pressure:  [14 cmH20-18 cmH20] 14 cmH20   Intake/Output Summary (Last 24 hours) at 09/21/2020 0910 Last data filed at 09/21/2020 0802 Gross per 24 hour  Intake 3234.5 ml  Output 3775 ml  Net -540.5 ml   Filed Weights   09/18/20 0500 09/19/20 0439 09/20/20 0500  Weight: 91.3 kg 90.1 kg 86.6 kg   Physical Exam:  General this is a 55 year old male he is resting in bed. Not in acute distress.  HENT NCAT no JVD trach site w/ some old bloody drainage Pulm bilateral rhonchi. No accessory use Card rrr abd soft still has liq stools Ext warm and dry  Neuro awake and calm.  gu cl yellow        Resolved Hospital Problem list    Assessment & Plan:Elevated LFTs - likely combination of hx and congestive hepatopathy AKI Aspiration pneumonia/pneumonitis in setting of altered mental status Septic shock (off pressors as of 2/24) Cardiogenic shock  Demand ischemia Parapneumonic Effusion versus Empyema:S/p chest tube placement 2/24, tPA 2/25, 2/26.  Chest tube removed 3/2.  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 Plan Day 9 Ceftaroline day 26 as directed by ID VAP bundle PAD protocol RASS goal 0 to -1 ATC daily and continue  as tolerated w/ vent in room  Lasix today (will cont daily as able)   New onset HFrEF -follow-up TEE on 3/11 showed improvement of LVEF 45 to 50% echocardiogram was negative for vegetation/infectious endocarditis Thoracic Aneurysm - will need outpatient follow up  Plan Will call cards again as he gets closer to dc   Atrial fibrillation Plan Cont LMWH Cont lopressor  tele    Acute Metabolic Encephalopathy / Agitated Delirium  Significant Alcohol use and Narcotic Dependence Concern for withdrawal and sepsis -significant improvement on ketamine, ceiling of 49m/kg/hr as needed for sedation  Plan Holding suboxone Cont clonidine 0.2 mg via tube tid Cont Seroquel 1089mbid Librium taper  Cont clonazepam  Cont to dec ketamine 0.2 mg every 24 hrs   Fluid and electrolyte imbalance: Intermittent Plan Trend and replace   Moderate Protein Calorie Malnutrition  Plan tubefeeds  Macrocytic Anemia  Plan Cont vit D Trend cbc Transfusion trigger 7   Best practice (evaluated daily)  Diet: TF Pain/Anxiety/Delirium protocol (if indicated): PAD goal 0 to -1 VAP protocol (if indicated):  Yes DVT prophylaxis: Lovenox, treatment dose GI prophylaxis: Protonix Glucose control: CBG q4h Mobility: As tolerated Disposition: ICU  Goals of Care:  Last date of multidisciplinary goals of care discussion:  Family and staff present:  Summary of discussion:  Follow up goals of care discussion due: 3/23 Code Status: Full code  Family:Wife  updated in detail 3/12.    My cct 8 min   Erick Colace ACNP-BC [place] Pager # [phone] OR # [phone] if no answer

## 2020-09-21 NOTE — Progress Notes (Signed)
Inpatient Rehab Admissions Coordinator Note:   Per therapy recommendations, pt was screened for CIR candidacy by Estill Dooms, PT, DPT.  At this time note pt still require MV overnight, and febrile with tmax 100.9.  Would recommend a CIR consult once pt able to tolerate trach collar x24 hours.  Please contact me with questions.   Estill Dooms, PT, DPT [phone] 09/21/20 3:28 PM

## 2020-09-21 NOTE — Evaluation (Addendum)
Physical Therapy Evaluation Patient Details Name: Hugh Kamara Pastorino MRN: [id number] DOB: 1965-11-12 Today's Date: 09/21/2020  Clinical Impression: Pt on 5L (28%) trach collar. Pt is a 55 y.o. male with below HPI. Pt's wife reports that he is independent with mobility at baseline. Pt required MAX assist +2 for progression of B LEs and trunk to upright for supine to sit transfer. Pt required MOD-MAX assist +2 to initiate power up to stand and MOD assist +2 for stand pivot to recliner. Pt's HR in 80's-90's and lowest 02 sat 88% with quick recovery to 93% during mobility. Recommend CIR upon discharge to maximize functional mobility and independence to return to PLOF. Pt is highly motivated and participatory in therapy session and tolerate activity well. Pt will have assist from his wife at home. Acute therapy to follow up during stay to progress functional mobility as able.    09/21/20 1200  PT Visit Information  Last PT Received On 09/21/20  Assistance Needed +2  PT/OT/SLP Co-Evaluation/Treatment Yes  Reason for Co-Treatment For patient/therapist safety;To address functional/ADL transfers  PT goals addressed during session Balance;Mobility/safety with mobility;Strengthening/ROM  History of Present Illness Patient is a 55 y.o. male s/p tracheotomy on 09/14/2020 who presented to the ED with complaints of chest pain, cough, and SOB admitted for acute hypoxemic respiratory failure secondary to MRSA community-acquired pneumonia / aspiration with PMH significant for aortic aneurysm, HTN,  ETOH abuse, and opioid use.  Precautions  Precautions Fall  Restrictions  Weight Bearing Restrictions No  Home Living  Family/patient expects to be discharged to: Private residence  Living Arrangements Spouse/significant other  Available Help at Discharge Family  Type of Home House  Home Access Stairs to enter  Entrance Stairs-Number of Steps 2  Entrance Stairs-Rails None  Home Layout One level  Home Equipment None   Additional Comments pt's wife is a Engineer, civil (consulting) at [employer] and is available to assist. All home living information provided by wife who was present during session.  Prior Function  Level of Independence Independent  Communication  Communication Tracheostomy (t-collar; pt mouthing words)  Pain Assessment  Pain Assessment Faces  Faces Pain Scale 2  Pain Location generalized  Pain Descriptors / Indicators Grimacing  Pain Intervention(s) Monitored during session;Limited activity within patient's tolerance;Repositioned  Cognition  Arousal/Alertness Awake/alert  Behavior During Therapy WFL for tasks assessed/performed  Overall Cognitive Status Difficult to assess  General Comments pt's wife reports not ate baseline for awareness or following commands.  Difficult to assess due to Tracheostomy  Upper Extremity Assessment  Upper Extremity Assessment Defer to OT evaluation  Lower Extremity Assessment  Lower Extremity Assessment RLE deficits/detail;LLE deficits/detail;Generalized weakness  RLE Deficits / Details pt able to perform B AROM dorsi/plantar flexion. Pt with limited AROM knee flexion, therapist able to achieve full ROM with AAROM. Pt was unable to clear Rt LE from bed with SLR . Pt unable to perform AROM hip ABD, able to achieve passively. Grossly 2+/5 or less for LE strength.  LLE Deficits / Details pt able to perform AROM knee flexion on L with limited ROM. Pt was able to perform limited SLR and displayed no eccentric lowering control. Grossly 2+/5 or less for LE strength.  Cervical / Trunk Assessment  Cervical / Trunk Assessment Normal;Other exceptions  Cervical / Trunk Exceptions pt with difficulty maintaining cervical extension for upright posture. Head resting in Rt flexion while sitting. Able to lift upa nd correct but not maintain.  Bed Mobility  Overal bed mobility Needs Assistance  Bed Mobility  Supine to Sit  Supine to sit Max assist;+2 for safety/equipment;+2 for physical  assistance;HOB elevated  General bed mobility comments MAX assist +2 for B LEs and trunk to upright. Pt able to initiate moving B LEs toward EOB. Bed pad used to assist with pivot/scoot to EOB.  Transfers  Overall transfer level Needs assistance  Equipment used 2 person hand held assist  Transfers Sit to/from [employer] Transfers  Sit to Stand Mod assist;Max assist;+2 physical assistance;+2 safety/equipment  Stand pivot transfers Mod assist;+2 physical assistance;+2 safety/equipment  General transfer comment Pt required assist with Bil hand placement in prep for transfer 2/2 UE weakness. MOD-MAX assist +2 for power up to stand. Pt required MOD assist +2 for stand pivot to chair with good LE activation to maintain knee extension in standing for ~3 seconds. Assist needed to control descent to chair.  Balance  Overall balance assessment Needs assistance  Sitting-balance support Feet supported;Bilateral upper extremity supported  Sitting balance-Leahy Scale Poor  Sitting balance - Comments pt demonstrated good trunk control with ability to maintain sitting balance with B UE support demonstrating slupmed posture when fatigued requiring MIN assist and verbal cues to correct. Pt was able to perform lateral elbow lean with tactile cues to lean Rt/Lt, pt able to sustain balance without support form therapist, able to pull back to midline from Lt lean, assist required from Rt.  Standing balance support Bilateral upper extremity supported  Standing balance-Leahy Scale Poor  Standing balance comment Heavy reliance on 2 Person Max Assist for standing. HIGH Fall Risk.  General Comments  General comments (skin integrity, edema, etc.) pt on 5L (28%) trach collar. Pt HR remained in 80-90s throughout session and lowest 02 reading 88% while EOB with quick recovery to 93%.  PT - End of Session  Equipment Utilized During Treatment Oxygen  Activity Tolerance Patient tolerated treatment well  Patient left in  chair;with call bell/phone within reach;with family/visitor present  Nurse Communication Mobility status (pt restraints left off, wife in room at EOS)  PT Assessment  PT Recommendation/Assessment Patient needs continued PT services  PT Visit Diagnosis Muscle weakness (generalized) (M62.81);Difficulty in walking, not elsewhere classified (R26.2)  PT Problem List Decreased strength;Decreased range of motion;Decreased activity tolerance;Decreased balance;Decreased mobility;Decreased cognition;Decreased knowledge of use of DME  PT Plan  PT Frequency (ACUTE ONLY) Min 4X/week  PT Treatment/Interventions (ACUTE ONLY) DME instruction;Gait training;Stair training;Functional mobility training;Therapeutic activities;Therapeutic exercise;Balance training;Patient/family education;Neuromuscular re-education  AM-PAC PT "6 Clicks" Mobility Outcome Measure (Version 2)  Help needed turning from your back to your side while in a flat bed without using bedrails? 2  Help needed moving from lying on your back to sitting on the side of a flat bed without using bedrails? 2  Help needed moving to and from a bed to a chair (including a wheelchair)? 2  Help needed standing up from a chair using your arms (e.g., wheelchair or bedside chair)? 2  Help needed to walk in hospital room? 1  Help needed climbing 3-5 steps with a railing?  1  6 Click Score 10  Consider Recommendation of Discharge To: CIR/SNF/LTACH  PT Recommendation  Recommendations for Other Services Rehab consult;Speech consult  Follow Up Recommendations CIR  PT equipment None recommended by PT (TBA, defer to next facility)  Individuals Consulted  Consulted and Agree with Results and Recommendations Patient;Family member/caregiver  Family Member Consulted pt's wife  Acute Rehab PT Goals  Patient Stated Goal get stronger  PT Goal Formulation With patient/family  Time For Goal  Achievement 09/28/20  Potential to Achieve Goals Good  PT Time Calculation   PT Start Time (ACUTE ONLY) 1222  PT Stop Time (ACUTE ONLY) 1303  PT Time Calculation (min) (ACUTE ONLY) 41 min  PT General Charges  $$ ACUTE PT VISIT 1 Visit  PT Evaluation  $PT Eval High Complexity 1 High  PT Treatments  $Therapeutic Activity 8-22 mins  Written Expression  Dominant Hand Right     Lauren Youngblood, SPT  Acute rehab    Lauren Youngblood 09/21/2020, 4:00 PM

## 2020-09-21 NOTE — TOC Progression Note (Signed)
Transition of Care [facility]) - Progression Note    Patient Details  Name: Joseph Ayala MRN: [id number] Date of Birth: [birth year]  Transition of [facility]) CM/SW Contact  Golda Acre, RN Phone Number: 09/21/2020, 7:39 AM  Clinical Narrative:    Synopsis of assessment and plan:  Acute Hypoxemic Respiratory Failure: due to MRSA pneumonia and emypema. Chest tube removed. S/p trach. Improving.  --ID following, ceftaroline  --Full vent support at night, PSV/TC trials during day   --VAP bundle    New CHF with reduced EF 35% 08/30/20:EF improved to 45% on TEE 3/11.  --Lasix today  --Appreciate cards assistance- re-engage when less critically ill  --Metop BID, consider afterload reduction coming days    Afib:in NSR currently.  --s/p amio load, d/c PO amioas tolerating BB  --Lovenox SQ for stroke ppx  --Metop 50 BID    Acute metabolic encephalopathy: multifactorial including delirium, sepsis, h/o  substance abuse. Interfering with ventilator wean.  --ketamine infusion per palliative, weaning daily  --clonazepam BID, librium taper, increase seroquel, continue clonidine   PLAN: undetermined at this time ltach following for possible transfer or snf placement. Expected Discharge Plan: Home/Self Care Barriers to Discharge: Continued Medical Work up  Expected Discharge Plan and Services Expected Discharge Plan: Home/Self Care   Discharge Planning Services: CM Consult   Living arrangements for the past 2 months: Single Family Home                                       Social Determinants of Health (SDOH) Interventions    Readmission Risk Interventions No flowsheet data found.

## 2020-09-21 NOTE — Hospital Course (Signed)
I

## 2020-09-21 NOTE — Progress Notes (Deleted)
    Best practice (evaluated daily)   Diet:  NPO  VAP protocol (if indicated): Yes DVT prophylaxis: LMWH GI prophylaxis: H2B Glucose control:  SSI Yes Central venous access:  Yes, and it is still needed Arterial line:  Yes, and it is still needed Foley:  Yes, and it is still needed Mobility:  OOB  PT consulted: PT Consult Last date of multidisciplinary goals of care discussion [3.12] Code Status:  full code Disposition: ss

## 2020-09-21 NOTE — Progress Notes (Signed)
Chaplain engaged in a follow-up visit with Joseph Ayala and his mom yesterday.  Chaplain learned that Joseph Ayala is the youngest of four boys.  His mom detailed how he was always a leader and often had others who would follow what he did or said.  She detailed Joseph Ayala as being very rambunctious.  Mom described what it was like mothering four boys.  [place] that allowed for Joseph Ayala and his brothers to explore nature and be outside often.  They could play in the woods and creek close to their home.    Chaplain also learned that Joseph Ayala's mom is a former NP. Chaplain and mom discussed Joseph Ayala's current state, his life growing up, being mothers, and working in the medical field.  Chaplain offered listening, presence, and support.  Chaplain will continue to follow-up.      09/21/20 0900  Clinical Encounter Type  Visited With Patient and family together  Visit Type Follow-up

## 2020-09-22 ENCOUNTER — Inpatient Hospital Stay (HOSPITAL_COMMUNITY): Payer: Medicare HMO

## 2020-09-22 ENCOUNTER — Other Ambulatory Visit: Payer: Self-pay | Admitting: *Deleted

## 2020-09-22 DIAGNOSIS — J850 Gangrene and necrosis of lung: Secondary | ICD-10-CM | POA: Diagnosis not present

## 2020-09-22 LAB — GLUCOSE, CAPILLARY
Glucose-Capillary: 100 mg/dL — ABNORMAL HIGH (ref 70–99)
Glucose-Capillary: 111 mg/dL — ABNORMAL HIGH (ref 70–99)
Glucose-Capillary: 124 mg/dL — ABNORMAL HIGH (ref 70–99)
Glucose-Capillary: 126 mg/dL — ABNORMAL HIGH (ref 70–99)
Glucose-Capillary: 81 mg/dL (ref 70–99)
Glucose-Capillary: 91 mg/dL (ref 70–99)

## 2020-09-22 LAB — BASIC METABOLIC PANEL
Anion gap: 7 (ref 5–15)
BUN: 28 mg/dL — ABNORMAL HIGH (ref 6–20)
CO2: 22 mmol/L (ref 22–32)
Calcium: 8 mg/dL — ABNORMAL LOW (ref 8.9–10.3)
Chloride: 113 mmol/L — ABNORMAL HIGH (ref 98–111)
Creatinine, Ser: 0.44 mg/dL — ABNORMAL LOW (ref 0.61–1.24)
GFR, Estimated: >60 mL/min (ref >60)
Glucose, Bld: 103 mg/dL — ABNORMAL HIGH (ref 70–99)
Potassium: 3.2 mmol/L — ABNORMAL LOW (ref 3.5–5.1)
Sodium: 142 mmol/L (ref 135–145)

## 2020-09-22 IMAGING — DX DG ABD PORTABLE 1V
1 series · 1 of 1 positions shown · non-contrast
Comparison: [DATE]

CLINICAL DATA: Feeding tube placement

EXAM:
PORTABLE ABDOMEN - 1 VIEW

[abdomen kub]
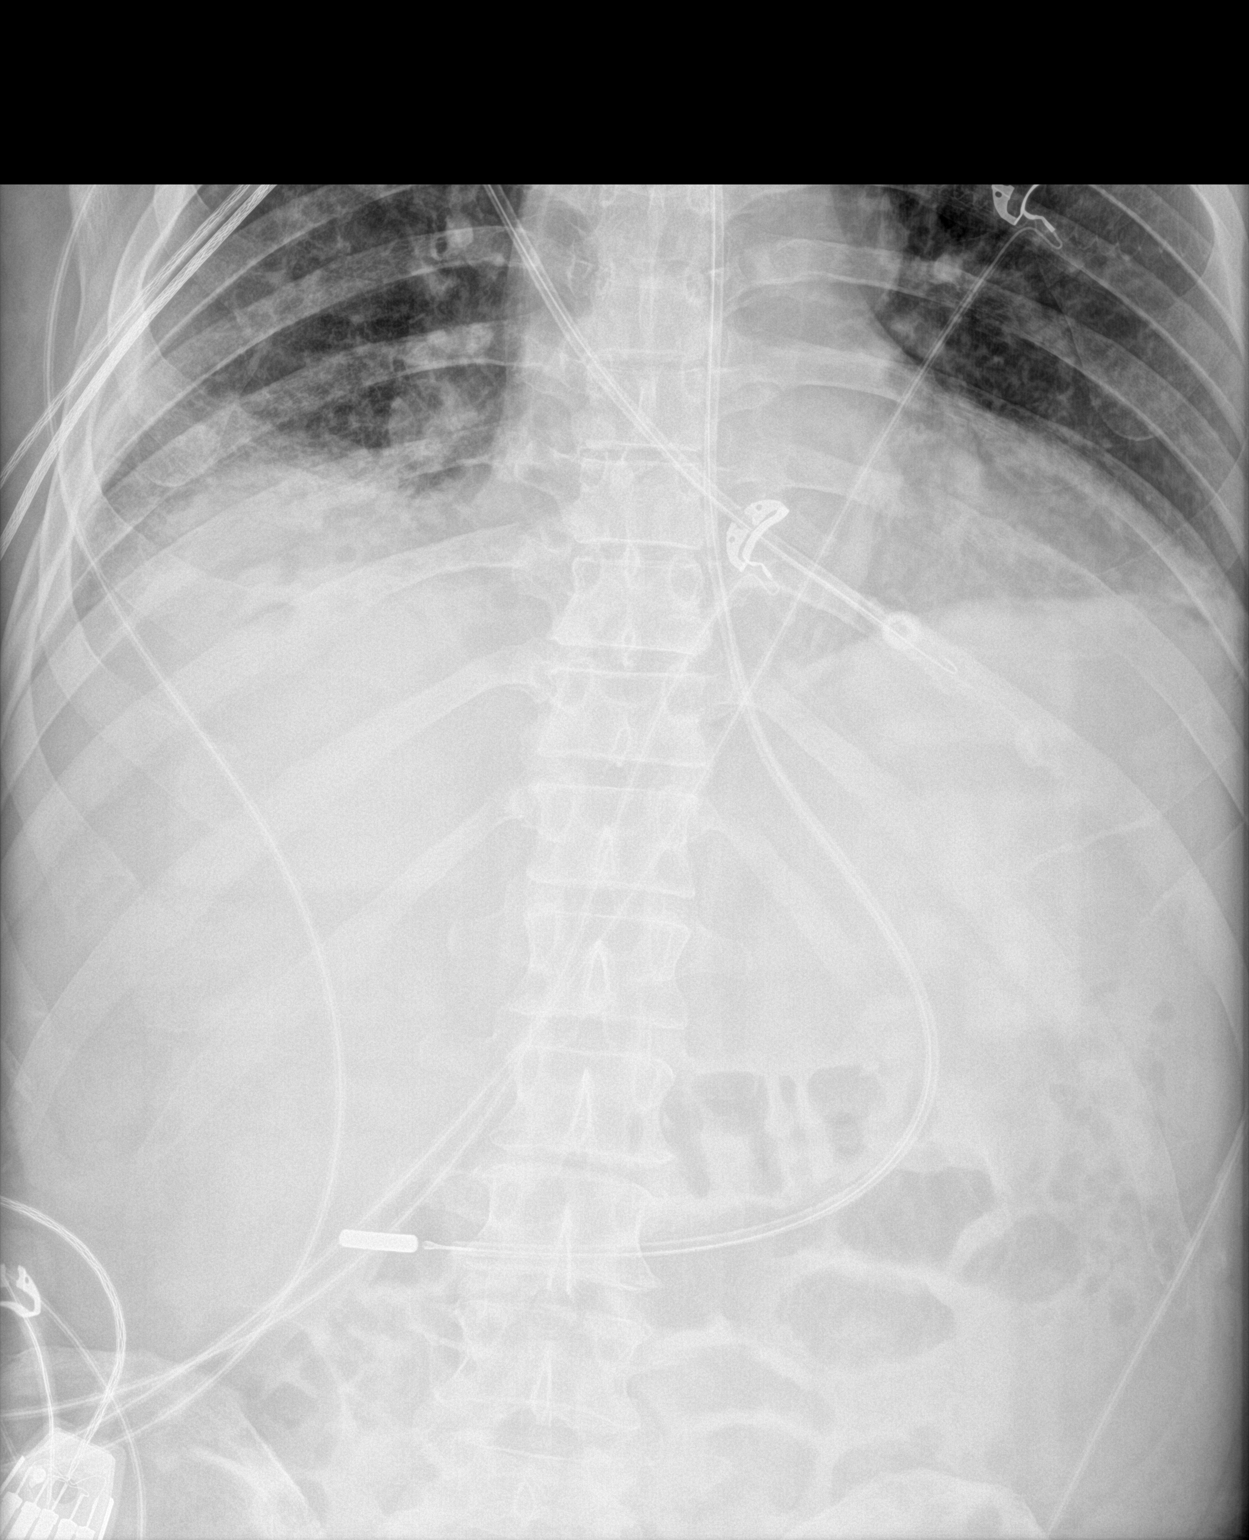

[1 of 1 positions shown; findings below may reference images not displayed]

FINDINGS: Feeding tube in place with the tip in the distal stomach.
IMPRESSION: Feeding tube tip in the distal stomach.

## 2020-09-22 MED ORDER — FUROSEMIDE 10 MG/ML PO SOLN
40.0000 mg | Freq: Every day | ORAL | Status: DC
  Administered 2020-09-22 – 2020-09-23 (×2): 40 mg
  Filled 2020-09-22 (×4): qty 4

## 2020-09-22 MED ORDER — POTASSIUM CHLORIDE 20 MEQ PO PACK: 40.0000 meq | PACK | Freq: Every day | ORAL | Status: DC

## 2020-09-22 MED ORDER — ENOXAPARIN SODIUM 80 MG/0.8ML ~~LOC~~ SOLN
80.0000 mg | Freq: Two times a day (BID) | SUBCUTANEOUS | Status: DC
  Administered 2020-09-22 (×2): 80 mg via SUBCUTANEOUS
  Filled 2020-09-22 (×3): qty 0.8

## 2020-09-22 MED ORDER — LISINOPRIL 2.5 MG PO TABS
5.0000 mg | ORAL_TABLET | Freq: Every day | ORAL | Status: DC
  Administered 2020-09-22 – 2020-09-25 (×4): 5 mg
  Filled 2020-09-22 (×4): qty 2

## 2020-09-22 NOTE — Evaluation (Signed)
Clinical/Bedside Swallow Evaluation Patient Details  Name: Joseph Ayala MRN: [id number] Date of Birth: [birth year]  Today's Date: 09/22/2020 Time: SLP Start Time (ACUTE ONLY): 1500 SLP Stop Time (ACUTE ONLY): 1535 SLP Time Calculation (min) (ACUTE ONLY): 35 min  Past Medical History:  Past Medical History:  Diagnosis Date  . Degenerative disc disease, lumbar   . Dental crown present   . ETOH abuse   . History of kidney stones   . Hypertension    states under control with meds., has been on med. x 1 yr. - is currently out of med., to see PCP 02/06/2018  . Muscle atrophy 01/2018  . Muscle weakness 01/2018  . Necrotizing myopathy   . Opioid use disorder   . Thoracic aortic aneurysm (TAA) (HCC)    a. seen on CT 08/2020.   Past Surgical History:  Past Surgical History:  Procedure Laterality Date  . ENDOVENOUS ABLATION SAPHENOUS VEIN W/ LASER Right 12-29-2015   endovenous laser ablation right greater saphenous vein, stab phlebectomy > 20 incisions right leg, sclerotherapy right leg by Gretta Began MD    . MINOR MUSCLE BIOPSY Right 02/11/2018   Procedure: RECTUS FEMORIS MUSCLE BIOPSY;  Surgeon: Darnell Level, MD;  Location: [facility];  Service: General;  Laterality: Right;  . MUSCLE BIOPSY Right 02/11/2018   Procedure: DELTOID MUSCLE BIOPSY;  Surgeon: Darnell Level, MD;  Location: [facility];  Service: General;  Laterality: Right;  . NASAL FRACTURE SURGERY    . TRACHEOSTOMY TUBE PLACEMENT N/A 09/14/2020   Procedure: TRACHEOSTOMY;  Surgeon: Suzanna Obey, MD;  Location: [facility];  Service: ENT;  Laterality: N/A;   HPI:  55 year old male on Suboxone for necrotizing myopathy admitted 2/19 with acute hypoxemic respiratory failure secondary to MRSA community-acquired pneumonia / aspiration. PCCM initially consulted for evaluation for bronchoscopy for possible mucous plugging. Transferred to ICU after worsening respiratory failure and mental status requiring intubation on 2/21.  ICU course complicated by hypotension requiring pressors, chest tube placement for empyema s/p intrapleural lytics. Trach 3/9. 3/17 attempting ATC 24/7. Holding ketamine at 1 trach collar trials during the day, vent at night.  Pt was intubated 17 days, trached on 3/9 and now on trach collar trials 24/7.  Per RN, he fatigues easily. He desires po intake.  Small right  pleural effusion with right base atelectasis. Lungs elsewhere clear. per most recent CXR. PMSV and swallow evaluation ordered.   Assessment / Plan / Recommendation Clinical Impression  Limited assessment completed due to pt's lengthy intubation  (x approximately 22 days), trachestomy, fatigue and severe deconditioning.   His CN exam was unremarkable x suspected minimal left facial asymmetry/decreased ROM with speaking tasks and pt mildly dysarthric.  In addition, pt with mild delay in responses noted intermittently during session.    SLP observed pt consuming ice chips *plain and flavored with soda*x 6 during session.  No indication of aspiration nor dysphagia and no changes in vitals apparent.  Pt fatigues very easily however and this may limit his ability to consume adequate nutrition while maintaining airway integrity.    Recommend to pursue MBS next date with PMSV on and off to determine readiness for po diet.  Given pt's fatigue factor, he may benefit from consuming small frequent amounts of po and continuing tube feeding for nutrition to allow improved conditioning for safety with po and adequacy of nutrition.    Suspect pt will tolerate po well however silent aspiration is a risk with this medically complex pt.  Informed pt, pt's wife, RN and NP with [facility].  Provide signs to post in room regarding pt's care plan.  SLP Visit Diagnosis: Dysphagia, unspecified (R13.10)      Aspiration Risk  Mild aspiration risk    Diet Recommendation          Other  Recommendations     Follow up Recommendations Other (comment) (TBD)       Frequency and Duration min 1 x/week          Prognosis        Swallow Study   General Date of Onset: 09/22/20 HPI: 55 year old male on Suboxone for necrotizing myopathy admitted 2/19 with acute hypoxemic respiratory failure secondary to MRSA community-acquired pneumonia / aspiration. PCCM initially consulted for evaluation for bronchoscopy for possible mucous plugging. Transferred to ICU after worsening respiratory failure and mental status requiring intubation on 2/21. ICU course complicated by hypotension requiring pressors, chest tube placement for empyema s/p intrapleural lytics. Trach 3/9. 3/17 attempting ATC 24/7. Holding ketamine at 1 trach collar trials during the day, vent at night.  Pt was intubated 17 days, trached on 3/9 and now on trach collar trials 24/7.  Per RN, he fatigues easily. He desires po intake.  Small right  pleural effusion with right base atelectasis. Lungs elsewhere clear. per most recent CXR. PMSV and swallow evaluation ordered. Type of Study: Bedside Swallow Evaluation Diet Prior to this Study: NPO Temperature Spikes Noted: No Respiratory Status: Trach Collar (28% 5 Liters) History of Recent Intubation: Yes Length of Intubations (days): 22 days Date extubated: 09/20/20 (night time vent) Behavior/Cognition: Alert;Cooperative;Pleasant mood Oral Cavity Assessment: Dry Oral Care Completed by SLP: Yes (brushed pt's teeth with regular toothbrush using suction) Oral Cavity - Dentition: Adequate natural dentition Vision: Functional for self-feeding Self-Feeding Abilities: Needs assist;Total assist Patient Positioning: Upright in bed Baseline Vocal Quality: Normal Volitional Cough: Strong Volitional Swallow: Able to elicit    Oral/Motor/Sensory Function Overall Oral Motor/Sensory Function: Mild impairment (appearanceof minimal left facial asymmetry with phonation  - wife reports pt broke his nose in the past and face is baseline) Facial ROM: Reduced left Facial  Symmetry: Abnormal symmetry left   Ice Chips Ice chips: Within functional limits Presentation: Spoon Other Comments: delayed cough x1- did not appear coorelated to po intake   Thin Liquid Thin Liquid: Not tested    Nectar Thick Nectar Thick Liquid: Not tested   Honey Thick Honey Thick Liquid: Not tested   Puree Puree: Not tested   Solid     Solid: Not tested      Chales Abrahams 09/22/2020,5:24 PM   Rolena Infante, MS [facility] SLP Acute Rehab Services Office [phone] Pager [phone]

## 2020-09-22 NOTE — Evaluation (Signed)
[facility]y-[facility] Speaking Valve - Evaluation Patient Details  Name: Joseph Ayala MRN: [id number] Date of Birth: 1966/06/13  Today's Date: 09/22/2020 Time: 1435-1500 SLP Time Calculation (min) (ACUTE ONLY): 25 min  Past Medical History:  Past Medical History:  Diagnosis Date  . Degenerative disc disease, lumbar   . Dental crown present   . ETOH abuse   . History of kidney stones   . Hypertension    states under control with meds., has been on med. x 1 yr. - is currently out of med., to see PCP 02/06/2018  . Muscle atrophy 01/2018  . Muscle weakness 01/2018  . Necrotizing myopathy   . Opioid use disorder   . Thoracic aortic aneurysm (TAA) (HCC)    a. seen on CT 08/2020.   Past Surgical History:  Past Surgical History:  Procedure Laterality Date  . ENDOVENOUS ABLATION SAPHENOUS VEIN W/ LASER Right 12-29-2015   endovenous laser ablation right greater saphenous vein, stab phlebectomy > 20 incisions right leg, sclerotherapy right leg by Gretta Began MD    . MINOR MUSCLE BIOPSY Right 02/11/2018   Procedure: RECTUS FEMORIS MUSCLE BIOPSY;  Surgeon: Darnell Level, MD;  Location: [facility];  Service: General;  Laterality: Right;  . MUSCLE BIOPSY Right 02/11/2018   Procedure: DELTOID MUSCLE BIOPSY;  Surgeon: Darnell Level, MD;  Location: [facility];  Service: General;  Laterality: Right;  . NASAL FRACTURE SURGERY    . TRACHEOSTOMY TUBE PLACEMENT N/A 09/14/2020   Procedure: TRACHEOSTOMY;  Surgeon: Suzanna Obey, MD;  Location: [facility];  Service: ENT;  Laterality: N/A;   HPI:  55 year old male on Suboxone for necrotizing myopathy admitted 2/19 with acute hypoxemic respiratory failure secondary to MRSA community-acquired pneumonia / aspiration. PCCM initially consulted for evaluation for bronchoscopy for possible mucous plugging. Transferred to ICU after worsening respiratory failure and mental status requiring intubation on 2/21. ICU course complicated by hypotension requiring  pressors, chest tube placement for empyema s/p intrapleural lytics. Trach 3/9. 3/17 attempting ATC 24/7. Holding ketamine at 1 trach collar trials during the day, vent at night.  Pt was intubated 17 days, trached on 3/9 and now on trach collar trials 24/7.  Per RN, he fatigues easily. He desires po intake.  Small right  pleural effusion with right base atelectasis. Lungs elsewhere clear. per most recent CXR. PMSV and swallow evaluation ordered.   Assessment / Plan / Recommendation Clinical Impression  Patient with excellent tolerance of the PMSV - demonstrating excellent phonatory ability with all vitals remaining stable throughout entire session.  Pt used PMSV for 55 minutes from 1440-1535 without CO2 retention nor significant fatigue.  Mild dysarthria and delay in responses noted at times but with effort pt maximizes his intelligibility and expressive communication.   Pt subjectively assigns his vocal quality a 6/10 - with 10 being normal (and wife concurs).  SLP educated wife *an Charity fundraiser from [employer]* and pt to use of PMSV and contraindications.  Wife donned and removed PMSV with SLP supervision *and she works with many trach patients at [employer].  Recommend pt use valve freely with full supervision with staff.  Anticipate he will tolerate this valve throughout the day without difficulties and hopefully this may help transition him into decannulation. SLP Visit Diagnosis: Aphonia (R49.1)    SLP Assessment  Patient needs continued Speech Lanaguage Pathology Services    Follow Up Recommendations  Other (comment) (TBD)    Frequency and Duration min 1 x/week  2 weeks    PMSV Trial  PMSV was placed for: verbal communication Able to redirect subglottic air through upper airway: Yes Able to Attain Phonation: Yes Voice Quality: Other (comment) (pt and wife report pitch to be higher than normal but voice is strong, voice assigned 6/10 by pt/spouse with 10 being normal) Able to Expectorate Secretions: No  attempts Level of Secretion Expectoration with PMSV: Not observed Breath Support for Phonation: Adequate Intelligibility: Intelligible Respirations During Trial: 18 (16-19, average of 17.71) SpO2 During Trial: 97 % (96-100) Pulse During Trial: 89 (86-91) Behavior: Alert;Controlled;Expresses self well;Responsive to questions   Tracheostomy Tube  Additional Tracheostomy Tube Assessment Fenestrated: No Trach Collar Period: attempting 24 hour today 3/17 Frequency of Tracheal Suctioning: rn report pt not requiring suctioning, able to clear via trach of mild amount of white moderately-thick secretions Level of Secretion Expectoration: Tracheal    Vent Dependency  FiO2 (%): 28 % Nocturnal Vent: Yes    Cuff Deflation Trial  GO Tolerated Cuff Deflation: Yes Length of Time for Cuff Deflation Trial: during entire session pt with cuff deflated Behavior: Alert;Other (comment);Cooperative;Expresses self well (delayed responses at times, pt fatigues easily but making great progress given his current medical status)        Chales Abrahams 09/22/2020, 5:06 PM Rolena Infante, MS [facility] SLP Acute Rehab Services Office [phone] Pager [phone]

## 2020-09-22 NOTE — Progress Notes (Signed)
Per verbal order from CCM, removed stitches from trach.

## 2020-09-22 NOTE — Progress Notes (Addendum)
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 PCCM initially consulted for evaluation for bronchoscopy for possible mucous plugging. Transferred to ICU after worsening respiratory failure and mental status requiring intubation on 2/21. ICU course complicated by hypotension requiring pressors, chest tube placement for empyema s/p intrapleural lytics. Trach 3/9.   Hx of poorly explained myopathy, self medicating, pulse steroids / methotrexate.    Past Medical History:  Hypertension, necrotizing myopathy, opioid use on Suboxone  Significant Hospital Events:  2/19 Arrived to ED for cough and chest pain x 1 week 2/20 Admitted to [facility] in early am. Intubated. Bronch 2/22 Worsening respiratory status and vasopressor requirement. Agitation on vent 2/24 Off pressors. Right chest tube placement. Propofol changed versed as TRG increased 2/25 About 650 out of CT since insertion. Stable vent requirements. Sedation and agitation a challenge / librium added. Fever. TPA into chest tube 2/26 FOB for mucus plugging 2/28 Repeat CT with little residual pleural fluid, largely consolidated lung. On vent. Oral oxy added.  3/01 Ceiling of IV gtt's reduced, weaning on PSV 10/5 - weaned for 8h. Diuresis.   3/02 Sedation changed, hypotension, levophed initiated. FOB with BAL, chest tube removed.   3/04 Discontinue aline, PICC placed LUE 3/06 Improved fever curve on linezolid, challenging sedation 3/07 Difficulties with sedation > changed back to versed, dilaudid. Oral methadone added.  3/08 R white out on CXR / mucus plugging. Methadone stopped, re-challenge with ketamine.  FOB with BAL.  3/09 Calmer on ketamine at higher dose, on propofol 20 mcg, dilaudid 40m Ketamine increased. Propofol stopped during day. 3/11 Remains on ketamine, dilaudid down to 168m/ weaning  3/12 added clonidine to help with dilaudid wean completely off  drip stopped and still needed prn for HBP 3/12 added reglan 5 mg IV q6h  for emesis  Started reglan p emesis 3/13 > resolved and ready to restart tf No supplemental dilaudid overnight  3/14 added librium taper. Dec ketamine to 1.2  3/15 ketamine dec to 1. On ATC 3/17 attempting ATC 24/7. Holding ketamine at 1. SLP asked to eval.   Consults:  TRH PCCM ID  Procedures:  ETT 2/21 >> 3/9 R Radial Aline 2/20 >> 3/4  L IJ TLC 2/21 >> 3/4 R CT 2/24 >> 3/2 LUE PICC 3/4 >>  TrLurline IdolBJanace Hoard3/9 >>   Significant Diagnostic Tests:   See note from 3/16 for additional diagnostic data   CT Chest 3/8 >> persistent area of RML, RLL airspace disease with decreasing cavitation and increasing consolidation since prior study, increased airspace disease throughout the aerated RUL, consistent with progressive PNA. Stable LLL consolidation, trace L pleural effusion   TEE 3/11 >> EF 45-50%  No veg/ L to R ASD   Micro Data:  See note from 3/16 for prior micro data BAL 3/8 >> rare staph aureus >>  Rare MRSA   Antimicrobials:  See note from 3/16 for prior abx data Ceftaroline 3/8 >>    Interim History / Subjective:  "thirsty" wants something to drink  Objective   Blood pressure (Abnormal) 123/92, pulse 83, temperature 98.78 F (37.1 C), resp. rate 16, height _0  (1.803 m), weight 82.9 kg, SpO2 98 %.  Vent Mode: PRVC FiO2 (%):  [28 %-35 %] 35 % Set Rate:  [16 bmp] 16 bmp Vt Set:  [600 mL] 600 mL PEEP:  [5 cmH20] 5 cmH20 Plateau Pressure:  [10 cmH20-17 cmH20] 17 cmH20   Intake/Output Summary (Last 24 hours) at 09/22/2020 0934 Last data filed at 09/22/2020 8372 Gross per 24 hour  Intake 3808.02 ml  Output 4800 ml  Net -991.98 ml    Filed Weights   09/19/20 0439 09/20/20 0500 09/22/20 0500  Weight: 90.1 kg 86.6 kg 82.9 kg   Physical Exam:  General this is a 55 year old WM currently on ATC he looks very confortable HENT trach unremarkable has old bloody drainage. Cuff inflated pulm CTA. Dec bases Card rrr abd soft  Ext warm and dry  Neuro approp       Resolved Hospital Problem list    Assessment & Plan:Elevated LFTs - likely combination of hx and congestive hepatopathy AKI Aspiration pneumonia/pneumonitis in setting of altered mental status Septic shock (off pressors as of 2/24) Cardiogenic shock  Demand ischemia Parapneumonic Effusion versus Empyema:S/p chest tube placement 2/24, tPA 2/25, 2/26.  Chest tube removed 3/2.  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 Plan Day 10 ceftaroline/abx day 27 Will change vent to PRN PAD goal 0 VAP bundle SLP eval  Lasix (push to daily VT)    New onset HFrEF -follow-up TEE on 3/11 showed improvement of LVEF 45 to 50% echocardiogram was negative for vegetation/infectious endocarditis Thoracic Aneurysm - will need outpatient follow up  Plan Will eventually need cards eval again as closer to dc   Atrial fibrillation Plan Cont LMWH Cont lopressor  Cont tele    Acute Metabolic Encephalopathy / Agitated Delirium  Significant Alcohol use and Narcotic Dependence Concern for withdrawal and sepsis -significant improvement on ketamine Plan Cont Suboxone Cont Clonidine at 0.1m VT tid Cont Seroquel at 2077mbid Librium taper w/ scheduled clonazepam  Changing Ketamine taper to dec by 0.2 mg every 48hrs (dec to 0.8 on 3/18)  Fluid and electrolyte imbalance: Intermittent Plan Trend and replace as needed  Moderate Protein Calorie Malnutrition  Plan tubefeeds  Macrocytic Anemia  Plan Cont vit D Trend  cbc Transfuse as needed.   Best practice (evaluated daily)  Diet: TF Pain/Anxiety/Delirium protocol (if indicated): PAD goal 0 VAP protocol (if indicated): Yes DVT prophylaxis: Lovenox, treatment dose GI prophylaxis: Protonix Glucose control: CBG q4h Mobility: As tolerated Disposition: ICU  Goals of Care:  Last date of multidisciplinary goals of care discussion:  Family and staff present:  Summary of discussion:  Follow up goals of care discussion due: 3/23 Code Status: Full code  Family:Wife  updated     My cct 32 min  PeErick ColaceCNP-BC LeCynth[place]ager # [id number] # [phone] no answer

## 2020-09-22 NOTE — Patient Outreach (Addendum)
[facility]) Care Management  09/22/2020  Joseph Ayala 11/17/1965 [id number]   Transition of Care Referral. Referral received: 08/29/20   Referral received for transition of care, patient remains admitted at [facility] since 08/27/20.     Plan Will close to [facility] care management and await  discharge disposition follow up referral  from [facility] liaison .  [name], RN, BSN  [facility] Care Management,Care Management Coordinator  [phone]- Mobile [phone]- Toll Free Main Office

## 2020-09-23 ENCOUNTER — Inpatient Hospital Stay (HOSPITAL_COMMUNITY): Payer: Medicare HMO

## 2020-09-23 DIAGNOSIS — J850 Gangrene and necrosis of lung: Secondary | ICD-10-CM | POA: Diagnosis not present

## 2020-09-23 DIAGNOSIS — G9341 Metabolic encephalopathy: Secondary | ICD-10-CM | POA: Diagnosis not present

## 2020-09-23 LAB — BASIC METABOLIC PANEL
Anion gap: 10 (ref 5–15)
BUN: 26 mg/dL — ABNORMAL HIGH (ref 6–20)
CO2: 24 mmol/L (ref 22–32)
Calcium: 9.1 mg/dL (ref 8.9–10.3)
Chloride: 105 mmol/L (ref 98–111)
Creatinine, Ser: 0.48 mg/dL — ABNORMAL LOW (ref 0.61–1.24)
GFR, Estimated: >60 mL/min (ref >60)
Glucose, Bld: 99 mg/dL (ref 70–99)
Potassium: 3.7 mmol/L (ref 3.5–5.1)
Sodium: 139 mmol/L (ref 135–145)

## 2020-09-23 LAB — GLUCOSE, CAPILLARY
Glucose-Capillary: 85 mg/dL (ref 70–99)
Glucose-Capillary: 111 mg/dL — ABNORMAL HIGH (ref 70–99)
Glucose-Capillary: 129 mg/dL — ABNORMAL HIGH (ref 70–99)
Glucose-Capillary: 119 mg/dL — ABNORMAL HIGH (ref 70–99)
Glucose-Capillary: 119 mg/dL — ABNORMAL HIGH (ref 70–99)
Glucose-Capillary: 91 mg/dL (ref 70–99)

## 2020-09-23 IMAGING — DX DG CHEST 1V PORT
1 series · 1 of 1 positions shown · non-contrast
Comparison: [DATE] and CT chest [DATE].

CLINICAL DATA: Pneumonia.

EXAM:
PORTABLE CHEST 1 VIEW

[chest ap]
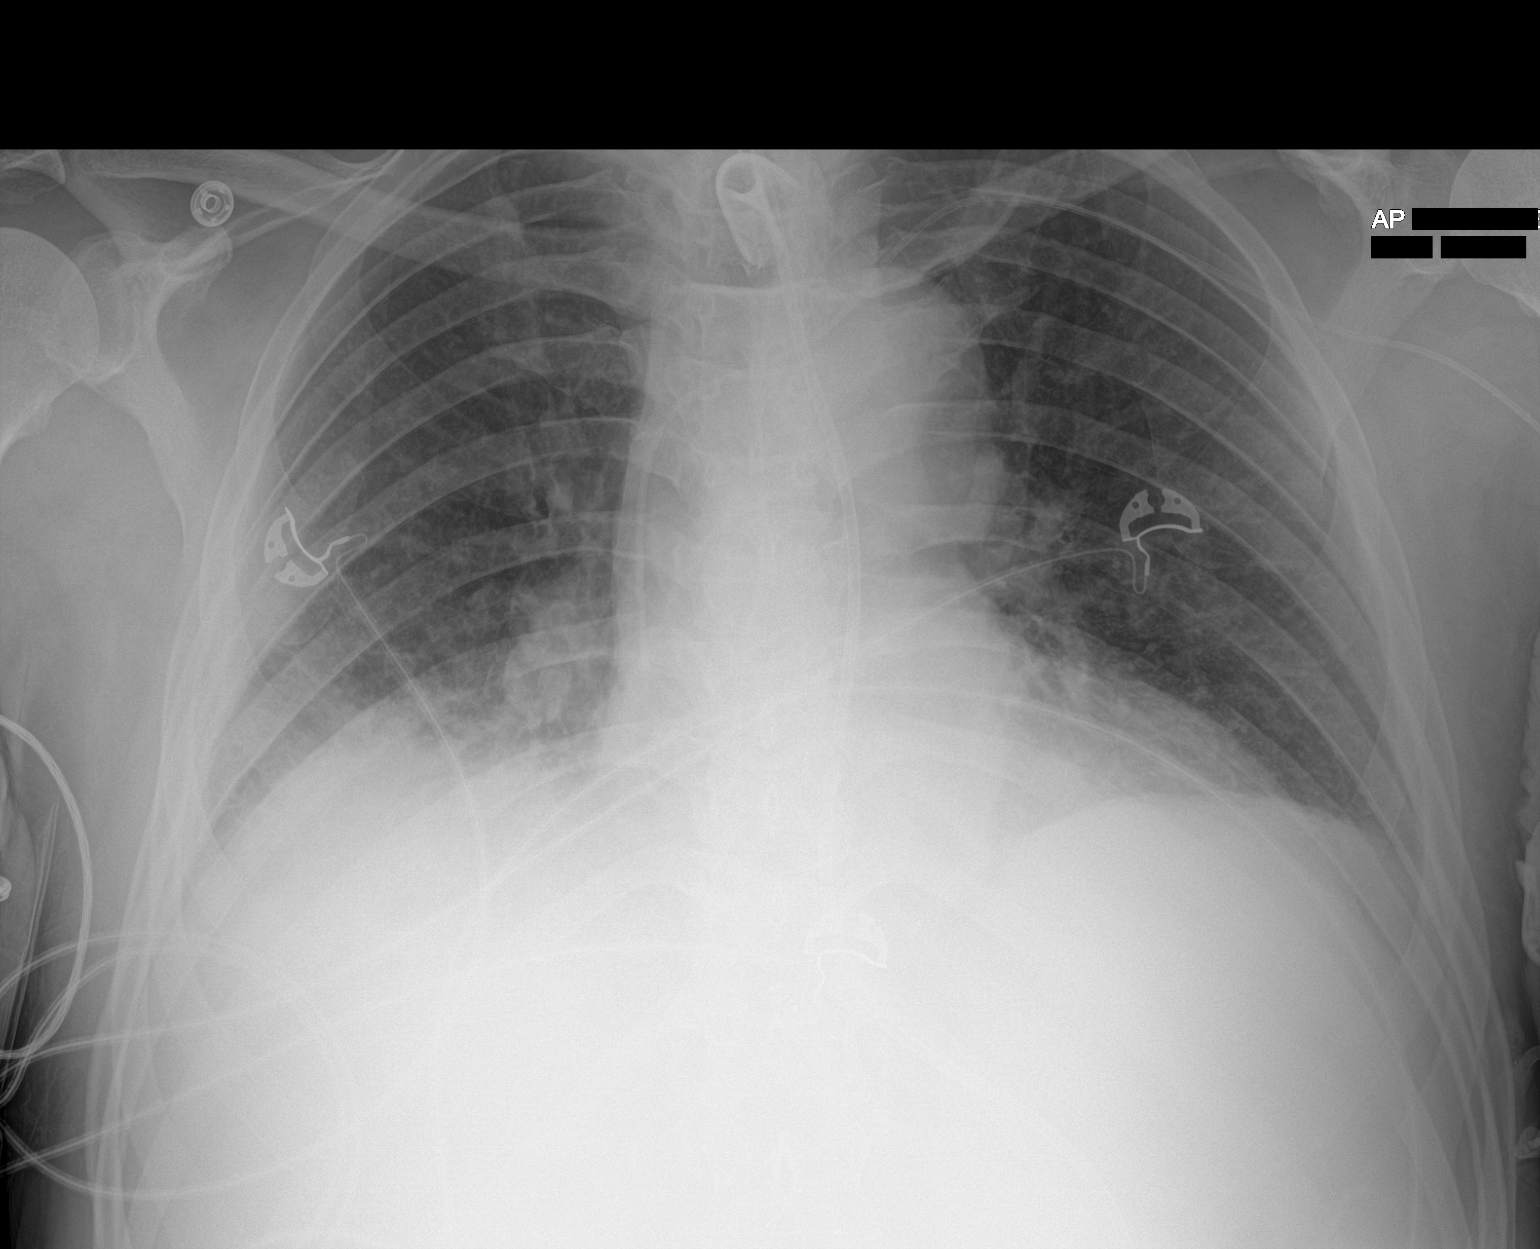

[1 of 1 positions shown; findings below may reference images not displayed]

FINDINGS: Tracheostomy is midline. Left PICC tip is in the low SVC. Feeding
tube is followed into the stomach with the tip projecting beyond the
inferior margin of the image.

Heart is enlarged, stable. Lungs are low in volume with residual but
improving right basilar airspace opacification. No definite pleural
fluid.
IMPRESSION: Improving right basilar pneumonia.

## 2020-09-23 MED ORDER — APIXABAN 5 MG PO TABS
5.0000 mg | ORAL_TABLET | Freq: Two times a day (BID) | ORAL | Status: DC
  Administered 2020-09-23: 5 mg via ORAL
  Filled 2020-09-23: qty 1

## 2020-09-23 MED ORDER — APIXABAN 5 MG PO TABS
5.0000 mg | ORAL_TABLET | Freq: Two times a day (BID) | ORAL | Status: DC
  Administered 2020-09-23 – 2020-09-25 (×5): 5 mg
  Filled 2020-09-23 (×5): qty 1

## 2020-09-23 NOTE — Progress Notes (Signed)
Occupational Therapy Treatment Patient Details Name: Joseph Ayala MRN: [id number] DOB: July 26, 1965 Today's Date: 09/23/2020    History of present illness Patient is a 55 y.o. male s/p tracheotomy on 09/14/2020 who presented to the ED with complaints of chest pain, cough, and SOB admitted for acute hypoxemic respiratory failure secondary to MRSA community-acquired pneumonia / aspiration with PMH significant for aortic aneurysm, HTN,  ETOH abuse, and opioid use.   OT comments  Patient attempting to get out of bed when therapist near patient's room and therapist entered room to assist RN. Patient able to partially come into sitting and have one leg half off the bed near the foot of the bed. Patient fatigued quickly, while therapist and nursing prepared room, lines and leads for transfer and laid himself back down. Required mod x 2 to sit back up, mostly for trunk lift off and negotiation. Patient mod x 2 to stand with walker and take steps to recliner. Patient exhibited scissoring gait but able to maintain weight through LEs to complete transfer though balance poor. Patient significantly fatigued after transfer and unable to participate in UE activity. Patient continues to have slow processing and impulsive behavior. Patient unable to state where he is and perseverates on being thirsty. Patient's mobility improving. Cont POC.   Follow Up Recommendations  CIR    Equipment Recommendations  None recommended by OT    Recommendations for Other Services      Precautions / Restrictions Precautions Precautions: Fall Precaution Comments: NG tube, trach, rectal tube, impulsive/cognitive impaired, ketamine drip Restrictions Weight Bearing Restrictions: No       Mobility Bed Mobility Overal bed mobility: Needs Assistance Bed Mobility: Supine to Sit     Supine to sit: +2 for physical assistance;+2 for safety/equipment;HOB elevated;Mod assist     General bed mobility comments: Initially patient  trying to get out of bed and sitting at end of bed with one leg handing off left side of bed. After sitting at EOB while therapist and nurse getting ready for transfer patient laid himself back down. Requried mod x 2 to transfer back into sitting - patient appeared fatigued.    Transfers Overall transfer level: Needs assistance Equipment used: Rolling walker (2 wheeled) Transfers: Sit to/from [employer] Sit to Stand: Mod assist;+2 physical assistance;+2 safety/equipment Stand pivot transfers: Mod assist;+2 physical assistance;+2 safety/equipment       General transfer comment: Improved ability to assist with power up. [place] able to maintain hand holds on walker but not able to use arms effectively on walker. scissoring gait when taking steps to recilner mod x 2 with walker to achieve transfer.    Balance Overall balance assessment: Needs assistance Sitting-balance support: Single extremity supported Sitting balance-Leahy Scale: Fair Sitting balance - Comments: Able to maintain upright posture on bed when attempting to get up unsafely and to maintain sitting edge of bed though min guard provided. Poor sitting tolerance.   Standing balance support: During functional activity;Bilateral upper extremity supported Standing balance-Leahy Scale: Poor                             ADL either performed or assessed with clinical judgement   ADL  Vision   Vision Assessment?: No apparent visual deficits   Perception     Praxis      Cognition Arousal/Alertness: Awake/alert Behavior During Therapy: Restless;Impulsive Overall Cognitive Status: Difficult to assess                                 General Comments: Decreased processing time, impulsive with movement, unable to state where he is at.        Exercises     Shoulder Instructions       General Comments       Pertinent Vitals/ Pain       Pain Assessment: Faces Faces Pain Scale: No hurt  Home Living                                          Prior Functioning/Environment              Frequency  Min 2X/week        Progress Toward Goals  OT Goals(current goals can now be found in the care plan section)  Progress towards OT goals: Progressing toward goals  Acute Rehab OT Goals Patient Stated Goal: get stronger OT Goal Formulation: With family Potential to Achieve Goals: Good  Plan Discharge plan remains appropriate    Co-evaluation          OT goals addressed during session:  (functional mobility, activity tolerance)      AM-PAC OT "6 Clicks" Daily Activity     Outcome Measure   Help from another person eating meals?: Total Help from another person taking care of personal grooming?: A Little Help from another person toileting, which includes using toliet, bedpan, or urinal?: Total Help from another person bathing (including washing, rinsing, drying)?: A Lot Help from another person to put on and taking off regular upper body clothing?: A Little Help from another person to put on and taking off regular lower body clothing?: Total 6 Click Score: 11    End of Session Equipment Utilized During Treatment: Oxygen;Rolling walker  OT Visit Diagnosis: Unsteadiness on feet (R26.81);Other abnormalities of gait and mobility (R26.89);Muscle weakness (generalized) (M62.81)   Activity Tolerance Patient limited by fatigue   Patient Left in chair;with call bell/phone within reach;with family/visitor present;with chair alarm set   Nurse Communication Mobility status        Time: 5852-7782 OT Time Calculation (min): 17 min  Charges: OT General Charges $OT Visit: 1 Visit OT Treatments $Therapeutic Activity: 8-22 mins  Waldron Session, OTR/L Acute Care Rehab Services  Office [phone] Pager: [phone]    Kelli Churn 09/23/2020, 12:32 PM

## 2020-09-23 NOTE — Progress Notes (Signed)
[place] for apixaban Indication: atrial fibrillation  Allergies  Allergen Reactions  . Vicodin [Hydrocodone-Acetaminophen] Nausea And Vomiting    Patient Measurements: Height: '5\' 11"'  (180.3 cm) Weight: 80.7 kg (177 lb 14.6 oz) IBW/kg (Calculated) : 75.3 Heparin Dosing Weight:   Vital Signs: Temp: 99.5 F (37.5 C) (03/18 0800) Temp Source: Bladder (03/18 0800) BP: 144/96 (03/18 1004) Pulse Rate: 89 (03/18 1001)  Labs: Recent Labs    09/21/20 0649 09/22/20 0620 09/23/20 0500  HGB 9.8*  --   --   HCT 31.5*  --   --   PLT 444*  --   --   CREATININE 0.59* 0.44* 0.48*    Estimated Creatinine Clearance: 113.7 mL/min (A) (by C-G formula based on SCr of 0.48 mg/dL (L)).   Medical History: Past Medical History:  Diagnosis Date  . Degenerative disc disease, lumbar   . Dental crown present   . ETOH abuse   . History of kidney stones   . Hypertension    states under control with meds., has been on med. x 1 yr. - is currently out of med., to see PCP 02/06/2018  . Muscle atrophy 01/2018  . Muscle weakness 01/2018  . Necrotizing myopathy   . Opioid use disorder   . Thoracic aortic aneurysm (TAA) ([place])    a. seen on CT 08/2020.    Medications:  Scheduled:  . apixaban  5 mg Oral BID  . bethanechol  10 mg Per Tube TID  . chlorhexidine gluconate (MEDLINE KIT)  15 mL Mouth Rinse BID  . Chlorhexidine Gluconate Cloth  6 each Topical Daily  . clonazePAM  2 mg Per Tube Q8H  . cloNIDine  0.2 mg Per Tube TID  . dextrose  25 g Intravenous Once  . docusate  100 mg Per Tube BID  . feeding supplement (PIVOT 1.5 CAL)  1,000 mL Per Tube Q24H  . feeding supplement (PROSource TF)  45 mL Per Tube Daily  . folic acid  1 mg Per Tube Daily  . furosemide  40 mg Per Tube Daily  . guaiFENesin  10 mL Per Tube BID  . hydrocerin   Topical Daily  . lisinopril  5 mg Per Tube Daily  . mouth rinse  15 mL Mouth Rinse 10 times per day  . metoprolol  tartrate  50 mg Per Tube BID  . multivitamin  15 mL Per Tube Daily  . nystatin   Topical BID  . pantoprazole sodium  40 mg Per Tube Daily  . potassium chloride  40 mEq Per Tube Daily  . QUEtiapine  200 mg Per Tube BID  . sodium chloride flush  10-40 mL Intracatheter Q12H  . thiamine  100 mg Per Tube Daily    Assessment: Pharmacy is consulted to dose apixaban in 55 yo male diagnosed with afib. Pt has been on Enoxaparin 1 mg/kg BID dosing but is now transitioning to PO apixaban treatment as mentation improves.   Today, 09/23/20   SCr 0.48 mg/ml  Hgb low but stable, ply WNL  No noted bleeding issues   Goal of Therapy:  Monitor platelets by anticoagulation protocol: Yes   Plan:   Apixaban 5 mg PO BID   Monitor SCr, CBC as avilable  Monitor for signs and symptoms of bleeding   Dosage will likely remain stable at above dosage and need for further dosage adjustment appears unlikely at present.    Will sign off at this time.  Please reconsult if a change in clinical status warrants re-evaluation of dosage.  Royetta Asal, PharmD, BCPS 09/23/2020 10:10 AM

## 2020-09-23 NOTE — Progress Notes (Addendum)
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 PCCM initially consulted for evaluation for bronchoscopy for possible mucous plugging. Transferred to ICU after worsening respiratory failure and mental status requiring intubation on 2/21. ICU course complicated by hypotension requiring pressors, chest tube placement for empyema s/p intrapleural lytics. Trach 3/9.   Hx of poorly explained myopathy, self medicating, pulse steroids / methotrexate.    Past Medical History:  Hypertension, necrotizing myopathy, opioid use on Suboxone  Significant Hospital Events:  2/19 Arrived to ED for cough and chest pain x 1 week 2/20 Admitted to [facility] in early am. Intubated. Bronch 2/22 Worsening respiratory status and vasopressor requirement. Agitation on vent 2/24 Off pressors. Right chest tube placement. Propofol changed versed as TRG increased 2/25 About 650 out of CT since insertion. Stable vent requirements. Sedation and agitation a challenge / librium added. Fever. TPA into chest tube 2/26 FOB for mucus plugging 2/28 Repeat CT with little residual pleural fluid, largely consolidated lung. On vent. Oral oxy added.  3/01 Ceiling of IV gtt's reduced, weaning on PSV 10/5 - weaned for 8h. Diuresis.   3/02 Sedation changed, hypotension, levophed initiated. FOB with BAL, chest tube removed.   3/04 Discontinue aline, PICC placed LUE 3/06 Improved fever curve on linezolid, challenging sedation 3/07 Difficulties with sedation > changed back to versed, dilaudid. Oral methadone added.  3/08 R white out on CXR / mucus plugging. Methadone stopped, re-challenge with ketamine.  FOB with BAL.  3/09 Calmer on ketamine at higher dose, on propofol 20 mcg, dilaudid 71m Ketamine increased. Propofol stopped during day. 3/11 Remains on ketamine, dilaudid down to 139m/ weaning  3/12 added clonidine to help with dilaudid wean completely off  drip stopped and still needed prn for HBP 3/12 added reglan 5 mg IV q6h  for emesis  Started reglan p emesis 3/13 > resolved and ready to restart tf No supplemental dilaudid overnight  3/14-3/18.  added librium taper (completed 3/18). ketamine tapered slowly titrated down from 1.2 down to 0.8 ATC trials started on 3/15 and by 3/18 completely liberated from vent. SLP and PMV trials started 3/17 and MBS on 3/18. LMWH changed to DOTar Heeln 3/18 for afib. Dc foley   Consults:  TRH PCCM ID  Procedures:  ETT 2/21 >> 3/9 R Radial Aline 2/20 >> 3/4  L IJ TLC 2/21 >> 3/4 R CT 2/24 >> 3/2 LUE PICC 3/4 >>  TrLurline IdolBJanace Hoard3/9 >>   Significant Diagnostic Tests:   See note from 3/16 for additional diagnostic data   CT Chest 3/8 >> persistent area of RML, RLL airspace disease with decreasing cavitation and increasing consolidation since prior study, increased airspace disease throughout the aerated RUL, consistent with progressive PNA. Stable LLL consolidation, trace L pleural effusion   TEE 3/11 >> EF 45-50%  No veg/ L to R ASD   Micro Data:  See note from 3/16 for prior micro data BAL 3/8 >> rare staph aureus >>  Rare MRSA   Antimicrobials:  See note from 3/16 for prior abx data Ceftaroline 3/8 >>    Interim History / Subjective:  Wants to drink water.   Objective   Blood pressure (Abnormal) 128/101, pulse 88, temperature  99.5 F (37.5 C), temperature source Bladder, resp. rate 16, height _0  (1.803 m), weight 80.7 kg, SpO2 97 %.    FiO2 (%):  [28 %] 28 %   Intake/Output Summary (Last 24 hours) at 09/23/2020 0931 Last data filed at 09/23/2020 0400 Gross per 24 hour  Intake 1152.98 ml  Output 3400 ml  Net -2247.02 ml   Filed  Weights   09/20/20 0500 09/22/20 0500 09/23/20 0500  Weight: 86.6 kg 82.9 kg 80.7 kg   Physical Exam: General resting in bed. No distress HENT NCAT trach site unremarkable  Pulm Clear on ATC Card rrr abd soft tol TFs. Still has liq stools Ext warm and dry  Neuro awake and appropriate. Getting stronger. Moves all ext. Pretty good phonation w/ PMV       Resolved Hospital Problem list    Assessment & Plan:Elevated LFTs - likely combination of hx and congestive hepatopathy AKI Aspiration pneumonia/pneumonitis in setting of altered mental status Septic shock (off pressors as of 2/24) Cardiogenic shock  Demand ischemia Parapneumonic Effusion versus Empyema:S/p chest tube placement 2/24, tPA 2/25, 2/26.  Chest tube removed 3/2.  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 Improved overall though especially on right side Plan Day 11 Ceftaroline and day 28 abx (directed by ID) Dc vent from room VAP bundle SLP working w/ him; would encourage PMV as much as possible when staff or wife in room OOB Cont Lasix VT   New onset HFrEF -follow-up TEE on 3/11 showed improvement of LVEF 45 to 50% echocardiogram was negative for vegetation/infectious endocarditis Thoracic Aneurysm - will need outpatient follow up  Plan Will need to re-consult cards as he gets closer to dc Cont ace-I and bb  Atrial fibrillation Plan Cont LMWH Will change to DOAC Lopressor Tele    Acute Metabolic Encephalopathy / Agitated Delirium  Significant Alcohol use and Narcotic Dependence Concern for withdrawal and sepsis -significant improvement on ketamine, completed Librium taper Plan Cont to hold suboxone  Cont clonidine 0.2 VT tid Cont seroquel at 278m bid Librium  Cont Clonidine at 0.232mVT tid Cont Seroquel at 20031mbid Cont current clonazepam  Dec Ketamine to 0.8 today w/ plan to reduce another 0.2mg35m 48 hrs (3/20) Staff bringing him outside today w/ WC  Fluid and electrolyte imbalance: Intermittent Plan Replace as needed  Moderate Protein Calorie Malnutrition Diarrhea  Plan Cont tubefeeds. If he passes swallow eval will start orals. This might help w/ the diarrhea   Macrocytic Anemia ->improved Plan Trend cbc Transfuse as indicated  Best practice (evaluated daily)   Best Practice   Diet:  Tube Feed  Pain/Anxiety/Delirium protocol (if indicated): Yes (RASS goal 0) VAP protocol (if indicated): Yes DVT prophylaxis: Systemic AC GI prophylaxis: PPI Glucose control:  SSI No Central venous access:  Yes, and it is still needed Arterial line:  N/A Foley:  Yes, and it is no longer needed Mobility:  OOB  PT consulted: Yes Last date of multidisciplinary goals of care discussion [daily] Code Status:  full code Disposition: ICU. Can prob be SD status soon    Goals of Care:  Last date of multidisciplinary goals of care discussion:  Follow up goals of care discussion due: 3/23 Code Status: Full code  Family:Wife  updated     My cct 32 min   PeteErick Colace  ACNP-BC [place] Pager # [phone] OR # [phone] if no answer

## 2020-09-23 NOTE — Progress Notes (Signed)
Speech Language Pathology Treatment: Dysphagia  Patient Details Name: Joseph Ayala Student MRN: [id number] DOB: 1965-09-13 Today's Date: 09/23/2020 Time: 1324-4010 SLP Time Calculation (min) (ACUTE ONLY): 10 min  Assessment / Plan / Recommendation Clinical Impression  SLP in for follow up to provide education after MBS completed today. Pt and wife were receptive to review of MBS results, recommendations, and safe swallow precautions. Written strategies for safe swallow, PMSV use, and caution re: deflating cuff prior to application of PMSV were attached to pt's bed, as wife indicated he would be moving out of ICU and to another floor. Pt had PMSV in place, and was able to answer questions with low vocal intensity. SLP will continue to follow to assess readiness to advance to solid textures.    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 PCCM initially consulted for evaluation for bronchoscopy for possible mucous plugging. Transferred to ICU after worsening respiratory failure and mental status requiring intubation on 2/21. ICU course complicated by hypotension requiring pressors, chest tube placement for empyema s/p intrapleural lytics. Trach 3/9. 3/17 attempting ATC 24/7. Holding ketamine at 1 trach collar trials during the day, vent at night.  Pt was intubated 17 days, trached on 3/9 and now on trach collar trials 24/7.  Per RN, he fatigues easily. He desires po intake.  Small right  pleural effusion with right base atelectasis. Lungs elsewhere clear. per most recent CXR. PMSV and swallow evaluation ordered.      SLP Plan  Continue with current plan of care       Recommendations  Diet recommendations: Other(comment) (full liquid, advance as tolerated) Liquids provided via: Cup;Straw Medication Administration: Whole meds with liquid Supervision: Full  supervision/cueing for compensatory strategies;Staff to assist with self feeding Compensations: Minimize environmental distractions;Slow rate;Small sips/bites Postural Changes and/or Swallow Maneuvers: Seated upright 90 degrees;Upright 30-60 min after meal      Patient may use Passy-Muir Speech Valve: Intermittently with supervision;During all therapies with supervision;Caregiver trained to provide supervision PMSV Supervision: Intermittent MD: Please consider changing trach tube to : Cuffless         General recommendations: Rehab consult Oral Care Recommendations: Oral care BID Follow up Recommendations: Other (comment) SLP Visit Diagnosis: Dysphagia, oropharyngeal phase (R13.12);Aphonia (R49.1) Plan: Continue with current plan of care       GO               Arrie Borrelli B. Murvin Natal, [facility], CCC-SLP Speech Language Pathologist Office: [phone] Pager: [phone]  Leigh Aurora 09/23/2020, 3:15 PM

## 2020-09-23 NOTE — Progress Notes (Signed)
Physical Therapy Treatment Patient Details Name: Joseph Ayala MRN: [id number] DOB: 1965-07-24 Today's Date: 09/23/2020    History of Present Illness Patient is a 55 y.o. male s/p tracheotomy on 09/14/2020 who presented to the ED with complaints of chest pain, cough, and SOB admitted for acute hypoxemic respiratory failure secondary to MRSA community-acquired pneumonia / aspiration with PMH significant for aortic aneurysm, HTN,  ETOH abuse, and opioid use.    PT Comments    Patient making good progress with acute PT and is highly motivated and agreeable to work with acute PT. Patient appears to be following commands more easily and responds verbally 2x "ok" when discussing next sequence to transfer/mobility. Pt continues to perseverate on being thirsty and discussed plan for speech evaluation. Patient continues to require Max assist for supine to sit EOB but demonstrated increased initiation with LE to move to EOB. He required Mod +2 assist for repeated Sit<>Stands from EOB and then recliner for functional LE strengthening. HR/SpO2 stable throughout with pt on 8L-35 on trach collar. Pt hypotensive with sit<>stands but this resolved with seated rest break, BP eneed as 116/78 mmHg. He will continue to benefit from skilled PT interventions to address imprairments and progress mobility towards PLOF. Recommending intense CIR follow up to optimize return to PLOF with rehab services.    09/22/20 1600  PT Visit Information  Last PT Received On 09/22/20  Assistance Needed +2  History of Present Illness Patient is a 55 y.o. male s/p tracheotomy on 09/14/2020 who presented to the ED with complaints of chest pain, cough, and SOB admitted for acute hypoxemic respiratory failure secondary to MRSA community-acquired pneumonia / aspiration with PMH significant for aortic aneurysm, HTN,  ETOH abuse, and opioid use.  Subjective Data  Patient Stated Goal get stronger  Precautions  Precautions Fall  Restrictions   Weight Bearing Restrictions No  Pain Assessment  Pain Assessment Faces  Faces Pain Scale 2  Pain Location generalized  Pain Descriptors / Indicators Grimacing  Pain Intervention(s) Monitored during session;Repositioned  Cognition  Arousal/Alertness Awake/alert  Behavior During Therapy WFL for tasks assessed/performed  Overall Cognitive Status Difficult to assess  General Comments pt's wife reports not at baseline for awareness or following commands.  Difficult to assess due to Tracheostomy  Bed Mobility  Overal bed mobility Needs Assistance  Bed Mobility Supine to Sit  Supine to sit Max assist;+2 for safety/equipment;+2 for physical assistance;HOB elevated  General bed mobility comments Max Assist +2 for supine to EOB. Pt with increased initiateion of bil LE movement towards EOB; assist required for Rt UE to reach towards Lt side of bed. Bed pad required to complete pivot and scoot anteriorly at EOB. pt more stable today once feet on floor.  Transfers  Overall transfer level Needs assistance  Equipment used 2 person hand held assist  Transfers Sit to/from [employer] Transfers  Sit to Stand Mod assist;+2 physical assistance;+2 safety/equipment  Stand pivot transfers Mod assist;+2 physical assistance;+2 safety/equipment  General transfer comment Pt with improved initiation of power up and Mod +2 assist required for sit<>stand. 2x from EOB and Mod assist for stand pivot/step to recliner. Pt's knee flexed in stance and manual blocking requried to facilitate knee extension. pt periodically able to raise trunk more upright and acheive more extended knee, hip, and trunk postur but fatigued to more flexed position. 3x sit<>stand completed from recliner with Mod +2 assist for power up.  Balance  Overall balance assessment Needs assistance  Sitting-balance support Feet supported;Bilateral upper  extremity supported  Sitting balance-Leahy Scale Poor  Standing balance support Bilateral upper  extremity supported  Standing balance-Leahy Scale Poor  General Comments  General comments (skin integrity, edema, etc.) Pt became slightly hypotensive (89/44) during transfers and BP assessed once sitting found ot be improved to 116/78 mmHg. SpO2 96% or greater throughout on 8L(35) with trach coller.  PT - End of Session  Equipment Utilized During Treatment Oxygen;Gait belt  Activity Tolerance Patient tolerated treatment well  Patient left in chair;with call bell/phone within reach;with family/visitor present  Nurse Communication Mobility status (pt restraints left off, wife in room at EOS)   PT - Assessment/Plan  PT Plan Current plan remains appropriate  PT Visit Diagnosis Muscle weakness (generalized) (M62.81);Difficulty in walking, not elsewhere classified (R26.2)  PT Frequency (ACUTE ONLY) Min 4X/week  Recommendations for Other Services Rehab consult;Speech consult  Follow Up Recommendations CIR  PT equipment None recommended by PT (TBA, defer to next facility)  AM-PAC PT "6 Clicks" Mobility Outcome Measure (Version 2)  Help needed turning from your back to your side while in a flat bed without using bedrails? 2  Help needed moving from lying on your back to sitting on the side of a flat bed without using bedrails? 2  Help needed moving to and from a bed to a chair (including a wheelchair)? 2  Help needed standing up from a chair using your arms (e.g., wheelchair or bedside chair)? 2  Help needed to walk in hospital room? 1  Help needed climbing 3-5 steps with a railing?  1  6 Click Score 10  Consider Recommendation of Discharge To: CIR/SNF/LTACH  PT Goal Progression  Progress towards PT goals Progressing toward goals  Acute Rehab PT Goals  PT Goal Formulation With patient/family  Time For Goal Achievement 09/28/20  Potential to Achieve Goals Good  PT Time Calculation  PT Start Time (ACUTE ONLY) 1136  PT Stop Time (ACUTE ONLY) 1207  PT Time Calculation (min) (ACUTE ONLY) 31  min  PT General Charges  $$ ACUTE PT VISIT 1 Visit  PT Treatments  $Therapeutic Activity 23-37 mins        Wynn Maudlin, DPT Acute Rehabilitation Services Office [phone] Pager [phone]    Anitra Lauth 09/23/2020, 7:31 AM

## 2020-09-23 NOTE — Progress Notes (Signed)
Modified Barium Swallow Progress Note  Patient Details  Name: Joseph Ayala MRN: [id number] Date of Birth: 01/11/1966  Today's Date: 09/23/2020  Modified Barium Swallow completed.  Full report located under Chart Review in the Imaging Section.  Brief recommendations include the following:  Clinical Impression Pt presents with mild oropharyngeal dysphagia, characterized as follows: Orally, pt exhibits poor bolus formation and posterior propulsion. Significantly extended oral prep noted on solid texture, which results in pt fatigue and increases aspiration risk. Pharyngeal swallow characterized by delayed swallow reflex across consistencies, with trigger seen at the vallecular sinus. No penetration or aspiration was seen on any texture given. Trace vallecular sinus residue seen on nectar thick liquids, puree, and solid. Vallecular and lateral channel residue noted on thin liquids. Pt participated in [facility] with and without  Passy-Muir Speaking Valve in place. There was no appreciable difference in swallow function and safety noted. Recommend beginning with full liquids and advancing as pt tolerates. With improvement in strength and endurance, pt should be able to advance to solid textures based on bedside presentation. SLP will follow for education and to determine readiness to advance consistencies.    Swallow Evaluation Recommendations  SLP Diet Recommendations: Other (Comment) (full liquid to start, advancing as pt strength and endurance improve)   Liquid Administration via: Cup;Straw   Medication Administration: Whole meds with liquid   Supervision: Full assist for feeding;Staff to assist with self feeding   Compensations: Minimize environmental distractions;Slow rate;Small sips/bites   Postural Changes: Remain semi-upright after after feeds/meals (Comment);Seated upright at 90 degrees   Oral Care Recommendations: Oral care BID   Other Recommendations: Have oral suction  available   Eyad Rochford B. Murvin Natal, [facility], CCC-SLP Speech Language Pathologist Office: [phone] Pager: [phone]  Leigh Aurora 09/23/2020,2:47 PM

## 2020-09-23 NOTE — TOC Progression Note (Signed)
Transition of Care [facility]) - Progression Note    Patient Details  Name: Joseph Ayala MRN: [id number] Date of Birth: March 05, 1966  Transition of Care [facility]) CM/SW Contact  Golda Acre, RN Phone Number: 09/23/2020, 8:05 AM  Clinical Narrative:    55 year old with dense, necrotizing MRSA pneumonia and empyema requiring ongoing ventilator support. s/p Trach.    Did TC all day, OOB to chair x 2 hours yesterday.Worked with PT.    Synopsis of assessment and plan:  Acute Hypoxemic Respiratory Failure: due to MRSA pneumonia and emypema. Chest  tube removed. S/p trach. Improving.  --ID following, ceftaroline  --Full vent support at night, TC trials during day   --VAP bundle    New CHF with reduced EF 35% 08/30/20:EF improved to 45% on TEE 3/11.  --Lasix 40 mg PO daily  --Appreciate cards assistance- re-engage when less critically ill  --Metop BID, start lisinopril 5 mg daily 3/17 for afterload reduction  --Increase BB, ACEi as tolerated     Afib:in NSR currently.  --s/p amio load, d/c 'd PO amioas tolerating BB  --Lovenox SQ for stroke ppx  --Metop 50 BID    Acute metabolic encephalopathy: Improving - multifactorial including delirium, h/o  substance abuse. Interfering with ventilator wean.  --ketamine infusion per palliative, weaning - keeping dose stable today  --clonazepam BID, librium taper complete, increased seroquel, continue clonidine  PLAN:  It has been discussed that patient may be extubated soon.  Now on trache collar at 28%.  Will follow may need snf placment or ltach if unable to wean further.  Expected Discharge Plan: Home/Self Care Barriers to Discharge: Continued Medical Work up  Expected Discharge Plan and Services Expected Discharge Plan: Home/Self Care   Discharge Planning Services: CM Consult   Living arrangements for the past 2 months: Single Family Home                                        Social Determinants of Health (SDOH) Interventions    Readmission Risk Interventions No flowsheet data found.

## 2020-09-24 DIAGNOSIS — J9601 Acute respiratory failure with hypoxia: Secondary | ICD-10-CM | POA: Diagnosis not present

## 2020-09-24 DIAGNOSIS — G9341 Metabolic encephalopathy: Secondary | ICD-10-CM | POA: Diagnosis not present

## 2020-09-24 DIAGNOSIS — J850 Gangrene and necrosis of lung: Secondary | ICD-10-CM | POA: Diagnosis not present

## 2020-09-24 LAB — GLUCOSE, CAPILLARY
Glucose-Capillary: 102 mg/dL — ABNORMAL HIGH (ref 70–99)
Glucose-Capillary: 90 mg/dL (ref 70–99)
Glucose-Capillary: 75 mg/dL (ref 70–99)
Glucose-Capillary: 104 mg/dL — ABNORMAL HIGH (ref 70–99)
Glucose-Capillary: 75 mg/dL (ref 70–99)

## 2020-09-24 LAB — BASIC METABOLIC PANEL
Anion gap: 9 (ref 5–15)
BUN: 27 mg/dL — ABNORMAL HIGH (ref 6–20)
CO2: 23 mmol/L (ref 22–32)
Calcium: 9.3 mg/dL (ref 8.9–10.3)
Chloride: 104 mmol/L (ref 98–111)
Creatinine, Ser: 0.56 mg/dL — ABNORMAL LOW (ref 0.61–1.24)
GFR, Estimated: >60 mL/min (ref >60)
Glucose, Bld: 97 mg/dL (ref 70–99)
Potassium: 3.8 mmol/L (ref 3.5–5.1)
Sodium: 136 mmol/L (ref 135–145)

## 2020-09-24 MED ORDER — PANTOPRAZOLE SODIUM 40 MG PO TBEC
40.0000 mg | DELAYED_RELEASE_TABLET | Freq: Every day | ORAL | Status: DC
  Administered 2020-09-24 – 2020-09-27 (×4): 40 mg via ORAL
  Filled 2020-09-24 (×4): qty 1

## 2020-09-24 MED ORDER — CHLORHEXIDINE GLUCONATE 0.12 % MT SOLN
15.0000 mL | Freq: Two times a day (BID) | OROMUCOSAL | Status: DC
  Administered 2020-09-24 – 2020-09-29 (×8): 15 mL via OROMUCOSAL
  Filled 2020-09-24 (×7): qty 15

## 2020-09-24 MED ORDER — FUROSEMIDE 40 MG PO TABS
40.0000 mg | ORAL_TABLET | Freq: Every day | ORAL | Status: DC
  Administered 2020-09-24 – 2020-09-29 (×6): 40 mg via ORAL
  Filled 2020-09-24 (×6): qty 1

## 2020-09-24 MED ORDER — DOCUSATE SODIUM 100 MG PO CAPS
100.0000 mg | ORAL_CAPSULE | Freq: Two times a day (BID) | ORAL | Status: DC
  Administered 2020-09-24 – 2020-09-29 (×3): 100 mg via ORAL
  Filled 2020-09-24 (×6): qty 1

## 2020-09-24 MED ORDER — LACTATED RINGERS IV SOLN: INTRAVENOUS | Status: DC

## 2020-09-24 MED ORDER — ADULT MULTIVITAMIN W/MINERALS CH
1.0000 | ORAL_TABLET | Freq: Every day | ORAL | Status: DC
  Administered 2020-09-24 – 2020-09-29 (×6): 1 via ORAL
  Filled 2020-09-24 (×6): qty 1

## 2020-09-24 MED ORDER — ORAL CARE MOUTH RINSE
15.0000 mL | Freq: Two times a day (BID) | OROMUCOSAL | Status: DC
  Administered 2020-09-24 – 2020-09-28 (×6): 15 mL via OROMUCOSAL

## 2020-09-24 MED ORDER — METOPROLOL TARTRATE 25 MG PO TABS
50.0000 mg | ORAL_TABLET | Freq: Two times a day (BID) | ORAL | Status: DC
  Administered 2020-09-24 – 2020-09-25 (×2): 50 mg via ORAL
  Filled 2020-09-24 (×3): qty 2

## 2020-09-24 MED ORDER — SODIUM CHLORIDE 0.9 % IV BOLUS
1000.0000 mL | Freq: Once | INTRAVENOUS | Status: AC
  Administered 2020-09-24: 1000 mL via INTRAVENOUS

## 2020-09-24 NOTE — Progress Notes (Signed)
PT Cancellation Note  Patient Details Name: Joseph Ayala MRN: [id number] DOB: 11-28-1965   Cancelled Treatment:     PT deferred this pm - RN advises pt hypotensive with BP drop to 64/43.  Will follow.   Geralyn Figiel 09/24/2020, 2:25 PM

## 2020-09-24 NOTE — Progress Notes (Signed)
Heard pt coughing up secretions from hallway and this RN entered room to find pt on his abdomen at the foot of the bed lying perpendicular to bed. flexiseal and small bore feeding tube had both been dislodged and pt was found to be confused. Critical Care RN was signaled to camera in to see patient and was notified at this time regarding feeding tube dislodgement. Will hold off reinsertion as pt is taking POs at this time. With three staff members pt was readjusted in the bed and cleaned up. Rectal tube reinserted and pt reoriented multiple times. Will continue to monitor.

## 2020-09-24 NOTE — Progress Notes (Signed)
NAME:  Joseph Ayala, MRN:  [id number], DOB:  15-Apr-1966, LOS: 20 ADMISSION DATE:  08/27/2020, CONSULTATION DATE:  2/20 REFERRING MD:  Doristine Bosworth CHIEF COMPLAINT:  Acute hypoxemic respiratory failure   Brief summary:  55 year old male on Suboxone for necrotizing myopathy admitted 2/19 with acute hypoxemic respiratory failure secondary to MRSA community-acquired pneumonia / aspiration. PCCM initially consulted for evaluation for bronchoscopy for possible mucous plugging. Transferred to ICU after worsening respiratory failure and mental status requiring intubation on 2/21. ICU course complicated by hypotension requiring pressors, chest tube placement for empyema s/p intrapleural lytics. Trach 3/9.   Hx of poorly explained myopathy, self medicating, pulse steroids / methotrexate.  Liberated from mechanical ventilation on 3/18  Pertinent  Medical History  Hypertension,  necrotizing myopathy without clear etiology > based on muscle biopsy (no inflammatory changes otherwise) has been evaluated by rheumatology and neurology at [facility] and [facility]; has been on steroids since 2020, started methotrexate 07/2019 opioid use on Suboxone  Significant Hospital Events: Including procedures, antibiotic start and stop dates in addition to other pertinent events   2/19 Arrived to ED for cough and chest pain x 1 week 2/20 Admitted to [facility] in early am. Intubated. Bronch ETT 2/21 >> 3/9 R Radial Aline 2/20 >> 3/4  L IJ TLC 2/21 >> 3/4 CTA Chest 2/19 >> right sided consolidation and ground glass opacities in the right and middle lobe. Background centrilobular emphysema present. No pulmonary embolism Ceftriaxone 2/20 >> 2/21 Azithro 2/20 >> 2/21 Vanc 2/21 >> 3/4  Zosyn 2/21 >> 2/23 2/22 Worsening respiratory status and vasopressor requirement. Agitation on vent 2/24 Off pressors. Right chest tube placement. Propofol changed versed as TRG increased R CT 2/24 >> 3/2 CT CAP 2/24 >> Interval enlargement of right pleural  effusion with some loculation. Progression of right airspace disease with cavitation in right middle/lower lobe. Anasarca 2/25 About 650 out of CT since insertion. Stable vent requirements. Sedation and agitation a challenge / librium added. Fever. TPA into chest tube 2/26 FOB for mucus plugging 2/28 Repeat CT with little residual pleural fluid, largely consolidated lung. On vent. Oral oxy added. CT Chest w/o 2/28 >> no large amount of residual pleural fluid present with most of the low-density abnormality in the right mid to lower chest felt to represent necrotic and consolidated RLL, RML. There is still some fluid component in the posterior pleural space superior to the tip of the indwelling chest tube  3/01 Ceiling of IV gtt's reduced, weaning on PSV 10/5 - weaned for 8h. Diuresis.   3/02 Sedation changed, hypotension, levophed initiated. FOB with BAL, chest tube removed.   3/04 Discontinue aline, PICC placed LUE LUE PICC 3/4 >>  Linezolid 3/4 >> 3/8  3/06 Improved fever curve on linezolid, challenging sedation 3/07 Difficulties with sedation > changed back to versed, dilaudid. Oral methadone added.  3/08 R white out on CXR / mucus plugging. Methadone stopped, re-challenge with ketamine. FOB with BAL.  CT Chest 3/8 >> persistent area of RML, RLL airspace disease with decreasing cavitation and increasing consolidation since prior study, increased airspace disease throughout the aerated RUL, consistent with progressive PNA. Stable LLL consolidation, trace L pleural effusion  Ceftaroline 3/8 >>  Trach Janace Hoard) 3/9 >>  3/09 Calmer on ketamine at higher dose, on propofol 20 mcg, dilaudid 59m Ketamine increased. Propofol stopped during day. 3/11 Remains on ketamine, dilaudid down to 137m/ weaning  TEE 3/11 >> EF 45-50%  No veg/ L to R ASD  3/12 added clonidine  to help with dilaudid wean completely off  drip stopped and still needed prn for HBP 3/12 added reglan 5 mg IV q6h  for emesis  Started  reglan p emesis 3/13 > resolved and ready to restart tf No supplemental dilaudid overnight  3/14-3/18.  added librium taper (completed 3/18). ketamine tapered slowly titrated down from 1.2 down to 0.8 ATC trials started on 3/15 and by 3/18 completely liberated from vent. SLP and PMV trials started 3/17 and MBS on 3/18. LMWH changed to [place] on 3/18 for afib. Dc foley  3/18 pulled out panda  Interim History / Subjective:   Pulled out panda tube overnight Got to go outside some   Objective   Blood pressure 131/88, pulse 81, temperature 98.8 F (37.1 C), temperature source Axillary, resp. rate 17, height _0  (1.803 m), weight 82.7 kg, SpO2 97 %.    FiO2 (%):  [28 %] 28 %   Intake/Output Summary (Last 24 hours) at 09/24/2020 0809 Last data filed at 09/24/2020 6568 Gross per 24 hour  Intake 3831.35 ml  Output 2300 ml  Net 1531.35 ml   Filed Weights   09/22/20 0500 09/23/20 0500 09/24/20 0412  Weight: 82.9 kg 80.7 kg 82.7 kg    Examination:  General:  Resting comfortably in bed HENT: NCAT OP clear tracheostomy in place PULM: CTA B, normal effort CV: RRR, no mgr GI: BS+, soft, nontender MSK: normal bulk and tone Neuro: awake, alert, drowsy MAEW   Labs/imaging personally reviewed    Blood culture 2/19 >> negative  BAL resp cx 2/20 >> MRSA BAL fungal cx 2/20 >> candida albicans BAL AFB cx 2/20 >> no AFB on smear >>  Urine strep ag 2/21 >> neg Urine legionella ag 2/21 >> neg BAL 2/26 >> MRSA  BAL 3/2 >> few MRSA   BAL 3/8 >> rare staph aureus >>  Rare MRSA  Resolved Hospital Problem list   Elevated LFTs - likely combination of hx and congestive hepatopathy AKI Aspiration pneumonia/pneumonitis in setting of altered mental status Septic shock (off pressors as of 2/24) Cardiogenic shock  Demand ischemia Parapneumonic Effusion versus Empyema:S/p chest tube placement 2/24, tPA 2/25, 2/26.  Chest tube removed 3/2.  Acute hypoxic respiratory failure New onset atrial  flutter/SVT secondary to sepsis  Assessment & Plan:  Tracheostomy/chronic respiratory failure with hypoxemia due to necrotizing MRSA pneumonia, persistently positive cultures Plan ~1 month of ceftaroline per ID Trach collar care per routine Maintain off mechanical ventilation pulm toilette measures prn  New onset HFrEF > improved on repeat echo Thoracic aortic aneurysm Monitor hemodynamics Continue metoprolol, lisinopril  Atrial fibrillation Tele Metoprolol eliquis  Acute metabolic encephalopathy/agitated delirium > improved greatly with slow ketamine wean Significant alcohol use and narcotic dependence Continue to wean off ketamine Continue seroquel, clonazepam, clonidine  Fluid and electrolyte imbalance Monitor BMET and UOP Replace electrolytes as needed  Moderate protein calorie malnutrition Tube feeding  Macrocytic anemia Monitor for bleeding Transfuse PRBC for Hgb < 7 gm/dL  Baseline necrotic myositis> at this point it's hard to determine if he has had any sort of recurrence of this as his physical deconditioning seems mostly in keeping with critical illness myopathy (he's surprisingly strong actually). Suspect restarting prednisone and methotrexate may do more harm than good right now given necrotizing pneumonia Wife indicates he only took prednisone, he wasn't taking methotrexate Maintain off prednisone/MTX for now Will need f/u with outpatient neuro  Best practice (evaluated daily)  Diet:  Oral Pain/Anxiety/Delirium protocol (if indicated): No VAP  protocol (if indicated): Not indicated DVT prophylaxis: Systemic AC GI prophylaxis: N/A Glucose control:  SSI No Central venous access:  Yes, and it is still needed Arterial line:  N/A Foley:  N/A Mobility:  OOB  PT consulted: Yes Last date of multidisciplinary goals of care discussion [3/13] called wife Ginger Halteman on 3/19 Code Status:  full code Disposition: remain in ICU  Labs   CBC: Recent Labs  Lab  09/19/20 1112 09/21/20 0649  WBC 18.0* 14.6*  HGB 10.0* 9.8*  HCT 30.7* 31.5*  MCV 93.3 97.2  PLT 435* 444*    Basic Metabolic Panel: Recent Labs  Lab 09/20/20 0440 09/21/20 0649 09/22/20 0620 09/23/20 0500 09/24/20 0413  NA 140 142 142 139 136  K 3.5 3.6 3.2* 3.7 3.8  CL 109 110 113* 105 104  CO2 _0 GLUCOSE 116* 115* 103* 99 97  BUN 21* 28* 28* 26* 27*  CREATININE 0.58* 0.59* 0.44* 0.48* 0.56*  CALCIUM 9.3 9.4 8.0* 9.1 9.3  MG  --  2.3  --   --   --    GFR: Estimated Creatinine Clearance: 113.7 mL/min (A) (by C-G formula based on SCr of 0.56 mg/dL (L)). Recent Labs  Lab 09/19/20 1112 09/21/20 0649  WBC 18.0* 14.6*    Liver Function Tests: No results for input(s): AST, ALT, ALKPHOS, BILITOT, PROT, ALBUMIN in the last 168 hours. No results for input(s): LIPASE, AMYLASE in the last 168 hours. No results for input(s): AMMONIA in the last 168 hours.  ABG    Component Value Date/Time   PHART 7.363 09/09/2020 1300   PCO2ART 44.5 09/09/2020 1300   PO2ART 71.7 (L) 09/09/2020 1300   HCO3 24.7 09/09/2020 1300   ACIDBASEDEF 0.2 09/09/2020 1300   O2SAT 92.5 09/09/2020 1300     Coagulation Profile: No results for input(s): INR, PROTIME in the last 168 hours.  Cardiac Enzymes: No results for input(s): CKTOTAL, CKMB, CKMBINDEX, TROPONINI in the last 168 hours.  HbA1C: No results found for: HGBA1C  CBG: Recent Labs  Lab 09/23/20 1156 09/23/20 1549 09/23/20 1928 09/23/20 2327 09/24/20 0343  GLUCAP 129* 119* 119* 91 102*       Critical care time: n/a, > 35 minutes spent total reviewing chart, discussing plan with wife making modifications to care plan   Roselie Awkward, MD [place] [facility] Pager: [phone] Cell: [phone] If no response, please call [phone] until 7pm After 7:00 pm call Elink  [phone]

## 2020-09-25 DIAGNOSIS — J9601 Acute respiratory failure with hypoxia: Secondary | ICD-10-CM | POA: Diagnosis not present

## 2020-09-25 DIAGNOSIS — J850 Gangrene and necrosis of lung: Secondary | ICD-10-CM | POA: Diagnosis not present

## 2020-09-25 DIAGNOSIS — G9341 Metabolic encephalopathy: Secondary | ICD-10-CM | POA: Diagnosis not present

## 2020-09-25 LAB — GLUCOSE, CAPILLARY
Glucose-Capillary: 105 mg/dL — ABNORMAL HIGH (ref 70–99)
Glucose-Capillary: 87 mg/dL (ref 70–99)
Glucose-Capillary: 88 mg/dL (ref 70–99)

## 2020-09-25 MED ORDER — GABAPENTIN 100 MG PO CAPS
100.0000 mg | ORAL_CAPSULE | Freq: Three times a day (TID) | ORAL | Status: DC
  Administered 2020-09-25 – 2020-09-29 (×13): 100 mg via ORAL
  Filled 2020-09-25 (×13): qty 1

## 2020-09-25 MED ORDER — IBUPROFEN 200 MG PO TABS: 400.0000 mg | ORAL_TABLET | Freq: Four times a day (QID) | ORAL | Status: DC | PRN

## 2020-09-25 MED ORDER — CLONIDINE HCL 0.1 MG PO TABS
0.1000 mg | ORAL_TABLET | Freq: Three times a day (TID) | ORAL | Status: AC
  Administered 2020-09-25 (×2): 0.1 mg via ORAL
  Filled 2020-09-25: qty 1

## 2020-09-25 MED ORDER — ACETAMINOPHEN 325 MG PO TABS
650.0000 mg | ORAL_TABLET | Freq: Four times a day (QID) | ORAL | Status: DC | PRN
  Administered 2020-09-26 – 2020-09-29 (×4): 650 mg via ORAL
  Filled 2020-09-25 (×4): qty 2

## 2020-09-25 NOTE — Progress Notes (Signed)
Physical Therapy Treatment Patient Details Name: Joseph Ayala MRN: [id number] DOB: 08/17/1965 Today's Date: 09/25/2020    History of Present Illness Patient is a 55 y.o. male s/p tracheotomy on 09/14/2020 who presented to the ED with complaints of chest pain, cough, and SOB admitted for acute hypoxemic respiratory failure secondary to MRSA community-acquired pneumonia / aspiration with PMH significant for aortic aneurysm, HTN,  ETOH abuse, and opioid use.    PT Comments    Pt very cooperative and with noted improvement in ability to follow cues, in decreased assist level required and in activity tolerance.  Pt able to ambulate 13 ft in room with RW limited by fatigue but not by orthostatic hypotension.   Follow Up Recommendations  CIR     Equipment Recommendations  None recommended by PT    Recommendations for Other Services Rehab consult;Speech consult     Precautions / Restrictions Precautions Precautions: Fall Precaution Comments: rectal tube, impulsive/cognitive impaired, ketamine drip Restrictions Weight Bearing Restrictions: No    Mobility  Bed Mobility Overal bed mobility: Needs Assistance Bed Mobility: Rolling;Sidelying to Sit Rolling: Min assist Sidelying to sit: Min assist;Mod assist       General bed mobility comments: Cues for log roll techinque and assist to complete tranistion to EOB sitting    Transfers Overall transfer level: Needs assistance Equipment used: Rolling walker (2 wheeled) Transfers: Sit to/from Stand Sit to Stand: +2 physical assistance;+2 safety/equipment;Min assist;Mod assist         General transfer comment: cues for LE management and use of UEs to self assist; assist to bring wt up and fwd and to balance in initial standing  Ambulation/Gait Ambulation/Gait assistance: Min assist;+2 physical assistance;+2 safety/equipment Gait Distance (Feet): 13 Feet Assistive device: Rolling walker (2 wheeled) Gait Pattern/deviations: Step-to  pattern;Decreased step length - right;Decreased step length - left;Shuffle;Trunk flexed Gait velocity: decr   General Gait Details: cues for posture, position from RW.  Physical assist for balance/support and RW management   Stairs             Wheelchair Mobility    Modified Rankin (Stroke Patients Only)       Balance Overall balance assessment: Needs assistance Sitting-balance support: Single extremity supported Sitting balance-Leahy Scale: Fair     Standing balance support: Bilateral upper extremity supported Standing balance-Leahy Scale: Poor                              Cognition Arousal/Alertness: Awake/alert Behavior During Therapy: Restless;Impulsive Overall Cognitive Status: No family/caregiver present to determine baseline cognitive functioning                                 General Comments: Increased processing time, impulsive with movement      Exercises      General Comments        Pertinent Vitals/Pain Pain Assessment: No/denies pain Pain Intervention(s): Limited activity within patient's tolerance;Monitored during session    Home Living                      Prior Function            PT Goals (current goals can now be found in the care plan section) Acute Rehab PT Goals Patient Stated Goal: get stronger PT Goal Formulation: With patient Time For Goal Achievement: 09/28/20 Potential to Achieve Goals: Good Progress towards  PT goals: Progressing toward goals    Frequency    Min 4X/week      PT Plan Current plan remains appropriate    Co-evaluation              AM-PAC PT "6 Clicks" Mobility   Outcome Measure  Help needed turning from your back to your side while in a flat bed without using bedrails?: A Little Help needed moving from lying on your back to sitting on the side of a flat bed without using bedrails?: A Lot Help needed moving to and from a bed to a chair (including a  wheelchair)?: A Lot Help needed standing up from a chair using your arms (e.g., wheelchair or bedside chair)?: A Lot Help needed to walk in hospital room?: A Lot Help needed climbing 3-5 steps with a railing? : Total 6 Click Score: 12    End of Session Equipment Utilized During Treatment: Gait belt Activity Tolerance: Patient tolerated treatment well;Patient limited by fatigue Patient left: in chair;with call bell/phone within reach;with nursing/sitter in room Nurse Communication: Mobility status PT Visit Diagnosis: Muscle weakness (generalized) (M62.81);Difficulty in walking, not elsewhere classified (R26.2)     Time: 1007-1030 PT Time Calculation (min) (ACUTE ONLY): 23 min  Charges:  $Gait Training: 8-22 mins $Therapeutic Activity: 8-22 mins                     Joseph Ayala PT Acute Rehabilitation Services Pager [phone] Office [phone]    Izzah Pasqua 09/25/2020, 12:28 PM

## 2020-09-25 NOTE — Progress Notes (Signed)
  Speech Language Pathology Treatment: Dysphagia  Patient Details Name: Joseph Ayala MRN: [id number] DOB: [birth year] Today's Date: 09/25/2020 Time: 1220-1240 SLP Time Calculation (min) (ACUTE ONLY): 20 min  Assessment / Plan / Recommendation Clinical Impression  Patient seen for dysphagia tx with wife present in room for portion of session. Patient was napping but awoke easily and was able to maintain alertness for session without difficulty. Patient was repositioned to be sitting upright in recliner chair and tolerated bites of graham crackers and sips of thin liquids (water) via straw sips. Patient able to bring food and liquids to mouth but does require some setup assistance and extra time to perform. No noticeable fatigue observed with mastication, however mastication was mildly delayed. No overt s/s of aspiration or penetration and patient's oxygen saturations and RR were both within average range. SLP is recommending to upgrade patient to Dys 3 solids at this time.   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 PCCM initially consulted for evaluation for bronchoscopy for possible mucous plugging. Transferred to ICU after worsening respiratory failure and mental status requiring intubation on 2/21. ICU course complicated by hypotension requiring pressors, chest tube placement for empyema s/p intrapleural lytics. Trach 3/9. 3/17 attempting ATC 24/7. Holding ketamine at 1 trach collar trials during the day, vent at night.  Pt was intubated 17 days, trached on 3/9 and now on trach collar trials 24/7.  Per RN, he fatigues easily. He desires po intake.  Small right  pleural effusion with right base atelectasis. Lungs elsewhere clear. per most recent CXR. PMSV and swallow evaluation ordered.      SLP Plan  Continue with current plan of care       Recommendations   Diet recommendations: Dysphagia 3 (mechanical soft);Thin liquid Liquids provided via: Cup;Straw Medication Administration: Whole meds with liquid Supervision: Full supervision/cueing for compensatory strategies;Staff to assist with self feeding Compensations: Minimize environmental distractions;Slow rate;Small sips/bites Postural Changes and/or Swallow Maneuvers: Seated upright 90 degrees;Upright 30-60 min after meal      Patient may use Passy-Muir Speech Valve: Intermittently with supervision;During all therapies with supervision;Caregiver trained to provide supervision PMSV Supervision: Intermittent MD: Please consider changing trach tube to : Cuffless         General recommendations: Rehab consult Oral Care Recommendations: Oral care BID Follow up Recommendations: Outpatient SLP;24 hour supervision/assistance;Home health SLP SLP Visit Diagnosis: Dysphagia, oropharyngeal phase (R13.12) Plan: Continue with current plan of care       GO                Angela Nevin, MA, CCC-SLP Speech Therapy

## 2020-09-25 NOTE — Progress Notes (Signed)
[facility] P[facility]  Wife updated by phone  Heber [place], MD [place] [facility] Pager: [phone] Cell: [phone] If no response, please call [phone] until 7pm After 7:00 pm call Elink  [phone]

## 2020-09-25 NOTE — Progress Notes (Signed)
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 PCCM initially consulted for evaluation for bronchoscopy for possible mucous plugging. Transferred to ICU after worsening respiratory failure and mental status requiring intubation on 2/21. ICU course complicated by hypotension requiring pressors, chest tube placement for empyema s/p intrapleural lytics. Trach 3/9.   Hx of poorly explained myopathy, self medicating, pulse steroids / methotrexate.  Liberated from mechanical ventilation on 3/18  Pertinent  Medical History  Hypertension,  necrotizing myopathy without clear etiology > based on muscle biopsy (no inflammatory changes otherwise) has been evaluated by rheumatology and neurology at [facility] and [facility]; has been on steroids since 2020, started methotrexate 07/2019 opioid use on Suboxone  Significant Hospital Events: Including procedures, antibiotic start and stop dates in addition to other pertinent events   2/19 Arrived to ED for cough and chest pain x 1 week 2/20 Admitted to [facility] in early am. Intubated. Bronch ETT 2/21 >> 3/9 R Radial Aline 2/20 >> 3/4  L IJ TLC 2/21 >> 3/4 CTA Chest 2/19 >> right sided consolidation and ground glass opacities in the right and middle lobe. Background centrilobular emphysema present. No pulmonary embolism Ceftriaxone 2/20 >> 2/21 Azithro 2/20 >> 2/21 Vanc 2/21 >> 3/4  Zosyn 2/21 >> 2/23 2/22 Worsening respiratory status and vasopressor requirement. Agitation on vent 2/24 Off pressors. Right chest tube placement. Propofol changed versed as TRG increased R CT 2/24 >> 3/2 CT CAP 2/24 >> Interval enlargement of right pleural  effusion with some loculation. Progression of right airspace disease with cavitation in right middle/lower lobe. Anasarca 2/25 About 650 out of CT since insertion. Stable vent requirements. Sedation and agitation a challenge / librium added. Fever. TPA into chest tube 2/26 FOB for mucus plugging 2/28 Repeat CT with little residual pleural fluid, largely consolidated lung. On vent. Oral oxy added. CT Chest w/o 2/28 >> no large amount of residual pleural fluid present with most of the low-density abnormality in the right mid to lower chest felt to represent necrotic and consolidated RLL, RML. There is still some fluid component in the posterior pleural space superior to the tip of the indwelling chest tube  3/01 Ceiling of IV gtt's reduced, weaning on PSV 10/5 - weaned for 8h. Diuresis.   3/02 Sedation changed, hypotension, levophed initiated. FOB with BAL, chest tube removed.   3/04 Discontinue aline, PICC placed LUE LUE PICC 3/4 >>  Linezolid 3/4 >> 3/8  3/06 Improved fever curve on linezolid, challenging sedation 3/07 Difficulties with sedation > changed back to versed, dilaudid. Oral methadone added.  3/08 R white out on CXR / mucus plugging. Methadone stopped, re-challenge with ketamine. FOB with BAL.  CT Chest 3/8 >> persistent area of RML, RLL airspace disease with decreasing cavitation and increasing consolidation since prior study, increased airspace disease throughout the aerated RUL, consistent with progressive PNA. Stable LLL consolidation, trace L pleural effusion  Ceftaroline 3/8 >>  Trach Janace Hoard) 3/9 >>  3/09 Calmer on ketamine at higher dose, on propofol 20 mcg, dilaudid 89m Ketamine increased. Propofol stopped during day. 3/11 Remains on ketamine, dilaudid down to 131m/ weaning  TEE 3/11 >> EF 45-50%  No veg/ L to R ASD  3/12 added clonidine  to help with dilaudid wean completely off  drip stopped and still needed prn for HBP 3/12 added reglan 5 mg IV q6h  for emesis  Started  reglan p emesis 3/13 > resolved and ready to restart tf No supplemental dilaudid overnight  3/14-3/18.  added librium taper (completed 3/18). ketamine tapered slowly titrated down from 1.2 down to 0.8 ATC trials started on 3/15 and by 3/18 completely liberated from vent. SLP and PMV trials started 3/17 and MBS on 3/18. LMWH changed to [place] on 3/18 for afib. Dc foley  3/18 pulled out panda 3/19 some hypotention 3/20 foot pain, reduced ketamine  Interim History / Subjective:   Awake, alert Still complaining of foot pain> he says it is burning like pins/needles Had hypotension after switching to oral meds yesterday, improved overnight Metoprolol held last night   Objective   Blood pressure (!) 150/98, pulse 84, temperature 98.7 F (37.1 C), temperature source Oral, resp. rate 18, height _0  (1.803 m), weight 84 kg, SpO2 95 %.    FiO2 (%):  [28 %] 28 %   Intake/Output Summary (Last 24 hours) at 09/25/2020 0801 Last data filed at 09/25/2020 0600 Gross per 24 hour  Intake 3011.9 ml  Output 3825 ml  Net -813.1 ml   Filed Weights   09/23/20 0500 09/24/20 0412 09/25/20 0500  Weight: 80.7 kg 82.7 kg 84 kg    Examination:  General:  Resting comfortably in bed HENT: NCAT OP clear trach in place PULM: CTA B, normal effort CV: RRR, no mgr GI: BS+, soft, nontender MSK: normal bulk and tone> no joint redness/tenderness in feet,  Derm: feet: no redness or swelling Neuro: awake, alert, no distress, MAEW    Labs/imaging personally reviewed    Blood culture 2/19 >> negative  BAL resp cx 2/20 >> MRSA BAL fungal cx 2/20 >> candida albicans BAL AFB cx 2/20 >> no AFB on smear >>  Urine strep ag 2/21 >> neg Urine legionella ag 2/21 >> neg BAL 2/26 >> MRSA  BAL 3/2 >> few MRSA   BAL 3/8 >> rare staph aureus >>  Rare MRSA  Resolved Hospital Problem list   Elevated LFTs - likely combination of hx and congestive hepatopathy AKI Aspiration pneumonia/pneumonitis in setting of altered  mental status Septic shock (off pressors as of 2/24) Cardiogenic shock  Demand ischemia Parapneumonic Effusion versus Empyema:S/p chest tube placement 2/24, tPA 2/25, 2/26.  Chest tube removed 3/2.  Acute hypoxic respiratory failure New onset atrial flutter/SVT secondary to sepsis  Assessment & Plan:  Tracheostomy/chronic respiratory failure with hypoxemia due to necrotizing MRSA pneumonia, persistently positive cultures Plan ~1 month of ceftaroline per ID Trach collar care per routine Maintain off mechanical ventilation pulm toilette measures prn  New onset HFrEF > improved on repeat echo Thoracic aortic aneurysm Some hypotension on 3/20 after changing medications to pills Decrease clonidine, wean off Maintain metoprolol and lisinopril  Neuropathic pain in feet> unclear etiology, ketamine related? No reports noted in literature Decrease ketamine dose gabapentin  Atrial fibrillation Tele Metoprolol Eliquis  Acute metabolic encephalopathy/agitated delirium > improved greatly with slow ketamine wean Significant alcohol use and narcotic dependence Decrease ketamine to 0.10m/kg dosing today, wean again in 48 hours Continue seroquel Would decrease clonazepam on 3/21 if still improving  Fluid and electrolyte imbalance Monitor BMET and UOP Replace electrolytes as needed   Moderate protein calorie malnutrition Tube feeding  Macrocytic anemia Monitor for bleeding Transfuse PRBC for Hgb < 7 gm/dL  Baseline necrotic myositis> at  this point it's hard to determine if he has had any sort of recurrence of this as his physical deconditioning seems mostly in keeping with critical illness myopathy (he's surprisingly strong actually). Suspect restarting prednisone and methotrexate may do more harm than good right now given necrotizing pneumonia Wife indicates he only took prednisone, he wasn't taking methotrexate Maintain off prednisone/MTX for now Will need f/u with outpatient  neuro  Best practice (evaluated daily)  Diet:  Oral Pain/Anxiety/Delirium protocol (if indicated): No VAP protocol (if indicated): Not indicated DVT prophylaxis: Systemic AC GI prophylaxis: N/A Glucose control:  SSI No Central venous access:  Yes, and it is still needed Arterial line:  N/A Foley:  N/A Mobility:  OOB  PT consulted: Yes Last date of multidisciplinary goals of care discussion [3/13] called wife Ginger Ruesch on 3/19 Code Status:  full code Disposition: remain in ICU  Labs   CBC: Recent Labs  Lab 09/19/20 1112 09/21/20 0649  WBC 18.0* 14.6*  HGB 10.0* 9.8*  HCT 30.7* 31.5*  MCV 93.3 97.2  PLT 435* 444*    Basic Metabolic Panel: Recent Labs  Lab 09/20/20 0440 09/21/20 0649 09/22/20 0620 09/23/20 0500 09/24/20 0413  NA 140 142 142 139 136  K 3.5 3.6 3.2* 3.7 3.8  CL 109 110 113* 105 104  CO2 _0 GLUCOSE 116* 115* 103* 99 97  BUN 21* 28* 28* 26* 27*  CREATININE 0.58* 0.59* 0.44* 0.48* 0.56*  CALCIUM 9.3 9.4 8.0* 9.1 9.3  MG  --  2.3  --   --   --    GFR: Estimated Creatinine Clearance: 113.7 mL/min (A) (by C-G formula based on SCr of 0.56 mg/dL (L)). Recent Labs  Lab 09/19/20 1112 09/21/20 0649  WBC 18.0* 14.6*    Liver Function Tests: No results for input(s): AST, ALT, ALKPHOS, BILITOT, PROT, ALBUMIN in the last 168 hours. No results for input(s): LIPASE, AMYLASE in the last 168 hours. No results for input(s): AMMONIA in the last 168 hours.  ABG    Component Value Date/Time   PHART 7.363 09/09/2020 1300   PCO2ART 44.5 09/09/2020 1300   PO2ART 71.7 (L) 09/09/2020 1300   HCO3 24.7 09/09/2020 1300   ACIDBASEDEF 0.2 09/09/2020 1300   O2SAT 92.5 09/09/2020 1300     Coagulation Profile: No results for input(s): INR, PROTIME in the last 168 hours.  Cardiac Enzymes: No results for input(s): CKTOTAL, CKMB, CKMBINDEX, TROPONINI in the last 168 hours.  HbA1C: No results found for: HGBA1C  CBG: Recent Labs  Lab  09/24/20 1539 09/24/20 2019 09/25/20 0005 09/25/20 0328 09/25/20 0744  GLUCAP 104* 75 105* 88 87       CC time: N/A   Roselie Awkward, MD [place] P[facility]M Pager: [phone] Cell: [phone] If no response, please call [phone] until 7pm After 7:00 pm call Elink  [phone]

## 2020-09-26 DIAGNOSIS — J850 Gangrene and necrosis of lung: Secondary | ICD-10-CM | POA: Diagnosis not present

## 2020-09-26 DIAGNOSIS — J9601 Acute respiratory failure with hypoxia: Secondary | ICD-10-CM | POA: Diagnosis not present

## 2020-09-26 DIAGNOSIS — G9341 Metabolic encephalopathy: Secondary | ICD-10-CM | POA: Diagnosis not present

## 2020-09-26 LAB — BASIC METABOLIC PANEL
Anion gap: 9 (ref 5–15)
BUN: 14 mg/dL (ref 6–20)
CO2: 23 mmol/L (ref 22–32)
Calcium: 9 mg/dL (ref 8.9–10.3)
Chloride: 102 mmol/L (ref 98–111)
Creatinine, Ser: 0.74 mg/dL (ref 0.61–1.24)
GFR, Estimated: >60 mL/min (ref >60)
Glucose, Bld: 134 mg/dL — ABNORMAL HIGH (ref 70–99)
Potassium: 3.3 mmol/L — ABNORMAL LOW (ref 3.5–5.1)
Sodium: 134 mmol/L — ABNORMAL LOW (ref 135–145)

## 2020-09-26 LAB — VITAMIN B12: Vitamin B-12: 914 pg/mL (ref 180–914)

## 2020-09-26 LAB — GLUCOSE, CAPILLARY: Glucose-Capillary: 98 mg/dL (ref 70–99)

## 2020-09-26 LAB — MAGNESIUM: Magnesium: 2 mg/dL (ref 1.7–2.4)

## 2020-09-26 MED ORDER — CLONAZEPAM 1 MG PO TABS
1.0000 mg | ORAL_TABLET | Freq: Three times a day (TID) | ORAL | Status: DC
  Administered 2020-09-26: 1 mg
  Filled 2020-09-26: qty 1

## 2020-09-26 MED ORDER — LOPERAMIDE HCL 2 MG PO CAPS
2.0000 mg | ORAL_CAPSULE | ORAL | Status: DC | PRN
  Administered 2020-09-26: 2 mg via ORAL
  Filled 2020-09-26: qty 1

## 2020-09-26 MED ORDER — METOPROLOL TARTRATE 25 MG PO TABS
25.0000 mg | ORAL_TABLET | Freq: Two times a day (BID) | ORAL | Status: DC
  Filled 2020-09-26: qty 1

## 2020-09-26 MED ORDER — POTASSIUM CHLORIDE 20 MEQ PO PACK
60.0000 meq | PACK | Freq: Once | ORAL | Status: AC
  Administered 2020-09-26: 60 meq via ORAL
  Filled 2020-09-26: qty 3

## 2020-09-26 MED ORDER — CAPSAICIN 0.075 % EX CREA
TOPICAL_CREAM | Freq: Four times a day (QID) | CUTANEOUS | Status: DC
  Administered 2020-09-26 – 2020-09-29 (×3): 1 via TOPICAL
  Filled 2020-09-26 (×2): qty 60

## 2020-09-26 MED ORDER — CLONAZEPAM 1 MG PO TABS
1.0000 mg | ORAL_TABLET | Freq: Three times a day (TID) | ORAL | Status: DC
Start: 2020-09-26 — End: 2020-09-28
  Administered 2020-09-26 – 2020-09-28 (×5): 1 mg via ORAL
  Filled 2020-09-26 (×5): qty 1

## 2020-09-26 MED ORDER — FOLIC ACID 1 MG PO TABS
1.0000 mg | ORAL_TABLET | Freq: Every day | ORAL | Status: DC
  Administered 2020-09-26 – 2020-09-29 (×4): 1 mg via ORAL
  Filled 2020-09-26 (×4): qty 1

## 2020-09-26 MED ORDER — METOPROLOL TARTRATE 12.5 MG HALF TABLET
12.5000 mg | ORAL_TABLET | Freq: Two times a day (BID) | ORAL | Status: DC
  Administered 2020-09-26 – 2020-09-28 (×4): 12.5 mg via ORAL
  Filled 2020-09-26 (×6): qty 1

## 2020-09-26 MED ORDER — TRAMADOL HCL 50 MG PO TABS
50.0000 mg | ORAL_TABLET | Freq: Four times a day (QID) | ORAL | Status: DC | PRN
  Administered 2020-09-26 – 2020-09-27 (×2): 50 mg via ORAL
  Filled 2020-09-26 (×2): qty 1

## 2020-09-26 MED ORDER — BETHANECHOL CHLORIDE 10 MG PO TABS
10.0000 mg | ORAL_TABLET | Freq: Three times a day (TID) | ORAL | Status: DC
  Administered 2020-09-26 – 2020-09-29 (×10): 10 mg via ORAL
  Filled 2020-09-26 (×11): qty 1

## 2020-09-26 MED ORDER — THIAMINE HCL 100 MG PO TABS
100.0000 mg | ORAL_TABLET | Freq: Every day | ORAL | Status: DC
  Administered 2020-09-26 – 2020-09-29 (×4): 100 mg via ORAL
  Filled 2020-09-26 (×4): qty 1

## 2020-09-26 MED ORDER — APIXABAN 5 MG PO TABS
5.0000 mg | ORAL_TABLET | Freq: Two times a day (BID) | ORAL | Status: DC
  Administered 2020-09-26 – 2020-09-29 (×7): 5 mg via ORAL
  Filled 2020-09-26 (×7): qty 1

## 2020-09-26 MED ORDER — CLOTRIMAZOLE-BETAMETHASONE 1-0.05 % EX CREA
TOPICAL_CREAM | Freq: Two times a day (BID) | CUTANEOUS | Status: DC
  Administered 2020-09-26 – 2020-09-29 (×3): 1 via TOPICAL
  Filled 2020-09-26 (×2): qty 15

## 2020-09-26 MED ORDER — ENSURE ENLIVE PO LIQD
237.0000 mL | Freq: Three times a day (TID) | ORAL | Status: DC
  Administered 2020-09-26 – 2020-09-29 (×8): 237 mL via ORAL

## 2020-09-26 MED ORDER — LISINOPRIL 2.5 MG PO TABS
5.0000 mg | ORAL_TABLET | Freq: Every day | ORAL | Status: DC
  Filled 2020-09-26: qty 2

## 2020-09-26 MED ORDER — QUETIAPINE FUMARATE 100 MG PO TABS
200.0000 mg | ORAL_TABLET | Freq: Two times a day (BID) | ORAL | Status: DC
  Administered 2020-09-26 – 2020-09-27 (×4): 200 mg via ORAL
  Filled 2020-09-26 (×4): qty 2

## 2020-09-26 MED ORDER — IBUPROFEN 200 MG PO TABS
400.0000 mg | ORAL_TABLET | Freq: Four times a day (QID) | ORAL | Status: DC | PRN
  Administered 2020-09-27: 400 mg via ORAL
  Filled 2020-09-26: qty 2

## 2020-09-26 NOTE — Progress Notes (Signed)
RT NOTE:  Pt trach capped per CCM order. Pt tolerating this well while being on Room air. CCM NP at bedside for capping. Trial to last 24 hours. Vitals are stable at this time, RT will continue to monitor.

## 2020-09-26 NOTE — Progress Notes (Signed)
NAME:  Joseph Ayala, MRN:  [id number], DOB:  1966-03-25, LOS: 28 ADMISSION DATE:  08/27/2020, CONSULTATION DATE:  2/20 REFERRING MD:  Doristine Bosworth CHIEF COMPLAINT:  Acute hypoxemic respiratory failure   Brief summary:  55 year old male on Suboxone for necrotizing myopathy admitted 2/19 with acute hypoxemic respiratory failure secondary to MRSA community-acquired pneumonia / aspiration. PCCM initially consulted for evaluation for bronchoscopy for possible mucous plugging. Transferred to ICU after worsening respiratory failure and mental status requiring intubation on 2/21. ICU course complicated by hypotension requiring pressors, chest tube placement for empyema s/p intrapleural lytics. Trach 3/9.   Hx of poorly explained myopathy, self medicating, pulse steroids / methotrexate.  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 Intubated. Bronch ETT 2/21 >> 3/9 R Radial Aline 2/20 >> 3/4  L IJ TLC 2/21 >> 3/4 CTA Chest 2/19 >> right sided consolidation and ground glass opacities in the right and middle lobe. Background centrilobular emphysema present. No pulmonary embolism Ceftriaxone 2/20 >> 2/21 Azithro 2/20 >> 2/21 Vanc 2/21 >> 3/4  Zosyn 2/21 >> 2/23 2/22 Worsening respiratory status and vasopressor requirement. Agitation on vent 2/24 Off pressors. Right chest tube placement. Propofol changed versed as TRG increased R CT 2/24 >> 3/2 CT CAP 2/24 >> Interval enlargement of right pleural  effusion with some loculation. Progression of right airspace disease with cavitation in right middle/lower lobe. Anasarca 2/25 About 650 out of CT since insertion. Stable vent requirements. Sedation and agitation a challenge / librium added. Fever. TPA into chest tube 2/26 FOB for mucus plugging 2/28 Repeat CT with little residual pleural fluid, largely consolidated lung. On vent. Oral oxy added. CT Chest w/o 2/28 >> no large amount of residual pleural fluid present with most of the low-density abnormality in the right mid to lower chest felt to represent necrotic and consolidated RLL, RML. There is still some fluid component in the posterior pleural space superior to the tip of the indwelling chest tube  3/01 Ceiling of IV gtt's reduced, weaning on PSV 10/5 - weaned for 8h. Diuresis.   3/02 Sedation changed, hypotension, levophed initiated. FOB with BAL, chest tube removed.   3/04 Discontinue aline, PICC placed LUE LUE PICC 3/4 >>  Linezolid 3/4 >> 3/8  3/06 Improved fever curve on linezolid, challenging sedation 3/07 Difficulties with sedation > changed back to versed, dilaudid. Oral methadone added.  3/08 R white out on CXR / mucus plugging. Methadone stopped, re-challenge with ketamine. FOB with BAL.  CT Chest 3/8 >> persistent area of RML, RLL airspace disease with decreasing cavitation and increasing consolidation since prior study, increased airspace disease throughout the aerated RUL, consistent with progressive PNA. Stable LLL consolidation, trace L pleural effusion  Ceftaroline 3/8 >>  Trach Janace Hoard) 3/9 >>  3/09 Calmer on ketamine at higher dose, on propofol 20 mcg, dilaudid 97m Ketamine increased. Propofol stopped during day. 3/11 Remains on ketamine, dilaudid down to 167m/ weaning  TEE 3/11 >> EF 45-50%  No veg/ L to R ASD  3/12 added clonidine  to help with dilaudid wean completely off  drip stopped and still needed prn for HBP 3/12 added reglan 5 mg IV q6h  for emesis  Started  reglan p emesis 3/13 > resolved and ready to restart tf No supplemental dilaudid overnight  3/14-3/18.  added librium taper (completed 3/18). ketamine tapered slowly titrated down from 1.2 down to 0.8 ATC trials started on 3/15 and by 3/18 completely liberated from vent. SLP and PMV trials started 3/17 and MBS on 3/18. LMWH changed to [place] on 3/18 for afib. Dc foley  3/18 pulled out panda 3/19 some hypotention, clonidine weaned off. 3/20 neuropathic pain ketamine reduced and neurontin added.  3/21 attempting trach capping trial. Dec Ketamine to 0.39m/kg/hr   Interim History / Subjective:   Resting comfortably    Objective   Blood pressure 109/69, pulse 82, temperature 98.6 F (37 C), temperature source Axillary, resp. rate 17, height _0  (1.803 m), weight 84 kg, SpO2 92 %.    FiO2 (%):  [21 %] 21 %   Intake/Output Summary (Last 24 hours) at 09/26/2020 0811 Last data filed at 09/26/2020 0610 Gross per 24 hour  Intake 1442.28 ml  Output 4925 ml  Net -3482.72 ml   Filed Weights   09/23/20 0500 09/24/20 0412 09/25/20 0500  Weight: 80.7 kg 82.7 kg 84 kg    Examination: General this is a 55year old male resting in bed no distress HENT NCAT trach deflated.  Pulm dec'd t/o no accessory use Card rrr abd soft not tender Ext warm and dry  Neuro intact gu cl yellow    Labs/imaging personally reviewed    Blood culture 2/19 >> negative  BAL resp cx 2/20 >> MRSA BAL fungal cx 2/20 >> candida albicans BAL AFB cx 2/20 >> no AFB on smear >>  Urine strep ag 2/21 >> neg Urine legionella ag 2/21 >> neg BAL 2/26 >> MRSA  BAL 3/2 >> few MRSA   BAL 3/8 >> rare staph aureus >>  Rare MRSA  Resolved Hospital Problem list   Elevated LFTs - likely combination of hx and congestive hepatopathy AKI Aspiration pneumonia/pneumonitis in setting of altered mental status Septic shock (off pressors as of 2/24) Cardiogenic shock  Demand ischemia Parapneumonic Effusion versus Empyema:S/p chest  tube placement 2/24, tPA 2/25, 2/26.  Chest tube removed 3/2.  Acute hypoxic respiratory failure New onset atrial flutter/SVT secondary to sepsis  Assessment & Plan:  Tracheostomy/chronic respiratory failure with hypoxemia due to necrotizing MRSA pneumonia, persistently positive cultures Plan Complete about 1 month of Ceftaroline as directed per ID Cap trach today and begin assessment for decannulation Pulse ox  Pulm hygiene   New onset HFrEF > improved on repeat echo Thoracic aortic aneurysm Some hypotension on 3/20 after changing medications to pills Plan Cont lopressor and Lisinopril    Neuropathic pain in feet> unclear etiology, ketamine related? No reports noted in literature Plan Decreasing Ketamine  Cont gabapentin   Atrial fibrillation Tele Cont metoprolol and DOAC  Acute metabolic encephalopathy/agitated delirium > improved greatly with slow ketamine wean Significant alcohol use and narcotic dependence Plan Dec ketamine to 0.32mkg today if holds ok will stop all together in 48 hrs Cont seroquel 200  mg bid  Decrease clonazepam to 1 mg tid  Cont gabapentin-->wonder if there is role to up-titrate this given his myositis related pain. Will eventually need to consult psych for addiction medicine consult Will try to contact Dr DuLalla Brotherse: recs about resuming his suboxone.   Fluid  and electrolyte imbalance: hyponatremia and hypokalemia  Plan Replace K  Am chem  Moderate protein calorie malnutrition Plan Adv diet as tolerated. Currently dysphagia 3  Macrocytic anemia Plan Trend cbc Transfuse for hgb < 7  Baseline necrotic myositis> at this point it's hard to determine if he has had any sort of recurrence of this as his physical deconditioning seems mostly in keeping with critical illness myopathy (he's surprisingly strong actually). Suspect restarting prednisone and methotrexate may do more harm than good right now given necrotizing pneumonia Wife  indicates he only took prednisone, he wasn't taking methotrexate Plan Holding both MTX and pred F/u out-pt neuro   Best practice (evaluated daily)  Diet:  Oral Pain/Anxiety/Delirium protocol (if indicated): No VAP protocol (if indicated): Not indicated DVT prophylaxis: Systemic AC GI prophylaxis: N/A Glucose control:  SSI No Central venous access:  Yes, and it is still needed Arterial line:  N/A Foley:  N/A Mobility:  OOB  PT consulted: Yes Last date of multidisciplinary goals of care discussion [3/13] goc discussed Code Status:  full code   Disposition: remain in ICU  Erick Colace ACNP-BC [place] Pager # [phone] OR # [phone] if no answer

## 2020-09-26 NOTE — TOC Progression Note (Addendum)
Transition of Care [facility]) - Progression Note    Patient Details  Name: Joseph Ayala MRN: [id number] Date of Birth: 24-Sep-1965  Transition of Care [facility]) CM/SW Contact  Leeroy Cha, RN Phone Number: 09/26/2020, 8:50 AM  Clinical Narrative:    [place] [facility] Events: Including procedures, antibiotic start and stop dates in addition to other pertinent events   2/19 Arrived to ED for cough and chest pain x 1 week 2/20 Admitted to [facility] in early am. Intubated. Bronch ETT 2/21 >> 3/9 R Radial Aline 2/20 >>3/4  L IJ TLC 2/21 >> 3/4 CTA Chest 2/19 >> right sided consolidation and ground glass opacities in the right and middle lobe. Background centrilobular emphysema present. No pulmonary embolism Ceftriaxone 2/20 >> 2/21 Azithro 2/20 >> 2/21 Vanc 2/21 >>3/4  Zosyn 2/21 >> 2/23 2/22 Worsening respiratory status and vasopressor requirement. Agitation on vent 2/24 Off pressors. Right chest tube placement. Propofol changed versed as TRG increased R CT 2/24 >> 3/2 CT CAP 2/24 >> Interval enlargement of right pleural effusion with some loculation. Progression of right airspace disease with cavitation in right middle/lower lobe. Anasarca 2/25 About 650 out of CT since insertion. Stable vent requirements. Sedation and agitation a challenge / librium added. Fever. TPA into chest tube 2/26 FOB for mucus plugging 2/28 Repeat CT with little residual pleural fluid, largely consolidated lung. On vent. Oral oxy added. CT Chest w/o 2/28 >> no large amount of residual pleural fluid present with most of the low-density abnormality in the right mid to lower chest felt to represent necrotic and consolidated RLL, RML. There is still some fluid component in the posterior pleural space superior to the tip of the indwelling chest tube  3/01 Ceiling of IV gtt's reduced, weaning on PSV 10/5 - weaned for 8h. Diuresis.  3/02 Sedation changed, hypotension, levophed initiated. FOB with BAL, chest tube  removed.  3/04 Discontinue aline, PICC placed LUE LUE PICC 3/4 >> Linezolid 3/4 >>3/8  3/06 Improved fever curve on linezolid, challenging sedation 3/07 Difficulties with sedation >changed back to versed, dilaudid. Oral methadone added.  3/08 R white out on CXR / mucus plugging. Methadone stopped, re-challenge with ketamine. FOB with BAL.  CT Chest 3/8 >>persistent area of RML, RLL airspace disease with decreasing cavitation and increasing consolidation since prior study, increased airspace disease throughout the aerated RUL, consistent with progressive PNA. Stable LLL consolidation, trace L pleural effusion  Ceftaroline 3/8 >> Trach Janace Hoard) 3/9 >> 3/09 [place]mer on ketamine at higher dose, on propofol 20 mcg, dilaudid 45m Ketamine increased. Propofol stopped during day. 3/11 Remains on ketamine, dilaudid down to 1160m/ weaning  TEE 3/11 >>EF 45-50% No veg/ L to R ASD  3/12 added clonidine to help with dilaudid wean completely off drip stopped and still needed prn for HBP 3/12 added reglan 5 mg IV q6h for emesis  Started reglan p emesis 3/13 >resolved and ready to restart tf No supplemental dilaudid overnight  3/14-3/18.added librium taper (completed 3/18). ketaminetapered slowly titrated down from 1.2 down to 0.8 ATC trials started on 3/15 and by 3/18 completely liberated from vent. SLP and PMV trials started 3/17 and MBS on 3/18. LMWH changed to [place] 3/18 for afib.Dc foley 3/18 pulled out panda 3/19 some hypotention, clonidine weaned off. 3/20 neuropathic pain ketamine reduced and neurontin added.  3/21 attempting trach capping trial. Dec Ketamine to 0.60m28mg/hr   PLan: possible extubation soon, may need snf post hospital.CIR following   Expected Discharge Plan: Home/Self Care Barriers  to Discharge: Continued Medical Work up  Expected Discharge Plan and Services Expected Discharge Plan: Home/Self Care   Discharge Planning Services: CM Consult   Living arrangements  for the past 2 months: Single Family Home                                       Social Determinants of Health (SDOH) Interventions    Readmission Risk Interventions No flowsheet data found.

## 2020-09-26 NOTE — Progress Notes (Signed)
Nutrition Follow-up  DOCUMENTATION CODES:   Not applicable  INTERVENTION:  - will order Ensure Enlive TID, each supplement provides 350 kcal and 20 grams of protein - continue to encourage intakes of meals and supplements and for patient to continue to attempt to self-feed.   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 PCCM initially consulted for evaluation for bronchoscopy for possible mucous plugging. Transferred to ICU after worsening respiratory failure and mental status requiring intubation on 2/21.  Significant Events: 2/19- admission for CAP 2/20- intubation; OGT placement 2/21- initial RD assessment 2/22- initiation of trickle TF 2/23- off pressors 2/24- transitioned from propofol to versed d/t rising TGs; chest tube placed 2/26- mucus plugging 2/28- repeat CT showed small residual pleural fluid and largely consolidated lung 3/2- chest tube removed 3/4- OGT removed 3/5- small bore NGT placed in R nare (gastric) 3/8- CXR showed white out in R lung; mucus plugging 3/9- tracheostomy 3/14- small bore NGT replaced 3/18- diet advanced from NPO to FLD 3/20- diet advanced to Dysphagia 3, thin liquids at 1240   He has been off vent via trach since 3/18. Estimated nutrition needs updated. Trach currently capped and, per discussion in rounds this AM, patient may be decannulated in the next day or two.   The only documented intake since diet advancement began was 75% of lunch on 3/19 (FLD).   Able to discuss patient with RN following rounds.  Patient sitting in the chair and wife  sitting on bed at the time of visit. He reports eating part of an omelet for breakfast this AM.   Patient remains weak d/t prolonged ICU stay and does require feeding assistance although with each meal he is able to do slightly more to work toward self feeding.  He has been experiencing early satiety and it is unclear if chewing has been laborious for him; wife reports he was initially on FLD d/t SLP noting fatigue with PO intakes.   He denies pain with chewing or swallowing and denies any abdominal pain or pressure. Patient is open to trying Ensure shakes.  Weight has been fairly stable over the past 5-6 days. Documentation in the edema section of flow sheet indicates that no edema is present.      Labs reviewed; CBGs: 105, 88, 87 mg/dl, Na: 625 mmol/l, K: 3.3 mmol/l. Medications reviewed; 100 mg colace BID, 1 mg folvite/day, 40 mg oral lasix/day, PRN imodium, 1 tablet multivitamin with minerals/day, 40 mg oral protonix/day, 60 mEq Klor-Con x1 dose 3/21, 100 mg oral thiamine/day.    Diet Order:   Diet Order            DIET DYS 3 Room service appropriate? Yes; Fluid consistency: Thin  Diet effective now                 EDUCATION NEEDS:   Education needs have been addressed  Skin:  Skin Assessment: Skin Integrity Issues: Skin Integrity Issues:: Stage II,Other (Comment) Stage I: R elbow (newly documented 3/2) Stage II: bilateral buttocks Other: skin tear to R elbow  Last BM:  3/21 (type 7 x1)  Height:   Ht Readings from Last 1 Encounters:  09/14/20  5\' 11"  (1.803 m)    Weight:   Wt Readings from Last 1 Encounters:  09/25/20 84 kg    Estimated Nutritional Needs:  Kcal:  2200-2400 kcal Protein:  115-130 grams Fluid:  >/= 2 L/day     09/27/20, MS, RD, LDN, CNSC Inpatient Clinical Dietitian RD pager # available in [facility]  After hours/weekend pager # available in [facility]

## 2020-09-26 NOTE — Progress Notes (Addendum)
Palliative Care Progress Note (Late Entry)  Joseph Ayala has made tremendous progress following ketamine infusion initiation. Last two tenths of infusion may be more challenging. He is starting to have worsening neuropathic pain in his more extremities. This was present prior to his hospitalization and has been worked up quite extensively in terms of neuromuscular disease. Small fiber neuropathy, likely related to his many years of ETOH use and worsening by metabolic changes and immobility during his critical illness. Otherwise he does not complain of pain. His affect is flat, moderately depressed but cognitively he appears to be processing the events that have occurred and he is deconditioned and weak. I dicussed his care with his wife Joseph Ayala who was at bedside.  Recommendation:  1. Wean Ketamine to off-hopefully shortly after decannulation. To facilitate this, continue scheduled Klonopin. Change Seroquel dosing to QHS dose only to improve his daytime wakefulness and consider leaving a scheduled dose to control his depression and improve sleep. His over all opioid tolerance and sensitivity will be improved if pain control is needed. Will confirm dose of Suboxone with IMTS treatment team and start that immediately upon Ketamine discontinuation (dose started 3/22) 8/2mg .  2. Neuropathic Pain- I suspect this will be largely irreversible due to permanent ETOH toxicity damage to his peripheral nerves. Ordered B12, MMA and those are normal. Started a PRN order for Tramadol which can be given with Suboxone and may have additive benefit for analgesia-may need more frequent dosing but can be effective. Would also double his dose of gabapentin or consider switching to Lyrica 100mg  which offers once daily dosing. I have also added Topical Zostrix which may help topically. He also has a possible fungal foot infection with exfoliating skin -will try topical therapy for this. For now an IV dose of rescue Morphine between 2- 4  mg can be given but may have limited impact during times when his suboxone is at peak. He would be a good candidate for trail of IV Lidocaine (1 mg/kg given over 15 -30 min) which can be helpful in small fiber and central peripheral neuropathies. Will also place a topical Lidoderm patch on his bilateral lower extremities.  3. Provided support to patient and his wife-will continue to follow along and assist with pain and symptom management as needed.  , DO Palliative Medicine  Time: 35 min Greater than 50%  of this time was spent counseling and coordinating care related to the above assessment and plan.

## 2020-09-27 DIAGNOSIS — J850 Gangrene and necrosis of lung: Secondary | ICD-10-CM | POA: Diagnosis not present

## 2020-09-27 LAB — CBC
HCT: 31.6 % — ABNORMAL LOW (ref 39.0–52.0)
Hemoglobin: 10.2 g/dL — ABNORMAL LOW (ref 13.0–17.0)
MCH: 30.2 pg (ref 26.0–34.0)
MCHC: 32.3 g/dL (ref 30.0–36.0)
MCV: 93.5 fL (ref 80.0–100.0)
Platelets: 361 10*3/uL (ref 150–400)
RBC: 3.38 MIL/uL — ABNORMAL LOW (ref 4.22–5.81)
RDW: 16.4 % — ABNORMAL HIGH (ref 11.5–15.5)
WBC: 10.4 10*3/uL (ref 4.0–10.5)
nRBC: 0 % (ref 0.0–0.2)

## 2020-09-27 LAB — BASIC METABOLIC PANEL
Anion gap: 9 (ref 5–15)
BUN: 13 mg/dL (ref 6–20)
CO2: 24 mmol/L (ref 22–32)
Calcium: 9.6 mg/dL (ref 8.9–10.3)
Chloride: 101 mmol/L (ref 98–111)
Creatinine, Ser: 0.65 mg/dL (ref 0.61–1.24)
GFR, Estimated: >60 mL/min (ref >60)
Glucose, Bld: 109 mg/dL — ABNORMAL HIGH (ref 70–99)
Potassium: 3.7 mmol/L (ref 3.5–5.1)
Sodium: 134 mmol/L — ABNORMAL LOW (ref 135–145)

## 2020-09-27 LAB — MAGNESIUM: Magnesium: 2.1 mg/dL (ref 1.7–2.4)

## 2020-09-27 MED ORDER — MORPHINE SULFATE (PF) 4 MG/ML IV SOLN: 4.0000 mg | INTRAVENOUS | Status: DC | PRN

## 2020-09-27 MED ORDER — MORPHINE SULFATE (PF) 2 MG/ML IV SOLN
2.0000 mg | Freq: Once | INTRAVENOUS | Status: AC | PRN
  Administered 2020-09-27: 2 mg via INTRAVENOUS
  Filled 2020-09-27: qty 1

## 2020-09-27 MED ORDER — TRAMADOL HCL 50 MG PO TABS
100.0000 mg | ORAL_TABLET | Freq: Four times a day (QID) | ORAL | Status: DC | PRN
Start: 2020-09-27 — End: 2020-09-29
  Administered 2020-09-28 (×2): 100 mg via ORAL
  Filled 2020-09-27 (×2): qty 2

## 2020-09-27 MED ORDER — BUPRENORPHINE HCL-NALOXONE HCL 8-2 MG SL SUBL
1.0000 | SUBLINGUAL_TABLET | Freq: Every day | SUBLINGUAL | Status: DC
Start: 2020-09-27 — End: 2020-09-28
  Administered 2020-09-27: 1 via SUBLINGUAL
  Filled 2020-09-27: qty 1

## 2020-09-27 NOTE — Progress Notes (Signed)
°  Speech Language Pathology Treatment: Dysphagia  Patient Details Name: Maksym Pfiffner Grealish MRN: [id number] DOB: 1966/02/24 Today's Date: 09/27/2020 Time: 6606-3016 SLP Time Calculation (min) (ACUTE ONLY): 15 min  Assessment / Plan / Recommendation Clinical Impression  Pt observed consuming his meal tray including brocolli, pot roast, bites of cake and soda.  He was decannulated this morning - has foam barrier over stoma with minimal air leakage while speaking.  RN cued pt to cover stoma to improve phonation.    He is using buildup foam with utensils for self feeding.  No indication of aspiration during po noted.  He did however take a large meat bolus - requiring cue to take small bolus and masticate well. Despite very large bolus, pt did not complain of pharyngeal retention.  Reviewed importance of taking small boluses with pt  - to which he advised "so it won't go down the wrong way".  Intake documented in the flow-sheets appears to be good and pt stated "I like cafeteria food, It's good."    Given during MBS on 3/18, pt had small bore feeding tube in place and is now decannulated- suspect his swallowing function has continued to improve.  Reviewed MBS with patient to demonstrate potential impact of small bore feeding tube.    Using teach back and written recommendations for precautions including starting intake with liquids for oral moisture, taking small bites/sips and masticating well and keeping cough/"expectoration" strong for airway protection, pt educated. Pt verbalized reasoning and demonstrated precaution x1 during session.  Mr Lavey is continuing to make remarkable progress.   With questioning, pt admits to having some difficulty/delays with communication since being on a vent.   Recommend cognitive linguistic evaluation on CIR to help maxmize pt's rehab potential and decrease caregiver burden.    HPI HPI: 55 year old male on Suboxone for necrotizing myopathy admitted 2/19 with acute  hypoxemic respiratory failure secondary to MRSA community-acquired pneumonia / aspiration. PCCM initially consulted for evaluation for bronchoscopy for possible mucous plugging. Transferred to ICU after worsening respiratory failure and mental status requiring intubation on 2/21. ICU course complicated by hypotension requiring pressors, chest tube placement for empyema s/p intrapleural lytics. Trach 3/9. 3/17 attempting ATC 24/7. Holding ketamine at 1 trach collar trials during the day, vent at night.  Pt was intubated 17 days, trached on 3/9 and now on trach collar trials 24/7.  Per RN, he fatigues easily. He desires po intake.  Small right  pleural effusion with right base atelectasis. Lungs elsewhere clear. per most recent CXR. PMSV and swallow evaluation ordered.      SLP Plan  Continue with current plan of care       Recommendations  Diet recommendations: Dysphagia 3 (mechanical soft);Thin liquid Liquids provided via: Cup;Straw Medication Administration: Whole meds with liquid Supervision: Intermittent supervision to cue for compensatory strategies Compensations: Minimize environmental distractions;Slow rate;Small sips/bites;Other (Comment) (start intake with liquids) Postural Changes and/or Swallow Maneuvers: Seated upright 90 degrees;Upright 30-60 min after meal                Oral Care Recommendations: Oral care BID Follow up Recommendations: Inpatient Rehab Plan: Continue with current plan of care       GO                Chales Abrahams 09/27/2020, 4:45 PM  Rolena Infante, MS [facility] SLP Acute Rehab Services Office [phone] Pager [phone]

## 2020-09-27 NOTE — Progress Notes (Signed)
Physical Therapy Treatment Patient Details Name: Joseph Ayala MRN: [id number] DOB: 25-Apr-1966 Today's Date: 09/27/2020    History of Present Illness Patient is a 55 y.o. male s/p tracheotomy on 09/14/2020 who presented to the ED with complaints of chest pain, cough, and SOB admitted for acute hypoxemic respiratory failure secondary to MRSA community-acquired pneumonia / aspiration with PMH significant for aortic aneurysm, HTN,  ETOH abuse, and opioid use.    PT Comments    Pt decannulated today and on RA upon entrance, O2 sats remained 94-100% throughout session with HR max of 101bpm. Pt demonstrated improved ability to power up to stand requiring MIN assist for safety/stability and cues for safe hand placement. Pt progressing toward acute PT goals with increased ambulation distance of 176ft requiring multiple seated rest breaks and MIN assist for safety/stability demonstrating staggering/drifting Lt/Rt and intermittent scissoring steps. Pt continues to require repeated cues for safety and demonstrates decreased awareness of deficits. Pt remains highly motivated and participatory and tolerated increased duration of session well. Pt will have 24/7 supervision from his wife at home. Continue to recommend CIR upon discharge to maximize functional mobility and return to PLOF.  Acute therapy to follow up during stay to progress functional mobility as able.       Follow Up Recommendations  CIR     Equipment Recommendations  None recommended by PT    Recommendations for Other Services Rehab consult;Speech consult     Precautions / Restrictions Precautions Precautions: Fall Restrictions Weight Bearing Restrictions: No    Mobility  Bed Mobility               General bed mobility comments: pt OOB in recliner    Transfers Overall transfer level: Needs assistance Equipment used: Rolling walker (2 wheeled) Transfers: Sit to/from Stand Sit to Stand: Min assist         General  transfer comment: Pt demonstrated improved ability to power up from stand requiring MIN assist for safety/stabilty with cues for safe hand placement. min assist from recliner and low wheelchair surface.  Ambulation/Gait Ambulation/Gait assistance: Min assist;Mod assist Gait Distance (Feet): 110 Feet (45, 55, 10) Assistive device: Rolling walker (2 wheeled) Gait Pattern/deviations: Step-to pattern;Decreased step length - right;Decreased step length - left;Shuffle;Staggering right;Staggering left;Drifts right/left;Scissoring Gait velocity: decr   General Gait Details: MIN-MOD assist for safety/stability with cues for RW management and to maintain safe proximity to RW. Pt demonstrated staggering/drifting Lt/Rt with increased severity when fatigued requiring multiple seated rest breaks. Pt with occasional scissoring steps with turns and assist needed to steady. Pt's HR max 101 bpm and SpO2 lowest of 94% during mobility. (ambulated in hallway with wheelchair follow and rolled outside to hospital garden after first two bouts of gait training. Ambulated thrid bout once back in room.)   Stairs             Wheelchair Mobility    Modified Rankin (Stroke Patients Only)       Balance Overall balance assessment: Needs assistance Sitting-balance support: Feet supported Sitting balance-Leahy Scale: Fair     Standing balance support: Bilateral upper extremity supported;During functional activity Standing balance-Leahy Scale: Poor Standing balance comment: pt reliant on use of RW to maintain standing balance. Pt was able to stand for about ~40min and performed standing marches with B UE support. cues needed for safety to maintain UE support as pt unsteady and remains HIGH fall risk without.  Cognition Arousal/Alertness: Awake/alert Behavior During Therapy: Impulsive;Agitated Overall Cognitive Status: Impaired/Different from baseline Area of Impairment:  Memory;Safety/judgement                     Memory: Decreased short-term memory   Safety/Judgement: Decreased awareness of safety;Decreased awareness of deficits     General Comments: pt impulsive with sit to stand x1 from wheelchair requiring cues for proper and safe positiong and use of RW. pt mentions multiple times he feels ready to go home and get back to normal with decresaed awareness of current deficits and safety concerns. pt also tangental at times transitioning to converstaions/topics that aren't relavant to current task or situation. Pt talking about his truck and money in the truck and his firearms at home.      Exercises General Exercises - Lower Extremity Hip Flexion/Marching: AROM;Both;Standing;5 reps    General Comments General comments (skin integrity, edema, etc.): pt decannulated today O2 sats remained 94-100% throughout session on RA. BP following ambulation with standing 82/50 mmHg, potentially poor reading as pt was using arm utiilzed to take BP. BP found to be 117/69 after ~2-3 min while seated.      Pertinent Vitals/Pain Pain Assessment: Faces Faces Pain Scale: Hurts little more Pain Location: B Heels Pain Descriptors / Indicators: Discomfort;Sore Pain Intervention(s): Limited activity within patient's tolerance    Home Living                      Prior Function            PT Goals (current goals can now be found in the care plan section) Acute Rehab PT Goals Patient Stated Goal: get stronger PT Goal Formulation: With patient Time For Goal Achievement: 09/28/20 Potential to Achieve Goals: Good Progress towards PT goals: Progressing toward goals (progressing well and highly motivated!)    Frequency    Min 4X/week      PT Plan Current plan remains appropriate    Co-evaluation              AM-PAC PT "6 Clicks" Mobility   Outcome Measure  Help needed turning from your back to your side while in a flat bed without using  bedrails?: A Little Help needed moving from lying on your back to sitting on the side of a flat bed without using bedrails?: A Little Help needed moving to and from a bed to a chair (including a wheelchair)?: A Little Help needed standing up from a chair using your arms (e.g., wheelchair or bedside chair)?: A Little Help needed to walk in hospital room?: A Lot Help needed climbing 3-5 steps with a railing? : Total 6 Click Score: 15    End of Session Equipment Utilized During Treatment: Gait belt Activity Tolerance: Patient tolerated treatment well Patient left: in chair;with call bell/phone within reach;with family/visitor present Nurse Communication: Mobility status (nurse present throughout session) PT Visit Diagnosis: Muscle weakness (generalized) (M62.81);Difficulty in walking, not elsewhere classified (R26.2)     Time: 1140-1235 (10 minutes not billable, rolled in wheelchair) PT Time Calculation (min) (ACUTE ONLY): 55 min  Charges:  $Gait Training: 23-37 mins $Therapeutic Activity: 8-22 mins                     Kosisochukwu Burningham, SPT  Acute rehab     Renaud Celli 09/27/2020, 5:28 PM

## 2020-09-27 NOTE — Progress Notes (Signed)
RT NOTE:  Pt decannulated by CCM NP. Pt on room air with saturations of 97%. Pt states he feels good at this time, vitals are stable, RT will continue to monitor.

## 2020-09-27 NOTE — Progress Notes (Signed)
Inpatient Rehab Admissions Coordinator:   Consult received. Spoke to pt's wife over the phone to discuss goals and expectations of CIR stay.  We discussed average length of stay to be about 2 weeks and wife confirms she can provide 24/7 supervision at discharge if needed.  I will start insurance authorization process today.  Estill Dooms, PT, DPT Admissions Coordinator [phone] 09/27/20  2:38 PM

## 2020-09-27 NOTE — Progress Notes (Signed)
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 PCCM initially consulted for evaluation for bronchoscopy for possible mucous plugging. Transferred to ICU after worsening respiratory failure and mental status requiring intubation on 2/21. ICU course complicated by hypotension requiring pressors, chest tube placement for empyema s/p intrapleural lytics. Trach 3/9.   Hx of poorly explained myopathy, self medicating, pulse steroids / methotrexate.  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 Intubated. Bronch ETT 2/21 >> 3/9 R Radial Aline 2/20 >> 3/4  L IJ TLC 2/21 >> 3/4 CTA Chest 2/19 >> right sided consolidation and ground glass opacities in the right and middle lobe. Background centrilobular emphysema present. No pulmonary embolism Ceftriaxone 2/20 >> 2/21 Azithro 2/20 >> 2/21 Vanc 2/21 >> 3/4  Zosyn 2/21 >> 2/23 2/22 Worsening respiratory status and vasopressor requirement. Agitation on vent 2/24 Off pressors. Right chest tube placement. Propofol changed versed as TRG increased R CT 2/24 >> 3/2 CT CAP 2/24 >> Interval enlargement of right pleural  effusion with some loculation. Progression of right airspace disease with cavitation in right middle/lower lobe. Anasarca 2/25 About 650 out of CT since insertion. Stable vent requirements. Sedation and agitation a challenge / librium added. Fever. TPA into chest tube 2/26 FOB for mucus plugging 2/28 Repeat CT with little residual pleural fluid, largely consolidated lung. On vent. Oral oxy added. CT Chest w/o 2/28 >> no large amount of residual pleural fluid present with most of the low-density abnormality in the right mid to lower chest felt to represent necrotic and consolidated RLL, RML. There is still some fluid component in the posterior pleural space superior to the tip of the indwelling chest tube  3/01 Ceiling of IV gtt's reduced, weaning on PSV 10/5 - weaned for 8h. Diuresis.   3/02 Sedation changed, hypotension, levophed initiated. FOB with BAL, chest tube removed.   3/04 Discontinue aline, PICC placed LUE LUE PICC 3/4 >>  Linezolid 3/4 >> 3/8  3/06 Improved fever curve on linezolid, challenging sedation 3/07 Difficulties with sedation > changed back to versed, dilaudid. Oral methadone added.  3/08 R white out on CXR / mucus plugging. Methadone stopped, re-challenge with ketamine. FOB with BAL.  CT Chest 3/8 >> persistent area of RML, RLL airspace disease with decreasing cavitation and increasing consolidation since prior study, increased airspace disease throughout the aerated RUL, consistent with progressive PNA. Stable LLL consolidation, trace L pleural effusion  Ceftaroline 3/8 >>  Trach Janace Hoard) 3/9 >>  3/09 Calmer on ketamine at higher dose, on propofol 20 mcg, dilaudid 44m Ketamine increased. Propofol stopped during day. 3/11 Remains on ketamine, dilaudid down to 164m/ weaning  TEE 3/11 >> EF 45-50%  No veg/ L to R ASD  3/12 added clonidine  to help with dilaudid wean completely off  drip stopped and still needed prn for HBP 3/12 added reglan 5 mg IV q6h  for emesis  Started  reglan p emesis 3/13 > resolved and ready to restart tf No supplemental dilaudid overnight  3/14-3/18.  added librium taper (completed 3/18). ketamine tapered slowly titrated down from 1.2 down to 0.8 ATC trials started on 3/15 and by 3/18 completely liberated from vent. SLP and PMV trials started 3/17 and MBS on 3/18. LMWH changed to [place] on 3/18 for afib. Dc foley  3/18 pulled out panda 3/19 some hypotention, clonidine weaned off. 3/20 neuropathic pain ketamine reduced and neurontin added.  3/21 attempting trach capping trial. Dec Ketamine to 0.50m/kg/hr. lopressor dose decreased d/t low BP 3/22 Trach removed/decannulated. Added back Suboxone 863mSL after consulting his Primary pain MD Dr ViEvette DoffingInterim History / Subjective:   C/o pins & needle leg pain still otherwise doing well. Tolerated capping trials. I decannulated him.    Objective   Blood pressure (Abnormal) 114/92, pulse 86, temperature 98.6 F (37 C), temperature source Oral, resp. rate 16, height _0  (1.803 m), weight 84 kg, SpO2 95 %.        Intake/Output Summary (Last 24 hours) at 09/27/2020 0929 Last data filed at 09/27/2020 0800 Gross per 24 hour  Intake 946.23 ml  Output 3700 ml  Net -2753.77 ml   Filed Weights   09/23/20 0500 09/24/20 0412 09/25/20 0500  Weight: 80.7 kg 82.7 kg 84 kg    Examination: General resting in bed. Conversant no distress HENT NCAT excellent phonation w/ trach capped. Now has occlusive dressing needs finger splint to talk but phonation excellent pulm clear and on room air Card rrr abd soft  Ext warm  Neuro intact  gu cl yellow condom cath    Labs/imaging personally reviewed   See below Currently no pending   Resolved Hospital Problem list   Elevated LFTs - likely combination of hx and congestive hepatopathy AKI Aspiration pneumonia/pneumonitis in setting of altered mental status Septic shock (off pressors as of 2/24) Cardiogenic shock  Demand ischemia Parapneumonic  Effusion versus Empyema:S/p chest tube placement 2/24, tPA 2/25, 2/26.  Chest tube removed 3/2.  Acute hypoxic respiratory failure New onset atrial flutter/SVT secondary to sepsis Trach dependence (decannualted 3/22)  Assessment & Plan:  Tracheostomy/chronic respiratory failure with hypoxemia due to necrotizing MRSA pneumonia, persistently positive cultures Decannulated 3/22 Plan Keep occlusive dressing in place.  Splint trach stoma site for cough and communication Pulse ox  pulm hygiene  Currently on Ceftaroline as directed by ID last note on 3/14 indicated 14 d total IV (which is today) then transition to Orals w/ stop date TBD (will defer to ID)   New onset HFrEF > improved on repeat echo Thoracic aortic aneurysm Some hypotension on 3/20 after changing medications to pills Plan Cont lopressor and lisinopril    Neuropathic pain in feet> unclear etiology, ketamine related? No reports noted in literature Plan Cont gabapentin  Cont to wean ketamine   Atrial fibrillation Plan Lopressor DOAC Tele   Acute metabolic encephalopathy/agitated delirium > improved greatly with slow ketamine wean Significant alcohol use and narcotic dependence Plan Cont ketamine at 0.3 mg/kg today & Start Suboxone at 6m16mL daily; stop ketamine 3/23 Cont clonazepam 1mg53md (can prob change to bid in next 48hrs) Cont gabapentin  seroquel 200 mg bid   Fluid and electrolyte imbalance: hyponatremia  Plan trend  Moderate protein calorie malnutrition Plan Adv as  tolerated dysphagia 3   Macrocytic anemia Plan Trend cbc Transfuse for hgb < 7  Baseline necrotic myositis> at this point it's hard to determine if he has had any sort of recurrence of this as his physical deconditioning seems mostly in keeping with critical illness myopathy (he's surprisingly strong actually). Suspect restarting prednisone and methotrexate may do more harm than good right now given necrotizing pneumonia Wife indicates  he only took prednisone, he wasn't taking methotrexate Plan Holding MTX and pred F/u out-pt    Best practice (evaluated daily)  Diet:  Oral Pain/Anxiety/Delirium protocol (if indicated): No VAP protocol (if indicated): Not indicated DVT prophylaxis: Systemic AC GI prophylaxis: N/A Glucose control:  SSI No Central venous access:  Yes, and it is still needed Arterial line:  N/A Foley:  N/A Mobility:  OOB  PT consulted: Yes Last date of multidisciplinary goals of care discussion [3/13] goc discussed Code Status:  full code   Disposition: remain in ICU  Erick Colace ACNP-BC [place] Pager # [phone] OR # [phone] if no answer

## 2020-09-27 NOTE — Progress Notes (Signed)
eLink Physician-Brief Progress Note Patient Name: Joseph Ayala DOB: 1966/03/31 MRN: [id number]   Date of Service  09/27/2020  HPI/Events of Note  Patient complaining of left foot pain despite gabapentin, tramadol, acetaminophen, ibuprofen and clonazepam. Titrating down ketamine. Confirmed presence of pulse on affected foot.  eICU Interventions  Will give a one time dose of morphine 2mg      Intervention Category Intermediate Interventions: Pain - evaluation and management  09/27/2020, 4:43 AM

## 2020-09-28 ENCOUNTER — Inpatient Hospital Stay (HOSPITAL_COMMUNITY): Payer: Medicare HMO

## 2020-09-28 DIAGNOSIS — G729 Myopathy, unspecified: Secondary | ICD-10-CM

## 2020-09-28 DIAGNOSIS — G9341 Metabolic encephalopathy: Secondary | ICD-10-CM | POA: Diagnosis not present

## 2020-09-28 DIAGNOSIS — J850 Gangrene and necrosis of lung: Secondary | ICD-10-CM | POA: Diagnosis not present

## 2020-09-28 DIAGNOSIS — G894 Chronic pain syndrome: Secondary | ICD-10-CM | POA: Diagnosis not present

## 2020-09-28 DIAGNOSIS — J15212 Pneumonia due to Methicillin resistant Staphylococcus aureus: Secondary | ICD-10-CM | POA: Diagnosis not present

## 2020-09-28 DIAGNOSIS — G629 Polyneuropathy, unspecified: Secondary | ICD-10-CM

## 2020-09-28 DIAGNOSIS — I5020 Unspecified systolic (congestive) heart failure: Secondary | ICD-10-CM

## 2020-09-28 DIAGNOSIS — I480 Paroxysmal atrial fibrillation: Secondary | ICD-10-CM | POA: Diagnosis not present

## 2020-09-28 LAB — BASIC METABOLIC PANEL
Anion gap: 8 (ref 5–15)
BUN: 16 mg/dL (ref 6–20)
CO2: 27 mmol/L (ref 22–32)
Calcium: 10 mg/dL (ref 8.9–10.3)
Chloride: 102 mmol/L (ref 98–111)
Creatinine, Ser: 0.64 mg/dL (ref 0.61–1.24)
GFR, Estimated: >60 mL/min (ref >60)
Glucose, Bld: 94 mg/dL (ref 70–99)
Potassium: 4.3 mmol/L (ref 3.5–5.1)
Sodium: 137 mmol/L (ref 135–145)

## 2020-09-28 LAB — CBC
HCT: 32.4 % — ABNORMAL LOW (ref 39.0–52.0)
Hemoglobin: 10.5 g/dL — ABNORMAL LOW (ref 13.0–17.0)
MCH: 30.3 pg (ref 26.0–34.0)
MCHC: 32.4 g/dL (ref 30.0–36.0)
MCV: 93.4 fL (ref 80.0–100.0)
Platelets: 292 10*3/uL (ref 150–400)
RBC: 3.47 MIL/uL — ABNORMAL LOW (ref 4.22–5.81)
RDW: 16.4 % — ABNORMAL HIGH (ref 11.5–15.5)
WBC: 9.8 10*3/uL (ref 4.0–10.5)
nRBC: 0 % (ref 0.0–0.2)

## 2020-09-28 LAB — METHYLMALONIC ACID, SERUM: Methylmalonic Acid, Quantitative: 224 nmol/L (ref 0–378)

## 2020-09-28 IMAGING — MR MR LUMBAR SPINE WO/W CM
5 of 7 series · 31 of 48 positions shown · IV contrast (gadavist)
Comparison: None.

CLINICAL DATA: Low back pain, lower extremity neuropathy

EXAM:
MRI LUMBAR SPINE WITHOUT AND WITH CONTRAST
TECHNIQUE: Multiplanar and multiecho pulse sequences of the lumbar spine were
obtained without and with intravenous contrast.
CONTRAST:  9mL GADAVIST GADOBUTROL 1 MMOL/ML IV SOLN

[Series 5: T2 · sagittal · 4.0mm · 0.81mm/px · 3 of 16 slices shown (1 of 2)]
[im 1/16]
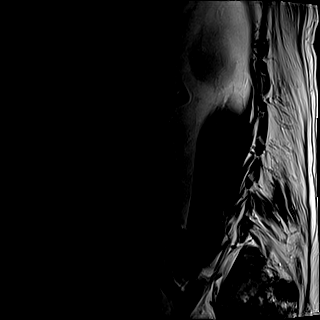
[im 8/16]
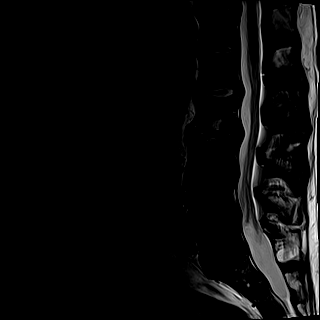
[im 16/16]
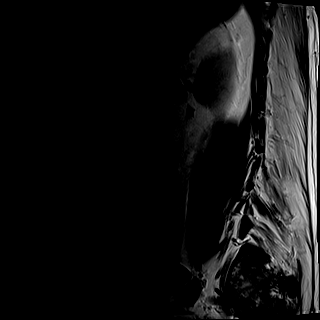

[Series 6: T1 · sagittal · 4.0mm · 0.81mm/px · 4 of 16 slices shown (1 of 2)]
[im 1/16]
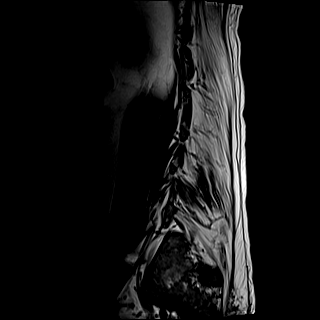
[im 6/16]
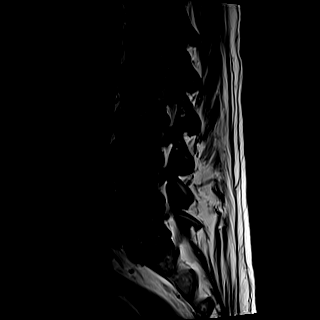
[im 11/16]
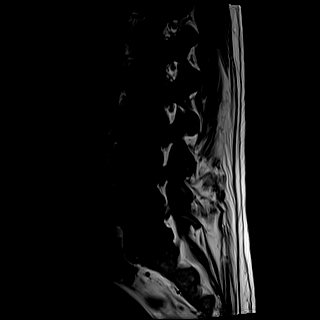
[im 16/16]
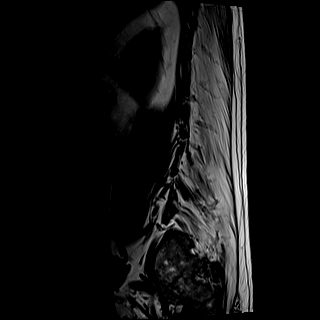

[Series 8: T1 · axial · 4.0mm · 0.39mm/px · z∈[-19,+204]mm · 11 of 43 slices shown (2 of 2)]
[im 1/43]
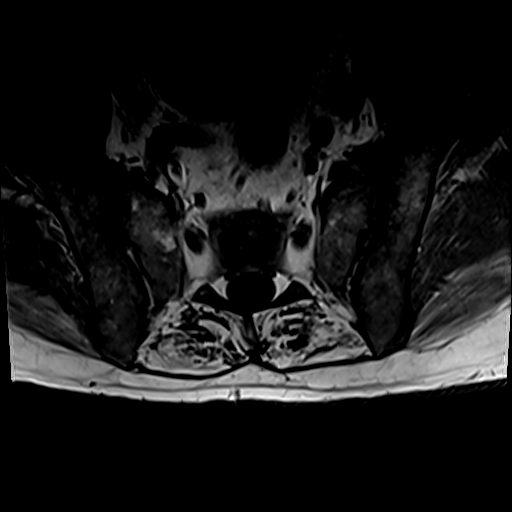
[im 5/43]
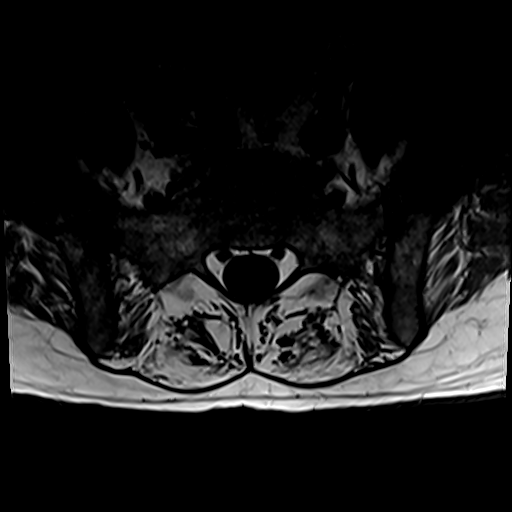
[im 9/43]
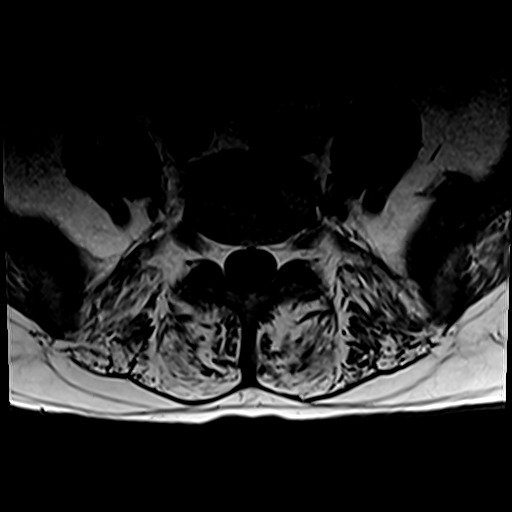
[im 13/43]
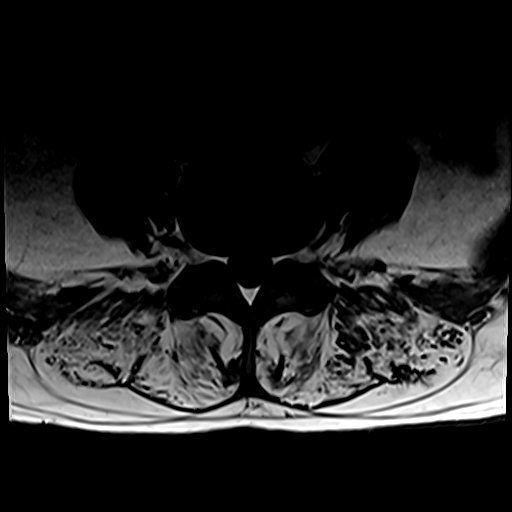
[im 17/43]
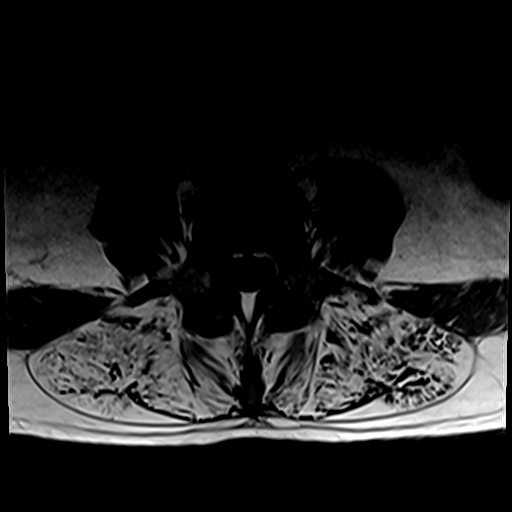
[im 22/43]
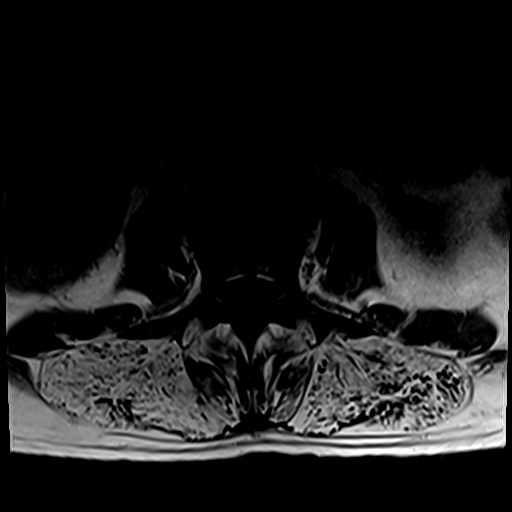
[im 26/43]
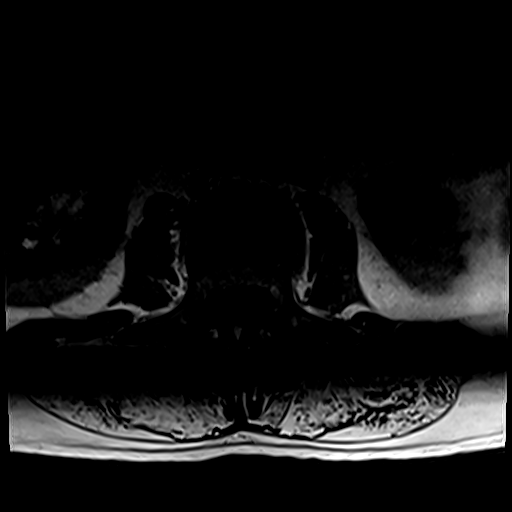
[im 30/43]
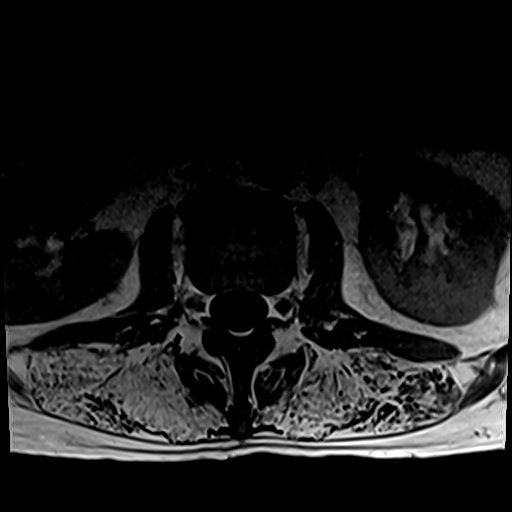
[im 34/43]
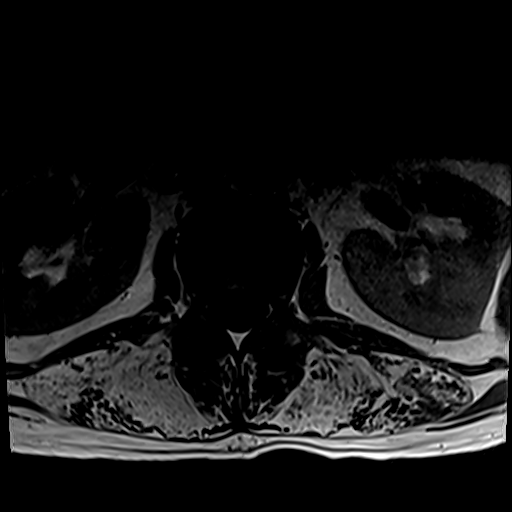
[im 38/43]
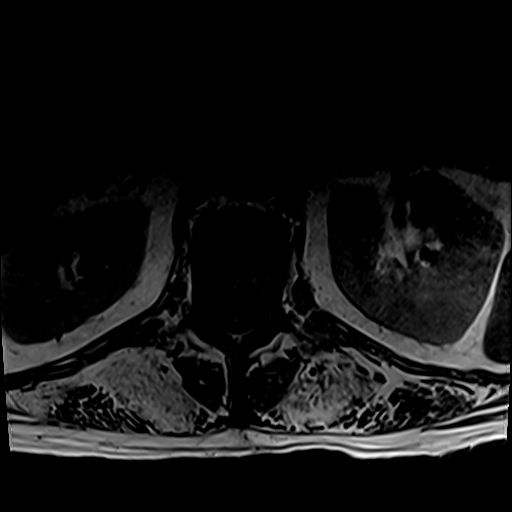
[im 43/43]
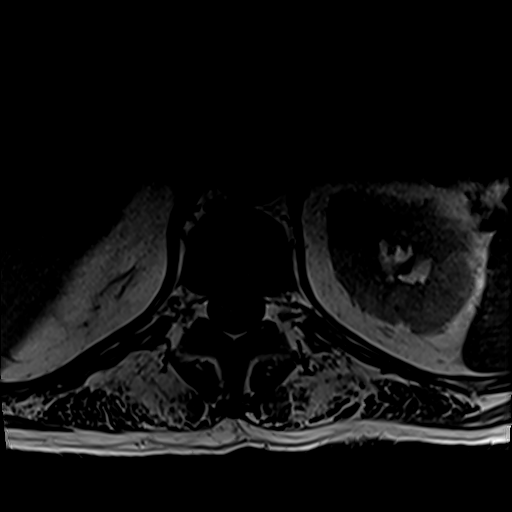

[Series 9: T2 · axial · 4.0mm · 0.62mm/px · z∈[-19,+204]mm · 11 of 43 slices shown (2 of 2)]
[im 1/43]
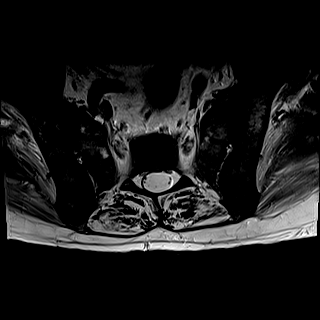
[im 5/43]
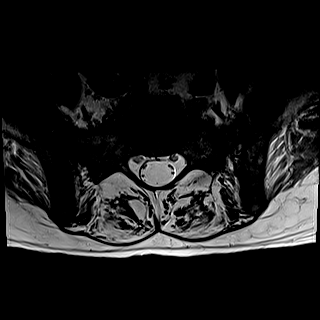
[im 9/43]
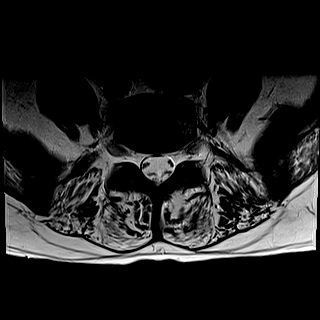
[im 13/43]
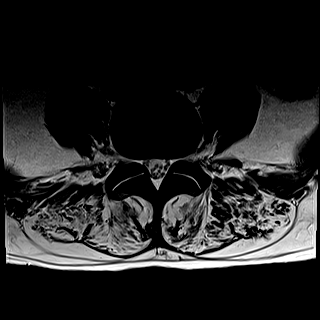
[im 17/43]
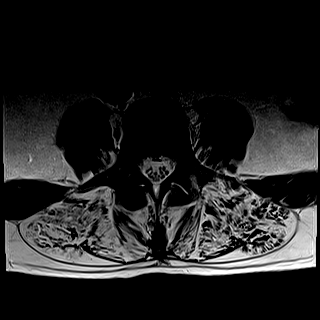
[im 22/43]
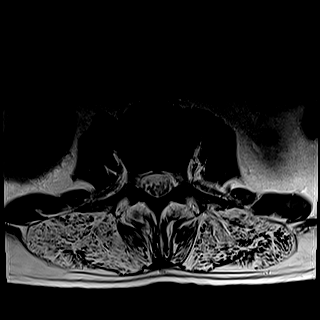
[im 26/43]
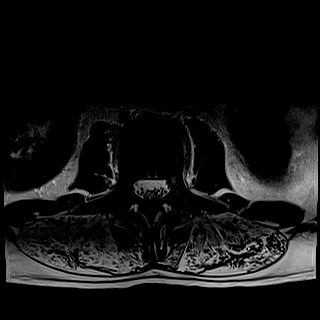
[im 30/43]
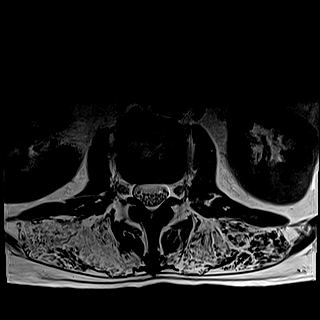
[im 34/43]
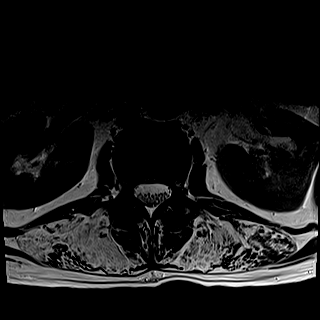
[im 38/43]
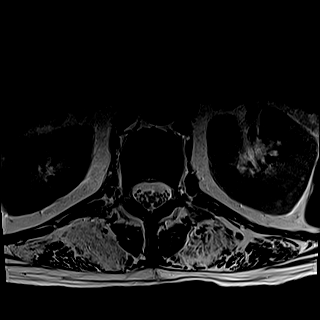
[im 43/43]
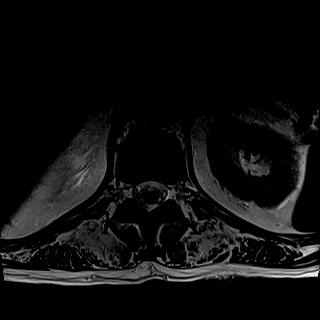

[Series 10: T1 fat-sat post-contrast · sagittal · 4.0mm · 0.81mm/px · 2 of 16 slices shown]
[im 1/16]
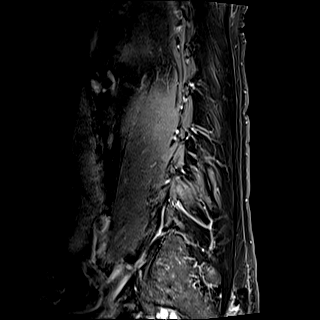
[im 6/16]
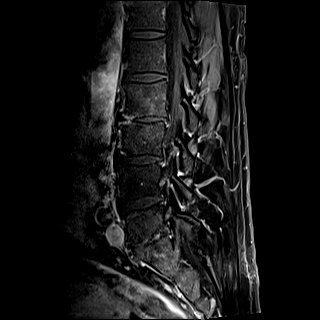

[31 of 48 positions shown; findings below may reference images not displayed]

FINDINGS: Segmentation:  Standard.

Alignment:  No significant listhesis.

Vertebrae: Mild degenerative endplate irregularity. Mild
degenerative endplate marrow edema at L2-L3. Decreased T1 marrow
signal may reflect anemia.

Conus medullaris and cauda equina: Conus extends to the L1 level.
Conus and cauda equina appear normal. No abnormal intrathecal
enhancement.

Paraspinal and other soft tissues: Posterior paraspinal muscle fatty
atrophy. Otherwise unremarkable.

Disc levels: Multilevel disc space narrowing, greatest at L5-S1.

L1-L2: Shallow right paracentral disc protrusion. No canal or
foraminal stenosis.

L2-L3:  Mild disc bulge.  No canal or foraminal stenosis.

L3-L4:  Minor disc bulge.  No canal or foraminal stenosis.

L4-L5:  Minor disc bulge.  No canal or foraminal stenosis.

L5-S1: Mild disc bulge with endplate osteophytic ridging. No canal
or foraminal stenosis.
IMPRESSION: Mild degenerative changes without significant stenosis.

## 2020-09-28 MED ORDER — QUETIAPINE FUMARATE 50 MG PO TABS
50.0000 mg | ORAL_TABLET | Freq: Every day | ORAL | Status: DC
  Administered 2020-09-29: 50 mg via ORAL
  Filled 2020-09-28: qty 1

## 2020-09-28 MED ORDER — LIDOCAINE 5 % EX PTCH
2.0000 | MEDICATED_PATCH | CUTANEOUS | Status: DC
  Administered 2020-09-28: 2 via TRANSDERMAL
  Filled 2020-09-28 (×3): qty 2

## 2020-09-28 MED ORDER — LINEZOLID 600 MG PO TABS
600.0000 mg | ORAL_TABLET | Freq: Two times a day (BID) | ORAL | Status: DC
  Administered 2020-09-28 – 2020-09-29 (×2): 600 mg via ORAL
  Filled 2020-09-28 (×2): qty 1

## 2020-09-28 MED ORDER — QUETIAPINE FUMARATE 100 MG PO TABS: 200.0000 mg | ORAL_TABLET | Freq: Every day | ORAL | Status: DC

## 2020-09-28 MED ORDER — QUETIAPINE FUMARATE 100 MG PO TABS
100.0000 mg | ORAL_TABLET | Freq: Every day | ORAL | Status: DC
  Administered 2020-09-28: 100 mg via ORAL
  Filled 2020-09-28: qty 1

## 2020-09-28 MED ORDER — BUPRENORPHINE HCL-NALOXONE HCL 8-2 MG SL SUBL
1.0000 | SUBLINGUAL_TABLET | Freq: Two times a day (BID) | SUBLINGUAL | Status: DC
Start: 2020-09-28 — End: 2020-09-29
  Administered 2020-09-28 – 2020-09-29 (×3): 1 via SUBLINGUAL
  Filled 2020-09-28 (×3): qty 1

## 2020-09-28 MED ORDER — GADOBUTROL 1 MMOL/ML IV SOLN
9.0000 mL | Freq: Once | INTRAVENOUS | Status: AC | PRN
  Administered 2020-09-28: 9 mL via INTRAVENOUS

## 2020-09-28 MED ORDER — CLONAZEPAM 1 MG PO TABS
1.0000 mg | ORAL_TABLET | Freq: Two times a day (BID) | ORAL | Status: DC
  Administered 2020-09-28 – 2020-09-29 (×2): 1 mg via ORAL
  Filled 2020-09-28 (×2): qty 1

## 2020-09-28 NOTE — Progress Notes (Signed)
Inpatient Rehab Admissions Coordinator:   Awaiting determination from insurance for CIR auth.  Confirmed that they do have clinical information needed to make decision.   Estill Dooms, PT, DPT Admissions Coordinator [phone] 09/28/20  11:58 AM

## 2020-09-28 NOTE — H&P (Shared)
Physical Medicine and Rehabilitation Admission H&P    Chief Complaint  Patient presents with  . Debility    HPI: Joseph Ayala. Joseph Ayala is a 55 year old male with history of DDD, ETOH/substance abuse, necrotizing myopathy treated with steroids and recent addition of MTX, Chronic pain- on suboxone;  who was admitted to [facility] on 08/28/20 with one week history of cough and chest pain due to necrotizing MRSA PNA. CT chest also showed incidental 4.6 cm thoracic aneurysm (semi-annual follow up recommended) and negative for PE. He required pressors with vent support, chest tube to manage large right pleural effusion and developed progressive airspace disease with cavitation of RLL/RML, fluid overload bouts of agitation requiring addition of ketamine to dilaudid/propofol as well as librium taper.  BLE dopplers negative for DVT. CT chest   Dr. Bjorn Pippin was consulted for input on A fib with elevated trops and global hypokinesis with EF 35-40%. He felt that patient with demand ischemia and CM would be stress v/s ETOH related. PAF converted to NSR on amiodarone with recommendations to transition to oral AC when able. He required tracheostomy by Dr. Jearld Fenton was weaned to ATC, tolerated capping and was decannulation by 03/22.  He did developed increase in air space disease with persistent fever and  progressive PNA fel to be due to "high burden of disease" per Dr. Luciana Axe and antibiotics changed to Ceftaroline on 03/08 -->Linezolid today X 2 weeks per ID (can switch to doxycycline 100 mg bid if unable to tolerate latter)   He is tolerating D3, thins with aspiration precautions. Palliative care has been following for input on pain and symptom management--ketamine was weaned off as patient with reports of  neuropathy BLE and Suboxone resumed with addition of tramadol and lidocaine patches for pain management. MRI lumbar spine done for work up and showed mild degenerative changes without stenosis.  Therapy ongoing and patient  noted to be limited by balance deficits with decreased posture as well as tachycardia and hypoxia--sats down to mid 80's with activity. CIR recommended due to functional decline.    Review of Systems  Constitutional: Negative for chills and fever.  HENT: Negative for hearing loss and tinnitus.   Eyes: Negative for blurred vision and double vision.  Respiratory: Negative for cough and shortness of breath.   Cardiovascular: Negative for chest pain and palpitations.  Gastrointestinal: Negative for abdominal pain, constipation, heartburn and nausea.  Genitourinary: Negative for dysuria and urgency.  Musculoskeletal: Positive for back pain, myalgias and neck pain.  Skin: Negative for rash.  Neurological: Positive for sensory change (icy/numbness bilateral feet to ankles.) and weakness. Negative for dizziness and headaches.  Psychiatric/Behavioral: The patient is not nervous/anxious and does not have insomnia.      Past Medical History:  Diagnosis Date  . Degenerative disc disease, lumbar   . Dental crown present   . ETOH abuse   . History of kidney stones   . Hypertension    states under control with meds., has been on med. x 1 yr. - is currently out of med., to see PCP 02/06/2018  . Muscle atrophy 01/2018  . Muscle weakness 01/2018  . Necrotizing myopathy   . Opioid use disorder   . Thoracic aortic aneurysm (TAA) (HCC)    a. seen on CT 08/2020.    Past Surgical History:  Procedure Laterality Date  . ENDOVENOUS ABLATION SAPHENOUS VEIN W/ LASER Right 12-29-2015   endovenous laser ablation right greater saphenous vein, stab phlebectomy > 20 incisions right  leg, sclerotherapy right leg by Gretta Began MD    . MINOR MUSCLE BIOPSY Right 02/11/2018   Procedure: RECTUS FEMORIS MUSCLE BIOPSY;  Surgeon: Darnell Level, MD;  Location: [facility];  Service: General;  Laterality: Right;  . MUSCLE BIOPSY Right 02/11/2018   Procedure: DELTOID MUSCLE BIOPSY;  Surgeon: Darnell Level, MD;   Location: [facility];  Service: General;  Laterality: Right;  . NASAL FRACTURE SURGERY    . TRACHEOSTOMY TUBE PLACEMENT N/A 09/14/2020   Procedure: TRACHEOSTOMY;  Surgeon: Suzanna Obey, MD;  Location: [facility];  Service: ENT;  Laterality: N/A;    Family History  Problem Relation Age of Onset  . Varicose Veins Brother   . 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 He was independent PTA. Per reports that he has never smoked. He has quit using smokeless tobacco. He reports current alcohol use--12 pack beer and "liquer drinks". He reports using 3-4 bunts of marijuana daily. .   Allergies  Allergen Reactions  . Vicodin [Hydrocodone-Acetaminophen] Nausea And Vomiting   Medications Prior to Admission  Medication Sig Dispense Refill  . acetaminophen (TYLENOL) 325 MG tablet Take 2 tablets (650 mg total) by mouth every 6 (six) hours as needed for mild pain, fever or headache.    . albuterol (PROVENTIL) (2.5 MG/3ML) 0.083% nebulizer solution Take 3 mLs (2.5 mg total) by nebulization every 6 (six) hours as needed for wheezing or shortness of breath. 75 mL 12  . apixaban (ELIQUIS) 5 MG TABS tablet Take 1 tablet (5 mg total) by mouth 2 (two) times daily. 60 tablet   . bethanechol (URECHOLINE) 10 MG tablet Take 1 tablet (10 mg total) by mouth 3 (three) times daily.    . buprenorphine-naloxone (SUBOXONE) 8-2 mg SUBL SL tablet Place 1 tablet under the tongue 2 (two) times daily. 30 tablet   . capsicum (ZOSTRIX) 0.075 % topical cream Apply topically 4 (four) times daily. 28.3 g 0  . chlorhexidine (PERIDEX) 0.12 % solution 15 mLs by Mouth Rinse route 2 (two) times daily. 120 mL 0  . clonazePAM (KLONOPIN) 1 MG tablet Take 1 tablet (1 mg total) by mouth 2 (two) times daily. 30 tablet 0  . clotrimazole-betamethasone (LOTRISONE) cream Apply topically 2 (two) times daily. 30 g 0  . docusate sodium (COLACE) 100 MG  capsule Take 1 capsule (100 mg total) by mouth 2 (two) times daily. 10 capsule 0  . feeding supplement (ENSURE ENLIVE / ENSURE PLUS) LIQD Take 237 mLs by mouth 3 (three) times daily between meals. 237 mL 12  . [START ON 09/30/2020] folic acid (FOLVITE) 1 MG tablet Take 1 tablet (1 mg total) by mouth daily.    Melene Muller ON 09/30/2020] furosemide (LASIX) 40 MG tablet Take 1 tablet (40 mg total) by mouth daily. 30 tablet   . gabapentin (NEURONTIN) 100 MG capsule Take 1 capsule (100 mg total) by mouth 3 (three) times daily.    Marland Kitchen ibuprofen (ADVIL) 400 MG tablet Take 1 tablet (400 mg total) by mouth every 6 (six) hours as needed for fever or cramping (If tylenol ineffective, pain in lower extremities). 30 tablet 0  . lidocaine (LIDODERM) 5 % Place 2 patches onto the skin daily. Remove & Discard patch within 12 hours or as directed by MD 30 patch 0  . linezolid (ZYVOX) 600 MG tablet Take 1 tablet (600 mg total) by mouth every 12 (  twelve) hours.    . lip balm (CARMEX) ointment Apply topically as needed for lip care. 7 g 0  . loperamide (IMODIUM) 2 MG capsule Take 1 capsule (2 mg total) by mouth as needed for diarrhea or loose stools. 30 capsule 0  . metoprolol tartrate (LOPRESSOR) 25 MG tablet Take 0.5 tablets (12.5 mg total) by mouth 2 (two) times daily.    . Mouthwashes (MOUTH RINSE) LIQD solution 15 mLs by Mouth Rinse route 2 times daily at 12 noon and 4 pm.  0  . [START ON 09/30/2020] Multiple Vitamin (MULTIVITAMIN WITH MINERALS) TABS tablet Take 1 tablet by mouth daily.    Marland Kitchen nystatin (MYCOSTATIN/NYSTOP) powder Apply topically 2 (two) times daily. 15 g 0  . QUEtiapine (SEROQUEL) 100 MG tablet Take 1 tablet (100 mg total) by mouth at bedtime.    Melene Muller ON 09/30/2020] QUEtiapine (SEROQUEL) 50 MG tablet Take 1 tablet (50 mg total) by mouth daily.    . sodium chloride 0.9 % infusion Inject 250 mLs into the vein continuous.  0  . [START ON 09/30/2020] thiamine 100 MG tablet Take 1 tablet (100 mg total) by mouth  daily.    . traMADol (ULTRAM) 50 MG tablet Take 2 tablets (100 mg total) by mouth every 6 (six) hours as needed (neuropathic pain in lower extremities). 30 tablet     Drug Regimen Review { DRUG REGIMEN CHENID:[id number]}  Home: Home Living Family/patient expects to be discharged to:: Private residence Living Arrangements: Spouse/significant other Available Help at Discharge: Family Type of Home: House Home Access: Stairs to enter Secretary/administrator of Steps: 2 Entrance Stairs-Rails: None Home Layout: One level Home Equipment: None Additional Comments: pt's wife is a Engineer, civil (consulting) at [employer] and is available to assist. All home living information provided by wife who was present during session.   Functional History: Prior Function Level of Independence: Independent  Functional Status:  Mobility: Bed Mobility Overal bed mobility: Needs Assistance Bed Mobility: Supine to Sit Rolling: Min assist Sidelying to sit: Supervision,HOB elevated Supine to sit: +2 for physical assistance,+2 for safety/equipment,HOB elevated,Mod assist General bed mobility comments: pt sittting in recliner at sink shaving with wife and RN upon entrance Transfers Overall transfer level: Needs assistance Equipment used: Rolling walker (2 wheeled) Transfers: Sit to/from Stand Sit to Stand: Min assist,Mod assist Stand pivot transfers: Min guard General transfer comment: Pt unable to rise from chair without UE use for power up requiring mod assist. Demonstrates improved ability to power up to stand with use of B UEs with MIN assist for safety/stabilty from recliner. Ambulation/Gait Ambulation/Gait assistance: Min assist,Mod assist Gait Distance (Feet): 400 Feet (120, 130, 150) Assistive device: Rolling walker (2 wheeled) Gait Pattern/deviations: Step-to pattern,Decreased step length - right,Decreased step length - left,Shuffle,Staggering right,Staggering left,Drifts right/left,Trunk flexed (knees flexed in  stance) General Gait Details: MIN-MOD assist for safety/stability with cues for RW management and to maintain safe proximity to RW. Pt demonstrates increased hip ABD/ER with steps, staggering/drifting Lt/Rt with increased severity when fatigued. pt required standing rest break x3 and close chair follow. Pt requires MOD assist for steadying with turns. Cues required to "tuck butt" to improve upright posture. Pt's HR max 121 bpm and SpO2 lowest of 84%, potentially unreliable reading as monitor was on pt's finger and he was using RW for stabilty. Pt became diaphoretic following ambulation, BP found to be 114/77, pt and wife report that happens at home as well. Pt provided with cool wash cloths. 02 sat 94% on RA at EOS.  RN intermittenly present throughout session. Gait velocity: decr- fair    ADL: ADL Overall ADL's : Needs assistance/impaired Eating/Feeding: NPO Grooming: Set up,Wash/dry face,Sitting,Maximal assistance Upper Body Bathing: Maximal assistance,Sitting Lower Body Bathing: Total assistance Upper Body Dressing : Maximal assistance,Sitting Lower Body Dressing: Minimal assistance,Bed level Lower Body Dressing Details (indicate cue type and reason): min assist to don socks in bed. Needed min asist to don right sock (assist to get over 5th digit) (min assist for task today/not overall LB dressing). Toilet Transfer: Maximal assistance,Stand-pivot,+2 for physical assistance,+2 for safety/equipment,BSC Toileting- Clothing Manipulation and Hygiene: Total assistance Toileting - Clothing Manipulation Details (indicate cue type and reason): rectal tube and catheter  Cognition: Cognition Overall Cognitive Status: Impaired/Different from baseline Orientation Level: Oriented X4 Cognition Arousal/Alertness: Awake/alert Behavior During Therapy: Impulsive,Agitated Overall Cognitive Status: Impaired/Different from baseline Area of Impairment: Memory,Safety/judgement Memory: Decreased short-term  memory Safety/Judgement: Decreased awareness of safety,Decreased awareness of deficits General Comments: pt was impulsive/agitated when unable to negotiate obstacles in hallway with RW picking up RW in the air to get around obstacles x2 and requiring verbal cues from therapist to avoid 3rd obstacle. Difficult to assess due to: Tracheostomy   Blood pressure 104/71, pulse 84, temperature 98.8 F (37.1 C), temperature source Axillary, resp. rate 11, height 5\' 11"  (1.803 m), weight 81.5 kg, SpO2 92 %. Physical Exam Vitals and nursing note reviewed.  Constitutional:      Comments: Alert, fidgeting and restless.  Air loss noted from prior trach site with conversation.   Neck:     Comments: Prior trach with pinpoint hole. Moderate amount of thick yellow drainage on dressing. Air loss noted with phonation.  Musculoskeletal:     Cervical back: Normal range of motion.  Skin:    General: Skin is warm and dry.     Comments: Dry flaky skin on hands, knees feet and upper arms--per wife peeled after swelling.      Results for orders placed or performed during the hospital encounter of 08/27/20 (from the past 48 hour(s))  Basic metabolic panel     Status: None   Collection Time: 09/28/20  6:13 AM  Result Value Ref Range   Sodium 137 135 - 145 mmol/L   Potassium 4.3 3.5 - 5.1 mmol/L   Chloride 102 98 - 111 mmol/L   CO2 27 22 - 32 mmol/L   Glucose, Bld 94 70 - 99 mg/dL    Comment: Glucose reference range applies only to samples taken after fasting for at least 8 hours.   BUN 16 6 - 20 mg/dL   Creatinine, Ser 4.090.64 0.61 - 1.24 mg/dL   Calcium 81.110.0 8.9 - 91.410.3 mg/dL   GFR, Estimated >78>60 >29>60 mL/min    Comment: (NOTE) Calculated using the CKD-EPI Creatinine Equation (2021)    Anion gap 8 5 - 15    Comment: Performed at [facility], [address] [place], [place] [id number]  CBC     Status: Abnormal   Collection Time: 09/28/20  6:13 AM  Result Value Ref Range   WBC 9.8 4.0 -  10.5 K/uL   RBC 3.47 (L) 4.22 - 5.81 MIL/uL   Hemoglobin 10.5 (L) 13.0 - 17.0 g/dL   HCT 08.632.4 (L) 57.839.0 - 46.952.0 %   MCV 93.4 80.0 - 100.0 fL   MCH 30.3 26.0 - 34.0 pg   MCHC 32.4 30.0 - 36.0 g/dL   RDW 62.916.4 (H) 52.811.5 - 41.315.5 %   Platelets 292 150 - 400 K/uL  nRBC 0.0 0.0 - 0.2 %    Comment: Performed at [facility], [address] [place], [place] [address]   MR Lumbar Spine W Wo Contrast  Result Date: 09/28/2020 CLINICAL DATA:  Low back pain, lower extremity neuropathy EXAM: MRI LUMBAR SPINE WITHOUT AND WITH CONTRAST TECHNIQUE: Multiplanar and multiecho pulse sequences of the lumbar spine were obtained without and with intravenous contrast. CONTRAST:  9mL GADAVIST GADOBUTROL 1 MMOL/ML IV SOLN COMPARISON:  None. FINDINGS: Segmentation:  Standard. Alignment:  No significant listhesis. Vertebrae: Mild degenerative endplate irregularity. Mild degenerative endplate marrow edema at L2-L3. Decreased T1 marrow signal may reflect anemia. Conus medullaris and cauda equina: Conus extends to the L1 level. Conus and cauda equina appear normal. No abnormal intrathecal enhancement. Paraspinal and other soft tissues: Posterior paraspinal muscle fatty atrophy. Otherwise unremarkable. Disc levels: Multilevel disc space narrowing, greatest at L5-S1. L1-L2: Shallow right paracentral disc protrusion. No canal or foraminal stenosis. L2-L3:  Mild disc bulge.  No canal or foraminal stenosis. L3-L4:  Minor disc bulge.  No canal or foraminal stenosis. L4-L5:  Minor disc bulge.  No canal or foraminal stenosis. L5-S1: Mild disc bulge with endplate osteophytic ridging. No canal or foraminal stenosis. IMPRESSION: Mild degenerative changes without significant stenosis. Electronically Signed   By: Guadlupe Spanish M.D.   On: 09/28/2020 14:53       Medical Problem List and Plan: 1.  *** secondary to ***  -patient may *** shower  -ELOS/Goals: *** 2.  PAF/Antithrombotics: -DVT/anticoagulation:   Pharmaceutical: Other (comment)--Eliquis.   -antiplatelet therapy: N/A 3. Pain Management: Continue Suboxone bid  --increase gabapentin to 300 mg tid for neuropathy  --continue lidocaine patches and capsaicin cream.  4. Mood: LCSW to follow for evaluation and support.   -antipsychotic agents: N/A 5. Neuropsych: This patient *** capable of making decisions on *** own behalf. 6. Skin/Wound Care: Routine pressure relief measures.  7. Fluids/Electrolytes/Nutrition: Monitor I/O---check lytes in am. 8. MRSA Necrotizing PNA: To continue Zyvox X 2 more weeks. 9. 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  10. Delirium: Encephalopathy resolving--continue Seroquel bid and wean as able as patient gets used to CIR.  11. PAF/Tachycardia: Monitor HR tid--continue metoprolol and Eliquis bid. 12. Alcohol abuse: Has completed librium taper. Continue Seroquel and Klonopin bid.  --continue Folic acid/thiamine  --Patient plans of quitting  13. HTN: Monitor BP tid--on lasix and low dose metoprolol.  14. Anemia of chronic disease?: At baseline of 10. Monitor for signs of bleeding.             --Vitamin B12- 914. MMA- 224 15. Abnormal LFTS: Resolving-->recheck in am.   ***  [name], PA-C 09/29/2020

## 2020-09-28 NOTE — PMR Pre-admission (Signed)
PMR Admission Coordinator Pre-Admission Assessment °  °Patient: Joseph Ayala is an 55 y.o., male °MRN: [id number] °DOB: 11/24/1965 °Height: 5' 11" (180.3 cm) °Weight: 81.5 kg °  °Insurance Information °HMO: yes    PPO:      PCP:      IPA:      80/20:      OTHER:  °PRIMARY: [employer]      Policy#: [id number]      Subscriber: pt °CM Name: Dot Murphy (x [id number])      Phone#: [phone]     Fax#: [phone] °Pre-Cert#: [id number] auth for CIR provided by Dot with [employer] with updates due to Lorraine at fax listed above on 3/30      Employer:  °Benefits:  Phone #: [phone]     Name: n/a °Eff. Date: 07/09/20     Deduct: $0      Out of Pocket Max: $3900 ($0 met)      Life Max: n/a °CIR: $295/day for days 1-6      SNF: 100% °Outpatient:      Co-Pay: $20/visit °Home Health: 100%      Co-Pay:  °DME: 80%     Co-Pay: 20% °Providers:  °SECONDARY:       Policy#:      Phone#:  °  °Financial Counselor:       Phone#:  °  °The “Data Collection Information Summary” for patients in Inpatient Rehabilitation Facilities with attached “Privacy Act Statement-Health Care Records” was provided and verbally reviewed with: Family °  °Emergency Contact Information °        °Contact Information   °  Name Relation Home Work Mobile  °  Hernan,Ginger Spouse [phone]  °  Dearmas, [name] Daughter [phone]   [phone]  °   °  °  °Current Medical History  °Patient Admitting Diagnosis: debility 2/2 necrotizing PNA and myositis °  °History of Present Illness: Pt is a 55 y/o male with PMH of aortic aneurysm, HTN, ETOH abuse, and opioid use (currently on suboxone managed by Dr. Vincent).  Recent dx of myositis.  Admitted to [facility] on 08/27/20 with c/o chest pain, cough, and SOB with diagnosis of respiratory failure 2/2 MRSA/CAP and aspiration.  Pt was intubated on 2/20 and trach placed on 3/9.  Pt with prolonged and complicated hospital course requiring vasopressors for BP management,  chest tube for pleural effusion/empyema (removed 3/2), anasarca requiring massive diuresis, mucus plugging, ketamine infusions for pain control and related agitation, and worsening PNA.  Pt improved with ketamine infusions and tolerated trach collar trials with ultimate decannulation on 3/22.  Restarted suboxone on 3/22 (8mg SL BID per primary pain MD Dr. Vincent).  Therapy evaluations were completed and pt was recommended for CIR.   °  °Patient's medical record from [place] has been reviewed by the rehabilitation admission coordinator and physician. °  °Past Medical History  °    °Past Medical History:  °Diagnosis Date  °• Degenerative disc disease, lumbar    °• Dental crown present    °• ETOH abuse    °• History of kidney stones    °• Hypertension    °  states under control with meds., has been on med. x 1 yr. - is currently out of med., to see PCP 02/06/2018  °• Muscle atrophy 01/2018  °• Muscle weakness 01/2018  °• Necrotizing myopathy    °• Opioid use disorder    °• Thoracic aortic aneurysm (TAA) (HCC)    °    a. seen on CT 08/2020.  °  °  °Family History   °family history includes Varicose Veins in his brother. °  °Prior Rehab/Hospitalizations °Has the patient had prior rehab or hospitalizations prior to admission? No °  °Has the patient had major surgery during 100 days prior to admission? Yes            °  °Current Medications °  °Current Facility-Administered Medications:  °•  0.9 %  sodium chloride infusion, 250 mL, Intravenous, Continuous, Byers, John, MD, Last Rate: 10 mL/hr at 09/16/20 0036, Restarted at 09/16/20 0036 °•  0.9 %  sodium chloride infusion, , Intravenous, Continuous, Roberts, Lindsay B, NP, Stopped at 09/24/20 1626 °•  acetaminophen (TYLENOL) tablet 650 mg, 650 mg, Oral, Q6H PRN, Swayne, Mary M, RPH, 650 mg at 09/29/20 0806 °•  albuterol (PROVENTIL) (2.5 MG/3ML) 0.083% nebulizer solution 2.5 mg, 2.5 mg, Nebulization, Q6H PRN, Byers, John, MD, 2.5 mg at 09/17/20 0801 °•  apixaban (ELIQUIS)  tablet 5 mg, 5 mg, Oral, BID, Babcock, Peter E, NP, 5 mg at 09/29/20 1042 °•  bethanechol (URECHOLINE) tablet 10 mg, 10 mg, Oral, TID, Babcock, Peter E, NP, 10 mg at 09/29/20 1043 °•  buprenorphine-naloxone (SUBOXONE) 8-2 mg per SL tablet 1 tablet, 1 tablet, Sublingual, BID, Babcock, Peter E, NP, 1 tablet at 09/29/20 1043 °•  capsicum (ZOSTRIX) 0.075 % cream, , Topical, QID, Golding, Elizabeth L, DO, Given at 09/29/20 1049 °•  chlorhexidine (PERIDEX) 0.12 % solution 15 mL, 15 mL, Mouth Rinse, BID, McQuaid, Douglas B, MD, 15 mL at 09/29/20 1043 °•  Chlorhexidine Gluconate Cloth 2 % PADS 6 each, 6 each, Topical, Daily, Byers, John, MD, 6 each at 09/28/20 1203 °•  clonazePAM (KLONOPIN) tablet 1 mg, 1 mg, Oral, BID, Babcock, Peter E, NP, 1 mg at 09/29/20 1042 °•  clotrimazole-betamethasone (LOTRISONE) cream, , Topical, BID, Golding, Elizabeth L, DO, 1 application at 09/29/20 1049 °•  docusate sodium (COLACE) capsule 100 mg, 100 mg, Oral, BID, Swayne, Mary M, RPH, 100 mg at 09/29/20 1042 °•  feeding supplement (ENSURE ENLIVE / ENSURE PLUS) liquid 237 mL, 237 mL, Oral, TID BM, McQuaid, Douglas B, MD, 237 mL at 09/29/20 1047 °•  folic acid (FOLVITE) tablet 1 mg, 1 mg, Oral, Daily, Babcock, Peter E, NP, 1 mg at 09/29/20 1042 °•  furosemide (LASIX) tablet 40 mg, 40 mg, Oral, Daily, Swayne, Mary M, RPH, 40 mg at 09/29/20 1043 °•  gabapentin (NEURONTIN) capsule 100 mg, 100 mg, Oral, TID, McQuaid, Douglas B, MD, 100 mg at 09/29/20 1043 °•  hydrocerin (EUCERIN) cream, , Topical, Daily, Wert, Michael B, MD, Given at 09/28/20 1037 °•  ibuprofen (ADVIL) tablet 400 mg, 400 mg, Oral, Q6H PRN, Golding, Elizabeth L, DO, 400 mg at 09/27/20 0427 °•  lidocaine (LIDODERM) 5 % 2 patch, 2 patch, Transdermal, Q24H, Golding, Elizabeth L, DO, 2 patch at 09/28/20 0138 °•  linezolid (ZYVOX) tablet 600 mg, 600 mg, Oral, Q12H, Snider, Cynthia, MD, 600 mg at 09/29/20 1044 °•  lip balm (CARMEX) ointment, , Topical, PRN, Byers, John, MD, 1 application  at 09/20/20 0254 °•  loperamide (IMODIUM) capsule 2 mg, 2 mg, Oral, PRN, Babcock, Peter E, NP, 2 mg at 09/26/20 1242 °•  MEDLINE mouth rinse, 15 mL, Mouth Rinse, q12n4p, McQuaid, Douglas B, MD, 15 mL at 09/28/20 1203 °•  metoprolol tartrate (LOPRESSOR) tablet 12.5 mg, 12.5 mg, Oral, BID, McQuaid, Douglas B, MD, 12.5 mg at 09/28/20 1035 °•  multivitamin with minerals tablet 1 tablet,   1 tablet, Oral, Daily, Swayne, Mary M, RPH, 1 tablet at 09/29/20 1042 °•  nystatin (MYCOSTATIN/NYSTOP) topical powder, , Topical, BID, Byers, John, MD, Given at 09/29/20 1057 °•  QUEtiapine (SEROQUEL) tablet 100 mg, 100 mg, Oral, QHS, Babcock, Peter E, NP, 100 mg at 09/28/20 2136 °•  QUEtiapine (SEROQUEL) tablet 50 mg, 50 mg, Oral, Daily, Babcock, Peter E, NP, 50 mg at 09/29/20 1042 °•  sodium chloride flush (NS) 0.9 % injection 10-40 mL, 10-40 mL, Intracatheter, Q12H, Byers, John, MD, 10 mL at 09/29/20 1044 °•  thiamine tablet 100 mg, 100 mg, Oral, Daily, Swayne, Mary M, RPH, 100 mg at 09/29/20 1043 °•  traMADol (ULTRAM) tablet 100 mg, 100 mg, Oral, Q6H PRN, Golding, Elizabeth L, DO, 100 mg at 09/28/20 0739 °  °Patients Current Diet:  °   °Diet Order   °       °        °    DIET DYS 3 Room service appropriate? Yes; Fluid consistency: Thin  Diet effective now       °   °   °  °   °  °  °Precautions / Restrictions °Precautions °Precautions: Fall °Precaution Comments: rectal tube, impulsive/cognitive impaired, ketamine drip °Restrictions °Weight Bearing Restrictions: No  °  °Has the patient had 2 or more falls or a fall with injury in the past year? No °  °Prior Activity Level °Community (5-7x/wk): was independent prior to admit, driving, on disability with his myositis, no DME at baseline, was toe walking 2/2 myositis. °  °Prior Functional Level °Self Care: Did the patient need help bathing, dressing, using the toilet or eating? Independent °  °Indoor Mobility: Did the patient need assistance with walking from room to room (with or  without device)? Independent °  °Stairs: Did the patient need assistance with internal or external stairs (with or without device)? Independent °  °Functional Cognition: Did the patient need help planning regular tasks such as shopping or remembering to take medications? Independent °  °Home Assistive Devices / Equipment °Home Assistive Devices/Equipment: Brace (specify type) (back brace for myositis) °Home Equipment: None °  °Prior Device Use: Indicate devices/aids used by the patient prior to current illness, exacerbation or injury? None of the above °  °Current Functional Level °Cognition °  Overall Cognitive Status: Impaired/Different from baseline °Difficult to assess due to: Tracheostomy °Orientation Level: Oriented X4 °Safety/Judgement: Decreased awareness of safety,Decreased awareness of deficits °General Comments: pt was impulsive/agitated when unable to negotiate obstacles in hallway with RW picking up RW in the air to get around obstacles x2 and requiring verbal cues from therapist to avoid 3rd obstacle. °   °Extremity Assessment °(includes Sensation/Coordination) °  Upper Extremity Assessment: RUE deficits/detail,LUE deficits/detail °RUE Deficits / Details: AAROM for shoulder flexion, grossly able to flex elbow and extend, grossly able to flex and extend wrist, able to open and close fingers. Shoulder 3-/5 strength, bicep 3/5, tricep 3=/5 tricep, wrist 3/5, hand 3/5 °RUE Coordination: decreased fine motor,decreased gross motor °LUE Deficits / Details: AAROM for shoulder flexion, grossly able to flex elbow and extend, grossly able to flex and extend wrist, able to open and close fingers. Shoulder 2+/5 strength, elbow 3/5, wrist 3/5, hand 3/5 °LUE Coordination: decreased fine motor,decreased gross motor  °Lower Extremity Assessment: Defer to PT evaluation °RLE Deficits / Details: pt able to perform B AROM dorsi/plantar flexion. Pt with limited AROM knee flexion, therapist able to achieve full ROM with  AAROM. Pt was unable to   clear Rt LE from bed with SLR . Pt unable to perform AROM hip ABD, able to achieve passively. Grossly 2+/5 or less for LE strength. °LLE Deficits / Details: pt able to perform AROM knee flexion on L with limited ROM. Pt was able to perform limited SLR and displayed no eccentric lowering control. Grossly 2+/5 or less for LE strength.  °   °ADLs °  Overall ADL's : Needs assistance/impaired °Eating/Feeding: NPO °Grooming: Set up,Wash/dry face,Sitting,Maximal assistance °Upper Body Bathing: Maximal assistance,Sitting °Lower Body Bathing: Total assistance °Upper Body Dressing : Maximal assistance,Sitting °Lower Body Dressing: Minimal assistance,Bed level °Lower Body Dressing Details (indicate cue type and reason): min assist to don socks in bed. Needed min asist to don right sock (assist to get over 5th digit) (min assist for task today/not overall LB dressing). °Toilet Transfer: Maximal assistance,Stand-pivot,+2 for physical assistance,+2 for safety/equipment,BSC °Toileting- Clothing Manipulation and Hygiene: Total assistance °Toileting - Clothing Manipulation Details (indicate cue type and reason): rectal tube and catheter  °   °Mobility °  Overal bed mobility: Needs Assistance °Bed Mobility: Supine to Sit °Rolling: Min assist °Sidelying to sit: Supervision,HOB elevated °Supine to sit: +2 for physical assistance,+2 for safety/equipment,HOB elevated,Mod assist °General bed mobility comments: pt sittting in recliner at sink shaving with wife and RN upon entrance  °   °Transfers °  Overall transfer level: Needs assistance °Equipment used: Rolling walker (2 wheeled) °Transfers: Sit to/from Stand °Sit to Stand: Min assist,Mod assist °Stand pivot transfers: Min guard °General transfer comment: Pt unable to rise from chair without UE use for power up requiring mod assist. Demonstrates improved ability to power up to stand with use of B UEs with MIN assist for safety/stabilty from recliner.  °    °Ambulation / Gait / Stairs / Wheelchair Mobility °  Ambulation/Gait °Ambulation/Gait assistance: Min assist,Mod assist °Gait Distance (Feet): 400 Feet (120, 130, 150) °Assistive device: Rolling walker (2 wheeled) °Gait Pattern/deviations: Step-to pattern,Decreased step length - right,Decreased step length - left,Shuffle,Staggering right,Staggering left,Drifts right/left,Trunk flexed (knees flexed in stance) °General Gait Details: MIN-MOD assist for safety/stability with cues for RW management and to maintain safe proximity to RW. Pt demonstrates increased hip ABD/ER with steps, staggering/drifting Lt/Rt with increased severity when fatigued. pt required standing rest break x3 and close chair follow. Pt requires MOD assist for steadying with turns. Cues required to "tuck butt" to improve upright posture. Pt's HR max 121 bpm and SpO2 lowest of 84%, potentially unreliable reading as monitor was on pt's finger and he was using RW for stabilty. Pt became diaphoretic following ambulation, BP found to be 114/77, pt and wife report that happens at home as well. Pt provided with cool wash cloths. 02 sat 94% on RA at EOS. RN intermittenly present throughout session. °Gait velocity: decr- fair  °   °Posture / Balance Dynamic Sitting Balance °Sitting balance - Comments: Able to maintain upright posture on bed when attempting to get up unsafely and to maintain sitting edge of bed though min guard provided. Poor sitting tolerance. °Balance °Overall balance assessment: Needs assistance °Sitting-balance support: Feet supported °Sitting balance-Leahy Scale: Fair °Sitting balance - Comments: Able to maintain upright posture on bed when attempting to get up unsafely and to maintain sitting edge of bed though min guard provided. Poor sitting tolerance. °Standing balance support: Bilateral upper extremity supported,During functional activity °Standing balance-Leahy Scale: Poor °Standing balance comment: Pt was able to stand for about  ~7min to wash up at sink with MIN assist-MIN guard and recliner behind   him. Pt required assist from therapist to wash his back. Pt with knee flexion in standing and required single UE support on sink. Pt leaning on sink when fatigued. Pt remains HIGH fall risk.  °   °Special needs/care consideration Skin healing trach incision  °  °Previous Home Environment (from acute therapy documentation) °Living Arrangements: Spouse/significant other °Available Help at Discharge: Family °Type of Home: House °Home Layout: One level °Home Access: Stairs to enter °Entrance Stairs-Rails: None °Entrance Stairs-Number of Steps: 2 °Home Care Services: No °Additional Comments: pt's wife is a nurse at [employer] and is available to assist. All home living information provided by wife who was present during session. °  °Discharge Living Setting °Plans for Discharge Living Setting: Patient's home °Type of Home at Discharge: House °Discharge Home Layout: One level °Discharge Home Access: Stairs to enter °Entrance Stairs-Rails: None °Entrance Stairs-Number of Steps: 1 (through carport and 1 through the back) °Discharge Bathroom Shower/Tub: Tub/shower unit,Walk-in shower °Discharge Bathroom Toilet: Standard °Does the patient have any problems obtaining your medications?: No °  °Social/Family/Support Systems °Patient Roles: Spouse °Anticipated Caregiver: spouse, Ginger Muhs °Anticipated Caregiver's Contact Information: [phone] °Ability/Limitations of Caregiver: n/a, she is an RN at [employer] and taking FMLA (has 7 weeks left) °Caregiver Availability: 24/7 °Discharge Plan Discussed with Primary Caregiver: Yes °Is Caregiver In Agreement with Plan?: Yes °Does Caregiver/Family have Issues with Lodging/Transportation while Pt is in Rehab?: No °  °Goals °Patient/Family Goal for Rehab: PT/OT supervision to mod I, SLP mod I °Expected length of stay: 14-16 days °Pt/Family Agrees to Admission and willing to participate: Yes °Program Orientation Provided &  Reviewed with Pt/Caregiver Including Roles  & Responsibilities: Yes ° Barriers to Discharge: Insurance for SNF coverage °  °Decrease burden of Care through IP rehab admission: n/a °Possible need for SNF placement upon discharge: Not anticipat4ed °  °Patient Condition: I have reviewed medical records from [facility], spoken with CM, and spouse. I discussed via phone for inpatient rehabilitation assessment.  Patient will benefit from ongoing PT, OT and SLP, can actively participate in 3 hours of therapy a day 5 days of the week, and can make measurable gains during the admission.  Patient will also benefit from the coordinated team approach during an Inpatient Acute Rehabilitation admission.  The patient will receive intensive therapy as well as Rehabilitation physician, nursing, social worker, and care management interventions.  Due to safety, skin/wound care, disease management, medication administration, pain management and patient education the patient requires 24 hour a day rehabilitation nursing.  The patient is currently mod +2 with mobility and basic ADLs.  Discharge setting and therapy post discharge at home is anticipated.  Patient has agreed to participate in the Acute Inpatient Rehabilitation Program and will admit today. °  °Preadmission Screen Completed By:  Caitlin E Warren, PT, DPT 09/29/2020 11:06 AM °______________________________________________________________________   °Discussed status with Dr.  on 09/29/20  at 11:06 AM  and received approval for admission today. °  °Admission Coordinator:  Caitlin E Warren, PT, DPT time 11:06 AM /Date 09/29/20   °  °Assessment/Plan: °Diagnosis: °1. Does the need for close, 24 hr/day Medical supervision in concert with the patient's rehab needs make it unreasonable for this patient to be served in a less intensive setting? Yes °2. Co-Morbidities requiring supervision/potential complications: necrotizing pneumonia s/p trach/decannulated 3/22- s/p  chest tube, on suboxone for pain; myositis, HTN, EtOh abuse, impaired cognition/swallow °3. Due to bladder management, bowel management, safety, skin/wound care, disease management, medication administration, pain management   and patient education, does the patient require 24 hr/day rehab nursing? Yes °4. Does the patient require coordinated care of a physician, rehab nurse, PT, OT, and SLP to address physical and functional deficits in the context of the above medical diagnosis(es)? Yes °Addressing deficits in the following areas: balance, endurance, locomotion, strength, transferring, bathing, dressing, feeding, grooming, toileting, cognition, swallowing and psychosocial support °5. Can the patient actively participate in an intensive therapy program of at least 3 hrs of therapy 5 days a week? Yes °6. The potential for patient to make measurable gains while on inpatient rehab is good °7. Anticipated functional outcomes upon discharge from inpatient rehab: modified independent and supervision PT, modified independent and supervision OT, modified independent SLP °8. Estimated rehab length of stay to reach the above functional goals is: 14-16 days °9. Anticipated discharge destination: Home °10. Overall Rehab/Functional Prognosis: good °  °  °MD Signature: °  ° °

## 2020-09-28 NOTE — Progress Notes (Addendum)
[facility]    Date of Admission:  08/27/2020      ID: Joseph Ayala is a 55 y.o. male with  MRSA necrotizing pneumonia with acute respiratory distress requiring prolonged intubation Principal Problem:   Necrotizing pneumonia (HCC) Active Problems:   Opioid use disorder   Chronic pain syndrome   Encounter for chest tube placement   CAP (community acquired pneumonia)   Acute respiratory failure with hypoxia (HCC)   Sepsis (HCC)   Chest pain   Pleural effusion   Thoracic aortic aneurysm without rupture (HCC)   Pressure injury of skin   MRSA pneumonia (HCC)   Acute hypoxemic respiratory failure (HCC)   Acute metabolic encephalopathy    Subjective: Afebrile, underwent MRI of spine which was negative for any signs of infection. He reports noticing having some burning sensation to his feet bilaterally. He is fatigued from pt.  decannulated -no longer has tracheostomy  12 point ros is negative except what is mentioned above.  Medications:  . apixaban  5 mg Oral BID  . bethanechol  10 mg Oral TID  . buprenorphine-naloxone  1 tablet Sublingual BID  . capsicum   Topical QID  . chlorhexidine  15 mL Mouth Rinse BID  . Chlorhexidine Gluconate Cloth  6 each Topical Daily  . clonazePAM  1 mg Oral BID  . clotrimazole-betamethasone   Topical BID  . docusate sodium  100 mg Oral BID  . feeding supplement  237 mL Oral TID BM  . folic acid  1 mg Oral Daily  . furosemide  40 mg Oral Daily  . gabapentin  100 mg Oral TID  . hydrocerin   Topical Daily  . lidocaine  2 patch Transdermal Q24H  . linezolid  600 mg Oral Q12H  . mouth rinse  15 mL Mouth Rinse q12n4p  . metoprolol tartrate  12.5 mg Oral BID  . multivitamin with minerals  1 tablet Oral Daily  . nystatin   Topical BID  . QUEtiapine  100 mg Oral QHS  . [START ON 09/29/2020] QUEtiapine  50 mg Oral Daily  . sodium chloride flush  10-40 mL Intracatheter Q12H  . thiamine  100 mg Oral Daily     Objective: Vital signs in last 24 hours: Temp:  [97.9 F (36.6 C)-98.6 F (37 C)] 97.9 F (36.6 C) (03/23 0400) Pulse Rate:  [81-96] 88 (03/23 1000) Resp:  [10-23] 13 (03/23 1000) BP: (85-126)/(51-101) 126/101 (03/23 1000) SpO2:  [88 %-96 %] 94 % (03/23 1000) Weight:  [81.5 kg] 81.5 kg (03/23 0500) Physical Exam  Constitutional: He is oriented to person, place, and time. He appears well-developed and well-nourished. No distress.  HENT:  Mouth/Throat: Oropharynx is clear and moist. No oropharyngeal exudate. Bandage at site of previous trach Cardiovascular: Normal rate, regular rhythm and normal heart sounds. Exam reveals no gallop and no friction rub.  No murmur heard.  Pulmonary/Chest: Effort normal and breath sounds normal. No respiratory distress. He has no wheezes.  Abdominal: Soft. Bowel sounds are normal. He exhibits no distension. There is no tenderness.  Lymphadenopathy:  He has no cervical adenopathy.  Neurological: He is alert and oriented to person, place, and time.  Skin: Skin is warm and dry. Scaling. +splinter hemorrhage on left hand Psychiatric: He has a normal mood and affect. His behavior is normal. +stutter    Lab Results Recent Labs    09/27/20 0500 09/28/20 0613  WBC 10.4 9.8  HGB 10.2* 10.5*  HCT  31.6* 32.4*  NA 134* 137  K 3.7 4.3  CL 101 102  CO2 24 27  BUN 13 16  CREATININE 0.65 0.64    Microbiology: reviewed Studies/Results: MR Lumbar Spine W Wo Contrast  Result Date: 09/28/2020 CLINICAL DATA:  Low back pain, lower extremity neuropathy EXAM: MRI LUMBAR SPINE WITHOUT AND WITH CONTRAST TECHNIQUE: Multiplanar and multiecho pulse sequences of the lumbar spine were obtained without and with intravenous contrast. CONTRAST:  61mL GADAVIST GADOBUTROL 1 MMOL/ML IV SOLN COMPARISON:  None. FINDINGS: Segmentation:  Standard. Alignment:  No significant listhesis. Vertebrae: Mild degenerative endplate irregularity. Mild degenerative endplate marrow edema  at L2-L3. Decreased T1 marrow signal may reflect anemia. Conus medullaris and cauda equina: Conus extends to the L1 level. Conus and cauda equina appear normal. No abnormal intrathecal enhancement. Paraspinal and other soft tissues: Posterior paraspinal muscle fatty atrophy. Otherwise unremarkable. Disc levels: Multilevel disc space narrowing, greatest at L5-S1. L1-L2: Shallow right paracentral disc protrusion. No canal or foraminal stenosis. L2-L3:  Mild disc bulge.  No canal or foraminal stenosis. L3-L4:  Minor disc bulge.  No canal or foraminal stenosis. L4-L5:  Minor disc bulge.  No canal or foraminal stenosis. L5-S1: Mild disc bulge with endplate osteophytic ridging. No canal or foraminal stenosis. IMPRESSION: Mild degenerative changes without significant stenosis. Electronically Signed   By: Guadlupe Spanish M.D.   On: 09/28/2020 14:53   3/11 TEE no vegetations.  Assessment/Plan: Necrotizing MRSA pneumonia  = protracted course likely for heavy disease burden. Currently on day 15 of ceftaroline, recommend to change to oral linezolid 600mg  BID x 2 additional weeks. Recommend to check cbc weekly while on linezolid. If he doesn't tolerate linezolid, cana switch to doxycycline 100mg  bid.  - please remove picc line since no longer needed  Peripheral neuropathy = continue with thiamine supplementation, also can consider to start neurontin 300mg  daily to see if any improvement and titrate up  Will sign off.  [facility] Cell: [phone] Pager: [phone]  09/28/2020, 4:57 PM

## 2020-09-28 NOTE — Progress Notes (Signed)
Palliative Care Progress Note  See late entry note dated 3/21 for recommendations re: pain and symptom management and ketamine discontinuation. He continues to have moderate to severe peripheral neuropathy. Increased Tramadol dose-ok to give with suboxone. May consider scheduling the tramadol depending on how he does after stopping the ketamine infusion. Added lidoderm transdermal to lower extremities.   Anderson Malta, DO Palliative Medicine

## 2020-09-28 NOTE — Progress Notes (Signed)
Physical Therapy Treatment Patient Details Name: Joseph Ayala MRN: [id number] DOB: [birth year] Today's Date: 09/28/2020    History of Present Illness Patient is a 55 y.o. male s/p tracheotomy on 09/14/2020 who presented to the ED with complaints of chest pain, cough, and SOB admitted for acute hypoxemic respiratory failure secondary to MRSA community-acquired pneumonia / aspiration with PMH significant for aortic aneurysm, HTN,  ETOH abuse, and opioid use.    PT Comments    Pt progressing toward acute therapy goals with increased ambulation distance and standing duration. Pt was able to stand for ~3min with UE support and MIN assist-MIN guard from therapist for safety. Pt requires Min assist for safety/steadying with sit to stand transfers and use of B UEs and MOD assist to power up to stand without UE support. Pt required MIN-MOD assist for safety/stability with cues for RW management and to maintain safe proximity to RW. Pt demonstrates knee flexion in standing, increased hip ABD/ER with steps, staggering/drifting Lt/Rt with increased severity when fatigued requiring standing rest break x3 and close chair follow with cues for upright posture. Pt's HR max 121 bpm and SpO2 lowest of 84%, potentially unreliable O2 reading. O2 sat at 94% on RA at EOS. Pt remains highly motivated to participate in therapy sessions to maximize functional lmobilityand return to PLOF. Pt will benefit from skilled PT to increase independence and safety with mobility. Acute therapy to follow up during stay to progress functional mobility as able to ensure safe dischare home.     Follow Up Recommendations  CIR     Equipment Recommendations  None recommended by PT    Recommendations for Other Services Rehab consult;Speech consult     Precautions / Restrictions Precautions Precautions: Fall Restrictions Weight Bearing Restrictions: No    Mobility  Bed Mobility Overal bed mobility: Needs Assistance Bed Mobility:  Supine to Sit   Sidelying to sit: Supervision;HOB elevated       General bed mobility comments: pt sittting in recliner at sink shaving with wife and RN upon entrance    Transfers Overall transfer level: Needs assistance Equipment used: Rolling walker (2 wheeled) Transfers: Sit to/from Stand Sit to Stand: Min assist;Mod assist Stand pivot transfers: Min guard       General transfer comment: Pt unable to rise from chair without UE use for power up requiring mod assist. Demonstrates improved ability to power up to stand with use of B UEs with MIN assist for safety/stabilty from recliner.  Ambulation/Gait Ambulation/Gait assistance: Min assist;Mod assist Gait Distance (Feet): 400 Feet (120, 130, 150) Assistive device: Rolling walker (2 wheeled) Gait Pattern/deviations: Step-to pattern;Decreased step length - right;Decreased step length - left;Shuffle;Staggering right;Staggering left;Drifts right/left;Trunk flexed (knees flexed in stance) Gait velocity: decr- fair   General Gait Details: MIN-MOD assist for safety/stability with cues for RW management and to maintain safe proximity to RW. Pt demonstrates increased hip ABD/ER with steps, staggering/drifting Lt/Rt with increased severity when fatigued. pt required standing rest break x3 and close chair follow. Pt requires MOD assist for steadying with turns. Cues required to "tuck butt" to improve upright posture. Pt's HR max 121 bpm and SpO2 lowest of 84%, potentially unreliable reading as monitor was on pt's finger and he was using RW for stabilty. Pt became diaphoretic following ambulation, BP found to be 114/77, pt and wife report that happens at home as well. Pt provided with cool wash cloths. 02 sat 94% on RA at EOS. RN intermittenly present throughout session.   Stairs  Wheelchair Mobility    Modified Rankin (Stroke Patients Only)       Balance Overall balance assessment: Needs assistance Sitting-balance  support: Feet supported Sitting balance-Leahy Scale: Fair     Standing balance support: Bilateral upper extremity supported;During functional activity Standing balance-Leahy Scale: Poor Standing balance comment: Pt was able to stand for about ~45min to wash up at sink with MIN assist-MIN guard and recliner behind him. Pt required assist from therapist to wash his back. Pt with knee flexion in standing and required single UE support on sink. Pt leaning on sink when fatigued. Pt remains HIGH fall risk.                            Cognition Arousal/Alertness: Awake/alert Behavior During Therapy: Impulsive;Agitated Overall Cognitive Status: Impaired/Different from baseline Area of Impairment: Memory;Safety/judgement                     Memory: Decreased short-term memory   Safety/Judgement: Decreased awareness of safety;Decreased awareness of deficits     General Comments: pt was impulsive/agitated when unable to negotiate obstacles in hallway with RW picking up RW in the air to get around obstacles x2 and requiring verbal cues from therapist to avoid 3rd obstacle.      Exercises Other Exercises Other Exercises: Shoulder flexion with orange band x 10 reps each arm, bicep curl with orange band x 10 each arm, tricep extension x 10 each arm, chair push ups x 5, pull aparts x 10 with orange band    General Comments General comments (skin integrity, edema, etc.): 5X sit to stand: 51.89s; pt requiring MOD assist to power up to stand from recliner without use of UEs x2; pt with use of Bil UEs to complete last 3 reps.      Pertinent Vitals/Pain Pain Assessment: Faces Pain Score: 7  Faces Pain Scale: Hurts a little bit Pain Location: B Heels Pain Descriptors / Indicators: Discomfort;Sore Pain Intervention(s): Limited activity within patient's tolerance;Monitored during session;Repositioned    Home Living                      Prior Function            PT  Goals (current goals can now be found in the care plan section) Acute Rehab PT Goals Patient Stated Goal: get stronger PT Goal Formulation: With patient Time For Goal Achievement: 09/28/20 Potential to Achieve Goals: Good Progress towards PT goals: Progressing toward goals    Frequency    Min 4X/week      PT Plan Current plan remains appropriate    Co-evaluation       OT goals addressed during session: ADL's and self-care;Strengthening/ROM      AM-PAC PT "6 Clicks" Mobility   Outcome Measure  Help needed turning from your back to your side while in a flat bed without using bedrails?: A Little Help needed moving from lying on your back to sitting on the side of a flat bed without using bedrails?: A Little Help needed moving to and from a bed to a chair (including a wheelchair)?: A Little Help needed standing up from a chair using your arms (e.g., wheelchair or bedside chair)?: A Little Help needed to walk in hospital room?: A Lot Help needed climbing 3-5 steps with a railing? : Total 6 Click Score: 15    End of Session Equipment Utilized During Treatment: Gait belt Activity Tolerance: Patient  tolerated treatment well Patient left: in chair;with call bell/phone within reach;with family/visitor present;with chair alarm set Nurse Communication: Mobility status (nurse present intermittently  throughout session) PT Visit Diagnosis: Muscle weakness (generalized) (M62.81);Difficulty in walking, not elsewhere classified (R26.2)     Time: 6712-4580 PT Time Calculation (min) (ACUTE ONLY): 41 min  Charges:  $Gait Training: 8-22 mins $Therapeutic Activity: 23-37 mins                     Lauren Youngblood, SPT  Acute rehab    Lauren Youngblood 09/28/2020, 3:06 PM

## 2020-09-28 NOTE — TOC Progression Note (Signed)
Transition of Care [facility]) - Progression Note    Patient Details  Name: Joseph Ayala MRN: [id number] Date of Birth: [birth year]  Transition of Care [facility] CM/SW Contact  Golda Acre, RN Phone Number: 09/28/2020, 7:49 AM  Clinical Narrative:    Impression/Plan:    Chronic pain, acute metabolic encephalopathy> he has done remarkably well with a slow ketamine infusion wean; unclear cause of the neuropathic pain in his feet.  Plan to start suboxone today, have ketamine off by tomorrow morning.  Continue seroquel and clonazepam  Necrotizing MRSA pneumonia> continuing antibiotic therapy per ID, transition from ceftaroline today to orals per ID, would use linezold then plan at least another 2 weeks of therapy  Myositis> unclear etiology, continuing prednisone then follow up with neurology  Acute respiratory failure with hypoxemia> decannulate today   Decannulation on [id number]. referal to cir and CIR following-insurance auth started on 032222   Expected Discharge Plan: IP Rehab Facility Barriers to Discharge: Continued Medical Work up  Expected Discharge Plan and Services Expected Discharge Plan: IP Rehab Facility   Discharge Planning Services: CM Consult   Living arrangements for the past 2 months: Single Family Home                                       Social Determinants of Health (SDOH) Interventions    Readmission Risk Interventions No flowsheet data found.

## 2020-09-28 NOTE — Progress Notes (Addendum)
Occupational Therapy Treatment Patient Details Name: Joseph Ayala MRN: [id number] DOB: Apr 27, 1966 Today's Date: 09/28/2020    History of present illness Patient is a 55 y.o. male s/p tracheotomy on 09/14/2020 who presented to the ED with complaints of chest pain, cough, and SOB admitted for acute hypoxemic respiratory failure secondary to MRSA community-acquired pneumonia / aspiration with PMH significant for aortic aneurysm, HTN,  ETOH abuse, and opioid use.   OT comments  Patient able to don socks with min assist supine in bed - needing assistance to get sock over 5th digit in order to pull up. He needed increased time and demonstrated some difficulty. Patient supervision to transfer to side of bed with use of bed rail and min guard to stand and transfer to recliner with RW with therapist managing lines and leads. Patient performed UE exercises seated in recliner with orange theraband with therapist showing patient how to perform on his own as well. Patient has made significant progress since last treatment. Continue to recommend aggressive short term rehab at discharge.   Follow Up Recommendations  CIR    Equipment Recommendations  Other (comment) (TBD)    Recommendations for Other Services      Precautions / Restrictions Precautions Precautions: Fall Restrictions Weight Bearing Restrictions: No       Mobility Bed Mobility Overal bed mobility: Needs Assistance Bed Mobility: Supine to Sit   Sidelying to sit: Supervision;HOB elevated       Use of bed rail to transfer into sitting.    Transfers Overall transfer level: Needs assistance Equipment used: Rolling walker (2 wheeled) Transfers: Sit to/from Stand Sit to stand: min guard to stand with RW. Stand pivot transfers: Min guard       General transfer comment: Min guard with RW to stand and take steps to recliner with therapist assisting with line management.   Balance Overall balance assessment: Needs  assistance Sitting-balance support: Feet supported Sitting balance-Leahy Scale: Fair     Standing balance support: Bilateral upper extremity supported;During functional activity Standing balance-Leahy Scale: Poor Standing balance comment: Reliant on UEs for standing standing balance.                           ADL either performed or assessed with clinical judgement   ADL   Lower Body Dressing: Minimal assistance;Bed level Lower Body Dressing Details (indicate cue type and reason): min assist to don socks in bed. Needed min asist to don right sock (assist to get over 5th digit) (min assist for task today/not overall LB dressing).                     Vision   Vision Assessment?: No apparent visual deficits   Perception     Praxis      Cognition Arousal/Alertness: Awake/alert Behavior During Therapy: Impulsive;Agitated Overall Cognitive Status: Impaired/Different from baseline Area of Impairment: Memory;Safety/judgement                     Memory: Decreased short-term memory   Safety/Judgement: Decreased awareness of safety;Decreased awareness of deficits            Exercises Other Exercises Other Exercises: Shoulder flexion with orange band x 10 reps each arm, bicep curl with orange band x 10 each arm, tricep extension x 10 each arm, chair push ups x 5, pull aparts x 10 with orange band   Shoulder Instructions  Pertinent Vitals/ Pain       Pain Assessment: Faces Pain Score: 7  Faces Pain Scale: Hurts a little bit Pain Location: B Heels Pain Descriptors / Indicators: Discomfort;Sore Pain Intervention(s): Limited activity within patient's tolerance;Monitored during session;Repositioned  Home Living                                          Prior Functioning/Environment              Frequency  Min 2X/week        Progress Toward Goals  OT Goals(current goals can now be found in the care plan  section)  Progress towards OT goals: Progressing toward goals  Acute Rehab OT Goals Patient Stated Goal: get stronger OT Goal Formulation: With family Time For Goal Achievement: 10/05/20 Potential to Achieve Goals: Good  Plan Discharge plan remains appropriate    Co-evaluation          OT goals addressed during session: ADL's and self-care;Strengthening/ROM      AM-PAC OT "6 Clicks" Daily Activity     Outcome Measure   Help from another person eating meals?: A Little Help from another person taking care of personal grooming?: A Little Help from another person toileting, which includes using toliet, bedpan, or urinal?: A Lot Help from another person bathing (including washing, rinsing, drying)?: A Lot Help from another person to put on and taking off regular upper body clothing?: A Little Help from another person to put on and taking off regular lower body clothing?: A Lot 6 Click Score: 15    End of Session Equipment Utilized During Treatment: Oxygen  OT Visit Diagnosis: Unsteadiness on feet (R26.81);Other abnormalities of gait and mobility (R26.89);Muscle weakness (generalized) (M62.81)   Activity Tolerance Patient tolerated treatment well   Patient Left in chair;with call bell/phone within reach   Nurse Communication Mobility status        Time: 3267-1245 OT Time Calculation (min): 19 min  Charges: OT General Charges $OT Visit: 1 Visit OT Treatments $Therapeutic Exercise: 8-22 mins  Waldron Session, OTR/L Acute Care Rehab Services  Office [phone] Pager: [phone]    Kelli Churn 09/28/2020, 3:20 PM

## 2020-09-28 NOTE — Progress Notes (Addendum)
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 PCCM initially consulted for evaluation for bronchoscopy for possible mucous plugging. Transferred to ICU after worsening respiratory failure and mental status requiring intubation on 2/21. ICU course complicated by hypotension requiring pressors, chest tube placement for empyema s/p intrapleural lytics. Trach 3/9.   Hx of poorly explained myopathy, self medicating, pulse steroids / methotrexate.  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 Intubated. Bronch ETT 2/21 >> 3/9 R Radial Aline 2/20 >> 3/4  L IJ TLC 2/21 >> 3/4 CTA Chest 2/19 >> right sided consolidation and ground glass opacities in the right and middle lobe. Background centrilobular emphysema present. No pulmonary embolism Ceftriaxone 2/20 >> 2/21 Azithro 2/20 >> 2/21 Vanc 2/21 >> 3/4  Zosyn 2/21 >> 2/23 2/22 Worsening respiratory status and vasopressor requirement. Agitation on vent 2/24 Off pressors. Right chest tube placement. Propofol changed versed as TRG increased R CT 2/24 >> 3/2 CT CAP 2/24 >> Interval enlargement of right pleural  effusion with some loculation. Progression of right airspace disease with cavitation in right middle/lower lobe. Anasarca 2/25 About 650 out of CT since insertion. Stable vent requirements. Sedation and agitation a challenge / librium added. Fever. TPA into chest tube 2/26 FOB for mucus plugging 2/28 Repeat CT with little residual pleural fluid, largely consolidated lung. On vent. Oral oxy added. CT Chest w/o 2/28 >> no large amount of residual pleural fluid present with most of the low-density abnormality in the right mid to lower chest felt to represent necrotic and consolidated RLL, RML. There is still some fluid component in the posterior pleural space superior to the tip of the indwelling chest tube  3/01 Ceiling of IV gtt's reduced, weaning on PSV 10/5 - weaned for 8h. Diuresis.   3/02 Sedation changed, hypotension, levophed initiated. FOB with BAL, chest tube removed.   3/04 Discontinue aline, PICC placed LUE LUE PICC 3/4 >>  Linezolid 3/4 >> 3/8  3/06 Improved fever curve on linezolid, challenging sedation 3/07 Difficulties with sedation > changed back to versed, dilaudid. Oral methadone added.  3/08 R white out on CXR / mucus plugging. Methadone stopped, re-challenge with ketamine. FOB with BAL.  CT Chest 3/8 >> persistent area of RML, RLL airspace disease with decreasing cavitation and increasing consolidation since prior study, increased airspace disease throughout the aerated RUL, consistent with progressive PNA. Stable LLL consolidation, trace L pleural effusion  Ceftaroline 3/8 >>  Trach Janace Hoard) 3/9 >>  3/09 Calmer on ketamine at higher dose, on propofol 20 mcg, dilaudid 32m Ketamine increased. Propofol stopped during day. 3/11 Remains on ketamine, dilaudid down to 193m/ weaning  TEE 3/11 >> EF 45-50%  No veg/ L to R ASD  3/12 added clonidine  to help with dilaudid wean completely off  drip stopped and still needed prn for HBP 3/12 added reglan 5 mg IV q6h  for emesis  Started  reglan p emesis 3/13 > resolved and ready to restart tf No supplemental dilaudid overnight  3/14-3/18.  added librium taper (completed 3/18). ketamine tapered slowly titrated down from 1.2 down to 0.8 ATC trials started on 3/15 and by 3/18 completely liberated from vent. SLP and PMV trials started 3/17 and MBS on 3/18. LMWH changed to [place] on 3/18 for afib. Dc foley  3/18 pulled out panda 3/19 some hypotention, clonidine weaned off. 3/20 neuropathic pain ketamine reduced and neurontin added.  3/21 attempting trach capping trial. Dec Ketamine to 0.57m/kg/hr. lopressor dose decreased d/t low BP 3/22 Trach removed/decannulated. Added back Suboxone 864mSL after consulting his Primary pain MD Dr ViEvette DoffingLidoderm patch and PRN tramadol started 3/23 Suboxone increased to 47m57mL BID as per recs. Ketamine stopped. Clonazepam changed to BID, Seroquel changed to 100m73m and 200mg49m Awaiting CIR referral. Still having neuropathic pain.MRI  ordered  Interim History / Subjective:   Doing well. Still having pain in LEs as described before as pins and needles    Objective   Blood pressure 108/72, pulse 82, temperature 97.9 F (36.6 C), temperature source Oral, resp. rate 11, height _0  (1.803 m), weight 81.5 kg, SpO2 95 %.        Intake/Output Summary (Last 24 hours) at 09/28/2020 0746 Last data filed at 09/28/2020 0600 Gross per 24 hour  Intake 2865.48 ml  Output 3450 ml  Net -584.52 ml   Filed Weights   09/24/20 0412 09/25/20 0500 09/28/20 0500  Weight: 82.7 kg 84 kg 81.5 kg    Examination: General this is a 55 ye84 old WM he is resting in bed and in no distress HENT NCAT trach dressing in place. Phonation stronger pulm cl dec bases Card rrr abd soft not tender Ext warm and dry  Neuro intact still has paresthesia both LEs but strength is good gu condom cath    Labs/imaging personally reviewed   See below Currently no pending   Resolved Hospital Problem list   Elevated LFTs -  likely combination of hx and congestive hepatopathy AKI Aspiration pneumonia/pneumonitis in setting of altered mental status Septic shock (off pressors as of 2/24) Cardiogenic shock  Demand ischemia Parapneumonic Effusion versus Empyema:S/p chest tube placement 2/24, tPA 2/25, 2/26.  Chest tube removed 3/2.  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 Has had 15 days. The plan was to change to oral coverage as directed by ID. Will message them for follow up recs. Stop date of rx still TBD  S/p removal of trach 3/22 Plan Keep occlusive dressing in place today, effective 3/24 can change w/ regular band aid daily. 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 No reports noted in literature Plan Cont gabapentin, PRN tramadol and Lidoderm patch.  Will get MRI spine to be sure no abscess given hx  Atrial fibrillation Plan Cont doac and lopressor   Acute metabolic encephalopathy/agitated delirium > improved greatly with slow ketamine wean Significant alcohol use and narcotic dependence Plan Dc Ketamine today Will increase Suboxone to 33m bid (per my discussion w. Dr VEvette Doffing Dec Seroquel to 1039mday time and 200 at HS Cont Gabapentin  Cont Clonazepam; change to BID as anxiety not an issue Tramadol PRN (can be changed to scheduled per palliative recs for pain if needed but want to hold off as we are increasing Suboxone) SLP recommending cognitive linguistic eval  if rehab accepted   Fluid and electrolyte imbalance: intermittent  Plan De-escalate lab draws  Moderate protein calorie malnutrition Plan Cont dysphagia 3 diet w/ clear liq   Macrocytic anemia Plan Trending cbc Transfuse trigger for   Baseline necrotic myositis> at this point it's hard to determine if he has had any sort of recurrence of this as his physical deconditioning seems mostly in keeping with critical illness myopathy (he's surprisingly strong actually). Suspect restarting prednisone and methotrexate may do more harm than good right now given necrotizing pneumonia Wife indicates he only took prednisone, he wasn't taking methotrexate Plan Holding MTX and pred F/u out pt      Best practice (evaluated daily)  Diet:  Oral Pain/Anxiety/Delirium protocol (if indicated): No VAP protocol (if indicated): Not indicated DVT prophylaxis: Systemic AC GI prophylaxis: N/A Glucose control:  SSI No Central venous access:  Yes, and it is still needed Arterial line:  N/A Foley:  N/A Mobility:  OOB  PT consulted: Yes Last date of multidisciplinary goals of care discussion [3/13] goc discussed Code Status:  full code   Disposition: out of ICU. If his IV abx are no longer needed will dc PICC today as well. Will ask triad to assume care.   PeErick ColaceCNP-BC LeRedwayager # [id number] # [phone] no answer

## 2020-09-28 NOTE — Discharge Instructions (Signed)

## 2020-09-28 NOTE — Progress Notes (Signed)
Pt seen, asleep, resting comfortably, stoma found covered, HR89, rr14, spo2 93% on room air.

## 2020-09-28 NOTE — Discharge Summary (Incomplete)
Physician Discharge Summary         Patient ID: Joseph Ayala MRN: [id number] DOB/AGE: 1966-01-30 55 y.o.  Admit date: 08/27/2020 Discharge date: 09/29/2020  Discharge Diagnoses:    Necrotizing MRSA PNA New onset HFrEF > improved on repeat echo AKI Aspiration pneumonia/pneumonitis in setting of altered mental status Septic shock (off pressors as of 2/24) Cardiogenic shock  Demand ischemia Parapneumonic Effusion versus Empyema:S/p chest tube placement 2/24, tPA 2/25, 2/26. Chest tube removed 3/2.  Acute hypoxic respiratory failure New onset atrial flutter/SVT secondary to sepsis Trach dependence (decannualted 3/22) Thoracic aortic aneurysm Neuropathic pain in feet> unclear etiology, ketamine related? No reports noted in literature Atrial fibrillation Acute metabolic encephalopathy/agitated delirium > improved greatly with slow ketamine wean Significant alcohol use and narcotic dependence Fluid and electrolyte imbalance: intermittent  Moderate protein calorie malnutrition Macrocytic anemia Baseline necrotic myositis Elevated LFTs - likely combination of hx and congestive hepatopathy   Discharge summary    2/19 Arrived to ED for cough and chest pain x 1 week.2/20 Admitted to [facility] in early am. Intubated. Bronch. Radial aline placed. Started on CTX and azithromycin. CTA Chest 2/19 >> right sided consolidation and ground glass opacities in the right and middle lobe. Background centrilobular emphysema present. No pulmonary embolism 2/21 changed to vanc and zosyn 2/22 Worsening respiratory status and vasopressor requirement. Agitation on vent 2/24 Off pressors. Right chest tube placement. Propofol changed versed as TRG increased R CT 2/24 >> 3/2 CT CAP 2/24 >> Interval enlargement of right pleural effusion with some loculation. Progression of right airspace disease with cavitation in right middle/lower lobe. Anasarca 2/26 FOB for mucus plugging 2/28 Repeat CT with little  residual pleural fluid, largely consolidated lung. On vent. Oral oxy added. CT Chest w/o 2/28 >> no large amount of residual pleural fluid present with most of the low-density abnormality in the right mid to lower chest felt to represent necrotic and consolidated RLL, RML. There is still some fluid component in the posterior pleural space superior to the tip of the indwelling chest tube  3/01 Ceiling of IV gtt's reduced, weaning on PSV 10/5 - weaned for 8h. Diuresis.  3/02 Sedation changed, hypotension, levophed initiated. FOB with BAL, chest tube removed.  3/04 Discontinue aline, PICC placed LUE. CVL removed. Started Linezolid 3/4.  3/06 Improved fever curve on linezolid, challenging sedation 3/07 Difficulties with sedation >changed back to versed, dilaudid. Oral methadone added.  3/08 R white out on CXR / mucus plugging. Methadone stopped, re-challenge with ketamine. FOB with BAL.  CT Chest 3/8 >>persistent area of RML, RLL airspace disease with decreasing cavitation and increasing consolidation since prior study, increased airspace disease throughout the aerated RUL, consistent with progressive PNA. Stable LLL consolidation, trace L pleural effusion  Ceftaroline 3/8 started Zyvox stopped.  Lurline Idol Janace Hoard) 3/9 >> 3/09 Calmer on ketamine at higher dose, on propofol 20 mcg, dilaudid 45m Ketamine increased. Propofol stopped during day. 3/11 Remains on ketamine, dilaudid down to 172m/ weaning  TEE 3/11 >>EF 45-50% No veg/ L to R ASD  3/12 added clonidine to help with dilaudid wean completely off drip stopped and still needed prn for HBP 3/12 added reglan 5 mg IV q6h for emesis  Started reglan p emesis 3/13 >resolved and ready to restart tf No supplemental dilaudid overnight  3/14-3/18.added librium taper (completed 3/18). ketaminetapered slowly titrated down from 1.2 down to 0.8 ATC trials started on 3/15 and by 3/18 completely liberated from vent. SLP and PMV trials started 3/17 and MBS  on 3/18. LMWH changed to [place] on 3/18 for afib.Dc foley 3/18 pulled out panda 3/19 some hypotention, clonidine weaned off. 3/20 neuropathic pain ketamine reduced and neurontin added.  3/21 attempting trach capping trial. Dec Ketamine to 0.27m/kg/hr. lopressor dose decreased d/t low BP 3/22 Trach removed/decannulated. Added back Suboxone 836mSL after consulting his Primary pain MD Dr ViEvette DoffingLidoderm patch and PRN tramadol started 3/23 Suboxone increased to 59m45mL BID as per recs. Ketamine stopped. Clonazepam changed to BID, Seroquel changed to 100m61m and 200mg16m Awaiting CIR referral. Still having neuropathic pain.MRI  ordered & did not show any evidence of infection involving spine. His Ceftaroline was discontinued by infectious disease. He was placed on oral linazolid.  3/24 cleared for CIR. Pain better controlled. Still weak. PICC ordered to be removed.   Discharge Plan by Active Problems    Necrotizing MRSA PNA Plan Day 2/14 oral linezolid recommend weekly CBC during therapy (per Dr SnideStorm Frisk 3/23 if doesn't tolerate this can change to 2 weeks of Doxy 100mg 83m  Please ensure his PICC is removed. The order has been placed.   Neuropathic pain in feet> unclear etiology, ketamine related? No reports noted in literature Plan Cont gabapentin, PRN tramadol and Lidoderm patch.  Will get MRI spine to be sure no abscess given hx Cont thiamine. Will defer further up-titration at this point but could consider further up-titration of Neurontin (would rec you run this by his pain management MD Dr VincenEvette Doffingcide to do this)   S/p removal of trach 3/22 Plan Keep occlusive dressing in place today, effective 3/24 can change w/ regular band aid daily. 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  Will get MRI spine to be sure no abscess given hx   Significant alcohol use and narcotic dependence Resolved Acute metabolic encephalopathy/agitated delirium  with slow ketamine wean. Now off Ketamine.  Plan Suboxone to 59mg bi17mper my discussion w. Dr VincentEvette Doffingeroquel to 50 mg day time and 100 at HS; [facility].  Cont Gabapentin  Cont Clonazepam; change to BID as anxiety not an issue; would decrease this by 1/2 every 48hrs (last change 3/23) Tramadol PRN (can be changed to scheduled per palliative recs for pain if needed but want to hold off as we are increasing Suboxone) SLP recommending cognitive linguistic eval if rehab accepted  Fluid and electrolyte imbalance: intermittent  Plan De-escalate lab draws  Moderate protein calorie malnutrition Plan Cont dysphagia 3 diet w/ clear liq  Macrocytic anemia Plan Trending cbc Transfuse trigger for   Baseline necrotic myositis> at this point it's hard to determine if he has had any sort of recurrence of this as his physical deconditioning seems mostly in keeping with critical illness myopathy.  Wife indicates he only took prednisone, he wasn't taking methotrexate Plan Holding MTX and pred F/u out pt     Consults  Infectious disease Speech therapy     Discharge Exam: Blood Pressure 97/65   Pulse  92   Temperature 98.3 F (36.8 C) (Oral)   Respiration (Abnormal) 8   Height _0  (1.803 m)   Weight 81.5 kg   Oxygen Saturation 92%   Body Mass Index 25.06 kg/m   General looks great no distress hent NCAT trach occlusive dressing is intact. Excellent and strong phonation pulm clear Card rrr abd  soft Ext warm and dry still has paresthesia of both feet Neuro intact   Labs at discharge   Lab Results  Component Value Date   CREATININE 0.64 09/28/2020   BUN 16 09/28/2020   NA 137 09/28/2020   K 4.3 09/28/2020   CL 102 09/28/2020   CO2 27 09/28/2020   Lab Results  Component Value Date   WBC 9.8 09/28/2020   HGB 10.5 (L) 09/28/2020   HCT 32.4 (L) 09/28/2020   MCV 93.4 09/28/2020   PLT 292 09/28/2020   Lab Results  Component Value Date   ALT 55 (H) 09/14/2020   AST 35 09/14/2020   ALKPHOS 161 (H) 09/14/2020   BILITOT 0.6 09/14/2020   Lab Results  Component Value Date   INR 1.1 09/14/2020   INR 1.2 08/28/2020    Current radiological studies    MR Lumbar Spine W Wo Contrast  Result Date: 09/28/2020 CLINICAL DATA:  Low back pain, lower extremity neuropathy EXAM: MRI LUMBAR SPINE WITHOUT AND WITH CONTRAST TECHNIQUE: Multiplanar and multiecho pulse sequences of the lumbar spine were obtained without and with intravenous contrast. CONTRAST:  23m GADAVIST GADOBUTROL 1 MMOL/ML IV SOLN COMPARISON:  None. FINDINGS: Segmentation:  Standard. Alignment:  No significant listhesis. Vertebrae: Mild degenerative endplate irregularity. Mild degenerative endplate marrow edema at L2-L3. Decreased T1 marrow signal may reflect anemia. Conus medullaris and cauda equina: Conus extends to the L1 level. Conus and cauda equina appear normal. No abnormal intrathecal enhancement. Paraspinal and other soft tissues: Posterior paraspinal muscle fatty atrophy. Otherwise unremarkable. Disc levels: Multilevel disc space narrowing, greatest at L5-S1. L1-L2: Shallow right paracentral disc protrusion. No canal or foraminal stenosis. L2-L3:  Mild disc bulge.  No canal or foraminal stenosis. L3-L4:  Minor disc bulge.  No canal or foraminal stenosis. L4-L5:  Minor disc bulge.  No canal or foraminal stenosis. L5-S1: Mild disc bulge with endplate osteophytic ridging. No canal or foraminal stenosis. IMPRESSION: Mild  degenerative changes without significant stenosis. Electronically Signed   By: PMacy MisM.D.   On: 09/28/2020 14:53    Disposition:    Discharge [place]: [place]       Discharge Instructions    Diet - low sodium heart healthy   Complete by: As directed    Increase activity slowly   Complete by: As directed    No wound care   Complete by: As directed       Allergies as of 09/29/2020    Allergen Reactions Comments   Vicodin [hydrocodone-acetaminophen] Nausea And Vomiting       Medication List    Stop taking these medications   Buprenorphine HCl-Naloxone HCl 8-2 MG Film Replaced by: buprenorphine-naloxone 8-2 mg Subl SL tablet   testosterone cypionate 200 MG/ML injection Commonly known as: DEPOTESTOSTERONE CYPIONATE     Take these medications   acetaminophen 325 MG tablet Commonly known as: TYLENOL Take 2 tablets (650 mg total) by mouth every 6 (six) hours as needed for mild pain, fever or headache.   albuterol (2.5 MG/3ML) 0.083% nebulizer solution Commonly known as: PROVENTIL Take 3 mLs (2.5 mg total) by nebulization  every 6 (six) hours as needed for wheezing or shortness of breath.   apixaban 5 MG Tabs tablet Commonly known as: ELIQUIS Take 1 tablet (5 mg total) by mouth 2 (two) times daily.   bethanechol 10 MG tablet Commonly known as: URECHOLINE Take 1 tablet (10 mg total) by mouth 3 (three) times daily.   buprenorphine-naloxone 8-2 mg Subl SL tablet Commonly known as: SUBOXONE Place 1 tablet under the tongue 2 (two) times daily. Replaces: Buprenorphine HCl-Naloxone HCl 8-2 MG Film   capsicum 0.075 % topical cream Commonly known as: ZOSTRIX Apply topically 4 (four) times daily.   chlorhexidine 0.12 % solution Commonly known as: PERIDEX 15 mLs by Mouth Rinse route 2 (two) times daily.   clonazePAM 1 MG tablet Commonly known as: KLONOPIN Take 1 tablet (1 mg total) by mouth 2 (two) times daily.    clotrimazole-betamethasone cream Commonly known as: LOTRISONE Apply topically 2 (two) times daily.   docusate sodium 100 MG capsule Commonly known as: COLACE Take 1 capsule (100 mg total) by mouth 2 (two) times daily.   feeding supplement Liqd Take 237 mLs by mouth 3 (three) times daily between meals.   folic acid 1 MG tablet Commonly known as: FOLVITE Take 1 tablet (1 mg total) by mouth daily. Start taking on: September 30, 2020   furosemide 40 MG tablet Commonly known as: LASIX Take 1 tablet (40 mg total) by mouth daily. Start taking on: September 30, 2020   gabapentin 100 MG capsule Commonly known as: NEURONTIN Take 1 capsule (100 mg total) by mouth 3 (three) times daily.   ibuprofen 400 MG tablet Commonly known as: ADVIL Take 1 tablet (400 mg total) by mouth every 6 (six) hours as needed for fever or cramping (If tylenol ineffective, pain in lower extremities).   lidocaine 5 % Commonly known as: LIDODERM Place 2 patches onto the skin daily. Remove & Discard patch within 12 hours or as directed by MD   linezolid 600 MG tablet Commonly known as: ZYVOX Take 1 tablet (600 mg total) by mouth every 12 (twelve) hours.   lip balm ointment Apply topically as needed for lip care.   loperamide 2 MG capsule Commonly known as: IMODIUM Take 1 capsule (2 mg total) by mouth as needed for diarrhea or loose stools.   metoprolol tartrate 25 MG tablet Commonly known as: LOPRESSOR Take 0.5 tablets (12.5 mg total) by mouth 2 (two) times daily.   mouth rinse Liqd solution 15 mLs by Mouth Rinse route 2 times daily at 12 noon and 4 pm.   multivitamin with minerals Tabs tablet Take 1 tablet by mouth daily. Start taking on: September 30, 2020   nystatin powder Commonly known as: MYCOSTATIN/NYSTOP Apply topically 2 (two) times daily.   QUEtiapine 100 MG tablet Commonly known as: SEROQUEL Take 1 tablet (100 mg total) by mouth at bedtime.   QUEtiapine 50 MG tablet Commonly known as:  SEROQUEL Take 1 tablet (50 mg total) by mouth daily. Start taking on: September 30, 2020   sodium chloride 0.9 % infusion Inject 250 mLs into the vein continuous.   thiamine 100 MG tablet Take 1 tablet (100 mg total) by mouth daily. Start taking on: September 30, 2020   traMADol 50 MG tablet Commonly known as: ULTRAM Take 2 tablets (100 mg total) by mouth every 6 (six) hours as needed (neuropathic pain in lower extremities).        Follow-up appointment   To be determined  Discharge Condition:  good  Physician Statement:   The Patient was personally examined, the discharge assessment and plan has been personally reviewed and I agree with ACNP Babcock's assessment and plan. 32 min minutes of time have been dedicated to discharge assessment, planning and discharge instructions.   Signed: Clementeen Graham 09/29/2020, 11:13 AM

## 2020-09-29 ENCOUNTER — Encounter (HOSPITAL_COMMUNITY): Payer: Self-pay | Admitting: Family Medicine

## 2020-09-29 ENCOUNTER — Encounter (HOSPITAL_COMMUNITY): Payer: Self-pay | Admitting: Physical Medicine & Rehabilitation

## 2020-09-29 ENCOUNTER — Inpatient Hospital Stay (HOSPITAL_COMMUNITY)
Admission: RE | Admit: 2020-09-29 | Discharge: 2020-10-06 | DRG: 945 | Disposition: A | Discharge status: Home Health | Payer: Medicare HMO | Source: Intra-hospital | Attending: Physical Medicine & Rehabilitation | Admitting: Physical Medicine & Rehabilitation

## 2020-09-29 DIAGNOSIS — G7289 Other specified myopathies: Secondary | ICD-10-CM

## 2020-09-29 DIAGNOSIS — F1010 Alcohol abuse, uncomplicated: Secondary | ICD-10-CM | POA: Diagnosis not present

## 2020-09-29 DIAGNOSIS — R1310 Dysphagia, unspecified: Secondary | ICD-10-CM | POA: Diagnosis present

## 2020-09-29 DIAGNOSIS — M609 Myositis, unspecified: Secondary | ICD-10-CM | POA: Diagnosis present

## 2020-09-29 DIAGNOSIS — I480 Paroxysmal atrial fibrillation: Secondary | ICD-10-CM | POA: Diagnosis present

## 2020-09-29 DIAGNOSIS — R7401 Elevation of levels of liver transaminase levels: Secondary | ICD-10-CM

## 2020-09-29 DIAGNOSIS — I712 Thoracic aortic aneurysm, without rupture, unspecified: Secondary | ICD-10-CM

## 2020-09-29 DIAGNOSIS — Z79899 Other long term (current) drug therapy: Secondary | ICD-10-CM

## 2020-09-29 DIAGNOSIS — I10 Essential (primary) hypertension: Secondary | ICD-10-CM

## 2020-09-29 DIAGNOSIS — Z930 Tracheostomy status: Secondary | ICD-10-CM | POA: Diagnosis not present

## 2020-09-29 DIAGNOSIS — M792 Neuralgia and neuritis, unspecified: Secondary | ICD-10-CM | POA: Diagnosis not present

## 2020-09-29 DIAGNOSIS — R5381 Other malaise: Secondary | ICD-10-CM | POA: Diagnosis not present

## 2020-09-29 DIAGNOSIS — G9341 Metabolic encephalopathy: Secondary | ICD-10-CM | POA: Diagnosis not present

## 2020-09-29 DIAGNOSIS — Z87891 Personal history of nicotine dependence: Secondary | ICD-10-CM

## 2020-09-29 DIAGNOSIS — J15212 Pneumonia due to Methicillin resistant Staphylococcus aureus: Secondary | ICD-10-CM | POA: Diagnosis present

## 2020-09-29 DIAGNOSIS — G894 Chronic pain syndrome: Secondary | ICD-10-CM

## 2020-09-29 DIAGNOSIS — R4587 Impulsiveness: Secondary | ICD-10-CM | POA: Diagnosis present

## 2020-09-29 DIAGNOSIS — G629 Polyneuropathy, unspecified: Secondary | ICD-10-CM | POA: Diagnosis present

## 2020-09-29 DIAGNOSIS — Z8701 Personal history of pneumonia (recurrent): Secondary | ICD-10-CM

## 2020-09-29 DIAGNOSIS — R0902 Hypoxemia: Secondary | ICD-10-CM | POA: Diagnosis present

## 2020-09-29 DIAGNOSIS — E871 Hypo-osmolality and hyponatremia: Secondary | ICD-10-CM | POA: Diagnosis not present

## 2020-09-29 DIAGNOSIS — J850 Gangrene and necrosis of lung: Secondary | ICD-10-CM

## 2020-09-29 DIAGNOSIS — Z7901 Long term (current) use of anticoagulants: Secondary | ICD-10-CM | POA: Diagnosis not present

## 2020-09-29 DIAGNOSIS — D638 Anemia in other chronic diseases classified elsewhere: Secondary | ICD-10-CM | POA: Diagnosis not present

## 2020-09-29 HISTORY — DX: Cannabis use, unspecified, uncomplicated: F12.90

## 2020-09-29 LAB — FUNGAL ORGANISM REFLEX

## 2020-09-29 LAB — FUNGUS CULTURE WITH STAIN

## 2020-09-29 LAB — FUNGUS CULTURE RESULT

## 2020-09-29 MED ORDER — QUETIAPINE FUMARATE 100 MG PO TABS: 100.0000 mg | ORAL_TABLET | Freq: Every day | ORAL | Status: DC

## 2020-09-29 MED ORDER — HYDROCERIN EX CREA
TOPICAL_CREAM | Freq: Every day | CUTANEOUS | Status: DC
  Filled 2020-09-29: qty 113

## 2020-09-29 MED ORDER — CAPSAICIN 0.075 % EX CREA
TOPICAL_CREAM | Freq: Four times a day (QID) | CUTANEOUS | Status: DC
  Administered 2020-09-29: 1 via TOPICAL
  Filled 2020-09-29 (×2): qty 60

## 2020-09-29 MED ORDER — LINEZOLID 600 MG PO TABS: 600.0000 mg | ORAL_TABLET | Freq: Two times a day (BID) | ORAL | Status: DC

## 2020-09-29 MED ORDER — NYSTATIN 100000 UNIT/GM EX POWD: Freq: Two times a day (BID) | CUTANEOUS | 0 refills | Status: DC

## 2020-09-29 MED ORDER — HYDROCERIN EX CREA
TOPICAL_CREAM | Freq: Two times a day (BID) | CUTANEOUS | Status: DC
  Administered 2020-09-29: 1 via TOPICAL
  Filled 2020-09-29: qty 113

## 2020-09-29 MED ORDER — PROCHLORPERAZINE EDISYLATE 10 MG/2ML IJ SOLN: 5.0000 mg | Freq: Four times a day (QID) | INTRAMUSCULAR | Status: DC | PRN

## 2020-09-29 MED ORDER — CLONAZEPAM 1 MG PO TABS: 1.0000 mg | ORAL_TABLET | Freq: Two times a day (BID) | ORAL | 0 refills | Status: DC

## 2020-09-29 MED ORDER — ACETAMINOPHEN 325 MG PO TABS
325.0000 mg | ORAL_TABLET | ORAL | Status: DC | PRN
  Administered 2020-09-29 – 2020-10-05 (×5): 650 mg via ORAL
  Filled 2020-09-29 (×5): qty 2

## 2020-09-29 MED ORDER — FOLIC ACID 1 MG PO TABS
1.0000 mg | ORAL_TABLET | Freq: Every day | ORAL | Status: DC
  Administered 2020-09-30 – 2020-10-06 (×7): 1 mg via ORAL
  Filled 2020-09-29 (×7): qty 1

## 2020-09-29 MED ORDER — CHLORHEXIDINE GLUCONATE CLOTH 2 % EX PADS
6.0000 | MEDICATED_PAD | Freq: Every day | CUTANEOUS | Status: DC
Start: 2020-09-30 — End: 2020-09-30

## 2020-09-29 MED ORDER — NYSTATIN 100000 UNIT/GM EX POWD
Freq: Two times a day (BID) | CUTANEOUS | Status: DC
  Filled 2020-09-29: qty 15

## 2020-09-29 MED ORDER — FUROSEMIDE 40 MG PO TABS
40.0000 mg | ORAL_TABLET | Freq: Every day | ORAL | Status: DC
  Administered 2020-09-30 – 2020-10-06 (×7): 40 mg via ORAL
  Filled 2020-09-29 (×7): qty 1

## 2020-09-29 MED ORDER — LIP MEDEX EX OINT
TOPICAL_OINTMENT | CUTANEOUS | Status: DC | PRN
  Filled 2020-09-29: qty 7

## 2020-09-29 MED ORDER — TRAZODONE HCL 50 MG PO TABS
25.0000 mg | ORAL_TABLET | Freq: Every evening | ORAL | Status: DC | PRN
  Administered 2020-09-29 – 2020-10-05 (×6): 50 mg via ORAL
  Filled 2020-09-29 (×6): qty 1

## 2020-09-29 MED ORDER — LOPERAMIDE HCL 2 MG PO CAPS: 2.0000 mg | ORAL_CAPSULE | ORAL | Status: DC | PRN

## 2020-09-29 MED ORDER — ADULT MULTIVITAMIN W/MINERALS CH: 1.0000 | ORAL_TABLET | Freq: Every day | ORAL | Status: DC

## 2020-09-29 MED ORDER — ADULT MULTIVITAMIN W/MINERALS CH
1.0000 | ORAL_TABLET | Freq: Every day | ORAL | Status: DC
  Administered 2020-09-30 – 2020-10-06 (×7): 1 via ORAL
  Filled 2020-09-29 (×7): qty 1

## 2020-09-29 MED ORDER — CLOTRIMAZOLE 1 % EX CREA
TOPICAL_CREAM | Freq: Two times a day (BID) | CUTANEOUS | Status: DC
  Administered 2020-09-29: 1 via TOPICAL
  Filled 2020-09-29: qty 15

## 2020-09-29 MED ORDER — IBUPROFEN 400 MG PO TABS: 400.0000 mg | ORAL_TABLET | Freq: Four times a day (QID) | ORAL | 0 refills | Status: DC | PRN

## 2020-09-29 MED ORDER — LOPERAMIDE HCL 2 MG PO CAPS: 2.0000 mg | ORAL_CAPSULE | ORAL | 0 refills | Status: DC | PRN

## 2020-09-29 MED ORDER — ALBUTEROL SULFATE (2.5 MG/3ML) 0.083% IN NEBU: 2.5000 mg | INHALATION_SOLUTION | Freq: Four times a day (QID) | RESPIRATORY_TRACT | Status: DC | PRN

## 2020-09-29 MED ORDER — CAPSAICIN 0.075 % EX CREA: TOPICAL_CREAM | Freq: Four times a day (QID) | CUTANEOUS | 0 refills | Status: DC

## 2020-09-29 MED ORDER — ACETAMINOPHEN 325 MG PO TABS: 650.0000 mg | ORAL_TABLET | Freq: Four times a day (QID) | ORAL | Status: DC | PRN

## 2020-09-29 MED ORDER — APIXABAN 5 MG PO TABS: 5.0000 mg | ORAL_TABLET | Freq: Two times a day (BID) | ORAL | Status: DC

## 2020-09-29 MED ORDER — CLONAZEPAM 0.5 MG PO TABS
1.0000 mg | ORAL_TABLET | Freq: Two times a day (BID) | ORAL | Status: DC
  Administered 2020-09-29 – 2020-10-05 (×12): 1 mg via ORAL
  Filled 2020-09-29 (×12): qty 2

## 2020-09-29 MED ORDER — QUETIAPINE FUMARATE 50 MG PO TABS
100.0000 mg | ORAL_TABLET | Freq: Every day | ORAL | Status: DC
  Administered 2020-09-29 – 2020-10-03 (×5): 100 mg via ORAL
  Filled 2020-09-29 (×4): qty 2

## 2020-09-29 MED ORDER — POLYETHYLENE GLYCOL 3350 17 G PO PACK: 17.0000 g | PACK | Freq: Every day | ORAL | Status: DC | PRN

## 2020-09-29 MED ORDER — TRAMADOL HCL 50 MG PO TABS: 100.0000 mg | ORAL_TABLET | Freq: Four times a day (QID) | ORAL | Status: DC | PRN

## 2020-09-29 MED ORDER — BUPRENORPHINE HCL-NALOXONE HCL 8-2 MG SL SUBL
1.0000 | SUBLINGUAL_TABLET | Freq: Two times a day (BID) | SUBLINGUAL | Status: DC
  Administered 2020-09-29 – 2020-10-06 (×14): 1 via SUBLINGUAL
  Filled 2020-09-29 (×15): qty 1

## 2020-09-29 MED ORDER — ORAL CARE MOUTH RINSE: 15.0000 mL | Freq: Two times a day (BID) | OROMUCOSAL | 0 refills | Status: DC

## 2020-09-29 MED ORDER — LIDOCAINE 5 % EX PTCH: 2.0000 | MEDICATED_PATCH | CUTANEOUS | Status: DC

## 2020-09-29 MED ORDER — LINEZOLID 600 MG PO TABS
600.0000 mg | ORAL_TABLET | Freq: Two times a day (BID) | ORAL | Status: DC
  Administered 2020-09-29 – 2020-10-06 (×14): 600 mg via ORAL
  Filled 2020-09-29 (×14): qty 1

## 2020-09-29 MED ORDER — TRAMADOL HCL 50 MG PO TABS
50.0000 mg | ORAL_TABLET | Freq: Four times a day (QID) | ORAL | Status: DC | PRN
  Administered 2020-09-30 – 2020-10-05 (×11): 100 mg via ORAL
  Filled 2020-09-29 (×12): qty 2

## 2020-09-29 MED ORDER — LIP MEDEX EX OINT: TOPICAL_OINTMENT | CUTANEOUS | 0 refills | Status: DC | PRN

## 2020-09-29 MED ORDER — QUETIAPINE FUMARATE 50 MG PO TABS: 50.0000 mg | ORAL_TABLET | Freq: Every day | ORAL | Status: DC

## 2020-09-29 MED ORDER — LIDOCAINE 5 % EX PTCH: 2.0000 | MEDICATED_PATCH | CUTANEOUS | 0 refills | Status: DC

## 2020-09-29 MED ORDER — DOCUSATE SODIUM 100 MG PO CAPS
100.0000 mg | ORAL_CAPSULE | Freq: Two times a day (BID) | ORAL | Status: DC
  Administered 2020-09-29 – 2020-10-06 (×14): 100 mg via ORAL
  Filled 2020-09-29 (×14): qty 1

## 2020-09-29 MED ORDER — BETHANECHOL CHLORIDE 10 MG PO TABS
10.0000 mg | ORAL_TABLET | Freq: Three times a day (TID) | ORAL | Status: DC
  Administered 2020-09-29 – 2020-10-01 (×6): 10 mg via ORAL
  Filled 2020-09-29 (×6): qty 1

## 2020-09-29 MED ORDER — QUETIAPINE FUMARATE 50 MG PO TABS
50.0000 mg | ORAL_TABLET | Freq: Every day | ORAL | Status: DC
  Administered 2020-09-30 – 2020-10-06 (×7): 50 mg via ORAL
  Filled 2020-09-29 (×7): qty 1

## 2020-09-29 MED ORDER — FOLIC ACID 1 MG PO TABS: 1.0000 mg | ORAL_TABLET | Freq: Every day | ORAL | Status: DC

## 2020-09-29 MED ORDER — ENSURE ENLIVE PO LIQD: 237.0000 mL | Freq: Three times a day (TID) | ORAL | 12 refills | Status: DC

## 2020-09-29 MED ORDER — PROCHLORPERAZINE 25 MG RE SUPP: 12.5000 mg | Freq: Four times a day (QID) | RECTAL | Status: DC | PRN

## 2020-09-29 MED ORDER — INFLUENZA VAC SPLIT QUAD 0.5 ML IM SUSY: 0.5000 mL | PREFILLED_SYRINGE | INTRAMUSCULAR | Status: DC

## 2020-09-29 MED ORDER — FUROSEMIDE 40 MG PO TABS: 40.0000 mg | ORAL_TABLET | Freq: Every day | ORAL | Status: DC

## 2020-09-29 MED ORDER — THIAMINE HCL 100 MG PO TABS
100.0000 mg | ORAL_TABLET | Freq: Every day | ORAL | Status: DC
  Administered 2020-09-30 – 2020-10-06 (×7): 100 mg via ORAL
  Filled 2020-09-29 (×7): qty 1

## 2020-09-29 MED ORDER — BISACODYL 10 MG RE SUPP: 10.0000 mg | Freq: Every day | RECTAL | Status: DC | PRN

## 2020-09-29 MED ORDER — ENSURE ENLIVE PO LIQD
237.0000 mL | Freq: Three times a day (TID) | ORAL | Status: DC
  Administered 2020-09-29 – 2020-10-06 (×19): 237 mL via ORAL

## 2020-09-29 MED ORDER — CHLORHEXIDINE GLUCONATE 0.12 % MT SOLN
15.0000 mL | Freq: Two times a day (BID) | OROMUCOSAL | Status: DC
  Administered 2020-09-29 – 2020-10-06 (×9): 15 mL via OROMUCOSAL
  Filled 2020-09-29 (×13): qty 15

## 2020-09-29 MED ORDER — CLOTRIMAZOLE-BETAMETHASONE 1-0.05 % EX CREA: TOPICAL_CREAM | Freq: Two times a day (BID) | CUTANEOUS | 0 refills | Status: DC

## 2020-09-29 MED ORDER — CHLORHEXIDINE GLUCONATE 0.12 % MT SOLN: 15.0000 mL | Freq: Two times a day (BID) | OROMUCOSAL | 0 refills | Status: DC

## 2020-09-29 MED ORDER — ALBUTEROL SULFATE (2.5 MG/3ML) 0.083% IN NEBU: 2.5000 mg | INHALATION_SOLUTION | Freq: Four times a day (QID) | RESPIRATORY_TRACT | 12 refills | Status: DC | PRN

## 2020-09-29 MED ORDER — FLEET ENEMA 7-19 GM/118ML RE ENEM: 1.0000 | ENEMA | Freq: Once | RECTAL | Status: DC | PRN

## 2020-09-29 MED ORDER — BETHANECHOL CHLORIDE 10 MG PO TABS: 10.0000 mg | ORAL_TABLET | Freq: Three times a day (TID) | ORAL | Status: DC

## 2020-09-29 MED ORDER — SODIUM CHLORIDE 0.9 % IV SOLN: 250.0000 mL | INTRAVENOUS | 0 refills | Status: DC

## 2020-09-29 MED ORDER — METOPROLOL TARTRATE 25 MG PO TABS: 12.5000 mg | ORAL_TABLET | Freq: Two times a day (BID) | ORAL | Status: DC

## 2020-09-29 MED ORDER — APIXABAN 5 MG PO TABS
5.0000 mg | ORAL_TABLET | Freq: Two times a day (BID) | ORAL | Status: DC
  Administered 2020-09-29 – 2020-10-06 (×14): 5 mg via ORAL
  Filled 2020-09-29 (×14): qty 1

## 2020-09-29 MED ORDER — GABAPENTIN 300 MG PO CAPS
300.0000 mg | ORAL_CAPSULE | Freq: Three times a day (TID) | ORAL | Status: DC
  Administered 2020-09-29 – 2020-10-03 (×11): 300 mg via ORAL
  Filled 2020-09-29 (×11): qty 1

## 2020-09-29 MED ORDER — METOPROLOL TARTRATE 12.5 MG HALF TABLET
12.5000 mg | ORAL_TABLET | Freq: Two times a day (BID) | ORAL | Status: DC
  Administered 2020-09-29 – 2020-10-06 (×14): 12.5 mg via ORAL
  Filled 2020-09-29 (×14): qty 1

## 2020-09-29 MED ORDER — GUAIFENESIN-DM 100-10 MG/5ML PO SYRP: 5.0000 mL | ORAL_SOLUTION | Freq: Four times a day (QID) | ORAL | Status: DC | PRN

## 2020-09-29 MED ORDER — DOCUSATE SODIUM 100 MG PO CAPS: 100.0000 mg | ORAL_CAPSULE | Freq: Two times a day (BID) | ORAL | 0 refills | Status: DC

## 2020-09-29 MED ORDER — ALUM & MAG HYDROXIDE-SIMETH 200-200-20 MG/5ML PO SUSP: 30.0000 mL | ORAL | Status: DC | PRN

## 2020-09-29 MED ORDER — DIPHENHYDRAMINE HCL 12.5 MG/5ML PO ELIX
12.5000 mg | ORAL_SOLUTION | Freq: Four times a day (QID) | ORAL | Status: DC | PRN
  Administered 2020-09-29: 25 mg via ORAL
  Filled 2020-09-29: qty 10

## 2020-09-29 MED ORDER — GABAPENTIN 100 MG PO CAPS: 100.0000 mg | ORAL_CAPSULE | Freq: Three times a day (TID) | ORAL | Status: DC

## 2020-09-29 MED ORDER — PROCHLORPERAZINE MALEATE 5 MG PO TABS: 5.0000 mg | ORAL_TABLET | Freq: Four times a day (QID) | ORAL | Status: DC | PRN

## 2020-09-29 MED ORDER — THIAMINE HCL 100 MG PO TABS: 100.0000 mg | ORAL_TABLET | Freq: Every day | ORAL | Status: DC

## 2020-09-29 MED ORDER — ORAL CARE MOUTH RINSE
15.0000 mL | Freq: Two times a day (BID) | OROMUCOSAL | Status: DC
  Administered 2020-09-30 – 2020-10-03 (×8): 15 mL via OROMUCOSAL

## 2020-09-29 NOTE — Progress Notes (Signed)
PMR Admission Coordinator Pre-Admission Assessment   Patient: Joseph Ayala is an 55 y.o., male MRN: [id number] DOB: [birth year] Height: _0  (180.3 cm) Weight: 81.5 kg   Insurance Information HMO: yes    PPO:      PCP:      IPA:      80/20:      OTHER:  PRIMARY: [employer] Medicare      Policy#: [id number]      Subscriber: pt CM Name: Wyona Almas (x [id number])      Phone#: [phone]     Fax#: [phone] Pre-Cert#: [id number] auth for CIR provided by Dot with [facility] Medicare with updates due to [place] at fax listed above on 3/30      Employer:  Benefits:  Phone #: [phone]     Name: n/a Eff. Date: 07/09/20     Deduct: $0      Out of Pocket Max: $3900 ($0 met)      Life Max: n/a CIR: $295/day for days 1-6      SNF: 100% Outpatient:      Co-Pay: $20/visit Home Health: 100%      Co-Pay:  DME: 80%     Co-Pay: 20% Providers:  SECONDARY:       Policy#:      Phone#:    Development worker, community:       Phone#:    The Actuary for patients in Inpatient Rehabilitation Facilities with attached Privacy Act [place] Records was provided and verbally reviewed with: Family   Emergency Contact Information         Contact Information     Name Relation Home Work Mobile    [place] Spouse [phone]    ZAHI, PLASKETT Daughter [phone]   [phone]         Current Medical History  Patient Admitting Diagnosis: debility 2/2 necrotizing PNA and myositis   History of Present Illness: Pt is a 55 y/o male with PMH of aortic aneurysm, HTN, ETOH abuse, and opioid use (currently on suboxone managed by Dr. Evette Doffing).  Recent dx of myositis.  [facility] with c/o chest pain, cough, and SOB with diagnosis of respiratory failure 2/2 MRSA/CAP and aspiration.  Pt was intubated on 2/20 and trach placed on 3/9.  Pt with prolonged and complicated hospital course requiring vasopressors for BP management,  chest tube for pleural effusion/empyema (removed 3/2), anasarca requiring massive diuresis, mucus plugging, ketamine infusions for pain control and related agitation, and worsening PNA.  Pt improved with ketamine infusions and tolerated trach collar trials with ultimate decannulation on 3/22.  Restarted suboxone on 3/22 (44m SL BID per primary pain MD Dr. VEvette Doffing.  Therapy evaluations were completed and pt was recommended for CIR.     [facility] admission coordinator and physician.   Past Medical History      Past Medical History:  Diagnosis Date   Degenerative disc disease, lumbar     Dental crown present     ETOH abuse     History of kidney stones     Hypertension      states under control with meds., has been on med. x 1 yr. - is currently out of med., to see PCP 02/06/2018   Muscle atrophy 01/2018   Muscle weakness 01/2018   Necrotizing myopathy     Opioid use disorder     Thoracic aortic aneurysm (TAA) [place]. seen [place] 08/2020.      Family History   family history includes Varicose Veins in his brother.   Prior Rehab/Hospitalizations Has the patient had prior rehab or hospitalizations prior to admission? No   Has the patient had major surgery during 100 days prior to admission? Yes              Current Medications   Current Facility-Administered Medications:    0.9 %  sodium chloride infusion, 250 mL, Intravenous, Continuous, Melissa Montane, MD, Last Rate: 10 mL/hr at 09/16/20 0036, Restarted at 09/16/20 0036   0.9 %  sodium chloride infusion, , Intravenous, Continuous, Reino Bellis B, NP, Stopped at 09/24/20 1626   acetaminophen (TYLENOL) tablet 650 mg, 650 mg, Oral, Q6H PRN, Lenis Noon, RPH, 650 mg at 09/29/20 0806   albuterol (PROVENTIL) (2.5 MG/3ML) 0.083% nebulizer solution 2.5 mg, 2.5 mg, Nebulization, Q6H PRN, Melissa Montane, MD, 2.5 mg at 09/17/20 0801   apixaban (ELIQUIS)  tablet 5 mg, 5 mg, Oral, BID, Erick Colace, NP, 5 mg at 09/29/20 1042   bethanechol (URECHOLINE) tablet 10 mg, 10 mg, Oral, TID, Erick Colace, NP, 10 mg at 09/29/20 1043   buprenorphine-naloxone (SUBOXONE) 8-2 mg per SL tablet 1 tablet, 1 tablet, Sublingual, BID, Erick Colace, NP, 1 tablet at 09/29/20 1043   capsicum (ZOSTRIX) 0.075 % cream, , Topical, QID, Acquanetta Chain, DO, Given at 09/29/20 1049   chlorhexidine (PERIDEX) 0.12 % solution 15 mL, 15 mL, Mouth Rinse, BID, McQuaid, Douglas B, MD, 15 mL at 09/29/20 1043   Chlorhexidine Gluconate Cloth 2 % PADS 6 each, 6 each, Topical, Daily, Melissa Montane, MD, 6 each at 09/28/20 1203   clonazePAM (KLONOPIN) tablet 1 mg, 1 mg, Oral, BID, Erick Colace, NP, 1 mg at 09/29/20 1042   clotrimazole-betamethasone (LOTRISONE) cream, , Topical, BID, Lane Hacker L, DO, 1 application at 32/91/91 1049   docusate sodium (COLACE) capsule 100 mg, 100 mg, Oral, BID, Lenis Noon, RPH, 100 mg at 09/29/20 1042   feeding supplement (ENSURE ENLIVE / ENSURE PLUS) liquid 237 mL, 237 mL, Oral, TID BM, McQuaid, Douglas B, MD, 237 mL at 66/06/00 4599   folic acid (FOLVITE) tablet 1 mg, 1 mg, Oral, Daily, Salvadore Dom E, NP, 1 mg at 09/29/20 1042   furosemide (LASIX) tablet 40 mg, 40 mg, Oral, Daily, Lenis Noon, RPH, 40 mg at 09/29/20 1043   gabapentin (NEURONTIN) capsule 100 mg, 100 mg, Oral, TID, Simonne Maffucci B, MD, 100 mg at 09/29/20 1043   hydrocerin (EUCERIN) cream, , Topical, Daily, Tanda Rockers, MD, Given at 09/28/20 1037   ibuprofen (ADVIL) tablet 400 mg, 400 mg, Oral, Q6H PRN, Lane Hacker L, DO, 400 mg at 09/27/20 0427   lidocaine (LIDODERM) 5 % 2 patch, 2 patch, Transdermal, Q24H, Lane Hacker L, DO, 2 patch at 09/28/20 0138   linezolid (ZYVOX) tablet 600 mg, 600 mg, Oral, Q12H, Carlyle Basques, MD, 600 mg at 09/29/20 1044   lip balm (CARMEX) ointment, , Topical, PRN, Melissa Montane, MD, 1 application  at 77/41/42 0254   loperamide (IMODIUM) capsule 2 mg, 2 mg, Oral, PRN, Erick Colace, NP, 2 mg at 09/26/20 1242   MEDLINE mouth rinse, 15 mL, Mouth Rinse, q12n4p, McQuaid, Douglas B, MD, 15 mL at 09/28/20 1203   metoprolol tartrate (LOPRESSOR) tablet 12.5 mg, 12.5 mg, Oral, BID, McQuaid, Douglas B, MD, 12.5 mg at 09/28/20 1035   multivitamin with minerals tablet 1 tablet,  1 tablet, Oral, Daily, Lenis Noon, [place], 1 tablet at 09/29/20 1042   nystatin (MYCOSTATIN/NYSTOP) topical powder, , Topical, BID, Melissa Montane, MD, Given at 09/29/20 1057   QUEtiapine (SEROQUEL) tablet 100 mg, 100 mg, Oral, QHS, Erick Colace, NP, 100 mg at 09/28/20 2136   QUEtiapine (SEROQUEL) tablet 50 mg, 50 mg, Oral, Daily, Erick Colace, NP, 50 mg at 09/29/20 1042   sodium chloride flush (NS) 0.9 % injection 10-40 mL, 10-40 mL, Intracatheter, Q12H, Melissa Montane, MD, 10 mL at 09/29/20 1044   thiamine tablet 100 mg, 100 mg, Oral, Daily, Lenis Noon, RPH, 100 mg at 09/29/20 1043   traMADol (ULTRAM) tablet 100 mg, 100 mg, Oral, Q6H PRN, Acquanetta Chain, DO, 100 mg at 09/28/20 9563   Patients Current Diet:     Diet Order                      DIET DYS 3 Room service appropriate? Yes; Fluid consistency: Thin  Diet effective now                      Precautions / Restrictions Precautions Precautions: Fall Precaution Comments: rectal tube, impulsive/cognitive impaired, ketamine drip Restrictions Weight Bearing Restrictions: No    Has the patient had 2 or more falls or a fall with injury in the past year? No   Prior Activity Level Community (5-7x/wk): was independent prior to admit, driving, on disability with his myositis, no DME at baseline, was toe walking 2/2 myositis.   Prior Functional Level Self Care: Did the patient need help bathing, dressing, using the toilet or eating? Independent   Indoor Mobility: Did the patient need assistance with walking from room to room (with or  without device)? Independent   Stairs: Did the patient need assistance with internal or external stairs (with or without device)? Independent   Functional Cognition: Did the patient need help planning regular tasks such as shopping or remembering to take medications? Independent   Home Assistive Devices / Equipment Home Assistive Devices/Equipment: Brace (specify type) (back brace for myositis) Home Equipment: None   Prior Device Use: Indicate devices/aids used by the patient prior to current illness, exacerbation or injury? None of the above   Current Functional Level Cognition   Overall Cognitive Status: Impaired/Different from baseline Difficult to assess due to: Tracheostomy Orientation Level: Oriented X4 Safety/Judgement: Decreased awareness of safety,Decreased awareness of deficits General Comments: pt was impulsive/agitated when unable to negotiate obstacles in hallway with RW picking up RW in the air to get around obstacles x2 and requiring verbal cues from therapist to avoid 3rd obstacle.    Extremity Assessment (includes Sensation/Coordination)   Upper Extremity Assessment: RUE deficits/detail,LUE deficits/detail RUE Deficits / Details: AAROM for shoulder flexion, grossly able to flex elbow and extend, grossly able to flex and extend wrist, able to open and close fingers. Shoulder 3-/5 strength, bicep 3/5, tricep 3=/5 tricep, wrist 3/5, hand 3/5 RUE Coordination: decreased fine motor,decreased gross motor LUE Deficits / Details: AAROM for shoulder flexion, grossly able to flex elbow and extend, grossly able to flex and extend wrist, able to open and close fingers. Shoulder 2+/5 strength, elbow 3/5, wrist 3/5, hand 3/5 LUE Coordination: decreased fine motor,decreased gross motor  Lower Extremity Assessment: Defer to PT evaluation RLE Deficits / Details: pt able to perform B AROM dorsi/plantar flexion. Pt with limited AROM knee flexion, therapist able to achieve full ROM with  AAROM. Pt was unable to  clear Rt LE from bed with SLR . Pt unable to perform AROM hip ABD, able to achieve passively. Grossly 2+/5 or less for LE strength. LLE Deficits / Details: pt able to perform AROM knee flexion on L with limited ROM. Pt was able to perform limited SLR and displayed no eccentric lowering control. Grossly 2+/5 or less for LE strength.     ADLs   Overall ADL's : Needs assistance/impaired Eating/Feeding: NPO Grooming: Set up,Wash/dry face,Sitting,Maximal assistance Upper Body Bathing: Maximal assistance,Sitting Lower Body Bathing: Total assistance Upper Body Dressing : Maximal assistance,Sitting Lower Body Dressing: Minimal assistance,Bed level Lower Body Dressing Details (indicate cue type and reason): min assist to don socks in bed. Needed min asist to don right sock (assist to get over 5th digit) (min assist for task today/not overall LB dressing). Toilet Transfer: Maximal assistance,Stand-pivot,+2 for physical assistance,+2 for safety/equipment,BSC Toileting- Clothing Manipulation and Hygiene: Total assistance Toileting - Clothing Manipulation Details (indicate cue type and reason): rectal tube and catheter     Mobility   Overal bed mobility: Needs Assistance Bed Mobility: Supine to Sit Rolling: Min assist Sidelying to sit: Supervision,HOB elevated Supine to sit: +2 for physical assistance,+2 for safety/equipment,HOB elevated,Mod assist General bed mobility comments: pt sittting in recliner at sink shaving with wife and RN upon entrance     Transfers   Overall transfer level: Needs assistance Equipment used: Rolling walker (2 wheeled) Transfers: Sit to/from Stand Sit to Stand: Min assist,Mod assist Stand pivot transfers: Min guard General transfer comment: Pt unable to rise from chair without UE use for power up requiring mod assist. Demonstrates improved ability to power up to stand with use of B UEs with MIN assist for safety/stabilty from recliner.      Ambulation / Gait / Stairs / Wheelchair Mobility   Ambulation/Gait Ambulation/Gait assistance: Min assist,Mod assist Gait Distance (Feet): 400 Feet (120, 130, 150) Assistive device: Rolling walker (2 wheeled) Gait Pattern/deviations: Step-to pattern,Decreased step length - right,Decreased step length - left,Shuffle,Staggering right,Staggering left,Drifts right/left,Trunk flexed (knees flexed in stance) General Gait Details: MIN-MOD assist for safety/stability with cues for RW management and to maintain safe proximity to RW. Pt demonstrates increased hip ABD/ER with steps, staggering/drifting Lt/Rt with increased severity when fatigued. pt required standing rest break x3 and close chair follow. Pt requires MOD assist for steadying with turns. Cues required to "tuck butt" to improve upright posture. Pt's HR max 121 bpm and SpO2 lowest of 84%, potentially unreliable reading as monitor was on pt's finger and he was using RW for stabilty. Pt became diaphoretic following ambulation, BP found to be 114/77, pt and wife report that happens at home as well. Pt provided with cool wash cloths. 02 sat 94% on RA at EOS. RN intermittenly present throughout session. Gait velocity: decr- fair     Posture / Balance Dynamic Sitting Balance Sitting balance - Comments: Able to maintain upright posture on bed when attempting to get up unsafely and to maintain sitting edge of bed though min guard provided. Poor sitting tolerance. Balance Overall balance assessment: Needs assistance Sitting-balance support: Feet supported Sitting balance-Leahy Scale: Fair Sitting balance - Comments: Able to maintain upright posture on bed when attempting to get up unsafely and to maintain sitting edge of bed though min guard provided. Poor sitting tolerance. Standing balance support: Bilateral upper extremity supported,During functional activity Standing balance-Leahy Scale: Poor Standing balance comment: Pt was able to stand for about  ~13mn to wash up at sink with MIN assist-MIN guard and recliner behind  him. Pt required assist from therapist to wash his back. Pt with knee flexion in standing and required single UE support on sink. Pt leaning on sink when fatigued. Pt remains HIGH fall risk.     Special needs/care consideration Skin healing trach incision    Previous Home Environment (from acute therapy documentation) Living Arrangements: Spouse/significant other Available Help at Discharge: Family Type of Home: House Home Layout: One level Home Access: Stairs to enter Entrance Stairs-Rails: None Entrance Stairs-Number of Steps: 2 Home Care Services: No Additional Comments: pt's wife is a Marine scientist at [employer] and is available to assist. All home living information provided by wife who was present during session.   Discharge Living Setting Plans for Discharge Living Setting: Patient's home Type of Home at Discharge: House Discharge Home Layout: One level Discharge Home Access: Stairs to enter Entrance Stairs-Rails: None Entrance Stairs-Number of Steps: 1 (through Gregory and 1 through the back) Discharge Bathroom Shower/Tub: Tub/shower unit,Walk-in shower Discharge Bathroom Toilet: Standard Does the patient have any problems obtaining your medications?: No   Social/Family/Support Systems Patient Roles: Spouse Anticipated Caregiver: spouse, Ginger Pizzi Anticipated Caregiver's Contact Information: [phone] Ability/Limitations of Caregiver: n/a, she is an Therapist, sports at [employer] and taking FMLA (has 7 weeks left) Caregiver Availability: 24/7 Discharge Plan Discussed with Primary Caregiver: Yes Is Caregiver In Agreement with Plan?: Yes Does Caregiver/Family have Issues with Lodging/Transportation while Pt is in Rehab?: No   Goals Patient/Family Goal for Rehab: PT/OT supervision to mod I, SLP mod I Expected length of stay: 14-16 days Pt/Family Agrees to Admission and willing to participate: Yes Program Orientation Provided &  Reviewed with Pt/Caregiver Including Roles  & Responsibilities: Yes  Barriers to Discharge: Insurance for SNF coverage   Decrease burden of Care through IP rehab admission: n/a Possible need for SNF placement upon discharge: Not anticipat4ed   Patient Condition: I have reviewed medical records from [facility], spoken with CM, and spouse. I discussed via phone for inpatient rehabilitation assessment.  Patient will benefit from ongoing PT, OT and SLP, can actively participate in 3 hours of therapy a day 5 days of the week, and can make measurable gains during the admission.  Patient will also benefit from the coordinated team approach during an Inpatient Acute Rehabilitation admission.  The patient will receive intensive therapy as well as Rehabilitation physician, nursing, social worker, and care management interventions.  Due to safety, skin/wound care, disease management, medication administration, pain management and patient education the patient requires 24 hour a day rehabilitation nursing.  The patient is currently mod +2 with mobility and basic ADLs.  Discharge setting and therapy post discharge at home is anticipated.  Patient has agreed to participate in the Acute Inpatient Rehabilitation Program and will admit today.   Preadmission Screen Completed By:  Michel Santee, PT, DPT 09/29/2020 11:06 AM ______________________________________________________________________   Discussed status with Dr. Dagoberto Ligas on 09/29/20  at 11:06 AM  and received approval for admission today.   Admission Coordinator:  Michel Santee, PT, DPT time 11:06 AM Sudie Grumbling 09/29/20     Assessment/Plan: Diagnosis: 1. Does the need for close, 24 hr/day Medical supervision in concert with the patient's rehab needs make it unreasonable for this patient to be served in a less intensive setting? Yes 2. Co-Morbidities requiring supervision/potential complications: necrotizing pneumonia s/p trach/decannulated 3/22- s/p  chest tube, on suboxone for pain; myositis, HTN, EtOh abuse, impaired cognition/swallow 3. Due to bladder management, bowel management, safety, skin/wound care, disease management, medication administration, pain management  and patient education, does the patient require 24 hr/day rehab nursing? Yes 4. Does the patient require coordinated care of a physician, rehab nurse, PT, OT, and SLP to address physical and functional deficits in the context of the above medical diagnosis(es)? Yes Addressing deficits in the following areas: balance, endurance, locomotion, strength, transferring, bathing, dressing, feeding, grooming, toileting, cognition, swallowing and psychosocial support 5. Can the patient actively participate in an intensive therapy program of at least 3 hrs of therapy 5 days a week? Yes 6. The potential for patient to make measurable gains while on inpatient rehab is good 7. Anticipated functional outcomes upon discharge from inpatient rehab: modified independent and supervision PT, modified independent and supervision OT, modified independent SLP 8. Estimated rehab length of stay to reach the above functional goals is: 14-16 days 9. Anticipated discharge destination: Home 10. Overall Rehab/Functional Prognosis: good     MD Signature:

## 2020-09-29 NOTE — H&P (Signed)
Physical Medicine and Rehabilitation Admission H&P    Chief Complaint  Patient presents with  . Debility    HPI: Joseph Ayala. Ayala is a 55 year old male with history of DDD, ETOH/substance abuse, necrotizing myopathy treated with steroids and recent addition of MTX, Chronic pain- on suboxone;  who was admitted to [facility] on 08/28/20 with one week history of cough and chest pain due to necrotizing MRSA PNA. CT chest also showed incidental 4.6 cm thoracic aneurysm (semi-annual follow up recommended) and negative for PE. He required pressors with vent support, chest tube to manage large right pleural effusion and developed progressive airspace disease with cavitation of RLL/RML, fluid overload bouts of agitation requiring addition of ketamine to dilaudid/propofol as well as librium taper.  BLE dopplers negative for DVT. CT chest   Dr. Bjorn Pippin was consulted for input on A fib with elevated trops and global hypokinesis with EF 35-40%. He felt that patient with demand ischemia and CM would be stress v/s ETOH related. PAF converted to NSR on amiodarone with recommendations to transition to oral AC when able. He required tracheostomy by Dr. Jearld Fenton was weaned to ATC, tolerated capping and was decannulation by 03/22.  He did developed increase in air space disease with persistent fever and  progressive PNA fel to be due to "high burden of disease" per Dr. Luciana Axe and antibiotics changed to Ceftaroline on 03/08 -->Linezolid today X 2 weeks per ID (can switch to doxycycline 100 mg bid if unable to tolerate latter)   He is tolerating D3, thins with aspiration precautions. Palliative care has been following for input on pain and symptom management--ketamine was weaned off as patient with reports of  neuropathy BLE and Suboxone resumed with addition of tramadol and lidocaine patches for pain management. MRI lumbar spine done for work up and showed mild degenerative changes without stenosis.  Therapy ongoing and patient  noted to be limited by balance deficits with decreased posture as well as tachycardia and hypoxia--sats down to mid 80's with activity. CIR recommended due to functional decline.   Pt reports it feels like feet have frostbite and has resulting nerve pain- pain is bad all day, but gets worse ~ 9pm daily all night- it's "horrific". Lidocaine patches didn't help at all- ketamine infusions were helpful- this is a new problem- suboxone helps a little, but not a lot. Capsacin lasts "5 minutes".  Didn't like MTX for myositis- bad side effects.  Breeds labradoodles- just had 4+ litters- has 25 puppies <5 weeks at home.  Is disabled.   LBM this AM No constipation- voiding OK  Review of Systems  Constitutional: Negative for chills and fever.  HENT: Negative for hearing loss and tinnitus.   Eyes: Negative for blurred vision and double vision.  Respiratory: Negative for cough and shortness of breath.   Cardiovascular: Negative for chest pain and palpitations.  Gastrointestinal: Negative for abdominal pain, constipation, heartburn and nausea.  Genitourinary: Negative for dysuria and urgency.  Musculoskeletal: Positive for back pain, myalgias and neck pain.  Skin: Negative for rash.  Neurological: Positive for tingling, sensory change (icy/numbness bilateral feet to ankles.) and weakness. Negative for dizziness and headaches.       Nerve pain  Psychiatric/Behavioral: The patient is not nervous/anxious and does not have insomnia.   All other systems reviewed and are negative.    Past Medical History:  Diagnosis Date  . Degenerative disc disease, lumbar   . Dental crown present   . ETOH abuse   .  History of kidney stones   . Hypertension    states under control with meds., has been on med. x 1 yr. - is currently out of med., to see PCP 02/06/2018  . Muscle atrophy 01/2018  . Muscle weakness 01/2018  . Necrotizing myopathy   . Opioid use disorder   . Thoracic aortic aneurysm (TAA) (HCC)    a.  seen on CT 08/2020.    Past Surgical History:  Procedure Laterality Date  . ENDOVENOUS ABLATION SAPHENOUS VEIN W/ LASER Right 12-29-2015   endovenous laser ablation right greater saphenous vein, stab phlebectomy > 20 incisions right leg, sclerotherapy right leg by Gretta Began MD    . MINOR MUSCLE BIOPSY Right 02/11/2018   Procedure: RECTUS FEMORIS MUSCLE BIOPSY;  Surgeon: Darnell Level, MD;  Location: [facility];  Service: General;  Laterality: Right;  . MUSCLE BIOPSY Right 02/11/2018   Procedure: DELTOID MUSCLE BIOPSY;  Surgeon: Darnell Level, MD;  Location: [facility];  Service: General;  Laterality: Right;  . NASAL FRACTURE SURGERY    . TRACHEOSTOMY TUBE PLACEMENT N/A 09/14/2020   Procedure: TRACHEOSTOMY;  Surgeon: Suzanna Obey, MD;  Location: [facility];  Service: ENT;  Laterality: N/A;    Family History  Problem Relation Age of Onset  . Varicose Veins Brother   . 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 Disabled for 3 years due to myositis--breeds dogs. He was independent PTA. Per reports that he has never smoked. He has quit using smokeless tobacco. He reports current alcohol use--12 pack beer and "liquer drinks". He reports using 3-4 bunts of marijuana daily. .   Allergies  Allergen Reactions  . Vicodin [Hydrocodone-Acetaminophen] Nausea And Vomiting   Medications Prior to Admission  Medication Sig Dispense Refill  . acetaminophen (TYLENOL) 325 MG tablet Take 2 tablets (650 mg total) by mouth every 6 (six) hours as needed for mild pain, fever or headache.    . albuterol (PROVENTIL) (2.5 MG/3ML) 0.083% nebulizer solution Take 3 mLs (2.5 mg total) by nebulization every 6 (six) hours as needed for wheezing or shortness of breath. 75 mL 12  . apixaban (ELIQUIS) 5 MG TABS tablet Take 1 tablet (5 mg total) by mouth 2 (two) times daily. 60 tablet   . bethanechol (URECHOLINE) 10 MG tablet Take 1 tablet (10 mg total) by mouth 3 (three)  times daily.    . buprenorphine-naloxone (SUBOXONE) 8-2 mg SUBL SL tablet Place 1 tablet under the tongue 2 (two) times daily. 30 tablet   . capsicum (ZOSTRIX) 0.075 % topical cream Apply topically 4 (four) times daily. 28.3 g 0  . chlorhexidine (PERIDEX) 0.12 % solution 15 mLs by Mouth Rinse route 2 (two) times daily. 120 mL 0  . clonazePAM (KLONOPIN) 1 MG tablet Take 1 tablet (1 mg total) by mouth 2 (two) times daily. 30 tablet 0  . clotrimazole-betamethasone (LOTRISONE) cream Apply topically 2 (two) times daily. 30 g 0  . docusate sodium (COLACE) 100 MG capsule Take 1 capsule (100 mg total) by mouth 2 (two) times daily. 10 capsule 0  . feeding supplement (ENSURE ENLIVE / ENSURE PLUS) LIQD Take 237 mLs by mouth 3 (three) times daily between meals. 237 mL 12  . [START ON 09/30/2020] folic acid (FOLVITE) 1 MG tablet Take 1 tablet (1 mg total) by mouth daily.    Melene Muller ON 09/30/2020] furosemide (LASIX) 40 MG tablet Take 1 tablet (40 mg total) by mouth daily.  30 tablet   . gabapentin (NEURONTIN) 100 MG capsule Take 1 capsule (100 mg total) by mouth 3 (three) times daily.    Marland Kitchen. ibuprofen (ADVIL) 400 MG tablet Take 1 tablet (400 mg total) by mouth every 6 (six) hours as needed for fever or cramping (If tylenol ineffective, pain in lower extremities). 30 tablet 0  . lidocaine (LIDODERM) 5 % Place 2 patches onto the skin daily. Remove & Discard patch within 12 hours or as directed by MD 30 patch 0  . linezolid (ZYVOX) 600 MG tablet Take 1 tablet (600 mg total) by mouth every 12 (twelve) hours.    . lip balm (CARMEX) ointment Apply topically as needed for lip care. 7 g 0  . loperamide (IMODIUM) 2 MG capsule Take 1 capsule (2 mg total) by mouth as needed for diarrhea or loose stools. 30 capsule 0  . metoprolol tartrate (LOPRESSOR) 25 MG tablet Take 0.5 tablets (12.5 mg total) by mouth 2 (two) times daily.    . Mouthwashes (MOUTH RINSE) LIQD solution 15 mLs by Mouth Rinse route 2 times daily at 12 noon and 4  pm.  0  . [START ON 09/30/2020] Multiple Vitamin (MULTIVITAMIN WITH MINERALS) TABS tablet Take 1 tablet by mouth daily.    Marland Kitchen. nystatin (MYCOSTATIN/NYSTOP) powder Apply topically 2 (two) times daily. 15 g 0  . QUEtiapine (SEROQUEL) 100 MG tablet Take 1 tablet (100 mg total) by mouth at bedtime.    Melene Muller. [START ON 09/30/2020] QUEtiapine (SEROQUEL) 50 MG tablet Take 1 tablet (50 mg total) by mouth daily.    . sodium chloride 0.9 % infusion Inject 250 mLs into the vein continuous.  0  . [START ON 09/30/2020] thiamine 100 MG tablet Take 1 tablet (100 mg total) by mouth daily.    . traMADol (ULTRAM) 50 MG tablet Take 2 tablets (100 mg total) by mouth every 6 (six) hours as needed (neuropathic pain in lower extremities). 30 tablet     Drug Regimen Review  Drug regimen was reviewed and remains appropriate with no significant issues identified  Home: Home Living Family/patient expects to be discharged to:: Private residence Living Arrangements: Spouse/significant other,Children   Functional History:    Functional Status:  Mobility:          ADL:    Cognition: Cognition Orientation Level: Oriented X4     Blood pressure 110/82, pulse 87, temperature 97.9 F (36.6 C), resp. rate 16, SpO2 94 %. Physical Exam Vitals and nursing note reviewed. Exam conducted with a chaperone present.  Constitutional:      Comments: Alert, fidgeting and restless.  Air loss noted from prior trach site with conversation.  Pt was walking around room with RW_ explained cannot do so until evaluated by PT and OT- unhappy, but calmed down- wife in room, did sit at EOB- took RW away, jiggling legs constantly (said to get feeling back), NAD  HENT:     Head: Normocephalic and atraumatic.     Comments: Smile equal    Right Ear: External ear normal.     Left Ear: External ear normal.     Nose: Nose normal. No congestion.     Mouth/Throat:     Mouth: Mucous membranes are moist.     Pharynx: Oropharynx is clear. No  oropharyngeal exudate.  Eyes:     General:        Right eye: No discharge.        Left eye: No discharge.     Extraocular Movements:  Extraocular movements intact.     Conjunctiva/sclera: Conjunctivae normal.  Neck:     Comments: Prior trach with pinpoint hole. Moderate amount of thick yellow drainage on dressing. Air loss noted with phonation.  Cardiovascular:     Rate and Rhythm: Normal rate and regular rhythm.     Heart sounds: Normal heart sounds. No murmur heard. No gallop.   Pulmonary:     Comments: Good air movement B/L- somewhat decreased at bases B/L- a little coarse, but no W/R/R Abdominal:     Comments: Soft, NT, ND, (+)BS - hypoactive  Musculoskeletal:     Cervical back: Normal range of motion and neck supple.     Comments: 5-/5 in UEs- deltoids, biceps, triceps, WE, grip and finger abd B/L LEs- HF 4+/5, KE/KF 5-/5, DF 4-/5 and PF 5-/5 B/L  Skin:    General: Skin is warm and dry.     Comments: Dry flaky skin on hands, knees feet and upper arms--per wife peeled after swelling.  Trach site leaking air- is ~ 1/2 way healed R inner buttock- has scab from previous blister- skin also peeling- is a little red around crease of buttocks- a little excoriated  Neurological:     Comments: Decreased sensation from L1 to S2 B/L - to light touch Ox2-3- but impulsive, irritable  Psychiatric:     Comments: Impulsive, irritable, but calmed down     Results for orders placed or performed during the hospital encounter of 08/27/20 (from the past 48 hour(s))  Basic metabolic panel     Status: None   Collection Time: 09/28/20  6:13 AM  Result Value Ref Range   Sodium 137 135 - 145 mmol/L   Potassium 4.3 3.5 - 5.1 mmol/L   Chloride 102 98 - 111 mmol/L   CO2 27 22 - 32 mmol/L   Glucose, Bld 94 70 - 99 mg/dL    Comment: Glucose reference range applies only to samples taken after fasting for at least 8 hours.   BUN 16 6 - 20 mg/dL   Creatinine, Ser 7.82 0.61 - 1.24 mg/dL   Calcium 42.3  8.9 - 53.6 mg/dL   GFR, Estimated >14 >43 mL/min    Comment: (NOTE) Calculated using the CKD-EPI Creatinine Equation (2021)    Anion gap 8 5 - 15    Comment: Performed at [facility], [address] [place], [place] [address]  CBC     Status: Abnormal   Collection Time: 09/28/20  6:13 AM  Result Value Ref Range   WBC 9.8 4.0 - 10.5 K/uL   RBC 3.47 (L) 4.22 - 5.81 MIL/uL   Hemoglobin 10.5 (L) 13.0 - 17.0 g/dL   HCT 86.7 (L) 61.9 - 50.9 %   MCV 93.4 80.0 - 100.0 fL   MCH 30.3 26.0 - 34.0 pg   MCHC 32.4 30.0 - 36.0 g/dL   RDW 32.6 (H) 71.2 - 45.8 %   Platelets 292 150 - 400 K/uL   nRBC 0.0 0.0 - 0.2 %    Comment: Performed at [facility], [address], [place], [place] [address]   MR Lumbar Spine W Wo Contrast  Result Date: 09/28/2020 CLINICAL DATA:  Low back pain, lower extremity neuropathy EXAM: MRI LUMBAR SPINE WITHOUT AND WITH CONTRAST TECHNIQUE: Multiplanar and multiecho pulse sequences of the lumbar spine were obtained without and with intravenous contrast. CONTRAST:  28mL GADAVIST GADOBUTROL 1 MMOL/ML IV SOLN COMPARISON:  None. FINDINGS: Segmentation:  Standard. Alignment:  No significant  listhesis. Vertebrae: Mild degenerative endplate irregularity. Mild degenerative endplate marrow edema at L2-L3. Decreased T1 marrow signal may reflect anemia. Conus medullaris and cauda equina: Conus extends to the L1 level. Conus and cauda equina appear normal. No abnormal intrathecal enhancement. Paraspinal and other soft tissues: Posterior paraspinal muscle fatty atrophy. Otherwise unremarkable. Disc levels: Multilevel disc space narrowing, greatest at L5-S1. L1-L2: Shallow right paracentral disc protrusion. No canal or foraminal stenosis. L2-L3:  Mild disc bulge.  No canal or foraminal stenosis. L3-L4:  Minor disc bulge.  No canal or foraminal stenosis. L4-L5:  Minor disc bulge.  No canal or foraminal stenosis. L5-S1: Mild disc bulge with endplate  osteophytic ridging. No canal or foraminal stenosis. IMPRESSION: Mild degenerative changes without significant stenosis. Electronically Signed   By: Guadlupe Spanish M.D.   On: 09/28/2020 14:53       Medical Problem List and Plan: 1.  Debility secondary to necrotizing pneumonia, multiple ventilation episodes/trach and anasarca  -patient may shower  -ELOS/Goals: 7-1`0 days - mod I to supervision-  2.  PAF/Antithrombotics: -DVT/anticoagulation:  Pharmaceutical: Other (comment)--Eliquis.   -antiplatelet therapy: N/A 3. Pain Management: Continue Suboxone bid  --increase gabapentin to 300 mg tid for neuropathy  --continue lidocaine patches and capsaicin cream.   -of note, pt would prefer if multiple Nerve pain agents were used, of note.  4. Mood: LCSW to follow for evaluation and support.   -antipsychotic agents: N/A 5. Neuropsych: This patient is? capable of making decisions on his own behalf. 6. Skin/Wound Care: Routine pressure relief measures.  7. Fluids/Electrolytes/Nutrition: Monitor I/O---check lytes in am. 8. MRSA Necrotizing PNA: To continue Zyvox X 2 more weeks. 9. 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 MTX did NOT work due to side effects 10. Delirium: Encephalopathy resolving--continue Seroquel bid and wean as able as patient gets used to CIR.  11. PAF/Tachycardia: Monitor HR tid--continue metoprolol and Eliquis bid. 12. Alcohol abuse: Has completed librium taper. Continue Seroquel and Klonopin bid.  --continue Folic acid/thiamine  --Patient plans of quitting  13. HTN: Monitor BP tid--on lasix and low dose metoprolol.  14. Anemia of chronic disease?: At baseline of 10. Monitor for signs of bleeding.             --Vitamin B12- 914. MMA- 224 15. Abnormal LFTS: Resolving-->recheck in am.   [name]- PA-C 09/29/2020   I have personally performed a face to face diagnostic evaluation of this patient and formulated the key components of the plan.  Additionally, I have  personally reviewed laboratory data, imaging studies, as well as relevant notes and concur with the physician assistant's documentation above.   The patient's status has not changed from the original H&P.  Any changes in documentation from the acute care chart have been noted above.     Genice Rouge, MD 09/29/2020

## 2020-09-29 NOTE — Progress Notes (Signed)
Inpatient Rehab Admissions Coordinator:    I have insurance approval and a bed available for pt to admit to CIR today. Dr. Rhona Leavens in agreement.  Will let pt/family and TOC team know.   Estill Dooms, PT, DPT Admissions Coordinator [phone] 09/29/20  11:02 AM

## 2020-09-29 NOTE — Progress Notes (Signed)
Inpatient Rehabilitation Medication Review by a Pharmacist  A complete drug regimen review was completed for this patient to identify any potential clinically significant medication issues.  Clinically significant medication issues were identified:  yes   Type of Medication Issue Identified Description of Issue Urgent (address now) Non-Urgent (address on AM team rounds) Plan Plan Accepted by Provider? (Yes / No / Pending AM Rounds)  Drug Interaction(s) (clinically significant)       Duplicate Therapy       Allergy       No Medication Administration End Date       Incorrect Dose  Gabapentin 100mg  TID prior to rehab Urgent Verify with MD if he wants dose increased to 300mg  TID? Verified by PA that dose increase was intentional.  Additional Drug Therapy Needed       Other         Name of provider notified for urgent issues identified: Patel  Provider Method of Notification: Chat, then phone call   For non-urgent medication issues to be resolved on team rounds tomorrow morning a [facility] Secure Chat Handoff was sent to:    Pharmacist comments:   Time spent performing this drug regimen review (minutes):   Nhyla Nappi S. , PharmD, BCPS Clinical Staff Pharmacist [email or web address] 09/29/2020 2:33 PM

## 2020-09-29 NOTE — Progress Notes (Signed)
Inpatient Rehabilitation  Patient information reviewed and entered into eRehab system by Melissa M. Bowie, M.A., CCC/SLP, PPS Coordinator.  Information including medical coding, functional ability and quality indicators will be reviewed and updated through discharge.    

## 2020-09-29 NOTE — Progress Notes (Signed)
Passed by patient's room and noted he was standing with walker and exercising within room. Wife present. MD joined me at room and educated patient about the need to have staff present when ambulating. Patient stated he could not stay in that bed. Suggested he consider recliner. MD remained in room to assess patient. This nurse Reported to patient's primary nurse to increase safety assessments on this patient.

## 2020-09-29 NOTE — Progress Notes (Signed)
Patient arrived from [facility] via ambulance. Accompanied by his wife.

## 2020-09-29 NOTE — Progress Notes (Deleted)
Inpatient Rehabilitation Medication Review by a Pharmacist  A complete drug regimen review was completed for this patient to identify any potential clinically significant medication issues.  Clinically significant medication issues were identified:  yes   Type of Medication Issue Identified Description of Issue Urgent (address now) Non-Urgent (address on AM team rounds) Plan Plan Accepted by Provider? (Yes / No / Pending AM Rounds)  Drug Interaction(s) (clinically significant)       Duplicate Therapy       Allergy       No Medication Administration End Date       Incorrect Dose  Gabapentin 100mg  TID prior to rehab Urgent Verify with MD if he wants dose increased to 300mg  TID?   Additional Drug Therapy Needed       Other         Name of provider notified for urgent issues identified: Patel  Provider Method of Notification: Chat, then phone call   For non-urgent medication issues to be resolved on team rounds tomorrow morning a [facility] Secure Chat Handoff was sent to:    Pharmacist comments:   Time spent performing this drug regimen review (minutes):   Crystal S. , PharmD, BCPS Clinical Staff Pharmacist [email or web address] 09/29/2020 2:33 PM

## 2020-09-29 NOTE — IPOC Note (Signed)
Individualized overall Plan of Care (IPOC) Patient Details Name: Joseph Ayala MRN: [id number] DOB: March 27, 1966  Admitting Diagnosis: Debility  Hospital Problems: Principal Problem:   Debility Active Problems:   Chronic pain syndrome   Necrotizing pneumonia (HCC)   Acute metabolic encephalopathy   Nerve pain   Hyponatremia   Transaminitis   Anemia of chronic disease   Essential hypertension   PAF (paroxysmal atrial fibrillation) (HCC)     Functional Problem List: Nursing Behavior,Bladder,Bowel,Endurance,Medication Management,Nutrition,Pain,Safety,Skin Integrity  PT Balance,Endurance,Motor,Safety,Sensory  OT Balance,Cognition,Endurance,Motor,Pain,Safety,Sensory  SLP Behavior,Nutrition,Cognition  TR         Basic ADL's: OT Grooming,Bathing,Dressing,Toileting     Advanced  ADL's: OT       Transfers: PT Bed Mobility,Bed to Chair,Car,Furniture,Floor  OT Toilet,Tub/Shower     Locomotion: PT Ambulation,Stairs     Additional Impairments: OT None  SLP Social Cognition,Swallowing   Awareness  TR      Anticipated Outcomes Item Anticipated Outcome  Self Feeding S  Swallowing  Mod I   Basic self-care  S  Toileting  S   Bathroom Transfers S  Bowel/Bladder  Patient will be continent of bowel and bladder with min assist  Transfers  S to mod I  Locomotion  S ambulation  Communication     Cognition     Pain  [place] will be less than or equal to 4/10 with min assist  Safety/Judgment  patient will be free from falls/injury and displaying appropriate safety judgement with min assist   Therapy Plan: PT Intensity: Minimum of 1-2 x/day ,45 to 90 minutes PT Frequency: 5 out of 7 days PT Duration Estimated Length of Stay: 5-7 days OT Intensity: Minimum of 1-2 x/day, 45 to 90 minutes OT Frequency: 5 out of 7 days OT Duration/Estimated Length of Stay: 7-10 SLP Intensity: Minumum of 1-2 x/day, 30 to 90 minutes SLP Frequency: 3 to 5 out of 7 days SLP  Duration/Estimated Length of Stay: 7-10 days    Team Interventions: Nursing Interventions Patient/Family Education,Bladder Management,Bowel Management,Disease Management/Prevention,Pain Management,Medication Management,Skin Care/Wound Management  PT interventions Ambulation/gait training,DME/adaptive equipment instruction,Balance/vestibular training,Discharge planning,Stair training,UE/LE Strength taining/ROM,Wheelchair propulsion/positioning,Cognitive remediation/compensation,Disease management/prevention,Functional mobility training,Patient/family education,Therapeutic Exercise,UE/LE Coordination activities,Therapeutic Activities,Neuromuscular re-education  OT Interventions Balance/vestibular training,Discharge planning,Pain management,Self Care/advanced ADL retraining,Therapeutic Activities,UE/LE Coordination activities,Visual/perceptual remediation/compensation,Therapeutic Exercise,Skin care/wound managment,Patient/family education,Functional mobility training,Disease mangement/prevention,Cognitive remediation/compensation,Community Teaching laboratory technician re-education,Psychosocial support,Splinting/orthotics,UE/LE Strength taining/ROM,Wheelchair propulsion/positioning  SLP Interventions Cueing hierarchy,Dysphagia/aspiration precaution training,Functional tasks,Patient/family education  TR Interventions    SW/CM Interventions Discharge Planning,Psychosocial Support,Patient/Family Education   Barriers to Discharge MD  Medical stability and chronic pain  Nursing      PT      OT Decreased caregiver support,Lack of/limited family support unsure support after DC- wife RN likely on FMLA per pt  SLP Behavior Pt is very frustrated with being in the hospital and does not want to stay  SW Other (comments) Pt is wanting to DC home ASAP   Team Discharge Planning: Destination: PT-Home ,OT- Home , SLP-Home Projected Follow-up: PT-Outpatient PT, OT-  Home health  OT, SLP-24 hour supervision/assistance,None Projected Equipment Needs: PT-None recommended by PT, OT- 3 in 1 bedside comode,To be determined,Tub/shower bench, SLP-None recommended by SLP Equipment Details: PT-Has RW and w/c that was his dad's, OT-  Patient/family involved in discharge planning: PT- Patient,  OT-Patient, SLP-Patient,Family member/caregiver  MD ELOS: 6-9 days. Medical Rehab Prognosis:  Good Assessment: 55 year old male with history of DDD, ETOH/substance abuse, necrotizing myopathy treated with steroids and recent addition of MTX, Chronic pain- on suboxone;  who  was admitted to [facility] on 08/28/20 with one week history of cough and chest pain due to necrotizing MRSA PNA. CT chest also showed incidental 4.6 cm thoracic aneurysm (semi-annual follow up recommended) and negative for PE. He required pressors with vent support, chest tube to manage large right pleural effusion and developed progressive airspace disease with cavitation of RLL/RML, fluid overload bouts of agitation requiring addition of ketamine to dilaudid/propofol as well as librium taper.  BLE dopplers negative for DVT. Dr. Bjorn Pippin was consulted for input on A fib with elevated trops and global hypokinesis with EF 35-40%. He felt that patient with demand ischemia and CM would be stress v/s ETOH related. PAF converted to NSR on amiodarone with recommendations to transition to oral AC when able. He required tracheostomy by Dr. Jearld Fenton was weaned to ATC, tolerated capping and was decannulation by 03/22.  He did developed increase in air space disease with persistent fever and  progressive PNA fel to be due to "high burden of disease" per Dr. Luciana Axe and antibiotics changed to Ceftaroline on 03/08 -->Linezolid today X 2 weeks per ID (can switch to doxycycline 100 mg bid if unable to tolerate latter). He is tolerating D3, thins with aspiration precautions. Palliative care has been following for input on pain and symptom management--ketamine was  weaned off as patient with reports of  neuropathy BLE and Suboxone resumed with addition of tramadol and lidocaine patches for pain management. MRI lumbar spine done for work up and showed mild degenerative changes without stenosis.  Therapy ongoing and patient noted to be limited by balance deficits with decreased posture as well as tachycardia and hypoxia--sats down to mid 80's with activity. Will set goals for supervision/Mod I with PT/OT/SLP.  Due to the current state of emergency, patients may not be receiving their 3-hours of Medicare-mandated therapy.  See Team Conference Notes for weekly updates to the plan of care

## 2020-09-30 ENCOUNTER — Encounter (HOSPITAL_COMMUNITY): Payer: Self-pay | Admitting: Physical Medicine & Rehabilitation

## 2020-09-30 DIAGNOSIS — I10 Essential (primary) hypertension: Secondary | ICD-10-CM

## 2020-09-30 DIAGNOSIS — D638 Anemia in other chronic diseases classified elsewhere: Secondary | ICD-10-CM

## 2020-09-30 DIAGNOSIS — E871 Hypo-osmolality and hyponatremia: Secondary | ICD-10-CM

## 2020-09-30 DIAGNOSIS — R7401 Elevation of levels of liver transaminase levels: Secondary | ICD-10-CM

## 2020-09-30 DIAGNOSIS — I480 Paroxysmal atrial fibrillation: Secondary | ICD-10-CM

## 2020-09-30 LAB — CBC WITH DIFFERENTIAL/PLATELET
Abs Immature Granulocytes: 0.1 10*3/uL — ABNORMAL HIGH (ref 0.00–0.07)
Basophils Absolute: 0.1 10*3/uL (ref 0.0–0.1)
Basophils Relative: 1 %
Eosinophils Absolute: 0.3 10*3/uL (ref 0.0–0.5)
Eosinophils Relative: 4 %
HCT: 33.3 % — ABNORMAL LOW (ref 39.0–52.0)
Hemoglobin: 10.9 g/dL — ABNORMAL LOW (ref 13.0–17.0)
Immature Granulocytes: 1 %
Lymphocytes Relative: 22 %
Lymphs Abs: 1.9 10*3/uL (ref 0.7–4.0)
MCH: 29.9 pg (ref 26.0–34.0)
MCHC: 32.7 g/dL (ref 30.0–36.0)
MCV: 91.2 fL (ref 80.0–100.0)
Monocytes Absolute: 0.7 10*3/uL (ref 0.1–1.0)
Monocytes Relative: 8 %
Neutro Abs: 5.7 10*3/uL (ref 1.7–7.7)
Neutrophils Relative %: 64 %
Platelets: 267 10*3/uL (ref 150–400)
RBC: 3.65 MIL/uL — ABNORMAL LOW (ref 4.22–5.81)
RDW: 15.8 % — ABNORMAL HIGH (ref 11.5–15.5)
WBC: 8.9 10*3/uL (ref 4.0–10.5)
nRBC: 0 % (ref 0.0–0.2)

## 2020-09-30 LAB — COMPREHENSIVE METABOLIC PANEL
ALT: 68 U/L — ABNORMAL HIGH (ref 0–44)
AST: 104 U/L — ABNORMAL HIGH (ref 15–41)
Albumin: 3 g/dL — ABNORMAL LOW (ref 3.5–5.0)
Alkaline Phosphatase: 128 U/L — ABNORMAL HIGH (ref 38–126)
Anion gap: 9 (ref 5–15)
BUN: 14 mg/dL (ref 6–20)
CO2: 29 mmol/L (ref 22–32)
Calcium: 10.1 mg/dL (ref 8.9–10.3)
Chloride: 93 mmol/L — ABNORMAL LOW (ref 98–111)
Creatinine, Ser: 0.62 mg/dL (ref 0.61–1.24)
GFR, Estimated: >60 mL/min (ref >60)
Glucose, Bld: 92 mg/dL (ref 70–99)
Potassium: 4 mmol/L (ref 3.5–5.1)
Sodium: 131 mmol/L — ABNORMAL LOW (ref 135–145)
Total Bilirubin: 0.6 mg/dL (ref 0.3–1.2)
Total Protein: 6.5 g/dL (ref 6.5–8.1)

## 2020-09-30 NOTE — Progress Notes (Signed)
[facility] Individual Statement of Services  Patient Name:  Joseph Ayala  Date:  09/30/2020  Welcome to the [facility]patient [facility].  Our goal is to provide you with an individualized program based on your diagnosis and situation, designed to meet your specific needs.  With this comprehensive rehabilitation program, you will be expected to participate in at least 3 hours of rehabilitation therapies Monday-Friday, with modified therapy programming on the weekends.  Your rehabilitation program will include the following services:  Physical Therapy (PT), Occupational Therapy (OT), Speech Therapy (ST), 24 hour per day rehabilitation nursing, Neuropsychology, Care Coordinator, Rehabilitation Medicine, Nutrition Services and Pharmacy Services  Weekly team conferences will be held on Wednesday to discuss your progress.  Your Inpatient Rehabilitation Care Coordinator will talk with you frequently to get your input and to update you on team discussions.  Team conferences with you and your family in attendance may also be held.  Expected length of stay: 7-10 days  Overall anticipated outcome:supervision with cueing   Depending on your progress and recovery, your program may change. Your Inpatient Rehabilitation Care Coordinator will coordinate services and will keep you informed of any changes. Your Inpatient Rehabilitation Care Coordinator's name and contact numbers are listed  below.  The following services may also be recommended but are not provided by the Inpatient Rehabilitation Center:   Driving Evaluations  Home Health Rehabiltiation Services  Outpatient Rehabilitation Services    Arrangements will be made to provide these services after discharge if needed.  Arrangements include referral to agencies that provide these services.  Your insurance has been verified to be:  [employer] Your primary doctor is:  Erlinda Hong  Pertinent information will be  shared with your doctor and your insurance company.  Inpatient Rehabilitation Care Coordinator:  Dossie Der, [place] [phone] or Luna Glasgow  Information discussed with and copy given to patient by: Lucy Chris, 09/30/2020, 2:04 PM

## 2020-09-30 NOTE — Progress Notes (Signed)
[place] PHYSICAL MEDICINE & REHABILITATION PROGRESS NOTE  Subjective/Complaints: Patient seen sitting up, working with a previous plan.  He states he did not sleep well overnight due to bilateral knee pain.  He notes lidocaine patches exacerbated the pain.  ROS: Denies CP, SOB, N/V/D  Objective: Vital Signs: Blood pressure 118/72, pulse 94, temperature 98.3 F (36.8 C), temperature source Oral, resp. rate 18, SpO2 91 %. MR Lumbar Spine W Wo Contrast  Result Date: 09/28/2020 CLINICAL DATA:  Low back pain, lower extremity neuropathy EXAM: MRI LUMBAR SPINE WITHOUT AND WITH CONTRAST TECHNIQUE: Multiplanar and multiecho pulse sequences of the lumbar spine were obtained without and with intravenous contrast. CONTRAST:  31mL GADAVIST GADOBUTROL 1 MMOL/ML IV SOLN COMPARISON:  None. FINDINGS: Segmentation:  Standard. Alignment:  No significant listhesis. Vertebrae: Mild degenerative endplate irregularity. Mild degenerative endplate marrow edema at L2-L3. Decreased T1 marrow signal may reflect anemia. Conus medullaris and cauda equina: Conus extends to the L1 level. Conus and cauda equina appear normal. No abnormal intrathecal enhancement. Paraspinal and other soft tissues: Posterior paraspinal muscle fatty atrophy. Otherwise unremarkable. Disc levels: Multilevel disc space narrowing, greatest at L5-S1. L1-L2: Shallow right paracentral disc protrusion. No canal or foraminal stenosis. L2-L3:  Mild disc bulge.  No canal or foraminal stenosis. L3-L4:  Minor disc bulge.  No canal or foraminal stenosis. L4-L5:  Minor disc bulge.  No canal or foraminal stenosis. L5-S1: Mild disc bulge with endplate osteophytic ridging. No canal or foraminal stenosis. IMPRESSION: Mild degenerative changes without significant stenosis. Electronically Signed   By: Guadlupe Spanish M.D.   On: 09/28/2020 14:53   Recent Labs    09/28/20 0613 09/30/20 0516  WBC 9.8 8.9  HGB 10.5* 10.9*  HCT 32.4* 33.3*  PLT 292 267   Recent Labs     09/28/20 0613 09/30/20 0516  NA 137 131*  K 4.3 4.0  CL 102 93*  CO2 27 29  GLUCOSE 94 92  BUN 16 14  CREATININE 0.64 0.62  CALCIUM 10.0 10.1    Intake/Output Summary (Last 24 hours) at 09/30/2020 5409 Last data filed at 09/30/2020 0700 Gross per 24 hour  Intake 480 ml  Output 2650 ml  Net -2170 ml     Pressure Injury 09/07/20 Buttocks Right;Left Stage 2 -  Partial thickness loss of dermis presenting as a shallow open injury with a red, pink wound bed without slough. one stage 2 on each buttock (Active)  09/07/20 1744  Location: Buttocks  Location Orientation: Right;Left  Staging: Stage 2 -  Partial thickness loss of dermis presenting as a shallow open injury with a red, pink wound bed without slough.  Wound Description (Comments): one stage 2 on each buttock  Present on Admission: No    Physical Exam: BP 118/72   Pulse 94   Temp 98.3 F (36.8 C) (Oral)   Resp 18   SpO2 91%  Constitutional: No distress . Vital signs reviewed. HENT: Normocephalic.  Atraumatic. Neck: + Stoma Eyes: EOMI. No discharge. Cardiovascular: No JVD.  RRR. Respiratory: Normal effort.  No stridor.  Bilateral clear to auscultation. GI: Non-distended.  BS +. Skin: Warm and dry.  Intact. Psych: Normal mood.  Normal behavior. Musc: No edema in extremities.  No tenderness in extremities. Neuro: Alert Motor: Bilateral upper extremities: 5/5 proximal distal Bilateral lower extremities: 4+/5 proximal to distal Sensation diminished to light touch in bilateral lower extremities  Assessment/Plan: 1. Functional deficits which require 3+ hours per day of interdisciplinary therapy in a comprehensive inpatient rehab  setting.  Physiatrist is providing close team supervision and 24 hour management of active medical problems listed below.  Physiatrist and rehab team continue to assess barriers to discharge/monitor patient progress toward functional and medical goals   Care Tool:  Bathing               Bathing assist       Upper Body Dressing/Undressing Upper body dressing   What is the patient wearing?: Pull over shirt    Upper body assist      Lower Body Dressing/Undressing Lower body dressing      What is the patient wearing?: Underwear/pull up,Pants     Lower body assist       Toileting Toileting    Toileting assist       Transfers Chair/bed transfer  Transfers assist           Locomotion Ambulation   Ambulation assist              Walk 10 feet activity   Assist           Walk 50 feet activity   Assist           Walk 150 feet activity   Assist           Walk 10 feet on uneven surface  activity   Assist           Wheelchair     Assist               Wheelchair 50 feet with 2 turns activity    Assist            Wheelchair 150 feet activity     Assist           Medical Problem List and Plan: 1.  Debility secondary to necrotizing pneumonia, multiple ventilation episodes/trach and anasarca   Begin CIR evaluation  2.  PAF/Antithrombotics: -DVT/anticoagulation:  Pharmaceutical: Other (comment)--Eliquis.              -antiplatelet therapy: N/A 3. Pain Management: Continue Suboxone bid             Increased gabapentin to 300 mg tid for neuropathy             States lidocaine patches exacerbated pain  Continue capsaicin cream.   Trial of heat packs on 3/25 for bilateral feet 4. Mood: LCSW to follow for evaluation and support.              -antipsychotic agents: N/A 5. Neuropsych: This patient is? capable of making decisions on his own behalf. 6. Skin/Wound Care: Routine pressure relief measures.  7. Fluids/Electrolytes/Nutrition: Monitor I/Os 8. MRSA Necrotizing PNA: Continue Zyvox through 4/6 9. 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 MTX did NOT work due to side effects 10. Delirium: Encephalopathy resolving  Continue Seroquel bid at present, plan to wean 11.  PAF/Tachycardia: Monitor HR tid--continue metoprolol and Eliquis bid.  Moderate increased activity 12. Alcohol abuse: Has completed librium taper.   Continue Seroquel and Klonopin bid.             Continue Folic acid/thiamine 13. HTN: Monitor BP tid--on lasix and low dose metoprolol.   Monitor with increased mobility 14. Anemia of chronic disease?: At baseline of 10. Monitor for signs of bleeding.   Hemoglobin 10.9 on 3/25  Continue to monitor 15.  Transaminitis:  LFTs elevated on 3/25, labs ordered for Monday 16.  Hyponatremia  Sodium 131 on 3/25, labs ordered for  Monday   LOS: 1 days A FACE TO FACE EVALUATION WAS PERFORMED  Joseph Ayala 09/30/2020, 9:21 AM

## 2020-09-30 NOTE — Progress Notes (Addendum)
Patient ID: Joseph Ayala, male   DOB: March 24, 1966, 55 y.o.   MRN: [id number] Met with the patient and his wife to review role of the nurse CM and educational needs. Reviewed co-morbidities and current treatment; HTN, HLD, anemia, hypoalbuminemia, and vitamin deficiency. Discussed DASH diet, protein rich food items, high triglyceride meal suggestion and vit. B12/Folate rich dietary modifications for anemia. Patient and wife given handouts for referral. Reviewed collaboration with the SW to facilitate preparation for discharge. Patient is anxious to go home and requested exercises/activities he can do in the room after therapy sessions to keep from getting bored. Reviewed team conference schedule and neuropsych referral for coping.  Continue to follow along to discharge to address questions/educational needs.  Margarito Liner

## 2020-09-30 NOTE — Plan of Care (Signed)
  Problem: RH Balance Goal: LTG Patient will maintain dynamic sitting balance (PT) Description: LTG:  Patient will maintain dynamic sitting balance with assistance during mobility activities (PT) Flowsheets (Taken 09/30/2020 1721) LTG: Pt will maintain dynamic sitting balance during mobility activities with:: Independent Goal: LTG Patient will maintain dynamic standing balance (PT) Description: LTG:  Patient will maintain dynamic standing balance with assistance during mobility activities (PT) Flowsheets (Taken 09/30/2020 1721) LTG: Pt will maintain dynamic standing balance during mobility activities with:: Supervision/Verbal cueing   Problem: Sit to Stand Goal: LTG:  Patient will perform sit to stand with assistance level (PT) Description: LTG:  Patient will perform sit to stand with assistance level (PT) Flowsheets (Taken 09/30/2020 1721) LTG: PT will perform sit to stand in preparation for functional mobility with assistance level: Supervision/Verbal cueing   Problem: RH Car Transfers Goal: LTG Patient will perform car transfers with assist (PT) Description: LTG: Patient will perform car transfers with assistance (PT). Flowsheets (Taken 09/30/2020 1721) LTG: Pt will perform car transfers with assist:: Supervision/Verbal cueing   Problem: RH Furniture Transfers Goal: LTG Patient will perform furniture transfers w/assist (OT/PT) Description: LTG: Patient will perform furniture transfers  with assistance (OT/PT). Flowsheets (Taken 09/30/2020 1721) LTG: Pt will perform furniture transfers with assist:: Supervision/Verbal cueing   Problem: RH Floor Transfers Goal: LTG Patient will perform floor transfers w/assist (PT) Description: LTG: Patient will perform floor transfers with assistance (PT). Flowsheets (Taken 09/30/2020 1721) LTG: PT WILL PERFORM FLOOR TRANFERS  WITH  ASSIST:: Contact Guard/Touching assist   Problem: RH Ambulation Goal: LTG Patient will ambulate in controlled environment  (PT) Description: LTG: Patient will ambulate in a controlled environment, # of feet with assistance (PT). Flowsheets (Taken 09/30/2020 1721) LTG: Pt will ambulate in controlled environ  assist needed:: Supervision/Verbal cueing LTG: Ambulation distance in controlled environment: 200' w/ LRAD Goal: LTG Patient will ambulate in home environment (PT) Description: LTG: Patient will ambulate in home environment, # of feet with assistance (PT). Flowsheets (Taken 09/30/2020 1721) LTG: Pt will ambulate in home environ  assist needed:: Supervision/Verbal cueing LTG: Ambulation distance in home environment: 59' w/ LRAD   Problem: RH Stairs Goal: LTG Patient will ambulate up and down stairs w/assist (PT) Description: LTG: Patient will ambulate up and down # of stairs with assistance (PT) Flowsheets (Taken 09/30/2020 1721) LTG: Pt will ambulate up/down stairs assist needed:: Supervision/Verbal cueing LTG: Pt will  ambulate up and down number of stairs: 2 w/ LRAD  Sheran Lawless, PT

## 2020-09-30 NOTE — Evaluation (Signed)
Physical Therapy Assessment and Plan  Patient Details  Name: Joseph Ayala MRN: [id number] Date of Birth: 11-Mar-1966  PT Diagnosis: Abnormality of gait, Coordination disorder and Muscle weakness Rehab Potential: Good ELOS: 5-7 days   Today's Date: 09/30/2020 PT Individual Time:1015-1115  PT Individual Time Calculation (min): 60 min    Hospital Problem: Principal Problem:   Debility Active Problems:   Chronic pain syndrome   Necrotizing pneumonia (HCC)   Acute metabolic encephalopathy   Nerve pain   Hyponatremia   Transaminitis   Anemia of chronic disease   Essential hypertension   PAF (paroxysmal atrial fibrillation) (HCC)   Past Medical History:  Past Medical History:  Diagnosis Date  . Degenerative disc disease, lumbar   . Dental crown present   . ETOH abuse   . History of kidney stones   . Hypertension    states under control with meds., has been on med. x 1 yr. - is currently out of med., to see PCP 02/06/2018  . Marijuana use, continuous   . Muscle atrophy 01/2018  . Muscle weakness 01/2018  . Necrotizing myopathy   . Opioid use disorder   . Thoracic aortic aneurysm (TAA) ([place])    a. seen on CT 08/2020.   Past Surgical History:  Past Surgical History:  Procedure Laterality Date  . ENDOVENOUS ABLATION SAPHENOUS VEIN W/ LASER Right 12-29-2015   endovenous laser ablation right greater saphenous vein, stab phlebectomy > 20 incisions right leg, sclerotherapy right leg by Curt Jews MD    . MINOR MUSCLE BIOPSY Right 02/11/2018   Procedure: RECTUS FEMORIS MUSCLE BIOPSY;  Surgeon: Armandina Gemma, MD;  Location: [place];  Service: General;  Laterality: Right;  . MUSCLE BIOPSY Right 02/11/2018   Procedure: DELTOID MUSCLE BIOPSY;  Surgeon: Armandina Gemma, MD;  Location: [place];  Service: General;  Laterality: Right;  . NASAL FRACTURE SURGERY    . TRACHEOSTOMY TUBE PLACEMENT N/A 09/14/2020   Procedure: TRACHEOSTOMY;  Surgeon: Melissa Montane, MD;   Location: [facility];  Service: ENT;  Laterality: N/A;    Assessment & Plan Clinical Impression: Torrie Lafavor Fetters is a 55 year old male with history of DDD, ETOH/substance abuse, necrotizing myopathy treated with steroids and recent addition of MTX, Chronic pain- on suboxone;  who was admitted to [facility] on 08/28/20 with one week history of cough and chest pain due to necrotizing MRSA PNA. CT chest also showed incidental 4.6 cm thoracic aneurysm (semi-annual follow up recommended) and negative for PE. He required pressors with vent support, chest tube to manage large right pleural effusion and developed progressive airspace disease with cavitation of RLL/RML, fluid overload bouts of agitation requiring addition of ketamine to dilaudid/propofol as well as librium taper.  BLE dopplers negative for DVT. CT chest   Dr. Gardiner Rhyme was consulted for input on A fib with elevated trops and global hypokinesis with EF 35-40%. He felt that patient with demand ischemia and CM would be stress v/s ETOH related. PAF converted to NSR on amiodarone with recommendations to transition to oral AC when able. He required tracheostomy by Dr. Janace Hoard was weaned to ATC, tolerated capping and was decannulation by 03/22.  He did developed increase in air space disease with persistent fever and  progressive PNA fel to be due to "high burden of disease" per Dr. Linus Salmons and antibiotics changed to Ceftaroline on 03/08 -->Linezolid today X 2 weeks per ID (can switch to doxycycline 100 mg bid if unable to tolerate latter)  He is tolerating D3, thins with aspiration precautions. Palliative care has been following for input on pain and symptom management--ketamine was weaned off as patient with reports of  neuropathy BLE and Suboxone resumed with addition of tramadol and lidocaine patches for pain management. MRI lumbar spine done for work up and showed mild degenerative changes without stenosis.  Therapy ongoing and patient noted to be limited by balance  deficits with decreased posture as well as tachycardia and hypoxia--sats down to mid 80's with activity. CIR recommended due to functional decline.  Patient transferred to CIR on 09/29/2020 .   Patient currently requires min with mobility secondary to muscle weakness and decreased cardiorespiratoy endurance.  Prior to hospitalization, patient was independent  with mobility and lived with Spouse in a House home.  Home access is 2Stairs to enter.  Patient will benefit from skilled PT intervention to maximize safe functional mobility, minimize fall risk and decrease caregiver burden for planned discharge home with 24 hour supervision.  Anticipate patient will benefit from follow up OP at discharge.  PT - End of Session Activity Tolerance: Improving;Tolerates 30+ min activity with multiple rests Endurance Deficit: Yes Endurance Deficit Description: needs rest breaks, SpO2 83% after stair climbing, back to 90% without seated rest in <1 minute PT Assessment Rehab Potential (ACUTE/IP ONLY): Good PT Patient demonstrates impairments in the following area(s): Balance;Endurance;Motor;Safety;Sensory PT Transfers Functional Problem(s): Bed Mobility;Bed to Chair;Car;Furniture;Floor PT Locomotion Functional Problem(s): Ambulation;Stairs PT Plan PT Intensity: Minimum of 1-2 x/day ,45 to 90 minutes PT Frequency: 5 out of 7 days PT Duration Estimated Length of Stay: 5-7 days PT Treatment/Interventions: Ambulation/gait training;DME/adaptive equipment instruction;Balance/vestibular training;Discharge planning;Stair training;UE/LE Strength taining/ROM;Wheelchair propulsion/positioning;Cognitive remediation/compensation;Disease management/prevention;Functional mobility training;Patient/family education;Therapeutic Exercise;UE/LE Coordination activities;Therapeutic Activities;Neuromuscular re-education PT Transfers Anticipated Outcome(s): S to mod I PT Locomotion Anticipated Outcome(s): S ambulation PT  Recommendation Follow Up Recommendations: Outpatient PT Patient destination: Home Equipment Recommended: None recommended by PT Equipment Details: Has RW and w/c that was his dad's   PT Evaluation Precautions/Restrictions Precautions Precautions: Fall Restrictions Weight Bearing Restrictions: No General   Vital SignsTherapy Vitals Pulse Rate: (!) 101 BP: 107/82 Patient Position (if appropriate): Sitting Oxygen Therapy SpO2: 97 % O2 Device: Room Air Pain Pain Assessment Pain Scale: Faces Pain Score: 7  Faces Pain Scale: Hurts little more Pain Type: Acute pain Pain Location: Foot Pain Orientation: Right;Left Pain Descriptors / Indicators: Burning Pain Frequency: Constant Pain Onset: On-going Patients Stated Pain Goal: 4 Pain Intervention(s): Ambulation/increased activity Home Living/Prior Functioning Home Living Living Arrangements: Spouse/significant other Available Help at Discharge: Family Type of Home: House Home Access: Stairs to enter Technical brewer of Steps: 2 Entrance Stairs-Rails: None Additional Comments: pt's wife is a Marine scientist at [employer] (short stay)  Lives With: Spouse Prior Function Level of Independence: Independent with basic ADLs;Independent with homemaking with ambulation  Able to Take Stairs?: Yes Driving: Yes Vocation: On disability Leisure: Hobbies-yes (Comment) Comments: dog breeding Vision/Perception  Perception Perception: Within Functional Limits Praxis Praxis: Intact  Cognition Overall Cognitive Status: Impaired/Different from baseline Arousal/Alertness: Awake/alert Orientation Level: Oriented X4 Memory: Appears intact Awareness: Impaired Awareness Impairment: Anticipatory impairment;Intellectual impairment Executive Function: Reasoning Reasoning: Impaired Reasoning Impairment: Functional complex Behaviors: Poor frustration tolerance;Restless;Verbal agitation;Impulsive Safety/Judgment: Impaired Comments: decreased  awarenness of deficits Sensation Sensation Light Touch: Appears Intact Coordination Gross Motor Movements are Fluid and Coordinated: No Fine Motor Movements are Fluid and Coordinated: Yes Motor  Motor Motor: Abnormal postural alignment and control Motor - Skilled Clinical Observations: weak core, forward head, generalized weakness   Trunk/Postural  Assessment  Cervical Assessment Cervical Assessment: Exceptions to [facility] (forward head) Thoracic Assessment Thoracic Assessment: Exceptions to [facility] (rounded shoulders) Lumbar Assessment Lumbar Assessment: Within Functional Limits Postural Control Postural Control: Deficits on evaluation (delayed)  Balance Balance Balance Assessed: Yes Dynamic Sitting Balance Dynamic Sitting - Balance Support: No upper extremity supported;Feet supported Dynamic Sitting - Level of Assistance: 5: Stand by assistance Sitting balance - Comments: leaning over assisting with w/c adjustments from edge of mat Dynamic Standing Balance Dynamic Standing - Balance Support: Bilateral upper extremity supported;During functional activity Dynamic Standing - Level of Assistance: 4: Min assist Dynamic Standing - Comments: standing reaching Extremity Assessment      RLE Assessment RLE Assessment: Within Functional Limits Active Range of Motion (AROM) Comments: WFL General Strength Comments: hip flexion 4-/5, knee extension 4/5, knee flexion 4-/5, ankle DF 4/5 LLE Assessment LLE Assessment: Within Functional Limits Active Range of Motion (AROM) Comments: WFL General Strength Comments: hip flexion 4-/5, knee extension 4/5, knee flexion 4-/5, ankle DF 4/5  Care Tool Care Tool Bed Mobility Roll left and right activity   Roll left and right assist level: Independent    Sit to lying activity   Sit to lying assist level: Independent    Lying to sitting edge of bed activity   Lying to sitting edge of bed assist level: Independent     Care Tool Transfers Sit to  stand transfer   Sit to stand assist level: Minimal Assistance - Patient > 75%    Chair/bed transfer   Chair/bed transfer assist level: Minimal Assistance - Patient > 75%     Psychologist, counselling transfer activity did not occur: Safety/medical concerns        Care Tool Locomotion Ambulation   Assist level: Minimal Assistance - Patient > 75% Assistive device: Walker-rolling Max distance: 160'  Walk 10 feet activity   Assist level: Minimal Assistance - Patient > 75% Assistive device: Walker-rolling   Walk 50 feet with 2 turns activity   Assist level: Minimal Assistance - Patient > 75% Assistive device: Walker-rolling  Walk 150 feet activity   Assist level: Minimal Assistance - Patient > 75% Assistive device: Walker-rolling  Walk 10 feet on uneven surfaces activity Walk 10 feet on uneven surfaces activity did not occur: Safety/medical concerns      Stairs   Assist level: Minimal Assistance - Patient > 75% Stairs assistive device: 2 hand rails Max number of stairs: 8 (3")  Walk up/down 1 step activity   Walk up/down 1 step (curb) assist level: Minimal Assistance - Patient > 75% Walk up/down 1 step or curb assistive device: 2 hand rails    Walk up/down 4 steps activity Walk up/down 4 steps assist level: Minimal Assistance - Patient > 75% Walk up/down 4 steps assistive device: 2 hand rails  Walk up/down 12 steps activity Walk up/down 12 steps activity did not occur: Safety/medical concerns      Pick up small objects from floor   Pick up small object from the floor assist level: Supervision/Verbal cueing Pick up small object from the floor assistive device: wrench  Wheelchair Will patient use wheelchair at discharge?: No Type of Wheelchair: Manual   Wheelchair assist level: Supervision/Verbal cueing Max wheelchair distance: 50  Wheel 50 feet with 2 turns activity   Assist Level: Supervision/Verbal cueing  Wheel 150 feet activity Wheelchair 150 feet  activity did not occur: Safety/medical concerns      Refer to Care Plan  for Long Term Goals  SHORT TERM GOAL WEEK 1 PT Short Term Goal 1 (Week 1): STG=LTG due to ELOS  Recommendations for other services: Neuropsych and Therapeutic Recreation  Stress management  Skilled Therapeutic Intervention Patient in w/c in room washing his feet reports they put on cream that made his feet burn more.  He is agreeable to PT evaluation and educated in PT POC and expected LOS.  Performed w/c mobility 50' in the room with S and cues for using brakes for transfers.  Sit to stand min A and ambulated with RW 160' to therapy gym with min A for balance/safety and w/c follow in case of fatigue.  Patient negotiated 8 (3") steps with min A and cues for foot placement and safety step through technique to ascend and step to technique to descend.  Seated on mat demonstrated dynamic sitting balance during w/c adjustments for height with S.  Patient demonstrated bed mobility on mat independently supine<>sit.  Patient Sit to stand to RW min A and ambulated to room with min A with RW noting LOB with min A to recover when turning and maneuvering around obstacles.  Noted SpO2 after ascending steps 83% and HR 120, but recovered quickly standing at top of steps with SpO2 93%.  Patient left seated in w/c in room with alarm belt on and able to access needs and call bell.   Mobility Bed Mobility Bed Mobility: Rolling Right;Sit to Supine;Supine to Sit Rolling Right: Independent Supine to Sit: Independent Sit to Supine: Independent Transfers Transfers: Sit to Stand;Stand to Sit;Stand Pivot Transfers Sit to Stand: Minimal Assistance - Patient > 75% Stand to Sit: Minimal Assistance - Patient > 75% Stand Pivot Transfers: Minimal Assistance - Patient > 75% Stand Pivot Transfer Details: Verbal cues for safe use of DME/AE;Verbal cues for technique;Manual facilitation for weight shifting Transfer (Assistive device): Rolling  walker Locomotion  Gait Gait Distance (Feet): 160 Feet Assistive device: Rolling walker Gait Gait Pattern: Impaired Stairs / Additional Locomotion Stairs: Yes Stairs Assistance: Minimal Assistance - Patient > 75% Stair Management Technique: Two rails;Forwards;Alternating pattern;Step to pattern Number of Stairs: 8 Height of Stairs: 3 Wheelchair Mobility Wheelchair Mobility: Yes Wheelchair Assistance: Chartered loss adjuster: Both lower extermities Wheelchair Parts Management: Needs assistance Distance: 50'   Discharge Criteria: Patient will be discharged from PT if patient refuses treatment 3 consecutive times without medical reason, if treatment goals not met, if there is a change in medical status, if patient makes no progress towards goals or if patient is discharged from hospital.  The above assessment, treatment plan, treatment alternatives and goals were discussed and mutually agreed upon: by patient  Jamison Oka, PT 09/30/2020, 5:10 PM

## 2020-09-30 NOTE — Progress Notes (Signed)
Inpatient Rehabilitation Care Coordinator Assessment and Plan Patient Details  Name: Joseph Ayala MRN: [id number] Date of Birth: 07-07-1966  Today's Date: 09/30/2020  Hospital Problems: Principal Problem:   Debility Active Problems:   Chronic pain syndrome   Necrotizing pneumonia (HCC)   Acute metabolic encephalopathy   Nerve pain   Hyponatremia   Transaminitis   Anemia of chronic disease   Essential hypertension   PAF (paroxysmal atrial fibrillation) (HCC)  Past Medical History:  Past Medical History:  Diagnosis Date  . Degenerative disc disease, lumbar   . Dental crown present   . ETOH abuse   . History of kidney stones   . Hypertension    states under control with meds., has been on med. x 1 yr. - is currently out of med., to see PCP 02/06/2018  . Marijuana use, continuous   . Muscle atrophy 01/2018  . Muscle weakness 01/2018  . Necrotizing myopathy   . Opioid use disorder   . Thoracic aortic aneurysm (TAA) (HCC)    a. seen on CT 08/2020.   Past Surgical History:  Past Surgical History:  Procedure Laterality Date  . ENDOVENOUS ABLATION SAPHENOUS VEIN W/ LASER Right 12-29-2015   endovenous laser ablation right greater saphenous vein, stab phlebectomy > 20 incisions right leg, sclerotherapy right leg by Gretta Began MD    . MINOR MUSCLE BIOPSY Right 02/11/2018   Procedure: RECTUS FEMORIS MUSCLE BIOPSY;  Surgeon: Darnell Level, MD;  Location: [facility];  Service: General;  Laterality: Right;  . MUSCLE BIOPSY Right 02/11/2018   Procedure: DELTOID MUSCLE BIOPSY;  Surgeon: Darnell Level, MD;  Location: [facility];  Service: General;  Laterality: Right;  . NASAL FRACTURE SURGERY    . TRACHEOSTOMY TUBE PLACEMENT N/A 09/14/2020   Procedure: TRACHEOSTOMY;  Surgeon: Suzanna Obey, MD;  Location: [facility];  Service: ENT;  Laterality: N/A;   Social History:  reports that he has never smoked. He has quit using smokeless tobacco. He reports current alcohol use.  He reports that he does not use drugs.  Family / Support Systems Marital Status: Married Patient Roles: Spouse,Parent Spouse/Significant Other: Ginger [phone]-work-short stay  [phone]-cell Children: [name]-daughter traveling RN lives in [place] Other Supports: Friends Anticipated Caregiver: Wife Ability/Limitations of Caregiver: Wife is an Charity fundraiser at [employer] stay currently on a FMLA-has 7 weeks left Caregiver Availability: 24/7 Family Dynamics: Close knit with wife and daughter. All rely upon one another to get through difficult times. Pt feels he has good supports via friends also  Social History Preferred language: English Religion: Baptist Cultural Background: No issues Education: HS Read: Yes Write: Yes Employment Status: Disabled Marine scientist Issues: No issues Guardian/Conservator: None-according to MD pt ois not fully capable of making his own decisions while here. Will look toward his wife to make any decisions while here, until pt progresses to make them on his own   Abuse/Neglect Abuse/Neglect Assessment Can Be Completed: Yes Physical Abuse: Denies Verbal Abuse: Denies Sexual Abuse: Denies Exploitation of patient/patient's resources: Denies Self-Neglect: Denies  Emotional Status Pt's affect, behavior and adjustment status: Pt is able to explain his health issues and the wake up call he recieved from God. He has always een independent and taken care of himself even with his health issues and does not like relying upon others or asking for help. He would rather try to do it on his own and fail. Recent Psychosocial Issues: myositis he is disabled due to this Psychiatric History: No history deferred depression  screen but due to pt's substance abuse will have neuro-psych see while here. Substance Abuse History: Hx ETOH and other drugs-has been started on suboxone while here. Will need follow up in clinic at discharge  Patient / Family Perceptions,  Expectations & Goals Pt/Family understanding of illness & functional limitations: Pt and wife-RN have understanding of his illnesses and conditions. He wants to go home as soon as possible. Both talk with MD's and feel their questions are being addressed. Premorbid pt/family roles/activities: Father, husband, friend, etc Anticipated changes in roles/activities/participation: resume Pt/family expectations/goals: Pt states: " I want to go home soon, I am ready."  Wife states: " I want him better and know how hard headed and stubborn he is."  [employer]: Other (Comment) ([facility]-pt responds no will check with wife) Premorbid Home Care/DME Agencies: None Transportation available at discharge: wife Resource referrals recommended: Neuropsychology  Discharge Planning Living Arrangements: Spouse/significant other Support Systems: Spouse/significant other Type of Residence: Private residence Insurance Resources: Media planner (specify) ([employer] Medicare) Financial Resources: [employer] Support Financial Screen Referred: No Living Expenses: Mortgage Money Management: Patient,Spouse Does the patient have any problems obtaining your medications?: No Home Management: Pt and wife Patient/Family Preliminary Plans: Return home with wife who is currently on FMLA and has 7 weeks left. Concern regarding pt wanting to discharge and not complete the program. Aware team is evaluating and setting goals today. Care Coordinator Barriers to Discharge: Other (comments) Care Coordinator Barriers to Discharge Comments: Pt is wanting to DC home ASAP Care Coordinator Anticipated Follow Up Needs: HH/OP  Clinical Impression Anxious gentleman who is wanting more to do and to go home soon. On the one hand he plans to change his ways and has a new lease on life but on the other he wants to not complete the program and go home. Wife appears to not know what to do with him  and is overwhelmed. Will see if neuro-psych can see early next week and see if will stay to complete the program.  Lucy Chris 09/30/2020, 2:02 PM

## 2020-09-30 NOTE — Evaluation (Signed)
Speech Language Pathology Assessment and Plan  Patient Details  Name: Joseph Ayala MRN: [id number] Date of Birth: [birth year]  SLP Diagnosis: Dysphagia;Cognitive Impairments  Rehab Potential: Good ELOS: 7-10 days    Today's Date: 09/30/2020 SLP Individual Time: 2458-0998 SLP Individual Time Calculation (min): 37 min   Hospital Problem: Principal Problem:   Debility Active Problems:   Chronic pain syndrome   Necrotizing pneumonia (HCC)   Acute metabolic encephalopathy   Nerve pain   Hyponatremia   Transaminitis   Anemia of chronic disease   Essential hypertension   PAF (paroxysmal atrial fibrillation) (HCC)  Past Medical History:  Past Medical History:  Diagnosis Date  . Degenerative disc disease, lumbar   . Dental crown present   . ETOH abuse   . History of kidney stones   . Hypertension    states under control with meds., has been on med. x 1 yr. - is currently out of med., to see PCP 02/06/2018  . Marijuana use, continuous   . Muscle atrophy 01/2018  . Muscle weakness 01/2018  . Necrotizing myopathy   . Opioid use disorder   . Thoracic aortic aneurysm (TAA) ([place])    a. seen on CT 08/2020.   Past Surgical History:  Past Surgical History:  Procedure Laterality Date  . ENDOVENOUS ABLATION SAPHENOUS VEIN W/ LASER Right 12-29-2015   endovenous laser ablation right greater saphenous vein, stab phlebectomy > 20 incisions right leg, sclerotherapy right leg by Curt Jews MD    . MINOR MUSCLE BIOPSY Right 02/11/2018   Procedure: RECTUS FEMORIS MUSCLE BIOPSY;  Surgeon: Armandina Gemma, MD;  Location: [place];  Service: General;  Laterality: Right;  . MUSCLE BIOPSY Right 02/11/2018   Procedure: DELTOID MUSCLE BIOPSY;  Surgeon: Armandina Gemma, MD;  Location: [place];  Service: General;  Laterality: Right;  . NASAL FRACTURE SURGERY    . TRACHEOSTOMY TUBE PLACEMENT N/A 09/14/2020   Procedure: TRACHEOSTOMY;  Surgeon: Melissa Montane, MD;  Location: [facility];   Service: ENT;  Laterality: N/A;    Assessment / Plan / Recommendation Clinical Impression   HPI: 25 Joseph Ayala is a 55 year old male with history of DDD, ETOH/substance abuse, necrotizing myopathy treated with steroids and recent addition of MTX, Chronic pain- on suboxone;  who was admitted to [facility] on 08/28/20 with one week history of cough and chest pain due to necrotizing MRSA PNA. CT chest also showed incidental 4.6 cm thoracic aneurysm (semi-annual follow up recommended) and negative for PE. He required pressors with vent support, chest tube to manage large right pleural effusion and developed progressive airspace disease with cavitation of RLL/RML, fluid overload bouts of agitation requiring addition of ketamine to dilaudid/propofol as well as librium taper.  BLE dopplers negative for DVT. CT chest  Dr. Gardiner Rhyme was consulted for input on A fib with elevated trops and global hypokinesis with EF 35-40%. He felt that patient with demand ischemia and CM would be stress v/s ETOH related. PAF converted to NSR on amiodarone with recommendations to transition to oral AC when able. He required tracheostomy by Dr. Janace Hoard was weaned to ATC, tolerated capping and was decannulation by 03/22.  He did developed increase in air space disease with persistent fever and  progressive PNA fel to be due to "high burden of disease" per Dr. Linus Salmons and antibiotics changed to Ceftaroline on 03/08 -->Linezolid today X 2 weeks per ID (can switch to doxycycline 100 mg bid if unable to tolerate latter) He is  tolerating D3, thins with aspiration precautions. Palliative care has been following for input on pain and symptom management--ketamine was weaned off as patient with reports of  neuropathy BLE and Suboxone resumed with addition of tramadol and lidocaine patches for pain management. MRI lumbar spine done for work up and showed mild degenerative changes without stenosis.  Therapy ongoing and patient noted to be limited by balance  deficits with decreased posture as well as tachycardia and hypoxia--sats down to mid 80's with activity. CIR recommended due to functional decline. Pt was admitted to CIR 09/29/20 and SLP evaluations were completed 09/30/20 with results as follows:  Pt has a hx of aspiration PNA (this hospitalization) and prolonged intubation (22 days). However, he has been decannulated and tolerating a PO diet. No overt s/sx aspiration were observed when pt consumed regular solid textures and thin liquids. His mastication and oral clearance of regular textures was efficient and complete with only Supervision A verbal reminder of safe swallowing strategies. Recommend pt upgrade to regular solids, continue thins and medications whole 1 at a time with thins, intermittent supervision to ensure use of safe positioning and other strategies.   Pt was restless and verbally agitated during evaluation, as he states he believes he could go home and "do what he needs to do" without assistance. He is aware of rationale behind safety precautions to prevent falls in the hospital, but still having difficulty recognizing the impact of his deficits on functioning and fall risk. This behavior is likely more personality based as opposed to acute change, per pt and his wife, and absence of neurological diagnosis. Other gross cognitive skills were determined WFL, and safety awareness will be most effectively targeted during functional mobility and self-care tasks in OT/PT sessions.   Given that pt is still in acute stages of his recovery from aspiration PNA and only been on a PO diet for a short time, would recommend ST follow up with pt briefly while inpatient to ensure diet safety and efficiency. While pt would benefit from 24/7 supervision at home for greatest safety, no follow up ST is anticipated after d/c.    Skilled Therapeutic Interventions          Bedside swallow and cognitive-linguistic evaluations were administered and results were  reviewed with pt and his wife, Joseph Ayala (please see above for details regarding the results).   SLP Assessment  Patient will need skilled Speech Lanaguage Pathology Services during CIR admission    Recommendations  SLP Diet Recommendations: Age appropriate regular solids;Thin Medication Administration: Whole meds with liquid Supervision: Intermittent supervision to cue for compensatory strategies Compensations: Minimize environmental distractions;Slow rate;Small sips/bites Postural Changes and/or Swallow Maneuvers: Seated upright 90 degrees;Upright 30-60 min after meal Oral Care Recommendations: Oral care BID Recommendations for Other Services: Neuropsych consult Patient destination: Home Follow up Recommendations: 24 hour supervision/assistance;None Equipment Recommended: None recommended by SLP    SLP Frequency 3 to 5 out of 7 days   SLP Duration  SLP Intensity  SLP Treatment/Interventions 7-10 days  Minumum of 1-2 x/day, 30 to 90 minutes  Cueing hierarchy;Dysphagia/aspiration precaution training;Functional tasks;Patient/family education    Pain Pain Assessment Pain Scale: Faces Pain Score: 7  Faces Pain Scale: No hurt Pain Location: Foot Pain Orientation: Right;Left Pain Descriptors / Indicators: Aching;Burning Pain Frequency: Constant Pain Onset: On-going Patients Stated Pain Goal: 4 Pain Intervention(s): Medication (See eMAR)     SLP Evaluation Cognition Overall Cognitive Status: Impaired/Different from baseline Arousal/Alertness: Awake/alert Orientation Level: Oriented X4 Memory: Appears intact Awareness: Impaired  Awareness Impairment: Anticipatory impairment;Intellectual impairment Executive Function: Reasoning Reasoning: Impaired Reasoning Impairment: Functional complex Behaviors: Poor frustration tolerance;Restless;Verbal agitation;Impulsive Safety/Judgment: Impaired Comments: decreased awarenness of deficits  Comprehension Auditory  Comprehension Overall Auditory Comprehension: Appears within functional limits for tasks assessed Conversation: Complex Visual Recognition/Discrimination Discrimination: Within Function Limits Reading Comprehension Reading Status: Not tested Expression Expression Primary Mode of Expression: Verbal Verbal Expression Overall Verbal Expression: Appears within functional limits for tasks assessed Written Expression Written Expression: Not tested Oral Motor Oral Motor/Sensory Function Overall Oral Motor/Sensory Function: Within functional limits Motor Speech Overall Motor Speech: Appears within functional limits for tasks assessed Intelligibility: Intelligible Motor Planning: Witnin functional limits Motor Speech Errors: Not applicable  Care Tool Care Tool Cognition Expression of Ideas and Wants Expression of Ideas and Wants: Without difficulty (complex and basic) - expresses complex messages without difficulty and with speech that is clear and easy to understand   Understanding Verbal and Non-Verbal Content Understanding Verbal and Non-Verbal Content: Understands (complex and basic) - clear comprehension without cues or repetitions   Memory/Recall Ability *first 3 days only Memory/Recall Ability *first 3 days only: Current season;Location of own room;Staff names and faces;That he or she is in a hospital/hospital unit     Intelligibility: Intelligible  Bedside Swallowing Assessment General Date of Onset: 09/22/20 Previous Swallow Assessment: BSE 09/22/20, MBS 09/23/20 Diet Prior to this Study: Dysphagia 3 (soft);Thin liquids Temperature Spikes Noted: No Respiratory Status: Room air History of Recent Intubation: Yes Length of Intubations (days): 22 days Date extubated: 09/20/20 Behavior/Cognition: Alert;Agitated;Impulsive;Fusing/Irritable Oral Cavity - Dentition: Adequate natural dentition Vision: Functional for self-feeding Patient Positioning: Upright in  chair/Tumbleform Baseline Vocal Quality: Normal Volitional Cough: Strong Volitional Swallow: Able to elicit  Oral Care Assessment Does patient have any of the following "high(er) risk" factors?: None of the above Does patient have any of the following "at risk" factors?: Other - dysphagia Patient is AT RISK: Order set for Adult Oral Care Protocol initiated -  "At Risk Patients" option selected (see row information) Patient is LOW RISK: Follow universal precautions (see row information) Ice Chips Ice chips: Not tested Thin Liquid Thin Liquid: Within functional limits Presentation: Straw;Cup;Self Fed Nectar Thick Nectar Thick Liquid: Not tested Honey Thick Honey Thick Liquid: Not tested Puree Puree: Not tested Solid Solid: Within functional limits Presentation: Self Fed BSE Assessment Risk for Aspiration Impact on safety and function: Mild aspiration risk Other Related Risk Factors: History of pneumonia;Decreased respiratory status  Short Term Goals: Week 1: SLP Short Term Goal 1 (Week 1): STG=LTG due to estimated length of stay  Refer to Care Plan for Long Term Goals  Recommendations for other services: Neuropsych  Discharge Criteria: Patient will be discharged from SLP if patient refuses treatment 3 consecutive times without medical reason, if treatment goals not met, if there is a change in medical status, if patient makes no progress towards goals or if patient is discharged from hospital.  The above assessment, treatment plan, treatment alternatives and goals were discussed and mutually agreed upon: by patient and wife, Joseph Ayala  Arbutus Leas 09/30/2020, 2:49 PM

## 2020-09-30 NOTE — TOC Benefit Eligibility Note (Signed)
Patient Advocate Encounter  Insurance verification completed.    The patient is currently admitted and upon discharge could be taking Eliquis 5 mg.  The current 30 day co-pay is, $45.00.   The patient is insured through [employer] Medicare Part D     Jeffrey Smith, CPhT Pharmacy Patient Advocate Specialist  Antimicrobial Stewardship Team Direct Number: [phone]  Fax: [phone]        

## 2020-09-30 NOTE — Progress Notes (Signed)
Physical Therapy Session Note  Patient Details  Name: Joseph Ayala MRN: [id number] Date of Birth: May 21, 1966  Today's Date: 09/30/2020 PT Individual Time: 1510-1530 PT Individual Time Calculation (min): 20 min   Short Term Goals: Week 1:  PT Short Term Goal 1 (Week 1): STG=LTG due to ELOS  Skilled Therapeutic Interventions/Progress Updates:  Patient in recliner with wife in the room and eager for some homework exercises to do in the room as wife reports pt has difficulty being still.  Educated and pt performed supine bridging with 5 sec hold x 10, SLR x 10 and sidelying clamshell hip abduction x 10 each side.  Brought pedal exerciser to room and pt performed x 3 minutes with assist for transfer to w/c and to place feet on pedals.  Educated only to perform when someone can safely set him up on it and NOT to transfer to w/c from recliner or bed on his own.  Wife in the room and reinforced.  PAtient min a for transfer back to recliner and left with wife in the room.   Therapy Documentation Precautions:  Precautions Precautions: Fall Restrictions Weight Bearing Restrictions: No Pain: Pain Assessment Pain Scale: Faces Pain Score: 7  Faces Pain Scale: Hurts little more Pain Type: Acute pain Pain Location: Foot Pain Orientation: Right;Left Pain Descriptors / Indicators: Burning Pain Frequency: Constant Pain Onset: On-going Patients Stated Pain Goal: 4 Pain Intervention(s): Ambulation/increased activity    Therapy/Group: Individual Therapy  Elray Mcgregor  [place], PT 09/30/2020, 5:25 PM

## 2020-09-30 NOTE — Evaluation (Signed)
Occupational Therapy Assessment and Plan  Patient Details  Name: Joseph Ayala MRN: [id number] Date of Birth: [birth year]  OT Diagnosis: abnormal posture, acute pain, cognitive deficits and muscle weakness (generalized) Rehab Potential: Rehab Potential (ACUTE ONLY): Good ELOS: 7-10   Today's Date: 09/30/2020 OT Individual Time: 2025-4270 OT Individual Time Calculation (min): 75 min     Hospital Problem: Principal Problem:   Debility Active Problems:   Chronic pain syndrome   Necrotizing pneumonia (HCC)   Acute metabolic encephalopathy   Nerve pain   Hyponatremia   Transaminitis   Anemia of chronic disease   Essential hypertension   PAF (paroxysmal atrial fibrillation) (HCC)   Past Medical History:  Past Medical History:  Diagnosis Date  . Degenerative disc disease, lumbar   . Dental crown present   . ETOH abuse   . History of kidney stones   . Hypertension    states under control with meds., has been on med. x 1 yr. - is currently out of med., to see PCP 02/06/2018  . Marijuana use, continuous   . Muscle atrophy 01/2018  . Muscle weakness 01/2018  . Necrotizing myopathy   . Opioid use disorder   . Thoracic aortic aneurysm (TAA) ([place])    a. seen on CT 08/2020.   Past Surgical History:  Past Surgical History:  Procedure Laterality Date  . ENDOVENOUS ABLATION SAPHENOUS VEIN W/ LASER Right 12-29-2015   endovenous laser ablation right greater saphenous vein, stab phlebectomy > 20 incisions right leg, sclerotherapy right leg by Curt Jews MD    . MINOR MUSCLE BIOPSY Right 02/11/2018   Procedure: RECTUS FEMORIS MUSCLE BIOPSY;  Surgeon: Armandina Gemma, MD;  Location: [place];  Service: General;  Laterality: Right;  . MUSCLE BIOPSY Right 02/11/2018   Procedure: DELTOID MUSCLE BIOPSY;  Surgeon: Armandina Gemma, MD;  Location: [place];  Service: General;  Laterality: Right;  . NASAL FRACTURE SURGERY    . TRACHEOSTOMY TUBE PLACEMENT N/A 09/14/2020    Procedure: TRACHEOSTOMY;  Surgeon: Melissa Montane, MD;  Location: [facility];  Service: ENT;  Laterality: N/A;    Assessment & Plan Clinical Impression: Patient is a 55 y.o. male s/p tracheotomy on 09/14/2020 who presented to the ED with complaints of chest pain, cough, and SOB admitted for acute hypoxemic respiratory failure secondary to MRSA community-acquired pneumonia / aspiration with PMH significant for aortic aneurysm, HTN,  ETOH abuse, and opioid use.  Patient currently requires min with basic self-care skills secondary to muscle weakness, decreased cardiorespiratoy endurance, decreased awareness, decreased problem solving and decreased safety awareness and decreased sitting balance, decreased standing balance, decreased postural control and decreased balance strategies.  Prior to hospitalization, patient could complete BADL/IADL with independent .  Patient will benefit from skilled intervention to decrease level of assist with basic self-care skills and increase independence with basic self-care skills prior to discharge home with care partner.  Anticipate patient will require 24 hour supervision and follow up home health.  OT - End of Session Activity Tolerance: Tolerates 30+ min activity with multiple rests Endurance Deficit: Yes OT Assessment Rehab Potential (ACUTE ONLY): Good OT Barriers to Discharge: Decreased caregiver support;Lack of/limited family support OT Barriers to Discharge Comments: unsure support after DC- wife RN likely on FMLA per pt OT Patient demonstrates impairments in the following area(s): Balance;Cognition;Endurance;Motor;Pain;Safety;Sensory OT Basic ADL's Functional Problem(s): Grooming;Bathing;Dressing;Toileting OT Transfers Functional Problem(s): Toilet;Tub/Shower OT Additional Impairment(s): None OT Plan OT Intensity: Minimum of 1-2 x/day, 45 to 90 minutes OT Frequency:  5 out of 7 days OT Duration/Estimated Length of Stay: 7-10 OT Treatment/Interventions:  Balance/vestibular training;Discharge planning;Pain management;Self Care/advanced ADL retraining;Therapeutic Activities;UE/LE Coordination activities;Visual/perceptual remediation/compensation;Therapeutic Exercise;Skin care/wound managment;Patient/family education;Functional mobility training;Disease mangement/prevention;Cognitive remediation/compensation;Community reintegration;DME/adaptive equipment instruction;Neuromuscular re-education;Psychosocial support;Splinting/orthotics;UE/LE Strength taining/ROM;Wheelchair propulsion/positioning OT Self Feeding Anticipated Outcome(s): S OT Basic Self-Care Anticipated Outcome(s): S OT Toileting Anticipated Outcome(s): S OT Bathroom Transfers Anticipated Outcome(s): S OT Recommendation Patient destination: Home Follow Up Recommendations: Home health OT Equipment Recommended: 3 in 1 bedside comode;To be determined;Tub/shower bench   OT Evaluation Precautions/Restrictions  Precautions Precautions: Fall Restrictions Weight Bearing Restrictions: No General Chart Reviewed: Yes Vital Signs Therapy Vitals Pulse Rate: 94 BP: 118/72 Pain Pain Assessment Pain Score: 5  Home Living/Prior Functioning Home Living Family/patient expects to be discharged to:: Private residence Living Arrangements: Spouse/significant other,Children Available Help at Discharge: Family Type of Home: House Home Access: Stairs to enter Technical brewer of Steps: 2 Entrance Stairs-Rails: None Home Layout: One level Additional Comments: pt's wife is a Marine scientist at [employer] and is available to assist. All home living information provided by wife who was present during  acute session Prior Function Level of Independence: Independent with basic ADLs,Independent with homemaking with ambulation Vision Vision Assessment?: No apparent visual deficits Perception  Perception: Within Functional Limits Praxis Praxis: Intact Cognition Overall Cognitive Status: Impaired/Different  from baseline Arousal/Alertness: Awake/alert Orientation Level: Person;Place;Situation Person: Oriented Place: Oriented Situation: Oriented Year: 2022 Month: March Day of Week: Correct Memory: Appears intact Immediate Memory Recall: Sock;Blue;Bed Memory Recall Sock: Without Cue Memory Recall Blue: Without Cue Memory Recall Bed: Without Cue Awareness: Impaired Safety/Judgment: Impaired Sensation Sensation Light Touch: Appears Intact Coordination Gross Motor Movements are Fluid and Coordinated: No (UE) Fine Motor Movements are Fluid and Coordinated: Yes (UE) Motor  Motor Motor: Abnormal postural alignment and control  Trunk/Postural Assessment  Cervical Assessment Cervical Assessment:  (head forward) Thoracic Assessment Thoracic Assessment:  (rounded shoulders) Lumbar Assessment Lumbar Assessment:  (post pelvic tilt) Postural Control Postural Control: Deficits on evaluation (delayed)  Balance Balance Balance Assessed: Yes Dynamic Sitting Balance Dynamic Sitting - Level of Assistance: 5: Stand by assistance Sitting balance - Comments: dressing Dynamic Standing Balance Dynamic Standing - Level of Assistance: 4: Min assist Dynamic Standing - Comments: dressing Extremity/Trunk Assessment RUE Assessment RUE Assessment: Within Functional Limits General Strength Comments: generlized weakness; shoudler strength 2+/5 LUE Assessment LUE Assessment: Within Functional Limits General Strength Comments: generalized weakness 2+/5  Care Tool Care Tool Self Care Eating   Eating Assist Level: Set up assist    Oral Care    Oral Care Assist Level: Minimal Assistance - Patient > 75% (standing)    Bathing   Body parts bathed by patient: Right arm;Left arm;Chest;Abdomen;Front perineal area;Buttocks;Right upper leg;Left upper leg;Right lower leg;Left lower leg;Face     Assist Level: Minimal Assistance - Patient > 75%    Upper Body Dressing(including orthotics)   What is the  patient wearing?: Pull over shirt   Assist Level: Minimal Assistance - Patient > 75% (standing)    Lower Body Dressing (excluding footwear)   What is the patient wearing?: Underwear/pull up;Pants Assist for lower body dressing: Minimal Assistance - Patient > 75%    Putting on/Taking off footwear   What is the patient wearing?: Socks;Shoes Assist for footwear: Set up assist       Care Tool Toileting Toileting activity   Assist for toileting: Minimal Assistance - Patient > 75%     Care Tool Bed Mobility Roll left and right activity  Sit to lying activity        Lying to sitting edge of bed activity         Care Tool Transfers Sit to stand transfer   Sit to stand assist level: Minimal Assistance - Patient > 75%    Chair/bed transfer   Chair/bed transfer assist level: Minimal Assistance - Patient > 75%     Toilet transfer   Assist Level: Minimal Assistance - Patient > 75%     Care Tool Cognition Expression of Ideas and Wants     Understanding Verbal and Non-Verbal Content     Memory/Recall Ability *first 3 days only      Refer to Care Plan for Long Term Goals  SHORT TERM GOAL WEEK 1 OT Short Term Goal 1 (Week 1): STG=LTG d/t ELOS  Recommendations for other services: Therapeutic Recreation  Pet therapy, Stress management and Outing/community reintegration   Skilled Therapeutic Intervention 1;1. Pt received in bed agreeable to OT. Pt educated on OT role/purpose, CIR, ELOS, POC and goal setting. Pt completes bathing at shower level, toielting from regular toilet, and dressing at sit to stand level at sink as stated below. Pt demo overall proximal weakness at shoulders and hips impacting posture and ADL performance. Pt remains on RA throughout with stoma covered satting in mid 90s and HR 110s-120s. Exited session with pt seated in bed, exit alarm on and call light in reach  ADL ADL Eating: Set up Grooming: Minimal assistance Where Assessed-Grooming:  Standing at sink Upper Body Bathing: Supervision/safety Where Assessed-Upper Body Bathing: Shower Lower Body Bathing: Minimal assistance Where Assessed-Lower Body Bathing: Shower Upper Body Dressing: Minimal assistance Where Assessed-Upper Body Dressing: Standing at sink Lower Body Dressing: Minimal assistance Where Assessed-Lower Body Dressing: Standing at sink Toileting: Minimal assistance Where Assessed-Toileting: Glass blower/designer: Psychiatric nurse Method: Counselling psychologist: Energy manager: Environmental education officer Method: Ambulating Mobility  Transfers Sit to Stand: Minimal Assistance - Patient > 75% Stand to Sit: Minimal Assistance - Patient > 75%   Discharge Criteria: Patient will be discharged from OT if patient refuses treatment 3 consecutive times without medical reason, if treatment goals not met, if there is a change in medical status, if patient makes no progress towards goals or if patient is discharged from hospital.  The above assessment, treatment plan, treatment alternatives and goals were discussed and mutually agreed upon: by patient  Tonny Branch 09/30/2020, 11:13 AM

## 2020-10-01 NOTE — Progress Notes (Signed)
[place] PHYSICAL MEDICINE & REHABILITATION PROGRESS NOTE  Subjective/Complaints: Patient seen laying in bed this morning.  He states he slept well overnight.  He states he recuperated therapies yesterday.  He notes he did not try heat on his feet, as discussed yesterday, and can manage with just medications.  ROS: Denies CP, SOB, N/V/D  Objective: Vital Signs: Blood pressure 102/74, pulse 97, temperature 98.3 F (36.8 C), temperature source Oral, resp. rate 18, SpO2 92 %. No results found. Recent Labs    09/30/20 0516  WBC 8.9  HGB 10.9*  HCT 33.3*  PLT 267   Recent Labs    09/30/20 0516  NA 131*  K 4.0  CL 93*  CO2 29  GLUCOSE 92  BUN 14  CREATININE 0.62  CALCIUM 10.1    Intake/Output Summary (Last 24 hours) at 10/01/2020 1325 Last data filed at 10/01/2020 0160 Gross per 24 hour  Intake 480 ml  Output 1700 ml  Net -1220 ml     Pressure Injury 09/07/20 Buttocks Right;Left Stage 2 -  Partial thickness loss of dermis presenting as a shallow open injury with a red, pink wound bed without slough. one stage 2 on each buttock (Active)  09/07/20 1744  Location: Buttocks  Location Orientation: Right;Left  Staging: Stage 2 -  Partial thickness loss of dermis presenting as a shallow open injury with a red, pink wound bed without slough.  Wound Description (Comments): one stage 2 on each buttock  Present on Admission: No    Physical Exam: BP 102/74 (BP Location: Right Arm)   Pulse 97   Temp 98.3 F (36.8 C) (Oral)   Resp 18   SpO2 92%  Constitutional: No distress . Vital signs reviewed. HENT: Normocephalic.  Atraumatic. Neck: + Stoma Eyes: EOMI. No discharge. Cardiovascular: No JVD.  RRR. Respiratory: Normal effort.  No stridor.  Bilateral clear to auscultation. GI: Non-distended.  BS +. Skin: Warm and dry.  Intact. Psych: Normal mood.  Normal behavior. Musc: No edema in extremities.  No tenderness in extremities. Neuro: Alert Motor: Motor: 4 --4/5  throughout Sensation diminished to light touch in bilateral lower extremities  Assessment/Plan: 1. Functional deficits which require 3+ hours per day of interdisciplinary therapy in a comprehensive inpatient rehab setting.  Physiatrist is providing close team supervision and 24 hour management of active medical problems listed below.  Physiatrist and rehab team continue to assess barriers to discharge/monitor patient progress toward functional and medical goals   Care Tool:  Bathing    Body parts bathed by patient: Right arm,Left arm,Chest,Abdomen,Front perineal area,Buttocks,Right upper leg,Left upper leg,Right lower leg,Left lower leg,Face         Bathing assist Assist Level: Minimal Assistance - Patient > 75%     Upper Body Dressing/Undressing Upper body dressing   What is the patient wearing?: Pull over shirt    Upper body assist Assist Level: Minimal Assistance - Patient > 75% (standing)    Lower Body Dressing/Undressing Lower body dressing      What is the patient wearing?: Underwear/pull up,Pants     Lower body assist Assist for lower body dressing: Minimal Assistance - Patient > 75%     Toileting Toileting    Toileting assist Assist for toileting: Minimal Assistance - Patient > 75%     Transfers Chair/bed transfer  Transfers assist     Chair/bed transfer assist level: Minimal Assistance - Patient > 75%     Locomotion Ambulation   Ambulation assist  Assist level: Minimal Assistance - Patient > 75% Assistive device: Walker-rolling Max distance: 160'   Walk 10 feet activity   Assist     Assist level: Minimal Assistance - Patient > 75% Assistive device: Walker-rolling   Walk 50 feet activity   Assist    Assist level: Minimal Assistance - Patient > 75% Assistive device: Walker-rolling    Walk 150 feet activity   Assist    Assist level: Minimal Assistance - Patient > 75% Assistive device: Walker-rolling    Walk 10 feet  on uneven surface  activity   Assist Walk 10 feet on uneven surfaces activity did not occur: Safety/medical concerns         Wheelchair     Assist Will patient use wheelchair at discharge?: No Type of Wheelchair: Manual    Wheelchair assist level: Supervision/Verbal cueing Max wheelchair distance: 50    Wheelchair 50 feet with 2 turns activity    Assist        Assist Level: Supervision/Verbal cueing   Wheelchair 150 feet activity     Assist  Wheelchair 150 feet activity did not occur: Safety/medical concerns        Medical Problem List and Plan: 1.  Debility secondary to necrotizing pneumonia, multiple ventilation episodes/trach and anasarca   Continue CIR 2.  PAF/Antithrombotics: -DVT/anticoagulation:  Pharmaceutical: Other (comment)--Eliquis.              -antiplatelet therapy: N/A 3. Pain Management: Continue Suboxone bid             Increased gabapentin to 300 mg tid for neuropathy             States lidocaine patches exacerbated pain  Continue capsaicin cream.   Trial of heat packs on 3/25 for bilateral feet, patient may try if desired  Controlled with meds on 3/26 4. Mood: LCSW to follow for evaluation and support.              -antipsychotic agents: N/A 5. Neuropsych: This patient is? capable of making decisions on his own behalf. 6. Skin/Wound Care: Routine pressure relief measures.  7. Fluids/Electrolytes/Nutrition: Monitor I/Os 8. MRSA Necrotizing PNA: Continue Zyvox through 4/6 9. 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 MTX did NOT work due to side effects 10. Delirium: Encephalopathy resolving  Continue Seroquel bid at present, will plan to wean 11. PAF/Tachycardia: Monitor HR tid--continue metoprolol and Eliquis bid.  Monitor with increased activity 12. Alcohol abuse: Has completed librium taper.   Continue Seroquel and Klonopin bid.             Continue Folic acid/thiamine 13. HTN: Monitor BP tid--on lasix and low dose  metoprolol.   Relatively controlled on 3/26  Monitor with increased mobility 14. Anemia of chronic disease?: At baseline of 10. Monitor for signs of bleeding.   Hemoglobin 10.9 on 3/25  Continue to monitor 15.  Transaminitis:  LFTs elevated on 3/25, labs ordered for Monday 16.  Hyponatremia  Sodium 131 on 3/25, labs ordered for Monday  LOS: 2 days A FACE TO FACE EVALUATION WAS PERFORMED  Joseph Ayala Joseph Ayala 10/01/2020, 1:25 PM

## 2020-10-01 NOTE — Discharge Instructions (Addendum)
Inpatient Rehab Discharge Instructions  Joseph Ayala Discharge date and time:  10/06/20 Activities/Precautions/ Functional Status: Activity: no lifting, driving, or strenuous exercise till cleared by MD Diet: regular diet Wound Care: keep wound clean and dry    Functional status:  ___ No restrictions     ___ Walk up steps independently _X__ 24/7 supervision/assistance   ___ Walk up steps with assistance ___ Intermittent supervision/assistance  ___ Bathe/dress independently ___ Walk with walker     ___ Bathe/dress with assistance ___ Walk Independently    ___ Shower independently ___ Walk with assistance    ___ Shower with assistance _X__ No alcohol     ___ Return to work/school ________   Special Instructions: 1. Suboxone was filled on 02/17 # 75 pills--admitted to hospital on 02/20-->you should have almost a month's supply at home. Note that dose was decreased to one film twice a day.      COMMUNITY REFERRALS UPON DISCHARGE:    Home Health:   PT                  Agency:[facility] Phone:[phone]   Medical Equipment/Items Ordered:HAS ROLLING WALKER FROM DAD AND WIFE ORDERED TUB SEAT                                                 Agency/Supplier:NA  GENERAL COMMUNITY RESOURCES FOR PATIENT/FAMILY: Support Groups:[employer] SUPPORT GROUPS IN [place]-HANDOUT GIVEN  My questions have been answered and I understand these instructions. I will adhere to these goals and the provided educational materials after my discharge from the hospital.  Patient/Caregiver Signature _______________________________ Date __________  Clinician Signature _______________________________________ Date __________  Please bring this form and your medication list with you to all your follow-up doctor's appointments.    Information on my medicine - ELIQUIS (apixaban)  This medication education was reviewed with me or my healthcare representative as part of my discharge preparation.   Why  was Eliquis prescribed for you? Eliquis was prescribed for you to reduce the risk of a blood clot forming that can cause a stroke if you have a medical condition called atrial fibrillation (a type of irregular heartbeat).  What do You need to know about Eliquis ? Take your Eliquis TWICE DAILY - one tablet in the morning and one tablet in the evening with or without food. If you have difficulty swallowing the tablet whole please discuss with your pharmacist how to take the medication safely.  Take Eliquis exactly as prescribed by your doctor and DO NOT stop taking Eliquis without talking to the doctor who prescribed the medication.  Stopping may increase your risk of developing a stroke.  Refill your prescription before you run out.  After discharge, you should have regular check-up appointments with your healthcare provider that is prescribing your Eliquis.  In the future your dose may need to be changed if your kidney function or weight changes by a significant amount or as you get older.  What do you do if you miss a dose? If you miss a dose, take it as soon as you remember on the same day and resume taking twice daily.  Do not take more than one dose of ELIQUIS at the same time to make up a missed dose.  Important Safety Information A possible side effect of Eliquis is bleeding. You should call your healthcare  provider right away if you experience any of the following: ? Bleeding from an injury or your nose that does not stop. ? Unusual colored urine (red or dark brown) or unusual colored stools (red or black). ? Unusual bruising for unknown reasons. ? A serious fall or if you hit your head (even if there is no bleeding).  Some medicines may interact with Eliquis and might increase your risk of bleeding or clotting while on Eliquis. To help avoid this, consult your healthcare provider or pharmacist prior to using any new prescription or non-prescription medications, including  herbals, vitamins, non-steroidal anti-inflammatory drugs (NSAIDs) and supplements.  This website has more information on Eliquis (apixaban): http://[email or web address].com/eliquis/home

## 2020-10-01 NOTE — Plan of Care (Signed)
  Problem: Consults Goal: RH GENERAL PATIENT EDUCATION Description: See Patient Education module for education specifics. Outcome: Progressing Goal: Skin Care Protocol Initiated - if Braden Score 18 or less Description: If consults are not indicated, leave blank or document N/A Outcome: Progressing   Problem: RH BOWEL ELIMINATION Goal: RH STG MANAGE BOWEL WITH ASSISTANCE Description: STG Manage Bowel with Assistance. Outcome: Progressing Goal: RH STG MANAGE BOWEL W/MEDICATION W/ASSISTANCE Description: STG Manage Bowel with Medication with Assistance. Outcome: Progressing   Problem: RH BLADDER ELIMINATION Goal: RH STG MANAGE BLADDER WITH ASSISTANCE Description: STG Manage Bladder With Assistance Outcome: Progressing   Problem: RH SKIN INTEGRITY Goal: RH STG SKIN FREE OF INFECTION/BREAKDOWN Outcome: Progressing Goal: RH STG MAINTAIN SKIN INTEGRITY WITH ASSISTANCE Description: STG Maintain Skin Integrity With Assistance. Outcome: Progressing Goal: RH STG ABLE TO PERFORM INCISION/WOUND CARE W/ASSISTANCE Description: STG Able To Perform Incision/Wound Care With Assistance. Outcome: Progressing   Problem: RH SAFETY Goal: RH STG ADHERE TO SAFETY PRECAUTIONS W/ASSISTANCE/DEVICE Description: STG Adhere to Safety Precautions With Assistance/Device. Outcome: Progressing Goal: RH STG DECREASED RISK OF FALL WITH ASSISTANCE Description: STG Decreased Risk of Fall With Assistance. Outcome: Progressing   Problem: RH PAIN MANAGEMENT Goal: RH STG PAIN MANAGED AT OR BELOW PT'S PAIN GOAL Outcome: Progressing   Problem: RH KNOWLEDGE DEFICIT GENERAL Goal: RH STG INCREASE KNOWLEDGE OF SELF CARE AFTER HOSPITALIZATION Outcome: Progressing   

## 2020-10-01 NOTE — Progress Notes (Signed)
Occupational Therapy Session Note  Patient Details  Name: Joseph Ayala MRN: [id number] Date of Birth: 1965-10-28  Today's Date: 10/01/2020 OT Individual Time: 1110-1140 OT Individual Time Calculation (min): 30 min    Short Term Goals: Week 1:  OT Short Term Goal 1 (Week 1): STG=LTG d/t ELOS  Skilled Therapeutic Interventions/Progress Updates:    1;1. Pt received in bed agreeable to OT. Pt impulsive with mobility throughout session. Pt wife present as well. Pt changes clothing at EOB with MIN A overall for sit to stand/clothing management. Wife confirms postural alignment abnormality at baseline- either stooped or knees bent and hips extended/trunk leaned back. Pt ambulates to ADL apartment and OT cues pt through TTB sequencing. Pt requires MIN A overall for transfer into/out of tub with TTB. Pt returns to room and OT issues 5# dumbbells for pt to be able to completes 3x10 shoulder raise, chest press, bicep curl, tricep extension and circles in B direction. therex written on board for pt to refer to at meal times. Exited session with pt seated in bed, exit alarm on and call light in reach   Therapy Documentation Precautions:  Precautions Precautions: Fall Restrictions Weight Bearing Restrictions: No General:   Vital Signs: Therapy Vitals Temp: 98.3 F (36.8 C) Temp Source: Oral Pulse Rate: 97 Resp: 18 BP: 102/74 Patient Position (if appropriate): Lying Oxygen Therapy SpO2: 92 % O2 Device: Room Air Pain:   ADL: ADL Eating: Set up Grooming: Minimal assistance Where Assessed-Grooming: Standing at sink Upper Body Bathing: Supervision/safety Where Assessed-Upper Body Bathing: Shower Lower Body Bathing: Minimal assistance Where Assessed-Lower Body Bathing: Shower Upper Body Dressing: Minimal assistance Where Assessed-Upper Body Dressing: Standing at sink Lower Body Dressing: Minimal assistance Where Assessed-Lower Body Dressing: Standing at sink Toileting: Minimal  assistance Where Assessed-Toileting: Teacher, adult education: Curator Method: Proofreader: Acupuncturist: Insurance underwriter Method: [employer]    Praxis   Exercises:   Other Treatments:     Therapy/Group: Individual Therapy  Shon Hale 10/01/2020, 6:50 AM

## 2020-10-02 NOTE — Progress Notes (Signed)
Occupational Therapy Session Note  Patient Details  Name: Joseph Ayala MRN: [id number] Date of Birth: 1965/07/13  Today's Date: 10/02/2020 OT Individual Time: 1000-1100 OT Individual Time Calculation (min): 60 min    Short Term Goals: Week 1:  OT Short Term Goal 1 (Week 1): STG=LTG d/t ELOS  Skilled Therapeutic Interventions/Progress Updates:    Pt received supine, sleeping, awoken with vc and agreeable to OT session. Pt requesting to use the bathroom. He donned shorts with CGA. Pt completed ambulatory transfer with RW to the bathroom with CGA, fading to supervision for standing level urine void. Pt completed transfer back to sink and took a seated rest break. Pt completed oral care with set up assist. Pt propelled w/c to the therapy gym, with desaturation to 84%, with seated rest break he rebounded to 93%. Pt had fluctuating SpO2 between 83-90% while he sat and talked. Question accuracy of pulse oximeter as pt with no s/s of hypoxia. Pt completed 2x activity focused on functional activity tolerance with reciprocal stepping in standing with CGA. SpO2 was more stabilized at 93% and greater. Pt reporting B foot pain, described as burning, 7/10- distracted by this pain and talking about it throughout session. Extensive time spend educating on importance of maintaining Spo2 >90%. Pt completed 3x 25 ft of functional mobility with improvement in Spo2 stability- maintaining at 94% after 3 trials with seated rest break between. Encouraged pt to stay up in chair more for lung expansion/breathing quality. Notified RN of benefit of incentive spirometer for pt and she acquired one for him and provided edu. Pt was left sitting in the w/c with chair alarm belt fastened, all needs met.   Therapy Documentation Precautions:  Precautions Precautions: Fall Restrictions Weight Bearing Restrictions: No   Therapy/Group: Individual Therapy  Curtis Sites 10/02/2020, 7:29 AM

## 2020-10-02 NOTE — Progress Notes (Signed)
Physical Therapy Session Note  Patient Details  Name: Joseph Ayala MRN: [id number] Date of Birth: [birth year]  Today's Date: 10/02/2020 PT Individual Time: 1315-1400 PT Individual Time Calculation (min): 45 min  PT Amount of Missed Time (min): 15 Minutes PT Missed Treatment Reason: Unavailable (Comment) (eating lunch)  Short Term Goals: Week 1:  PT Short Term Goal 1 (Week 1): STG=LTG due to ELOS  Skilled Therapeutic Interventions/Progress Updates:    Pt received seated in bed eating lunch, pt given time to finish eating. This therapist returned after 15 min, pt done eating and agreeable to PT session. Pt reports ongoing nerve pain in his feet, not rated and declines intervention. Pt performs bed mobility independently. Sit to stand with CGA and RW throughout session. Ambulation up to 160 ft with RW and CGA for balance. Pt exhibits occasional ataxia during gait as well as flexed knees. Car transfer with CGA and RW. Discussed safer car transfer method of backing up to sit down vs climbing LE first into car, pt agreeable to safer method. Patient demonstrates increased fall risk as noted by score of  43/56 on Berg Balance Scale.  (<36= high risk for falls, close to 100%; 37-45 significant >80%; 46-51 moderate >50%; 52-55 lower >25%). Discussed score and functional implications. SpO2 remains at 95% and higher on room air throughout session. Pt left seated in bed with needs in reach, bed alarm in place at end of session.  Therapy Documentation Precautions:  Precautions Precautions: Fall Restrictions Weight Bearing Restrictions: No    Balance: Standardized Balance Assessment Standardized Balance Assessment: Berg Balance Test Berg Balance Test Sit to Stand: Able to stand  independently using hands Standing Unsupported: Able to stand 2 minutes with supervision Sitting with Back Unsupported but Feet Supported on Floor or Stool: Able to sit safely and securely 2 minutes Stand to Sit: Sits safely  with minimal use of hands Transfers: Able to transfer safely, minor use of hands Standing Unsupported with Eyes Closed: Able to stand 10 seconds with supervision Standing Ubsupported with Feet Together: Able to place feet together independently and stand for 1 minute with supervision From Standing, Reach Forward with Outstretched Arm: Can reach forward >5 cm safely (2") From Standing Position, Pick up Object from Floor: Able to pick up shoe safely and easily From Standing Position, Turn to Look Behind Over each Shoulder: Looks behind one side only/other side shows less weight shift Turn 360 Degrees: Able to turn 360 degrees safely but slowly Standing Unsupported, Alternately Place Feet on Step/Stool: Able to stand independently and safely and complete 8 steps in 20 seconds Standing Unsupported, One Foot in Front: Able to plae foot ahead of the other independently and hold 30 seconds Standing on One Leg: Tries to lift leg/unable to hold 3 seconds but remains standing independently Total Score: 43    Therapy/Group: Individual Therapy   Peter Congo, PT, DPT, CSRS  10/02/2020, 3:50 PM

## 2020-10-02 NOTE — Progress Notes (Signed)
Speech Language Pathology Daily Session Note  Patient Details  Name: Joseph Ayala MRN: [id number] Date of Birth: 1966-06-28  Today's Date: 10/02/2020 SLP Individual Time: 7793-9030 SLP Individual Time Calculation (min): 35 min  Short Term Goals: Week 1: SLP Short Term Goal 1 (Week 1): STG=LTG due to estimated length of stay  Skilled Therapeutic Interventions:  Pt was seen for skilled ST targeting dysphagia goals.  Pt was in bed upon therapist's arrival, awake, alert, and agreeable to participating in treatment.  Pt's breakfast tray was present and pt was noted to have eaten ~25% of meal prior to therapist's arrival.  Pt reports excellent toleration of recent diet advancement with no complaints of coughing, choking, difficulty chewing, or globus sensation.  Pt exhibited no overt s/s of aspiration with solids or liquids during breakfast, although his observed intake was minimal as pt reports he was already full from what he had eaten prior to therapist's arrival.  Pt was also observed taking medications during morning med pass.  He consumed all medications at one time with sips of thin liquids and exhibited no overt s/s of aspiration.  Pt remains afebrile, O2 sats are [facility] on room air, and lung sounds are documented as clear in nursing bedside assessment.  Recommend that pt remain on his currently prescribed diet with intermittent supervision for use of swallowing precautions.  Session was ended early today given that pt had already finished his breakfast and politely declined additional POs.  Pt was left in bed with bed alarm set and call bell within reach.  Continue per current plan of care.    Pain Pain Assessment Pain Scale: Faces Pain Score: 0-No pain Faces Pain Scale: Hurts little more Pain Type: Neuropathic pain Pain Location: Foot Pain Orientation: Right;Left Pain Descriptors / Indicators: Burning Pain Frequency: Constant Pain Onset: On-going Pain Intervention(s): RN made  aware  Therapy/Group: Individual Therapy  Page, Melanee Spry 10/02/2020, 8:47 AM

## 2020-10-02 NOTE — Plan of Care (Signed)
  Problem: Consults Goal: RH GENERAL PATIENT EDUCATION Description: See Patient Education module for education specifics. Outcome: Progressing Goal: Skin Care Protocol Initiated - if Braden Score 18 or less Description: If consults are not indicated, leave blank or document N/A Outcome: Progressing   Problem: RH BOWEL ELIMINATION Goal: RH STG MANAGE BOWEL WITH ASSISTANCE Description: STG Manage Bowel with Assistance. Outcome: Progressing Goal: RH STG MANAGE BOWEL W/MEDICATION W/ASSISTANCE Description: STG Manage Bowel with Medication with Assistance. Outcome: Progressing   Problem: RH BLADDER ELIMINATION Goal: RH STG MANAGE BLADDER WITH ASSISTANCE Description: STG Manage Bladder With Assistance Outcome: Progressing   Problem: RH SKIN INTEGRITY Goal: RH STG SKIN FREE OF INFECTION/BREAKDOWN Outcome: Progressing Goal: RH STG MAINTAIN SKIN INTEGRITY WITH ASSISTANCE Description: STG Maintain Skin Integrity With Assistance. Outcome: Progressing Goal: RH STG ABLE TO PERFORM INCISION/WOUND CARE W/ASSISTANCE Description: STG Able To Perform Incision/Wound Care With Assistance. Outcome: Progressing   Problem: RH SAFETY Goal: RH STG ADHERE TO SAFETY PRECAUTIONS W/ASSISTANCE/DEVICE Description: STG Adhere to Safety Precautions With Assistance/Device. Outcome: Progressing Goal: RH STG DECREASED RISK OF FALL WITH ASSISTANCE Description: STG Decreased Risk of Fall With Assistance. Outcome: Progressing   Problem: RH PAIN MANAGEMENT Goal: RH STG PAIN MANAGED AT OR BELOW PT'S PAIN GOAL Outcome: Progressing   Problem: RH KNOWLEDGE DEFICIT GENERAL Goal: RH STG INCREASE KNOWLEDGE OF SELF CARE AFTER HOSPITALIZATION Outcome: Progressing   

## 2020-10-03 DIAGNOSIS — M792 Neuralgia and neuritis, unspecified: Secondary | ICD-10-CM

## 2020-10-03 DIAGNOSIS — R1310 Dysphagia, unspecified: Secondary | ICD-10-CM

## 2020-10-03 LAB — CBC
HCT: 27.7 % — ABNORMAL LOW (ref 39.0–52.0)
Hemoglobin: 9.4 g/dL — ABNORMAL LOW (ref 13.0–17.0)
MCH: 30.9 pg (ref 26.0–34.0)
MCHC: 33.9 g/dL (ref 30.0–36.0)
MCV: 91.1 fL (ref 80.0–100.0)
Platelets: 224 10*3/uL (ref 150–400)
RBC: 3.04 MIL/uL — ABNORMAL LOW (ref 4.22–5.81)
RDW: 16.1 % — ABNORMAL HIGH (ref 11.5–15.5)
WBC: 8.3 10*3/uL (ref 4.0–10.5)
nRBC: 0 % (ref 0.0–0.2)

## 2020-10-03 LAB — COMPREHENSIVE METABOLIC PANEL
ALT: 81 U/L — ABNORMAL HIGH (ref 0–44)
AST: 95 U/L — ABNORMAL HIGH (ref 15–41)
Albumin: 2.8 g/dL — ABNORMAL LOW (ref 3.5–5.0)
Alkaline Phosphatase: 90 U/L (ref 38–126)
Anion gap: 7 (ref 5–15)
BUN: 9 mg/dL (ref 6–20)
CO2: 31 mmol/L (ref 22–32)
Calcium: 9.6 mg/dL (ref 8.9–10.3)
Chloride: 97 mmol/L — ABNORMAL LOW (ref 98–111)
Creatinine, Ser: 0.53 mg/dL — ABNORMAL LOW (ref 0.61–1.24)
GFR, Estimated: >60 mL/min (ref >60)
Glucose, Bld: 118 mg/dL — ABNORMAL HIGH (ref 70–99)
Potassium: 3.9 mmol/L (ref 3.5–5.1)
Sodium: 135 mmol/L (ref 135–145)
Total Bilirubin: 0.5 mg/dL (ref 0.3–1.2)
Total Protein: 5.9 g/dL — ABNORMAL LOW (ref 6.5–8.1)

## 2020-10-03 MED ORDER — GABAPENTIN 300 MG PO CAPS
600.0000 mg | ORAL_CAPSULE | Freq: Three times a day (TID) | ORAL | Status: DC
  Administered 2020-10-03 – 2020-10-05 (×8): 600 mg via ORAL
  Filled 2020-10-03 (×8): qty 2

## 2020-10-03 MED ORDER — AMITRIPTYLINE HCL 10 MG PO TABS
10.0000 mg | ORAL_TABLET | Freq: Every day | ORAL | Status: DC
  Administered 2020-10-03 – 2020-10-04 (×2): 10 mg via ORAL
  Filled 2020-10-03 (×3): qty 1

## 2020-10-03 NOTE — Progress Notes (Addendum)
[facility] PHYSICAL MEDICINE & REHABILITATION PROGRESS NOTE  Subjective/Complaints: Patient seen laying in bed this morning.  He states he did not sleep well overnight due to neuropathic pain in bilateral feet.  Discussed pain as well as intervention with therapies.  Per therapies, patient notes 11/10 out of pain.  Patient is going to try heat pack today and notes some benefit.  ROS: + Neuropathic pain in bilateral feet.  Denies CP, SOB, N/V/D  Objective: Vital Signs: Blood pressure 113/71, pulse 96, temperature 98.1 F (36.7 C), resp. rate 18, SpO2 96 %. No results found. Recent Labs    10/03/20 0702  WBC 8.3  HGB 9.4*  HCT 27.7*  PLT 224   Recent Labs    10/03/20 0702  NA 135  K 3.9  CL 97*  CO2 31  GLUCOSE 118*  BUN 9  CREATININE 0.53*  CALCIUM 9.6    Intake/Output Summary (Last 24 hours) at 10/03/2020 8101 Last data filed at 10/03/2020 0900 Gross per 24 hour  Intake 717 ml  Output 1100 ml  Net -383 ml     Pressure Injury 09/07/20 Buttocks Right;Left Stage 2 -  Partial thickness loss of dermis presenting as a shallow open injury with a red, pink wound bed without slough. one stage 2 on each buttock (Active)  09/07/20 1744  Location: Buttocks  Location Orientation: Right;Left  Staging: Stage 2 -  Partial thickness loss of dermis presenting as a shallow open injury with a red, pink wound bed without slough.  Wound Description (Comments): one stage 2 on each buttock  Present on Admission: No    Physical Exam: BP 113/71 (BP Location: Left Arm)   Pulse 96   Temp 98.1 F (36.7 C)   Resp 18   SpO2 96%  Constitutional: No distress . Vital signs reviewed. HENT: Normocephalic.  Atraumatic. Eyes: EOMI. No discharge. Cardiovascular: No JVD.  RRR. Respiratory: Normal effort.  No stridor.  Bilateral clear to auscultation. GI: Non-distended.  BS +. Skin: Warm and dry.  Intact. Psych: Normal mood.  Normal behavior. Musc: No edema in extremities.  No tenderness in  extremities. Neuro: Alert Motor: Motor: 4 --4/5 throughout, unchanged Sensation diminished to light touch in bilateral lower extremities  Assessment/Plan: 1. Functional deficits which require 3+ hours per day of interdisciplinary therapy in a comprehensive inpatient rehab setting.  Physiatrist is providing close team supervision and 24 hour management of active medical problems listed below.  Physiatrist and rehab team continue to assess barriers to discharge/monitor patient progress toward functional and medical goals   Care Tool:  Bathing    Body parts bathed by patient: Right arm,Left arm,Chest,Abdomen,Front perineal area,Buttocks,Right upper leg,Left upper leg,Right lower leg,Left lower leg,Face         Bathing assist Assist Level: Minimal Assistance - Patient > 75%     Upper Body Dressing/Undressing Upper body dressing   What is the patient wearing?: Pull over shirt    Upper body assist Assist Level: Minimal Assistance - Patient > 75% (standing)    Lower Body Dressing/Undressing Lower body dressing      What is the patient wearing?: Underwear/pull up,Pants     Lower body assist Assist for lower body dressing: Minimal Assistance - Patient > 75%     Toileting Toileting    Toileting assist Assist for toileting: Minimal Assistance - Patient > 75%     Transfers Chair/bed transfer  Transfers assist     Chair/bed transfer assist level: Contact Guard/Touching assist  Locomotion Ambulation   Ambulation assist      Assist level: Minimal Assistance - Patient > 75% Assistive device: Walker-rolling Max distance: 160'   Walk 10 feet activity   Assist     Assist level: Contact Guard/Touching assist Assistive device: Walker-rolling   Walk 50 feet activity   Assist    Assist level: Contact Guard/Touching assist Assistive device: Walker-rolling    Walk 150 feet activity   Assist    Assist level: Minimal Assistance - Patient >  75% Assistive device: Walker-rolling    Walk 10 feet on uneven surface  activity   Assist Walk 10 feet on uneven surfaces activity did not occur: Safety/medical concerns         Wheelchair     Assist Will patient use wheelchair at discharge?: No Type of Wheelchair: Manual    Wheelchair assist level: Supervision/Verbal cueing Max wheelchair distance: 50    Wheelchair 50 feet with 2 turns activity    Assist        Assist Level: Supervision/Verbal cueing   Wheelchair 150 feet activity     Assist  Wheelchair 150 feet activity did not occur: Safety/medical concerns        Medical Problem List and Plan: 1.  Debility secondary to necrotizing pneumonia, multiple ventilation episodes/trach and anasarca   Continue CIR 2.  PAF/Antithrombotics: -DVT/anticoagulation:  Pharmaceutical: Other (comment)--Eliquis.              -antiplatelet therapy: N/A 3. Pain Management: Continue Suboxone bid             Increased gabapentin to 300 mg tid for neuropathy, increased to 600 3 times daily on 3/28             States lidocaine patches exacerbated pain  Continue capsaicin cream.   Trial of heat packs on 3/25 for bilateral feet, patient may try if desired, with benefit  Discussed TENS with therapies  Amitriptyline 10 nightly started on 3/28  ?Zyvox side effect  Controlled with meds on 3/26 4. Mood: LCSW to follow for evaluation and support.              -antipsychotic agents: N/A 5. Neuropsych: This patient is? capable of making decisions on his own behalf. 6. Skin/Wound Care: Routine pressure relief measures.  7. Fluids/Electrolytes/Nutrition: Monitor I/Os 8. MRSA Necrotizing PNA: Continue Zyvox through 4/6, will consider discussion with ID if no improvement 9. 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 MTX did NOT work due to side effects 10. Delirium: Encephalopathy resolving  Continue Seroquel bid at present, will plan to wean 11. PAF/Tachycardia: Monitor HR  tid--continue metoprolol and Eliquis bid.  Controlled on 3/28  Monitor with increased activity 12. Alcohol abuse: Has completed librium taper.   Continue Seroquel and Klonopin bid.             Continue Folic acid/thiamine 13. HTN: Monitor BP tid--on lasix and low dose metoprolol.   Relatively controlled on 3/28  Monitor with increased mobility 14. Anemia of chronic disease?: At baseline of 10. Monitor for signs of bleeding.   Hemoglobin 9.4 on 3/28  Continue to monitor 15.  Transaminitis:  LFTs elevated, but improving on 3/28, continue to monitor 16.  Hyponatremia  Sodium 135 on 3/28 17. Dysphagia  Regular thins, intermittent supervision  LOS: 4 days A FACE TO FACE EVALUATION WAS PERFORMED  Joseph Ayala 10/03/2020, 9:21 AM

## 2020-10-03 NOTE — Progress Notes (Signed)
Physical Therapy Session Note  Patient Details  Name: Joseph Ayala MRN: [id number] Date of Birth: [birth year]  Today's Date: 10/03/2020 PT Individual Time: 0800-0830 PT Individual Time Calculation (min): 30 min  and Today's Date: 10/03/2020 PT Missed Time: 45 Minutes Missed Time Reason: Patient unwilling to participate;Pain  Short Term Goals: Week 1:  PT Short Term Goal 1 (Week 1): STG=LTG due to ELOS  Skilled Therapeutic Interventions/Progress Updates:    Patient received supine in bed, not agreeable to PT. He reports "11/10" pain in B feet related to neuropathic pain. Patient consistently writhing in pain in the bed, appearing sweaty and distracted by pain. He states that he "doesn't want to get out of bed until the doctor figures this pain out." Per RN, he received all of his morning rx, including pain rx. PT educating patient on non-pharmacological pain interventions including heat, compression and use of TENS. Despite education on importance of OOB therapy to progress toward LTGs for safe dc home, patient refusing to participate in further therapy. Patient remained in bed with hot packs on B feet. PT instructing patient and RN to remove hot packs if they began to feel too hot. Patient verbalized understanding. Bed alarm on, call light within reach.   Therapy Documentation Precautions:  Precautions Precautions: Fall Restrictions Weight Bearing Restrictions: No   Therapy/Group: Individual Therapy  Elizebeth Koller, PT, DPT, CBIS  10/03/2020, 7:34 AM

## 2020-10-03 NOTE — Progress Notes (Signed)
Speech Language Pathology Discharge Summary  Patient Details  Name: Joseph Ayala MRN: [id number] Date of Birth: 1965/09/29  Today's Date: 10/03/2020 SLP Individual Time: 1345-1415 SLP Individual Time Calculation (min): 30 min   Skilled Therapeutic Interventions:  Skilled SLP intervention focused on diet tolerance. Pt in a lot of pain when slp arrived but agreeable to trials of regular solids. He demonstrated adequate mastication, timely swallow reflex, and adequate oral clearance.No overt s/sx of aspiration or penetration noted this session. Recommend dc from skilled SLP intervention. Continue with regular solids and thin liquids. Pt demonstrated good understanding of safe swallow precautions and safe swallow strategies. DC from skilled SLP intervention. Session ended early due to patient in pain and refusing any further po trials.   Patient has met 1 of 1 long term goals.  Patient to discharge at overall Modified Independent level.  Reasons goals not met:     Clinical Impression/Discharge Summary:  Pt has met all goals at this time for dysphagia. SLP tx was recommended to assess pt tolerance with regular solids to ensure he is tolerating well. He demonstrates good understanding of swallow strategies to continue to minimize aspiration risk.  He is tolerating regular solids and thin liquids with no overt s/sx of aspiration or penetration across multiple sessions.He demonstrates adequate awareness of physical deficits and safety awareness during meals.         Recommendation:  24 hour supervision/assistance;None      Equipment: none   Reasons for discharge: Treatment goals met   Patient/Family Agrees with Progress Made and Goals Achieved: Yes    Darrol Poke [name] 10/03/2020, 2:18 PM

## 2020-10-03 NOTE — Progress Notes (Signed)
Occupational Therapy Session Note  Patient Details  Name: Joseph Ayala MRN: [id number] Date of Birth: 04/25/1966  Today's Date: 10/03/2020 OT Individual Time: 0930-1000 OT Individual Time Calculation (min): 30 min    Short Term Goals: Week 1:  OT Short Term Goal 1 (Week 1): STG=LTG d/t ELOS  Skilled Therapeutic Interventions/Progress Updates:    Patient in bed, sleepy but agrees to participate in therapy session.  Reviewed stretching and pain control options at bed level, bed mobility with CS.  Sit to stand and ambulation with RW to/from bed, toilet and w/c CS/CGA.  toileting completed in stance with CS.  Able to stand to complete oral care at sink with CS.  Standing mobility and stretching activities in stance with CS and cues for safety.  He notes ongoing pain/burning in bilateral feet - somewhat relieved with stretching and mobility.  He remained seated in w/c at close of session, seat belt alarm set and call bell in reach.    Therapy Documentation Precautions:  Precautions Precautions: Fall Restrictions Weight Bearing Restrictions: No   Therapy/Group: Individual Therapy  Barrie Lyme 10/03/2020, 7:34 AM

## 2020-10-03 NOTE — Plan of Care (Signed)
  Problem: Consults Goal: RH GENERAL PATIENT EDUCATION Description: See Patient Education module for education specifics. Outcome: Progressing Goal: Skin Care Protocol Initiated - if Braden Score 18 or less Description: If consults are not indicated, leave blank or document N/A Outcome: Progressing   Problem: RH BOWEL ELIMINATION Goal: RH STG MANAGE BOWEL WITH ASSISTANCE Description: STG Manage Bowel with Assistance. Outcome: Progressing Goal: RH STG MANAGE BOWEL W/MEDICATION W/ASSISTANCE Description: STG Manage Bowel with Medication with Assistance. Outcome: Progressing   Problem: RH BLADDER ELIMINATION Goal: RH STG MANAGE BLADDER WITH ASSISTANCE Description: STG Manage Bladder With Assistance Outcome: Progressing   Problem: RH SKIN INTEGRITY Goal: RH STG SKIN FREE OF INFECTION/BREAKDOWN Outcome: Progressing Goal: RH STG MAINTAIN SKIN INTEGRITY WITH ASSISTANCE Description: STG Maintain Skin Integrity With Assistance. Outcome: Progressing Goal: RH STG ABLE TO PERFORM INCISION/WOUND CARE W/ASSISTANCE Description: STG Able To Perform Incision/Wound Care With Assistance. Outcome: Progressing   Problem: RH SAFETY Goal: RH STG ADHERE TO SAFETY PRECAUTIONS W/ASSISTANCE/DEVICE Description: STG Adhere to Safety Precautions With Assistance/Device. Outcome: Progressing Goal: RH STG DECREASED RISK OF FALL WITH ASSISTANCE Description: STG Decreased Risk of Fall With Assistance. Outcome: Progressing   Problem: RH PAIN MANAGEMENT Goal: RH STG PAIN MANAGED AT OR BELOW PT'S PAIN GOAL Outcome: Progressing   Problem: RH KNOWLEDGE DEFICIT GENERAL Goal: RH STG INCREASE KNOWLEDGE OF SELF CARE AFTER HOSPITALIZATION Outcome: Progressing   

## 2020-10-04 MED ORDER — QUETIAPINE FUMARATE 50 MG PO TABS
50.0000 mg | ORAL_TABLET | Freq: Every day | ORAL | Status: DC
  Administered 2020-10-04 – 2020-10-05 (×2): 50 mg via ORAL
  Filled 2020-10-04 (×2): qty 1

## 2020-10-04 NOTE — Progress Notes (Signed)
Physical Therapy Session Note  Patient Details  Name: Joseph Ayala MRN: [id number] Date of Birth: Mar 01, 1966  Today's Date: 10/04/2020 PT Individual Time:Session1: 0930-1000; Session2: 1105-1200 PT Individual Time Calculation (min): 30 min & 55 min  Short Term Goals: Week 1:  PT Short Term Goal 1 (Week 1): STG=LTG due to ELOS  Skilled Therapeutic Interventions/Progress Updates:    Session1: Patient in w/c in room and reports ready for therapy without much pain this am.  Performed sit to stand with S and ambulation with RW to therapy gym x 150' and pt negotiate 4 steps with rails and S.  Patient performed wall slides with ball behind back 2 x 5 reps, rolling ball up the wall for postural stretch 5 x with 3 sec hold at top.  Supine on mat bridging over ball with 5 sec hold x 10, long trunk rotation with feet on ball x 10, sidelying clamshell hip abduction x 10 with green t-band, and quadruped alternate opposite arm/leg lift over physioball x 5 each.  Patient ambulated to room and returned to supine left with call bell/needs in reach and bed alarm active.   Session2:  Patient in supine and frustrated with communication with his wife.  Encouraged throughout session that health care crises can be difficult on relationships and to have patience and realize the stress.  Patient ambulated without device with CGA to S to ortho gym.  Performed side stepping at counter, with S then crossing over in front, then behind, then alternating.  Patient performed 6 minute walk test with one seated rest completing 46' without device, with CGA to S with broad based gait, with foot slap occasionally and knee flexion, hip ER.  During seated rest c/o thighs fatigued. Patient ambulated to gym and supine rest with c/o back pain.  Education regarding fall prevention, fall recovery with practice on floor mat after demonstration with S and cues to return to seated on sturdy piece of furniture, scooting close if not in proximity.   Also discussed not getting up if injured and to call wife for supervision.  Patient ambulated to ortho gym and performed 6 minutes on Nu Step at level 3 with UE/LE's. Returned to room and left in supine with call bell in reach and bed alarm active.  RN informed need for medication and allowed me to place cream in the room on his feet.   Therapy Documentation Precautions:  Precautions Precautions: Fall Restrictions Weight Bearing Restrictions: No Pain: Pain Assessment Pain Score: 6  Pain Type: Neuropathic pain Pain Location: Toe (Comment which one) Pain Orientation: Right;Left Pain Descriptors / Indicators: Burning Pain Onset: On-going Pain Intervention(s): Repositioned;Ambulation/increased activity;Distraction   Therapy/Group: Individual Therapy  Elray Mcgregor  [place], PT 10/04/2020, 9:04 AM

## 2020-10-04 NOTE — Progress Notes (Signed)
Patient ID: Joseph Ayala, male   DOB: 08/05/1965, 55 y.o.   MRN: [id number] Received phone call from patient's wife regarding training for discharge. Patient had called and told her "she was late to training session" and she had not been made aware of the need to be present for training. Followed up with therapy staff to review family education training and confirm times/dates for wife to come in to practice in prep for discharge. SW/CM to follow up with wife once fam educ. is confirmed. Therapy staff to follow up with the patient to clear confusion of information communicated with him on family training sessions. Pamelia Hoit

## 2020-10-04 NOTE — Progress Notes (Signed)
Joseph Ayala PHYSICAL MEDICINE & REHABILITATION PROGRESS NOTE  Subjective/Complaints: Patient seen ambulating with therapy this morning.  He states he had some discomfort between 8-10 PM due to not receiving all of his medications at the same time, but pain improved thereafter he was able to sleep.  He was working with therapy this morning and noted to have improvement with pain and function, discussed with therapies.  He notes improvement with medication change.  ROS: Denies CP, SOB, N/V/D  Objective: Vital Signs: Blood pressure 96/64, pulse 67, temperature 97.6 F (36.4 C), resp. rate 17, SpO2 97 %. No results found. Recent Labs    10/03/20 0702  WBC 8.3  HGB 9.4*  HCT 27.7*  PLT 224   Recent Labs    10/03/20 0702  NA 135  K 3.9  CL 97*  CO2 31  GLUCOSE 118*  BUN 9  CREATININE 0.53*  CALCIUM 9.6    Intake/Output Summary (Last 24 hours) at 10/04/2020 1213 Last data filed at 10/04/2020 0856 Gross per 24 hour  Intake 1060 ml  Output 1550 ml  Net -490 ml     Pressure Injury 09/07/20 Buttocks Right;Left Stage 2 -  Partial thickness loss of dermis presenting as a shallow open injury with a red, pink wound bed without slough. one stage 2 on each buttock (Active)  09/07/20 1744  Location: Buttocks  Location Orientation: Right;Left  Staging: Stage 2 -  Partial thickness loss of dermis presenting as a shallow open injury with a red, pink wound bed without slough.  Wound Description (Comments): one stage 2 on each buttock  Present on Admission: No    Physical Exam: BP 96/64 (BP Location: Right Arm)   Pulse 67   Temp 97.6 F (36.4 C)   Resp 17   SpO2 97%  Constitutional: No distress . Vital signs reviewed. HENT: Normocephalic.  Atraumatic. Eyes: EOMI. No discharge. Cardiovascular: No JVD.  RRR. Respiratory: Normal effort.  No stridor.  Bilateral clear to auscultation. GI: Non-distended.  BS +. Skin: Warm and dry.  Intact. Psych: Normal mood.  Normal behavior. Musc:  No edema in extremities.  No tenderness in extremities. Neuro: Alert Motor: Motor: 4 --4/5 throughout, stable Sensation diminished to light touch in bilateral lower extremities  Assessment/Plan: 1. Functional deficits which require 3+ hours per day of interdisciplinary therapy in a comprehensive inpatient rehab setting.  Physiatrist is providing close team supervision and 24 hour management of active medical problems listed below.  Physiatrist and rehab team continue to assess barriers to discharge/monitor patient progress toward functional and medical goals   Care Tool:  Bathing    Body parts bathed by patient: Right arm,Left arm,Chest,Abdomen,Front perineal area,Buttocks,Right upper leg,Left upper leg,Right lower leg,Left lower leg,Face         Bathing assist Assist Level: Supervision/Verbal cueing     Upper Body Dressing/Undressing Upper body dressing   What is the patient wearing?: Pull over shirt    Upper body assist Assist Level: Set up assist    Lower Body Dressing/Undressing Lower body dressing      What is the patient wearing?: Underwear/pull up,Pants     Lower body assist Assist for lower body dressing: Supervision/Verbal cueing     Toileting Toileting    Toileting assist Assist for toileting: Supervision/Verbal cueing     Transfers Chair/bed transfer  Transfers assist     Chair/bed transfer assist level: Contact Guard/Touching assist     Locomotion Ambulation   Ambulation assist      Assist  level: Minimal Assistance - Patient > 75% Assistive device: Walker-rolling Max distance: 160'   Walk 10 feet activity   Assist     Assist level: Contact Guard/Touching assist Assistive device: Walker-rolling   Walk 50 feet activity   Assist    Assist level: Contact Guard/Touching assist Assistive device: Walker-rolling    Walk 150 feet activity   Assist    Assist level: Minimal Assistance - Patient > 75% Assistive device:  Walker-rolling    Walk 10 feet on uneven surface  activity   Assist Walk 10 feet on uneven surfaces activity did not occur: Safety/medical concerns         Wheelchair     Assist Will patient use wheelchair at discharge?: No Type of Wheelchair: Manual    Wheelchair assist level: Supervision/Verbal cueing Max wheelchair distance: 50    Wheelchair 50 feet with 2 turns activity    Assist        Assist Level: Supervision/Verbal cueing   Wheelchair 150 feet activity     Assist  Wheelchair 150 feet activity did not occur: Safety/medical concerns        Medical Problem List and Plan: 1.  Debility secondary to necrotizing pneumonia, multiple ventilation episodes/trach and anasarca   Continue CIR 2.  PAF/Antithrombotics: -DVT/anticoagulation:  Pharmaceutical: Other (comment)--Eliquis.              -antiplatelet therapy: N/A 3. Pain Management: Continue Suboxone bid             Increased gabapentin to 300 mg tid for neuropathy, increased to 600 3 times daily on 3/28             States lidocaine patches exacerbated pain  Continue capsaicin cream.   Trial of heat packs on 3/25 for bilateral feet, patient may try if desired, with benefit  Discussed TENS with therapies  Amitriptyline 10 nightly started on 3/28  ?Zyvox side effect  Controlled with meds on 3/29 4. Mood: LCSW to follow for evaluation and support.              -antipsychotic agents: N/A 5. Neuropsych: This patient is? capable of making decisions on his own behalf. 6. Skin/Wound Care: Routine pressure relief measures.  7. Fluids/Electrolytes/Nutrition: Monitor I/Os 8. MRSA Necrotizing PNA: Continue Zyvox through 4/6, will consider discussion with ID if no improvement 9. 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 MTX did NOT work due to side effects 10. Delirium: Encephalopathy resolving  Seroquel decreased to 50 twice daily on 3/29 11. PAF/Tachycardia: Monitor HR tid--continue metoprolol and  Eliquis bid.  Controlled on 3/29  Monitor with increased activity 12. Alcohol abuse: Has completed librium taper.   Continue Klonopin bid.             Continue Folic acid/thiamine 13. HTN: Monitor BP tid--on lasix and low dose metoprolol.   Relatively controlled on 3/29  Monitor with increased mobility 14. Anemia of chronic disease?: At baseline of 10. Monitor for signs of bleeding.   Hemoglobin 9.4 on 3/28  Continue to monitor 15.  Transaminitis:  LFTs elevated, but improving on 3/28, continue to monitor 16.  Hyponatremia  Sodium 135 on 3/28 17.  Dysphagia  Regular thins, intermittent supervision  LOS: 5 days A FACE TO FACE EVALUATION WAS PERFORMED  Joseph Ayala Karis Juba 10/04/2020, 12:13 PM

## 2020-10-04 NOTE — Progress Notes (Signed)
Patient ID: Joseph Ayala, male   DOB: [birth year], 55 y.o.   MRN: [id number]  Spoke with wife via telephone to schedule family education for tomorrow at 10:00-12:00. Wife will be here and has questions regarding getting weights for pt and being prepared for him to come home.

## 2020-10-04 NOTE — Plan of Care (Signed)
  Problem: Consults Goal: RH GENERAL PATIENT EDUCATION Description: See Patient Education module for education specifics. Outcome: Progressing Goal: Skin Care Protocol Initiated - if Braden Score 18 or less Description: If consults are not indicated, leave blank or document N/A Outcome: Progressing   Problem: RH BOWEL ELIMINATION Goal: RH STG MANAGE BOWEL WITH ASSISTANCE Description: STG Manage Bowel with Assistance. Outcome: Progressing Goal: RH STG MANAGE BOWEL W/MEDICATION W/ASSISTANCE Description: STG Manage Bowel with Medication with Assistance. Outcome: Progressing   Problem: RH BLADDER ELIMINATION Goal: RH STG MANAGE BLADDER WITH ASSISTANCE Description: STG Manage Bladder With Assistance Outcome: Progressing   Problem: RH SKIN INTEGRITY Goal: RH STG SKIN FREE OF INFECTION/BREAKDOWN Outcome: Progressing Goal: RH STG MAINTAIN SKIN INTEGRITY WITH ASSISTANCE Description: STG Maintain Skin Integrity With Assistance. Outcome: Progressing Goal: RH STG ABLE TO PERFORM INCISION/WOUND CARE W/ASSISTANCE Description: STG Able To Perform Incision/Wound Care With Assistance. Outcome: Progressing   Problem: RH SAFETY Goal: RH STG ADHERE TO SAFETY PRECAUTIONS W/ASSISTANCE/DEVICE Description: STG Adhere to Safety Precautions With Assistance/Device. Outcome: Progressing Goal: RH STG DECREASED RISK OF FALL WITH ASSISTANCE Description: STG Decreased Risk of Fall With Assistance. Outcome: Progressing   Problem: RH PAIN MANAGEMENT Goal: RH STG PAIN MANAGED AT OR BELOW PT'S PAIN GOAL Outcome: Progressing   Problem: RH KNOWLEDGE DEFICIT GENERAL Goal: RH STG INCREASE KNOWLEDGE OF SELF CARE AFTER HOSPITALIZATION Outcome: Progressing

## 2020-10-04 NOTE — Progress Notes (Signed)
Occupational Therapy Session Note  Patient Details  Name: Joseph Ayala MRN: [id number] Date of Birth: 03/30/1966  Today's Date: 10/04/2020 OT Individual Time: 0800-0900 OT Individual Time Calculation (min): 60 min    Short Term Goals: Week 1:  OT Short Term Goal 1 (Week 1): STG=LTG d/t ELOS  Skilled Therapeutic Interventions/Progress Updates:    Patient seated edge of bed, with nursing, he notes that pain in feet is significantly improved from yesterday.  Sit to stand and ambulation without AD to/from bed, shower seat, toilet, w/c CS occ cues for safety.  Completed shower in seated position with hand held shower and grab bars CS/set up.  Dressing completed seated edge of bed with set up/CS.  Grooming tasks in stance at sink with CS.   Able to ambulate to/from therapy gym with CS.  Completed visual motor/balance/coordination activities using BITS system in stance with both hands :  Reaction time - 2 min = 1.11 sec              Saccades (# 1-40)  = 1.56 sec              Saccades (alternating #/letters)  = 4.15 sec   - no LOB, good attention to task and accuracy Returned to w/c at close of session, seat belt alarm set and call bell/tray table in reach.    Therapy Documentation Precautions:  Precautions Precautions: Fall Restrictions Weight Bearing Restrictions: No  Therapy/Group: Individual Therapy  Barrie Lyme 10/04/2020, 7:39 AM

## 2020-10-05 ENCOUNTER — Other Ambulatory Visit (HOSPITAL_COMMUNITY): Payer: Self-pay | Admitting: Physical Medicine and Rehabilitation

## 2020-10-05 LAB — FUNGUS CULTURE WITH STAIN

## 2020-10-05 LAB — FUNGUS CULTURE RESULT

## 2020-10-05 LAB — FUNGAL ORGANISM REFLEX

## 2020-10-05 MED ORDER — GABAPENTIN 300 MG PO CAPS
900.0000 mg | ORAL_CAPSULE | Freq: Three times a day (TID) | ORAL | Status: DC
Start: 2020-10-05 — End: 2020-10-06
  Administered 2020-10-05 – 2020-10-06 (×3): 900 mg via ORAL
  Filled 2020-10-05 (×3): qty 3

## 2020-10-05 MED ORDER — ACETAMINOPHEN 325 MG PO TABS: 325.0000 mg | ORAL_TABLET | ORAL | Status: DC | PRN

## 2020-10-05 MED ORDER — LINEZOLID 600 MG PO TABS: 600.0000 mg | ORAL_TABLET | Freq: Two times a day (BID) | ORAL | 0 refills | Status: DC

## 2020-10-05 MED ORDER — TRAMADOL HCL 50 MG PO TABS
50.0000 mg | ORAL_TABLET | Freq: Four times a day (QID) | ORAL | Status: DC | PRN
  Administered 2020-10-05 – 2020-10-06 (×2): 50 mg via ORAL
  Filled 2020-10-05 (×2): qty 1

## 2020-10-05 MED ORDER — FUROSEMIDE 40 MG PO TABS: 40.0000 mg | ORAL_TABLET | Freq: Every day | ORAL | 0 refills | Status: DC

## 2020-10-05 MED ORDER — NYSTATIN 100000 UNIT/GM EX POWD: Freq: Two times a day (BID) | CUTANEOUS | 0 refills | Status: DC

## 2020-10-05 MED ORDER — CLONAZEPAM 0.5 MG PO TABS: ORAL_TABLET | ORAL | 0 refills | Status: DC

## 2020-10-05 MED ORDER — METOPROLOL TARTRATE 25 MG PO TABS: 12.5000 mg | ORAL_TABLET | Freq: Two times a day (BID) | ORAL | 0 refills | Status: DC

## 2020-10-05 MED ORDER — APIXABAN 5 MG PO TABS: 5.0000 mg | ORAL_TABLET | Freq: Two times a day (BID) | ORAL | 0 refills | Status: DC

## 2020-10-05 MED ORDER — GABAPENTIN 300 MG PO CAPS: 600.0000 mg | ORAL_CAPSULE | Freq: Three times a day (TID) | ORAL | 0 refills | Status: DC

## 2020-10-05 MED ORDER — HYDROCERIN EX CREA: 1.0000 "application " | TOPICAL_CREAM | Freq: Two times a day (BID) | CUTANEOUS | 0 refills | Status: DC

## 2020-10-05 MED ORDER — FOLIC ACID 1 MG PO TABS: 1.0000 mg | ORAL_TABLET | Freq: Every day | ORAL | 0 refills | Status: DC

## 2020-10-05 MED ORDER — CAPSAICIN 0.075 % EX CREA
TOPICAL_CREAM | Freq: Two times a day (BID) | CUTANEOUS | Status: DC
  Filled 2020-10-05: qty 60

## 2020-10-05 MED ORDER — CLONAZEPAM 0.5 MG PO TABS
0.5000 mg | ORAL_TABLET | Freq: Two times a day (BID) | ORAL | Status: DC
  Administered 2020-10-05 – 2020-10-06 (×2): 0.5 mg via ORAL
  Filled 2020-10-05 (×2): qty 1

## 2020-10-05 MED ORDER — DOCUSATE SODIUM 100 MG PO CAPS: 100.0000 mg | ORAL_CAPSULE | Freq: Two times a day (BID) | ORAL | 0 refills | Status: DC

## 2020-10-05 MED ORDER — QUETIAPINE FUMARATE 25 MG PO TABS: ORAL_TABLET | ORAL | 0 refills | Status: DC

## 2020-10-05 MED ORDER — AMITRIPTYLINE HCL 10 MG PO TABS: 10.0000 mg | ORAL_TABLET | Freq: Every day | ORAL | 0 refills | Status: DC

## 2020-10-05 MED ORDER — TRAZODONE HCL 50 MG PO TABS
25.0000 mg | ORAL_TABLET | Freq: Every evening | ORAL | 0 refills | Status: DC | PRN
Start: 2020-10-05 — End: 2020-10-27

## 2020-10-05 MED ORDER — CLOTRIMAZOLE 1 % EX CREA: TOPICAL_CREAM | Freq: Two times a day (BID) | CUTANEOUS | 0 refills | Status: DC

## 2020-10-05 MED ORDER — THIAMINE HCL 100 MG PO TABS: 100.0000 mg | ORAL_TABLET | Freq: Every day | ORAL | 0 refills | Status: DC

## 2020-10-05 MED FILL — LINEZOLID 600 MG TAB: 600 | 6 days supply | Qty: 13 | Fill #0

## 2020-10-05 MED FILL — FUROSEMIDE 40 MG TAB: 40 | 30 days supply | Qty: 30 | Fill #0

## 2020-10-05 MED FILL — clonazePAM 0.5 MG TABS: 0.5 | 14 days supply | Qty: 21 | Fill #0

## 2020-10-05 MED FILL — METOPROLOL TARTRATE 25 MG T: 25 | 30 days supply | Qty: 30 | Fill #0

## 2020-10-05 MED FILL — ELIQUIS 5 MG TABLET: 5 | 30 days supply | Qty: 60 | Fill #0

## 2020-10-05 NOTE — Plan of Care (Signed)
  Problem: RH Balance Goal: LTG Patient will maintain dynamic sitting balance (PT) Description: LTG:  Patient will maintain dynamic sitting balance with assistance during mobility activities (PT) Outcome: Completed/Met Goal: LTG Patient will maintain dynamic standing balance (PT) Description: LTG:  Patient will maintain dynamic standing balance with assistance during mobility activities (PT) Outcome: Completed/Met   Problem: Sit to Stand Goal: LTG:  Patient will perform sit to stand with assistance level (PT) Description: LTG:  Patient will perform sit to stand with assistance level (PT) Outcome: Completed/Met   Problem: RH Car Transfers Goal: LTG Patient will perform car transfers with assist (PT) Description: LTG: Patient will perform car transfers with assistance (PT). Outcome: Completed/Met   Problem: RH Furniture Transfers Goal: LTG Patient will perform furniture transfers w/assist (OT/PT) Description: LTG: Patient will perform furniture transfers  with assistance (OT/PT). Outcome: Completed/Met   Problem: RH Floor Transfers Goal: LTG Patient will perform floor transfers w/assist (PT) Description: LTG: Patient will perform floor transfers with assistance (PT). Outcome: Completed/Met   Problem: RH Ambulation Goal: LTG Patient will ambulate in controlled environment (PT) Description: LTG: Patient will ambulate in a controlled environment, # of feet with assistance (PT). Outcome: Completed/Met Goal: LTG Patient will ambulate in home environment (PT) Description: LTG: Patient will ambulate in home environment, # of feet with assistance (PT). Outcome: Completed/Met   Problem: RH Stairs Goal: LTG Patient will ambulate up and down stairs w/assist (PT) Description: LTG: Patient will ambulate up and down # of stairs with assistance (PT) Outcome: Completed/Met  Magda Kiel, PT

## 2020-10-05 NOTE — Patient Care Conference (Signed)
Inpatient RehabilitationTeam Conference and Plan of Care Update Date: 10/05/2020   Time: 11:27 AM    Patient Name: Joseph Ayala      Medical Record Number: [id number]  Date of Birth: 1966/02/28 Sex: Male         Room/Bed: 4M12C/4M12C-01 Payor Info: Payor: [employer] MEDICARE / Plan: [employer] HMO / Product Type: *No Product type* /    Admit Date/Time:  09/29/2020  2:06 PM  Primary Diagnosis:  [facility] Problems: Principal Problem:   Debility Active Problems:   Chronic pain syndrome   Necrotizing pneumonia (HCC)   Acute metabolic encephalopathy   Nerve pain   Hyponatremia   Transaminitis   Anemia of chronic disease   Essential hypertension   PAF (paroxysmal atrial fibrillation) ([place])   Neuropathic pain   Dysphagia    Expected Discharge Date: Expected Discharge Date: 10/06/20  Team Members Present: Physician leading conference: Dr. Delice Lesch Care Coodinator Present: Dorien Chihuahua, RN, BSN, CRRN;Becky Dupree, LCSW Nurse Present: Dorien Chihuahua, RN PT Present: Magda Kiel, PT OT Present: Elisabeth Most, OT PPS Coordinator present : Ileana Ladd, PT     Current Status/Progress Goal Weekly Team Focus  Bowel/Bladder   pt is continent of b/b, lbm 10/04/20  Pt will remain continent of b/b  q2h toileting   Swallow/Nutrition/ Hydration   d/c for ST tolerating regular textures and thin liquids, Mod I goal met         ADL's   self care and mobility supervision/set up level, occ cues for safety, foot pain improved  set up/supervision  family education and discharge prep   Mobility   supervision to [place] for ambulation up to 400', S steps with rails, S transfers including floor transfers, independent bed mobility  S overall  core and LE strength, balance, coordination, gait   Communication             Safety/Cognition/ Behavioral Observations            Pain   pt c/o generalized pain, PRNs effective  Pt will be free of pain  Assess pain qshift   Skin   Trach  site covered with gauze, pt states "my bottom is red, i will apply my own cream"  pt skin will be free of breakdown and infection  assess pain qshift/prn     Discharge Planning:  HOme with wife who is currently on FMLA and has five weeks left. Family education today-pt anxious to go home   Team Discussion: Pain managed. Continue abx through 10/12/20. Hgb low, encourage dietary modifications and MD to recheck labs.  Patient on target to meet rehab goals: yes, currently supervision level for self care and mod I when seated. Supervision with PT for transfers and steps.  *See Care Plan and progress notes for long and short-term goals.   Revisions to Treatment Plan:  SLP services discontinued  Teaching Needs: Family education completed 10/05/20  Current Barriers to Discharge: Decreased caregiver support  Possible Resolutions to Barriers: HH follow up services      Medical Summary Current Status: Debility secondary to necrotizing pneumonia, multiple ventilation episodes/trach and anasarca  Barriers to Discharge: Medical stability;Other (comments)  Barriers to Discharge Comments: Pain, hx of substance abuse Possible Resolutions to [employer] Focus: Therapies, follow labs - LFTs, Hb, optimize BP meds, cont abx, optimzie pain meds   Continued Need for Acute Rehabilitation Level of Care: The patient requires daily medical management by a physician with specialized training in physical medicine and rehabilitation  for the following reasons: Direction of a multidisciplinary physical rehabilitation program to maximize functional independence : Yes Medical management of patient stability for increased activity during participation in an intensive rehabilitation regime.: Yes Analysis of laboratory values and/or radiology reports with any subsequent need for medication adjustment and/or medical intervention. : Yes   I attest that I was present, lead the team conference, and concur with the  assessment and plan of the team.   Dorien Chihuahua B 10/05/2020, 4:04 PM

## 2020-10-05 NOTE — Progress Notes (Signed)
Patient ID: Joseph Ayala, male   DOB: July 12, 1965, 55 y.o.   MRN: [id number] Met with pt and wife who is here for family education today in preparation of discharge tomorrow. Team conference felt he was doing well goals of supervision level and he has reached this. Pt is really wanting to go home today but is ok with tomorrow. They have needed equipment if needs rolling walker, pt's Dad has one they can use and wife ordered tub seat for home. Discussed follow up both felt it would be beneficial to have home health to work on activities in the home and outside of the home. No pref just one who accepts [facility]. Will work toward discharge tomorrow.

## 2020-10-05 NOTE — Progress Notes (Signed)
Occupational Therapy Session Note  Patient Details  Name: Joseph Ayala MRN: [id number] Date of Birth: 10/28/1965  Today's Date: 10/05/2020 OT Individual Time: 1000-1045 OT Individual Time Calculation (min): 45 min    Short Term Goals: Week 1:  OT Short Term Goal 1 (Week 1): STG=LTG d/t ELOS  Skilled Therapeutic Interventions/Progress Updates:    Patient seated edge of bed, wife present for therapy session to participate in family education.  Reviewed sensory changed in feet - not limiting mobility at this time.  Reviewed patient's ability to perform self care tasks at supervision/set up level, safety concerns, DME, return to exercise and activities.  Patient and wife demonstrate good understanding and have a safe plan for transition to home.  Completed ambulation on unit with education for guarding/supervision, transfers at supervision level to various surfaces to include shower seat with step over threshold, ambulation up/down ramp, single step and mulch/uneven surface.  Reviewed energy conservation techniques, safety with pet care.  Patient and wife state that they feel ready for discharge.  Hand off to PT at end of session for ongoing therapy/education.    Therapy Documentation Precautions:  Precautions Precautions: Fall Restrictions Weight Bearing Restrictions: No   Therapy/Group: Individual Therapy  Barrie Lyme 10/05/2020, 7:43 AM

## 2020-10-05 NOTE — Progress Notes (Signed)
[place] PHYSICAL MEDICINE & REHABILITATION PROGRESS NOTE  Subjective/Complaints: Patient seen sitting up working with therapy this morning.  He states he slept well overnight.  He notes bilateral feet pain.  He is questions regarding improvement.  He now states that he does not have any benefit with heat on his feet.  He also notes no benefit with Lidoderm patch.  ROS: Denies CP, SOB, N/V/D  Objective: Vital Signs: Blood pressure 96/62, pulse 73, temperature 98 F (36.7 C), resp. rate 18, SpO2 100 %. No results found. Recent Labs    10/03/20 0702  WBC 8.3  HGB 9.4*  HCT 27.7*  PLT 224   Recent Labs    10/03/20 0702  NA 135  K 3.9  CL 97*  CO2 31  GLUCOSE 118*  BUN 9  CREATININE 0.53*  CALCIUM 9.6    Intake/Output Summary (Last 24 hours) at 10/05/2020 1011 Last data filed at 10/05/2020 0740 Gross per 24 hour  Intake 1560 ml  Output 800 ml  Net 760 ml     Pressure Injury 09/07/20 Buttocks Right;Left Stage 2 -  Partial thickness loss of dermis presenting as a shallow open injury with a red, pink wound bed without slough. one stage 2 on each buttock (Active)  09/07/20 1744  Location: Buttocks  Location Orientation: Right;Left  Staging: Stage 2 -  Partial thickness loss of dermis presenting as a shallow open injury with a red, pink wound bed without slough.  Wound Description (Comments): one stage 2 on each buttock  Present on Admission: No    Physical Exam: BP 96/62 (BP Location: Left Arm)   Pulse 73   Temp 98 F (36.7 C)   Resp 18   SpO2 100%  Constitutional: No distress . Vital signs reviewed. HENT: Normocephalic.  Atraumatic. Eyes: EOMI. No discharge. Cardiovascular: No JVD.  RRR. Respiratory: Normal effort.  No stridor.  Bilateral clear to auscultation. GI: Non-distended.  BS +. Skin: Warm and dry.  Intact. Psych: Normal mood.  Normal behavior. Musc: No edema in extremities.  No tenderness in extremities. Neuro: Alert Motor: Motor: 4 --4/5  throughout, unchanged Sensation diminished to light touch in bilateral lower extremities  Assessment/Plan: 1. Functional deficits which require 3+ hours per day of interdisciplinary therapy in a comprehensive inpatient rehab setting.  Physiatrist is providing close team supervision and 24 hour management of active medical problems listed below.  Physiatrist and rehab team continue to assess barriers to discharge/monitor patient progress toward functional and medical goals   Care Tool:  Bathing    Body parts bathed by patient: Right arm,Left arm,Chest,Abdomen,Front perineal area,Buttocks,Right upper leg,Left upper leg,Right lower leg,Left lower leg,Face         Bathing assist Assist Level: Supervision/Verbal cueing     Upper Body Dressing/Undressing Upper body dressing   What is the patient wearing?: Pull over shirt    Upper body assist Assist Level: Set up assist    Lower Body Dressing/Undressing Lower body dressing      What is the patient wearing?: Underwear/pull up,Pants     Lower body assist Assist for lower body dressing: Supervision/Verbal cueing     Toileting Toileting    Toileting assist Assist for toileting: Supervision/Verbal cueing     Transfers Chair/bed transfer  Transfers assist     Chair/bed transfer assist level: Supervision/Verbal cueing     Locomotion Ambulation   Ambulation assist      Assist level: Contact Guard/Touching assist Assistive device: No Device Max distance: 400'  Walk 10 feet activity   Assist     Assist level: Supervision/Verbal cueing Assistive device: No Device   Walk 50 feet activity   Assist    Assist level: Supervision/Verbal cueing Assistive device: No Device    Walk 150 feet activity   Assist    Assist level: Contact Guard/Touching assist Assistive device: No Device    Walk 10 feet on uneven surface  activity   Assist Walk 10 feet on uneven surfaces activity did not occur:  Safety/medical concerns         Wheelchair     Assist Will patient use wheelchair at discharge?: No Type of Wheelchair: Manual    Wheelchair assist level: Supervision/Verbal cueing Max wheelchair distance: 50    Wheelchair 50 feet with 2 turns activity    Assist        Assist Level: Supervision/Verbal cueing   Wheelchair 150 feet activity     Assist  Wheelchair 150 feet activity did not occur: Safety/medical concerns        Medical Problem List and Plan: 1.  Debility secondary to necrotizing pneumonia, multiple ventilation episodes/trach and anasarca   Continue CIR  Team conference today to discuss current and goals and coordination of care, home and environmental barriers, and discharge planning with nursing, case manager, and therapies. Please see conference note from today as well.  2.  PAF/Antithrombotics: -DVT/anticoagulation:  Pharmaceutical: Other (comment)--Eliquis.              -antiplatelet therapy: N/A 3. Pain Management: Continue Suboxone bid             Increased gabapentin to 300 mg tid for neuropathy, increased to 600 3 times daily on 3/28             States lidocaine patches exacerbated pain  Continue capsaicin cream.   Trial of heat packs on 3/25 for bilateral feet, patient may try if desired, with benefit  Discussed TENS with therapies  Amitriptyline 10 nightly started on 3/28  Capsacin ordered on 3/30  ?Ketamine side effect  Controlled with meds on 3/30 4. Mood: LCSW to follow for evaluation and support.              -antipsychotic agents: N/A 5. Neuropsych: This patient is? capable of making decisions on his own behalf. 6. Skin/Wound Care: Routine pressure relief measures.  7. Fluids/Electrolytes/Nutrition: Monitor I/Os 8. MRSA Necrotizing PNA: Continue Zyvox through 4/6 9. 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 MTX did NOT work due to side effects 10. Delirium: Encephalopathy resolving  Seroquel decreased to 50 twice  daily on 3/29, continue to wean 11. PAF/Tachycardia: Monitor HR tid--continue metoprolol and Eliquis bid.  Controlled on 3/30  Monitor with increased activity 12. Alcohol abuse: Has completed librium taper.   Continue Klonopin bid.             Continue Folic acid/thiamine 13. HTN: Monitor BP tid--on lasix and low dose metoprolol.   Relatively controlled on 3/30  Monitor with increased mobility 14. Anemia of chronic disease?: At baseline of 10. Monitor for signs of bleeding.   Hemoglobin 9.4 on 3/28  Continue to monitor 15.  Transaminitis:  LFTs elevated, but improving on 3/28, continue to monitor 16.  Hyponatremia  Sodium 135 on 3/28 17.  Dysphagia  Regular thins, intermittent supervision  LOS: 6 days A FACE TO FACE EVALUATION WAS PERFORMED  Lorien Shingler Karis Juba 10/05/2020, 10:11 AM

## 2020-10-05 NOTE — Progress Notes (Signed)
Occupational Therapy Discharge Summary  Patient Details  Name: Joseph Ayala MRN: [id number] Date of Birth: [birth year]     Patient has met 9 of 9 long term goals due to improved activity tolerance, improved balance, postural control and improved coordination.  Patient to discharge at overall Supervision level.  Patient's care partner is independent to provide the necessary physical and cognitive assistance at discharge.    Reasons goals not met: na  Recommendation:  Ongoing PT to address balance and endurance deficits.  No further OT needs at this time.    Equipment: wife purchased shower seat  Reasons for discharge: treatment goals met  Patient/family agrees with progress made and goals achieved: Yes  OT Discharge Precautions/Restrictions  Precautions Precautions: Fall Restrictions Weight Bearing Restrictions: No ADL ADL Eating: Independent Grooming: Modified independent,Supervision/safety Where Assessed-Grooming: Standing at sink,Sitting at sink Upper Body Bathing: Modified independent Where Assessed-Upper Body Bathing: Shower Lower Body Bathing: Supervision/safety Where Assessed-Lower Body Bathing: Shower Upper Body Dressing: Setup Where Assessed-Upper Body Dressing: Edge of bed Lower Body Dressing: Supervision/safety Where Assessed-Lower Body Dressing: Edge of bed Toileting: Supervision/safety Where Assessed-Toileting: Glass blower/designer: Close supervision Armed forces technical officer Method: Counselling psychologist: Raised Counselling psychologist: Close supervision Social research officer, government Method: Ambulating Vision Baseline Vision/History: No visual deficits Patient Visual Report: No change from baseline Vision Assessment?: No apparent visual deficits Perception  Perception: Within Functional Limits Praxis Praxis: Intact Cognition Overall Cognitive Status: Within Functional Limits for tasks assessed Arousal/Alertness:  Awake/alert Orientation Level: Oriented X4 Safety/Judgment: Appears intact Sensation Coordination Fine Motor Movements are Fluid and Coordinated: Yes Finger Nose Finger Test: mild dysmetria left, reaction time 1.11 sec Motor  Motor Motor - Discharge Observations: cues for posture, occ kyphosis Mobility  Bed Mobility Rolling Right: Independent Supine to Sit: Independent Sit to Supine: Independent Transfers Sit to Stand: Supervision/Verbal cueing Stand to Sit: Supervision/Verbal cueing  Balance Dynamic Sitting Balance Dynamic Sitting - Level of Assistance: 7: Independent Dynamic Standing Balance Dynamic Standing - Level of Assistance: 5: Stand by assistance Extremity/Trunk Assessment RUE Assessment RUE Assessment: Within Functional Limits LUE Assessment LUE Assessment: Within Functional Limits   Carlos [name] 10/05/2020, 4:08 PM

## 2020-10-05 NOTE — Progress Notes (Signed)
Physical Therapy Session Note  Patient Details  Name: Joseph Ayala Thone MRN: [id number] Date of Birth: 1966-05-04  Today's Date: 10/05/2020 PT Individual Time:Session1: 0830-0900; [place]: [phone]; Session3: 1415-1500 PT Individual Time Calculation (min): 30 min; 43 min & 45 min  Short Term Goals: Week 1:  PT Short Term Goal 1 (Week 1): STG=LTG due to ELOS  Skilled Therapeutic Interventions/Progress Updates:    Session1: Patient in supine, but ready for therapy, reports wife to come later for caregiver training.  Performed sit to stand with S and ambulation to dayroom x 300' with RW due to c/o foot pain more today and numbness, "feels like walking on nubs".  Patient seated to remove socks and shoes and performed towel sweeps and toe curls with cues for foot intrinsics and coordination.  MD in to see pt during session.  Patient replaced shoes and socks and ambulated to room with RW and S.  Returned to supine and left with call bell and needs in reach.  Session2: Patient seen with wife present for caregiver education.  Patient finishing with OT and standing in the room.  Requesting to toilet so assisted with S bathroom and pt toileted in standing with wife for distant supervision.  Patient ambulated with S to ortho gym and performed car transfer with S x 2 to simulated sedan height.  Negotiated ramp, mulch and curb step with S without UE support, but discussed using door facing as entering home if needed for support and educated wife on S level on down side of step to enter home when pt traversing.  Patient ambulated with S with wife providing assist to general gym and performed floor transfer with S discussing with wife when it is safe for home to get up and when to call 911.  Patient practiced accessing bed at 30" height as wife reports their bed at 34" (mat at highest point), and still able to perform without stool with S.  Discussed using step they have that is exercise step to long and sturdy if  needed.  Patient returned to room with S x 150' and seated EOB with wife present.  Discussed follow up HHPT as per wife request and getting handicapped hang tag for the car.  Also if outing to store using electric cart for energy conservation.  Patient left on EOB with wife in the room and call bell in reach.   Session3: Patient in supine wrything due to foot pain reports 10/10, but when NT made aware reports they have checked medications but not yet time, discussed with PA and provided cream to his feet.  Patient agreeable to try to work on HEP.  Patient donned socks and shoes independent and ambulated to therapy gym.  Patient reviewed foot and ankle exercises performed in first session and added to HEP.  Performed sit to supine independent and performed bridging w/ ball between knees w/ 5 sec hold, x 10, clamshell sidelying hip abduction x 10 (with band for HEP), rolled and up to quadruped with S to perform bird dog exercise with physioball under abdomen x 5 each side, then performed standing at hi-lo table for marching forward and braiding.  Ambulated to room and allowed to rest due to foot pain.  Printed HEP and returned to review with patient verbally.  Left in resource notebook to return home with pt.  Patient requesting to toilet so S for supine to sit to stand and to ambulate to bathroom.  Reports will pull cord for assist so NT  made aware.   Therapy Documentation Precautions:  Precautions Precautions: Fall Restrictions Weight Bearing Restrictions: No   Therapy/Group: Individual Therapy  Elray Mcgregor  Sheran Lawless, PT 10/05/2020, 8:27 AM

## 2020-10-05 NOTE — Progress Notes (Deleted)
[place] PHYSICAL MEDICINE & REHABILITATION PROGRESS NOTE  Subjective/Complaints: Patient seen sitting up working with therapy this morning.  He states he slept well overnight.  He notes bilateral feet pain.  He is questions regarding improvement.  He now states that he does not have any benefit with heat on his feet.  He also notes no benefit with Lidoderm patch.  ROS: Denies CP, SOB, N/V/D  Objective: Vital Signs: Blood pressure 96/62, pulse 73, temperature 98 F (36.7 C), resp. rate 18, SpO2 100 %. No results found. Recent Labs    10/03/20 0702  WBC 8.3  HGB 9.4*  HCT 27.7*  PLT 224   Recent Labs    10/03/20 0702  NA 135  K 3.9  CL 97*  CO2 31  GLUCOSE 118*  BUN 9  CREATININE 0.53*  CALCIUM 9.6    Intake/Output Summary (Last 24 hours) at 10/05/2020 1011 Last data filed at 10/05/2020 0740 Gross per 24 hour  Intake 1560 ml  Output 800 ml  Net 760 ml     Pressure Injury 09/07/20 Buttocks Right;Left Stage 2 -  Partial thickness loss of dermis presenting as a shallow open injury with a red, pink wound bed without slough. one stage 2 on each buttock (Active)  09/07/20 1744  Location: Buttocks  Location Orientation: Right;Left  Staging: Stage 2 -  Partial thickness loss of dermis presenting as a shallow open injury with a red, pink wound bed without slough.  Wound Description (Comments): one stage 2 on each buttock  Present on Admission: No    Physical Exam: BP 96/62 (BP Location: Left Arm)   Pulse 73   Temp 98 F (36.7 C)   Resp 18   SpO2 100%  Constitutional: No distress . Vital signs reviewed. HENT: Normocephalic.  Atraumatic. Eyes: EOMI. No discharge. Cardiovascular: No JVD.  RRR. Respiratory: Normal effort.  No stridor.  Bilateral clear to auscultation. GI: Non-distended.  BS +. Skin: Warm and dry.  Intact. Psych: Normal mood.  Normal behavior. Musc: No edema in extremities.  No tenderness in extremities. Neuro: Alert Motor: Motor: 4 --4/5  throughout, unchanged Sensation diminished to light touch in bilateral lower extremities  Assessment/Plan: 1. Functional deficits which require 3+ hours per day of interdisciplinary therapy in a comprehensive inpatient rehab setting.  Physiatrist is providing close team supervision and 24 hour management of active medical problems listed below.  Physiatrist and rehab team continue to assess barriers to discharge/monitor patient progress toward functional and medical goals   Care Tool:  Bathing    Body parts bathed by patient: Right arm,Left arm,Chest,Abdomen,Front perineal area,Buttocks,Right upper leg,Left upper leg,Right lower leg,Left lower leg,Face         Bathing assist Assist Level: Supervision/Verbal cueing     Upper Body Dressing/Undressing Upper body dressing   What is the patient wearing?: Pull over shirt    Upper body assist Assist Level: Set up assist    Lower Body Dressing/Undressing Lower body dressing      What is the patient wearing?: Underwear/pull up,Pants     Lower body assist Assist for lower body dressing: Supervision/Verbal cueing     Toileting Toileting    Toileting assist Assist for toileting: Supervision/Verbal cueing     Transfers Chair/bed transfer  Transfers assist     Chair/bed transfer assist level: Supervision/Verbal cueing     Locomotion Ambulation   Ambulation assist      Assist level: Contact Guard/Touching assist Assistive device: No Device Max distance: 400'  Walk 10 feet activity   Assist     Assist level: Supervision/Verbal cueing Assistive device: No Device   Walk 50 feet activity   Assist    Assist level: Supervision/Verbal cueing Assistive device: No Device    Walk 150 feet activity   Assist    Assist level: Contact Guard/Touching assist Assistive device: No Device    Walk 10 feet on uneven surface  activity   Assist Walk 10 feet on uneven surfaces activity did not occur:  Safety/medical concerns         Wheelchair     Assist Will patient use wheelchair at discharge?: No Type of Wheelchair: Manual    Wheelchair assist level: Supervision/Verbal cueing Max wheelchair distance: 50    Wheelchair 50 feet with 2 turns activity    Assist        Assist Level: Supervision/Verbal cueing   Wheelchair 150 feet activity     Assist  Wheelchair 150 feet activity did not occur: Safety/medical concerns        Medical Problem List and Plan: 1.  Debility secondary to necrotizing pneumonia, multiple ventilation episodes/trach and anasarca   Continue CIR  Team conference today to discuss current and goals and coordination of care, home and environmental barriers, and discharge planning with nursing, case manager, and therapies. Please see conference note from today as well.  2.  PAF/Antithrombotics: -DVT/anticoagulation:  Pharmaceutical: Other (comment)--Eliquis.              -antiplatelet therapy: N/A 3. Pain Management: Continue Suboxone bid             Increased gabapentin to 300 mg tid for neuropathy, increased to 600 3 times daily on 3/28             States lidocaine patches exacerbated pain  Continue capsaicin cream.   Trial of heat packs on 3/25 for bilateral feet, patient may try if desired, with benefit  Discussed TENS with therapies  Amitriptyline 10 nightly started on 3/28  Capsacin ordered on 3/30 1  ?Ketamine side effect  Controlled with meds on 3/30 4. Mood: LCSW to follow for evaluation and support.              -antipsychotic agents: N/A 5. Neuropsych: This patient is? capable of making decisions on his own behalf. 6. Skin/Wound Care: Routine pressure relief measures.  7. Fluids/Electrolytes/Nutrition: Monitor I/Os 8. MRSA Necrotizing PNA: Continue Zyvox through 4/6 9. 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 MTX did NOT work due to side effects 10. Delirium: Encephalopathy resolving  Seroquel decreased to 50 twice  daily on 3/29, continue to wean 11. PAF/Tachycardia: Monitor HR tid--continue metoprolol and Eliquis bid.  Controlled on 3/30  Monitor with increased activity 12. Alcohol abuse: Has completed librium taper.   Continue Klonopin bid.             Continue Folic acid/thiamine 13. HTN: Monitor BP tid--on lasix and low dose metoprolol.   Relatively controlled on 3/30  Monitor with increased mobility 14. Anemia of chronic disease?: At baseline of 10. Monitor for signs of bleeding.   Hemoglobin 9.4 on 3/28  Continue to monitor 15.  Transaminitis:  LFTs elevated, but improving on 3/28, continue to monitor 16.  Hyponatremia  Sodium 135 on 3/28 17.  Dysphagia  Regular thins, intermittent supervision  LOS: 6 days A FACE TO FACE EVALUATION WAS PERFORMED  Joseph Ayala 10/05/2020, 10:11 AM

## 2020-10-05 NOTE — Progress Notes (Signed)
Physical Therapy Discharge Summary  Patient Details  Name: Joseph Ayala MRN: [id number] Date of Birth: [birth year]  Today's Date: 10/05/2020 PT Individual Time: 1415-1500 PT Individual Time Calculation (min): 45 min    Patient has met 9 of 9 long term goals due to improved activity tolerance, improved balance, increased strength and ability to compensate for deficits.  Patient to discharge at an ambulatory level Supervision.   Patient's care partner is independent to provide the necessary physical assistance at discharge.  Reasons goals not met: Goals met  Recommendation:  Patient will benefit from ongoing skilled PT services in home health setting to continue to advance safe functional mobility, address ongoing impairments in balance, endurance, safety in the home & strength, and minimize fall risk.  Equipment: No equipment provided  Reasons for discharge: treatment goals met  Patient/family agrees with progress made and goals achieved: Yes  PT Discharge Precautions/Restrictions Precautions Precautions: Fall Restrictions Weight Bearing Restrictions: No Pain Pain Assessment Faces Pain Scale: Hurts worst Pain Type: Neuropathic pain Pain Location: Foot Pain Orientation: Right;Left Pain Descriptors / Indicators: Aching;Burning Pain Onset: On-going Pain Intervention(s): Repositioned;Distraction;RN made aware PAINAD (Pain Assessment in Advanced Dementia) Breathing: normal Negative Vocalization: none Body Language: relaxed Vision/Perception  Perception Perception: Within Functional Limits Praxis Praxis: Intact  Cognition Overall Cognitive Status: Within Functional Limits for tasks assessed Arousal/Alertness: Awake/alert Orientation Level: Oriented X4 Safety/Judgment: Appears intact Sensation Sensation Light Touch: Impaired by gross assessment Additional Comments: numbness, tingling, neuropathy in feet Coordination Gross Motor Movements are Fluid and Coordinated:  Yes Fine Motor Movements are Fluid and Coordinated: Yes Finger Nose Finger Test: mild dysmetria left, reaction time 1.11 sec Motor  Motor Motor: Abnormal postural alignment and control Motor - Discharge Observations: cues for posture, occ kyphosis, posterior pelvic tilt  Mobility Bed Mobility Bed Mobility: Rolling Right;Sit to Supine;Supine to Sit Rolling Right: Independent Supine to Sit: Independent Sit to Supine: Independent Transfers Transfers: Sit to Stand;Stand to Sit;Stand Pivot Transfers Sit to Stand: Supervision/Verbal cueing Stand to Sit: Supervision/Verbal cueing Stand Pivot Transfers: Supervision/Verbal cueing Transfer (Assistive device): None Locomotion  Gait Ambulation: Yes Gait Assistance: Supervision/Verbal cueing Gait Distance (Feet): 300 Feet Assistive device: None Gait Gait: Yes Gait Pattern: Impaired Gait Pattern: Step-through pattern;Antalgic;Wide base of support;Decreased stride length;Decreased hip/knee flexion - left;Decreased hip/knee flexion - right Stairs / Additional Locomotion Stairs: Yes Stairs Assistance: Supervision/Verbal cueing Stair Management Technique: Two rails;Forwards;Alternating pattern;Step to pattern Number of Stairs: 4 Height of Stairs: 6 Ramp: Supervision/Verbal cueing Curb: Supervision/Verbal cueing Wheelchair Mobility Wheelchair Mobility: No  Trunk/Postural Assessment  Cervical Assessment Cervical Assessment: Within Functional Limits Thoracic Assessment Thoracic Assessment: Exceptions to [facility] (increased kyphosis) Lumbar Assessment Lumbar Assessment: Exceptions to [facility] (posterior pelvic tilt) Postural Control Postural Control: Within Functional Limits  Balance Balance Balance Assessed: Yes Dynamic Sitting Balance Dynamic Sitting - Balance Support: No upper extremity supported;Feet supported Dynamic Sitting - Level of Assistance: 7: Independent Dynamic Standing Balance Dynamic Standing - Balance Support: No upper  extremity supported Dynamic Standing - Level of Assistance: 5: Stand by assistance Extremity Assessment  RUE Assessment RUE Assessment: Within Functional Limits LUE Assessment LUE Assessment: Within Functional Limits RLE Assessment RLE Assessment: Within Functional Limits Active Range of Motion (AROM) Comments: WFL LLE Assessment LLE Assessment: Within Functional Limits Active Range of Motion (AROM) Comments: WFL    Jamison Oka, PT 10/05/2020, 4:15 PM

## 2020-10-06 ENCOUNTER — Other Ambulatory Visit (HOSPITAL_COMMUNITY): Payer: Self-pay | Admitting: Physical Medicine and Rehabilitation

## 2020-10-06 DIAGNOSIS — G7289 Other specified myopathies: Secondary | ICD-10-CM

## 2020-10-06 MED ORDER — TRAMADOL HCL 50 MG PO TABS: 50.0000 mg | ORAL_TABLET | Freq: Four times a day (QID) | ORAL | 0 refills | Status: DC | PRN

## 2020-10-06 MED ORDER — QUETIAPINE FUMARATE 50 MG PO TABS: 50.0000 mg | ORAL_TABLET | Freq: Two times a day (BID) | ORAL | 0 refills | Status: DC

## 2020-10-06 MED ORDER — GABAPENTIN 300 MG PO CAPS: 900.0000 mg | ORAL_CAPSULE | Freq: Three times a day (TID) | ORAL | 0 refills | Status: DC

## 2020-10-06 MED FILL — GABAPENTIN 300 MG CAPSULE: 300 | 30 days supply | Qty: 270 | Fill #0

## 2020-10-06 MED FILL — traMADol HCL 50 MG TABS: 50 | 5 days supply | Qty: 20 | Fill #0

## 2020-10-06 MED FILL — QUETIAPINE FUMARATE 50 MG T: 50 | 30 days supply | Qty: 60 | Fill #0

## 2020-10-06 NOTE — Progress Notes (Signed)
Physical Therapy Session Note  Patient Details  Name: Joseph Ayala MRN: [id number] Date of Birth: 19-Jun-1966  Today's Date: 10/06/2020 PT Individual Time:  1304-1340 PT Individual Time Calculation (min): 36 min  Short Term Goals: Week 1:  PT Short Term Goal 1 (Week 1): STG=LTG due to ELOS  Skilled Therapeutic Interventions/Progress Updates:  Patient standing/ pacing in room upon PT arrival d/t ongoing pain in bil feet. Patient alert and agreeable to PT session.  Therapeutic Activity: Bed Mobility: Patient performed supine to/from sit with independence.  Transfers: Patient performed STS and SPVT transfers throughout session to/ from various seats heights and surfaces with independence/ supervision. Is able to perform with hands on thighs and demos good control with rise and descent. Good safety awareness of positioning. No vc required.   Gait Training:  Patient ambulated >300 feet x2 with no AD and supervision. Guided in different aspects of DGI including head turns, changing speeds and changing directions. No LOB or increase insway noted. During amb in hallway, pt meets ICU surgeon who removes bandage from tracheostomy. Ostomy is healing well and appears closed, pt instructed by surgeon that he does not need to cover area anymore.  Neuromuscular Re-ed: NMR facilitated during session with focus on dynamic standing balance. Pt guided in aspects of DGI as well as stepping over low obstacles and 6" hurdles. NMR performed for improvements in motor control and coordination, balance, sequencing, judgement, and self confidence/ efficacy in performing all aspects of mobility at highest level of independence.   Therapeutic Exercise: Patient performed the following exercises with verbal and tactile cues for proper technique. Orange tband used for: [employer] 2x10 with 3 sec holds Charity fundraiserY"s with slow eccentric return x10 bilaterally Shoulder abduction with 90 deg elbow bend   Patient  supine in bed at end of session with brakes locked, RN notified as to pt's pain level in feet as well as agitation and all needs within reach.   Therapy Documentation Precautions:  Precautions Precautions: Fall Restrictions Weight Bearing Restrictions: No   Therapy/Group: Individual Therapy  Loel Dubonnet 10/05/2020, 4:56 PM

## 2020-10-06 NOTE — Progress Notes (Signed)
Patient discharged at 12:45 pm with family on a wheelchair. Wife and patient received discharge instructions per provider. All questions answered. Patient discharged with all personal belongings. No complaints of pain or discomfort at time of discharge.

## 2020-10-06 NOTE — Progress Notes (Addendum)
[facility] PHYSICAL MEDICINE & REHABILITATION PROGRESS NOTE  Subjective/Complaints: Patient seen laying in bed this morning.  He states he did not sleep well overnight due to pain in his feet.  Wife on the phone as well, she reminds patient that pain is on and off, however patient states that she is not aware of what is going on and that the pain is all the time.  He is eager for discharge.  Wife also has questions regarding fluid intake and CHF.  ROS: +Bilateral feet pain.  Denies CP, SOB, N/V/D  Objective: Vital Signs: Blood pressure (!) 143/94, pulse 87, temperature 97.8 F (36.6 C), resp. rate 18, SpO2 96 %. No results found. No results for input(s): WBC, HGB, HCT, PLT in the last 72 hours. No results for input(s): NA, K, CL, CO2, GLUCOSE, BUN, CREATININE, CALCIUM in the last 72 hours.  Intake/Output Summary (Last 24 hours) at 10/06/2020 1004 Last data filed at 10/06/2020 1779 Gross per 24 hour  Intake 1780 ml  Output 1475 ml  Net 305 ml     Pressure Injury 09/07/20 Buttocks Right;Left Stage 2 -  Partial thickness loss of dermis presenting as a shallow open injury with a red, pink wound bed without slough. one stage 2 on each buttock (Active)  09/07/20 1744  Location: Buttocks  Location Orientation: Right;Left  Staging: Stage 2 -  Partial thickness loss of dermis presenting as a shallow open injury with a red, pink wound bed without slough.  Wound Description (Comments): one stage 2 on each buttock  Present on Admission: No    Physical Exam: BP (!) 143/94 (BP Location: Left Arm)   Pulse 87   Temp 97.8 F (36.6 C)   Resp 18   SpO2 96%  Constitutional: No distress . Vital signs reviewed. HENT: Normocephalic.  Atraumatic. Eyes: EOMI. No discharge. Cardiovascular: No JVD.  RRR. Respiratory: Normal effort.  No stridor.  Bilateral clear to auscultation. GI: Non-distended.  BS +. Skin: Warm and dry.  Intact. Psych: Normal mood.  Normal behavior. Musc: No edema in  extremities.  No tenderness in extremities. Neuro: Alert Motor: Motor: 4 --4/5 throughout, stable Sensation diminished to light touch in bilateral lower extremities  Assessment/Plan: 1. Functional deficits which require 3+ hours per day of interdisciplinary therapy in a comprehensive inpatient rehab setting.  Physiatrist is providing close team supervision and 24 hour management of active medical problems listed below.  Physiatrist and rehab team continue to assess barriers to discharge/monitor patient progress toward functional and medical goals   Care Tool:  Bathing    Body parts bathed by patient: Right arm,Left arm,Chest,Abdomen,Front perineal area,Buttocks,Right upper leg,Left upper leg,Right lower leg,Left lower leg,Face         Bathing assist Assist Level: Supervision/Verbal cueing     Upper Body Dressing/Undressing Upper body dressing   What is the patient wearing?: Pull over shirt    Upper body assist Assist Level: Set up assist    Lower Body Dressing/Undressing Lower body dressing      What is the patient wearing?: Underwear/pull up,Pants     Lower body assist Assist for lower body dressing: Set up assist     Toileting Toileting    Toileting assist Assist for toileting: Supervision/Verbal cueing     Transfers Chair/bed transfer  Transfers assist     Chair/bed transfer assist level: Supervision/Verbal cueing     Locomotion Ambulation   Ambulation assist      Assist level: Supervision/Verbal cueing Assistive device: No Device Max  distance: 300   Walk 10 feet activity   Assist     Assist level: Supervision/Verbal cueing Assistive device: No Device   Walk 50 feet activity   Assist    Assist level: Supervision/Verbal cueing Assistive device: No Device    Walk 150 feet activity   Assist    Assist level: Supervision/Verbal cueing Assistive device: No Device    Walk 10 feet on uneven surface  activity   Assist Walk  10 feet on uneven surfaces activity did not occur: Safety/medical concerns   Assist level: Supervision/Verbal cueing Assistive device: Other (comment) (no device)   Wheelchair     Assist Will patient use wheelchair at discharge?: No Type of Wheelchair: Manual    Wheelchair assist level: Supervision/Verbal cueing Max wheelchair distance: 50    Wheelchair 50 feet with 2 turns activity    Assist        Assist Level: Supervision/Verbal cueing   Wheelchair 150 feet activity     Assist  Wheelchair 150 feet activity did not occur: Safety/medical concerns        Medical Problem List and Plan: 1.  Debility secondary to necrotizing pneumonia, multiple ventilation episodes/trach and anasarca  DC today  Will see patient for hospital follow-up in 1 month post-discharge 2.  PAF/Antithrombotics: -DVT/anticoagulation:  Pharmaceutical: Other (comment)--Eliquis.              -antiplatelet therapy: N/A 3. Pain Management: Continue Suboxone bid             Increased gabapentin to 300 mg tid for neuropathy, increased to 600 3 times daily on 3/28             States lidocaine patches exacerbated pain  Continue capsaicin cream.   Trial of heat packs on 3/25 for bilateral feet, patient may try if desired, with benefit  Discussed TENS with therapies  Capsacin ordered on 3/30  ?Ketamine side effect  ?  Relatively controlled-does not appear to be functionally limiting with conflicting reports from wife  Behavioral component as well 4. Mood: LCSW to follow for evaluation and support.              -antipsychotic agents: N/A 5. Neuropsych: This patient is? capable of making decisions on his own behalf. 6. Skin/Wound Care: Routine pressure relief measures.  7. Fluids/Electrolytes/Nutrition: Monitor I/Os 8. MRSA Necrotizing PNA: Continue Zyvox through 4/6 9. 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 MTX did NOT work due to side effects 10. Delirium: Encephalopathy  resolving  Seroquel decreased to 50 twice daily on 3/29, continue to wean as outpatient 11. PAF/Tachycardia: Monitor HR tid--continue metoprolol and Eliquis bid.  Controlled on 3/31  Monitor with increased activity 12. Alcohol abuse: Has completed librium taper.   Klonopin bid being weaned.             Continue Folic acid/thiamine 13. HTN: Monitor BP tid--on lasix and low dose metoprolol.   Elevated this a.m. otherwise has been controlled, follow-up in ambulatory setting  Monitor with increased mobility 14. Anemia of chronic disease?: At baseline of 10. Monitor for signs of bleeding.   Hemoglobin 9.4 on 3/28  Continue to monitor 15.  Transaminitis:  LFTs elevated, but improving on 3/28, continue to monitor 16.  Hyponatremia  Sodium 135 on 3/28 17.  Dysphagia  Regular thins, intermittent supervision  > 30 minutes spent in total in discharge planning between myself and PA regarding aforementioned, as well discussion regarding DME equipment, follow-up appointments, follow-up therapies, discharge medications-particularly pain medications, discharge  recommendations, answering questions.  Please see discharge summary as well.  LOS: 7 days A FACE TO FACE EVALUATION WAS PERFORMED  Maanya Hippert Karis Juba 10/06/2020, 10:04 AM

## 2020-10-06 NOTE — Progress Notes (Signed)
Inpatient Rehabilitation Care Coordinator Discharge Note  The overall goal for the admission was met for:   Discharge location: Yes-HOME WITH WIFE WHO IS CURRENTLY ON FMLA CAN PROVIDE 24/7 SUPERVISION  Length of Stay: Yes-7 DAYS  Discharge activity level: Yes-SUPERVISION LEVEL  Home/community participation: Yes  Services provided included: MD, RD, PT, OT, SLP, RN, CM, TR, Pharmacy, Neuropsych and SW  Financial Services: Private Insurance: [place] offered to/list presented to:YES  Follow-up services arranged: Home Health: [facility] and Patient/Family has no preference for HH/DME agencies NO EQUIPMENT NEEDS HAS ACCESS TO [facility] AND WIFE ORDERED A TUB SEAT FOR HOME  Comments (or additional information):WIFE WAS HERE [address].  Patient/Family verbalized understanding of follow-up arrangements: Yes  Individual responsible for coordination of the follow-up plan: GINGER-WIFE [phone]  Confirmed correct DME delivered: Elease Hashimoto 10/06/2020    Drisana Schweickert, Gardiner Rhyme

## 2020-10-06 NOTE — Discharge Summary (Addendum)
Physician Discharge Summary  Patient ID: Joseph Ayala MRN: [id number] DOB/AGE: [birth year] 53 y.o.  Admit date: 09/29/2020 Discharge date: 10/06/2020  Discharge Diagnoses:  Principal Problem:   Debility Active Problems:   Chronic pain syndrome   Necrotizing pneumonia (HCC)   Nerve pain   Transaminitis   Anemia of chronic disease   Essential hypertension   PAF (paroxysmal atrial fibrillation) (HCC)   Neuropathic pain   Discharged Condition: stable   Significant Diagnostic Studies: N/A   Labs:  Basic Metabolic Panel: BMP Latest Ref Rng & Units 10/03/2020 09/30/2020 09/28/2020  Glucose 70 - 99 mg/dL 762(U) 92 94  BUN 6 - 20 mg/dL 9 14 16   Creatinine 0.61 - 1.24 mg/dL ) 6.33(H 5.45  BUN/Creat Ratio 9 - 20 - - -  Sodium 135 - 145 mmol/L 135 131(L) 137  Potassium 3.5 - 5.1 mmol/L 3.9 4.0 4.3  Chloride 98 - 111 mmol/L 97(L) 93(L) 102  CO2 22 - 32 mmol/L 31 29 27   Calcium 8.9 - 10.3 mg/dL 9.6 6.25    CBC: CBC Latest Ref Rng & Units 10/03/2020 09/30/2020 09/28/2020  WBC 4.0 - 10.5 K/uL 8.3 8.9 9.8  Hemoglobin 13.0 - 17.0 g/dL 10/02/2020) 10.9(L) 10.5(L)  Hematocrit 39.0 - 52.0 % 27.7(L) 33.3(L) 32.4(L)  Platelets 150 - 400 K/uL 224 267 292    CBG: No results for input(s): GLUCAP in the last 168 hours.  Brief HPI:   Joseph Ayala is a 55 y.o. male with history of DDD, alcohol/polysubstance abuse, necrotizing myopathy who was originally admitted to [facility] on 08/28/2020 with necrotizing MRSA PNA.  Was negative for PE but showed incidental 4.6 cm thoracic aneurysm.  He did go on to develop progressive airspace disease with cavitation of RLL/RML, fluid overload as well as bouts of agitation requiring addition of ketamine to Dilaudid and propofol to manage withdrawal.  He required tracheostomy but was able to wean to ATC and was decannulated by 09/27/2020.  Cardiology was consulted for input on A. fib with elevated tropes and global hypokinesis which was felt to be due to demand  ischemia and cardiomyopathy question to be due to stress versus alcohol related.  He converted to NSR on amiodarone and was transitioned to oral AC.  He did develop fevers with progressive PNA and airspace disease felt to be due to high burden of disease and antibiotics were changed to Ceftraloine and he was transitioned to Zyvox with recommendation for 2 additional weeks of IV antibiotics.  He was weaned off ketamine and Suboxone was resumed.  He was noted to be debilitated with balance deficits, tachycardia as well as hypoxia with activity.  CIR was recommended due to functional decline.   Hospital Course: Joseph Ayala was admitted to rehab 09/29/2020 for inpatient therapies to consist of PT, ST and OT at least three hours five days a week. Past admission physiatrist, therapy team and rehab RN have worked together to provide customized collaborative inpatient rehab.  His respiratory status has been stable and endurance has greatly improved. Blood pressures and heart rate were monitored on TID basis and has been reasonably controlled.  He continues on low-dose Lasix and metoprolol.  No cardiac symptoms reported with increase in activity.  Follow-up CBC shows leukocytosis has resolved and mild drop in H&H noted without signs of bleeding.  Recommend follow-up CBC in 1 to 2 weeks to monitor for stability.  Abnormal LFTs are slowly resolving.  Check of electrolytes showed hyponatremia has resolved and renal status  is stable.    Tachycardia has resolved with improvement in activity tolerance.  He was maintained on Zyvox and is to continue this through 04/06 to complete his antibiotic course.  Seroquel and Klonopin has slowly been weaned per wife's request and as mentation was improving.  Seroquel was decreased to 50 mg p.o. twice daily and plans for further taper after follow-up.  He was sent home with Klonopin to be tapered over 2 weeks.  He continued to report issues with significant neuropathy.  He refused  capsaicin cream and trials of heat packs were ordered.  He also refused lidocaine patches.  Gabapentin has slowly been titrated up to 900 mg TID which he is tolerating without side effects.  He has made good gains and currently requires supervision for safety.  [facility] to provide home health PT after discharge.   Rehab course: During patient's stay in rehab weekly team conferences were held to monitor patient's progress, set goals and discuss barriers to discharge. At admission, patient required min assist with mobility and ADL tasks. Speech therapy evaluation revealed restlessness and agitation but no signs or symptoms of aspiration.  He  has had improvement in activity tolerance, balance, postural control as well as ability to compensate for deficits.  He continues to have impulsivity with poor safety awareness.  He requires supervision with ADL tasks and with mobility. He is tolerating regular diet without any signs or symptoms of aspiration. Family education has been done with wife regarding all aspects of care as well as safety.   Discharge disposition: 01-Home or Self Care  Diet: Regular/Low salt.   Special Instructions: 1.Will need CBC/LFTs repeated in 1-2 weeks to monitor for improvement.  2. No alcohol,driving or strenuous activity till cleared by MD.    Discharge Instructions     Ambulatory referral to Cardiology   Complete by: As directed    Post hospital follow up--A fib   Ambulatory referral to Cardiothoracic Surgery   Complete by: As directed    Evaluate and follow up onThoracic aneurysm   Ambulatory referral to Physical Medicine Rehab   Complete by: As directed    3-4 week follow up appt      Allergies as of 10/06/2020       Reactions   Vicodin [hydrocodone-acetaminophen] Nausea And Vomiting        Medication List     STOP taking these medications    albuterol (2.5 MG/3ML) 0.083% nebulizer solution Commonly known as: PROVENTIL   bethanechol 10 MG  tablet Commonly known as: URECHOLINE   capsicum 0.075 % topical cream Commonly known as: ZOSTRIX   chlorhexidine 0.12 % solution Commonly known as: PERIDEX   clotrimazole-betamethasone cream Commonly known as: LOTRISONE   feeding supplement Liqd   ibuprofen 400 MG tablet Commonly known as: ADVIL   lidocaine 5 % Commonly known as: LIDODERM   sodium chloride 0.9 % infusion       TAKE these medications    acetaminophen 325 MG tablet Commonly known as: TYLENOL Take 1-2 tablets (325-650 mg total) by mouth every 4 (four) hours as needed for mild pain. What changed:  how much to take when to take this reasons to take this   apixaban 5 MG Tabs tablet Commonly known as: ELIQUIS Take 1 tablet (5 mg total) by mouth 2 (two) times daily.   buprenorphine-naloxone 8-2 mg Subl SL tablet Commonly known as: SUBOXONE Place 1 tablet under the tongue 2 (two) times daily.   clonazePAM 0.5 MG tablet--Rx #  Commonly known as: KLONOPIN Take one tab twice a week for 7 days. Then decrease to one tab per day till gone. What changed:  medication strength how much to take how to take this when to take this additional instructions   clotrimazole 1 % cream Commonly known as: LOTRIMIN Apply topically 2 (two) times daily. To bilateral feet Notes to patient: OTC   docusate sodium 100 MG capsule Commonly known as: COLACE Take 1 capsule (100 mg total) by mouth 2 (two) times daily. Notes to patient: OTC   folic acid 1 MG tablet Commonly known as: FOLVITE Take 1 tablet (1 mg total) by mouth daily. Notes to patient: OTC   furosemide 40 MG tablet Commonly known as: LASIX Take 1 tablet (40 mg total) by mouth daily.   gabapentin 300 MG capsule Commonly known as: NEURONTIN Take 3 capsules (900 mg total) by mouth 3 (three) times daily. What changed:  medication strength how much to take   hydrocerin Crea Apply 1 application topically 2 (two) times daily. Notes to patient: OTC    linezolid 600 MG tablet Commonly known as: ZYVOX Take 1 tablet (600 mg total) by mouth every 12 (twelve) hours.   lip balm ointment Apply topically as needed for lip care.   loperamide 2 MG capsule Commonly known as: IMODIUM Take 1 capsule (2 mg total) by mouth as needed for diarrhea or loose stools.   metoprolol tartrate 25 MG tablet Commonly known as: LOPRESSOR Take 0.5 tablets (12.5 mg total) by mouth 2 (two) times daily.   mouth rinse Liqd solution 15 mLs by Mouth Rinse route 2 times daily at 12 noon and 4 pm.   multivitamin with minerals Tabs tablet Take 1 tablet by mouth daily.   nystatin powder Commonly known as: MYCOSTATIN/NYSTOP Apply topically 2 (two) times daily.   QUEtiapine 50 MG tablet Commonly known as: SEROQUEL Take 1 tablet (50 mg total) by mouth 2 (two) times daily. What changed:  medication strength how much to take when to take this Another medication with the same name was removed. Continue taking this medication, and follow the directions you see here.   thiamine 100 MG tablet Take 1 tablet (100 mg total) by mouth daily. Notes to patient: OTC   traMADol 50 MG tablet--Rx # 20 pills Commonly known as: ULTRAM Take 1 tablet (50 mg total) by mouth every 6 (six) hours as needed (neuropathic pain in lower extremities). What changed: how much to take   traZODone 50 MG tablet Commonly known as: DESYREL Take 0.5-1 tablets (25-50 mg total) by mouth at bedtime as needed for sleep.        Follow-up Information     [name], MD. Call.   Specialty: Internal Medicine Why: for post hospital follow up and pain med Rx Contact information: [address] [place] [address] [phone]         [name], MD. Call.   Specialties: Cardiology, Radiology Why: for follow up appointment Contact information: [address] [place] [address] [phone]         Marcello Fennel, MD Follow  up.   Specialty: Physical Medicine and Rehabilitation Why: office will call you with follow up appointment Contact information: [address] [place] [address] [phone]         [facility]-Cardiac  Follow up.   Specialty: Cardiothoracic Surgery Why: for follow up on thoracic aneurysm  Contact information:  [address] [place], [address] [place] [address] [phone]                Signed: [name] 10/07/2020, 6:09 PM Patient was seen, face-face, and physical exam performed by me on day of discharge, greater than 30 minutes of total time spent.. Please see progress note from day of discharge as well.  Maryla Morrow, MD, ABPMR

## 2020-10-08 DIAGNOSIS — M5136 Other intervertebral disc degeneration, lumbar region: Secondary | ICD-10-CM | POA: Diagnosis not present

## 2020-10-08 DIAGNOSIS — J850 Gangrene and necrosis of lung: Secondary | ICD-10-CM | POA: Diagnosis not present

## 2020-10-08 DIAGNOSIS — M609 Myositis, unspecified: Secondary | ICD-10-CM | POA: Diagnosis not present

## 2020-10-08 DIAGNOSIS — R1310 Dysphagia, unspecified: Secondary | ICD-10-CM | POA: Diagnosis not present

## 2020-10-08 DIAGNOSIS — G9340 Encephalopathy, unspecified: Secondary | ICD-10-CM | POA: Diagnosis not present

## 2020-10-08 DIAGNOSIS — G8929 Other chronic pain: Secondary | ICD-10-CM | POA: Diagnosis not present

## 2020-10-08 DIAGNOSIS — G629 Polyneuropathy, unspecified: Secondary | ICD-10-CM | POA: Diagnosis not present

## 2020-10-08 DIAGNOSIS — B9562 Methicillin resistant Staphylococcus aureus infection as the cause of diseases classified elsewhere: Secondary | ICD-10-CM | POA: Diagnosis not present

## 2020-10-08 DIAGNOSIS — M47816 Spondylosis without myelopathy or radiculopathy, lumbar region: Secondary | ICD-10-CM | POA: Diagnosis not present

## 2020-10-09 ENCOUNTER — Encounter: Payer: Self-pay | Admitting: Student in an Organized Health Care Education/Training Program

## 2020-10-10 ENCOUNTER — Other Ambulatory Visit: Payer: Self-pay

## 2020-10-10 ENCOUNTER — Ambulatory Visit (INDEPENDENT_AMBULATORY_CARE_PROVIDER_SITE_OTHER): Payer: Medicare HMO | Admitting: Internal Medicine

## 2020-10-10 ENCOUNTER — Other Ambulatory Visit (HOSPITAL_COMMUNITY): Payer: Self-pay

## 2020-10-10 ENCOUNTER — Encounter: Payer: Self-pay | Admitting: Internal Medicine

## 2020-10-10 ENCOUNTER — Telehealth: Payer: Medicare HMO

## 2020-10-10 ENCOUNTER — Ambulatory Visit: Payer: Medicare HMO

## 2020-10-10 VITALS — BP 116/91 | HR 91 | Temp 98.4°F | Ht 71.0 in

## 2020-10-10 DIAGNOSIS — I10 Essential (primary) hypertension: Secondary | ICD-10-CM

## 2020-10-10 DIAGNOSIS — M79672 Pain in left foot: Secondary | ICD-10-CM | POA: Diagnosis not present

## 2020-10-10 DIAGNOSIS — M79671 Pain in right foot: Secondary | ICD-10-CM | POA: Diagnosis not present

## 2020-10-10 MED ORDER — KETOROLAC TROMETHAMINE 30 MG/ML IJ SOLN
30.0000 mg | Freq: Once | INTRAMUSCULAR | Status: AC
  Administered 2020-10-10: 30 mg via INTRAMUSCULAR

## 2020-10-10 NOTE — Patient Instructions (Addendum)
It was a pleasure meeting you today! We discussed your foot/ankle pain. I think this is related to your arterial flow. We gave you a shot of Toradol to help alleviate this pain. I will call you tomorrow to follow up regarding your pain and appointment with vascular.

## 2020-10-10 NOTE — Progress Notes (Signed)
   CC: foot/ankle pain  HPI:  [name] is a 55 y.o. with a PMHx listed below presenting for foot/ankle pain. For details of today's visit and the status of his chronic medical issues please refer to the assessment and plan.    Past Medical History:  Diagnosis Date  . Degenerative disc disease, lumbar   . Dental crown present   . ETOH abuse   . History of kidney stones   . Hypertension    states under control with meds., has been on med. x 1 yr. - is currently out of med., to see PCP 02/06/2018  . Marijuana use, continuous   . Muscle atrophy 01/2018  . Muscle weakness 01/2018  . Necrotizing myopathy   . Opioid use disorder   . Thoracic aortic aneurysm (TAA) (HCC)    a. seen on CT 08/2020.   Review of Systems:   Review of Systems  Constitutional: Negative for chills, fever and malaise/fatigue.  Respiratory: Negative for cough, sputum production and shortness of breath.   Cardiovascular: Positive for leg swelling. Negative for chest pain.  Gastrointestinal: Negative for abdominal pain, diarrhea, nausea and vomiting.  Musculoskeletal: Positive for joint pain and myalgias.  Skin: Positive for rash. Negative for itching.  Neurological: Positive for tingling, sensory change and weakness.     Physical Exam:  Vitals:   10/10/20 1550  BP: (!) 116/91  Pulse: 91  Temp: 98.4 F (36.9 C)  TempSrc: Oral  SpO2: 96%  Height: 5\' 11"  (1.803 m)   Physical Exam Constitutional:      General: He is in acute distress.     Appearance: Normal appearance.  HENT:     Head: Normocephalic and atraumatic.  Cardiovascular:     Rate and Rhythm: Normal rate and regular rhythm.     Pulses: Normal pulses.     Heart sounds: Normal heart sounds. No murmur heard. No friction rub. No gallop.   Pulmonary:     Effort: Pulmonary effort is normal. No respiratory distress.     Breath sounds: No wheezing or rales.  Abdominal:     General: Abdomen is flat. Bowel sounds are normal. There is no  distension.     Palpations: Abdomen is soft.     Tenderness: There is no abdominal tenderness. There is no guarding.  Musculoskeletal:        General: Swelling and tenderness present.  Skin:    General: Skin is warm and dry.     Findings: Erythema present.     Comments: Scattered areas of blotchy erythema on the right face, neck and lower extremities.  Extensive blistering and peeling of bilateral plantar surfaces of the feet  Neurological:     Mental Status: He is alert and oriented to person, place, and time.     Sensory: Sensory deficit present.     Motor: Weakness present.     Comments: Decreased sensation of bilateral lower extremities, left greater than right  Psychiatric:        Mood and Affect: Mood normal.        Behavior: Behavior normal.        Thought Content: Thought content normal.        Judgment: Judgment normal.     Assessment & Plan:   See Encounters Tab for problem based charting.  Patient seen with Dr. 

## 2020-10-10 NOTE — Assessment & Plan Note (Addendum)
Patient presents today with excruciating bilateral foot pain that has been going on since his most recent hospitalization and has since progressed.  He was recently hospitalized in February for necrotizing pneumonia with a complicated hospital course with ICU stay.  He required vasopressors, ventilatory support, chest tube placement and tracheostomy.  He was discharged from inpatient rehab last Thursday.  He has since made a significant recovery however his bilateral foot pain has significantly worsened.  His wife was with him today and states that while he was in the ICU on vasopressors he developed a significant rash on bilateral feet with distinct demarcations.  At that time it was difficult to palpate distal pulses and they could only be found with Doppler.  The rash significantly improved over the hospital course however since he has had significant blistering and peeling on bilateral plantar surfaces of his feet.  She states that bilateral knees demonstrate a similar course which have now developed new skin.  Areas of both lower extremities have lost patches of hair as well.  He states that he has difficulty completing his ADLs due to the significance of his pain.  He endorses numbness and tingling of bilateral lower extremities.  He is currently taking gabapentin 900 mg daily without any relief of his pain.  He is also tried extra strength Tylenol, vicks vapor rubs, and heat with minimal relief.  He states that the pain is sometimes improved with elevation of his feet.  On exam, he is tender to palpation of bilateral feet.  There is extensive blistering/peeling on the bottom of both plantar surfaces.  He has decreased sensation significantly on the left lower extremity.  Pulses intact.  Bilateral feet and lower extremities are warm to touch.  Given history and physical exam, this is concerning for arterial insufficiency.  Discussed hospital admission versus close follow-up with vascular with patient who  preferred outpatient follow-up.  Gave 1 dose of Toradol 30 mg IM for pain relief and will place an urgent referral to vascular to see the patient as soon as possible, ideally tomorrow.  Plan: -Toradol 30 mg -Urgent referral to vascular

## 2020-10-11 ENCOUNTER — Encounter (HOSPITAL_COMMUNITY): Payer: Medicare HMO

## 2020-10-11 ENCOUNTER — Other Ambulatory Visit: Payer: Self-pay

## 2020-10-11 ENCOUNTER — Telehealth: Payer: Self-pay | Admitting: *Deleted

## 2020-10-11 DIAGNOSIS — I70223 Atherosclerosis of native arteries of extremities with rest pain, bilateral legs: Secondary | ICD-10-CM

## 2020-10-11 NOTE — Chronic Care Management (AMB) (Signed)
  Care Management   Note  10/11/2020 Name: Rusty Villella Salmela MRN: [id number] DOB: 12-04-1965  Arline Asp T Carawan is a 55 y.o. year old male who is a primary care patient of [name], MD. I reached out to [facility] by phone today in response to a referral sent by Mr. CY BRESEE PCP, Dr, Oswaldo Done.     Mr. Mccloud was given information about care management services today including:  1. Care management services include personalized support from designated clinical staff supervised by his physician, including individualized plan of care and coordination with other care providers 2. 24/7 contact phone numbers for assistance for urgent and routine care needs. 3. The patient may stop care management services at any time by phone call to the office staff.  Patient agreed to services and verbal consent obtained.   Follow up plan: Telephone appointment with care management team member scheduled for:11/01/2020  [facility] Guide, Embedded Care Coordination [facility] Management

## 2020-10-12 ENCOUNTER — Other Ambulatory Visit (HOSPITAL_COMMUNITY): Payer: Self-pay

## 2020-10-12 ENCOUNTER — Other Ambulatory Visit: Payer: Self-pay

## 2020-10-12 ENCOUNTER — Ambulatory Visit: Payer: Medicare HMO | Admitting: Vascular Surgery

## 2020-10-12 ENCOUNTER — Ambulatory Visit (HOSPITAL_COMMUNITY)
Admission: RE | Admit: 2020-10-12 | Discharge: 2020-10-12 | Disposition: A | Discharge status: Home / Self Care | Payer: Medicare HMO | Source: Ambulatory Visit | Attending: Vascular Surgery | Admitting: Vascular Surgery

## 2020-10-12 ENCOUNTER — Ambulatory Visit (INDEPENDENT_AMBULATORY_CARE_PROVIDER_SITE_OTHER): Payer: Medicare HMO | Admitting: Internal Medicine

## 2020-10-12 ENCOUNTER — Encounter: Payer: Medicare HMO | Admitting: Vascular Surgery

## 2020-10-12 ENCOUNTER — Telehealth: Payer: Self-pay | Admitting: *Deleted

## 2020-10-12 ENCOUNTER — Encounter: Payer: Self-pay | Admitting: Vascular Surgery

## 2020-10-12 VITALS — BP 134/78 | HR 87 | Temp 97.9°F | Ht 71.0 in | Wt 197.6 lb

## 2020-10-12 VITALS — BP 118/72 | HR 82 | Temp 98.0°F | Resp 20 | Ht 71.0 in | Wt 195.0 lb

## 2020-10-12 DIAGNOSIS — M25579 Pain in unspecified ankle and joints of unspecified foot: Secondary | ICD-10-CM | POA: Diagnosis not present

## 2020-10-12 DIAGNOSIS — I70223 Atherosclerosis of native arteries of extremities with rest pain, bilateral legs: Secondary | ICD-10-CM | POA: Insufficient documentation

## 2020-10-12 DIAGNOSIS — M79671 Pain in right foot: Secondary | ICD-10-CM

## 2020-10-12 DIAGNOSIS — M79672 Pain in left foot: Secondary | ICD-10-CM | POA: Diagnosis not present

## 2020-10-12 MED ORDER — KETOROLAC TROMETHAMINE 30 MG/ML IJ SOLN
30.0000 mg | Freq: Once | INTRAMUSCULAR | Status: AC
  Administered 2020-10-12: 30 mg via INTRAMUSCULAR

## 2020-10-12 MED ORDER — PREGABALIN 25 MG PO CAPS: 25.0000 mg | ORAL_CAPSULE | Freq: Three times a day (TID) | ORAL | 2 refills | Status: DC

## 2020-10-12 MED ORDER — PREGABALIN 25 MG PO CAPS
25.0000 mg | ORAL_CAPSULE | Freq: Three times a day (TID) | ORAL | 2 refills | Status: DC
  Filled 2020-10-12: qty 90, 30d supply, fill #0

## 2020-10-12 NOTE — Chronic Care Management (AMB) (Signed)
  Care Management   Note  10/12/2020 Name: [name] MRN: [id number] DOB: 01-10-1966  Joseph [facility]ala is a 55 y.o. year old male who is a primary care patient of [name], MD and is actively engaged with the care management team. I reached out to [facility] by phone today to assist with re-scheduling an initial visit with the RN Case Manager  Follow up plan: Unsuccessful telephone outreach attempt made. A HIPAA compliant phone message was left for the patient providing contact information and requesting a return call. The care management team will reach out to the patient again over the next 7 days. If patient returns call to provider office, please advise to call Embedded Care Management Care Guide Gwenevere Ghazi at [phone].  Gwenevere Ghazi  Care Guide, Embedded Care Coordination [facility] Management

## 2020-10-12 NOTE — Progress Notes (Signed)
   CC: bilateral foot pain   HPI:  Mr.Nechemia T Goeser is a 55 y.o. male with PMHx as listed below presenting for ongoing bilateral foot pain. Please see problem based charting for complete assessment and plan.  Past Medical History:  Diagnosis Date  . Acute hypoxemic respiratory failure (HCC)   . Acute respiratory failure with hypoxia (HCC) 08/28/2020  . CAP (community acquired pneumonia) 08/28/2020  . Degenerative disc disease, lumbar   . Dental crown present   . ETOH abuse   . History of kidney stones   . Hypertension    states under control with meds., has been on med. x 1 yr. - is currently out of med., to see PCP 02/06/2018  . Marijuana use, continuous   . MRSA pneumonia (HCC) 09/09/2020  . Muscle atrophy 01/2018  . Muscle weakness 01/2018  . Necrotizing myopathy   . Necrotizing pneumonia (HCC) 09/09/2020  . Opioid use disorder   . Pleural effusion   . Sepsis (HCC) 08/28/2020  . Thoracic aortic aneurysm (TAA) (HCC)    a. seen on CT 08/2020.   Review of Systems:  Negative except as stated in HPI.  Physical Exam:  Vitals:   10/12/20 1508  BP: 134/78  Pulse: 87  Temp: 97.9 F (36.6 C)  TempSrc: Oral  SpO2: 97%  Weight: 197 lb 9.6 oz (89.6 kg)  Height: 5\' 11"  (1.803 m)   Physical Exam  Constitutional: Appears well-developed and well-nourished. In distress 2/2 pain  HENT: Normocephalic and atraumatic Cardiovascular: Normal rate, regular rhythm, S1 and S2 present, no murmurs, rubs, gallops.  Distal pulses intact - palpable pedal pulses  Musculoskeletal: Normal bulk and tone.  No peripheral edema noted. Neurological: Is alert and oriented x4, no apparent focal motor deficits; ongoing decreased sensation on plantar surface of bilateral feet (L>R) Skin: Warm and dry. Palmar aspect of bilateral feet with extensive peeling of skin, no lesions/ulcers/blisters noted    Assessment & Plan:   See Encounters Tab for problem based charting.  Patient discussed with Dr. 

## 2020-10-12 NOTE — Patient Instructions (Addendum)
Joseph [facility]ala,  It was a pleasure seeing you in clinic. Today we discussed:   Bilateral foot pain: This is likely secondary to ischemic neuropathy. At this time, I will switch your gabapentin to Lyrica 25mg  three times daily. You received a toradol shot today to help with the pain. Please contact if your pain is worse or not improving over the next several weeks.   If you have any questions or concerns, please call our clinic at [phone] between 9am-5pm and after hours call [phone] and ask for the internal medicine resident on call. If you feel you are having a medical emergency please call 911.   Thank you, we look forward to helping you remain healthy!

## 2020-10-12 NOTE — Progress Notes (Signed)
REASON FOR CONSULT:    To evaluate for peripheral vascular disease.  The consult is requested by Dr. Heide Spark.   ASSESSMENT & PLAN:   NEUROPATHY: This patient likely had prolonged ischemia to his feet related to being on pressure support when he was in shock.  He has multiphasic Doppler signals in both feet, palpable pulses, normal ABIs.  I think he likely had ischemic injury to the nerves which should slowly recover.  He is on Neurontin which helps some.  I do not think that he has any evidence of significant peripheral vascular disease.  I encouraged him to stay as active as possible.  I will be happy to see him back at any time if any new vascular issues arise.   Waverly Ferrari, MD Office: [phone]   HPI:   Joseph Ayala is a pleasant 55 y.o. male, who is referred for evaluation of peripheral vascular disease.  The patient was hospitalized from 09/29/2020 to 10/06/2020 with a necrotizing MRSA pneumonia.  He required pressor support during this time.  And his wife showed me pictures of his feet during this time when they were white from ischemia.  Ultimately this improved once he was off pressors.  Currently he is having problems with his skin peeling in his feet likely from the skin regenerating.  He has had persistent pain in his feet.  His symptoms are alleviated somewhat with gabapentin and but he still having significant pain.  Elevation does not make his symptoms worse.  I do not get any clear-cut history of claudication or rest pain.  His risk factors for peripheral vascular disease include hypertension, a family history of premature cardiovascular disease, and tobacco use.  He quit tobacco 2 months ago.  He denies any history of diabetes or hypercholesterolemia.  He has had no previous injury to his feet.  He is on Eliquis because he went into A. fib during that admission.  Past Medical History:  Diagnosis Date  . Acute hypoxemic respiratory failure (HCC)   . Acute  respiratory failure with hypoxia (HCC) 08/28/2020  . CAP (community acquired pneumonia) 08/28/2020  . Degenerative disc disease, lumbar   . Dental crown present   . ETOH abuse   . History of kidney stones   . Hypertension    states under control with meds., has been on med. x 1 yr. - is currently out of med., to see PCP 02/06/2018  . Marijuana use, continuous   . MRSA pneumonia (HCC) 09/09/2020  . Muscle atrophy 01/2018  . Muscle weakness 01/2018  . Necrotizing myopathy   . Necrotizing pneumonia (HCC) 09/09/2020  . Opioid use disorder   . Pleural effusion   . Sepsis (HCC) 08/28/2020  . Thoracic aortic aneurysm (TAA) (HCC)    a. seen on CT 08/2020.    Family History  Problem Relation Age of Onset  . Varicose Veins Brother   . Drug abuse Brother     SOCIAL HISTORY: Social History   Socioeconomic History  . Marital status: Married    Spouse name: Not on file  . Number of children: 1  . Years of education: 79  . Highest education level: Some college, no degree  Occupational History  . Occupation: not working  Tobacco Use  . Smoking status: Never Smoker  . Smokeless tobacco: Former Clinical biochemist  . Vaping Use: Never used  Substance and Sexual Activity  . Alcohol use: Yes    Comment: occasionally  . Drug use: No  .  Sexual activity: Yes    Partners: Female  Other Topics Concern  . Not on file  Social History Narrative   Lives with wife and daughter in a one story home.  Has one child.  Currently not working.  Education: some college.    Social Determinants of Health   Financial Resource Strain: Not on file  Food Insecurity: Not on file  Transportation Needs: Not on file  Physical Activity: Not on file  Stress: Not on file  Social Connections: Not on file  Intimate Partner Violence: Not on file    Allergies  Allergen Reactions  . Vicodin [Hydrocodone-Acetaminophen] Nausea And Vomiting    Current Outpatient Medications  Medication Sig Dispense Refill  .  acetaminophen (TYLENOL) 325 MG tablet Take 1-2 tablets (325-650 mg total) by mouth every 4 (four) hours as needed for mild pain.    Marland Kitchen. apixaban (ELIQUIS) 5 MG TABS tablet TAKE 1 TABLET BY MOUTH TWICE DAILY. 60 tablet 0  . atomoxetine (STRATTERA) 40 MG capsule TAKE 1 CAPSULE BY MOUTH DAILY FOR 3 DAYS, THEN INCREASE TO 2 CAPSULES BY MOUTH DAILY 60 capsule 2  . Buprenorphine HCl-Naloxone HCl 8-2 MG FILM TAKE 2 AND 1/2 FILMS SUBLINGUAL DAILY 75 each 1  . buprenorphine-naloxone (SUBOXONE) 8-2 mg SUBL SL tablet Place 1 tablet under the tongue 2 (two) times daily. 30 tablet   . clonazePAM (KLONOPIN) 0.5 MG tablet Take one tab twice a week for 7 days. Then decrease to one tab per day till gone. 21 tablet 0  . clotrimazole (LOTRIMIN) 1 % cream APPLY TOPICALLY 2 (TWO) TIMES DAILY. TO BILATERAL FEET 56.7 g 0  . docusate sodium (COLACE) 100 MG capsule TAKE 1 CAPSULE BY MOUTH TWICE DAILY. 60 capsule 0  . folic acid (FOLVITE) 1 MG tablet TAKE 1 TABLET (1 MG TOTAL) BY MOUTH DAILY. 30 tablet 0  . furosemide (LASIX) 40 MG tablet Take 1 tablet (40 mg total) by mouth daily. 30 tablet 0  . gabapentin (NEURONTIN) 300 MG capsule Take 3 capsules (900 mg total) by mouth 3 (three) times daily. 270 capsule 0  . hydrocerin (EUCERIN) CREA Apply 1 application topically 2 (two) times daily. 228 g 0  . linezolid (ZYVOX) 600 MG tablet Take 1 tablet (600 mg total) by mouth every 12 (twelve) hours. 13 tablet 0  . linezolid (ZYVOX) 600 MG tablet TAKE 1 TABLET (600 MG TOTAL) BY MOUTH EVERY 12 (TWELVE) HOURS. 13 tablet 0  . lip balm (CARMEX) ointment Apply topically as needed for lip care. 7 g 0  . loperamide (IMODIUM) 2 MG capsule Take 1 capsule (2 mg total) by mouth as needed for diarrhea or loose stools. 30 capsule 0  . metoprolol tartrate (LOPRESSOR) 25 MG tablet Take 0.5 tablets (12.5 mg total) by mouth 2 (two) times daily. 30 tablet 0  . Mouthwashes (MOUTH RINSE) LIQD solution 15 mLs by Mouth Rinse route 2 times daily at 12 noon  and 4 pm.  0  . Multiple Vitamin (MULTIVITAMIN WITH MINERALS) TABS tablet Take 1 tablet by mouth daily.    Marland Kitchen. nystatin (MYCOSTATIN/NYSTOP) powder Apply topically 2 (two) times daily. 60 g 0  . predniSONE (DELTASONE) 20 MG tablet TAKE 3 TABLETS BY MOUTH DAILY WITH BREAKFAST FOR 5 DAYS, 2 TABS FOR 5 DAYS, 1 TABLET FOR 5 DAYS, THEN 1/2 TAB DAILY FOR 5 DAYS 33 tablet 0  . QUEtiapine (SEROQUEL) 50 MG tablet TAKE 1 TABLET (50 MG TOTAL) BY MOUTH 2 (TWO) TIMES DAILY. 60 tablet 0  .  Skin Protectants, Misc. (EUCERIN) cream APPLY TOPICALLY TO AFFECTED AREA TWICE DAILY. (Patient taking differently: Apply topically 2 (two) times daily. to affected area) 228 g 0  . thiamine 100 MG tablet Take 1 tablet (100 mg total) by mouth daily. 30 tablet 0  . traMADol (ULTRAM) 50 MG tablet Take 1 tablet (50 mg total) by mouth every 6 (six) hours as needed (neuropathic pain in lower extremities). 20 tablet 0  . traZODone (DESYREL) 50 MG tablet Take 0.5-1 tablets (25-50 mg total) by mouth at bedtime as needed for sleep. 15 tablet 0   No current facility-administered medications for this visit.    REVIEW OF SYSTEMS:  [X]  denotes positive finding, [ ]  denotes negative finding Cardiac  Comments:  Chest pain or chest pressure:    Shortness of breath upon exertion: x   Short of breath when lying flat:    Irregular heart rhythm:        Vascular    Pain in calf, thigh, or hip brought on by ambulation:    Pain in feet at night that wakes you up from your sleep:  x   Blood clot in your veins:    Leg swelling:  x       Pulmonary    Oxygen at home:    Productive cough:     Wheezing:         Neurologic    Sudden weakness in arms or legs:     Sudden numbness in arms or legs:     Sudden onset of difficulty speaking or slurred speech:    Temporary loss of vision in one eye:     Problems with dizziness:         Gastrointestinal    Blood in stool:     Vomited blood:         Genitourinary    Burning when urinating:      Blood in urine:        Psychiatric    Major depression:         Hematologic    Bleeding problems:    Problems with blood clotting too easily:        Skin    Rashes or ulcers:        Constitutional    Fever or chills:     PHYSICAL EXAM:   Vitals:   10/12/20 1342  BP: 118/72  Pulse: 82  Resp: 20  Temp: 98 F (36.7 C)  SpO2: 94%  Weight: 195 lb (88.5 kg)  Height: 5\' 11"  (1.803 m)    GENERAL: The patient is a well-nourished male, in no acute distress. The vital signs are documented above. CARDIAC: There is a regular rate and rhythm.  VASCULAR: I do not detect carotid bruits. On the right side he has a palpable femoral, popliteal, dorsalis pedis, posterior tibial pulse. On the left side he has a palpable femoral, popliteal, and weakly palpable dorsalis pedis pulse. He has no significant lower extremity swelling. PULMONARY: There is good air exchange bilaterally without wheezing or rales. ABDOMEN: Soft and non-tender with normal pitched bowel sounds.  MUSCULOSKELETAL: There are no major deformities or cyanosis. NEUROLOGIC: No focal weakness or paresthesias are detected. SKIN: There are no ulcers or rashes noted. PSYCHIATRIC: The patient has a normal affect.  DATA:    ARTERIAL DOPPLER STUDY: I have independently interpreted his arterial Doppler study today.  On the right side there is a triphasic dorsalis pedis and posterior tibial signal.  ABIs greater than  100%.  Toe pressures 132 mmHg.  On the left side, there is a triphasic dorsalis pedis and posterior tibial signal.  The arteries are not compressible.

## 2020-10-13 DIAGNOSIS — M609 Myositis, unspecified: Secondary | ICD-10-CM | POA: Diagnosis not present

## 2020-10-13 DIAGNOSIS — R1310 Dysphagia, unspecified: Secondary | ICD-10-CM | POA: Diagnosis not present

## 2020-10-13 DIAGNOSIS — G629 Polyneuropathy, unspecified: Secondary | ICD-10-CM | POA: Diagnosis not present

## 2020-10-13 DIAGNOSIS — M5136 Other intervertebral disc degeneration, lumbar region: Secondary | ICD-10-CM | POA: Diagnosis not present

## 2020-10-13 DIAGNOSIS — J850 Gangrene and necrosis of lung: Secondary | ICD-10-CM | POA: Diagnosis not present

## 2020-10-13 DIAGNOSIS — M47816 Spondylosis without myelopathy or radiculopathy, lumbar region: Secondary | ICD-10-CM | POA: Diagnosis not present

## 2020-10-13 DIAGNOSIS — B9562 Methicillin resistant Staphylococcus aureus infection as the cause of diseases classified elsewhere: Secondary | ICD-10-CM | POA: Diagnosis not present

## 2020-10-13 DIAGNOSIS — G9340 Encephalopathy, unspecified: Secondary | ICD-10-CM | POA: Diagnosis not present

## 2020-10-13 DIAGNOSIS — G8929 Other chronic pain: Secondary | ICD-10-CM | POA: Diagnosis not present

## 2020-10-13 LAB — ACID FAST CULTURE WITH REFLEXED SENSITIVITIES (MYCOBACTERIA): Acid Fast Culture: NEGATIVE

## 2020-10-13 NOTE — Assessment & Plan Note (Signed)
Joseph Ayala is presenting today for follow up of his bilateral foot pain that has been present since his recent hospitalization. In February 2022, patient required ICU admission for necrotizing pneumonia. During his hospitalization, patient required mechanical ventilation, chest tube placement, vasopressors and eventually tracheostomy prior to discharge to inpatient rehab. During the hospitalization, patient developed rash on bilateral feet and signs of ischemic changes that the patient's wife describes as "turned white" while on vasopressors. This improved as he was weaned off the vasopressors and discharged to inpatient rehab. Patient started experiencing neuropathic pain which he describes as "hot pins and needles" since then. He was started on gabapentin for this and titrated up to 900mg  TID and is also taking tramadol, tylenol extra strength, Aspercreme and heat with only marginal relief.  At his previous visit, patient was referred to vascular surgery due to concerns for arterial insufficiency. He was evaluated on 4/6 by vascular surgery and noted to have multiphasic Doppler signals in both feet, palpable pulses, normal ABIs. On exam today, bilateral feet are warm to touch with palpable pedal pulses. There is extensive peeling of skin on bilateral plantar surfaces without any signs of infection at this time. He does have diminished sensation to light touch on the palmar surfaces of both feet, left >right. His symptoms are most consistent with peripheral neuropathy secondary to ischemic nerve injury. Patient given one dose of toradol 30mg  IM in clinic for pain relief which he notes gave him several hours of pain control last time.  Given only marginal relief with gabapentin, will switch to pregablin at this time.   Plan: - Toradol 30mg  IM today - Discontinue gabapentin 900mg  tid  - Start Lyrica 25mg  tid, uptitrate as needed

## 2020-10-13 NOTE — Progress Notes (Signed)
Internal Medicine Clinic Attending  Case discussed with Dr. Aslam at the time of the visit.  We reviewed the resident's history and exam and pertinent patient test results.  I agree with the assessment, diagnosis, and plan of care documented in the resident's note.  Bettie Swavely, M.D., Ph.D.  

## 2020-10-13 NOTE — Progress Notes (Signed)
Internal Medicine Clinic Attending  I saw and evaluated the patient.  I personally confirmed the key portions of the history and exam documented by Dr. Karilyn Cota and I reviewed pertinent patient test results.  The assessment, diagnosis, and plan were formulated together and I agree with the documentation in the resident's note. Concern for small vessel ischemic damage related to pressor support during ICU care for septic shock.  Pain is considerable, will refer for vascular opinion for further management given associated functional impairment due to pain.

## 2020-10-14 ENCOUNTER — Other Ambulatory Visit: Payer: Self-pay

## 2020-10-14 ENCOUNTER — Telehealth: Payer: Self-pay

## 2020-10-14 ENCOUNTER — Other Ambulatory Visit: Payer: Self-pay | Admitting: Student in an Organized Health Care Education/Training Program

## 2020-10-14 ENCOUNTER — Other Ambulatory Visit (HOSPITAL_COMMUNITY): Payer: Self-pay

## 2020-10-14 MED ORDER — TRAMADOL HCL 50 MG PO TABS: 50.0000 mg | ORAL_TABLET | Freq: Four times a day (QID) | ORAL | 0 refills | Status: DC | PRN

## 2020-10-14 NOTE — Telephone Encounter (Signed)
Need refill on traMADol (ULTRAM) 50 MG tablet  ;pt contact [phone]   [facility]

## 2020-10-14 NOTE — Telephone Encounter (Signed)
Pt is wanting to stop the pregabalin (LYRICA) 25 MG capsule and go back to taking ht e gabepentin [phone]

## 2020-10-14 NOTE — Telephone Encounter (Signed)
Return pt's call - stated he was started on Lyrica 2 days ago and he's having very bad side effects. Stated he's having very dry mouth, thrush, and sores in corner of his mouth. So he wants to go back on Gabapentin which he has some at home. Also  wants to know he can get a mouth rinse ? Thanks

## 2020-10-17 DIAGNOSIS — R1310 Dysphagia, unspecified: Secondary | ICD-10-CM | POA: Diagnosis not present

## 2020-10-17 DIAGNOSIS — G9340 Encephalopathy, unspecified: Secondary | ICD-10-CM | POA: Diagnosis not present

## 2020-10-17 DIAGNOSIS — J850 Gangrene and necrosis of lung: Secondary | ICD-10-CM | POA: Diagnosis not present

## 2020-10-17 DIAGNOSIS — G629 Polyneuropathy, unspecified: Secondary | ICD-10-CM | POA: Diagnosis not present

## 2020-10-17 DIAGNOSIS — G8929 Other chronic pain: Secondary | ICD-10-CM | POA: Diagnosis not present

## 2020-10-17 DIAGNOSIS — M5136 Other intervertebral disc degeneration, lumbar region: Secondary | ICD-10-CM | POA: Diagnosis not present

## 2020-10-17 DIAGNOSIS — B9562 Methicillin resistant Staphylococcus aureus infection as the cause of diseases classified elsewhere: Secondary | ICD-10-CM | POA: Diagnosis not present

## 2020-10-17 DIAGNOSIS — M47816 Spondylosis without myelopathy or radiculopathy, lumbar region: Secondary | ICD-10-CM | POA: Diagnosis not present

## 2020-10-17 DIAGNOSIS — M609 Myositis, unspecified: Secondary | ICD-10-CM | POA: Diagnosis not present

## 2020-10-18 DIAGNOSIS — R1310 Dysphagia, unspecified: Secondary | ICD-10-CM | POA: Diagnosis not present

## 2020-10-18 DIAGNOSIS — J850 Gangrene and necrosis of lung: Secondary | ICD-10-CM | POA: Diagnosis not present

## 2020-10-18 DIAGNOSIS — M609 Myositis, unspecified: Secondary | ICD-10-CM | POA: Diagnosis not present

## 2020-10-18 DIAGNOSIS — G8929 Other chronic pain: Secondary | ICD-10-CM | POA: Diagnosis not present

## 2020-10-18 DIAGNOSIS — B9562 Methicillin resistant Staphylococcus aureus infection as the cause of diseases classified elsewhere: Secondary | ICD-10-CM | POA: Diagnosis not present

## 2020-10-18 DIAGNOSIS — G629 Polyneuropathy, unspecified: Secondary | ICD-10-CM | POA: Diagnosis not present

## 2020-10-18 DIAGNOSIS — M47816 Spondylosis without myelopathy or radiculopathy, lumbar region: Secondary | ICD-10-CM | POA: Diagnosis not present

## 2020-10-18 DIAGNOSIS — G9340 Encephalopathy, unspecified: Secondary | ICD-10-CM | POA: Diagnosis not present

## 2020-10-18 DIAGNOSIS — M5136 Other intervertebral disc degeneration, lumbar region: Secondary | ICD-10-CM | POA: Diagnosis not present

## 2020-10-18 NOTE — Telephone Encounter (Signed)
This appears to have been discontinued on his last hospitalization. No clear reason given. Dr. Oswaldo Done should return on Thursday. Will defer to him on whether he wants to continue this or not

## 2020-10-18 NOTE — Telephone Encounter (Signed)
Called pt - no answer. Left message on self-identified vm to schedule an in-person appt to evaluate his side effects from Lyrica.

## 2020-10-19 ENCOUNTER — Other Ambulatory Visit (HOSPITAL_COMMUNITY): Payer: Self-pay

## 2020-10-19 NOTE — Telephone Encounter (Signed)
Thank you :)

## 2020-10-19 NOTE — Telephone Encounter (Signed)
Called pt about scheduling an appt for his c/o mouth sores and Lyrica. Pt stated he's not taking Lyrica anymore and he's back on Gabapentin. I asked about the mouth sores, stated they are gone. And he continues to c/o leg pain. He agreed to schedule an appt to discuss his pain and Gabapentin - appt scheduled with Dr Oswaldo Done on Monday 4/18 @ 0845 AM.

## 2020-10-20 DIAGNOSIS — M47816 Spondylosis without myelopathy or radiculopathy, lumbar region: Secondary | ICD-10-CM | POA: Diagnosis not present

## 2020-10-20 DIAGNOSIS — J850 Gangrene and necrosis of lung: Secondary | ICD-10-CM | POA: Diagnosis not present

## 2020-10-20 DIAGNOSIS — R1310 Dysphagia, unspecified: Secondary | ICD-10-CM | POA: Diagnosis not present

## 2020-10-20 DIAGNOSIS — M609 Myositis, unspecified: Secondary | ICD-10-CM | POA: Diagnosis not present

## 2020-10-20 DIAGNOSIS — G8929 Other chronic pain: Secondary | ICD-10-CM | POA: Diagnosis not present

## 2020-10-20 DIAGNOSIS — G629 Polyneuropathy, unspecified: Secondary | ICD-10-CM | POA: Diagnosis not present

## 2020-10-20 DIAGNOSIS — B9562 Methicillin resistant Staphylococcus aureus infection as the cause of diseases classified elsewhere: Secondary | ICD-10-CM | POA: Diagnosis not present

## 2020-10-20 DIAGNOSIS — G9340 Encephalopathy, unspecified: Secondary | ICD-10-CM | POA: Diagnosis not present

## 2020-10-20 DIAGNOSIS — M5136 Other intervertebral disc degeneration, lumbar region: Secondary | ICD-10-CM | POA: Diagnosis not present

## 2020-10-21 ENCOUNTER — Telehealth: Payer: Self-pay | Admitting: Internal Medicine

## 2020-10-21 ENCOUNTER — Other Ambulatory Visit (HOSPITAL_COMMUNITY): Payer: Self-pay

## 2020-10-21 MED ORDER — METOPROLOL TARTRATE 25 MG PO TABS
12.5000 mg | ORAL_TABLET | Freq: Two times a day (BID) | ORAL | 0 refills | Status: DC
  Filled 2020-10-21: qty 30, 30d supply, fill #0

## 2020-10-21 NOTE — Telephone Encounter (Signed)
[facility] 24 HOUR ANSWERING SERVICE  Pt requested a refill on metoprolol. He is prescribed 12.5mg  BID however has inadvertently been taking 25mg  BID so he ran out of this early. He denies any adverse effects from taking the higher dose.  I reviewed his chart. He is taking metoprolol for rate control for atrial fibrillation. He has follow up with cardiology on 5/5.  I sent in for a refill on the metoprolol. He is encouraged to go back to taking the prescribed dose until he follows up with cardiology.  , MD Internal Medicine Resident PGY-2 [facility] Pager: [phone] 10/21/2020 8:15 AM

## 2020-10-22 ENCOUNTER — Other Ambulatory Visit (HOSPITAL_COMMUNITY): Payer: Self-pay

## 2020-10-24 ENCOUNTER — Encounter: Payer: Medicare HMO | Admitting: Student in an Organized Health Care Education/Training Program

## 2020-10-24 NOTE — Chronic Care Management (AMB) (Signed)
  Care Management   Note  10/24/2020 Name: Joseph Ayala MRN: [id number] DOB: 1966-06-24  Joseph Ayala is a 55 y.o. year old male who is a primary care patient of [name], MD and is actively engaged with the care management team. I reached out to [facility] by phone today to assist with re-scheduling an initial visit with the RN Case Manager  Follow up plan: Telephone appointment with care management team member scheduled for:11/21/2020  [facility] Guide, Embedded Care Coordination [facility]  Care Management

## 2020-10-25 ENCOUNTER — Encounter: Payer: Self-pay | Admitting: *Deleted

## 2020-10-25 DIAGNOSIS — I509 Heart failure, unspecified: Secondary | ICD-10-CM

## 2020-10-25 DIAGNOSIS — I110 Hypertensive heart disease with heart failure: Secondary | ICD-10-CM

## 2020-10-25 DIAGNOSIS — D649 Anemia, unspecified: Secondary | ICD-10-CM

## 2020-10-25 DIAGNOSIS — R7401 Elevation of levels of liver transaminase levels: Secondary | ICD-10-CM

## 2020-10-25 DIAGNOSIS — M47816 Spondylosis without myelopathy or radiculopathy, lumbar region: Secondary | ICD-10-CM | POA: Diagnosis not present

## 2020-10-25 DIAGNOSIS — I712 Thoracic aortic aneurysm, without rupture: Secondary | ICD-10-CM

## 2020-10-25 DIAGNOSIS — I480 Paroxysmal atrial fibrillation: Secondary | ICD-10-CM

## 2020-10-25 DIAGNOSIS — Z7901 Long term (current) use of anticoagulants: Secondary | ICD-10-CM

## 2020-10-25 DIAGNOSIS — Z9181 History of falling: Secondary | ICD-10-CM

## 2020-10-25 DIAGNOSIS — E871 Hypo-osmolality and hyponatremia: Secondary | ICD-10-CM

## 2020-10-25 DIAGNOSIS — Z87891 Personal history of nicotine dependence: Secondary | ICD-10-CM

## 2020-10-25 DIAGNOSIS — R000 Tachycardia, unspecified: Secondary | ICD-10-CM

## 2020-10-25 DIAGNOSIS — M609 Myositis, unspecified: Secondary | ICD-10-CM | POA: Diagnosis not present

## 2020-10-25 DIAGNOSIS — G9340 Encephalopathy, unspecified: Secondary | ICD-10-CM | POA: Diagnosis not present

## 2020-10-25 DIAGNOSIS — J850 Gangrene and necrosis of lung: Secondary | ICD-10-CM | POA: Diagnosis not present

## 2020-10-25 DIAGNOSIS — G8929 Other chronic pain: Secondary | ICD-10-CM | POA: Diagnosis not present

## 2020-10-25 DIAGNOSIS — R1310 Dysphagia, unspecified: Secondary | ICD-10-CM | POA: Diagnosis not present

## 2020-10-25 DIAGNOSIS — G629 Polyneuropathy, unspecified: Secondary | ICD-10-CM | POA: Diagnosis not present

## 2020-10-25 DIAGNOSIS — M5136 Other intervertebral disc degeneration, lumbar region: Secondary | ICD-10-CM | POA: Diagnosis not present

## 2020-10-25 DIAGNOSIS — B9562 Methicillin resistant Staphylococcus aureus infection as the cause of diseases classified elsewhere: Secondary | ICD-10-CM | POA: Diagnosis not present

## 2020-10-25 DIAGNOSIS — F1010 Alcohol abuse, uncomplicated: Secondary | ICD-10-CM

## 2020-10-25 NOTE — Progress Notes (Unsigned)

## 2020-10-26 DIAGNOSIS — J850 Gangrene and necrosis of lung: Secondary | ICD-10-CM | POA: Diagnosis not present

## 2020-10-26 DIAGNOSIS — M609 Myositis, unspecified: Secondary | ICD-10-CM | POA: Diagnosis not present

## 2020-10-26 DIAGNOSIS — G8929 Other chronic pain: Secondary | ICD-10-CM | POA: Diagnosis not present

## 2020-10-26 DIAGNOSIS — G9340 Encephalopathy, unspecified: Secondary | ICD-10-CM | POA: Diagnosis not present

## 2020-10-26 DIAGNOSIS — M5136 Other intervertebral disc degeneration, lumbar region: Secondary | ICD-10-CM | POA: Diagnosis not present

## 2020-10-26 DIAGNOSIS — M47816 Spondylosis without myelopathy or radiculopathy, lumbar region: Secondary | ICD-10-CM | POA: Diagnosis not present

## 2020-10-26 DIAGNOSIS — G629 Polyneuropathy, unspecified: Secondary | ICD-10-CM | POA: Diagnosis not present

## 2020-10-26 DIAGNOSIS — R1310 Dysphagia, unspecified: Secondary | ICD-10-CM | POA: Diagnosis not present

## 2020-10-26 DIAGNOSIS — B9562 Methicillin resistant Staphylococcus aureus infection as the cause of diseases classified elsewhere: Secondary | ICD-10-CM | POA: Diagnosis not present

## 2020-10-27 ENCOUNTER — Other Ambulatory Visit: Payer: Self-pay

## 2020-10-27 ENCOUNTER — Institutional Professional Consult (permissible substitution) (INDEPENDENT_AMBULATORY_CARE_PROVIDER_SITE_OTHER): Payer: Medicare HMO | Admitting: Cardiothoracic Surgery

## 2020-10-27 ENCOUNTER — Other Ambulatory Visit (HOSPITAL_COMMUNITY): Payer: Self-pay

## 2020-10-27 VITALS — BP 104/69 | HR 98 | Temp 97.6°F | Resp 20 | Ht 71.0 in | Wt 192.0 lb

## 2020-10-27 DIAGNOSIS — I712 Thoracic aortic aneurysm, without rupture, unspecified: Secondary | ICD-10-CM

## 2020-10-27 MED ORDER — SILDENAFIL CITRATE 25 MG PO TABS
25.0000 mg | ORAL_TABLET | ORAL | 1 refills | Status: DC | PRN
  Filled 2020-10-27: qty 20, 20d supply, fill #0
  Filled 2020-12-09: qty 20, 20d supply, fill #1

## 2020-10-27 NOTE — Progress Notes (Unsigned)
Things That May Be Affecting Your Health:  Alcohol  Hearing loss x Pain   x Depression  Home Safety  Sexual Health   Diabetes  Lack of physical activity  Stress   Difficulty with daily activities  Loneliness  Tiredness   Drug use x Medicines  Tobacco use   Falls  Motor Vehicle Safety  Weight   Food choices  Oral Health  Other    YOUR PERSONALIZED HEALTH PLAN : 1. Schedule your next subsequent Medicare Wellness visit in one year 2. Attend all of your regular appointments to address your medical issues 3. Complete the preventative screenings and services   Annual Wellness Visit   Medicare Covered Preventative Screenings and Services  Services & Screenings Men and Women Who How Often Need? Date of Last Service Action  Abdominal Aortic Aneurysm Adults with AAA risk factors Once      Alcohol Misuse and Counseling All Adults Screening once a year if no alcohol misuse. Counseling up to 4 face to face sessions.     Bone Density Measurement  Adults at risk for osteoporosis Once every 2 yrs      Lipid Panel Z13.6 All adults without CV disease Once every 5 yrs       Colorectal Cancer   Stool sample or  Colonoscopy All adults 50 and older   Once every year  Every 10 years  Y Need to check on outside records      Depression All Adults Once a year  Today   Diabetes Screening Blood glucose, post glucose load, or GTT Z13.1  All adults at risk  Pre-diabetics  Once per year  Twice per year      Diabetes  Self-Management Training All adults Diabetics 10 hrs first year; 2 hours subsequent years. Requires Copay     Glaucoma  Diabetics  Family history of glaucoma  African Americans 50 yrs +  Hispanic Americans 65 yrs + Annually - requires coppay      Hepatitis C Z72.89 or F19.20  High Risk for HCV  Born between 1945 and 1965  Annually  Once      HIV Z11.4 All adults based on risk  Annually btw ages 33 & 2 regardless of risk  Annually > 65 yrs if at increased risk       Lung Cancer Screening Asymptomatic adults aged 54-77 with 30 pack yr history and current smoker OR quit within the last 15 yrs Annually Must have counseling and shared decision making documentation before first screen      Medical Nutrition Therapy Adults with   Diabetes  Renal disease  Kidney transplant within past 3 yrs 3 hours first year; 2 hours subsequent years     Obesity and Counseling All adults Screening once a year Counseling if BMI 30 or higher  Today   Tobacco Use Counseling Adults who use tobacco  Up to 8 visits in one year     Vaccines Z23  Hepatitis B  Influenza   Pneumonia  Adults   Once  Once every flu season  Two different vaccines separated by one year     Next Annual Wellness Visit People with Medicare Every year  Today     Services & Screenings Women Who How Often Need  Date of Last Service Action  Mammogram  Z12.31 Women over 40 One baseline ages 85-39. Annually ager 40 yrs+      Pap tests All women Annually if high risk. Every 2 yrs for normal risk  women      Screening for cervical cancer with   Pap (Z01.419 nl or Z01.411abnl) &  HPV Z11.51 Women aged 23 to 77 Once every 5 yrs     Screening pelvic and breast exams All women Annually if high risk. Every 2 yrs for normal risk women     Sexually Transmitted Diseases  Chlamydia  Gonorrhea  Syphilis All at risk adults Annually for non pregnant females at increased risk         Services & Screenings Men Who How Ofter Need  Date of Last Service Action  Prostate Cancer - DRE & PSA Men over 50 Annually.  DRE might require a copay.        Sexually Transmitted Diseases  Syphilis All at risk adults Annually for men at increased risk      Health Maintenance List Health Maintenance  Topic Date Due  . COLONOSCOPY (Pts 45-13yrs Insurance coverage will need to be confirmed)  Never done  . COVID-19 Vaccine (3 - Booster for Pfizer series) 05/03/2020  . INFLUENZA VACCINE  02/06/2021  .  TETANUS/TDAP  08/09/2024  . Hepatitis C Screening  Completed  . HIV Screening  Completed  . HPV VACCINES  Aged Out

## 2020-10-31 ENCOUNTER — Ambulatory Visit (INDEPENDENT_AMBULATORY_CARE_PROVIDER_SITE_OTHER): Payer: Medicare HMO | Admitting: Student in an Organized Health Care Education/Training Program

## 2020-10-31 ENCOUNTER — Other Ambulatory Visit (HOSPITAL_COMMUNITY): Payer: Self-pay

## 2020-10-31 ENCOUNTER — Encounter: Payer: Self-pay | Admitting: Student in an Organized Health Care Education/Training Program

## 2020-10-31 VITALS — BP 108/79 | HR 90 | Temp 97.8°F | Ht 71.0 in | Wt 190.8 lb

## 2020-10-31 DIAGNOSIS — M5136 Other intervertebral disc degeneration, lumbar region: Secondary | ICD-10-CM

## 2020-10-31 DIAGNOSIS — M792 Neuralgia and neuritis, unspecified: Secondary | ICD-10-CM | POA: Diagnosis not present

## 2020-10-31 DIAGNOSIS — I712 Thoracic aortic aneurysm, without rupture, unspecified: Secondary | ICD-10-CM

## 2020-10-31 DIAGNOSIS — I10 Essential (primary) hypertension: Secondary | ICD-10-CM

## 2020-10-31 DIAGNOSIS — G7289 Other specified myopathies: Secondary | ICD-10-CM | POA: Diagnosis not present

## 2020-10-31 DIAGNOSIS — I5021 Acute systolic (congestive) heart failure: Secondary | ICD-10-CM | POA: Insufficient documentation

## 2020-10-31 DIAGNOSIS — N529 Male erectile dysfunction, unspecified: Secondary | ICD-10-CM

## 2020-10-31 DIAGNOSIS — I480 Paroxysmal atrial fibrillation: Secondary | ICD-10-CM | POA: Diagnosis not present

## 2020-10-31 DIAGNOSIS — F1190 Opioid use, unspecified, uncomplicated: Secondary | ICD-10-CM

## 2020-10-31 HISTORY — DX: Acute systolic (congestive) heart failure: I50.21

## 2020-10-31 MED ORDER — BUPRENORPHINE HCL-NALOXONE HCL 8-2 MG SL FILM
1.0000 | ORAL_FILM | Freq: Two times a day (BID) | SUBLINGUAL | 1 refills | Status: DC
  Filled 2020-10-31: qty 60, 30d supply, fill #0
  Filled 2020-11-28 – 2020-12-02 (×2): qty 60, 30d supply, fill #1

## 2020-10-31 MED ORDER — GABAPENTIN 300 MG PO CAPS
300.0000 mg | ORAL_CAPSULE | Freq: Three times a day (TID) | ORAL | 1 refills | Status: DC
  Filled 2020-10-31 – 2020-11-03 (×4): qty 90, 30d supply, fill #0

## 2020-10-31 NOTE — Assessment & Plan Note (Signed)
44 mm thoracic aortic dilation, discovered on Echo in February 2022 and confirmed with CT. Had a surgical consultation with cardiothoracic surgery on 10/27/20 and surgeon advised against surgery at this time given his recent hospitalization for 1 month due to necrotizing pneumonia and ongoing recovery. Follow up CT in 1-2 years.

## 2020-10-31 NOTE — Assessment & Plan Note (Addendum)
Diagnosed during a critical illness. Echo from February 2022 showed global hypokinesis of LV with LVEF 35-40%, dilated LV cavity size, mildly reduced RV systolic function, mildly dilated LA and RA, mild mitral valve regurgitation, mild calcification of aortic valve with mild aortic sclerosis, aortic dilation measuring 53mm, and dilated IVC.   Currently taking metoprolol 25mg  and scheduled to follow up with cardiology next week. Looks to be euvolemic on exam, well compensated, and improving exertional capacity.   Follow up in 3 months for repeat echo. I anticipate the EF will improve with time, and that the metoprolol can be eventually discontinued.

## 2020-10-31 NOTE — Assessment & Plan Note (Signed)
Currently well controlled with Suboxone twice daily. Denies any relapses and reports being highly motivated to continue avoiding opioids. No adverse side effects of Suboxone.   A&P: Well-controlled opioid use disorder  - Refilled Suboxone twice daily

## 2020-10-31 NOTE — Assessment & Plan Note (Signed)
Patient reports ongoing low back pain, complicated by recent hospitalization and immobilization for one month. He has been taking tylenol 3000 mg daily in addition to gabapentin 900 mg daily. He reports that he is still in significant discomfort despite these medications and has been using a back brace.   A&P: Chronic, non function limiting back pain worsened by recent immobilization and likely to improve as patient recovers from recent hospitalization.  - Continue Tylenol 3000 mg daily and gabapentin 900 mg daily - Continue daily activity and back strengthening exercises - Recommended avoiding back braces as they can result in increased stiffness and pain long term

## 2020-10-31 NOTE — Progress Notes (Signed)
Attestation for Student Documentation:  I personally was present and performed or re-performed the history, physical exam and medical decision-making activities of this service and have verified that the service and findings are accurately documented in the student's note.  Patient with OUD and recent critical illness due to necrotizing pneumonia and complicated right sided pleural effusion. He is recovering at home now, certainly paid a high price for this admission. He has lost muscle mass, has residual fatigue, neuropathy, and new diagnoses of HFmrEF and atrial fibrillation. These were related to the critical illness, and based on his exam and lack of symptoms, I suspect they will resolve now that he is recovering. We talked for a while about setting reasonable expectations for his recovery, taking it slowly. His neuropathy is stable, with some improvement with gabapentin, I anticipate this will improve more in the coming weeks. We spent a lot of time reviewing his medications, I have refilled his gabapentin and suboxone.   [name], MD 10/31/2020, 3:07 PM

## 2020-10-31 NOTE — Progress Notes (Signed)
This is a Psychologist, occupational Note.  The care of the patient was discussed with Dr. Alvin Critchley and the assessment and plan was formulated with their assistance.    Subjective:   Patient ID: Joseph Ayala male   DOB: 1966-04-21 55 y.o.   MRN: [id number]  HPI: Joseph Ayala is a 55 y.o. male with a past medical history as listed below who presents for a follow up after a recent 4 week hospitalization for necrotizing pneumonia complicated by mechanical ventilation, chest tube placement, vasopressors, and tracheostomy. He is still recovering from this illness and experiencing various sequelae such as generalized loss of muscle mass, weakness, and peripheral neuropathy.   See problem based charting for more details.   Past Medical History:  Diagnosis Date  . Acute hypoxemic respiratory failure (HCC)   . Acute respiratory failure with hypoxia (HCC) 08/28/2020  . CAP (community acquired pneumonia) 08/28/2020  . Degenerative disc disease, lumbar   . Dental crown present   . ETOH abuse   . History of kidney stones   . Hypertension    states under control with meds., has been on med. x 1 yr. - is currently out of med., to see PCP 02/06/2018  . Marijuana use, continuous   . MRSA pneumonia (HCC) 09/09/2020  . Muscle atrophy 01/2018  . Muscle weakness 01/2018  . Necrotizing myopathy   . Necrotizing pneumonia (HCC) 09/09/2020  . Opioid use disorder   . Pleural effusion   . Sepsis (HCC) 08/28/2020  . Thoracic aortic aneurysm (TAA) (HCC)    a. seen on CT 08/2020.   Current Outpatient Medications  Medication Sig Dispense Refill  . gabapentin (NEURONTIN) 300 MG capsule Take 1 capsule (300 mg total) by mouth 3 (three) times daily. 90 capsule 1  . apixaban (ELIQUIS) 5 MG TABS tablet TAKE 1 TABLET BY MOUTH TWICE DAILY. 60 tablet 0  . Buprenorphine HCl-Naloxone HCl 8-2 MG FILM Place 1 Film under the tongue in the morning and at bedtime. 60 each 1  . furosemide (LASIX) 40 MG tablet Take 1 tablet (40 mg total)  by mouth daily. 30 tablet 0  . metoprolol tartrate (LOPRESSOR) 25 MG tablet Take 1/2 tablet by mouth 2 times daily. 30 tablet 0  . QUEtiapine (SEROQUEL) 50 MG tablet TAKE 1 TABLET (50 MG TOTAL) BY MOUTH 2 (TWO) TIMES DAILY. 60 tablet 0  . sildenafil (VIAGRA) 25 MG tablet Take 1 tablet (25 mg total) by mouth as needed for erectile dysfunction. 20 tablet 1  . testosterone cypionate (DEPOTESTOSTERONE CYPIONATE) 200 MG/ML injection INJECT 2 MLS INTO THE MUSCLE EVERY 2 WEEKS 4 mL 1   No current facility-administered medications for this visit.   Family History  Problem Relation Age of Onset  . Varicose Veins Brother   . Drug abuse Brother    Social History   Socioeconomic History  . Marital status: Married    Spouse name: Not on file  . Number of children: 1  . Years of education: 68  . Highest education level: Some college, no degree  Occupational History  . Occupation: not working  Tobacco Use  . Smoking status: Never Smoker  . Smokeless tobacco: Former Clinical biochemist  . Vaping Use: Never used  Substance and Sexual Activity  . Alcohol use: Yes    Comment: occasionally  . Drug use: No  . Sexual activity: Yes    Partners: Female  Other Topics Concern  . Not on file  Social History Narrative  Lives with wife and daughter in a one story home.  Has one child.  Currently not working.  Education: some college.    Social Determinants of Health   Financial Resource Strain: Not on file  Food Insecurity: Not on file  Transportation Needs: Not on file  Physical Activity: Not on file  Stress: Not on file  Social Connections: Not on file   Review of Systems: Pertinent items noted in HPI and remainder of comprehensive ROS otherwise negative. Objective:  Physical Exam: Vitals:   10/31/20 0949  BP: 108/79  Pulse: 90  Temp: 97.8 F (36.6 C)  TempSrc: Oral  SpO2: 97%  Weight: 190 lb 12.8 oz (86.5 kg)  Height: 5\' 11"  (1.803 m)   General: Well appearing and in no acute  distress, appears stated age Neuro: A&O x4, normal affect Cardiovascular: RRR, no m/r/g. Normal S1 and S2 without S3 or S4. Pulses 2+ in bilateral UE and LE. No peripheral edema Pulmonary: CTAB, no wheezes, rhonchi or rales. Normal WOB, no clubbing  Abdominal: Abdomen soft and non-distended. Normal bowel sounds in all four quadrants. No tenderness to palpation, guarding, or rebound tenderness Skin: Warm and dry. Skin peeling of bilateral plantar surfaces of feet, healing scar on left side of chest from chest tube placement, scar on midline of throat from tracheostomy placement.  MSK: Normal ROM of all extremities. Strength 5/5 in bilateral UE and LE  Assessment & Plan:  See problem based charting for more details

## 2020-10-31 NOTE — Assessment & Plan Note (Addendum)
Atrial fibrillation discovered while hospitalized for necrotizing pneumonia in February 2022, complicated by mildly reduced EF. Currently taking apixaban 5mg . Denies palpitations.  A&P: Likely resolved, RRR on exam today. - Follow up scheduled with Cardiology for next week  - If his follow up echo shows improvement in EF, it seems like this was being driven by the acute illness and will be unlikely to recur. I anticipate we will be able to bring him off apixaban in the future.

## 2020-10-31 NOTE — Assessment & Plan Note (Addendum)
Doing well symptomatically. Denies myalgias but reports having significant loss of muscle mass and associated strength due to 4-week hospitalization in February 2022. Has been increasing caloric intake with milk and wants to gain back lost weight.   A&P: Improved symptomatically - Discussed body's physiologic response to a severe illness such as patient's recent one month hospitalization due to necrotizing pneumonia - Recommended against gaining weight back quickly and discussed gaining weight as muscle mass v. Fat - Will continue to monitor and manage symptomatically

## 2020-10-31 NOTE — Assessment & Plan Note (Addendum)
ED in the setting of recent critical illness, likely vascular in nature, he also has low testosterone which we are treating. I have prescribed viagra, and given instructions about taking on an empty stomach at least 1-2 hours before intercourse.

## 2020-10-31 NOTE — Assessment & Plan Note (Signed)
Bilateral peripheral neuropathy since hospitalization in February 2022 for necrotizing pneumonia. Patient was septic during the hospital stay and pressors were used for BP control. Since then, patient has been complaining of neuropathic pain in both feet. Has been evaluated by vascular surgery due to concerns for arterial insufficiency but was found to have multiphasic Doppler signals in both feet, palpable pulses, normal ABIs. He has been using gabapentin 900 mg daily, tylenol 3000 mg daily, and Aspercreme for pain relief. He gets the most benefit from Aspercreme.  On exam today, DP pulses were 2+ bilaterally and there was skin peeling of the plantar surfaces. No signs of infection.   A&P: Likely neuropathic pain secondary to nerve injury from use of pressors during recent hospitalization. Will likely improve in the next few months as patient's body recovers.  - Continue tylenol 3000 mg, gabapentin 900 mg, and Aspercreme - Discussed supportive care during recovery process

## 2020-10-31 NOTE — Assessment & Plan Note (Signed)
Well-controlled with metoprolol 25 mg and low-salt diet. BP today 108/79. Continue metoprolol and follow up in 3 months.

## 2020-11-01 ENCOUNTER — Telehealth: Payer: Medicare HMO

## 2020-11-01 DIAGNOSIS — G629 Polyneuropathy, unspecified: Secondary | ICD-10-CM | POA: Diagnosis not present

## 2020-11-01 DIAGNOSIS — M47816 Spondylosis without myelopathy or radiculopathy, lumbar region: Secondary | ICD-10-CM | POA: Diagnosis not present

## 2020-11-01 DIAGNOSIS — M609 Myositis, unspecified: Secondary | ICD-10-CM | POA: Diagnosis not present

## 2020-11-01 DIAGNOSIS — M5136 Other intervertebral disc degeneration, lumbar region: Secondary | ICD-10-CM | POA: Diagnosis not present

## 2020-11-01 DIAGNOSIS — B9562 Methicillin resistant Staphylococcus aureus infection as the cause of diseases classified elsewhere: Secondary | ICD-10-CM | POA: Diagnosis not present

## 2020-11-01 DIAGNOSIS — G9340 Encephalopathy, unspecified: Secondary | ICD-10-CM | POA: Diagnosis not present

## 2020-11-01 DIAGNOSIS — G8929 Other chronic pain: Secondary | ICD-10-CM | POA: Diagnosis not present

## 2020-11-01 DIAGNOSIS — R1310 Dysphagia, unspecified: Secondary | ICD-10-CM | POA: Diagnosis not present

## 2020-11-01 DIAGNOSIS — J850 Gangrene and necrosis of lung: Secondary | ICD-10-CM | POA: Diagnosis not present

## 2020-11-02 ENCOUNTER — Other Ambulatory Visit: Payer: Self-pay | Admitting: Student in an Organized Health Care Education/Training Program

## 2020-11-02 ENCOUNTER — Other Ambulatory Visit (HOSPITAL_COMMUNITY): Payer: Self-pay

## 2020-11-02 NOTE — Progress Notes (Signed)
[address]       [place] [address]             [phone]     CARDIOTHORACIC SURGERY office visitation  Referring Provider is Love, Evlyn Kanner, PA-C Primary Cardiologist is [name], MD PCP is [name], MD  Chief Complaint  Patient presents with  . Thoracic Aortic Aneurysm    Surgical consult, CT Chest 09/13/20, TEE 09/16/20    HPI:  55 year old man was recently admitted to the hospital for severe pneumonia.  He required prolonged intubation, tracheostomy and significant postoperative recovery.  During his evaluation he was found to have a asymptomatic ascending aortic aneurysm.  This measures approximately 4.3 cm in maximal diameter.  The patient has no prior personal history of aneurysm nor any significant history of family aneurysms.  He has been convalescing reasonably well but is still not back to baseline.  During his hospitalization for pneumonia, he underwent echocardiogram which demonstrated a normal aortic valve morphology.  Past Medical History:  Diagnosis Date  . Acute hypoxemic respiratory failure (HCC)   . Acute respiratory failure with hypoxia (HCC) 08/28/2020  . CAP (community acquired pneumonia) 08/28/2020  . Degenerative disc disease, lumbar   . Dental crown present   . ETOH abuse   . History of kidney stones   . Hypertension    states under control with meds., has been on med. x 1 yr. - is currently out of med., to see PCP 02/06/2018  . Marijuana use, continuous   . MRSA pneumonia (HCC) 09/09/2020  . Muscle atrophy 01/2018  . Muscle weakness 01/2018  . Necrotizing myopathy   . Necrotizing pneumonia (HCC) 09/09/2020  . Opioid use disorder   . Pleural effusion   . Sepsis (HCC) 08/28/2020  . Thoracic aortic aneurysm (TAA) (HCC)    a. seen on CT 08/2020.    Past Surgical History:  Procedure Laterality Date  . ENDOVENOUS ABLATION SAPHENOUS VEIN W/ LASER Right 12-29-2015   endovenous laser ablation right greater  saphenous vein, stab phlebectomy > 20 incisions right leg, sclerotherapy right leg by Gretta Began MD    . MINOR MUSCLE BIOPSY Right 02/11/2018   Procedure: RECTUS FEMORIS MUSCLE BIOPSY;  Surgeon: Darnell Level, MD;  Location: [facility];  Service: General;  Laterality: Right;  . MUSCLE BIOPSY Right 02/11/2018   Procedure: DELTOID MUSCLE BIOPSY;  Surgeon: Darnell Level, MD;  Location: [facility];  Service: General;  Laterality: Right;  . NASAL FRACTURE SURGERY    . TRACHEOSTOMY TUBE PLACEMENT N/A 09/14/2020   Procedure: TRACHEOSTOMY;  Surgeon: Suzanna Obey, MD;  Location: [facility];  Service: ENT;  Laterality: N/A;    Family History  Problem Relation Age of Onset  . Varicose Veins Brother   . Drug abuse Brother     Social History   Socioeconomic History  . Marital status: Married    Spouse name: Not on file  . Number of children: 1  . Years of education: 37  . Highest education level: Some college, no degree  Occupational History  . Occupation: not working  Tobacco Use  . Smoking status: Never Smoker  . Smokeless tobacco: Former Clinical biochemist  . Vaping Use: Never used  Substance and Sexual Activity  . Alcohol use: Yes    Comment: occasionally  . Drug use: No  . Sexual activity: Yes    Partners: Female  Other Topics Concern  . Not  on file  Social History Narrative   Lives with wife and daughter in a one story home.  Has one child.  Currently not working.  Education: some college.    Social Determinants of Health   Financial Resource Strain: Not on file  Food Insecurity: Not on file  Transportation Needs: Not on file  Physical Activity: Not on file  Stress: Not on file  Social Connections: Not on file  Intimate Partner Violence: Not on file    Current Outpatient Medications  Medication Sig Dispense Refill  . apixaban (ELIQUIS) 5 MG TABS tablet TAKE 1 TABLET BY MOUTH TWICE DAILY. 60 tablet 0  . furosemide (LASIX) 40 MG tablet Take 1 tablet (40  mg total) by mouth daily. 30 tablet 0  . metoprolol tartrate (LOPRESSOR) 25 MG tablet Take 1/2 tablet by mouth 2 times daily. 30 tablet 0  . QUEtiapine (SEROQUEL) 50 MG tablet TAKE 1 TABLET (50 MG TOTAL) BY MOUTH 2 (TWO) TIMES DAILY. 60 tablet 0  . sildenafil (VIAGRA) 25 MG tablet Take 1 tablet (25 mg total) by mouth as needed for erectile dysfunction. 20 tablet 1  . testosterone cypionate (DEPOTESTOSTERONE CYPIONATE) 200 MG/ML injection INJECT 2 MLS INTO THE MUSCLE EVERY 2 WEEKS 4 mL 1  . Buprenorphine HCl-Naloxone HCl 8-2 MG FILM Place 1 Film under the tongue in the morning and at bedtime. 60 each 1  . gabapentin (NEURONTIN) 300 MG capsule Take 1 capsule (300 mg total) by mouth 3 (three) times daily. 90 capsule 1   No current facility-administered medications for this visit.    Allergies  Allergen Reactions  . Vicodin [Hydrocodone-Acetaminophen] Nausea And Vomiting      Review of Systems:   General:  Decreased energy since his pneumonia bout  Cardiac:  Endorses recent dizzy spells and recovering from pneumonia  Respiratory:  As per HPI  GI:   No abdominal pain or blood  GU:   History of kidney stones  Vascular:  History of leg pain attributed to myopathy of chronic disease  Neuro:   History of neuropathy attributed to chronic disease  Musculoskeletal: Endorses back pain  Skin:   Negative  Psych:   Recent unusual stress as noted  Eyes:   Negative  ENT:   Negative  Hematologic:  Negative  Endocrine:  No diabetes or thyroid disease     Physical Exam:   BP 104/69   Pulse 98   Temp 97.6 F (36.4 C) (Skin)   Resp 20   Ht 5\' 11"  (1.803 m)   Wt 87.1 kg   SpO2 90% Comment: RA  BMI 26.78 kg/m   General:    well-appearing  HEENT:  Healing tracheotomy site  Neck:   no JVD, no bruits, no adenopathy   Chest:   Diminished breath sounds right chest  CV:   RRR, no murmur   Abdomen:  soft, non-tender, no masses  Extremities:  warm, well-perfused, pulses intact throughout, no LE  edema  Rectal/GU  Deferred  Neuro:   Grossly non-focal and symmetrical throughout  Skin:   Clean and dry, no rashes, no breakdown   Diagnostic Tests:  I personally reviewed his available imaging studies including CT scan of the chest from 09/13/2020 and agree with the interpretation as a sub-5 cm ascending aortic aneurysm   Impression:  55 year old man recovering from recent debilitating pneumonia and has an incidentally discovered sub-5 cm ascending aortic aneurysm.  This poses little risk for urgent aortic condition based on his overall medical condition  and small size   Plan:  Follow-up with CT scan in 1 year  Continue blood pressure control Avoid smoking  I spent in excess of 30 minutes during the conduct of this office consultation and >50% of this time involved direct face-to-face encounter with the patient for counseling and/or coordination of their care.          Level 3 Office Consult = 40 minutes         Level 4 Office Consult = 60 minutes         Level 5 Office Consult = 80 minutes  B.  Lorayne Marek, MD 11/02/2020 9:52 AM

## 2020-11-02 NOTE — Telephone Encounter (Signed)
Pt states the pharmacy is waiting for reply on the meds for refill.  States he does not know the name of the meds, requesting the nurse to contact the pharmacy. Pt using  [facility] Phone:  [phone]  Fax:  [phone]

## 2020-11-03 ENCOUNTER — Other Ambulatory Visit (HOSPITAL_COMMUNITY): Payer: Self-pay

## 2020-11-03 MED ORDER — GABAPENTIN 300 MG PO CAPS
900.0000 mg | ORAL_CAPSULE | Freq: Three times a day (TID) | ORAL | 1 refills | Status: DC
  Filled 2020-11-03 – 2020-11-17 (×2): qty 270, 30d supply, fill #0

## 2020-11-03 MED ORDER — ELIQUIS 5 MG PO TABS
5.0000 mg | ORAL_TABLET | Freq: Two times a day (BID) | ORAL | 0 refills | Status: DC
  Filled 2020-11-03: qty 60, 30d supply, fill #0

## 2020-11-03 MED ORDER — QUETIAPINE FUMARATE 50 MG PO TABS
50.0000 mg | ORAL_TABLET | Freq: Two times a day (BID) | ORAL | 0 refills | Status: DC
  Filled 2020-11-03: qty 60, 30d supply, fill #0

## 2020-11-03 MED ORDER — FUROSEMIDE 40 MG PO TABS
40.0000 mg | ORAL_TABLET | Freq: Every day | ORAL | 0 refills | Status: DC
  Filled 2020-11-03: qty 30, 30d supply, fill #0

## 2020-11-03 NOTE — Addendum Note (Signed)
Addended by: Erlinda Hong T on: 11/03/2020 10:10 AM   Modules accepted: Orders

## 2020-11-03 NOTE — Telephone Encounter (Signed)
Pt want to speak with a nurse about gabapentin (NEURONTIN) 300 MG capsule. Please call back.

## 2020-11-07 ENCOUNTER — Other Ambulatory Visit (HOSPITAL_COMMUNITY): Payer: Self-pay

## 2020-11-09 DIAGNOSIS — G9340 Encephalopathy, unspecified: Secondary | ICD-10-CM | POA: Diagnosis not present

## 2020-11-09 DIAGNOSIS — M5136 Other intervertebral disc degeneration, lumbar region: Secondary | ICD-10-CM | POA: Diagnosis not present

## 2020-11-09 DIAGNOSIS — G629 Polyneuropathy, unspecified: Secondary | ICD-10-CM | POA: Diagnosis not present

## 2020-11-09 DIAGNOSIS — J850 Gangrene and necrosis of lung: Secondary | ICD-10-CM | POA: Diagnosis not present

## 2020-11-09 DIAGNOSIS — R1310 Dysphagia, unspecified: Secondary | ICD-10-CM | POA: Diagnosis not present

## 2020-11-09 DIAGNOSIS — B9562 Methicillin resistant Staphylococcus aureus infection as the cause of diseases classified elsewhere: Secondary | ICD-10-CM | POA: Diagnosis not present

## 2020-11-09 DIAGNOSIS — M609 Myositis, unspecified: Secondary | ICD-10-CM | POA: Diagnosis not present

## 2020-11-09 DIAGNOSIS — M47816 Spondylosis without myelopathy or radiculopathy, lumbar region: Secondary | ICD-10-CM | POA: Diagnosis not present

## 2020-11-09 DIAGNOSIS — G8929 Other chronic pain: Secondary | ICD-10-CM | POA: Diagnosis not present

## 2020-11-10 ENCOUNTER — Ambulatory Visit: Payer: Medicare HMO

## 2020-11-10 ENCOUNTER — Ambulatory Visit: Payer: Medicare HMO | Admitting: Cardiology

## 2020-11-10 ENCOUNTER — Other Ambulatory Visit: Payer: Self-pay

## 2020-11-10 ENCOUNTER — Encounter: Payer: Self-pay | Admitting: Cardiology

## 2020-11-10 VITALS — BP 118/68 | HR 89 | Ht 71.0 in | Wt 200.4 lb

## 2020-11-10 DIAGNOSIS — I712 Thoracic aortic aneurysm, without rupture, unspecified: Secondary | ICD-10-CM

## 2020-11-10 DIAGNOSIS — I5020 Unspecified systolic (congestive) heart failure: Secondary | ICD-10-CM | POA: Diagnosis not present

## 2020-11-10 DIAGNOSIS — I4891 Unspecified atrial fibrillation: Secondary | ICD-10-CM | POA: Diagnosis not present

## 2020-11-10 DIAGNOSIS — I5041 Acute combined systolic (congestive) and diastolic (congestive) heart failure: Secondary | ICD-10-CM

## 2020-11-10 MED ORDER — FUROSEMIDE 40 MG PO TABS: 40.0000 mg | ORAL_TABLET | ORAL | 3 refills | Status: DC | PRN

## 2020-11-10 NOTE — Progress Notes (Unsigned)
Patient enrolled for [employer] to ship a 14 day ZIO XT long term holter monitor to his home. 

## 2020-11-10 NOTE — Progress Notes (Deleted)
Cardiology Office Note:    Date:  11/10/2020   ID:  Joseph Ayala, DOB 06-May-1966, MRN [id number]  PCP:  [name], MD  Cardiologist:  [name], MD  Electrophysiologist:  None   Referring MD: [name], PA-C   No chief complaint on file. ***  History of Present Illness:    Joseph Ayala is a 55 y.o. male with a hx of hypertension, necrotizing myopathy, opioid use disorder on Suboxone, alcohol abuse,  He was admitted on 08/27/2020 with shortness of breath.  Found to be hypoxic, chest x-ray showed right lower lobe pneumonia.  CTPA showed no PE but dense area of consolidation and groundglass opacity in right middle and lower lobes.  Developed worsening hypoxia requiring intubation on 2/20.  Was found to have septic shock requiring pressor support.  Respiratory cultures grew MRSA.  Hospital course was complicated by atrial fibrillation, which is a new diagnosis.  Echocardiogram on 2/22 showed EF 35 to 40%, mild RV dysfunction.  He was started on amiodarone and converted to normal sinus rhythm on 2/22.  He required chest tube placement for right pleural effusion.  He was admitted from 2/19 through 09/29/2020.  He required tracheostomy but was removed on 3/22 he was discharged on oral linezolid and MRSA pneumonia.  He was discharged on metoprolol, Lasix, and lisinopril.  TEE on 09/16/2020 showed no evidence of endocarditis.  Likely small PFO.  EF 45 to 50%, low normal RV function.  Also noted to have 4.6 cm thoracic aortic aneurysm.  Wt Readings from Last 3 Encounters:  11/10/20 200 lb 6.4 oz (90.9 kg)  10/31/20 190 lb 12.8 oz (86.5 kg)  10/27/20 192 lb (87.1 kg)     Past Medical History:  Diagnosis Date  . Acute hypoxemic respiratory failure (HCC)   . Acute respiratory failure with hypoxia (HCC) 08/28/2020  . CAP (community acquired pneumonia) 08/28/2020  . Degenerative disc disease, lumbar   . Dental crown present   . ETOH abuse   . History of kidney  stones   . Hypertension    states under control with meds., has been on med. x 1 yr. - is currently out of med., to see PCP 02/06/2018  . Marijuana use, continuous   . MRSA pneumonia (HCC) 09/09/2020  . Muscle atrophy 01/2018  . Muscle weakness 01/2018  . Necrotizing myopathy   . Necrotizing pneumonia (HCC) 09/09/2020  . Opioid use disorder   . Pleural effusion   . Sepsis (HCC) 08/28/2020  . Thoracic aortic aneurysm (TAA) (HCC)    a. seen on CT 08/2020.    Past Surgical History:  Procedure Laterality Date  . ENDOVENOUS ABLATION SAPHENOUS VEIN W/ LASER Right 12-29-2015   endovenous laser ablation right greater saphenous vein, stab phlebectomy > 20 incisions right leg, sclerotherapy right leg by Gretta Began MD    . MINOR MUSCLE BIOPSY Right 02/11/2018   Procedure: RECTUS FEMORIS MUSCLE BIOPSY;  Surgeon: Darnell Level, MD;  Location: [facility];  Service: General;  Laterality: Right;  . MUSCLE BIOPSY Right 02/11/2018   Procedure: DELTOID MUSCLE BIOPSY;  Surgeon: Darnell Level, MD;  Location: [facility];  Service: General;  Laterality: Right;  . NASAL FRACTURE SURGERY    . TRACHEOSTOMY TUBE PLACEMENT N/A 09/14/2020   Procedure: TRACHEOSTOMY;  Surgeon: Suzanna Obey, MD;  Location: [facility];  Service: ENT;  Laterality: N/A;    Current Medications: No outpatient medications have been marked as taking for the 11/10/20 encounter (  Appointment) with [name], MD.     Allergies:   Vicodin [hydrocodone-acetaminophen]   Social History   Socioeconomic History  . Marital status: Married    Spouse name: Not on file  . Number of children: 1  . Years of education: 89  . Highest education level: Some college, no degree  Occupational History  . Occupation: not working  Tobacco Use  . Smoking status: Never Smoker  . Smokeless tobacco: Former Clinical biochemist  . Vaping Use: Never used  Substance and Sexual Activity  . Alcohol use: Yes    Comment: occasionally  .  Drug use: No  . Sexual activity: Yes    Partners: Female  Other Topics Concern  . Not on file  Social History Narrative   Lives with wife and daughter in a one story home.  Has one child.  Currently not working.  Education: some college.    Social Determinants of Health   Financial Resource Strain: Not on file  Food Insecurity: Not on file  Transportation Needs: Not on file  Physical Activity: Not on file  Stress: Not on file  Social Connections: Not on file     Family History: The patient's family history includes Drug abuse in his brother; Varicose Veins in his brother.  ROS:   Please see the history of present illness.     All other systems reviewed and are negative.  EKGs/Labs/Other Studies Reviewed:    The following studies were reviewed today:   EKG:  EKG is ordered today.  The ekg ordered today demonstrates   Recent Labs: 08/28/2020: B Natriuretic Peptide 114.2 09/02/2020: TSH 3.379 09/27/2020: Magnesium 2.1 10/03/2020: ALT 81; BUN 9; Creatinine, Ser 0.53; Hemoglobin 9.4; Platelets 224; Potassium 3.9; Sodium 135  Recent Lipid Panel    Component Value Date/Time   CHOL 202 (H) 09/01/2019 1053   TRIG 184 (H) 09/14/2020 0630   HDL 61 09/01/2019 1053   CHOLHDL 3.3 09/01/2019 1053   LDLCALC 111 (H) 09/01/2019 1053    Physical Exam:    VS:  There were no vitals taken for this visit.    Wt Readings from Last 3 Encounters:  10/31/20 190 lb 12.8 oz (86.5 kg)  10/27/20 192 lb (87.1 kg)  10/12/20 197 lb 9.6 oz (89.6 kg)     GEN: *** Well nourished, well developed in no acute distress HEENT: Normal NECK: No JVD; No carotid bruits LYMPHATICS: No lymphadenopathy CARDIAC: ***RRR, no murmurs, rubs, gallops RESPIRATORY:  Clear to auscultation without rales, wheezing or rhonchi  ABDOMEN: Soft, non-tender, non-distended MUSCULOSKELETAL:  No edema; No deformity  SKIN: Warm and dry NEUROLOGIC:  Alert and oriented x 3 PSYCHIATRIC:  Normal affect   ASSESSMENT:    No  diagnosis found. PLAN:    Atrial fibrillation: Occurred in setting of acute illness.  Started on Eliquis 5 mg twice daily -Suspect A. fib was due to critical illness and will likely not need long-term anticoagulation.  Will check Zio patch x2 weeks to evaluate for further A. fib  Acute heart failure with reduced ejection fraction: EF 35 to 40% 2/20.  TEE 3/11 showed improvement 45 to 50%.  On metoprolol 12.5 mg twice daily, Lasix 40 mg daily -Check echocardiogram to evaluate for recovery of systolic function with resolution of critical illness -Appears euvolemic.  Can change Lasix to 40 mg as needed.  Advised to monitor daily weights and take Lasix if gains more than 3 pounds in 1 day or 5 pounds in 1  week  Thoracic aortic aneurysm: Measured 4.6 cm.  Follows with thoracic surgery, repeat CT chest in 6 months plan  RTC in 3 months  Medication Adjustments/Labs and Tests Ordered: Current medicines are reviewed at length with the patient today.  Concerns regarding medicines are outlined above.  No orders of the defined types were placed in this encounter.  No orders of the defined types were placed in this encounter.   There are no Patient Instructions on file for this visit.   Signed, [name], MD  11/10/2020 12:55 PM    [facility]

## 2020-11-10 NOTE — Progress Notes (Signed)
Cardiology Office Note:    Date:  11/11/2020   ID:  Joseph Ayala, DOB [birth year], MRN [id number]  PCP:  [name], MD  Cardiologist:  [name], MD  Electrophysiologist:  None   Referring MD: [name], PA-C   Chief Complaint  Patient presents with  . Atrial Fibrillation    History of Present Illness:    Joseph Ayala is a 55 y.o. male with a hx of hypertension, necrotizing myopathy, opioid use disorder on Suboxone, alcohol abuse,  He was admitted on 08/27/2020 with shortness of breath.  Found to be hypoxic, chest x-ray showed right lower lobe pneumonia.  CTPA showed no PE but dense area of consolidation and groundglass opacity in right middle and lower lobes.  Developed worsening hypoxia requiring intubation on 2/20.  Was found to have septic shock requiring pressor support.  Respiratory cultures grew MRSA.  Hospital course was complicated by atrial fibrillation, which is a new diagnosis.  Echocardiogram on 2/22 showed EF 35 to 40%, mild RV dysfunction.  He was started on amiodarone and converted to normal sinus rhythm on 2/22.  He required chest tube placement for right pleural effusion.  He was admitted from 2/19 through 09/29/2020.  He required tracheostomy but was removed on 3/22 he was discharged on oral linezolid and MRSA pneumonia.  He was discharged on metoprolol, Lasix, and lisinopril.  TEE on 09/16/2020 showed no evidence of endocarditis.  Likely small PFO.  EF 45 to 50%, low normal RV function.  Also noted on CT to have 4.6 cm thoracic aortic aneurysm.  Today, he is accompanied by his wife, who also provides some history. Overall, he is doing well today. Since his hospital stay he has developed peripheral neuropathy. He will regularly wake up at 3-4 in the morning with terrible pain in his hands. Rarely, he also wakes up needing to "gasp for air" at night. He describes this as feeling like he does not have enough oxygen, but does not consider this to be  shortness of breath. He denies any chest pains. Twice a week he has physical therapists visit his home. He states that he is usually on his feet at least 8-10 hours every day. However, if he over-exerts too much, he is fatigued. He will feel lightheaded while bending over or lifting heavy objects, but denies any syncope. He noticed some LE edema yesterday, and remains compliant with his Lasix taken in the morning. While in the hospital, he lost 30 pounds. Lately, his weight is stable and he has been gaining back some appropriate weight. He has been doing well avoiding salt in his diet. He denies any hematuria or other bleeding issues. He reports stopping alcohol, nicotine, and marijuana use. His brother recently had heart failure and will receive a defibrillator.  Past Medical History:  Diagnosis Date  . Acute hypoxemic respiratory failure (HCC)   . Acute respiratory failure with hypoxia (HCC) 08/28/2020  . CAP (community acquired pneumonia) 08/28/2020  . Degenerative disc disease, lumbar   . Dental crown present   . ETOH abuse   . History of kidney stones   . Hypertension    states under control with meds., has been on med. x 1 yr. - is currently out of med., to see PCP 02/06/2018  . Marijuana use, continuous   . MRSA pneumonia (HCC) 09/09/2020  . Muscle atrophy 01/2018  . Muscle weakness 01/2018  . Necrotizing myopathy   . Necrotizing pneumonia (HCC) 09/09/2020  . Opioid  use disorder   . Pleural effusion   . Sepsis (HCC) 08/28/2020  . Thoracic aortic aneurysm (TAA) (HCC)    a. seen on CT 08/2020.    Past Surgical History:  Procedure Laterality Date  . ENDOVENOUS ABLATION SAPHENOUS VEIN W/ LASER Right 12-29-2015   endovenous laser ablation right greater saphenous vein, stab phlebectomy > 20 incisions right leg, sclerotherapy right leg by Gretta Beganodd Early MD    . MINOR MUSCLE BIOPSY Right 02/11/2018   Procedure: RECTUS FEMORIS MUSCLE BIOPSY;  Surgeon: Darnell LevelGerkin, Todd, MD;  Location: [facility];  Service: General;  Laterality: Right;  . MUSCLE BIOPSY Right 02/11/2018   Procedure: DELTOID MUSCLE BIOPSY;  Surgeon: Darnell LevelGerkin, Todd, MD;  Location: [facility];  Service: General;  Laterality: Right;  . NASAL FRACTURE SURGERY    . TRACHEOSTOMY TUBE PLACEMENT N/A 09/14/2020   Procedure: TRACHEOSTOMY;  Surgeon: Suzanna ObeyByers, John, MD;  Location: [facility];  Service: ENT;  Laterality: N/A;    Current Medications: Current Meds  Medication Sig  . apixaban (ELIQUIS) 5 MG TABS tablet TAKE 1 TABLET BY MOUTH TWICE DAILY.  . Buprenorphine HCl-Naloxone HCl 8-2 MG FILM Place 1 Film under the tongue in the morning and at bedtime.  . gabapentin (NEURONTIN) 300 MG capsule Take 3 capsules (900 mg total) by mouth 3 (three) times daily.  . metoprolol tartrate (LOPRESSOR) 25 MG tablet Take 1/2 tablet by mouth 2 times daily.  . QUEtiapine (SEROQUEL) 50 MG tablet Take 1 tablet (50 mg total) by mouth 2 (two) times daily.  . sildenafil (VIAGRA) 25 MG tablet Take 1 tablet (25 mg total) by mouth as needed for erectile dysfunction.  Marland Kitchen. testosterone cypionate (DEPOTESTOSTERONE CYPIONATE) 200 MG/ML injection INJECT 2 MLS INTO THE MUSCLE EVERY 2 WEEKS  . [DISCONTINUED] furosemide (LASIX) 40 MG tablet Take 1 tablet (40 mg total) by mouth daily.     Allergies:   Vicodin [hydrocodone-acetaminophen]   Social History   Socioeconomic History  . Marital status: Married    Spouse name: Not on file  . Number of children: 1  . Years of education: 5113  . Highest education level: Some college, no degree  Occupational History  . Occupation: not working  Tobacco Use  . Smoking status: Never Smoker  . Smokeless tobacco: Former Clinical biochemistUser  Vaping Use  . Vaping Use: Never used  Substance and Sexual Activity  . Alcohol use: Yes    Comment: occasionally  . Drug use: No  . Sexual activity: Yes    Partners: Female  Other Topics Concern  . Not on file  Social History Narrative   Lives with wife and daughter in a one  story home.  Has one child.  Currently not working.  Education: some college.    Social Determinants of Health   Financial Resource Strain: Not on file  Food Insecurity: Not on file  Transportation Needs: Not on file  Physical Activity: Not on file  Stress: Not on file  Social Connections: Not on file     Family History: The patient's family history includes Drug abuse in his brother; Varicose Veins in his brother.  ROS:   Please see the history of present illness.    (+) Peripheral neuropathy (+) Bilateral hand pain, severe (+) Lightheadedness (+) LE edema (+) Appropriate weight gain All other systems reviewed and are negative.  EKGs/Labs/Other Studies Reviewed:    The following studies were reviewed today:   EKG:   11/10/2020: NSR. Rate 89 bpm. LVH.  Echo 08/30/20: 1. Left ventricular ejection fraction, by estimation, is 35 to 40%. The  left ventricle has moderately decreased function. The left ventricle  demonstrates global hypokinesis. The left ventricular internal cavity size  was moderately dilated. Left  ventricular diastolic parameters are indeterminate.  2. Right ventricular systolic function is mildly reduced. The right  ventricular size is moderately enlarged. There is mildly elevated  pulmonary artery systolic pressure.  3. Left atrial size was mildly dilated.  4. Right atrial size was moderately dilated.  5. The mitral valve is normal in structure. Mild mitral valve  regurgitation. No evidence of mitral stenosis.  6. The aortic valve has an indeterminant number of cusps. There is mild  calcification of the aortic valve. Aortic valve regurgitation is not  visualized. Mild aortic valve sclerosis is present, with no evidence of  aortic valve stenosis.  7. Aortic dilatation noted. There is mild to moderate dilatation of the  ascending aorta, measuring 44 mm.  8. The inferior vena cava is dilated in size with <50% respiratory  variability, suggesting  right atrial pressure of 15 mmHg.   Lower Venous DVT Study 08/30/20: RIGHT:  - There is no evidence of deep vein thrombosis in the lower extremity.    - No cystic structure found in the popliteal fossa.  - Triphasic waveforms are noted in the posterior tibial, peroneal, and  anterior tibial arteries.    LEFT:  - There is no evidence of deep vein thrombosis in the lower extremity.    - No cystic structure found in the popliteal fossa.  - Triphasic waveforms are noted in the posterior tibial, peroneal, and  anterior tibial arteries.   TEE 09/16/20: 1. Left ventricular ejection fraction, by estimation, is 45 to 50%. The  left ventricle has mildly decreased function.  2. Right ventricular systolic function is low normal. The right  ventricular size is normal.  3. No left atrial/left atrial appendage thrombus was detected.  4. The mitral valve is normal in structure. Trivial mitral valve  regurgitation. No evidence of mitral stenosis.  5. The aortic valve is tricuspid. Aortic valve regurgitation is not  visualized. No aortic stenosis is present.  6. Evidence of atrial level shunting detected by color flow Doppler.  There is a small patent foramen ovale with predominantly left to right  shunting across the atrial septum.  LOWER EXTREMITY DOPPLER STUDY 10/12/20: Right: Resting right ankle-brachial index indicates noncompressible right  lower extremity arteries.   Waveforms are strong triphasic.  The right toe-brachial index is normal.   Left: Resting left ankle-brachial index indicates noncompressible left  lower extremity arteries.   Waveforms are strong triphasic.  The left toe-brachial index is normal.  Recent Labs: 09/02/2020: TSH 3.379 11/10/2020: ALT 48; BNP WILL FOLLOW; BUN 14; Creatinine, Ser 0.61; Hemoglobin 12.5; Magnesium 2.1; Platelets 261; Potassium 5.1; Sodium 141  Recent Lipid Panel    Component Value Date/Time   CHOL 202 (H) 09/01/2019 1053   TRIG 184 (H)  09/14/2020 0630   HDL 61 09/01/2019 1053   CHOLHDL 3.3 09/01/2019 1053   LDLCALC 111 (H) 09/01/2019 1053    Physical Exam:    VS:  BP 118/68   Pulse 89   Ht 5\' 11"  (1.803 m)   Wt 200 lb 6.4 oz (90.9 kg)   SpO2 97%   BMI 27.95 kg/m     Wt Readings from Last 3 Encounters:  11/10/20 200 lb 6.4 oz (90.9 kg)  10/31/20 190 lb 12.8 oz (86.5 kg)  10/27/20 192 lb (87.1 kg)     GEN: Well nourished, well developed in no acute distress HEENT: Normal NECK: No JVD; No carotid bruits LYMPHATICS: No lymphadenopathy CARDIAC: RRR, no murmurs, rubs, gallops RESPIRATORY:  Clear to auscultation without rales, wheezing or rhonchi  ABDOMEN: Soft, non-tender, non-distended MUSCULOSKELETAL:  No edema; No deformity  SKIN: Warm and dry NEUROLOGIC:  Alert and oriented x 3 PSYCHIATRIC:  Normal affect   ASSESSMENT:    1. Acute combined systolic (congestive) and diastolic (congestive) heart failure (HCC)   2. Atrial fibrillation, unspecified type (HCC)   3. Thoracic aortic aneurysm without rupture (HCC)    PLAN:    Atrial fibrillation: Occurred in setting of acute illness.  Started on Eliquis 5 mg twice daily -Suspect A. fib was due to critical illness and will likely not need long-term anticoagulation.  Will check Zio patch x2 weeks to evaluate for further A. fib  Acute combined systolic and diastolic heart failure: EF 35 to 40% 2/20.  TEE 3/11 showed improvement in EF to 45 to 50%.  On metoprolol 12.5 mg twice daily, Lasix 40 mg daily -Check echocardiogram to evaluate for recovery of systolic function with resolution of critical illness -Appears euvolemic.  Can change Lasix to 40 mg as needed.  Advised to monitor daily weights and take Lasix if gains more than 3 pounds in 1 day or 5 pounds in 1 week  Thoracic aortic aneurysm: Measured 4.6 cm.  Follows with thoracic surgery, repeat CT chest in 6 months plan  RTC in 3 months  Medication Adjustments/Labs and Tests Ordered: Current medicines  are reviewed at length with the patient today.  Concerns regarding medicines are outlined above.  Orders Placed This Encounter  Procedures  . Comprehensive metabolic panel  . CBC  . Magnesium  . Brain natriuretic peptide  . LONG TERM MONITOR (3-14 DAYS)  . EKG 12-Lead  . ECHOCARDIOGRAM COMPLETE   Meds ordered this encounter  Medications  . furosemide (LASIX) 40 MG tablet    Sig: Take 1 tablet (40 mg total) by mouth as needed (for weight increase of 3 lbs overnight or 5 lbs in 1 week).    Dispense:  30 tablet    Refill:  3    Patient Instructions  Medication Instructions:  Change furosemide (Lasix) to 40 mg AS NEEDED for weight increase of 3 lbs overnight or 5 lbs in 1 week  *If you need a refill on your cardiac medications before your next appointment, please call your pharmacy*   Lab Work: CMET, BNP, Mag, CBC  If you have labs (blood work) drawn today and your tests are completely normal, you will receive your results only by: Marland Kitchen MyChart Message (if you have MyChart) OR . A paper copy in the mail If you have any lab test that is abnormal or we need to change your treatment, we will call you to review the results.   Testing/Procedures: Your physician has requested that you have an echocardiogram. Echocardiography is a painless test that uses sound waves to create images of your heart. It provides your doctor with information about the size and shape of your heart and how well your heart's chambers and valves are working. This procedure takes approximately one hour. There are no restrictions for this procedure.  This will be done at our [facility] location:  [address]  ZIO XT- Long Term Monitor Instructions   Your physician has requested you wear a ZIO patch monitor for  __14_ days.  This is a single patch monitor.   [employer] supplies one patch monitor per enrollment. Additional stickers are not available. Please do not apply patch if you will be having  a Nuclear Stress Test, Echocardiogram, Cardiac CT, MRI, or Chest Xray during the period you would be wearing the monitor. The patch cannot be worn during these tests. You cannot remove and re-apply the ZIO XT patch monitor.  Your ZIO patch monitor will be sent Fed Ex from [employer] directly to your home address. It may take 3-5 days to receive your monitor after you have been enrolled.  Once you have received your monitor, please review the enclosed instructions. Your monitor has already been registered assigning a specific monitor serial # to you.  Billing and Patient Assistance Program Information   We have supplied [employer] with any of your insurance information on file for billing purposes. [employer] offers a sliding scale Patient Assistance Program for patients that do not have insurance, or whose insurance does not completely cover the cost of the ZIO monitor.   You must apply for the Patient Assistance Program to qualify for this discounted rate.     To apply, please call [employer] at [phone], select option 4, then select option 2, and ask to apply for Patient Assistance Program.  Meredeth Ide will ask your household income, and how many people are in your household.  They will quote your out-of-pocket cost based on that information.  [employer] will also be able to set up a 77-month, interest-free payment plan if needed.  Applying the monitor   Shave hair from upper left chest.  Hold abrader disc by orange tab. Rub abrader in 40 strokes over the upper left chest as indicated in your monitor instructions.  Clean area with 4 enclosed alcohol pads. Let dry.  Apply patch as indicated in monitor instructions. Patch will be placed under collarbone on left side of chest with arrow pointing upward.  Rub patch adhesive wings for 2 minutes. Remove white label marked "1". Remove the white label marked "2". Rub patch adhesive wings for 2 additional minutes.  While looking in a mirror, press and  release button in center of patch. A small green light will flash 3-4 times. This will be your only indicator that the monitor has been turned on. ?  Do not shower for the first 24 hours. You may shower after the first 24 hours.  Press the button if you feel a symptom. You will hear a small click. Record Date, Time and Symptom in the Patient Logbook.  When you are ready to remove the patch, follow instructions on the last 2 pages of the Patient Logbook. Stick patch monitor onto the last page of Patient Logbook.  Place Patient Logbook in the blue and white box.  Use locking tab on box and tape box closed securely.  The blue and white box has prepaid postage on it. Please place it in the mailbox as soon as possible. Your physician should have your test results approximately 7 days after the monitor has been mailed back to [facility]  Call [facility] Customer Care at [phone] if you have questions regarding your ZIO XT patch monitor. Call them immediately if you see an orange light blinking on your monitor.  If your monitor falls off in less than 4 days, contact our Monitor department at [phone]. ?If your monitor becomes loose or falls off after 4 days call [employer] at [phone] for suggestions on securing your monitor.?  Follow-Up: At [facility], you and your health needs are our priority.  As part of our continuing mission to provide you with exceptional heart care, we have created designated Provider Care Teams.  These Care Teams include your primary Cardiologist (physician) and Advanced Practice Providers (APPs -  Physician Assistants and Nurse Practitioners) who all work together to provide you with the care you need, when you need it.  We recommend signing up for the patient portal called "MyChart".  Sign up information is provided on this After Visit Summary.  MyChart is used to connect with patients for Virtual Visits (Telemedicine).  Patients are able to view lab/test  results, encounter notes, upcoming appointments, etc.  Non-urgent messages can be sent to your provider as well.   To learn more about what you can do with MyChart, go to [email or web address].    Your next appointment:   3 month(s)  The format for your next appointment:   In Person  Provider:   Epifanio Lesches, MD         [facility] Stumpf,acting as a scribe for [name], MD.,have documented all relevant documentation on the behalf of [name], MD,as directed by  [name], MD while in the presence of [name], MD.  I, [name], MD, have reviewed all documentation for this visit. The documentation on 11/11/20 for the exam, diagnosis, procedures, and orders are all accurate and complete.   Signed, [name], MD  11/11/2020 12:04 PM    [facility]

## 2020-11-10 NOTE — Patient Instructions (Signed)
Medication Instructions:  Change furosemide (Lasix) to 40 mg AS NEEDED for weight increase of 3 lbs overnight or 5 lbs in 1 week  *If you need a refill on your cardiac medications before your next appointment, please call your pharmacy*   Lab Work: CMET, BNP, Mag, CBC  If you have labs (blood work) drawn today and your tests are completely normal, you will receive your results only by: Marland Kitchen MyChart Message (if you have MyChart) OR . A paper copy in the mail If you have any lab test that is abnormal or we need to change your treatment, we will call you to review the results.   Testing/Procedures: Your physician has requested that you have an echocardiogram. Echocardiography is a painless test that uses sound waves to create images of your heart. It provides your doctor with information about the size and shape of your heart and how well your heart's chambers and valves are working. This procedure takes approximately one hour. There are no restrictions for this procedure.  This will be done at our [facility] location:  [address]  ZIO XT- Long Term Monitor Instructions   Your physician has requested you wear a ZIO patch monitor for __14_ days.  This is a single patch monitor.   [employer] supplies one patch monitor per enrollment. Additional stickers are not available. Please do not apply patch if you will be having a Nuclear Stress Test, Echocardiogram, Cardiac CT, MRI, or Chest Xray during the period you would be wearing the monitor. The patch cannot be worn during these tests. You cannot remove and re-apply the ZIO XT patch monitor.  Your ZIO patch monitor will be sent Fed Ex from [employer] directly to your home address. It may take 3-5 days to receive your monitor after you have been enrolled.  Once you have received your monitor, please review the enclosed instructions. Your monitor has already been registered assigning a specific monitor serial # to  you.  Billing and Patient Assistance Program Information   We have supplied [employer] with any of your insurance information on file for billing purposes. [employer] offers a sliding scale Patient Assistance Program for patients that do not have insurance, or whose insurance does not completely cover the cost of the ZIO monitor.   You must apply for the Patient Assistance Program to qualify for this discounted rate.     To apply, please call [employer] at [phone], select option 4, then select option 2, and ask to apply for Patient Assistance Program.  Meredeth Ide will ask your household income, and how many people are in your household.  They will quote your out-of-pocket cost based on that information.  [employer] will also be able to set up a 45-month, interest-free payment plan if needed.  Applying the monitor   Shave hair from upper left chest.  Hold abrader disc by orange tab. Rub abrader in 40 strokes over the upper left chest as indicated in your monitor instructions.  Clean area with 4 enclosed alcohol pads. Let dry.  Apply patch as indicated in monitor instructions. Patch will be placed under collarbone on left side of chest with arrow pointing upward.  Rub patch adhesive wings for 2 minutes. Remove white label marked "1". Remove the white label marked "2". Rub patch adhesive wings for 2 additional minutes.  While looking in a mirror, press and release button in center of patch. A small green light will flash 3-4 times. This will be your only  indicator that the monitor has been turned on. ?  Do not shower for the first 24 hours. You may shower after the first 24 hours.  Press the button if you feel a symptom. You will hear a small click. Record Date, Time and Symptom in the Patient Logbook.  When you are ready to remove the patch, follow instructions on the last 2 pages of the Patient Logbook. Stick patch monitor onto the last page of Patient Logbook.  Place Patient Logbook in the blue and white  box.  Use locking tab on box and tape box closed securely.  The blue and white box has prepaid postage on it. Please place it in the mailbox as soon as possible. Your physician should have your test results approximately 7 days after the monitor has been mailed back to [facility].  Call [facility] Customer Care at [phone] if you have questions regarding your ZIO XT patch monitor. Call them immediately if you see an orange light blinking on your monitor.  If your monitor falls off in less than 4 days, contact our Monitor department at [phone]. ?If your monitor becomes loose or falls off after 4 days call [employer] at [phone] for suggestions on securing your monitor.?  Follow-Up: At [facility], you and your health needs are our priority.  As part of our continuing mission to provide you with exceptional heart care, we have created designated Provider Care Teams.  These Care Teams include your primary Cardiologist (physician) and Advanced Practice Providers (APPs -  Physician Assistants and Nurse Practitioners) who all work together to provide you with the care you need, when you need it.  We recommend signing up for the patient portal called "MyChart".  Sign up information is provided on this After Visit Summary.  MyChart is used to connect with patients for Virtual Visits (Telemedicine).  Patients are able to view lab/test results, encounter notes, upcoming appointments, etc.  Non-urgent messages can be sent to your provider as well.   To learn more about what you can do with MyChart, go to [email or web address].    Your next appointment:   3 month(s)  The format for your next appointment:   In Person  Provider:   Epifanio Lesches, MD

## 2020-11-11 LAB — COMPREHENSIVE METABOLIC PANEL
ALT: 48 IU/L — ABNORMAL HIGH (ref 0–44)
AST: 42 IU/L — ABNORMAL HIGH (ref 0–40)
Albumin/Globulin Ratio: 2 (ref 1.2–2.2)
Albumin: 4.5 g/dL (ref 3.8–4.9)
Alkaline Phosphatase: 73 IU/L (ref 44–121)
BUN/Creatinine Ratio: 23 — ABNORMAL HIGH (ref 9–20)
BUN: 14 mg/dL (ref 6–24)
Bilirubin Total: 0.2 mg/dL (ref 0.0–1.2)
CO2: 22 mmol/L (ref 20–29)
Calcium: 10.2 mg/dL (ref 8.7–10.2)
Chloride: 99 mmol/L (ref 96–106)
Creatinine, Ser: 0.61 mg/dL — ABNORMAL LOW (ref 0.76–1.27)
Globulin, Total: 2.2 g/dL (ref 1.5–4.5)
Glucose: 103 mg/dL — ABNORMAL HIGH (ref 65–99)
Potassium: 5.1 mmol/L (ref 3.5–5.2)
Sodium: 141 mmol/L (ref 134–144)
Total Protein: 6.7 g/dL (ref 6.0–8.5)
eGFR: 115 mL/min/{1.73_m2} (ref >59)

## 2020-11-11 LAB — CBC
Hematocrit: 38.2 % (ref 37.5–51.0)
Hemoglobin: 12.5 g/dL — ABNORMAL LOW (ref 13.0–17.7)
MCH: 29.6 pg (ref 26.6–33.0)
MCHC: 32.7 g/dL (ref 31.5–35.7)
MCV: 91 fL (ref 79–97)
Platelets: 261 10*3/uL (ref 150–450)
RBC: 4.22 x10E6/uL (ref 4.14–5.80)
RDW: 14.2 % (ref 11.6–15.4)
WBC: 7 10*3/uL (ref 3.4–10.8)

## 2020-11-11 LAB — BRAIN NATRIURETIC PEPTIDE: BNP: 28.8 pg/mL (ref 0.0–100.0)

## 2020-11-11 LAB — MAGNESIUM: Magnesium: 2.1 mg/dL (ref 1.6–2.3)

## 2020-11-14 ENCOUNTER — Encounter: Payer: Medicare HMO | Attending: Physical Medicine & Rehabilitation | Admitting: Physical Medicine & Rehabilitation

## 2020-11-14 DIAGNOSIS — M609 Myositis, unspecified: Secondary | ICD-10-CM | POA: Diagnosis not present

## 2020-11-14 DIAGNOSIS — G629 Polyneuropathy, unspecified: Secondary | ICD-10-CM | POA: Diagnosis not present

## 2020-11-14 DIAGNOSIS — J850 Gangrene and necrosis of lung: Secondary | ICD-10-CM | POA: Diagnosis not present

## 2020-11-14 DIAGNOSIS — G8929 Other chronic pain: Secondary | ICD-10-CM | POA: Diagnosis not present

## 2020-11-14 DIAGNOSIS — M47816 Spondylosis without myelopathy or radiculopathy, lumbar region: Secondary | ICD-10-CM | POA: Diagnosis not present

## 2020-11-14 DIAGNOSIS — B9562 Methicillin resistant Staphylococcus aureus infection as the cause of diseases classified elsewhere: Secondary | ICD-10-CM | POA: Diagnosis not present

## 2020-11-14 DIAGNOSIS — M5136 Other intervertebral disc degeneration, lumbar region: Secondary | ICD-10-CM | POA: Diagnosis not present

## 2020-11-14 DIAGNOSIS — R1310 Dysphagia, unspecified: Secondary | ICD-10-CM | POA: Diagnosis not present

## 2020-11-14 DIAGNOSIS — G9340 Encephalopathy, unspecified: Secondary | ICD-10-CM | POA: Diagnosis not present

## 2020-11-16 ENCOUNTER — Other Ambulatory Visit: Payer: Self-pay | Admitting: *Deleted

## 2020-11-16 MED ORDER — QUETIAPINE FUMARATE 50 MG PO TABS: 50.0000 mg | ORAL_TABLET | Freq: Two times a day (BID) | ORAL | 0 refills | Status: DC

## 2020-11-16 MED ORDER — ELIQUIS 5 MG PO TABS: 5.0000 mg | ORAL_TABLET | Freq: Two times a day (BID) | ORAL | 0 refills | Status: DC

## 2020-11-17 ENCOUNTER — Other Ambulatory Visit: Payer: Self-pay | Admitting: Internal Medicine

## 2020-11-17 ENCOUNTER — Telehealth: Payer: Self-pay | Admitting: Cardiology

## 2020-11-17 ENCOUNTER — Ambulatory Visit (INDEPENDENT_AMBULATORY_CARE_PROVIDER_SITE_OTHER): Payer: Medicare HMO

## 2020-11-17 ENCOUNTER — Other Ambulatory Visit (HOSPITAL_COMMUNITY): Payer: Self-pay

## 2020-11-17 MED ORDER — METOPROLOL TARTRATE 25 MG PO TABS
12.5000 mg | ORAL_TABLET | Freq: Two times a day (BID) | ORAL | 0 refills | Status: DC
  Filled 2020-11-17: qty 60, 60d supply, fill #0

## 2020-11-17 NOTE — Progress Notes (Unsigned)
Patient enrolled for [employer] to ship a 14 day long term holter monitor to his home.  Additional strips requested.  11/10/20 ZIO XT cx'd.  It fell off within 24 hours due to excessive perspiration.

## 2020-11-17 NOTE — Telephone Encounter (Signed)
Returned call to patient who states that he has been having issues keeping his heart monitor on. Patient states that he  Sweats and has not been able to keep the monitor on even with tape and shaving his chest prior to applying. Patient states that he only wore it for 24 hours. Patient would like to know if there is another option for a heart monitor.   Advised patient to contact customer service with [employer] in regard to any recommendations for getting the monitor to stay. Advised patient I would forward message to Dr. Bjorn Pippin for advice as well. Patient verbalized understanding.    Patient states best time to contact him back would be after 12pm today.

## 2020-11-17 NOTE — Telephone Encounter (Signed)
Discussed with [employer] representative.  Even if we sent a replacement monitor , chances are the new monitor would fall off as well because patient sweats.  They will cancel the ZIO XT enrollment and charges.  Patient instructed to mail monitor back to [facility]. Patient will be enrolled for [employer] to ship a 14 day long term monitor to his home.  Instructions will be included in kit. Explained to patient this monitor will come with replacement strips and I will request additional strips be included.  This monitor will be completely waterproof, so he may shower, bathe , or swim with monitor on.  Hopefully, this alternative monitor will adhere better during times of perspiration.

## 2020-11-17 NOTE — Telephone Encounter (Signed)
Patient called in with his wife on the phone. He states yesterday around 2:00 PM his heart monitor became detached from his body. He states it has been beeping ever since. Patient's wife spoke with me as well. She states this type of monitor will not work for the patient at all because of how much he sweats. She states she works at the hospital and they tried a few different types of surgical tape and the monitor still would not stay on his body. She states the patient will likely need a different type of monitor. Please return call to discuss further.

## 2020-11-18 DIAGNOSIS — M609 Myositis, unspecified: Secondary | ICD-10-CM | POA: Diagnosis not present

## 2020-11-18 DIAGNOSIS — M47816 Spondylosis without myelopathy or radiculopathy, lumbar region: Secondary | ICD-10-CM | POA: Diagnosis not present

## 2020-11-18 DIAGNOSIS — G629 Polyneuropathy, unspecified: Secondary | ICD-10-CM | POA: Diagnosis not present

## 2020-11-18 DIAGNOSIS — M5136 Other intervertebral disc degeneration, lumbar region: Secondary | ICD-10-CM | POA: Diagnosis not present

## 2020-11-18 DIAGNOSIS — R1310 Dysphagia, unspecified: Secondary | ICD-10-CM | POA: Diagnosis not present

## 2020-11-18 DIAGNOSIS — G8929 Other chronic pain: Secondary | ICD-10-CM | POA: Diagnosis not present

## 2020-11-18 DIAGNOSIS — G9340 Encephalopathy, unspecified: Secondary | ICD-10-CM | POA: Diagnosis not present

## 2020-11-18 DIAGNOSIS — J850 Gangrene and necrosis of lung: Secondary | ICD-10-CM | POA: Diagnosis not present

## 2020-11-18 DIAGNOSIS — B9562 Methicillin resistant Staphylococcus aureus infection as the cause of diseases classified elsewhere: Secondary | ICD-10-CM | POA: Diagnosis not present

## 2020-11-21 ENCOUNTER — Telehealth: Payer: Self-pay | Admitting: *Deleted

## 2020-11-21 ENCOUNTER — Ambulatory Visit: Payer: Medicare HMO | Admitting: *Deleted

## 2020-11-21 DIAGNOSIS — I712 Thoracic aortic aneurysm, without rupture, unspecified: Secondary | ICD-10-CM

## 2020-11-21 DIAGNOSIS — M792 Neuralgia and neuritis, unspecified: Secondary | ICD-10-CM

## 2020-11-21 DIAGNOSIS — I480 Paroxysmal atrial fibrillation: Secondary | ICD-10-CM

## 2020-11-21 DIAGNOSIS — F1190 Opioid use, unspecified, uncomplicated: Secondary | ICD-10-CM

## 2020-11-21 DIAGNOSIS — N529 Male erectile dysfunction, unspecified: Secondary | ICD-10-CM

## 2020-11-21 DIAGNOSIS — I5021 Acute systolic (congestive) heart failure: Secondary | ICD-10-CM

## 2020-11-21 NOTE — Chronic Care Management (AMB) (Addendum)
  Care Management   Note  11/21/2020 Name: Joseph Ayala MRN: [id number] DOB: 24-Feb-1966  Joseph Ayala is enrolled in a Managed Medicaid plan: No. Outreach attempt today was successful.   Reached patient to discuss CCM program and role of CCM RN.  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 He says he is still receiving home physical therapy twice weekly from [facility].  He says his reflexes are slowed so he is choosing not to drive, otherwise he can provide self care as long as he paces himself as he tires easily.  Encouraged patient to call customer service on his [facility] Medicare card to determine if he has medical transportation benefit.  He states his wife is a Engineer, civil (consulting) with [employer] in surgical short stay and just returned to work today after a long FMLA to care for him. He says she attends his medical appointments with him and kept a diary of his hospitalization.   Notified Caron Presume, [facility] Care Office manager and she will mail home BP monitor to patient's home address.   With patient's agreement , will not initiate CCM services at this time. Will mail him [facility] Care Management spiral bound calendar (that includes health information on atrial fibrillation and heart failure and HTN) with this CCM RN's business card . Will plan to reach out to patient again in 4-6 weeks to discuss reconsideration of CCM program enrollment at that time.   Cranford Mon RN, CCM, CDCES CCM Clinic RN Care Manager [phone]

## 2020-11-21 NOTE — Telephone Encounter (Signed)
Patient received only monitor strips from [employer] but did not receive monitor. Informed patient , I requested additional strips to be shipped to his home since he had problem with previous monitor falling off during perspiration. Contacted [employer] representative who confirmed Holter monitor being release from [employer] shipping today.  Informed patient it may be a couple of days before he receives it.  Patient thankful for the follow up.

## 2020-11-22 DIAGNOSIS — G8929 Other chronic pain: Secondary | ICD-10-CM | POA: Diagnosis not present

## 2020-11-22 DIAGNOSIS — M47816 Spondylosis without myelopathy or radiculopathy, lumbar region: Secondary | ICD-10-CM | POA: Diagnosis not present

## 2020-11-22 DIAGNOSIS — M5136 Other intervertebral disc degeneration, lumbar region: Secondary | ICD-10-CM | POA: Diagnosis not present

## 2020-11-22 DIAGNOSIS — M609 Myositis, unspecified: Secondary | ICD-10-CM | POA: Diagnosis not present

## 2020-11-22 DIAGNOSIS — J850 Gangrene and necrosis of lung: Secondary | ICD-10-CM | POA: Diagnosis not present

## 2020-11-22 DIAGNOSIS — B9562 Methicillin resistant Staphylococcus aureus infection as the cause of diseases classified elsewhere: Secondary | ICD-10-CM | POA: Diagnosis not present

## 2020-11-22 DIAGNOSIS — G629 Polyneuropathy, unspecified: Secondary | ICD-10-CM | POA: Diagnosis not present

## 2020-11-22 DIAGNOSIS — G9340 Encephalopathy, unspecified: Secondary | ICD-10-CM | POA: Diagnosis not present

## 2020-11-22 DIAGNOSIS — R1310 Dysphagia, unspecified: Secondary | ICD-10-CM | POA: Diagnosis not present

## 2020-11-23 DIAGNOSIS — I4891 Unspecified atrial fibrillation: Secondary | ICD-10-CM

## 2020-11-28 ENCOUNTER — Other Ambulatory Visit: Payer: Self-pay | Admitting: Student in an Organized Health Care Education/Training Program

## 2020-11-28 ENCOUNTER — Other Ambulatory Visit (HOSPITAL_COMMUNITY): Payer: Self-pay

## 2020-11-29 ENCOUNTER — Encounter: Payer: Self-pay | Admitting: *Deleted

## 2020-11-29 ENCOUNTER — Other Ambulatory Visit (HOSPITAL_COMMUNITY): Payer: Self-pay

## 2020-11-29 MED ORDER — TESTOSTERONE CYPIONATE 200 MG/ML IM SOLN
400.0000 mg | INTRAMUSCULAR | 1 refills | Status: DC
  Filled 2020-11-29: qty 4, 28d supply, fill #0
  Filled 2020-12-27: qty 4, 28d supply, fill #1

## 2020-11-30 ENCOUNTER — Other Ambulatory Visit (HOSPITAL_COMMUNITY): Payer: Self-pay

## 2020-12-02 ENCOUNTER — Other Ambulatory Visit (HOSPITAL_COMMUNITY): Payer: Self-pay

## 2020-12-02 DIAGNOSIS — M5136 Other intervertebral disc degeneration, lumbar region: Secondary | ICD-10-CM | POA: Diagnosis not present

## 2020-12-02 DIAGNOSIS — J850 Gangrene and necrosis of lung: Secondary | ICD-10-CM | POA: Diagnosis not present

## 2020-12-02 DIAGNOSIS — G629 Polyneuropathy, unspecified: Secondary | ICD-10-CM | POA: Diagnosis not present

## 2020-12-02 DIAGNOSIS — G9340 Encephalopathy, unspecified: Secondary | ICD-10-CM | POA: Diagnosis not present

## 2020-12-02 DIAGNOSIS — G8929 Other chronic pain: Secondary | ICD-10-CM | POA: Diagnosis not present

## 2020-12-02 DIAGNOSIS — B9562 Methicillin resistant Staphylococcus aureus infection as the cause of diseases classified elsewhere: Secondary | ICD-10-CM | POA: Diagnosis not present

## 2020-12-02 DIAGNOSIS — I4891 Unspecified atrial fibrillation: Secondary | ICD-10-CM | POA: Diagnosis not present

## 2020-12-02 DIAGNOSIS — M609 Myositis, unspecified: Secondary | ICD-10-CM | POA: Diagnosis not present

## 2020-12-02 DIAGNOSIS — R1310 Dysphagia, unspecified: Secondary | ICD-10-CM | POA: Diagnosis not present

## 2020-12-02 DIAGNOSIS — M47816 Spondylosis without myelopathy or radiculopathy, lumbar region: Secondary | ICD-10-CM | POA: Diagnosis not present

## 2020-12-05 ENCOUNTER — Other Ambulatory Visit: Payer: Self-pay | Admitting: Student in an Organized Health Care Education/Training Program

## 2020-12-08 ENCOUNTER — Other Ambulatory Visit: Payer: Self-pay

## 2020-12-08 ENCOUNTER — Ambulatory Visit (HOSPITAL_COMMUNITY): Payer: Medicare HMO | Attending: Cardiology

## 2020-12-08 DIAGNOSIS — I5041 Acute combined systolic (congestive) and diastolic (congestive) heart failure: Secondary | ICD-10-CM | POA: Insufficient documentation

## 2020-12-08 LAB — ECHOCARDIOGRAM COMPLETE
Area-P 1/2: 3.65 cm2
MV M vel: 4.96 m/s
MV Peak grad: 98.4 mmHg
S' Lateral: 3.2 cm

## 2020-12-08 MED ORDER — PERFLUTREN LIPID MICROSPHERE
1.0000 mL | INTRAVENOUS | Status: AC | PRN
  Administered 2020-12-08: 2 mL via INTRAVENOUS

## 2020-12-09 ENCOUNTER — Other Ambulatory Visit (HOSPITAL_COMMUNITY): Payer: Self-pay

## 2020-12-09 NOTE — Telephone Encounter (Signed)
I am going to discontinue the apixaban at this point. Patient was anticoagulated for atrial fibrillation related to a critical illness in February. He is now about 3 months post-discharge, he completed an ambulatory monitor which showed only sinus rhythm, he had a repeat echo which shows normalized EF and minimal structural heart disease. I think the risk of recurrent AF in this person is low, so benefits of anticoagulation going forward seem low as well. I have messaged his cardiologist as well to see if he agrees.

## 2020-12-12 DIAGNOSIS — R1310 Dysphagia, unspecified: Secondary | ICD-10-CM | POA: Diagnosis not present

## 2020-12-12 DIAGNOSIS — G8929 Other chronic pain: Secondary | ICD-10-CM | POA: Diagnosis not present

## 2020-12-12 DIAGNOSIS — I480 Paroxysmal atrial fibrillation: Secondary | ICD-10-CM | POA: Diagnosis not present

## 2020-12-12 DIAGNOSIS — B9562 Methicillin resistant Staphylococcus aureus infection as the cause of diseases classified elsewhere: Secondary | ICD-10-CM | POA: Diagnosis not present

## 2020-12-12 DIAGNOSIS — M47816 Spondylosis without myelopathy or radiculopathy, lumbar region: Secondary | ICD-10-CM | POA: Diagnosis not present

## 2020-12-12 DIAGNOSIS — M609 Myositis, unspecified: Secondary | ICD-10-CM | POA: Diagnosis not present

## 2020-12-12 DIAGNOSIS — J850 Gangrene and necrosis of lung: Secondary | ICD-10-CM | POA: Diagnosis not present

## 2020-12-12 DIAGNOSIS — G629 Polyneuropathy, unspecified: Secondary | ICD-10-CM | POA: Diagnosis not present

## 2020-12-12 DIAGNOSIS — M5136 Other intervertebral disc degeneration, lumbar region: Secondary | ICD-10-CM | POA: Diagnosis not present

## 2020-12-15 DIAGNOSIS — G629 Polyneuropathy, unspecified: Secondary | ICD-10-CM | POA: Diagnosis not present

## 2020-12-15 DIAGNOSIS — R1310 Dysphagia, unspecified: Secondary | ICD-10-CM | POA: Diagnosis not present

## 2020-12-15 DIAGNOSIS — M47816 Spondylosis without myelopathy or radiculopathy, lumbar region: Secondary | ICD-10-CM | POA: Diagnosis not present

## 2020-12-15 DIAGNOSIS — M609 Myositis, unspecified: Secondary | ICD-10-CM | POA: Diagnosis not present

## 2020-12-15 DIAGNOSIS — J850 Gangrene and necrosis of lung: Secondary | ICD-10-CM | POA: Diagnosis not present

## 2020-12-15 DIAGNOSIS — M5136 Other intervertebral disc degeneration, lumbar region: Secondary | ICD-10-CM | POA: Diagnosis not present

## 2020-12-15 DIAGNOSIS — B9562 Methicillin resistant Staphylococcus aureus infection as the cause of diseases classified elsewhere: Secondary | ICD-10-CM | POA: Diagnosis not present

## 2020-12-15 DIAGNOSIS — I480 Paroxysmal atrial fibrillation: Secondary | ICD-10-CM | POA: Diagnosis not present

## 2020-12-15 DIAGNOSIS — G8929 Other chronic pain: Secondary | ICD-10-CM | POA: Diagnosis not present

## 2020-12-19 DIAGNOSIS — M5136 Other intervertebral disc degeneration, lumbar region: Secondary | ICD-10-CM | POA: Diagnosis not present

## 2020-12-19 DIAGNOSIS — R1310 Dysphagia, unspecified: Secondary | ICD-10-CM | POA: Diagnosis not present

## 2020-12-19 DIAGNOSIS — M609 Myositis, unspecified: Secondary | ICD-10-CM | POA: Diagnosis not present

## 2020-12-19 DIAGNOSIS — J850 Gangrene and necrosis of lung: Secondary | ICD-10-CM | POA: Diagnosis not present

## 2020-12-19 DIAGNOSIS — M47816 Spondylosis without myelopathy or radiculopathy, lumbar region: Secondary | ICD-10-CM | POA: Diagnosis not present

## 2020-12-19 DIAGNOSIS — B9562 Methicillin resistant Staphylococcus aureus infection as the cause of diseases classified elsewhere: Secondary | ICD-10-CM | POA: Diagnosis not present

## 2020-12-19 DIAGNOSIS — I480 Paroxysmal atrial fibrillation: Secondary | ICD-10-CM | POA: Diagnosis not present

## 2020-12-19 DIAGNOSIS — G629 Polyneuropathy, unspecified: Secondary | ICD-10-CM | POA: Diagnosis not present

## 2020-12-19 DIAGNOSIS — G8929 Other chronic pain: Secondary | ICD-10-CM | POA: Diagnosis not present

## 2020-12-22 DIAGNOSIS — B9562 Methicillin resistant Staphylococcus aureus infection as the cause of diseases classified elsewhere: Secondary | ICD-10-CM | POA: Diagnosis not present

## 2020-12-22 DIAGNOSIS — M5136 Other intervertebral disc degeneration, lumbar region: Secondary | ICD-10-CM | POA: Diagnosis not present

## 2020-12-22 DIAGNOSIS — J850 Gangrene and necrosis of lung: Secondary | ICD-10-CM | POA: Diagnosis not present

## 2020-12-22 DIAGNOSIS — M609 Myositis, unspecified: Secondary | ICD-10-CM | POA: Diagnosis not present

## 2020-12-22 DIAGNOSIS — I480 Paroxysmal atrial fibrillation: Secondary | ICD-10-CM | POA: Diagnosis not present

## 2020-12-22 DIAGNOSIS — R1310 Dysphagia, unspecified: Secondary | ICD-10-CM | POA: Diagnosis not present

## 2020-12-22 DIAGNOSIS — G8929 Other chronic pain: Secondary | ICD-10-CM | POA: Diagnosis not present

## 2020-12-22 DIAGNOSIS — M47816 Spondylosis without myelopathy or radiculopathy, lumbar region: Secondary | ICD-10-CM | POA: Diagnosis not present

## 2020-12-22 DIAGNOSIS — G629 Polyneuropathy, unspecified: Secondary | ICD-10-CM | POA: Diagnosis not present

## 2020-12-27 ENCOUNTER — Other Ambulatory Visit (HOSPITAL_COMMUNITY): Payer: Self-pay

## 2020-12-28 DIAGNOSIS — G629 Polyneuropathy, unspecified: Secondary | ICD-10-CM | POA: Diagnosis not present

## 2020-12-28 DIAGNOSIS — M609 Myositis, unspecified: Secondary | ICD-10-CM | POA: Diagnosis not present

## 2020-12-28 DIAGNOSIS — R1310 Dysphagia, unspecified: Secondary | ICD-10-CM | POA: Diagnosis not present

## 2020-12-28 DIAGNOSIS — M47816 Spondylosis without myelopathy or radiculopathy, lumbar region: Secondary | ICD-10-CM | POA: Diagnosis not present

## 2020-12-28 DIAGNOSIS — I480 Paroxysmal atrial fibrillation: Secondary | ICD-10-CM | POA: Diagnosis not present

## 2020-12-28 DIAGNOSIS — B9562 Methicillin resistant Staphylococcus aureus infection as the cause of diseases classified elsewhere: Secondary | ICD-10-CM | POA: Diagnosis not present

## 2020-12-28 DIAGNOSIS — G8929 Other chronic pain: Secondary | ICD-10-CM | POA: Diagnosis not present

## 2020-12-28 DIAGNOSIS — J850 Gangrene and necrosis of lung: Secondary | ICD-10-CM | POA: Diagnosis not present

## 2020-12-28 DIAGNOSIS — M5136 Other intervertebral disc degeneration, lumbar region: Secondary | ICD-10-CM | POA: Diagnosis not present

## 2021-01-03 ENCOUNTER — Other Ambulatory Visit: Payer: Self-pay | Admitting: Student in an Organized Health Care Education/Training Program

## 2021-01-03 ENCOUNTER — Other Ambulatory Visit (HOSPITAL_COMMUNITY): Payer: Self-pay

## 2021-01-04 ENCOUNTER — Other Ambulatory Visit (HOSPITAL_COMMUNITY): Payer: Self-pay

## 2021-01-04 MED ORDER — BUPRENORPHINE HCL-NALOXONE HCL 8-2 MG SL FILM
1.0000 | ORAL_FILM | Freq: Two times a day (BID) | SUBLINGUAL | 1 refills | Status: DC
  Filled 2021-01-04: qty 60, 30d supply, fill #0
  Filled 2021-02-10: qty 6, 3d supply, fill #1
  Filled 2021-02-10: qty 17, 9d supply, fill #1
  Filled 2021-02-13: qty 54, 27d supply, fill #2

## 2021-01-06 ENCOUNTER — Other Ambulatory Visit (HOSPITAL_COMMUNITY): Payer: Self-pay

## 2021-01-10 ENCOUNTER — Encounter: Payer: Self-pay | Admitting: *Deleted

## 2021-01-10 ENCOUNTER — Telehealth: Payer: Medicare HMO

## 2021-01-13 ENCOUNTER — Other Ambulatory Visit (HOSPITAL_COMMUNITY): Payer: Self-pay

## 2021-01-23 ENCOUNTER — Ambulatory Visit: Payer: Medicare HMO | Admitting: *Deleted

## 2021-01-23 DIAGNOSIS — I480 Paroxysmal atrial fibrillation: Secondary | ICD-10-CM

## 2021-01-23 DIAGNOSIS — I5021 Acute systolic (congestive) heart failure: Secondary | ICD-10-CM

## 2021-01-23 DIAGNOSIS — I712 Thoracic aortic aneurysm, without rupture, unspecified: Secondary | ICD-10-CM

## 2021-01-23 DIAGNOSIS — F1190 Opioid use, unspecified, uncomplicated: Secondary | ICD-10-CM

## 2021-01-23 NOTE — Chronic Care Management (AMB) (Signed)
  Care Management   Note  01/23/2021 Name: Joseph Ayala MRN: [id number] DOB: 1966/02/02  Joseph Ayala is enrolled in a Managed Medicaid plan: No. Outreach attempt today was successful.   Spoke with patient to again discuss the Chronic Care Management (CCM) Program and role of CCM RN and CCM BSW.  He states he is much better regarding  his recovery from a prolonged hospitalization in Feb and March. Says home health physical therapy services were stopped about a month ago and the neuropathy in his feet is 70% better. Says he does not require any assistive device when ambulating in the house. Says his wife who is a [employer] short stay RN assists him as needed.  At this time, he does not wish to participate in the CCM program. Encouraged him to contact his clinic provider should he change his mind in the future.   No further follow up required: patient is refusing CCM services.   Cranford Mon RN, CCM, CDCES CCM Clinic RN Care Manager [phone]

## 2021-01-24 ENCOUNTER — Other Ambulatory Visit: Payer: Self-pay | Admitting: Student in an Organized Health Care Education/Training Program

## 2021-01-24 ENCOUNTER — Other Ambulatory Visit (HOSPITAL_COMMUNITY): Payer: Self-pay

## 2021-01-25 ENCOUNTER — Other Ambulatory Visit (HOSPITAL_COMMUNITY): Payer: Self-pay

## 2021-01-25 MED ORDER — TESTOSTERONE CYPIONATE 200 MG/ML IM SOLN
400.0000 mg | INTRAMUSCULAR | 1 refills | Status: DC
  Filled 2021-01-25: qty 4, 28d supply, fill #0
  Filled 2021-02-21: qty 4, 28d supply, fill #1

## 2021-01-25 NOTE — Telephone Encounter (Signed)
Can we schedule him an appointment with me, ideally one week after injecting the testosterone so we can check a level.

## 2021-01-26 ENCOUNTER — Other Ambulatory Visit (HOSPITAL_COMMUNITY): Payer: Self-pay

## 2021-01-27 ENCOUNTER — Telehealth: Payer: Self-pay | Admitting: Cardiology

## 2021-01-27 ENCOUNTER — Other Ambulatory Visit: Payer: Self-pay | Admitting: Student in an Organized Health Care Education/Training Program

## 2021-01-27 ENCOUNTER — Other Ambulatory Visit (HOSPITAL_COMMUNITY): Payer: Self-pay

## 2021-01-27 MED ORDER — METOPROLOL TARTRATE 25 MG PO TABS
12.5000 mg | ORAL_TABLET | Freq: Two times a day (BID) | ORAL | 0 refills | Status: DC
  Filled 2021-01-27: qty 60, 60d supply, fill #0

## 2021-01-27 NOTE — Telephone Encounter (Signed)
furosemide (LASIX) 40 MG tablet  metoprolol tartrate (LOPRESSOR) 25 MG tablet  QUEtiapine (SEROQUEL) 50 MG tablet, REFILL REQUEST @ [facility].

## 2021-01-27 NOTE — Telephone Encounter (Signed)
*  STAT* If patient is at the pharmacy, call can be transferred to refill team.   1. Which medications need to be refilled? (please list name of each medication and dose if known)  furosemide (LASIX) 40 MG tablet Eliquis  2. Which pharmacy/location (including street and city if local pharmacy) is medication to be sent to? [facility]  3. Do they need a 30 day or 90 day supply?  Patient is requesting enough medication to last him until his appointment on 06/08/21.

## 2021-01-27 NOTE — Addendum Note (Signed)
Addended by: Skip Estimable on: 01/27/2021 11:57 AM   Modules accepted: Orders

## 2021-01-27 NOTE — Telephone Encounter (Signed)
Patient called back asking about his RX's.  RN informed him Metoprolol was approved and sent to [facility], he states he wants it to go to [facility].  He was instructed to call [facility] and ask they transfer RX.  He is also requesting a refill on his Seroquel to [facility].  He was informed furosemide was denied and he should have refills available from [facility], as Dr. Bjorn Pippin sent a RX on 11/10/20 w/ 3 refills.  Pt states he does not know who this Dr. Is and will need to ask his wife.  RN encouraged pt to call his mail off pharmacy for refills. SChaplin, RN,BSN

## 2021-01-30 ENCOUNTER — Other Ambulatory Visit (HOSPITAL_BASED_OUTPATIENT_CLINIC_OR_DEPARTMENT_OTHER): Payer: Self-pay

## 2021-01-30 ENCOUNTER — Other Ambulatory Visit (HOSPITAL_COMMUNITY): Payer: Self-pay

## 2021-01-30 MED ORDER — FUROSEMIDE 40 MG PO TABS: 40.0000 mg | ORAL_TABLET | ORAL | 3 refills | Status: DC | PRN

## 2021-01-30 MED ORDER — QUETIAPINE FUMARATE 50 MG PO TABS
50.0000 mg | ORAL_TABLET | Freq: Two times a day (BID) | ORAL | 0 refills | Status: DC
  Filled 2021-01-30: qty 60, 30d supply, fill #0

## 2021-01-30 MED ORDER — METOPROLOL TARTRATE 25 MG PO TABS
12.5000 mg | ORAL_TABLET | Freq: Two times a day (BID) | ORAL | 0 refills | Status: DC
  Filled 2021-01-30 (×2): qty 60, 60d supply, fill #0

## 2021-01-30 NOTE — Addendum Note (Signed)
Addended by: Earl Lagos on: 01/30/2021 04:34 PM   Modules accepted: Orders

## 2021-01-30 NOTE — Telephone Encounter (Signed)
Spoke with pt's wife, and informed her that his Lasix is being sent to [facility] pharmacy. Pt's wife told me that her husband might have accidentally threw his prescription for Metoprolol in the Trash, so I sent that to pharmacy as well, per nurse advice. I instructed the pt's wife to call the pharmacy about insurance coverage for Metoprolol.

## 2021-01-30 NOTE — Addendum Note (Signed)
Addended by: Earl Lagos on: 01/30/2021 04:33 PM   Modules accepted: Orders

## 2021-01-31 ENCOUNTER — Other Ambulatory Visit (HOSPITAL_COMMUNITY): Payer: Self-pay

## 2021-01-31 MED ORDER — FUROSEMIDE 40 MG PO TABS: 40.0000 mg | ORAL_TABLET | ORAL | 3 refills | Status: DC | PRN

## 2021-01-31 MED ORDER — FUROSEMIDE 40 MG PO TABS
40.0000 mg | ORAL_TABLET | ORAL | 3 refills | Status: DC | PRN
Start: 2021-01-31 — End: 2021-05-15
  Filled 2021-01-31: qty 30, 30d supply, fill #0
  Filled 2021-04-10: qty 30, 30d supply, fill #1

## 2021-01-31 NOTE — Addendum Note (Signed)
Addended by: Johney Frame A on: 01/31/2021 12:50 PM   Modules accepted: Orders

## 2021-02-10 ENCOUNTER — Other Ambulatory Visit (HOSPITAL_COMMUNITY): Payer: Self-pay

## 2021-02-12 ENCOUNTER — Other Ambulatory Visit (HOSPITAL_COMMUNITY): Payer: Self-pay

## 2021-02-13 ENCOUNTER — Other Ambulatory Visit (HOSPITAL_COMMUNITY): Payer: Self-pay

## 2021-02-13 ENCOUNTER — Ambulatory Visit: Payer: Medicare HMO | Admitting: Cardiology

## 2021-02-21 ENCOUNTER — Other Ambulatory Visit (HOSPITAL_COMMUNITY): Payer: Self-pay

## 2021-03-09 ENCOUNTER — Other Ambulatory Visit: Payer: Self-pay | Admitting: Student in an Organized Health Care Education/Training Program

## 2021-03-09 ENCOUNTER — Other Ambulatory Visit (HOSPITAL_COMMUNITY): Payer: Self-pay

## 2021-03-10 ENCOUNTER — Other Ambulatory Visit (HOSPITAL_COMMUNITY): Payer: Self-pay

## 2021-03-10 ENCOUNTER — Other Ambulatory Visit: Payer: Self-pay | Admitting: Student in an Organized Health Care Education/Training Program

## 2021-03-10 MED ORDER — BUPRENORPHINE HCL-NALOXONE HCL 8-2 MG SL FILM
1.0000 | ORAL_FILM | Freq: Two times a day (BID) | SUBLINGUAL | 1 refills | Status: DC
  Filled 2021-03-10: qty 60, 30d supply, fill #0
  Filled 2021-04-12: qty 60, 30d supply, fill #1

## 2021-03-14 ENCOUNTER — Other Ambulatory Visit (HOSPITAL_COMMUNITY): Payer: Self-pay

## 2021-03-15 ENCOUNTER — Other Ambulatory Visit (HOSPITAL_COMMUNITY): Payer: Self-pay

## 2021-03-20 ENCOUNTER — Other Ambulatory Visit: Payer: Self-pay | Admitting: *Deleted

## 2021-03-20 DIAGNOSIS — I712 Thoracic aortic aneurysm, without rupture, unspecified: Secondary | ICD-10-CM

## 2021-03-23 ENCOUNTER — Other Ambulatory Visit (HOSPITAL_COMMUNITY): Payer: Self-pay

## 2021-03-23 ENCOUNTER — Other Ambulatory Visit: Payer: Self-pay | Admitting: Student in an Organized Health Care Education/Training Program

## 2021-03-23 NOTE — Telephone Encounter (Signed)
Last visit 10/31/20   Next Appt 04/03/2021 at  9:15 With Internal Medicine Para March Orma Flaming, MD)

## 2021-03-24 ENCOUNTER — Other Ambulatory Visit (HOSPITAL_COMMUNITY): Payer: Self-pay

## 2021-03-24 MED ORDER — TESTOSTERONE CYPIONATE 200 MG/ML IM SOLN
400.0000 mg | INTRAMUSCULAR | 1 refills | Status: DC
  Filled 2021-03-24: qty 4, 28d supply, fill #0
  Filled 2021-05-05: qty 4, 28d supply, fill #1

## 2021-04-03 ENCOUNTER — Encounter: Payer: Medicare HMO | Admitting: Student in an Organized Health Care Education/Training Program

## 2021-04-06 ENCOUNTER — Other Ambulatory Visit (HOSPITAL_COMMUNITY): Payer: Self-pay

## 2021-04-10 ENCOUNTER — Other Ambulatory Visit: Payer: Self-pay | Admitting: Internal Medicine

## 2021-04-10 ENCOUNTER — Other Ambulatory Visit (HOSPITAL_COMMUNITY): Payer: Self-pay

## 2021-04-10 ENCOUNTER — Other Ambulatory Visit (HOSPITAL_BASED_OUTPATIENT_CLINIC_OR_DEPARTMENT_OTHER): Payer: Self-pay | Admitting: Cardiology

## 2021-04-10 MED ORDER — METOPROLOL TARTRATE 25 MG PO TABS
12.5000 mg | ORAL_TABLET | Freq: Two times a day (BID) | ORAL | 0 refills | Status: DC
  Filled 2021-04-10: qty 60, 60d supply, fill #0

## 2021-04-11 NOTE — Telephone Encounter (Signed)
Next appt scheduled 07/25/20 with PCP. °

## 2021-04-12 ENCOUNTER — Other Ambulatory Visit: Payer: Self-pay | Admitting: *Deleted

## 2021-04-12 ENCOUNTER — Other Ambulatory Visit: Payer: Self-pay | Admitting: Internal Medicine

## 2021-04-12 ENCOUNTER — Other Ambulatory Visit (HOSPITAL_COMMUNITY): Payer: Self-pay

## 2021-04-12 NOTE — Telephone Encounter (Signed)
Patient called in requesting refill on suboxone, lasix, metoprolol and Seroquel. Patient has refill on suboxone at [facility]. He is advised to call them to get refill ready. Lasix and metoprolol last sent by cards. He will contact them for these refills. Will forward request for Seroquel.

## 2021-04-13 ENCOUNTER — Other Ambulatory Visit (HOSPITAL_COMMUNITY): Payer: Self-pay

## 2021-04-13 ENCOUNTER — Other Ambulatory Visit: Payer: Self-pay | Admitting: Internal Medicine

## 2021-04-13 NOTE — Telephone Encounter (Signed)
Called pt about Seroquel. He stated he's unsure why he's on this medication; he does not take it everyday (has about 15 tabs) and his wife told him it's for his "mood". Stated if his doctor wants him to take it, he will.

## 2021-04-17 ENCOUNTER — Other Ambulatory Visit (HOSPITAL_COMMUNITY): Payer: Self-pay

## 2021-04-17 ENCOUNTER — Other Ambulatory Visit: Payer: Self-pay | Admitting: Internal Medicine

## 2021-04-18 ENCOUNTER — Other Ambulatory Visit (HOSPITAL_COMMUNITY): Payer: Self-pay

## 2021-05-01 ENCOUNTER — Other Ambulatory Visit (HOSPITAL_COMMUNITY): Payer: Self-pay

## 2021-05-01 ENCOUNTER — Other Ambulatory Visit: Payer: Self-pay

## 2021-05-01 ENCOUNTER — Ambulatory Visit: Payer: Medicare HMO | Admitting: Physician Assistant

## 2021-05-01 ENCOUNTER — Ambulatory Visit
Admission: RE | Admit: 2021-05-01 | Discharge: 2021-05-01 | Disposition: A | Discharge status: Home / Self Care | Payer: Medicare HMO | Source: Ambulatory Visit | Attending: Surgery | Admitting: Surgery

## 2021-05-01 VITALS — BP 133/84 | HR 95 | Resp 20 | Ht 71.0 in | Wt 210.0 lb

## 2021-05-01 DIAGNOSIS — I10 Essential (primary) hypertension: Secondary | ICD-10-CM | POA: Diagnosis not present

## 2021-05-01 DIAGNOSIS — R911 Solitary pulmonary nodule: Secondary | ICD-10-CM | POA: Diagnosis not present

## 2021-05-01 DIAGNOSIS — G4700 Insomnia, unspecified: Secondary | ICD-10-CM

## 2021-05-01 DIAGNOSIS — R600 Localized edema: Secondary | ICD-10-CM | POA: Diagnosis not present

## 2021-05-01 DIAGNOSIS — I712 Thoracic aortic aneurysm, without rupture, unspecified: Secondary | ICD-10-CM

## 2021-05-01 DIAGNOSIS — I7121 Aneurysm of the ascending aorta, without rupture: Secondary | ICD-10-CM | POA: Diagnosis not present

## 2021-05-01 IMAGING — CT CT CHEST W/O CM
2 of 4 series · 11 of 36 positions shown, 13 images · non-contrast
Comparison: [DATE]

CLINICAL DATA: Thoracic aortic aneurysm.

EXAM:
CT CHEST WITHOUT CONTRAST
TECHNIQUE: Multidetector CT imaging of the chest was performed following the
standard protocol without IV contrast.

[Series 2: chest 2.00 br40 s3 · axial · 0.59mm/px · z∈[+1580,+1842]mm · 8 of 155 slices shown, 10 images (1 of 2)]
[im 12/155  mediastinal]
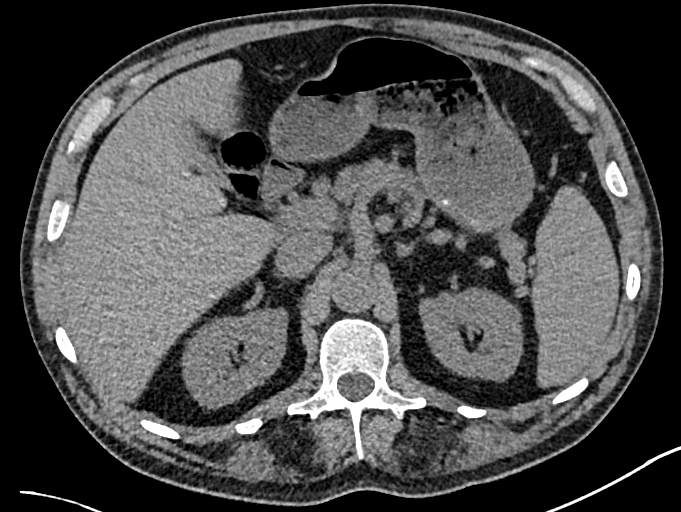
[im 12/155  lung]
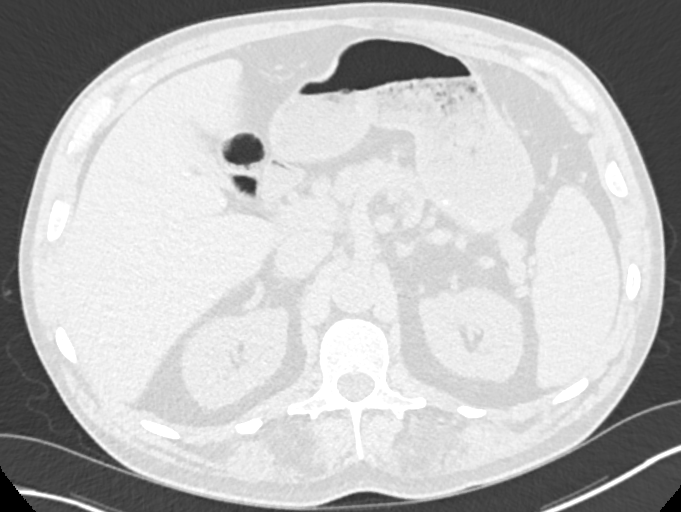
[im 36/155  lung]
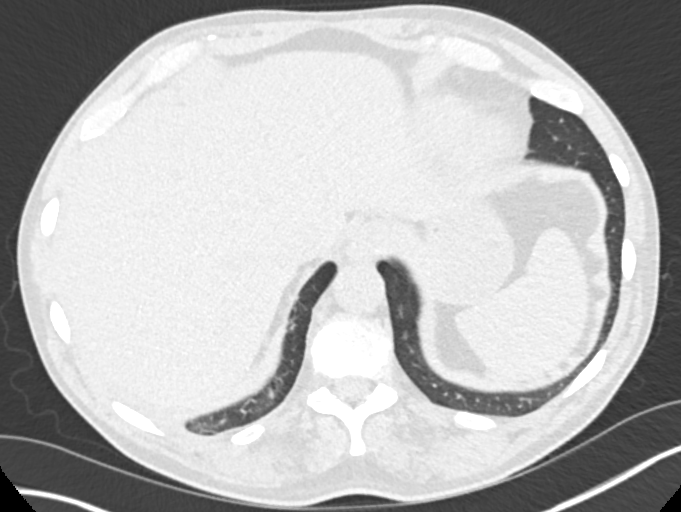
[im 48/155  lung]
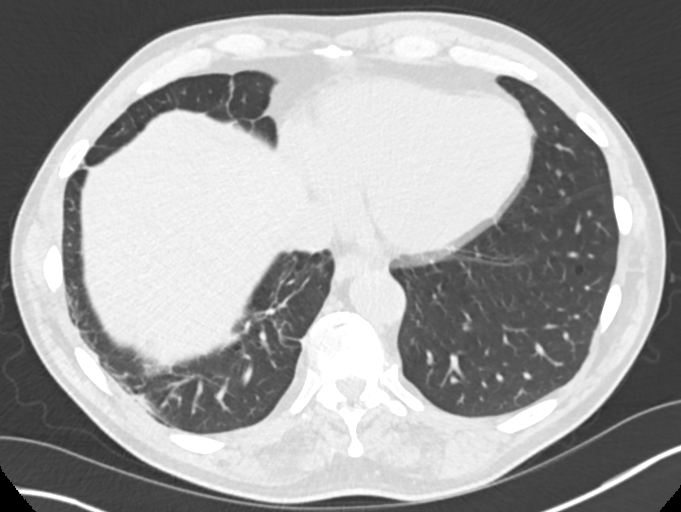
[im 72/155  lung]
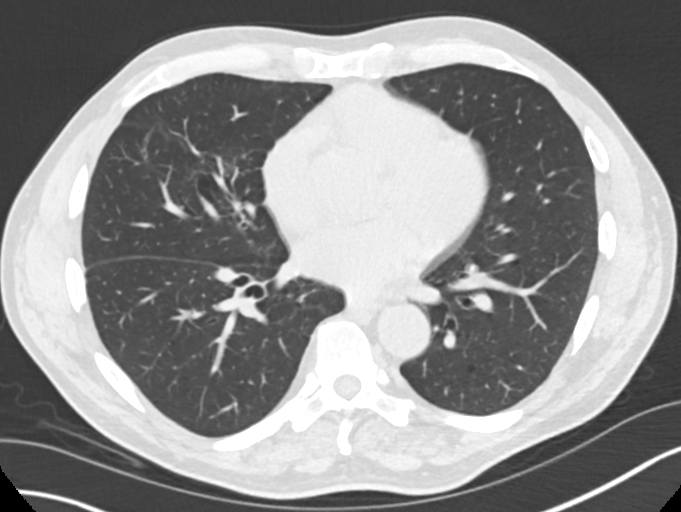
[im 83/155  mediastinal]
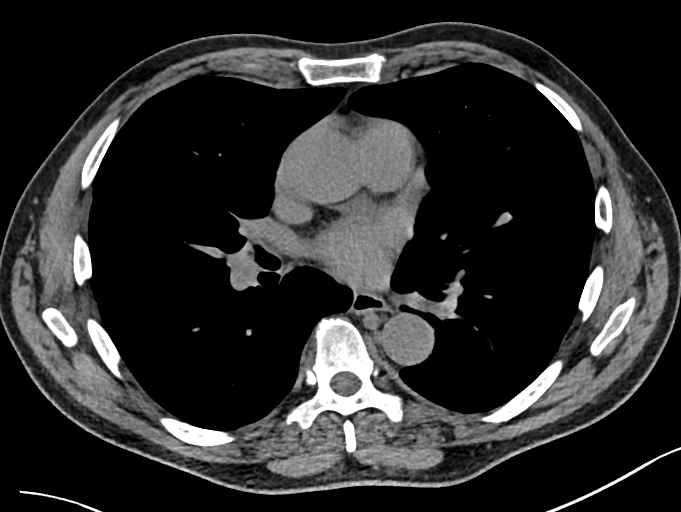
[im 83/155  lung]
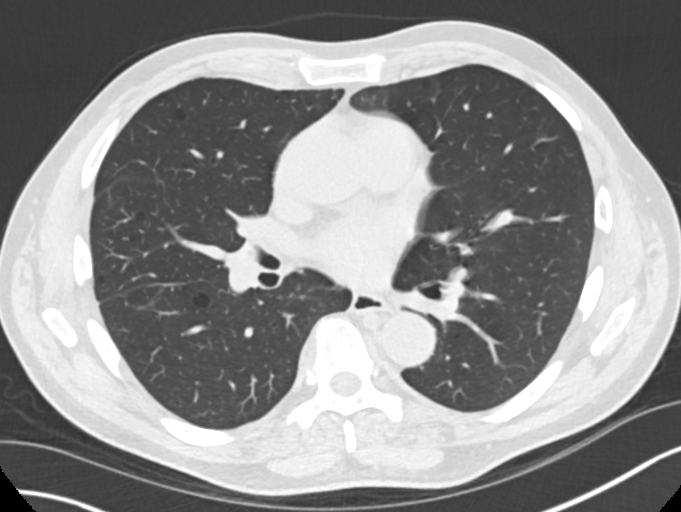
[im 107/155  lung]
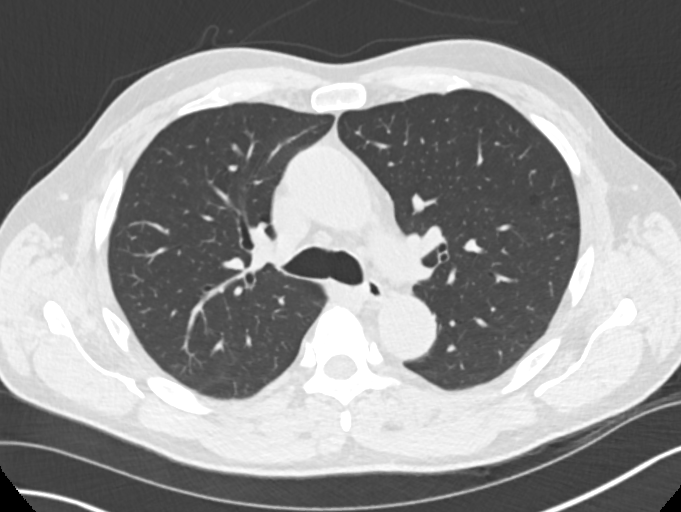
[im 119/155  lung]
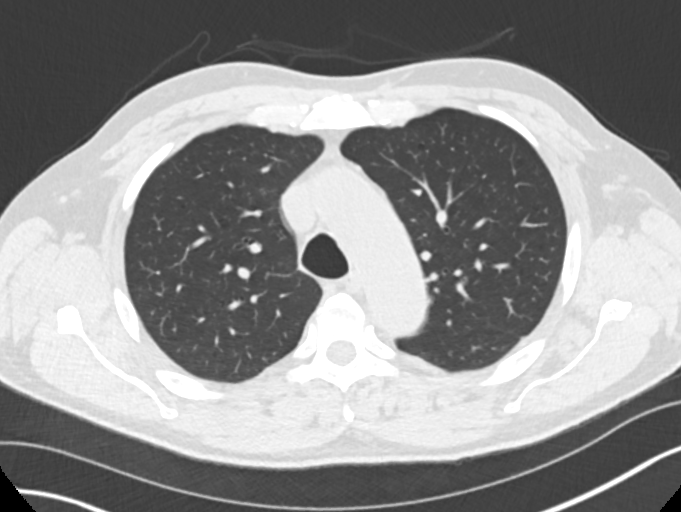
[im 143/155  lung]
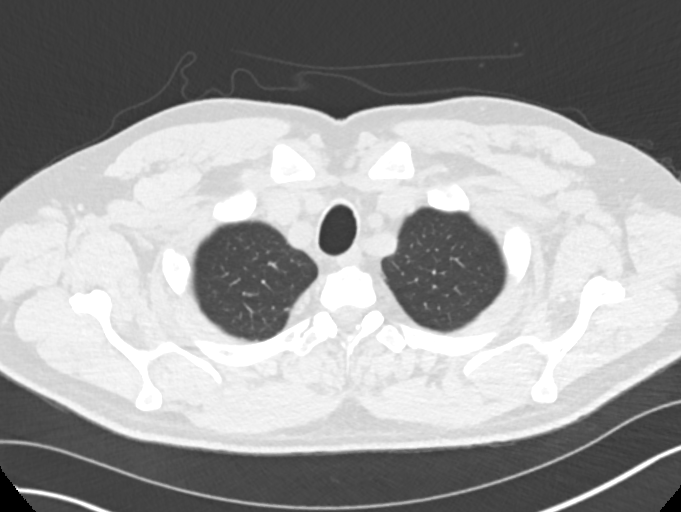

[Series 4: chest 2.00 br40 s3 · coronal · 0.61mm/px · 3 of 151 slices shown (2 of 2)]
[im 31/151  lung]
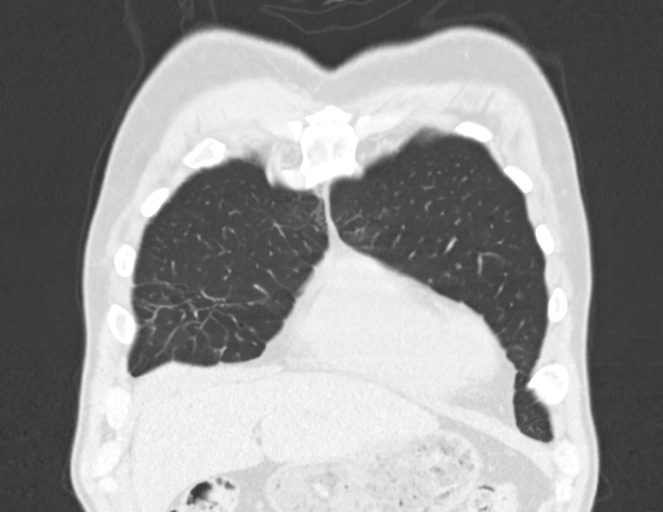
[im 61/151  lung]
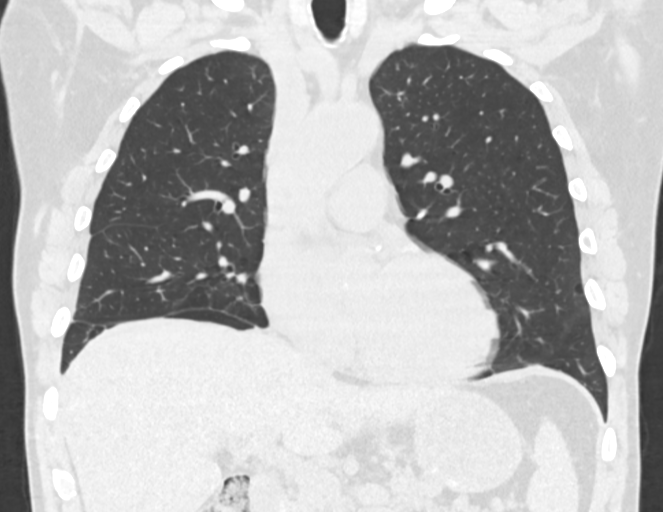
[im 91/151  lung]
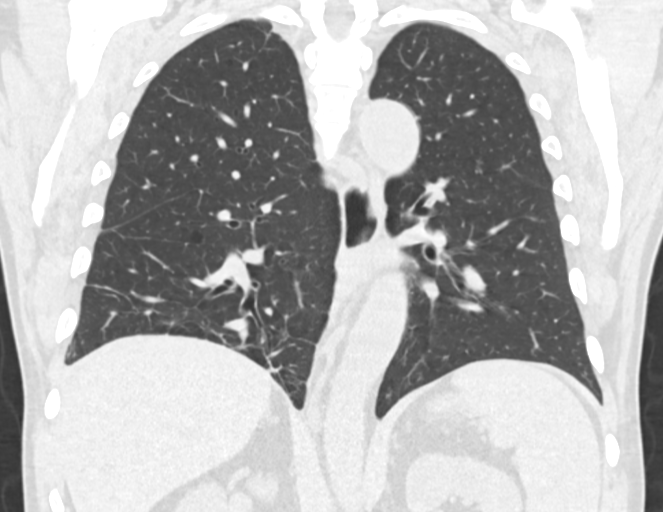

[11 of 36 positions shown; findings below may reference images not displayed]

FINDINGS: Cardiovascular: The heart size is normal. No substantial pericardial
effusion. Coronary artery calcification is evident. Ascending
thoracic aorta measures approximately 4.4 diameter which is similar
to prior (when remeasured).

Mediastinum/Nodes: No mediastinal lymphadenopathy. No evidence for
gross hilar lymphadenopathy although assessment is limited by the
lack of intravenous contrast on the current study. The esophagus has
normal imaging features. There is no axillary lymphadenopathy.

Lungs/Pleura: Marked interval improvement in aeration of the right
lung with 6 mm subpleural right middle lobe nodule now visible on
87/8. Architectural distortion and scarring noted in the right lower
lung. No new suspicious pulmonary nodule or mass. No pleural
effusion.

Upper Abdomen: Unremarkable.

Musculoskeletal: No worrisome lytic or sclerotic osseous
abnormality.
IMPRESSION: 1. Marked interval improvement in aeration of the right lung with 6
mm subpleural right middle lobe nodule now visible.
2. Stable ascending thoracic aortic aneurysm measuring up to
diameter [DATE]. Ascending aorta was measured at 4.2 cm on the
study from [DATE]. Recommend annual imaging followup by CTA or
MRA. This recommendation follows [K6]
ACCF/AHA/AATS/ACR/ASA/SCA/SONGAILAITE/SONGAILAITE/SONGAILAITE/SONGAILAITE Guidelines for the
Diagnosis and Management of Patients with Thoracic Aortic Disease.
Circulation. [K6]; 121: [id number]. Aortic aneurysm NOS ([K6]-[K6])
.
3. Coronary artery atherosclerosis.

Comparing to

## 2021-05-01 NOTE — Progress Notes (Signed)
[address]       [place] [address]             [phone]     CARDIOTHORACIC SURGERY office visitation  Referring Provider is Love, Evlyn Kanner, PA-C Primary Cardiologist is [name], MD PCP is [name], MD  Chief Complaint  Patient presents with   Thoracic Aortic Aneurysm    Surgical consult, CT Chest 09/13/20, TEE 09/16/20    HPI:  55 year old man was recently admitted to the hospital for severe pneumonia.  He required prolonged intubation, tracheostomy and significant postoperative recovery.  During his evaluation he was found to have a asymptomatic ascending aortic aneurysm.  This measures approximately 4.3 cm in maximal diameter.  The patient has no prior personal history of aneurysm nor any significant history of family aneurysms.  He has been convalescing reasonably well but is still not back to baseline.  During his hospitalization for necrotizing pneumonia, he underwent echocardiogram which demonstrated a normal aortic valve morphology.   Today he returns the office for a routine CT scan for aortic aneurysmal surveillance.  He was recently hospitalized for necrotizing pneumonia and was in the hospital for 4 weeks on antibiotics.  The only residual effects that he has from that hospitalization and some peripheral neuropathy.  He does feel like his breathing has much improved since that incident.  He has not experienced any chest pain.  He states overall his blood pressure has been well controlled at home but is encouraged to reach out to his cardiologist if he gets a reading over 145 SBP.  Past Medical History:  Diagnosis Date   Acute hypoxemic respiratory failure (HCC)    Acute respiratory failure with hypoxia (HCC) 08/28/2020   CAP (community acquired pneumonia) 08/28/2020   Degenerative disc disease, lumbar    Dental crown present    ETOH abuse    History of kidney stones    Hypertension    states under control with  meds., has been on med. x 1 yr. - is currently out of med., to see PCP 02/06/2018   Marijuana use, continuous    MRSA pneumonia (HCC) 09/09/2020   Muscle atrophy 01/2018   Muscle weakness 01/2018   Necrotizing myopathy    Necrotizing pneumonia (HCC) 09/09/2020   Opioid use disorder    Pleural effusion    Sepsis (HCC) 08/28/2020   Thoracic aortic aneurysm (TAA) (HCC)    a. seen on CT 08/2020.    Past Surgical History:  Procedure Laterality Date   ENDOVENOUS ABLATION SAPHENOUS VEIN W/ LASER Right 12-29-2015   endovenous laser ablation right greater saphenous vein, stab phlebectomy > 20 incisions right leg, sclerotherapy right leg by Gretta Began MD     MINOR MUSCLE BIOPSY Right 02/11/2018   Procedure: RECTUS FEMORIS MUSCLE BIOPSY;  Surgeon: Darnell Level, MD;  Location: [facility];  Service: General;  Laterality: Right;   MUSCLE BIOPSY Right 02/11/2018   Procedure: DELTOID MUSCLE BIOPSY;  Surgeon: Darnell Level, MD;  Location: [facility];  Service: General;  Laterality: Right;   NASAL FRACTURE SURGERY     TRACHEOSTOMY TUBE PLACEMENT N/A 09/14/2020   Procedure: TRACHEOSTOMY;  Surgeon: Suzanna Obey, MD;  Location: [facility];  Service: ENT;  Laterality: N/A;    Family History  Problem Relation Age of Onset   Varicose Veins Brother    Drug abuse Brother     Social History   Socioeconomic History  Marital status: Married    Spouse name: Not on file   Number of children: 1   Years of education: 59   Highest education level: Some college, no degree  Occupational History   Occupation: not working  Tobacco Use   Smoking status: Never Smoker   Smokeless tobacco: Former Forensic psychologist Use: Never used  Substance and Sexual Activity   Alcohol use: Yes    Comment: occasionally   Drug use: No   Sexual activity: Yes    Partners: Female  Other Topics Concern   Not on file  Social History Narrative   Lives with wife and daughter in a one story home.  Has one  child.  Currently not working.  Education: some college.    Social Determinants of Health   Financial Resource Strain: Not on file  Food Insecurity: Not on file  Transportation Needs: Not on file  Physical Activity: Not on file  Stress: Not on file  Social Connections: Not on file  Intimate Partner Violence: Not on file    Current Outpatient Medications  Medication Sig Dispense Refill   apixaban (ELIQUIS) 5 MG TABS tablet TAKE 1 TABLET BY MOUTH TWICE DAILY. 60 tablet 0   furosemide (LASIX) 40 MG tablet Take 1 tablet (40 mg total) by mouth daily. 30 tablet 0   metoprolol tartrate (LOPRESSOR) 25 MG tablet Take 1/2 tablet by mouth 2 times daily. 30 tablet 0   QUEtiapine (SEROQUEL) 50 MG tablet TAKE 1 TABLET (50 MG TOTAL) BY MOUTH 2 (TWO) TIMES DAILY. 60 tablet 0   sildenafil (VIAGRA) 25 MG tablet Take 1 tablet (25 mg total) by mouth as needed for erectile dysfunction. 20 tablet 1   testosterone cypionate (DEPOTESTOSTERONE CYPIONATE) 200 MG/ML injection INJECT 2 MLS INTO THE MUSCLE EVERY 2 WEEKS 4 mL 1   Buprenorphine HCl-Naloxone HCl 8-2 MG FILM Place 1 Film under the tongue in the morning and at bedtime. 60 each 1   gabapentin (NEURONTIN) 300 MG capsule Take 1 capsule (300 mg total) by mouth 3 (three) times daily. 90 capsule 1   No current facility-administered medications for this visit.    Allergies  Allergen Reactions   Vicodin [Hydrocodone-Acetaminophen] Nausea And Vomiting        Physical Exam:    Vitals:   05/01/21 1104  BP: 133/84  Pulse: 95  Resp: 20  SpO2: 96%     General:    well-appearing  Neck:   no JVD, no bruits  Chest:   Clear to auscultation bilaterally and in all fields  CV:   RRR, no murmur   Extremities:  warm, well-perfused, pulses intact throughout, trace pedal edema  Neuro:   Grossly non-focal and symmetrical throughout  Skin:   Clean and dry, no rashes, no breakdown   Diagnostic Tests:     ECHOCARDIOGRAM REPORT         Patient Name:    MATE ALEGRIA Date of Exam: 12/08/2020  Medical Rec #:  [id number]      Height:       71.0 in  Accession #:    [id number]     Weight:       200.4 lb  Date of Birth:  12/13/1965      BSA:          2.110 m  Patient Age:    55 years       BP:           118/68  mmHg  Patient Gender: M              HR:           92 bpm.  Exam Location:  [address]   Procedure: 2D Echo, Cardiac Doppler, Color Doppler and Intracardiac             Opacification Agent   Indications:    I50.9* Heart failure (unspecified)     History:        Patient has prior history of Echocardiogram examinations,  most                  recent 09/16/2020. Arrythmias:Atrial Fibrillation; Risk                  Factors:Hypertension.     Sonographer:    Cathie Beams RCS  Referring Phys: [id number] CHRISTOPHER L SCHUMANN   IMPRESSIONS     1. Left ventricular ejection fraction, by estimation, is 55 to 60%. The  left ventricle has normal function. The left ventricle has no regional  wall motion abnormalities. There is mild asymmetric left ventricular  hypertrophy of the basal-septal segment.  Left ventricular diastolic parameters were normal.   2. Right ventricular systolic function is normal. The right ventricular  size is normal. Tricuspid regurgitation signal is inadequate for assessing  PA pressure.   3. Left atrial size was mild to moderately dilated.   4. The mitral valve is normal in structure. Trivial mitral valve  regurgitation. No evidence of mitral stenosis.   5. The aortic valve is tricuspid. There is mild calcification of the  aortic valve. There is mild thickening of the aortic valve. Aortic valve  regurgitation is not visualized. Mild aortic valve sclerosis is present,  with no evidence of aortic valve  stenosis.   6. Aortic dilatation noted. There is mild dilatation of the ascending  aorta, measuring 41 mm.   7. The inferior vena cava is normal in size with greater than 50%  respiratory variability, suggesting  right atrial pressure of 3 mmHg.   Comparison(s): Prior images reviewed side by side. Changes from prior  study are noted. EF improved compared to prior study.   Conclusion(s)/Recommendation(s): Otherwise normal echocardiogram, with  minor abnormalities described in the report.   FINDINGS   Left Ventricle: Left ventricular ejection fraction, by estimation, is 55  to 60%. The left ventricle has normal function. The left ventricle has no  regional wall motion abnormalities. Definity contrast agent was given IV  to delineate the left ventricular   endocardial borders. The left ventricular internal cavity size was normal  in size. There is mild asymmetric left ventricular hypertrophy of the  basal-septal segment. Left ventricular diastolic parameters were normal.   Right Ventricle: The right ventricular size is normal. No increase in  right ventricular wall thickness. Right ventricular systolic function is  normal. Tricuspid regurgitation signal is inadequate for assessing PA  pressure.   Left Atrium: Left atrial size was mild to moderately dilated.   Right Atrium: Right atrial size was normal in size.   Pericardium: There is no evidence of pericardial effusion.   Mitral Valve: The mitral valve is normal in structure. Trivial mitral  valve regurgitation. No evidence of mitral valve stenosis.   Tricuspid Valve: The tricuspid valve is normal in structure. Tricuspid  valve regurgitation is trivial. No evidence of tricuspid stenosis.   Aortic Valve: The aortic valve is tricuspid. There is mild calcification  of the aortic valve. There is  mild thickening of the aortic valve. Aortic  valve regurgitation is not visualized. Mild aortic valve sclerosis is  present, with no evidence of aortic  valve stenosis.   Pulmonic Valve: The pulmonic valve was not well visualized. Pulmonic valve  regurgitation is not visualized. No evidence of pulmonic stenosis.   Aorta: Aortic dilatation noted.  There is mild dilatation of the ascending  aorta, measuring 41 mm.   Venous: The inferior vena cava is normal in size with greater than 50%  respiratory variability, suggesting right atrial pressure of 3 mmHg.   IAS/Shunts: The interatrial septum was not well visualized.      LEFT VENTRICLE  PLAX 2D  LVIDd:         4.70 cm  Diastology  LVIDs:         3.20 cm  LV e' medial:    8.15 cm/s  LV PW:         1.10 cm  LV E/e' medial:  8.5  LV IVS:        1.60 cm  LV e' lateral:   13.23 cm/s  LVOT diam:     2.55 cm  LV E/e' lateral: 5.2  LV SV:         120  LV SV Index:   57  LVOT Area:     5.11 cm      RIGHT VENTRICLE  RV S prime:     11.10 cm/s  TAPSE (M-mode): 2.0 cm   LEFT ATRIUM            Index  LA diam:      4.30 cm  2.04 cm/m  LA Vol (A4C): 100.0 ml 47.39 ml/m   AORTIC VALVE  LVOT Vmax:   114.00 cm/s  LVOT Vmean:  77.900 cm/s  LVOT VTI:    0.234 m     AORTA  Ao Root diam: 3.80 cm   MITRAL VALVE  MV Area (PHT): 3.65 cm    SHUNTS  MV Decel Time: 208 msec    Systemic VTI:  0.23 m  MR Peak grad: 98.4 mmHg    Systemic Diam: 2.55 cm  MR Mean grad: 62.0 mmHg  MR Vmax:      496.00 cm/s  MR Vmean:     363.0 cm/s  MV E velocity: 69.00 cm/s  MV A velocity: 96.40 cm/s  MV E/A ratio:  0.72   Jodelle Red MD  Electronically signed by Jodelle Red MD  Signature Date/Time: 12/08/2020/9:37:58 PM         Final  CLINICAL DATA:  Thoracic aortic aneurysm.   EXAM: CT CHEST WITHOUT CONTRAST   TECHNIQUE: Multidetector CT imaging of the chest was performed following the standard protocol without IV contrast.   COMPARISON:  09/13/2020   FINDINGS: Cardiovascular: The heart size is normal. No substantial pericardial effusion. Coronary artery calcification is evident. Ascending thoracic aorta measures approximately 4.4 diameter which is similar to prior (when remeasured).   Mediastinum/Nodes: No mediastinal lymphadenopathy. No evidence for gross hilar  lymphadenopathy although assessment is limited by the lack of intravenous contrast on the current study. The esophagus has normal imaging features. There is no axillary lymphadenopathy.   Lungs/Pleura: Marked interval improvement in aeration of the right lung with 6 mm subpleural right middle lobe nodule now visible on 87/8. Architectural distortion and scarring noted in the right lower lung. No new suspicious pulmonary nodule or mass. No pleural effusion.   Upper Abdomen: Unremarkable.   Musculoskeletal: No worrisome lytic or sclerotic  osseous abnormality.   IMPRESSION: 1. Marked interval improvement in aeration of the right lung with 6 mm subpleural right middle lobe nodule now visible. 2. Stable ascending thoracic aortic aneurysm measuring up to 4.4 diameter 09/13/2020. Ascending aorta was measured at 4.2 cm on the study from 09/05/2020. Recommend annual imaging followup by CTA or MRA. This recommendation follows 2010 ACCF/AHA/AATS/ACR/ASA/SCA/SCAI/SIR/STS/SVM Guidelines for the Diagnosis and Management of Patients with Thoracic Aortic Disease. Circulation. 2010; 121: S239-R320. Aortic aneurysm NOS (ICD10-I71.9) . 3. Coronary artery atherosclerosis.   Comparing to     Electronically Signed   By: Kennith Center M.D.   On: 05/01/2021 10:52   CLINICAL DATA:  Respiratory failure, necrotizing pneumonia of right lung with associated empyema and status post right-sided chest tube placement with administration of intrapleural lytic therapy.   EXAM: CT CHEST WITHOUT CONTRAST   TECHNIQUE: Multidetector CT imaging of the chest was performed following the standard protocol without IV contrast.   COMPARISON:  Prior CT studies on 08/31/2020 and 08/27/2020.   FINDINGS: Cardiovascular: Stable heart size and coronary artery calcifications. Stable mild dilatation of the ascending thoracic aorta which measures up to approximately 4.2 cm in greatest diameter. Left jugular central  line present extending into the upper SVC.   Mediastinum/Nodes: Scattered small mediastinal lymph nodes appears stable and likely are reactive.   Lungs/Pleura: Left lateral chest tube enters the lower pleural space and ascends in the posterior pleural space. It is somewhat difficult to separate pleural abnormality from adjacent parenchymal abnormality without the presence of IV contrast. However, there does not appear to be a large amount of residual pleural fluid present and most of the low-density abnormality in the right mid to lower chest is felt to represent necrotic and consolidated right lower lobe and right middle lobe. There is still some fluid component likely present in the pleural space posteriorly, mainly superior to the tip of the indwelling chest tube. No pneumothorax. Atelectasis present involving the posterior left lower lobe.   Upper Abdomen: No acute abnormality.   Musculoskeletal: No chest wall mass or suspicious bone lesions identified.   IMPRESSION: 1. No large amount of residual pleural fluid present with most of the low-density abnormality in the right mid to lower chest felt to represent necrotic and consolidated right lower lobe and right middle lobe. There is still some fluid component likely present in the pleural space posteriorly, mainly superior to the tip of the indwelling chest tube. It is difficult to differentiate pleural from parenchymal abnormality due to similar densities by CT and any follow-up imaging of the chest by CT would benefit from administration of IV contrast, if possible. 2. Stable mild dilatation of the ascending thoracic aorta which measures up to approximately 4.2 cm in greatest diameter.     Electronically Signed   By: Irish Lack M.D.   On: 09/05/2020 15:03   Impression:  55 year old man recovering from recent debilitating pneumonia and has an incidentally discovered sub-5 cm ascending aortic aneurysm.  This poses  little risk for urgent aortic condition based on his overall medical condition and small size.  His ascending aortic aneurysm is measuring 4.4 cm currently measured last year at 4.2 cm.  New 6 mm subpleural right middle lobe nodule now visible.  Not well visualized on his last CT due to extensive pleural fluid.  Hypertension-well-controlled on current regimen.  He has been taking 25 mg of metoprolol twice a day versus what is written on his medical list.  I would encourage him to  continue this routine.  Lower extremity edema-he is on 40 mg of Lasix which she had been splitting up into 20 mg tabs twice a day.  I recommended taking a full 40 mg in the morning to avoid urination at night.  Insomnia-he is currently on Seroquel twice a day.  I would continue with this and if the insomnia persist to talk to his primary care.   Plan:  Follow-up with CT scan in 1 year. He needs to get an at home blood pressure cuff.  He will follow-up with the surgeons since he did have some slight growth in his ascending aortic aneurysm and a new 6 mm right middle lobe nodule.  Recommended no heavy lifting over 25 pounds and to avoid fluoroquinolones.    Jari Favre, PA-C

## 2021-05-05 ENCOUNTER — Other Ambulatory Visit (HOSPITAL_COMMUNITY): Payer: Self-pay

## 2021-05-15 ENCOUNTER — Other Ambulatory Visit (HOSPITAL_COMMUNITY): Payer: Self-pay

## 2021-05-15 ENCOUNTER — Ambulatory Visit (INDEPENDENT_AMBULATORY_CARE_PROVIDER_SITE_OTHER): Payer: Medicare HMO | Admitting: Student in an Organized Health Care Education/Training Program

## 2021-05-15 ENCOUNTER — Encounter: Payer: Self-pay | Admitting: Student in an Organized Health Care Education/Training Program

## 2021-05-15 VITALS — BP 119/90 | HR 74 | Temp 97.5°F | Ht 71.0 in | Wt 210.7 lb

## 2021-05-15 DIAGNOSIS — I712 Thoracic aortic aneurysm, without rupture, unspecified: Secondary | ICD-10-CM | POA: Diagnosis not present

## 2021-05-15 DIAGNOSIS — I10 Essential (primary) hypertension: Secondary | ICD-10-CM | POA: Diagnosis not present

## 2021-05-15 DIAGNOSIS — Z0000 Encounter for general adult medical examination without abnormal findings: Secondary | ICD-10-CM | POA: Diagnosis not present

## 2021-05-15 DIAGNOSIS — F1190 Opioid use, unspecified, uncomplicated: Secondary | ICD-10-CM | POA: Diagnosis not present

## 2021-05-15 DIAGNOSIS — Z1211 Encounter for screening for malignant neoplasm of colon: Secondary | ICD-10-CM | POA: Diagnosis not present

## 2021-05-15 DIAGNOSIS — R7989 Other specified abnormal findings of blood chemistry: Secondary | ICD-10-CM

## 2021-05-15 MED ORDER — BUPRENORPHINE HCL-NALOXONE HCL 8-2 MG SL FILM
1.0000 | ORAL_FILM | Freq: Two times a day (BID) | SUBLINGUAL | 1 refills | Status: DC
  Filled 2021-05-15: qty 60, 30d supply, fill #0
  Filled 2021-06-27: qty 60, 30d supply, fill #1

## 2021-05-15 MED ORDER — QUETIAPINE FUMARATE 50 MG PO TABS
50.0000 mg | ORAL_TABLET | Freq: Every day | ORAL | 1 refills | Status: DC
  Filled 2021-05-15: qty 90, 90d supply, fill #0

## 2021-05-15 MED ORDER — METOPROLOL TARTRATE 25 MG PO TABS
25.0000 mg | ORAL_TABLET | Freq: Two times a day (BID) | ORAL | 3 refills | Status: DC
  Filled 2021-05-15: qty 180, 90d supply, fill #0

## 2021-05-15 MED ORDER — CHLORTHALIDONE 25 MG PO TABS
25.0000 mg | ORAL_TABLET | Freq: Every day | ORAL | 3 refills | Status: DC
  Filled 2021-05-15: qty 90, 90d supply, fill #0
  Filled 2021-08-18: qty 90, 90d supply, fill #1
  Filled 2021-11-23: qty 90, 90d supply, fill #2
  Filled 2022-02-28: qty 90, 90d supply, fill #3

## 2021-05-15 NOTE — Assessment & Plan Note (Addendum)
Most recent chest CT on 05/01/21 showed thoracic aortic dilation at 4.4cm, unchanged from 03/22. He will continue to follow with cardiac surgery. Counseled to abstain from intensive physical activity.  Continue metoprolol 25 mg twice daily.  We will start chlorthalidone for better blood pressure control as well.  Follow-up CT scan in October 2023.

## 2021-05-15 NOTE — Progress Notes (Signed)
  Subjective:     Patient ID: Joseph Ayala, male   DOB: 01-Dec-1965, 55 y.o.   MRN: [id number]   Joseph Ayala is a 55 y/o who presents to clinic for routine follow-up. He continues to improve following his hospitalization in March of this year. Today he expresses concern about his blood pressure, which he reports has been elevated at home. His blood pressure in office today is 119/90, but his home measurements have been elevated, with his most recent measurement yesterday of 158/119 and a systolic pressure in the 140s the day prior. He checks his blood pressure first thing in the morning with an electronic cuff, but says the measurements are consistent with manual measurements taken by his wife, who is a Engineer, civil (consulting). He is concerned about his elevated blood pressure in the setting of his thoracic aortic aneurysm, which was recently measured at 4.4 cm on CT scan.  Otherwise, he reports continued neuropathic pain in his feet, which continues to improve post-hospitalization. He stopped taking the gabapentin because it made his head feel "cloudy." He stopped taking the seroquel for a time, but noted that his mood worsened, and so continues to take it regularly.  Medication Refill Associated symptoms include coughing and diaphoresis. Pertinent negatives include no chest pain, headaches, nausea or vomiting.    Review of Systems  Constitutional:  Positive for diaphoresis.       Reports diaphoresis in the absence of elevated body temperature ever since his hospitalization. This continues to improve.  Respiratory:  Positive for cough. Negative for chest tightness.        Intermittent chronic cough  Cardiovascular:  Positive for leg swelling. Negative for chest pain.       Leg swelling occurs if he does not take furosemide  Gastrointestinal:  Negative for constipation, diarrhea, nausea and vomiting.  Neurological:  Negative for light-headedness and headaches.  Psychiatric/Behavioral:  The patient is  nervous/anxious.        Reports some spells of anxiety where he "finds it hard to breathe."      Objective:   Physical Exam Constitutional:      Appearance: Normal appearance.  HENT:     Head: Normocephalic and atraumatic.  Cardiovascular:     Rate and Rhythm: Normal rate and regular rhythm.     Pulses: Normal pulses.     Heart sounds: Normal heart sounds.  Pulmonary:     Effort: Pulmonary effort is normal. No respiratory distress.     Breath sounds: Normal breath sounds.  Abdominal:     General: Abdomen is flat. Bowel sounds are normal.     Tenderness: There is no abdominal tenderness.  Musculoskeletal:     Right lower leg: No edema.     Left lower leg: No edema.  Skin:    General: Skin is warm and dry.  Neurological:     Mental Status: He is alert.       Assessment:     See problem-based charting

## 2021-05-15 NOTE — Assessment & Plan Note (Signed)
Administered annual flu shot, PCV20 vaccine, and stool FIT self-test today. Reports no prior colonoscopy and no family history of colon cancer.  Plan: - Flu shot - PCV20 immunization - Stool FIT test

## 2021-05-15 NOTE — Assessment & Plan Note (Addendum)
BP in office today 119/90. Probably has masked hypertension. He reports consistently elevated blood pressures at home. Yesterday his blood pressure was 158/119, and was in the 140s systolic the day before. He measures his blood pressure every morning before taking his medications. His wife, who is an Charity fundraiser, has also measured his blood pressure manually with similar results to the cuff. Given the consistently elevated blood pressures with measurements reproducible on manual measurement, will plan to start him on chorthalidone for better blood pressure management today. Will stop furosemide and instruct patient to call if leg swelling worsens.  Plan: - Start chlorthalidone 25mg  daily - Stop furosemide 40mg 

## 2021-05-15 NOTE — Assessment & Plan Note (Signed)
Well-controlled with suboxone twice daily. Plan to refill medication today and collect ToxAssure.

## 2021-05-15 NOTE — Progress Notes (Signed)
Attestation for Student Documentation:  I personally was present and performed or re-performed the history, physical exam and medical decision-making activities of this service and have verified that the service and findings are accurately documented in the student's note.  55 year old person here for follow-up of opioid use disorder.  Doing well the past 6 months, continues to recover nicely from his hospitalization with severe acquired pneumonia earlier this year.  Back to his regular weights.  Went over his medications carefully.  He has been checking his blood pressure consistently and seeing elevated readings at home, his wife who is a nurse has confirmed these readings with manuals.  Likely he has masked hypertension.  This carries a little more risk because of his mild thoracic aortic artery aneurysm.  We will choose to treat this with chlorthalidone 25 mg daily.  He will continue checking his blood pressures at home and call me if he has any hypotensive episodes.  We will continue with management of his opioid use disorder with Suboxone 8 mg twice daily, check a tox assure today, I reviewed the database which was appropriate.  For his preventative care, we provided him with pneumococcal vaccination, influenza vaccination, and colon cancer screening with a FIT.  Follow-up with me in 3 months for blood pressure recheck.  [name], MD 05/15/2021, 1:26 PM

## 2021-05-15 NOTE — Patient Instructions (Addendum)
Thank you for seeing [place] in clinic today! Today we discussed:  High blood pressure: We will start a new medication called chlorthalidone for blood pressure. You can start taking this medication today, and stop taking the furosemide. Contact me if your leg swelling worsens. Your suboxone has been refilled. Continue taking as prescribed. Neuropathic pain: it is encouraging to see this continues to improve. We will check in about this during your next visit. Your seroquel has been refilled. Thoracic aortic aneurysm: continue to follow-up with the cardiac surgery office. Abstain from any intensive physical activity, but light yard work is okay. You will receive a flu vaccine and PCV20 shot today. You will receive a stool FIT test to send in.  Medication changes: - Stop taking your furosemide 40mg . Contact me if you notice that your swelling has worsened. - Start taking chlorthalidone 25mg  daily, in the morning.

## 2021-05-15 NOTE — Assessment & Plan Note (Signed)
He continues to take this, though has not administered it within the past two weeks. He still has doses at home and plans to continue taking it.  Plan: - Continue testosterone cypionate 400mg , q14

## 2021-05-16 ENCOUNTER — Other Ambulatory Visit (HOSPITAL_COMMUNITY): Payer: Self-pay

## 2021-05-18 ENCOUNTER — Other Ambulatory Visit: Payer: Medicare HMO

## 2021-05-18 DIAGNOSIS — Z1211 Encounter for screening for malignant neoplasm of colon: Secondary | ICD-10-CM

## 2021-05-20 LAB — TOXASSURE SELECT,+ANTIDEPR,UR

## 2021-05-20 LAB — FECAL OCCULT BLOOD, IMMUNOCHEMICAL: Fecal Occult Bld: NEGATIVE

## 2021-05-23 ENCOUNTER — Telehealth: Payer: Self-pay | Admitting: Student in an Organized Health Care Education/Training Program

## 2021-05-23 NOTE — Telephone Encounter (Signed)
-----   Message from [name], MD sent at 05/22/2021  3:48 PM EST ----- Regarding: Need appointment Hello,  Can you add Yovanni to my schedule for 11/28. It is ok to double book one of my early slots. Please let patient note what time to confirm.   Thank you.  Para March

## 2021-05-23 NOTE — Telephone Encounter (Signed)
Please refer to message below. Just spoke with patient and he is aware the he has an upcoming appointment with Dr. Oswaldo Done on 06/05/2021 at 9:45 am.  Appointment card mailed also.

## 2021-05-31 ENCOUNTER — Other Ambulatory Visit (HOSPITAL_COMMUNITY): Payer: Self-pay

## 2021-05-31 ENCOUNTER — Other Ambulatory Visit: Payer: Self-pay | Admitting: Student in an Organized Health Care Education/Training Program

## 2021-06-02 ENCOUNTER — Other Ambulatory Visit (HOSPITAL_COMMUNITY): Payer: Self-pay

## 2021-06-02 ENCOUNTER — Other Ambulatory Visit: Payer: Self-pay | Admitting: Student in an Organized Health Care Education/Training Program

## 2021-06-05 ENCOUNTER — Other Ambulatory Visit (HOSPITAL_COMMUNITY): Payer: Self-pay

## 2021-06-05 ENCOUNTER — Encounter: Payer: Medicare HMO | Admitting: Student in an Organized Health Care Education/Training Program

## 2021-06-05 MED ORDER — TESTOSTERONE CYPIONATE 200 MG/ML IM SOLN
400.0000 mg | INTRAMUSCULAR | 1 refills | Status: DC
  Filled 2021-06-05: qty 4, 28d supply, fill #0
  Filled 2021-07-04: qty 4, 28d supply, fill #1

## 2021-06-06 ENCOUNTER — Encounter: Payer: Self-pay | Admitting: *Deleted

## 2021-06-07 NOTE — Progress Notes (Deleted)
Cardiology Office Note:    Date:  06/07/2021   ID:  Joseph Ayala, DOB 1966-04-21, MRN [id number]  PCP:  Joseph Alias, MD  Cardiologist:  [name], MD  Electrophysiologist:  None   Referring MD: Joseph Ayala   No chief complaint on file.   History of Present Illness:    Joseph Ayala is a 55 y.o. male with a hx of hypertension, necrotizing myopathy, opioid use disorder on Suboxone, alcohol abuse,  He was admitted on 08/27/2020 with shortness of breath.  Found to be hypoxic, chest x-ray showed right lower lobe pneumonia.  CTPA showed no PE but dense area of consolidation and groundglass opacity in right middle and lower lobes.  Developed worsening hypoxia requiring intubation on 2/20.  Was found to have septic shock requiring pressor support.  Respiratory cultures grew MRSA.  Hospital course was complicated by atrial fibrillation, which is a new diagnosis.  Echocardiogram on 2/22 showed EF 35 to 40%, mild RV dysfunction.  He was started on amiodarone and converted to normal sinus rhythm on 2/22.  He required chest tube placement for right pleural effusion.  He was admitted from 2/19 through 09/29/2020.  He required tracheostomy but was removed on 3/22 he was discharged on oral linezolid and MRSA pneumonia.  He was discharged on metoprolol, Lasix, and lisinopril.  TEE on 09/16/2020 showed no evidence of endocarditis.  Likely small PFO.  EF 45 to 50%, low normal RV function.  Also noted on CT to have 4.6 cm thoracic aortic aneurysm.  Echocardiogram 12/08/2020 showed EF 55 to 60%.  Cardiac monitor worn for 13 days in 12/2020, no significant arrhythmias seen.  Since last clinic visit,  Today, he is accompanied by his wife, who also provides some history. Overall, he is doing well today. Since his hospital stay he has developed peripheral neuropathy. He will regularly wake up at 3-4 in the morning with terrible pain in his hands. Rarely, he also wakes up needing to  "gasp for air" at night. He describes this as feeling like he does not have enough oxygen, but does not consider this to be shortness of breath. He denies any chest pains. Twice a week he has physical therapists visit his home. He states that he is usually on his feet at least 8-10 hours every day. However, if he over-exerts too much, he is fatigued. He will feel lightheaded while bending over or lifting heavy objects, but denies any syncope. He noticed some LE edema yesterday, and remains compliant with his Lasix taken in the morning. While in the hospital, he lost 30 pounds. Lately, his weight is stable and he has been gaining back some appropriate weight. He has been doing well avoiding salt in his diet. He denies any hematuria or other bleeding issues. He reports stopping alcohol, nicotine, and marijuana use. His brother recently had heart failure and will receive a defibrillator.  Past Medical History:  Diagnosis Date   Acute HFmrEF (heart failure with mildly reduced ejection fraction) (HCC) 10/31/2020   Acute hypoxemic respiratory failure (HCC)    Acute respiratory failure with hypoxia (HCC) 08/28/2020   CAP (community acquired pneumonia) 08/28/2020   Degenerative disc disease, lumbar    Dental crown present    ETOH abuse    History of kidney stones    Hypertension    states under control with meds., has been on med. x 1 yr. - is currently out of med., to see PCP 02/06/2018   Marijuana use,  continuous    MRSA pneumonia ([place]) 09/09/2020   Muscle atrophy 01/2018   Muscle weakness 01/2018   Necrotizing myopathy    Necrotizing pneumonia ([place]) 09/09/2020   Neuropathic pain    Opioid use disorder    PAF (paroxysmal atrial fibrillation) (HCC)    Pleural effusion    Sepsis ([place]) 08/28/2020   Thoracic aortic aneurysm (TAA)    a. seen on CT 08/2020.    Past Surgical History:  Procedure Laterality Date   ENDOVENOUS ABLATION SAPHENOUS VEIN W/ LASER Right 12-29-2015   endovenous laser ablation right  greater saphenous vein, stab phlebectomy > 20 incisions right leg, sclerotherapy right leg by Curt Jews MD     MINOR MUSCLE BIOPSY Right 02/11/2018   Procedure: RECTUS FEMORIS MUSCLE BIOPSY;  Surgeon: Armandina Gemma, MD;  Location: [place];  Service: General;  Laterality: Right;   MUSCLE BIOPSY Right 02/11/2018   Procedure: DELTOID MUSCLE BIOPSY;  Surgeon: Armandina Gemma, MD;  Location: [place];  Service: General;  Laterality: Right;   NASAL FRACTURE SURGERY     TRACHEOSTOMY TUBE PLACEMENT N/A 09/14/2020   Procedure: TRACHEOSTOMY;  Surgeon: Melissa Montane, MD;  Location: [facility];  Service: ENT;  Laterality: N/A;    Current Medications: No outpatient medications have been marked as taking for the 06/08/21 encounter (Appointment) with Donato Heinz, MD.     Allergies:   Lyrica cr [pregabalin er], Vicodin [hydrocodone-acetaminophen], and Quinolones   Social History   Socioeconomic History   Marital status: Married    Spouse name: Not on file   Number of children: 1   Years of education: 13   Highest education level: Some college, no degree  Occupational History   Occupation: not working  Tobacco Use   Smoking status: Never   Smokeless tobacco: Former  Scientific laboratory technician Use: Never used  Substance and Sexual Activity   Alcohol use: Yes    Comment: occasionally   Drug use: No   Sexual activity: Yes    Partners: Female  Other Topics Concern   Not on file  Social History Narrative   Lives with wife and daughter in a one story home.  Has one child.  Currently not working.  Education: some college.    Social Determinants of Health   Financial Resource Strain: Low Risk    Difficulty of Paying Living Expenses: Not hard at all  Food Insecurity: No Food Insecurity   Worried About Charity fundraiser in the Last Year: Never true   Arboriculturist in the Last Year: Never true  Transportation Needs: Unmet Transportation Needs   Lack of Transportation  (Medical): No   Lack of Transportation (Non-Medical): Yes  Physical Activity: Inactive   Days of Exercise per Week: 0 days   Minutes of Exercise per Session: 0 min  Stress: Not on file  Social Connections: Not on file     Family History: The patient's family history includes Drug abuse in his brother; Varicose Veins in his brother.  ROS:   Please see the history of present illness.    (+) Peripheral neuropathy (+) Bilateral hand pain, severe (+) Lightheadedness (+) LE edema (+) Appropriate weight gain All other systems reviewed and are negative.  EKGs/Labs/Other Studies Reviewed:    The following studies were reviewed today:   EKG:   11/10/2020: NSR. Rate 89 bpm. LVH.  Echo 08/30/20: 1. Left ventricular ejection fraction, by estimation, is 35 to 40%. The  left  ventricle has moderately decreased function. The left ventricle  demonstrates global hypokinesis. The left ventricular internal cavity size  was moderately dilated. Left  ventricular diastolic parameters are indeterminate.   2. Right ventricular systolic function is mildly reduced. The right  ventricular size is moderately enlarged. There is mildly elevated  pulmonary artery systolic pressure.   3. Left atrial size was mildly dilated.   4. Right atrial size was moderately dilated.   5. The mitral valve is normal in structure. Mild mitral valve  regurgitation. No evidence of mitral stenosis.   6. The aortic valve has an indeterminant number of cusps. There is mild  calcification of the aortic valve. Aortic valve regurgitation is not  visualized. Mild aortic valve sclerosis is present, with no evidence of  aortic valve stenosis.   7. Aortic dilatation noted. There is mild to moderate dilatation of the  ascending aorta, measuring 44 mm.   8. The inferior vena cava is dilated in size with <50% respiratory  variability, suggesting right atrial pressure of 15 mmHg.   Lower Venous DVT Study 08/30/20: RIGHT:  - There is  no evidence of deep vein thrombosis in the lower extremity.     - No cystic structure found in the popliteal fossa.  - Triphasic waveforms are noted in the posterior tibial, peroneal, and  anterior tibial arteries.     LEFT:  - There is no evidence of deep vein thrombosis in the lower extremity.     - No cystic structure found in the popliteal fossa.  - Triphasic waveforms are noted in the posterior tibial, peroneal, and  anterior tibial arteries.   TEE 09/16/20:  1. Left ventricular ejection fraction, by estimation, is 45 to 50%. The  left ventricle has mildly decreased function.   2. Right ventricular systolic function is low normal. The right  ventricular size is normal.   3. No left atrial/left atrial appendage thrombus was detected.   4. The mitral valve is normal in structure. Trivial mitral valve  regurgitation. No evidence of mitral stenosis.   5. The aortic valve is tricuspid. Aortic valve regurgitation is not  visualized. No aortic stenosis is present.   6. Evidence of atrial level shunting detected by color flow Doppler.  There is a small patent foramen ovale with predominantly left to right  shunting across the atrial septum.  LOWER EXTREMITY DOPPLER STUDY 10/12/20: Right: Resting right ankle-brachial index indicates noncompressible right  lower extremity arteries.   Waveforms are strong triphasic.  The right toe-brachial index is normal.   Left: Resting left ankle-brachial index indicates noncompressible left  lower extremity arteries.   Waveforms are strong triphasic.  The left toe-brachial index is normal.  Recent Labs: 09/02/2020: TSH 3.379 11/10/2020: ALT 48; BNP 28.8; BUN 14; Creatinine, Ser 0.61; Hemoglobin 12.5; Magnesium 2.1; Platelets 261; Potassium 5.1; Sodium 141  Recent Lipid Panel    Component Value Date/Time   CHOL 202 (H) 09/01/2019 1053   TRIG 184 (H) 09/14/2020 0630   HDL 61 09/01/2019 1053   CHOLHDL 3.3 09/01/2019 1053   LDLCALC 111 (H)  09/01/2019 1053    Physical Exam:    VS:  There were no vitals taken for this visit.    Wt Readings from Last 3 Encounters:  05/15/21 210 lb 11.2 oz (95.6 kg)  05/01/21 210 lb (95.3 kg)  11/10/20 200 lb 6.4 oz (90.9 kg)     GEN: Well nourished, well developed in no acute distress HEENT: Normal NECK: No JVD; No carotid  bruits LYMPHATICS: No lymphadenopathy CARDIAC: RRR, no murmurs, rubs, gallops RESPIRATORY:  Clear to auscultation without rales, wheezing or rhonchi  ABDOMEN: Soft, non-tender, non-distended MUSCULOSKELETAL:  No edema; No deformity  SKIN: Warm and dry NEUROLOGIC:  Alert and oriented x 3 PSYCHIATRIC:  Normal affect   ASSESSMENT:    No diagnosis found.  PLAN:    Atrial fibrillation: Occurred in setting of acute illness.  Started on Eliquis 5 mg twice daily.  Suspect A. fib was due to critical illness and will likely not need long-term anticoagulation.  Cardiac monitor worn for 13 days in 12/2020, no significant arrhythmias seen.  Eliquis discontinued   Acute combined systolic and diastolic heart failure: EF 35 to 40% 2/20.  TEE 3/11 showed improvement in EF to 45 to 50%.  Echocardiogram 12/08/2020 showed EF 55 to 60%.  Suspect related to acute illness and resolved with recovery  Thoracic aortic aneurysm: Measured 4.6 cm.  Follows with thoracic surgery, repeat CT chest in 04/2021 showed aneurysm measured 4.4 cm   RTC in***  Medication Adjustments/Labs and Tests Ordered: Current medicines are reviewed at length with the patient today.  Concerns regarding medicines are outlined above.  No orders of the defined types were placed in this encounter.  No orders of the defined types were placed in this encounter.   There are no Patient Instructions on file for this visit.     Signed, Donato Heinz, MD  06/07/2021 9:48 PM    [place]

## 2021-06-08 ENCOUNTER — Ambulatory Visit: Payer: Medicare HMO | Admitting: Cardiology

## 2021-06-19 ENCOUNTER — Other Ambulatory Visit (HOSPITAL_COMMUNITY): Payer: Self-pay

## 2021-06-19 ENCOUNTER — Other Ambulatory Visit: Payer: Self-pay

## 2021-06-19 MED ORDER — SILDENAFIL CITRATE 25 MG PO TABS
25.0000 mg | ORAL_TABLET | ORAL | 1 refills | Status: DC | PRN
Start: 2021-06-19 — End: 2022-06-20
  Filled 2021-06-19: qty 20, 20d supply, fill #0
  Filled 2021-10-02: qty 20, 20d supply, fill #1

## 2021-06-20 ENCOUNTER — Other Ambulatory Visit (HOSPITAL_COMMUNITY): Payer: Self-pay

## 2021-06-27 ENCOUNTER — Other Ambulatory Visit (HOSPITAL_COMMUNITY): Payer: Self-pay

## 2021-07-04 ENCOUNTER — Other Ambulatory Visit (HOSPITAL_COMMUNITY): Payer: Self-pay

## 2021-07-07 ENCOUNTER — Telehealth: Payer: Self-pay | Admitting: Internal Medicine

## 2021-07-07 ENCOUNTER — Other Ambulatory Visit (HOSPITAL_COMMUNITY): Payer: Self-pay

## 2021-07-24 ENCOUNTER — Other Ambulatory Visit: Payer: Self-pay | Admitting: Student in an Organized Health Care Education/Training Program

## 2021-07-24 ENCOUNTER — Encounter: Payer: Medicare HMO | Admitting: Student in an Organized Health Care Education/Training Program

## 2021-07-24 ENCOUNTER — Other Ambulatory Visit (HOSPITAL_COMMUNITY): Payer: Self-pay

## 2021-07-26 ENCOUNTER — Other Ambulatory Visit (HOSPITAL_COMMUNITY): Payer: Self-pay

## 2021-07-26 MED ORDER — TESTOSTERONE CYPIONATE 200 MG/ML IM SOLN
400.0000 mg | INTRAMUSCULAR | 1 refills | Status: DC
Start: 2021-07-26 — End: 2021-09-20
  Filled 2021-07-26 – 2021-08-02 (×2): qty 4, 28d supply, fill #0
  Filled 2021-09-01: qty 4, 28d supply, fill #1

## 2021-07-31 ENCOUNTER — Encounter: Payer: Medicare HMO | Admitting: Student in an Organized Health Care Education/Training Program

## 2021-08-02 ENCOUNTER — Other Ambulatory Visit: Payer: Self-pay | Admitting: Student in an Organized Health Care Education/Training Program

## 2021-08-02 ENCOUNTER — Other Ambulatory Visit (HOSPITAL_COMMUNITY): Payer: Self-pay

## 2021-08-03 ENCOUNTER — Other Ambulatory Visit (HOSPITAL_COMMUNITY): Payer: Self-pay

## 2021-08-03 MED ORDER — BUPRENORPHINE HCL-NALOXONE HCL 8-2 MG SL FILM
1.0000 | ORAL_FILM | Freq: Two times a day (BID) | SUBLINGUAL | 0 refills | Status: DC
Start: 2021-08-03 — End: 2021-08-21
  Filled 2021-08-03: qty 60, 30d supply, fill #0
  Filled 2021-08-04: qty 28, 14d supply, fill #0

## 2021-08-04 ENCOUNTER — Other Ambulatory Visit (HOSPITAL_COMMUNITY): Payer: Self-pay

## 2021-08-18 ENCOUNTER — Other Ambulatory Visit (HOSPITAL_COMMUNITY): Payer: Self-pay

## 2021-08-18 ENCOUNTER — Other Ambulatory Visit: Payer: Self-pay | Admitting: Student in an Organized Health Care Education/Training Program

## 2021-08-21 ENCOUNTER — Other Ambulatory Visit (HOSPITAL_COMMUNITY): Payer: Self-pay

## 2021-08-21 ENCOUNTER — Ambulatory Visit (INDEPENDENT_AMBULATORY_CARE_PROVIDER_SITE_OTHER): Payer: Medicare HMO | Admitting: Student in an Organized Health Care Education/Training Program

## 2021-08-21 ENCOUNTER — Encounter: Payer: Self-pay | Admitting: Student in an Organized Health Care Education/Training Program

## 2021-08-21 VITALS — BP 125/83 | HR 85 | Temp 97.6°F | Ht 71.0 in | Wt 216.6 lb

## 2021-08-21 DIAGNOSIS — I712 Thoracic aortic aneurysm, without rupture, unspecified: Secondary | ICD-10-CM

## 2021-08-21 DIAGNOSIS — E782 Mixed hyperlipidemia: Secondary | ICD-10-CM | POA: Diagnosis not present

## 2021-08-21 DIAGNOSIS — G7289 Other specified myopathies: Secondary | ICD-10-CM | POA: Diagnosis not present

## 2021-08-21 DIAGNOSIS — F1190 Opioid use, unspecified, uncomplicated: Secondary | ICD-10-CM

## 2021-08-21 DIAGNOSIS — E785 Hyperlipidemia, unspecified: Secondary | ICD-10-CM | POA: Insufficient documentation

## 2021-08-21 DIAGNOSIS — I10 Essential (primary) hypertension: Secondary | ICD-10-CM

## 2021-08-21 DIAGNOSIS — R7989 Other specified abnormal findings of blood chemistry: Secondary | ICD-10-CM | POA: Diagnosis not present

## 2021-08-21 MED ORDER — BUPRENORPHINE HCL-NALOXONE HCL 8-2 MG SL FILM
1.0000 | ORAL_FILM | Freq: Two times a day (BID) | SUBLINGUAL | 0 refills | Status: DC
  Filled 2021-08-21: qty 60, 30d supply, fill #0

## 2021-08-21 MED ORDER — METOPROLOL SUCCINATE ER 50 MG PO TB24
50.0000 mg | ORAL_TABLET | Freq: Every day | ORAL | 3 refills | Status: DC
Start: 2021-08-21 — End: 2022-07-13
  Filled 2021-08-21: qty 90, 90d supply, fill #0
  Filled 2021-11-23: qty 90, 90d supply, fill #1
  Filled 2022-02-28: qty 90, 90d supply, fill #2
  Filled 2022-03-14: qty 90, 90d supply, fill #3

## 2021-08-21 MED ORDER — CARESTART COVID-19 HOME TEST VI KIT
PACK | 0 refills | Status: DC
  Filled 2021-08-21: qty 4, 4d supply, fill #0

## 2021-08-21 NOTE — Patient Instructions (Signed)
Today we addressed several issues:  You can stop taking Seroquel as it probably has low benefit at this point. Call me if you have trouble with your mood in the future.  You do not need to take Lasix. For the swelling in your legs, I recommend using compression stockings during the daytime.  I will switch your metoprolol to a long acting version which you can take just once daily.  Continue chlorthalidone once daily for your blood pressure.  I will check blood work today and will send you the results on mychart.  We will plan to repeat the CT of your chest in October.

## 2021-08-21 NOTE — Progress Notes (Signed)
° °  Assessment and Plan:  See Encounters tab for problem-based medical decision making.   __________________________________________________________  HPI:   56 year old person here for office-based therapy of opioid use disorder.  Patient reports doing fairly well the last few months.  Denies any recent illness, no fevers or chills.  Reports good exertional capacity, no chest pain or shortness of breath.  Reports his functional state has returned to normal.  We went over his medications and clarified a few areas of confusion.  He is noted some slight swelling in his lower extremities and was taking some leftover Lasix from prior hospitalization.  He was hospitalized last year for necrotizing pneumonia due to MRSA, almost died during that hospitalization but has recovered nicely.  __________________________________________________________  Problem List: Patient Active Problem List   Diagnosis Date Noted   Opioid use disorder 09/11/2018    Priority: High   Necrotizing myopathy 12/27/2017    Priority: High   Hypertension 07/13/2019    Priority: Medium    Chronic pain syndrome 10/22/2017    Priority: Medium    Degeneration of lumbar intervertebral disc 09/25/2017    Priority: Medium    Lumbar spondylosis 09/25/2017    Priority: Medium    Hyperlipidemia 08/21/2021    Priority: Low   Healthcare maintenance 05/15/2021    Priority: Low   Erectile dysfunction 10/31/2020    Priority: Low   Thoracic aortic aneurysm without rupture     Priority: Low   ADHD 06/29/2019    Priority: Low   Low testosterone 06/01/2019    Priority: Low    Medications: Reconciled today in Epic __________________________________________________________  Physical Exam:  Vital Signs: Vitals:   08/21/21 0950  BP: 125/83  Pulse: 85  Temp: 97.6 F (36.4 C)  TempSrc: Oral  SpO2: 97%  Weight: 216 lb 9.6 oz (98.2 kg)  Height: 5\' 11"  (1.803 m)    Gen: Well appearing, NAD Neck: No cervical LAD, No  thyromegaly or nodules, well-healed scar from prior tracheostomy CV: RRR, no murmurs Pulm: Normal effort, CTA throughout, no wheezing Ext: Warm, no edema today, normal joints

## 2021-08-21 NOTE — Assessment & Plan Note (Signed)
History of mildly dilated thoracic aorta to 4.4 cm incidentally found on CT chest.  Will need to repeat this CT in October of this year.  Currently asymptomatic and doing well, good blood pressure control.  Has not had good adherence with metoprolol, I am going to switch from the tartrate to the succinate formulation to hopefully improve adherence.

## 2021-08-21 NOTE — Assessment & Plan Note (Signed)
History of hyperlipidemia about 2 years ago.  Patient not able to use statin medications because of necrotizing myopathy.  Will recheck lipids today, if ASCVD risk is greater than 10% may add ezetimibe.

## 2021-08-21 NOTE — Assessment & Plan Note (Signed)
History of low testosterone levels on supplementation for many years.  We have struggled to check his testosterone levels at the 1 week mark after his injections.  He has some inconsistencies with how he takes his medications, does not like doing the injections, has had a few issues with bruising and bleeding from the medications.  We talked about the natural course of testosterone levels as men get older.  Also talked about the potential benefits of supplementation, I am not sure he is getting much benefit at this time.  Its been about 3 weeks since he last took testosterone.  We decided to recheck a morning testosterone today.  If it is still low I may recommend to him pausing testosterone supplementation for period of time while we work on other medical priorities.

## 2021-08-21 NOTE — Assessment & Plan Note (Signed)
Blood pressure well controlled after addition of chlorthalidone.  We will plan to continue this medication and check BMP today.

## 2021-08-21 NOTE — Assessment & Plan Note (Signed)
Patient with moderate-severe opioid use disorder on treatment with Suboxone for several years.  Last urine toxicology was high risk with polysubstances including fentanyl and absence of Suboxone.  Patient more forthcoming today in our discussion, tells me that he has been using heroin intermittently for the last several months to year.  He snorts the powder, does not inject.  He says he does not have cravings, does not seek out drugs, rather these are impulsive decisions and usually associated with certain friend groups.  He does now have better insight into how inhaling drugs likely contributed to the necrotizing pneumonia he suffered last year.  Says he has been adherent with Suboxone the last 3 months, last used other drugs about 11 days ago.  We talked about the risk of overdose, talked about all the components that were on his tox assure and he was surprised to hear about all the additives.  We talked about the addition of counseling or Narcotics Anonymous which she is going to look into.  We will plan to continue with Suboxone, I refilled for 8 mg twice daily 1 month supply.  We will check urine tox assure today to confirm adherence with Suboxone.

## 2021-08-21 NOTE — Assessment & Plan Note (Signed)
History of necrotizing myopathy but has not been on treatment since hospitalization about 1 year ago.  Functionally he has been doing better this last year with less myalgia, not function limiting.  We will check CK today.  Encouraged follow-up with [facility] neurology which she had previously been referred to.

## 2021-08-22 LAB — CK: Total CK: 2882 U/L (ref 41–331)

## 2021-08-22 LAB — CMP14 + ANION GAP
ALT: 128 IU/L — ABNORMAL HIGH (ref 0–44)
AST: 113 IU/L — ABNORMAL HIGH (ref 0–40)
Albumin/Globulin Ratio: 2.6 — ABNORMAL HIGH (ref 1.2–2.2)
Albumin: 4.4 g/dL (ref 3.8–4.9)
Alkaline Phosphatase: 59 IU/L (ref 44–121)
Anion Gap: 13 mmol/L (ref 10.0–18.0)
BUN/Creatinine Ratio: 38 — ABNORMAL HIGH (ref 9–20)
BUN: 25 mg/dL — ABNORMAL HIGH (ref 6–24)
Bilirubin Total: 0.3 mg/dL (ref 0.0–1.2)
CO2: 28 mmol/L (ref 20–29)
Calcium: 9.8 mg/dL (ref 8.7–10.2)
Chloride: 98 mmol/L (ref 96–106)
Creatinine, Ser: 0.65 mg/dL — ABNORMAL LOW (ref 0.76–1.27)
Globulin, Total: 1.7 g/dL (ref 1.5–4.5)
Glucose: 84 mg/dL (ref 70–99)
Potassium: 3.6 mmol/L (ref 3.5–5.2)
Sodium: 139 mmol/L (ref 134–144)
Total Protein: 6.1 g/dL (ref 6.0–8.5)
eGFR: 112 mL/min/{1.73_m2} (ref >59)

## 2021-08-22 LAB — LIPID PANEL
Chol/HDL Ratio: 4.6 ratio (ref 0.0–5.0)
Cholesterol, Total: 260 mg/dL — ABNORMAL HIGH (ref 100–199)
HDL: 57 mg/dL (ref >39)
LDL Chol Calc (NIH): 163 mg/dL — ABNORMAL HIGH (ref 0–99)
Triglycerides: 217 mg/dL — ABNORMAL HIGH (ref 0–149)
VLDL Cholesterol Cal: 40 mg/dL (ref 5–40)

## 2021-08-22 LAB — TESTOSTERONE: Testosterone: <3 ng/dL — ABNORMAL LOW (ref 264–916)

## 2021-08-24 ENCOUNTER — Encounter: Payer: Self-pay | Admitting: Student in an Organized Health Care Education/Training Program

## 2021-08-24 ENCOUNTER — Other Ambulatory Visit (HOSPITAL_COMMUNITY): Payer: Self-pay

## 2021-08-24 MED ORDER — EZETIMIBE 10 MG PO TABS
10.0000 mg | ORAL_TABLET | Freq: Every day | ORAL | 3 refills | Status: DC
  Filled 2021-08-24: qty 90, 90d supply, fill #0
  Filled 2021-11-23: qty 90, 90d supply, fill #1
  Filled 2022-02-28: qty 90, 90d supply, fill #2
  Filled 2022-03-14: qty 90, 90d supply, fill #3

## 2021-08-26 LAB — TOXASSURE SELECT,+ANTIDEPR,UR

## 2021-08-31 NOTE — Telephone Encounter (Signed)
Erroneous encounter

## 2021-09-01 ENCOUNTER — Other Ambulatory Visit (HOSPITAL_COMMUNITY): Payer: Self-pay

## 2021-09-20 ENCOUNTER — Other Ambulatory Visit: Payer: Self-pay

## 2021-09-20 ENCOUNTER — Other Ambulatory Visit (HOSPITAL_COMMUNITY): Payer: Self-pay

## 2021-09-20 ENCOUNTER — Ambulatory Visit (INDEPENDENT_AMBULATORY_CARE_PROVIDER_SITE_OTHER): Payer: Medicare HMO | Admitting: Internal Medicine

## 2021-09-20 ENCOUNTER — Other Ambulatory Visit: Payer: Self-pay | Admitting: Student in an Organized Health Care Education/Training Program

## 2021-09-20 ENCOUNTER — Encounter: Payer: Self-pay | Admitting: Internal Medicine

## 2021-09-20 VITALS — BP 110/74 | HR 93 | Temp 97.8°F | Ht 71.0 in | Wt 221.1 lb

## 2021-09-20 DIAGNOSIS — M7989 Other specified soft tissue disorders: Secondary | ICD-10-CM

## 2021-09-20 DIAGNOSIS — R600 Localized edema: Secondary | ICD-10-CM

## 2021-09-20 LAB — BRAIN NATRIURETIC PEPTIDE: B Natriuretic Peptide: 6.7 pg/mL (ref 0.0–100.0)

## 2021-09-20 MED ORDER — FUROSEMIDE 40 MG PO TABS
40.0000 mg | ORAL_TABLET | Freq: Every day | ORAL | 11 refills | Status: DC
  Filled 2021-09-20: qty 30, 30d supply, fill #0

## 2021-09-20 NOTE — Patient Instructions (Signed)
Dear Mr. Boullion, ? ?Thank you for trusting [place] with your care today. ? ?We  discussed your leg swelling and redness. ? ?For this, we would like to restart your lasix. We will have you return to the clinic in 2-3  weeks to follow up on this.  ? ?If you develop chest pain, worsening shortness of breath, please go to the emergency department.  ? ? ?

## 2021-09-20 NOTE — Progress Notes (Signed)
? ?  CC: red painful leg ? ?HPI:[name] is a 56 y.o. male who presents for evaluation of red painful leg. Please see individual problem based A/P for details. ? ?Depression, PHQ-9: ?Based on the patients  ?Flowsheet Row Office Visit from 10/12/2020 in [facility]  ?PHQ-9 Total Score 0  ? ?  ? score we have 0. ? ?Past Medical History:  ?Diagnosis Date  ? Acute HFmrEF (heart failure with mildly reduced ejection fraction) (HCC) 10/31/2020  ? Acute hypoxemic respiratory failure (HCC)   ? Acute respiratory failure with hypoxia (HCC) 08/28/2020  ? CAP (community acquired pneumonia) 08/28/2020  ? Degenerative disc disease, lumbar   ? Dental crown present   ? ETOH abuse   ? History of kidney stones   ? Hypertension   ? states under control with meds., has been on med. x 1 yr. - is currently out of med., to see PCP 02/06/2018  ? Marijuana use, continuous   ? MRSA pneumonia (HCC) 09/09/2020  ? Muscle atrophy 01/2018  ? Muscle weakness 01/2018  ? Necrotizing myopathy   ? Necrotizing pneumonia (HCC) 09/09/2020  ? Neuropathic pain   ? Opioid use disorder   ? PAF (paroxysmal atrial fibrillation) (HCC)   ? Pleural effusion   ? Sepsis (HCC) 08/28/2020  ? Thoracic aortic aneurysm (TAA)   ? a. seen on CT 08/2020.  ? ?Review of Systems:   ?Review of Systems  ?Constitutional:  Negative for chills, fever and weight loss.  ?Respiratory:  Positive for shortness of breath. Negative for cough, sputum production and wheezing.   ?Cardiovascular:  Positive for palpitations, orthopnea, leg swelling and PND. Negative for chest pain and claudication.  ?Gastrointestinal: Negative.   ?Musculoskeletal: Negative.   ?Skin:  Positive for rash.  ?Neurological: Negative.    ? ?Physical Exam: ?Vitals:  ? 09/20/21 1049  ?BP: 110/74  ?Pulse: 93  ?Temp: 97.8 ?F (36.6 ?C)  ?TempSrc: Oral  ?SpO2: 97%  ?Weight: 221 lb 1.6 oz (100.3 kg)  ?Height: 5\' 11"  (1.803 m)  ? ? ? ?General: alert and oriented, no acute distress ?HEENT: Conjunctiva nl ,  antiicteric sclerae, moist mucous membranes, no exudate or erythema. JVD to mandible ?Cardiovascular: Normal rate, regular rhythm.  No murmurs, rubs, or gallops ?Pulmonary : Equal breath sounds, No wheezes, rales, or rhonchi ?Abdominal: soft, nontender,  bowel sounds present ?Ext: bilateral 1+ pitting edema to mid shin. Distended veins left leg. ?Skin: bilateral erythema of lower legs ? ?Assessment & Plan:  ? ?See Encounters Tab for problem based charting. ? ?Patient discussed with Dr.  ? ?

## 2021-09-21 ENCOUNTER — Other Ambulatory Visit (HOSPITAL_COMMUNITY): Payer: Self-pay

## 2021-09-21 ENCOUNTER — Encounter: Payer: Self-pay | Admitting: Internal Medicine

## 2021-09-21 DIAGNOSIS — R600 Localized edema: Secondary | ICD-10-CM | POA: Insufficient documentation

## 2021-09-21 MED ORDER — BUPRENORPHINE HCL-NALOXONE HCL 8-2 MG SL FILM
1.0000 | ORAL_FILM | Freq: Two times a day (BID) | SUBLINGUAL | 0 refills | Status: DC
  Filled 2021-09-21: qty 60, 30d supply, fill #0

## 2021-09-21 MED ORDER — TESTOSTERONE CYPIONATE 200 MG/ML IM SOLN
400.0000 mg | INTRAMUSCULAR | 1 refills | Status: DC
Start: 2021-09-21 — End: 2021-11-29
  Filled 2021-09-21 – 2021-09-29 (×3): qty 4, 28d supply, fill #0
  Filled 2021-10-30: qty 4, 28d supply, fill #1

## 2021-09-21 NOTE — Assessment & Plan Note (Signed)
Patient presented with complaints of bilateral lower extremity swelling with redness and pain for approximately 3-4 days. He does endorse orthopnea, needing additional pillows to sleep comfortably at night, as well as worsening DOE. Denies chest pain. He does report brief self limited episode of chest palpitations day prior to OV with  Lightheadedness while walking to the kitchen. No fevers. He has a history of myositis, but reports that current symptoms do not feel similar (usually experiences muscle fatigue in thighs).  ?He has had 5lb weight gain over the past month.  ?Bilateral 1+ pitting edema to mid-upper shin with skin erythema. Leg veins are noted to be more distended on left (patient reports hx of vein ablation left leg), JVD noted to angle of mandible. Absent lung crackles. Blood pressure 110/74. ?Began therapy for presumed CHF exacerbation. He has been on Lasix 40mg  in the past. He was given a prescription for this. BNP was also obtained and ECHO ordered. BNP returned  Normal at 6.7. Although he is obese, would not expect BNP to be this substantially decreased.  Called patient to discuss lab result. He reports that he has taken one dose of lasix and does note improvement in swelling and breathing. Advised patient to hold lasix for now, given normal BNP. Will elevate legs and use compression stockings and plan to reevaluate the patient in 1 week. ?

## 2021-09-22 NOTE — Progress Notes (Signed)
Internal Medicine Clinic Attending ? ?Case discussed with Dr. Burnice Logan  At the time of the visit.  We reviewed the resident?s history and exam and pertinent patient test results.  I agree with the assessment, diagnosis, and plan of care documented in the resident?s note. History of reduced EF with LV global hypokinesis in 08/2020 while hospitalized for necrotizing pneumonia; cardiology felt reduced EF was d/t stress vs alcohol at that time. Repeat TTE 12/2020 with recovered EF, mild LVH, normal diastolic parameters. History of intermittent LEE noted at past clinic visits, discontinued Lasix and started chlorthalidone for BP control at last visit. Today noting increasing LEE, orthopnea, and DOE--discussed BNP and repeat TTE given clinical s/sx of HF and mild LVH. Will plan to hold on further Lasix if BNP is not significantly elevated and have patient return for f/u evaluation. Discussed leg elevation and compression therapy. ? ?

## 2021-09-27 ENCOUNTER — Other Ambulatory Visit (HOSPITAL_COMMUNITY): Payer: Self-pay

## 2021-09-29 ENCOUNTER — Other Ambulatory Visit (HOSPITAL_COMMUNITY): Payer: Self-pay

## 2021-10-02 ENCOUNTER — Other Ambulatory Visit (HOSPITAL_COMMUNITY): Payer: Self-pay

## 2021-10-02 ENCOUNTER — Encounter: Payer: Self-pay | Admitting: Student in an Organized Health Care Education/Training Program

## 2021-10-02 ENCOUNTER — Ambulatory Visit (INDEPENDENT_AMBULATORY_CARE_PROVIDER_SITE_OTHER): Payer: Medicare HMO | Admitting: Student in an Organized Health Care Education/Training Program

## 2021-10-02 VITALS — BP 126/85 | HR 86 | Temp 98.0°F | Ht 71.0 in | Wt 211.8 lb

## 2021-10-02 DIAGNOSIS — R600 Localized edema: Secondary | ICD-10-CM | POA: Diagnosis not present

## 2021-10-02 DIAGNOSIS — R7989 Other specified abnormal findings of blood chemistry: Secondary | ICD-10-CM | POA: Diagnosis not present

## 2021-10-02 DIAGNOSIS — F1190 Opioid use, unspecified, uncomplicated: Secondary | ICD-10-CM

## 2021-10-02 MED ORDER — BUPRENORPHINE HCL-NALOXONE HCL 8-2 MG SL FILM
1.0000 | ORAL_FILM | Freq: Two times a day (BID) | SUBLINGUAL | 0 refills | Status: DC
  Filled 2021-10-02 – 2021-10-20 (×3): qty 60, 30d supply, fill #0

## 2021-10-02 NOTE — Assessment & Plan Note (Signed)
Testosterone levels at last visit were undetectable after being off supplements for a few months. I think we should continue with supplementation for now, he may be chronically suppressed or have complete gonadal failure at this point. Will restart testosterone supplements now, and he will come in for testosterone recheck 1 week after an injection, I have placed a future order for this. ?

## 2021-10-02 NOTE — Patient Instructions (Addendum)
Today we talked about several issues: ? ?For your leg swelling, I recommend you wear compression stockings during the day, take them off at night.  ?I don't think you need to take Lasix every day, I don't want you to become dehydrated. Instead, you can keep them around and only use if you notice a lot of swelling or weight gain which does not get better with the compression socks.  ?Come back in 3 weeks so we can check your testosterone. Make sure you come about 1 week after you dose the testosterone shot.  ?Please call Dr. Brunetta Genera office at [facility] to make an appointment to discuss future treatment of your myopathy.  ?

## 2021-10-02 NOTE — Progress Notes (Signed)
? ?  Assessment and Plan: ? ?See Encounters tab for problem-based medical decision making.  ? ?__________________________________________________________ ? ?HPI: ? ? ?56 year old person here for follow up of lower extremity swelling. He reports the swelling is resolved. He took Lasix daily for several days, then stopped with the swelling resolved. He denies dyspnea on exertion or PND. No chest pain. He reports good exertional capacity. Good adherence with other medications. No fevers or chills. We talked for a while about his recovery from his critical illness last year, still having mental fogginess and issues with attention, which I normalized for him.  ? ?__________________________________________________________ ? ?Problem List: ?Patient Active Problem List  ? Diagnosis Date Noted  ? Opioid use disorder 09/11/2018  ?  Priority: High  ? Necrotizing myopathy 12/27/2017  ?  Priority: High  ? Hypertension 07/13/2019  ?  Priority: Medium   ? Chronic pain syndrome 10/22/2017  ?  Priority: Medium   ? Degeneration of lumbar intervertebral disc 09/25/2017  ?  Priority: Medium   ? Lumbar spondylosis 09/25/2017  ?  Priority: Medium   ? Bilateral lower extremity edema 09/21/2021  ?  Priority: Low  ? Hyperlipidemia 08/21/2021  ?  Priority: Low  ? Healthcare maintenance 05/15/2021  ?  Priority: Low  ? Erectile dysfunction 10/31/2020  ?  Priority: Low  ? Thoracic aortic aneurysm without rupture   ?  Priority: Low  ? ADHD 06/29/2019  ?  Priority: Low  ? Low testosterone 06/01/2019  ?  Priority: Low  ? ? ?Medications: Reconciled today in Epic ?__________________________________________________________ ? ?Physical Exam:  ?Vital Signs: ?Vitals:  ? 10/02/21 0905  ?BP: 126/85  ?Pulse: 86  ?Temp: 98 ?F (36.7 ?C)  ?TempSrc: Oral  ?SpO2: 97%  ?Weight: 211 lb 12.8 oz (96.1 kg)  ?Height: 5\' 11"  (1.803 m)  ? ? ?Gen: Well appearing, NAD ?Neck: No cervical LAD, No thyromegaly or nodules, No JVD. ?CV: RRR, no murmurs ?Pulm: Normal effort, CTA  throughout, no wheezing  ?Ext: Warm, no edema, varicose veins on both legs, left worse than right ? ? ?

## 2021-10-02 NOTE — Assessment & Plan Note (Signed)
No signs of volume overload today, no edema either. He has varicose veins in both legs which I think is the likely cause of his intermittent edema. I advised treatment with compression stockings. I advised against lasix therapy. BNP was normal, he has no other signs or symptoms to suggest CHF. His history of low EF was in the setting of acute illness which is now resolved. ?

## 2021-10-02 NOTE — Assessment & Plan Note (Signed)
Reports much improved control, no relapses in several weeks, he has made many positive changes in his home life to be around people who use drugs less. There is some confusion about his dose, but we clarified that he should be able to take 2 films daily, he had a one month supply dispensed 2 weeks ago. Will continue with this dose, I sent another 4 weeks supply to the pharmacy. Will check toxassure today. ?

## 2021-10-03 ENCOUNTER — Other Ambulatory Visit (HOSPITAL_COMMUNITY): Payer: Self-pay

## 2021-10-04 ENCOUNTER — Encounter: Payer: Self-pay | Admitting: Student in an Organized Health Care Education/Training Program

## 2021-10-06 ENCOUNTER — Ambulatory Visit (INDEPENDENT_AMBULATORY_CARE_PROVIDER_SITE_OTHER): Payer: Medicare HMO | Admitting: Student in an Organized Health Care Education/Training Program

## 2021-10-06 DIAGNOSIS — Z0000 Encounter for general adult medical examination without abnormal findings: Secondary | ICD-10-CM

## 2021-10-06 NOTE — Progress Notes (Deleted)
? ?Subjective:  ? Joseph Ayala is a 56 y.o. male who presents for an Initial Medicare Annual Wellness Visit. ?I connected with  Joseph Ayala on 10/06/21 by a audio enabled telemedicine application and verified that I am speaking with the correct person using two identifiers. ? ?Patient Location: Home ? ?Provider Location: Office/Clinic ? ?I discussed the limitations of evaluation and management by telemedicine. The patient expressed understanding and agreed to proceed.  ?Review of Systems    ?Defer to PCP ?  ? ?   ?Objective:  ?  ?There were no vitals filed for this visit. ?There is no height or weight on file to calculate BMI. ? ? ?  10/02/2021  ?  9:08 AM 09/20/2021  ? 10:47 AM 08/21/2021  ?  9:48 AM 05/16/2021  ?  9:32 AM 10/12/2020  ?  3:14 PM 10/10/2020  ?  4:00 PM 09/16/2020  ?  8:00 AM  ?Advanced Directives  ?Does Patient Have a Medical Advance Directive? No No No No No No No  ?Would patient like information on creating a medical advance directive? No - Patient declined No - Patient declined No - Patient declined No - Patient declined No - Patient declined No - Patient declined No - Patient declined  ? ? ?Current Medications (verified) ?Outpatient Encounter Medications as of 10/06/2021  ?Medication Sig  ? Buprenorphine HCl-Naloxone HCl 8-2 MG FILM Place 1 Film under the tongue in the morning and at bedtime.  ? chlorthalidone (HYGROTON) 25 MG tablet Take 1 tablet (25 mg total) by mouth daily.  ? ezetimibe (ZETIA) 10 MG tablet Take 1 tablet (10 mg total) by mouth daily.  ? metoprolol succinate (TOPROL-XL) 50 MG 24 hr tablet Take 1 tablet (50 mg total) by mouth daily. Take with or immediately following a meal.  ? sildenafil (VIAGRA) 25 MG tablet Take 1 tablet (25 mg total) by mouth as needed for erectile dysfunction.  ? testosterone cypionate (DEPOTESTOSTERONE CYPIONATE) 200 MG/ML injection Inject 2 mLs (400 mg total) into the muscle every 14 (fourteen) days.  ? ?No facility-administered encounter medications on file  as of 10/06/2021.  ? ? ?Allergies (verified) ?Lyrica cr [pregabalin er], Vicodin [hydrocodone-acetaminophen], and Quinolones  ? ?History: ?Past Medical History:  ?Diagnosis Date  ? Acute HFmrEF (heart failure with mildly reduced ejection fraction) (HCC) 10/31/2020  ? Acute hypoxemic respiratory failure (HCC)   ? Acute respiratory failure with hypoxia (HCC) 08/28/2020  ? CAP (community acquired pneumonia) 08/28/2020  ? Degenerative disc disease, lumbar   ? Dental crown present   ? ETOH abuse   ? History of kidney stones   ? Hypertension   ? states under control with meds., has been on med. x 1 yr. - is currently out of med., to see PCP 02/06/2018  ? Marijuana use, continuous   ? MRSA pneumonia (HCC) 09/09/2020  ? Muscle atrophy 01/2018  ? Muscle weakness 01/2018  ? Necrotizing myopathy   ? Necrotizing pneumonia (HCC) 09/09/2020  ? Neuropathic pain   ? Opioid use disorder   ? PAF (paroxysmal atrial fibrillation) (HCC)   ? Pleural effusion   ? Sepsis (HCC) 08/28/2020  ? Thoracic aortic aneurysm (TAA)   ? a. seen on CT 08/2020.  ? ?Past Surgical History:  ?Procedure Laterality Date  ? ENDOVENOUS ABLATION SAPHENOUS VEIN W/ LASER Right 12-29-2015  ? endovenous laser ablation right greater saphenous vein, stab phlebectomy > 20 incisions right leg, sclerotherapy right leg by Gretta Began MD    ? MINOR MUSCLE  BIOPSY Right 02/11/2018  ? Procedure: RECTUS FEMORIS MUSCLE BIOPSY;  Surgeon: Darnell LevelGerkin, Todd, MD;  Location: [facility];  Service: General;  Laterality: Right;  ? MUSCLE BIOPSY Right 02/11/2018  ? Procedure: DELTOID MUSCLE BIOPSY;  Surgeon: Darnell LevelGerkin, Todd, MD;  Location: [facility];  Service: General;  Laterality: Right;  ? NASAL FRACTURE SURGERY    ? TRACHEOSTOMY TUBE PLACEMENT N/A 09/14/2020  ? Procedure: TRACHEOSTOMY;  Surgeon: Suzanna ObeyByers, John, MD;  Location: [facility];  Service: ENT;  Laterality: N/A;  ? ?Family History  ?Problem Relation Age of Onset  ? Varicose Veins Brother   ? Drug abuse Brother   ? ?Social  History  ? ?Socioeconomic History  ? Marital status: Married  ?  Spouse name: Not on file  ? Number of children: 1  ? Years of education: 3513  ? Highest education level: Some college, no degree  ?Occupational History  ? Occupation: not working  ?Tobacco Use  ? Smoking status: Never  ? Smokeless tobacco: Former  ?Vaping Use  ? Vaping Use: Never used  ?Substance and Sexual Activity  ? Alcohol use: Yes  ?  Comment: occasionally  ? Drug use: No  ? Sexual activity: Yes  ?  Partners: Female  ?Other Topics Concern  ? Not on file  ?Social History Narrative  ? Lives with wife and daughter in a one story home.  Has one child.  Currently not working.  Education: some college.   ? ?Social Determinants of Health  ? ?Financial Resource Strain: Low Risk   ? Difficulty of Paying Living Expenses: Not hard at all  ?Food Insecurity: No Food Insecurity  ? Worried About Programme researcher, broadcasting/film/videounning Out of Food in the Last Year: Never true  ? Ran Out of Food in the Last Year: Never true  ?Transportation Needs: Unmet Transportation Needs  ? Lack of Transportation (Medical): No  ? Lack of Transportation (Non-Medical): Yes  ?Physical Activity: Inactive  ? Days of Exercise per Week: 0 days  ? Minutes of Exercise per Session: 0 min  ?Stress: Not on file  ?Social Connections: Not on file  ? ? ?Tobacco Counseling ?Counseling given: Not Answered ? ? ?Clinical Intake: ? ?  ? ?  ? ?  ? ?  ? ?  ? ?Diabetic?No ? ?  ? ?  ? ? ?Activities of Daily Living ? ?  10/02/2021  ? 10:59 AM 09/20/2021  ? 10:48 AM  ?In your present state of health, do you have any difficulty performing the following activities:  ?Hearing? 0 1  ?Vision? 0 1  ?Difficulty concentrating or making decisions? 1 1  ?Walking or climbing stairs? 0 1  ?Dressing or bathing? 0 1  ?Doing errands, shopping? 0 1  ?Comment  AT TIMES  ? ? ?Patient Care Team: ?Tyson AliasVincent, Duncan Thomas, MD as PCP - General (Internal Medicine) ?Little IshikawaSchumann, Christopher L, MD as PCP - Cardiology (Cardiology) ?Glendale ChardPatel, Donika K, DO as Consulting  Physician (Neurology) ? ?Indicate any recent Medical Services you may have received from other than [facility] providers in the past year (date may be approximate). ? ?   ?Assessment:  ? This is a routine wellness examination for Joseph Ayala. ? ?Hearing/Vision screen ?No results found. ? ?Dietary issues and exercise activities discussed: ?  ? ? Goals Addressed   ?None ?  ?Depression Screen ? ?  10/02/2021  ? 11:00 AM 09/20/2021  ? 10:48 AM 08/21/2021  ?  3:18 PM 05/15/2021  ? 12:06 PM 10/12/2020  ?  3:16  PM 10/10/2020  ?  3:54 PM 04/11/2020  ? 11:51 AM  ?PHQ 2/9 Scores  ?PHQ - 2 Score 0 1 0 0 0 0 0  ?PHQ- 9 Score     0 5   ?  ?Fall Risk ? ?  10/02/2021  ? 10:59 AM 09/20/2021  ? 10:47 AM 08/21/2021  ?  9:50 AM 05/15/2021  ? 12:02 PM 10/10/2020  ?  3:53 PM  ?Fall Risk   ?Falls in the past year? 1 1 0 0 0  ?Number falls in past yr: 1 1 1  0   ?Injury with Fall? 0 0 0 0   ?Risk for fall due to :  Impaired balance/gait     ?Follow up Falls evaluation completed Falls evaluation completed;Education provided Falls evaluation completed Falls evaluation completed   ? ? ?FALL RISK PREVENTION PERTAINING TO THE HOME: ? ?Any stairs in or around the home? {YES/NO:21197} ?If so, are there any without handrails? {YES/NO:21197} ?Home free of loose throw rugs in walkways, pet beds, electrical cords, etc? {YES/NO:21197} ?Adequate lighting in your home to reduce risk of falls? {YES/NO:21197} ? ?ASSISTIVE DEVICES UTILIZED TO PREVENT FALLS: ? ?Life alert? {YES/NO:21197} ?Use of a cane, walker or w/c? {YES/NO:21197} ?Grab bars in the bathroom? {YES/NO:21197} ?Shower chair or bench in shower? {YES/NO:21197} ?Elevated toilet seat or a handicapped toilet? {YES/NO:21197} ? ?TIMED UP AND GO: ? ?Was the test performed? {YES/NO:21197}.  ?Length of time to ambulate 10 feet: *** sec.  ? ?{Appearance of Gait:2101803} ? ?Cognitive Function: ?  ?  ?  ? ?Immunizations ?Immunization History  ?Administered Date(s) Administered  ? Influenza-Unspecified 05/15/2021  ? PFIZER(Purple  Top)SARS-COV-2 Vaccination 10/08/2019, 11/02/2019  ? PNEUMOCOCCAL CONJUGATE-20 05/15/2021  ? ? ?TDAP status: Up to date ? ?Flu Vaccine status: Up to date ? ?{Pneumococcal vaccine status:2101807} ? ?{Covid-19

## 2021-10-11 ENCOUNTER — Encounter: Payer: Medicare HMO | Admitting: Internal Medicine

## 2021-10-11 LAB — TOXASSURE SELECT,+ANTIDEPR,UR

## 2021-10-12 ENCOUNTER — Other Ambulatory Visit (HOSPITAL_COMMUNITY): Payer: Self-pay

## 2021-10-12 NOTE — Progress Notes (Signed)
AWV not completed at this time.  ?

## 2021-10-19 ENCOUNTER — Other Ambulatory Visit (INDEPENDENT_AMBULATORY_CARE_PROVIDER_SITE_OTHER): Payer: Medicare HMO

## 2021-10-19 DIAGNOSIS — R7989 Other specified abnormal findings of blood chemistry: Secondary | ICD-10-CM | POA: Diagnosis not present

## 2021-10-20 ENCOUNTER — Other Ambulatory Visit (HOSPITAL_COMMUNITY): Payer: Self-pay

## 2021-10-20 LAB — TESTOSTERONE: Testosterone: 593 ng/dL (ref 264–916)

## 2021-10-23 ENCOUNTER — Other Ambulatory Visit: Payer: Medicare HMO

## 2021-10-24 ENCOUNTER — Encounter (INDEPENDENT_AMBULATORY_CARE_PROVIDER_SITE_OTHER): Payer: Medicare HMO | Admitting: Student in an Organized Health Care Education/Training Program

## 2021-10-24 ENCOUNTER — Encounter: Payer: Self-pay | Admitting: Student in an Organized Health Care Education/Training Program

## 2021-10-30 ENCOUNTER — Other Ambulatory Visit (HOSPITAL_COMMUNITY): Payer: Self-pay

## 2021-11-09 ENCOUNTER — Other Ambulatory Visit (HOSPITAL_COMMUNITY): Payer: Self-pay

## 2021-11-09 MED ORDER — BUPRENORPHINE HCL-NALOXONE HCL 8-2 MG SL FILM
1.0000 | ORAL_FILM | Freq: Three times a day (TID) | SUBLINGUAL | 0 refills | Status: DC
  Filled 2021-11-09: qty 90, 30d supply, fill #0

## 2021-11-09 NOTE — Progress Notes (Signed)
Suboxone dose increased to 8mg  TID in the response to increased cravings and high risk for relapse. ?

## 2021-11-23 ENCOUNTER — Other Ambulatory Visit: Payer: Self-pay | Admitting: Physical Medicine and Rehabilitation

## 2021-11-23 ENCOUNTER — Other Ambulatory Visit (HOSPITAL_COMMUNITY): Payer: Self-pay

## 2021-11-24 ENCOUNTER — Encounter: Payer: Self-pay | Admitting: Student in an Organized Health Care Education/Training Program

## 2021-11-29 ENCOUNTER — Other Ambulatory Visit (HOSPITAL_COMMUNITY): Payer: Self-pay

## 2021-11-29 ENCOUNTER — Other Ambulatory Visit: Payer: Self-pay | Admitting: Student in an Organized Health Care Education/Training Program

## 2021-11-29 MED ORDER — TESTOSTERONE CYPIONATE 200 MG/ML IM SOLN
400.0000 mg | INTRAMUSCULAR | 1 refills | Status: DC
Start: 2021-11-29 — End: 2022-01-26
  Filled 2021-11-29: qty 4, 28d supply, fill #0
  Filled 2021-12-29: qty 4, 28d supply, fill #1

## 2021-12-06 ENCOUNTER — Other Ambulatory Visit: Payer: Self-pay | Admitting: Student in an Organized Health Care Education/Training Program

## 2021-12-06 ENCOUNTER — Other Ambulatory Visit (HOSPITAL_COMMUNITY): Payer: Self-pay

## 2021-12-06 MED ORDER — BUPRENORPHINE HCL-NALOXONE HCL 8-2 MG SL FILM
1.0000 | ORAL_FILM | Freq: Three times a day (TID) | SUBLINGUAL | 0 refills | Status: DC
  Filled 2021-12-06 – 2021-12-08 (×2): qty 90, 30d supply, fill #0

## 2021-12-07 ENCOUNTER — Encounter: Payer: Self-pay | Admitting: Student in an Organized Health Care Education/Training Program

## 2021-12-08 ENCOUNTER — Other Ambulatory Visit (HOSPITAL_COMMUNITY): Payer: Self-pay

## 2021-12-11 ENCOUNTER — Encounter: Payer: Medicare HMO | Admitting: Student in an Organized Health Care Education/Training Program

## 2021-12-21 ENCOUNTER — Other Ambulatory Visit (HOSPITAL_COMMUNITY): Payer: Self-pay

## 2021-12-25 ENCOUNTER — Ambulatory Visit (HOSPITAL_COMMUNITY)
Admission: RE | Admit: 2021-12-25 | Discharge: 2021-12-25 | Disposition: A | Discharge status: Home / Self Care | Payer: Medicare HMO | Source: Ambulatory Visit | Attending: Internal Medicine | Admitting: Internal Medicine

## 2021-12-25 ENCOUNTER — Other Ambulatory Visit (HOSPITAL_COMMUNITY): Payer: Self-pay

## 2021-12-25 DIAGNOSIS — M545 Low back pain, unspecified: Secondary | ICD-10-CM | POA: Diagnosis not present

## 2021-12-25 DIAGNOSIS — I4891 Unspecified atrial fibrillation: Secondary | ICD-10-CM | POA: Insufficient documentation

## 2021-12-25 DIAGNOSIS — G729 Myopathy, unspecified: Secondary | ICD-10-CM | POA: Diagnosis not present

## 2021-12-25 DIAGNOSIS — I712 Thoracic aortic aneurysm, without rupture, unspecified: Secondary | ICD-10-CM | POA: Diagnosis not present

## 2021-12-25 DIAGNOSIS — M7989 Other specified soft tissue disorders: Secondary | ICD-10-CM | POA: Insufficient documentation

## 2021-12-25 DIAGNOSIS — I509 Heart failure, unspecified: Secondary | ICD-10-CM | POA: Diagnosis not present

## 2021-12-25 DIAGNOSIS — R29898 Other symptoms and signs involving the musculoskeletal system: Secondary | ICD-10-CM | POA: Diagnosis not present

## 2021-12-25 DIAGNOSIS — G8929 Other chronic pain: Secondary | ICD-10-CM | POA: Diagnosis not present

## 2021-12-25 DIAGNOSIS — I10 Essential (primary) hypertension: Secondary | ICD-10-CM | POA: Diagnosis not present

## 2021-12-25 DIAGNOSIS — I110 Hypertensive heart disease with heart failure: Secondary | ICD-10-CM | POA: Diagnosis not present

## 2021-12-25 DIAGNOSIS — G629 Polyneuropathy, unspecified: Secondary | ICD-10-CM | POA: Diagnosis not present

## 2021-12-25 LAB — ECHOCARDIOGRAM COMPLETE
Area-P 1/2: 4.39 cm2
Calc EF: 53.6 %
S' Lateral: 3.4 cm
Single Plane A2C EF: 54.6 %
Single Plane A4C EF: 52.7 %

## 2021-12-25 NOTE — Progress Notes (Signed)
  Echocardiogram 2D Echocardiogram has been performed.  Augustine Radar 12/25/2021, 12:00 PM

## 2021-12-29 ENCOUNTER — Other Ambulatory Visit (HOSPITAL_COMMUNITY): Payer: Self-pay

## 2022-01-01 ENCOUNTER — Encounter: Payer: Self-pay | Admitting: Student in an Organized Health Care Education/Training Program

## 2022-01-01 ENCOUNTER — Ambulatory Visit (INDEPENDENT_AMBULATORY_CARE_PROVIDER_SITE_OTHER): Payer: Medicare HMO | Admitting: Student in an Organized Health Care Education/Training Program

## 2022-01-01 ENCOUNTER — Other Ambulatory Visit (HOSPITAL_COMMUNITY): Payer: Self-pay

## 2022-01-01 VITALS — BP 126/88 | HR 109 | Temp 98.1°F | Ht 71.0 in | Wt 205.1 lb

## 2022-01-01 DIAGNOSIS — B351 Tinea unguium: Secondary | ICD-10-CM

## 2022-01-01 DIAGNOSIS — Z87891 Personal history of nicotine dependence: Secondary | ICD-10-CM

## 2022-01-01 DIAGNOSIS — F909 Attention-deficit hyperactivity disorder, unspecified type: Secondary | ICD-10-CM | POA: Diagnosis not present

## 2022-01-01 DIAGNOSIS — F1190 Opioid use, unspecified, uncomplicated: Secondary | ICD-10-CM | POA: Diagnosis not present

## 2022-01-01 DIAGNOSIS — I10 Essential (primary) hypertension: Secondary | ICD-10-CM | POA: Diagnosis not present

## 2022-01-01 DIAGNOSIS — F1994 Other psychoactive substance use, unspecified with psychoactive substance-induced mood disorder: Secondary | ICD-10-CM

## 2022-01-01 DIAGNOSIS — G7289 Other specified myopathies: Secondary | ICD-10-CM | POA: Diagnosis not present

## 2022-01-01 MED ORDER — BUPRENORPHINE HCL-NALOXONE HCL 8-2 MG SL FILM
1.0000 | ORAL_FILM | Freq: Three times a day (TID) | SUBLINGUAL | 0 refills | Status: DC
  Filled 2022-01-01: qty 90, 30d supply, fill #0

## 2022-01-01 MED ORDER — CICLOPIROX 8 % EX SOLN
Freq: Every day | CUTANEOUS | 0 refills | Status: DC
  Filled 2022-01-01: qty 6.6, 30d supply, fill #0

## 2022-01-01 NOTE — Assessment & Plan Note (Signed)
Long standing issue but without treatment for several years due to his other medical problems. He did follow up with [facility] neurology last week, they are planning for repeat antibody testing and EMG/NCS. We talked about the importance of follow up for that problem, completing all the testing, and limiting other substances that may interfere with the testing. I think this chronic inflammatory myopathy is contributing to his decline in his quality of life, and treatment in the future may be very beneficial.

## 2022-01-26 ENCOUNTER — Other Ambulatory Visit: Payer: Self-pay | Admitting: Student in an Organized Health Care Education/Training Program

## 2022-01-26 ENCOUNTER — Other Ambulatory Visit (HOSPITAL_COMMUNITY): Payer: Self-pay

## 2022-01-26 NOTE — Telephone Encounter (Signed)
Last appointment 01/01/2022.  Next appointment  02/05/2022.  Last ToxAssure 10/19/2021.

## 2022-01-27 MED ORDER — TESTOSTERONE CYPIONATE 200 MG/ML IM SOLN
400.0000 mg | INTRAMUSCULAR | 1 refills | Status: DC
  Filled 2022-01-27: qty 4, 28d supply, fill #0
  Filled 2022-02-28: qty 4, 28d supply, fill #1

## 2022-01-27 MED ORDER — BUPRENORPHINE HCL-NALOXONE HCL 8-2 MG SL FILM
1.0000 | ORAL_FILM | Freq: Three times a day (TID) | SUBLINGUAL | 0 refills | Status: DC
  Filled 2022-01-27 – 2022-01-29 (×2): qty 90, 30d supply, fill #0

## 2022-01-29 ENCOUNTER — Other Ambulatory Visit (HOSPITAL_COMMUNITY): Payer: Self-pay

## 2022-02-02 ENCOUNTER — Other Ambulatory Visit (HOSPITAL_COMMUNITY): Payer: Self-pay

## 2022-02-02 DIAGNOSIS — F1510 Other stimulant abuse, uncomplicated: Secondary | ICD-10-CM | POA: Diagnosis not present

## 2022-02-02 DIAGNOSIS — F1120 Opioid dependence, uncomplicated: Secondary | ICD-10-CM | POA: Diagnosis not present

## 2022-02-02 DIAGNOSIS — F1290 Cannabis use, unspecified, uncomplicated: Secondary | ICD-10-CM | POA: Diagnosis not present

## 2022-02-02 DIAGNOSIS — F310 Bipolar disorder, current episode hypomanic: Secondary | ICD-10-CM | POA: Diagnosis not present

## 2022-02-02 DIAGNOSIS — Z79899 Other long term (current) drug therapy: Secondary | ICD-10-CM | POA: Diagnosis not present

## 2022-02-02 MED ORDER — LAMOTRIGINE 25 MG PO TABS
ORAL_TABLET | ORAL | 0 refills | Status: DC
  Filled 2022-02-02: qty 60, 30d supply, fill #0

## 2022-02-02 MED ORDER — TRAZODONE HCL 50 MG PO TABS
50.0000 mg | ORAL_TABLET | Freq: Every evening | ORAL | 1 refills | Status: DC | PRN
  Filled 2022-02-02: qty 60, 30d supply, fill #0
  Filled 2022-03-06: qty 60, 30d supply, fill #1

## 2022-02-05 ENCOUNTER — Encounter: Payer: Medicare HMO | Admitting: Student in an Organized Health Care Education/Training Program

## 2022-02-07 ENCOUNTER — Other Ambulatory Visit (HOSPITAL_COMMUNITY): Payer: Self-pay

## 2022-02-12 ENCOUNTER — Telehealth: Payer: Self-pay | Admitting: Student in an Organized Health Care Education/Training Program

## 2022-02-12 ENCOUNTER — Telehealth: Payer: Self-pay | Admitting: *Deleted

## 2022-02-12 NOTE — Telephone Encounter (Addendum)
Patient called in stating his wife has been sick x 1 week and tested positive for Covid today. Patient has had symptoms x 3 days that include severe h/a, cough, productive of yellow sputum, muscle cramps, sore throat, hoarseness, and fever 100.2. Advised plenty of fluids, especially water, plenty of rest, good hand washing. Instructed to isolate for 5 days from onset of symptoms and to wear high quality mask when out for additional 5 days. He is interested in treatment options. He will take a home test and call back with result.

## 2022-02-12 NOTE — Telephone Encounter (Signed)
Please call the patient back. Pt states he tested positive for Covid.

## 2022-02-13 ENCOUNTER — Other Ambulatory Visit (HOSPITAL_COMMUNITY): Payer: Self-pay

## 2022-02-13 MED ORDER — PAXLOVID (300/100) 20 X 150 MG & 10 X 100MG PO TBPK
3.0000 | ORAL_TABLET | Freq: Two times a day (BID) | ORAL | 0 refills | Status: AC
  Filled 2022-02-13: qty 30, 5d supply, fill #0

## 2022-02-13 NOTE — Telephone Encounter (Signed)
I just spoke with the patient. He is having mild symptoms currently, four days from onset. He has a history of severe pneumonia last year resulting in prolonged ICU stay, he is at risk for another severe infection. Will prescribe paxlovid.

## 2022-02-13 NOTE — Telephone Encounter (Signed)
RTC to patient stated tested positive foe Covid on yesterday.  Symptoms started on Friday.   Has had some fevers has not taken temperature.  Last took Tylenol last night.  Has had a cough.  Just feels tired.  Has been taking over the counter meds.  Fells ok/  Just wanted [place] to know he tested positive.

## 2022-02-14 NOTE — Telephone Encounter (Signed)
Call placed to patient to see how he is recovering from Covid. States he feels much better today. Taking paxlovid as prescribed.

## 2022-02-26 ENCOUNTER — Encounter: Payer: Medicare HMO | Admitting: Student in an Organized Health Care Education/Training Program

## 2022-02-28 ENCOUNTER — Other Ambulatory Visit: Payer: Self-pay | Admitting: Student in an Organized Health Care Education/Training Program

## 2022-02-28 ENCOUNTER — Other Ambulatory Visit (HOSPITAL_COMMUNITY): Payer: Self-pay

## 2022-02-28 ENCOUNTER — Other Ambulatory Visit: Payer: Self-pay

## 2022-02-28 ENCOUNTER — Encounter: Payer: Self-pay | Admitting: Student in an Organized Health Care Education/Training Program

## 2022-02-28 MED ORDER — LAMOTRIGINE 25 MG PO TABS
ORAL_TABLET | ORAL | 0 refills | Status: DC
  Filled 2022-02-28: qty 60, 30d supply, fill #0

## 2022-02-28 MED ORDER — BUPRENORPHINE HCL-NALOXONE HCL 8-2 MG SL FILM
1.0000 | ORAL_FILM | Freq: Three times a day (TID) | SUBLINGUAL | 0 refills | Status: DC
  Filled 2022-02-28: qty 42, 14d supply, fill #0

## 2022-02-28 NOTE — Telephone Encounter (Signed)
Patient has had two cancelled appointments with me since I last saw him in June. I will approve a 2 week supply of suboxone, but I want to see him in clinic very soon. Can you see if we can add him to my schedule for Monday 8/28 at 8:15am please?

## 2022-02-28 NOTE — Patient Outreach (Signed)
[facility]) Care Management  02/28/2022  Joseph Ayala 05/19/1966 [id number]   Telephone call to patient for nurse call. Patient reports he is doing better. He recently had COVID-19.  He reports some lingering cough, headache and runny nose but denies any worsening. Discussed COVID-19 and lingering affects.  Patient did not complete his AWV earlier this year.  Discussed visit with patient and the importance. He states that he will cal to reschedule.  Discussed [facility] services and support.  Patient declined needing further calls at this time.    Plan: RN CM will close case.   Bary Leriche, RN, MSN [facility] Care Management Care Management Coordinator Direct Line [phone] Toll Free: [phone]  Fax: [phone]

## 2022-02-28 NOTE — Telephone Encounter (Signed)
Last ToxAssure 10/02/2021. Last appointment 02/26/2022.

## 2022-03-06 ENCOUNTER — Other Ambulatory Visit (HOSPITAL_COMMUNITY): Payer: Self-pay

## 2022-03-06 DIAGNOSIS — F1220 Cannabis dependence, uncomplicated: Secondary | ICD-10-CM | POA: Diagnosis not present

## 2022-03-06 DIAGNOSIS — F1520 Other stimulant dependence, uncomplicated: Secondary | ICD-10-CM | POA: Diagnosis not present

## 2022-03-06 DIAGNOSIS — F10232 Alcohol dependence with withdrawal with perceptual disturbance: Secondary | ICD-10-CM | POA: Diagnosis not present

## 2022-03-06 DIAGNOSIS — F1020 Alcohol dependence, uncomplicated: Secondary | ICD-10-CM | POA: Diagnosis not present

## 2022-03-06 DIAGNOSIS — F1120 Opioid dependence, uncomplicated: Secondary | ICD-10-CM | POA: Diagnosis not present

## 2022-03-06 DIAGNOSIS — F1123 Opioid dependence with withdrawal: Secondary | ICD-10-CM | POA: Diagnosis not present

## 2022-03-08 DIAGNOSIS — F419 Anxiety disorder, unspecified: Secondary | ICD-10-CM | POA: Diagnosis not present

## 2022-03-08 DIAGNOSIS — F1220 Cannabis dependence, uncomplicated: Secondary | ICD-10-CM | POA: Diagnosis not present

## 2022-03-08 DIAGNOSIS — E7404 McArdle disease: Secondary | ICD-10-CM | POA: Diagnosis not present

## 2022-03-08 DIAGNOSIS — F11259 Opioid dependence with opioid-induced psychotic disorder, unspecified: Secondary | ICD-10-CM | POA: Diagnosis not present

## 2022-03-08 DIAGNOSIS — G4700 Insomnia, unspecified: Secondary | ICD-10-CM | POA: Diagnosis not present

## 2022-03-08 DIAGNOSIS — R440 Auditory hallucinations: Secondary | ICD-10-CM | POA: Diagnosis not present

## 2022-03-08 DIAGNOSIS — F1910 Other psychoactive substance abuse, uncomplicated: Secondary | ICD-10-CM | POA: Diagnosis not present

## 2022-03-08 DIAGNOSIS — F1520 Other stimulant dependence, uncomplicated: Secondary | ICD-10-CM | POA: Diagnosis not present

## 2022-03-08 DIAGNOSIS — F17210 Nicotine dependence, cigarettes, uncomplicated: Secondary | ICD-10-CM | POA: Diagnosis not present

## 2022-03-08 DIAGNOSIS — F1123 Opioid dependence with withdrawal: Secondary | ICD-10-CM | POA: Diagnosis not present

## 2022-03-08 DIAGNOSIS — F1120 Opioid dependence, uncomplicated: Secondary | ICD-10-CM | POA: Diagnosis not present

## 2022-03-08 DIAGNOSIS — F1020 Alcohol dependence, uncomplicated: Secondary | ICD-10-CM | POA: Diagnosis not present

## 2022-03-08 DIAGNOSIS — F10232 Alcohol dependence with withdrawal with perceptual disturbance: Secondary | ICD-10-CM | POA: Diagnosis not present

## 2022-03-08 DIAGNOSIS — R45851 Suicidal ideations: Secondary | ICD-10-CM | POA: Diagnosis not present

## 2022-03-14 ENCOUNTER — Encounter: Payer: Self-pay | Admitting: Internal Medicine

## 2022-03-14 ENCOUNTER — Other Ambulatory Visit (HOSPITAL_COMMUNITY): Payer: Self-pay

## 2022-03-14 ENCOUNTER — Encounter: Payer: Self-pay | Admitting: Student in an Organized Health Care Education/Training Program

## 2022-03-14 ENCOUNTER — Other Ambulatory Visit: Payer: Self-pay

## 2022-03-14 ENCOUNTER — Ambulatory Visit (INDEPENDENT_AMBULATORY_CARE_PROVIDER_SITE_OTHER): Payer: Medicare HMO | Admitting: Internal Medicine

## 2022-03-14 VITALS — BP 151/84 | HR 102 | Temp 97.8°F | Ht 71.0 in | Wt 211.6 lb

## 2022-03-14 DIAGNOSIS — R0602 Shortness of breath: Secondary | ICD-10-CM

## 2022-03-14 DIAGNOSIS — F1994 Other psychoactive substance use, unspecified with psychoactive substance-induced mood disorder: Secondary | ICD-10-CM

## 2022-03-14 DIAGNOSIS — Z87891 Personal history of nicotine dependence: Secondary | ICD-10-CM | POA: Diagnosis not present

## 2022-03-14 DIAGNOSIS — I10 Essential (primary) hypertension: Secondary | ICD-10-CM | POA: Diagnosis not present

## 2022-03-14 DIAGNOSIS — F1190 Opioid use, unspecified, uncomplicated: Secondary | ICD-10-CM | POA: Diagnosis not present

## 2022-03-14 DIAGNOSIS — F1090 Alcohol use, unspecified, uncomplicated: Secondary | ICD-10-CM

## 2022-03-14 MED ORDER — BUPRENORPHINE HCL-NALOXONE HCL 8-2 MG SL FILM
1.0000 | ORAL_FILM | Freq: Three times a day (TID) | SUBLINGUAL | 0 refills | Status: DC
  Filled 2022-03-14: qty 13, 5d supply, fill #0
  Filled 2022-03-14: qty 29, 9d supply, fill #0

## 2022-03-14 MED ORDER — CHLORTHALIDONE 25 MG PO TABS
25.0000 mg | ORAL_TABLET | Freq: Every day | ORAL | 3 refills | Status: DC
  Filled 2022-03-14 (×2): qty 90, 90d supply, fill #0
  Filled 2022-07-13: qty 90, 90d supply, fill #1
  Filled 2022-10-26: qty 90, 90d supply, fill #2
  Filled 2022-11-27 – 2023-01-09 (×3): qty 90, 90d supply, fill #3

## 2022-03-14 MED ORDER — BUPRENORPHINE HCL-NALOXONE HCL 8-2 MG SL FILM
1.0000 | ORAL_FILM | Freq: Three times a day (TID) | SUBLINGUAL | 0 refills | Status: DC
  Filled 2022-03-14 – 2022-03-28 (×2): qty 42, 14d supply, fill #0

## 2022-03-14 NOTE — Progress Notes (Signed)
Subjective:  CC: opioid use disorder  HPI:  Joseph Ayala is a 57 y.o. male with a past medical history stated below and presents today for opioid use disorder. He decided to go to [place] to attend a rehab facility because of his difficulty with stopping substance use. He was there for 2 days, then transferred to [facility] for continued care. Please see problem based assessment and plan for additional details.  Past Medical History:  Diagnosis Date   Acute HFmrEF (heart failure with mildly reduced ejection fraction) (HCC) 10/31/2020   Acute hypoxemic respiratory failure (HCC)    Acute respiratory failure with hypoxia (HCC) 08/28/2020   CAP (community acquired pneumonia) 08/28/2020   Degenerative disc disease, lumbar    Dental crown present    ETOH abuse    History of kidney stones    Hypertension    states under control with meds., has been on med. x 1 yr. - is currently out of med., to see PCP 02/06/2018   Marijuana use, continuous    MRSA pneumonia (HCC) 09/09/2020   Muscle atrophy 01/2018   Muscle weakness 01/2018   Necrotizing myopathy    Necrotizing pneumonia (HCC) 09/09/2020   Neuropathic pain    Opioid use disorder    PAF (paroxysmal atrial fibrillation) (HCC)    Pleural effusion    Sepsis (HCC) 08/28/2020   Thoracic aortic aneurysm (TAA) (HCC)    a. seen on CT 08/2020.    Current Outpatient Medications on File Prior to Visit  Medication Sig Dispense Refill   ezetimibe (ZETIA) 10 MG tablet Take 1 tablet (10 mg total) by mouth daily. 90 tablet 3   lamoTRIgine (LAMICTAL) 25 MG tablet Take 1 tablet (25 mg total) by mouth daily for 14 days, THEN 2 tablets (50 mg total) daily thereafter. 60 tablet 0   metoprolol succinate (TOPROL-XL) 50 MG 24 hr tablet Take 1 tablet (50 mg total) by mouth daily. Take with or immediately following a meal. 90 tablet 3   sildenafil (VIAGRA) 25 MG tablet Take 1 tablet (25 mg total) by mouth as needed for erectile  dysfunction. 20 tablet 1   testosterone cypionate (DEPOTESTOSTERONE CYPIONATE) 200 MG/ML injection Inject 2 mLs (400 mg total) into the muscle every 14 (fourteen) days. 4 mL 1   traZODone (DESYREL) 50 MG tablet Take 1-2 tablets (50-100 mg total) by mouth at bedtime as needed for sleep 60 tablet 1   No current facility-administered medications on file prior to visit.    Family History  Problem Relation Age of Onset   Varicose Veins Brother    Drug abuse Brother     Social History   Socioeconomic History   Marital status: Married    Spouse name: Not on file   Number of children: 1   Years of education: 13   Highest education level: Some college, no degree  Occupational History   Occupation: not working  Tobacco Use   Smoking status: Never   Smokeless tobacco: Former  Building services engineer Use: Never used  Substance and Sexual Activity   Alcohol use: Yes    Comment: occasionally   Drug use: No   Sexual activity: Yes    Partners: Female  Other Topics Concern   Not on file  Social History Narrative   Lives with wife and daughter in a one story home.  Has one child.  Currently not working.  Education: some college.    Social Determinants of Health  Financial Resource Strain: Low Risk  (11/21/2020)   Overall Financial Resource Strain (CARDIA)    Difficulty of Paying Living Expenses: Not hard at all  Food Insecurity: No Food Insecurity (11/21/2020)   Hunger Vital Sign    Worried About Running Out of Food in the Last Year: Never true    Ran Out of Food in the Last Year: Never true  Transportation Needs: No Transportation Needs (02/28/2022)   PRAPARE - Administrator, Civil Service (Medical): No    Lack of Transportation (Non-Medical): No  Physical Activity: Inactive (11/21/2020)   Exercise Vital Sign    Days of Exercise per Week: 0 days    Minutes of Exercise per Session: 0 min  Stress: Not on file  Social Connections: Not on file  Intimate Partner Violence: Not  on file    Review of Systems: ROS negative except for what is noted on the assessment and plan.  Objective:   Vitals:   03/14/22 1016  BP: (!) 151/84  Pulse: (!) 102  Temp: 97.8 F (36.6 C)  TempSrc: Oral  SpO2: 98%  Weight: 211 lb 9.6 oz (96 kg)  Height: 5\' 11"  (1.803 m)    Physical Exam: Constitutional: well-appearing, in no acute distress HENT: normocephalic atraumatic, mucous membranes moist Eyes: conjunctiva erythematous Neck: supple Cardiovascular: regular rate and rhythm, no m/r/g Pulmonary/Chest: normal work of breathing on room air, lungs clear to auscultation bilaterally Abdominal: soft, non-tender, non-distended MSK: normal bulk and tone Neurological: alert & oriented x 3, 5/5 strength in bilateral upper and lower extremities, normal gait Skin: warm and dry Psych: normal mood and afffect     Assessment & Plan:  Opioid use disorder History of fentanyl, methamphatamine use and alcohol consumption. He states that he was using daily, but decided to go to [place] to rehab last week. He was there from Aug 29-30th and then transferred to [facility] due to increased symptoms from withdrawal. He remained there from Aug 31 to Sept 5th. He returned home yesterday and is trying to get plugged into local NA and AA groups. Last methadone dose was 27 hours ago. He has not felt symptomatic from withdrawals in several days, but does feel achy. He was previously doing ok on suboxone 3 films daily and is interested in restarting medication. P: PDMP reviewed and appropriate Suboxone TID, 42 films. Refill sent  Follow-up with PCP in 4 weeks  Hypertension His medications were taken while he was at facility in [place] and he did not get those back. Has not taken medications in several days. Denies headache, chest pain and shortness of breath. Blood pressure elevated at 151/84. P: Refills available for metoprolol -refills sent for chlorthalidone 25 mg    Substance induced mood disorder (HCC) He was seen by behavioral health in [place]. Lamictal 25 mg and trazodone were continued at discharge. He recently established care with psychiatry in [place] and goal was to discontinue methamphatamine prior to further evaluation. P: Provided patient with phone number and asked him to follow-up with psychiatry. -continue lamictal and trazodone  Alcohol use disorder He states that he was drinking about 12 oz of liquior daily prior to last week. He plans to attend AA meetings. P: Not able to take naltrexone with suboxone. Could consider acamprosate in future if he is interested.   Patient seen with Dr. [place] Kristian Mogg, D.O. [facility]  PGY-2 Pager: [phone]  Phone: [phone] Date 03/15/2022  Time 10:56 AM

## 2022-03-14 NOTE — Patient Instructions (Addendum)
Thank you, Mr.Daiden T Vides for allowing [place] to provide your care today. Today we discussed Opioid use disorder.  OUD: I have sent in -Suboxone with a refill. Please follow-up with Dr. Oswaldo Done in 4 weeks.  Please call Dr. Zachery Conch with psychiatry at [phone].  I have ordered the following labs for you:  Lab Orders  No laboratory test(s) ordered today     Tests ordered today:    Referrals ordered today:   Referral Orders  No referral(s) requested today     I have ordered the following medication/changed the following medications:   Stop the following medications: Medications Discontinued During This Encounter  Medication Reason   ciclopirox (PENLAC) 8 % solution Discontinued by provider     Start the following medications: No orders of the defined types were placed in this encounter.    Follow up:  4 weeks  with Dr. Oswaldo Done  We look forward to seeing you next time. Please call our clinic at [phone] if you have any questions or concerns. The best time to call is Monday-Friday from 9am-4pm, but there is someone available 24/7. If after hours or the weekend, call the main hospital number and ask for the Internal Medicine Resident On-Call. If you need medication refills, please notify your pharmacy one week in advance and they will send [place] a request.   Thank you for trusting me with your care. Wishing you the best!   Rudene Christians, DO [facility]

## 2022-03-15 ENCOUNTER — Other Ambulatory Visit (HOSPITAL_COMMUNITY): Payer: Self-pay

## 2022-03-15 ENCOUNTER — Other Ambulatory Visit: Payer: Self-pay | Admitting: Surgery

## 2022-03-15 DIAGNOSIS — F1090 Alcohol use, unspecified, uncomplicated: Secondary | ICD-10-CM | POA: Insufficient documentation

## 2022-03-15 DIAGNOSIS — I712 Thoracic aortic aneurysm, without rupture, unspecified: Secondary | ICD-10-CM

## 2022-03-15 NOTE — Assessment & Plan Note (Signed)
His medications were taken while he was at facility in [place] and he did not get those back. Has not taken medications in several days. Denies headache, chest pain and shortness of breath. Blood pressure elevated at 151/84. P: Refills available for metoprolol -refills sent for chlorthalidone 25 mg

## 2022-03-15 NOTE — Assessment & Plan Note (Signed)
He was seen by behavioral health in [place]. Lamictal 25 mg and trazodone were continued at discharge. He recently established care with psychiatry in [place] and goal was to discontinue methamphatamine prior to further evaluation. P: Provided patient with phone number and asked him to follow-up with psychiatry. -continue lamictal and trazodone

## 2022-03-15 NOTE — Assessment & Plan Note (Addendum)
History of fentanyl, methamphatamine use and alcohol consumption. He states that he was using daily, but decided to go to [place] to rehab last week. He was there from Aug 29-30th and then transferred to [facility] due to increased symptoms from withdrawal. He remained there from Aug 31 to Sept 5th. He returned home yesterday and is trying to get plugged into local NA and AA groups. Last methadone dose was 27 hours ago. He has not felt symptomatic from withdrawals in several days, but does feel achy. He was previously doing ok on suboxone 3 films daily and is interested in restarting medication. P: PDMP reviewed and appropriate Suboxone TID, 42 films. Refill sent  Follow-up with PCP in 4 weeks

## 2022-03-15 NOTE — Assessment & Plan Note (Signed)
He states that he was drinking about 12 oz of liquior daily prior to last week. He plans to attend AA meetings. P: Not able to take naltrexone with suboxone. Could consider acamprosate in future if he is interested.

## 2022-03-16 ENCOUNTER — Other Ambulatory Visit: Payer: Self-pay | Admitting: Student in an Organized Health Care Education/Training Program

## 2022-03-16 ENCOUNTER — Other Ambulatory Visit (HOSPITAL_COMMUNITY): Payer: Self-pay

## 2022-03-16 MED ORDER — CICLOPIROX 8 % EX SOLN
Freq: Every day | CUTANEOUS | 0 refills | Status: DC
  Filled 2022-03-16: qty 6.6, 30d supply, fill #0

## 2022-03-16 NOTE — Progress Notes (Signed)
Internal Medicine Clinic Attending  I saw and evaluated the patient.  I personally confirmed the key portions of the history and exam documented by Dr. Masters and I reviewed pertinent patient test results.  The assessment, diagnosis, and plan were formulated together and I agree with the documentation in the resident's note.  

## 2022-03-19 ENCOUNTER — Ambulatory Visit (INDEPENDENT_AMBULATORY_CARE_PROVIDER_SITE_OTHER): Payer: Medicare HMO

## 2022-03-19 DIAGNOSIS — Z0000 Encounter for general adult medical examination without abnormal findings: Secondary | ICD-10-CM

## 2022-03-19 NOTE — Progress Notes (Signed)
Subjective:   Joseph Ayala is a 56 y.o. male who presents for an Initial Medicare Annual Wellness Visit. I connected with  Joseph Ayala on 03/19/22 by a audio enabled telemedicine application and verified that I am speaking with the correct person using two identifiers.  Patient Location: Other:  Office/Clinic  Provider Location: Office/Clinic  I discussed the limitations of evaluation and management by telemedicine. The patient expressed understanding and agreed to proceed.  Review of Systems    Defer to PCP       Objective:    Today's Vitals   03/19/22 1311  PainSc: 6    There is no height or weight on file to calculate BMI.     03/19/2022    1:15 PM 03/14/2022   10:14 AM 01/01/2022   11:50 AM 10/02/2021    9:08 AM 09/20/2021   10:47 AM 08/21/2021    9:48 AM 05/16/2021    9:32 AM  Advanced Directives  Does Patient Have a Medical Advance Directive? No No No No No No No  Would patient like information on creating a medical advance directive? No - Patient declined No - Patient declined No - Patient declined No - Patient declined No - Patient declined No - Patient declined No - Patient declined    Current Medications (verified) Outpatient Encounter Medications as of 03/19/2022  Medication Sig   Buprenorphine HCl-Naloxone HCl 8-2 MG FILM Place 1 Film under the tongue in the morning, at noon, and at bedtime.   [START ON 03/28/2022] Buprenorphine HCl-Naloxone HCl 8-2 MG FILM Place 1 Film under the tongue in the morning, at noon, and at bedtime.   chlorthalidone (HYGROTON) 25 MG tablet Take 1 tablet (25 mg total) by mouth daily.   ciclopirox (PENLAC) 8 % solution Apply topically at bedtime. Apply over nail and surrounding skin. Apply daily over previous coat. After seven (7) days, may remove with alcohol and continue cycle.   ezetimibe (ZETIA) 10 MG tablet Take 1 tablet (10 mg total) by mouth daily.   lamoTRIgine (LAMICTAL) 25 MG tablet Take 1 tablet (25 mg total) by mouth daily  for 14 days, THEN 2 tablets (50 mg total) daily thereafter.   metoprolol succinate (TOPROL-XL) 50 MG 24 hr tablet Take 1 tablet (50 mg total) by mouth daily. Take with or immediately following a meal.   sildenafil (VIAGRA) 25 MG tablet Take 1 tablet (25 mg total) by mouth as needed for erectile dysfunction.   testosterone cypionate (DEPOTESTOSTERONE CYPIONATE) 200 MG/ML injection Inject 2 mLs (400 mg total) into the muscle every 14 (fourteen) days.   traZODone (DESYREL) 50 MG tablet Take 1-2 tablets (50-100 mg total) by mouth at bedtime as needed for sleep   No facility-administered encounter medications on file as of 03/19/2022.    Allergies (verified) Lyrica cr [pregabalin er], Vicodin [hydrocodone-acetaminophen], and Quinolones   History: Past Medical History:  Diagnosis Date   Acute HFmrEF (heart failure with mildly reduced ejection fraction) ([place]) 10/31/2020   Acute hypoxemic respiratory failure (HCC)    Acute respiratory failure with hypoxia ([place]) 08/28/2020   CAP (community acquired pneumonia) 08/28/2020   Degenerative disc disease, lumbar    Dental crown present    ETOH abuse    History of kidney stones    Hypertension    states under control with meds., has been on med. x 1 yr. - is currently out of med., to see PCP 02/06/2018   Marijuana use, continuous    MRSA pneumonia ([place]) 09/09/2020  Muscle atrophy 01/2018   Muscle weakness 01/2018   Necrotizing myopathy    Necrotizing pneumonia ([place]) 09/09/2020   Neuropathic pain    Opioid use disorder    PAF (paroxysmal atrial fibrillation) (HCC)    Pleural effusion    Sepsis ([place]) 08/28/2020   Thoracic aortic aneurysm (TAA) ([place])    a. seen on CT 08/2020.   Past Surgical History:  Procedure Laterality Date   ENDOVENOUS ABLATION SAPHENOUS VEIN W/ LASER Right 12-29-2015   endovenous laser ablation right greater saphenous vein, stab phlebectomy > 20 incisions right leg, sclerotherapy right leg by Curt Jews MD     MINOR MUSCLE BIOPSY  Right 02/11/2018   Procedure: RECTUS FEMORIS MUSCLE BIOPSY;  Surgeon: Armandina Gemma, MD;  Location: [place];  Service: General;  Laterality: Right;   MUSCLE BIOPSY Right 02/11/2018   Procedure: DELTOID MUSCLE BIOPSY;  Surgeon: Armandina Gemma, MD;  Location: [place];  Service: General;  Laterality: Right;   NASAL FRACTURE SURGERY     TRACHEOSTOMY TUBE PLACEMENT N/A 09/14/2020   Procedure: TRACHEOSTOMY;  Surgeon: Melissa Montane, MD;  Location: [facility];  Service: ENT;  Laterality: N/A;   Family History  Problem Relation Age of Onset   Varicose Veins Brother    Drug abuse Brother    Social History   Socioeconomic History   Marital status: Married    Spouse name: Not on file   Number of children: 1   Years of education: 13   Highest education level: Some college, no degree  Occupational History   Occupation: not working  Tobacco Use   Smoking status: Never   Smokeless tobacco: Former  Scientific laboratory technician Use: Never used  Substance and Sexual Activity   Alcohol use: Not Currently    Comment: occasionally   Drug use: No   Sexual activity: Yes    Partners: Female    Birth control/protection: None  Other Topics Concern   Not on file  Social History Narrative   Lives with wife and daughter in a one story home.  Has one child.  Currently not working.  Education: some college.    Social Determinants of Health   Financial Resource Strain: Low Risk  (11/21/2020)   Overall Financial Resource Strain (CARDIA)    Difficulty of Paying Living Expenses: Not hard at all  Food Insecurity: No Food Insecurity (11/21/2020)   Hunger Vital Sign    Worried About Running Out of Food in the Last Year: Never true    Ran Out of Food in the Last Year: Never true  Transportation Needs: No Transportation Needs (02/28/2022)   PRAPARE - Hydrologist (Medical): No    Lack of Transportation (Non-Medical): No  Physical Activity: Inactive (11/21/2020)    Exercise Vital Sign    Days of Exercise per Week: 0 days    Minutes of Exercise per Session: 0 min  Stress: Not on file  Social Connections: Not on file    Tobacco Counseling Counseling given: Not Answered   Clinical Intake:  Pre-visit preparation completed: Yes  Pain : 0-10 Pain Score: 6  Pain Type: Chronic pain Pain Location: Back Pain Orientation: Right, Left Pain Radiating Towards: legs Pain Descriptors / Indicators: Aching, Cramping Pain Onset: More than a month ago Pain Frequency: Constant     Nutritional Risks: None Diabetes: No  How often do you need to have someone help you when you read instructions, pamphlets, or other written materials  from your doctor or pharmacy?: 1 - Never What is the last grade level you completed in school?: college 2 years  Diabetic?No  Interpreter Needed?: No  Information entered by :: Corey Skains Tyreesha Maharaj,cma 03/19/22 1:13pm   Activities of Daily Living    03/19/2022    1:14 PM 03/14/2022   10:16 AM  In your present state of health, do you have any difficulty performing the following activities:  Hearing? 0 1  Vision? 1 0  Difficulty concentrating or making decisions? 1 0  Walking or climbing stairs? 1 1  Dressing or bathing? 0 0  Doing errands, shopping? 0 0    Patient Care Team: [name], MD as PCP - General (Internal Medicine) Donato Heinz, MD as PCP - Cardiology (Cardiology) Alda Berthold, DO as Consulting Physician (Neurology)  Indicate any recent Medical Services you may have received from other than [facility] providers in the past year (date may be approximate).     Assessment:   This is a routine wellness examination for Joell.  Hearing/Vision screen No results found.  Dietary issues and exercise activities discussed:     Goals Addressed   None   Depression Screen    03/19/2022    1:14 PM 03/14/2022   10:15 AM 02/28/2022    9:46 AM 01/01/2022   11:50 AM 10/02/2021   11:00 AM 09/20/2021    10:48 AM 08/21/2021    3:18 PM  PHQ 2/9 Scores  PHQ - 2 Score 0 0 0 0 0 1 0    Fall Risk    03/19/2022    1:13 PM 03/14/2022   10:15 AM 01/01/2022   11:49 AM 10/02/2021   10:59 AM 09/20/2021   10:47 AM  Fall Risk   Falls in the past year? 1 0 1 1 1   Number falls in past yr: 1 1 0 1 1  Injury with Fall? 1 1 0 0 0  Comment  DID NOT GO TO ED     Risk for fall due to : Impaired balance/gait;History of fall(s) No Fall Risks   Impaired balance/gait  Follow up Falls evaluation completed;Falls prevention discussed Falls evaluation completed Falls evaluation completed Falls evaluation completed Falls evaluation completed;Education provided    FALL RISK PREVENTION PERTAINING TO THE HOME:  Any stairs in or around the home? No  If so, are there any without handrails? No  Home free of loose throw rugs in walkways, pet beds, electrical cords, etc? Yes  Adequate lighting in your home to reduce risk of falls? Yes   ASSISTIVE DEVICES UTILIZED TO PREVENT FALLS:  Life alert? No  Use of a cane, walker or w/c? Yes  Grab bars in the bathroom? Yes  Shower chair or bench in shower? Yes  Elevated toilet seat or a handicapped toilet? No   TIMED UP AND GO:  Was the test performed? No   Length of time to ambulate 10 feet: N/A sec.   Gait slow and steady without use of assistive device  Cognitive Function:        03/19/2022    1:24 PM  6CIT Screen  What Year? 0 points  What month? 0 points  What time? 0 points  Count back from 20 0 points  Months in reverse 0 points  Repeat phrase 0 points  Total Score 0 points    Immunizations Immunization History  Administered Date(s) Administered   Influenza-Unspecified 05/15/2021   PFIZER(Purple Top)SARS-COV-2 Vaccination 10/08/2019, 11/02/2019   PNEUMOCOCCAL CONJUGATE-20 05/15/2021  Zoster Recombinat (Shingrix) 02/06/2022    TDAP status: Up to date  Flu Vaccine status: Due, Education has been provided regarding the importance of this vaccine.  Advised may receive this vaccine at local pharmacy or Health Dept. Aware to provide a copy of the vaccination record if obtained from local pharmacy or Health Dept. Verbalized acceptance and understanding.  Pneumococcal vaccine status: Due, Education has been provided regarding the importance of this vaccine. Advised may receive this vaccine at local pharmacy or Health Dept. Aware to provide a copy of the vaccination record if obtained from local pharmacy or Health Dept. Verbalized acceptance and understanding.  Covid-19 vaccine status: Completed vaccines  Qualifies for Shingles Vaccine? No   Zostavax completed No   Shingrix Completed?: Yes  Screening Tests Health Maintenance  Topic Date Due   COLONOSCOPY (Pts 45-8yrs Insurance coverage will need to be confirmed)  Never done   COVID-19 Vaccine (3 - Pfizer risk series) 11/30/2019   INFLUENZA VACCINE  02/06/2022   Zoster Vaccines- Shingrix (2 of 2) 04/03/2022   COLON CANCER SCREENING ANNUAL FOBT  05/18/2022   TETANUS/TDAP  08/09/2024   Hepatitis C Screening  Completed   HIV Screening  Completed   HPV VACCINES  Aged Out    Health Maintenance  Health Maintenance Due  Topic Date Due   COLONOSCOPY (Pts 45-21yrs Insurance coverage will need to be confirmed)  Never done   COVID-19 Vaccine (3 - Pfizer risk series) 11/30/2019   INFLUENZA VACCINE  02/06/2022    Colorectal cancer screening: Type of screening: FOBT/FIT. Completed 05/18/2021. Repeat every 1 years  Lung Cancer Screening: (Low Dose CT Chest recommended if Age 64-80 years, 30 pack-year currently smoking OR have quit w/in 15years.) does not qualify.   Lung Cancer Screening Referral: N/A  Additional Screening:  Hepatitis C Screening: does not qualify; Completed 09/09/2018  Vision Screening: Recommended annual ophthalmology exams for early detection of glaucoma and other disorders of the eye. Is the patient up to date with their annual eye exam?  No  Who is the provider or  what is the name of the office in which the patient attends annual eye exams? N/A If pt is not established with a provider, would they like to be referred to a provider to establish care? No .   Dental Screening: Recommended annual dental exams for proper oral hygiene  Community Resource Referral / Chronic Care Management: CRR required this visit?  No   CCM required this visit?  No      Plan:     I have personally reviewed and noted the following in the patient's chart:   Medical and social history Use of alcohol, tobacco or illicit drugs  Current medications and supplements including opioid prescriptions. Patient is not currently taking opioid prescriptions. Functional ability and status Nutritional status Physical activity Advanced directives List of other physicians Hospitalizations, surgeries, and ER visits in previous 12 months Vitals Screenings to include cognitive, depression, and falls Referrals and appointments  In addition, I have reviewed and discussed with patient certain preventive protocols, quality metrics, and best practice recommendations. A written personalized care plan for preventive services as well as general preventive health recommendations were provided to patient.     Cala Bradford, [facility]   03/19/2022   Nurse Notes: Non Face-To-Face 11 minute visit  Mr. Olgin , Thank you for taking time to come for your Medicare Wellness Visit. I appreciate your ongoing commitment to your health goals. Please review the following plan we discussed and  let me know if I can assist you in the future.   These are the goals we discussed:  Goals   None     This is a list of the screening recommended for you and due dates:  Health Maintenance  Topic Date Due   Colon Cancer Screening  Never done   COVID-19 Vaccine (3 - Pfizer risk series) 11/30/2019   Flu Shot  02/06/2022   Zoster (Shingles) Vaccine (2 of 2) 04/03/2022   Stool Blood Test  05/18/2022   Tetanus  Vaccine  08/09/2024   Hepatitis C Screening: USPSTF Recommendation to screen - Ages 18-79 yo.  Completed   HIV Screening  Completed   HPV Vaccine  Aged Out

## 2022-03-22 ENCOUNTER — Other Ambulatory Visit: Payer: Self-pay | Admitting: Student in an Organized Health Care Education/Training Program

## 2022-03-22 ENCOUNTER — Other Ambulatory Visit (HOSPITAL_COMMUNITY): Payer: Self-pay

## 2022-03-23 ENCOUNTER — Other Ambulatory Visit (HOSPITAL_COMMUNITY): Payer: Self-pay

## 2022-03-23 DIAGNOSIS — F310 Bipolar disorder, current episode hypomanic: Secondary | ICD-10-CM | POA: Diagnosis not present

## 2022-03-23 DIAGNOSIS — F1510 Other stimulant abuse, uncomplicated: Secondary | ICD-10-CM | POA: Diagnosis not present

## 2022-03-23 DIAGNOSIS — F1290 Cannabis use, unspecified, uncomplicated: Secondary | ICD-10-CM | POA: Diagnosis not present

## 2022-03-23 DIAGNOSIS — F1120 Opioid dependence, uncomplicated: Secondary | ICD-10-CM | POA: Diagnosis not present

## 2022-03-23 MED ORDER — TESTOSTERONE CYPIONATE 200 MG/ML IM SOLN
400.0000 mg | INTRAMUSCULAR | 1 refills | Status: DC
  Filled 2022-03-23 – 2022-03-28 (×3): qty 4, 28d supply, fill #0
  Filled 2022-04-11 – 2022-04-27 (×2): qty 4, 28d supply, fill #1

## 2022-03-23 MED ORDER — MIRTAZAPINE 15 MG PO TABS
15.0000 mg | ORAL_TABLET | Freq: Every day | ORAL | 1 refills | Status: DC
  Filled 2022-03-23: qty 30, 30d supply, fill #0

## 2022-03-23 MED ORDER — LAMOTRIGINE 100 MG PO TABS
100.0000 mg | ORAL_TABLET | Freq: Every day | ORAL | 1 refills | Status: DC
  Filled 2022-03-23: qty 30, 30d supply, fill #0

## 2022-03-26 ENCOUNTER — Emergency Department (HOSPITAL_COMMUNITY)
Admission: EM | Admit: 2022-03-26 | Discharge: 2022-03-26 | Disposition: A | Discharge status: Home / Self Care | Payer: Medicare HMO | Attending: Emergency Medicine | Admitting: Emergency Medicine

## 2022-03-26 ENCOUNTER — Other Ambulatory Visit: Payer: Self-pay

## 2022-03-26 ENCOUNTER — Emergency Department (HOSPITAL_COMMUNITY): Payer: Medicare HMO

## 2022-03-26 DIAGNOSIS — R0789 Other chest pain: Secondary | ICD-10-CM | POA: Insufficient documentation

## 2022-03-26 DIAGNOSIS — R079 Chest pain, unspecified: Secondary | ICD-10-CM | POA: Diagnosis not present

## 2022-03-26 DIAGNOSIS — R531 Weakness: Secondary | ICD-10-CM | POA: Diagnosis not present

## 2022-03-26 DIAGNOSIS — R61 Generalized hyperhidrosis: Secondary | ICD-10-CM | POA: Insufficient documentation

## 2022-03-26 DIAGNOSIS — Z79899 Other long term (current) drug therapy: Secondary | ICD-10-CM | POA: Diagnosis not present

## 2022-03-26 DIAGNOSIS — I10 Essential (primary) hypertension: Secondary | ICD-10-CM | POA: Diagnosis not present

## 2022-03-26 LAB — CBC
HCT: 46.7 % (ref 39.0–52.0)
Hemoglobin: 16.2 g/dL (ref 13.0–17.0)
MCH: 30.7 pg (ref 26.0–34.0)
MCHC: 34.7 g/dL (ref 30.0–36.0)
MCV: 88.6 fL (ref 80.0–100.0)
Platelets: 157 10*3/uL (ref 150–400)
RBC: 5.27 MIL/uL (ref 4.22–5.81)
RDW: 13.5 % (ref 11.5–15.5)
WBC: 5.5 10*3/uL (ref 4.0–10.5)
nRBC: 0 % (ref 0.0–0.2)

## 2022-03-26 LAB — BASIC METABOLIC PANEL
Anion gap: 9 (ref 5–15)
BUN: 22 mg/dL — ABNORMAL HIGH (ref 6–20)
CO2: 27 mmol/L (ref 22–32)
Calcium: 9.9 mg/dL (ref 8.9–10.3)
Chloride: 103 mmol/L (ref 98–111)
Creatinine, Ser: 0.69 mg/dL (ref 0.61–1.24)
GFR, Estimated: >60 mL/min (ref >60)
Glucose, Bld: 96 mg/dL (ref 70–99)
Potassium: 3.5 mmol/L (ref 3.5–5.1)
Sodium: 139 mmol/L (ref 135–145)

## 2022-03-26 LAB — TROPONIN I (HIGH SENSITIVITY)
Troponin I (High Sensitivity): 13 ng/L (ref <18)
Troponin I (High Sensitivity): 10 ng/L (ref <18)

## 2022-03-26 NOTE — ED Notes (Signed)
DC instructions reviewed with pt. PT verbalized understanding. PT DC °

## 2022-03-26 NOTE — ED Triage Notes (Signed)
Pt BIB [facility] for sudden onsset of  CP radiating to left arm onset approx. 8:250 am while in a meeting at work. PT became weak and diaphoretic. Pt is A&O x4 on arrival.  VSS.

## 2022-03-26 NOTE — Discharge Instructions (Signed)
As we discussed your work-up today does not show any evidence of active ongoing chest pain, heart disease, or new pneumonia, I recommend that you contact your cardiologist to see if they would like to follow-up with you sooner than your scheduled appointment, please return to the emergency department if you have new or worsening chest pain, especially with chest pressure, nausea, vomiting, radiation to your arm, chest pain with exertion.

## 2022-03-26 NOTE — ED Provider Notes (Signed)
[facility] Provider Note   CSN: [id number] Arrival date & time: 03/26/22  0856     History  Chief Complaint  Patient presents with   Chest Pain    Joseph Ayala is a 56 y.o. male with past medical history significant for previous alcohol and substance abuse without IV drug use, hypertension, hyperlipidemia, with history of thoracic aortic aneurysm, necrotizing pneumonia, necrotizing myopathy who presents with concern for sudden onset of sharp chest pain radiating to left arm approximately 830 this morning while in an AA meeting.  Patient reports that he felt weak, diaphoretic.  He reports that the pain lasted for around 3 to 5 minutes before resolving spontaneously.  Patient reports no pain or shortness of breath, weakness, diaphoresis at this time.  He reports that he did not feel particularly stressed or out of his ordinary self at the meeting this morning.  He denies any recent chest pain with exertion.  He does report 1 episode of similar sharp chest pain around 2 to 3 days ago, also without known inciting incident.   Chest Pain      Home Medications Prior to Admission medications   Medication Sig Start Date End Date Taking? Authorizing Provider  acetaminophen (TYLENOL) 500 MG tablet Take 1,500 mg by mouth every morning.   Yes [provider]  Ascorbic Acid (VITAMIN C) 100 MG tablet Take 100 mg by mouth daily.   Yes [provider]  Buprenorphine HCl-Naloxone HCl 8-2 MG FILM Place 1 Film under the tongue in the morning, at noon, and at bedtime. 03/14/22  Yes Masters, Katie, DO  chlorthalidone (HYGROTON) 25 MG tablet Take 1 tablet (25 mg total) by mouth daily. 03/14/22  Yes Masters, Katie, DO  ciclopirox (PENLAC) 8 % solution Apply topically at bedtime. Apply over nail and surrounding skin. Apply daily over previous coat. After seven (7) days, may remove with alcohol and continue cycle. 03/16/22  Yes Tyson AliasVincent, Duncan Thomas, MD   ezetimibe (ZETIA) 10 MG tablet Take 1 tablet (10 mg total) by mouth daily. 08/24/21  Yes Tyson AliasVincent, Duncan Thomas, MD  ibuprofen (ADVIL) 200 MG tablet Take 600 mg by mouth every morning.   Yes [provider]  lamoTRIgine (LAMICTAL) 100 MG tablet Take 1 tablet by mouth everyday 03/23/22  Yes   metoprolol succinate (TOPROL-XL) 50 MG 24 hr tablet Take 1 tablet (50 mg total) by mouth daily. Take with or immediately following a meal. 08/21/21  Yes Tyson AliasVincent, Duncan Thomas, MD  mirtazapine (REMERON) 15 MG tablet Take 1 tablet (15 mg total) by mouth at bedtime for sleep 03/23/22  Yes   Multiple Vitamin (MULTIVITAMIN WITH MINERALS) TABS tablet Take 1 tablet by mouth daily.   Yes [provider]  sildenafil (VIAGRA) 25 MG tablet Take 1 tablet (25 mg total) by mouth as needed for erectile dysfunction. 06/19/21 06/19/22 Yes Tyson AliasVincent, Duncan Thomas, MD  testosterone cypionate (DEPOTESTOSTERONE CYPIONATE) 200 MG/ML injection Inject 2 mLs (400 mg total) into the muscle every 14 (fourteen) days. 03/23/22  Yes Tyson AliasVincent, Duncan Thomas, MD  zinc gluconate 50 MG tablet Take 50 mg by mouth daily.   Yes [provider]  Buprenorphine HCl-Naloxone HCl 8-2 MG FILM Place 1 Film under the tongue in the morning, at noon, and at bedtime. Patient not taking: Reported on 03/26/2022 03/28/22   Masters, Florentina AddisonKatie, DO  lamoTRIgine (LAMICTAL) 25 MG tablet Take 1 tablet (25 mg total) by mouth daily for 14 days, THEN 2 tablets (50 mg total)  daily thereafter. Patient not taking: Reported on 03/26/2022 02/28/22 04/12/22    traZODone (DESYREL) 50 MG tablet Take 1-2 tablets (50-100 mg total) by mouth at bedtime as needed for sleep Patient not taking: Reported on 03/26/2022 02/02/22         Allergies    Lyrica cr [pregabalin er], Vicodin [hydrocodone-acetaminophen], and Quinolones    Review of Systems   Review of Systems  Cardiovascular:  Positive for chest pain.  All other systems reviewed and are negative.   Physical  Exam Updated Vital Signs BP 116/80   Pulse 83   Temp 98 F (36.7 C) (Oral)   Resp 16   SpO2 98%  Physical Exam Vitals and nursing note reviewed.  Constitutional:      General: He is not in acute distress.    Appearance: Normal appearance. He is not ill-appearing or toxic-appearing.  HENT:     Head: Normocephalic and atraumatic.  Eyes:     General:        Right eye: No discharge.        Left eye: No discharge.  Cardiovascular:     Rate and Rhythm: Normal rate and regular rhythm.     Heart sounds: No murmur heard.    No friction rub. No gallop.  Pulmonary:     Effort: Pulmonary effort is normal.     Breath sounds: Normal breath sounds.     Comments: No respiratory distress, clear breath sounds bilaterally, no wheezing, rhonchi, stridor, rales. Chest:     Comments: No ttp of chest wall Abdominal:     General: Bowel sounds are normal.     Palpations: Abdomen is soft.  Skin:    General: Skin is warm and dry.     Capillary Refill: Capillary refill takes less than 2 seconds.  Neurological:     Mental Status: He is alert and oriented to person, place, and time.  Psychiatric:        Mood and Affect: Mood normal.        Behavior: Behavior normal.     ED Results / Procedures / Treatments   Labs (all labs ordered are listed, but only abnormal results are displayed) Labs Reviewed  BASIC METABOLIC PANEL - Abnormal; Notable for the following components:      Result Value   BUN 22 (*)    All other components within normal limits  CBC  TROPONIN I (HIGH SENSITIVITY)  TROPONIN I (HIGH SENSITIVITY)    EKG EKG Interpretation  Date/Time:  Monday March 26 2022 09:11:20 EDT Ventricular Rate:  88 PR Interval:  170 QRS Duration: 88 QT Interval:  364 QTC Calculation: 440 R Axis:   17 Text Interpretation: Normal sinus rhythm Normal ECG  No significant change from prior EKG Confirmed by Arturo Morton ([id number]) on 03/26/2022 11:48:33 AM  Radiology DG Chest 2  View  Result Date: 03/26/2022 CLINICAL DATA:  56 year old male with chest pain radiating to the left arm onset this morning. EXAM: CHEST - 2 VIEW COMPARISON:  Chest CT 05/01/2021 and earlier. FINDINGS: Upright PA and lateral views at 1002 hours. Lower lung volumes compared to prior normal exams. Normal cardiac size and mediastinal contours. Visualized tracheal air column is within normal limits. Mild crowding of lung markings petechial E at the lung bases. Otherwise both lungs appear clear. No pneumothorax or pleural effusion. No acute osseous abnormality identified. Negative visible bowel gas. IMPRESSION: Low lung volumes.  No other acute cardiopulmonary abnormality. Electronically Signed   By: Odessa Fleming  M.D.   On: 03/26/2022 10:22    Procedures Procedures    Medications Ordered in ED Medications - No data to display  ED Course/ Medical Decision Making/ A&P                            Medical Decision Making  This patient is a 56 y.o. male who presents to the ED for concern of chest pain, this involves an extensive number of treatment options, and is a complaint that carries with it a high risk of complications and morbidity. The emergent differential diagnosis prior to evaluation includes, but is not limited to,  ACS, AAS, PE, Mallory-Weiss, Boerhaave's, Pneumonia, acute bronchitis, asthma or COPD exacerbation, anxiety, MSK pain or traumatic injury to the chest, acid reflux versus other .   This is not an exhaustive differential.   Past Medical History / Co-morbidities / Social History: previous alcohol and substance abuse without IV drug use, hypertension, hyperlipidemia, with history of thoracic aortic aneurysm, necrotizing pneumonia, necrotizing myopathy  Additional history: Chart reviewed. Pertinent results include: Reviewed outpatient CT surgery, cardiology, internal medicine notes, including lab work, imaging  Physical Exam: Physical exam performed. The pertinent findings include: On  my exam patient is in no acute distress, not having active chest pain at this time, no focal consolidation, accessory breath sounds, on tenderness palpation of chest wall, or unstable vital signs.  Lab Tests: I ordered, and personally interpreted labs.  The pertinent results include: BMP unremarkable other than mildly elevated BUN, CBC troponin negative x2, no ongoing chest pain.   Imaging Studies: I ordered imaging studies including plain film chest x-ray. I independently visualized and interpreted imaging which showed no signs of intrathoracic abnormality. I agree with the radiologist interpretation.   Cardiac Monitoring:  The patient was maintained on a cardiac monitor.  My attending physician Dr. Dayna Ramus viewed and interpreted the cardiac monitored which showed an underlying rhythm of: NSR. I agree with this interpretation.  Disposition: After consideration of the diagnostic results and the patients response to treatment, I feel that patient is stable for discharge at this time, he does have an extensive cardiac and pulmonary history with previous necrotizing myopathy, necrotizing pneumonia, which predisposes him likely to occasional pain secondary to healing, scar tissue, no other intrathoracic abnormality maladies including inflammation, but no evidence of acute worsening of symptoms at this time, no active chest pain during the entirety of his stay no evidence of ischemia on EKG.  Patient is already seen evaluated by CT surgery and cardiology, I recommended that he contact them in the morning and see whether they would like to follow-up with him sooner than his scheduled appointment in around 1 month..   emergency department workup does not suggest an emergent condition requiring admission or immediate intervention beyond what has been performed at this time. The plan is: as above. The patient is safe for discharge and has been instructed to return immediately for worsening symptoms, change in  symptoms or any other concerns.  I discussed this case with my attending physician Dr. Dayna Ramus who cosigned this note including patient's presenting symptoms, physical exam, and planned diagnostics and interventions. Attending physician stated agreement with plan or made changes to plan which were implemented.    Final Clinical Impression(s) / ED Diagnoses Final diagnoses:  Chest wall pain    Rx / DC Orders ED Discharge Orders     None         Braidon Chermak,  Joesph Fillers, PA-C 03/26/22 1500    Ottie Glazier, [place] 03/26/22 1541

## 2022-03-27 ENCOUNTER — Other Ambulatory Visit (HOSPITAL_COMMUNITY): Payer: Self-pay

## 2022-03-27 ENCOUNTER — Telehealth: Payer: Self-pay

## 2022-03-27 ENCOUNTER — Telehealth: Payer: Self-pay | Admitting: Cardiology

## 2022-03-27 NOTE — Telephone Encounter (Signed)
He has not been seen since 11/2020, can we schedule him a follow-up for sometime in the next few weeks?

## 2022-03-27 NOTE — Telephone Encounter (Signed)
Returned the call to the patient. He stated that he went to the ED for chest pain yesterday. It was advised that he follow up with cardiology or [facility]CTS sooner if needed. The patient was calling to follow up on if he needs to be seen sooner. The patient is currently asymptomatic.  Patient is already seen evaluated by CT surgery and cardiology, I recommended that he contact them in the morning and see whether they would like to follow-up with him sooner than his scheduled appointment in around 1 month.Marland Kitchen

## 2022-03-27 NOTE — Patient Outreach (Signed)
  Care Coordination TOC Note Transition Care Management Follow-up Telephone Call Date of discharge and from where: [facility] 03/26/22 How have you been since you were released from the hospital? "I am feeling pretty good today, this was the third time in last few weeks, I have had the chest pain and then it goes away".  Any questions or concerns? No  Items Reviewed: Did the pt receive and understand the discharge instructions provided? Yes  Medications obtained and verified? Yes  Other? No  Any new allergies since your discharge? No  Dietary orders reviewed? No Do you have support at home? Yes   Home Care and Equipment/Supplies: Were home health services ordered? no If so, what is the name of the agency? N/A  Has the agency set up a time to come to the patient's home? not applicable Were any new equipment or medical supplies ordered?  No What is the name of the medical supply agency? N/A Were you able to get the supplies/equipment? not applicable Do you have any questions related to the use of the equipment or supplies? No  Functional Questionnaire: (I = Independent and D = Dependent) ADLs: I  Bathing/Dressing- I  Meal Prep- I  Eating- I  Maintaining continence- I  Transferring/Ambulation- I  Managing Meds- I  Follow up appointments reviewed:  PCP Hospital f/u appt confirmed? Yes  Scheduled to see Dr. Evette Doffing on 04/16/22 @ 10:45. [facility] f/u appt confirmed? No   Are transportation arrangements needed? No  If their condition worsens, is the pt aware to call PCP or go to the Emergency Dept.? Yes Was the patient provided with contact information for the PCP's office or ED? Yes Was to pt encouraged to call back with questions or concerns? Yes  SDOH assessments and interventions completed:   Yes  Care Coordination Interventions Activated:  Yes   Care Coordination Interventions:  No Care Coordination interventions needed at this time.   Encounter Outcome:   Pt. Visit Completed

## 2022-03-27 NOTE — Telephone Encounter (Signed)
Pt is requesting call back in regards to his recent ED visit 09/18. He states he would like to discuss further tests he would like ordered and more medication added added. Please advise.

## 2022-03-28 ENCOUNTER — Other Ambulatory Visit (HOSPITAL_COMMUNITY): Payer: Self-pay

## 2022-03-29 NOTE — Telephone Encounter (Signed)
Appt scheduled/ patient aware.

## 2022-04-02 NOTE — Progress Notes (Signed)
Internal Medicine Clinic Attending  Case and documentation of Dr. Masters  soon after the resident saw the patient reviewed.  I reviewed the AWV findings.  I agree with the assessment, diagnosis, and plan of care documented in the AWV note.     

## 2022-04-09 NOTE — Progress Notes (Unsigned)
Cardiology Office Note:    Date:  04/11/2022   ID:  Joseph Ayala, DOB [birth year], MRN [id number]  PCP:  [name], MD  Cardiologist:  Donato Heinz, MD  Electrophysiologist:  None   Referring MD: [name]   No chief complaint on file.   History of Present Illness:    Joseph Ayala is a 56 y.o. male with a hx of hypertension, necrotizing myopathy, opioid use disorder on Suboxone, alcohol abuse,  He was admitted on 08/27/2020 with shortness of breath.  Found to be hypoxic, chest x-ray showed right lower lobe pneumonia.  CTPA showed no PE but dense area of consolidation and groundglass opacity in right middle and lower lobes.  Developed worsening hypoxia requiring intubation on 2/20.  Was found to have septic shock requiring pressor support.  Respiratory cultures grew MRSA.  Hospital course was complicated by atrial fibrillation, which is a new diagnosis.  Echocardiogram on 2/22 showed EF 35 to 40%, mild RV dysfunction.  He was started on amiodarone and converted to normal sinus rhythm on 2/22.  He required chest tube placement for right pleural effusion.  He was admitted from 2/19 through 09/29/2020.  He required tracheostomy but was removed on 3/22 he was discharged on oral linezolid and MRSA pneumonia.  He was discharged on metoprolol, Lasix, and lisinopril.  TEE on 09/16/2020 showed no evidence of endocarditis.  Likely small PFO.  EF 45 to 50%, low normal RV function.  Also noted on CT to have 4.6 cm thoracic aortic aneurysm.  Cardiac monitor x2 weeks showed no significant arrhythmias on 12/20/2020.  Echocardiogram 12/25/2021 showed EF 60 to 65%, normal RV function, no significant valvular disease, ascending aortic dilatation measuring 43 mm.  Since last clinic visit, he reports he has been having chest pain.  Describes as sharp stabbing pain on left side of chest that radiates to left arm, lasts for up to 15 seconds.  Can occur when he exerts himself but also  does happen with rest.  Also with dyspnea on exertion.  Also is having palpitations nearly daily where feels like heart is racing.  Past Medical History:  Diagnosis Date   Acute HFmrEF (heart failure with mildly reduced ejection fraction) ([place]) 10/31/2020   Acute hypoxemic respiratory failure (HCC)    Acute respiratory failure with hypoxia ([place]) 08/28/2020   CAP (community acquired pneumonia) 08/28/2020   Degenerative disc disease, lumbar    Dental crown present    ETOH abuse    History of kidney stones    Hypertension    states under control with meds., has been on med. x 1 yr. - is currently out of med., to see PCP 02/06/2018   Marijuana use, continuous    MRSA pneumonia ([employer]) 09/09/2020   Muscle atrophy 01/2018   Muscle weakness 01/2018   Necrotizing myopathy    Necrotizing pneumonia ([place]) 09/09/2020   Neuropathic pain    Opioid use disorder    PAF (paroxysmal atrial fibrillation) (HCC)    Pleural effusion    Sepsis ([place]) 08/28/2020   Thoracic aortic aneurysm (TAA) ([place])    a. seen on CT 08/2020.    Past Surgical History:  Procedure Laterality Date   ENDOVENOUS ABLATION SAPHENOUS VEIN W/ LASER Right 12-29-2015   endovenous laser ablation right greater saphenous vein, stab phlebectomy > 20 incisions right leg, sclerotherapy right leg by Curt Jews MD     MINOR MUSCLE BIOPSY Right 02/11/2018   Procedure: RECTUS FEMORIS MUSCLE BIOPSY;  Surgeon:  Armandina Gemma, MD;  Location: [facility];  Service: General;  Laterality: Right;   MUSCLE BIOPSY Right 02/11/2018   Procedure: DELTOID MUSCLE BIOPSY;  Surgeon: Armandina Gemma, MD;  Location: [facility];  Service: General;  Laterality: Right;   NASAL FRACTURE SURGERY     TRACHEOSTOMY TUBE PLACEMENT N/A 09/14/2020   Procedure: TRACHEOSTOMY;  Surgeon: Melissa Montane, MD;  Location: [facility];  Service: ENT;  Laterality: N/A;    Current Medications: Current Meds  Medication Sig   acetaminophen (TYLENOL) 500 MG tablet Take 1,500 mg  by mouth every morning.   Ascorbic Acid (VITAMIN C) 100 MG tablet Take 100 mg by mouth daily.   Buprenorphine HCl-Naloxone HCl 8-2 MG FILM Place 1 Film under the tongue in the morning, at noon, and at bedtime.   chlorthalidone (HYGROTON) 25 MG tablet Take 1 tablet (25 mg total) by mouth daily.   ciclopirox (PENLAC) 8 % solution Apply topically at bedtime. Apply over nail and surrounding skin. Apply daily over previous coat. After seven (7) days, may remove with alcohol and continue cycle.   ezetimibe (ZETIA) 10 MG tablet Take 1 tablet (10 mg total) by mouth daily.   ibuprofen (ADVIL) 200 MG tablet Take 600 mg by mouth every morning.   lamoTRIgine (LAMICTAL) 100 MG tablet Take 1 tablet by mouth everyday   metoprolol succinate (TOPROL-XL) 50 MG 24 hr tablet Take 1 tablet (50 mg total) by mouth daily. Take with or immediately following a meal.   metoprolol tartrate (LOPRESSOR) 100 MG tablet Take 1 tablet (100mg ) TWO hours prior to CT scan   Multiple Vitamin (MULTIVITAMIN WITH MINERALS) TABS tablet Take 1 tablet by mouth daily.   sildenafil (VIAGRA) 25 MG tablet Take 1 tablet (25 mg total) by mouth as needed for erectile dysfunction.   testosterone cypionate (DEPOTESTOSTERONE CYPIONATE) 200 MG/ML injection Inject 2 mLs (400 mg total) into the muscle every 14 (fourteen) days.   traZODone (DESYREL) 50 MG tablet Take 1-2 tablets (50-100 mg total) by mouth at bedtime as needed for sleep   zinc gluconate 50 MG tablet Take 50 mg by mouth daily.     Allergies:   Lyrica cr [pregabalin er], Vicodin [hydrocodone-acetaminophen], and Quinolones   Social History   Socioeconomic History   Marital status: Married    Spouse name: Not on file   Number of children: 1   Years of education: 13   Highest education level: Some college, no degree  Occupational History   Occupation: not working  Tobacco Use   Smoking status: Never   Smokeless tobacco: Former  Scientific laboratory technician Use: Never used  Substance and  Sexual Activity   Alcohol use: Not Currently    Comment: occasionally   Drug use: No   Sexual activity: Yes    Partners: Female    Birth control/protection: None  Other Topics Concern   Not on file  Social History Narrative   Lives with wife and daughter in a one story home.  Has one child.  Currently not working.  Education: some college.    Social Determinants of Health   Financial Resource Strain: Low Risk  (03/19/2022)   Overall Financial Resource Strain (CARDIA)    Difficulty of Paying Living Expenses: Not hard at all  Food Insecurity: No Food Insecurity (03/19/2022)   Hunger Vital Sign    Worried About Running Out of Food in the Last Year: Never true    Ran Out of Food in the Last  Year: Never true  Transportation Needs: No Transportation Needs (03/27/2022)   PRAPARE - Hydrologist (Medical): No    Lack of Transportation (Non-Medical): No  Physical Activity: Inactive (03/19/2022)   Exercise Vital Sign    Days of Exercise per Week: 0 days    Minutes of Exercise per Session: 0 min  Stress: No Stress Concern Present (03/19/2022)   [place]    Feeling of Stress : Not at all  Social Connections: Moderately Isolated (03/19/2022)   Social Connection and Isolation Panel [NHANES]    Frequency of Communication with Friends and Family: Twice a week    Frequency of Social Gatherings with Friends and Family: Twice a week    Attends Religious Services: Never    Marine scientist or Organizations: No    Attends Music therapist: Never    Marital Status: Married     Family History: The patient's family history includes Drug abuse in his brother; Varicose Veins in his brother.  ROS:   Please see the history of present illness.     All other systems reviewed and are negative.  EKGs/Labs/Other Studies Reviewed:    The following studies were reviewed today:   EKG:    04/11/2022: Normal sinus rhythm, rate 90 LVH  11/10/2020: NSR. Rate 89 bpm. LVH.  Echo 08/30/20: 1. Left ventricular ejection fraction, by estimation, is 35 to 40%. The  left ventricle has moderately decreased function. The left ventricle  demonstrates global hypokinesis. The left ventricular internal cavity size  was moderately dilated. Left  ventricular diastolic parameters are indeterminate.   2. Right ventricular systolic function is mildly reduced. The right  ventricular size is moderately enlarged. There is mildly elevated  pulmonary artery systolic pressure.   3. Left atrial size was mildly dilated.   4. Right atrial size was moderately dilated.   5. The mitral valve is normal in structure. Mild mitral valve  regurgitation. No evidence of mitral stenosis.   6. The aortic valve has an indeterminant number of cusps. There is mild  calcification of the aortic valve. Aortic valve regurgitation is not  visualized. Mild aortic valve sclerosis is present, with no evidence of  aortic valve stenosis.   7. Aortic dilatation noted. There is mild to moderate dilatation of the  ascending aorta, measuring 44 mm.   8. The inferior vena cava is dilated in size with <50% respiratory  variability, suggesting right atrial pressure of 15 mmHg.   Lower Venous DVT Study 08/30/20: RIGHT:  - There is no evidence of deep vein thrombosis in the lower extremity.     - No cystic structure found in the popliteal fossa.  - Triphasic waveforms are noted in the posterior tibial, peroneal, and  anterior tibial arteries.     LEFT:  - There is no evidence of deep vein thrombosis in the lower extremity.     - No cystic structure found in the popliteal fossa.  - Triphasic waveforms are noted in the posterior tibial, peroneal, and  anterior tibial arteries.   TEE 09/16/20:  1. Left ventricular ejection fraction, by estimation, is 45 to 50%. The  left ventricle has mildly decreased function.   2. Right  ventricular systolic function is low normal. The right  ventricular size is normal.   3. No left atrial/left atrial appendage thrombus was detected.   4. The mitral valve is normal in structure. Trivial mitral valve  regurgitation. No evidence of mitral stenosis.   5. The aortic valve is tricuspid. Aortic valve regurgitation is not  visualized. No aortic stenosis is present.   6. Evidence of atrial level shunting detected by color flow Doppler.  There is a small patent foramen ovale with predominantly left to right  shunting across the atrial septum.  LOWER EXTREMITY DOPPLER STUDY 10/12/20: Right: Resting right ankle-brachial index indicates noncompressible right  lower extremity arteries.   Waveforms are strong triphasic.  The right toe-brachial index is normal.   Left: Resting left ankle-brachial index indicates noncompressible left  lower extremity arteries.   Waveforms are strong triphasic.  The left toe-brachial index is normal.  Recent Labs: 08/21/2021: ALT 128 09/20/2021: B Natriuretic Peptide 6.7 03/26/2022: BUN 22; Creatinine, Ser 0.69; Hemoglobin 16.2; Platelets 157; Potassium 3.5; Sodium 139  Recent Lipid Panel    Component Value Date/Time   CHOL 260 (H) 08/21/2021 1040   TRIG 217 (H) 08/21/2021 1040   HDL 57 08/21/2021 1040   CHOLHDL 4.6 08/21/2021 1040   LDLCALC 163 (H) 08/21/2021 1040    Physical Exam:    VS:  BP 116/84   Pulse (!) 106   Ht 5\' 11"  (1.803 m)   Wt 207 lb 9.6 oz (94.2 kg)   SpO2 99%   BMI 28.95 kg/m     Wt Readings from Last 3 Encounters:  04/11/22 207 lb 9.6 oz (94.2 kg)  03/14/22 211 lb 9.6 oz (96 kg)  01/01/22 205 lb 1.6 oz (93 kg)     GEN: Well nourished, well developed in no acute distress HEENT: Normal NECK: No JVD; No carotid bruits LYMPHATICS: No lymphadenopathy CARDIAC: RRR, no murmurs, rubs, gallops RESPIRATORY:  Clear to auscultation without rales, wheezing or rhonchi  ABDOMEN: Soft, non-tender,  non-distended MUSCULOSKELETAL:  No edema; No deformity  SKIN: Warm and dry NEUROLOGIC:  Alert and oriented x 3 PSYCHIATRIC:  Normal affect   ASSESSMENT:    1. Chest pain of uncertain etiology   2. Atrial fibrillation, unspecified type ([place])   3. Acute combined systolic (congestive) and diastolic (congestive) heart failure ([place])   4. Thoracic aortic aneurysm without rupture, unspecified part ([place])   5. Essential hypertension     PLAN:    Chest pain: Atypical in description but does report can occur with exertion, suggesting possible angina.  Recommend coronary CTA to rule out obstructive CAD.  Will give Lopressor 100 mg prior to study  Atrial fibrillation: Occurred in setting of acute illness.  Started on Eliquis 5 mg twice daily -Suspect A. fib was due to critical illness and will likely not need long-term anticoagulation.  Cardiac monitor x2 weeks showed no significant arrhythmias on 12/20/2020.  Eliquis discontinued.   -He is reporting recent palpitations where feels like heart is racing, will check [employer] monitor x7 days   Acute combined systolic and diastolic heart failure: EF 35 to 40% 08/2020.  TEE 09/16/20 showed improvement in EF to 45 to 50%.  On metoprolol 12.5 mg twice daily, Lasix 40 mg daily.  Echocardiogram 12/25/2021 showed EF 60 to 65%, normal RV function, no significant valvular disease, ascending aortic dilatation measuring 43 mm.  Thoracic aortic aneurysm: Measured 4.6 cm.  Follows with thoracic surgery, most recent chest CT 04/2021 showed stable aneurysm.  Has upcoming CTA chest to follow, will change to coronart CTA to evaluate both the coronaries and aorta as above  Hypertension: on chlorthalidone 25 mg daily.  Appears controlled   RTC in 3 months  Medication Adjustments/Labs  and Tests Ordered: Current medicines are reviewed at length with the patient today.  Concerns regarding medicines are outlined above.  Orders Placed This Encounter  Procedures   CT  CORONARY MORPH W/CTA COR W/SCORE W/CA W/CM &/OR WO/CM   Basic metabolic panel   CARDIAC EVENT MONITOR   EKG 12-Lead   Meds ordered this encounter  Medications   metoprolol tartrate (LOPRESSOR) 100 MG tablet    Sig: Take 1 tablet (100mg ) TWO hours prior to CT scan    Dispense:  1 tablet    Refill:  0    Patient Instructions  Medication Instructions:  Your physician recommends that you continue on your current medications as directed. Please refer to the Current Medication list given to you today.  *If you need a refill on your cardiac medications before your next appointment, please call your pharmacy*   Lab Work: BMET today  If you have labs (blood work) drawn today and your tests are completely normal, you will receive your results only by: [place] (if you have MyChart) OR A paper copy in the mail If you have any lab test that is abnormal or we need to change your treatment, we will call you to review the results.   Testing/Procedures: Coronary CTA-see instructions below  [employer] Cardiac Event Monitor Instructions Your physician has requested you wear your cardiac event monitor for __7__ days, (1-30). [employer] may call or text to confirm a shipping address. The monitor will be sent to a land address via UPS. [employer] will not ship a monitor to a PO BOX. It typically takes 3-5 days to receive your monitor after it has been enrolled. [employer] will assist with USPS tracking if your package is delayed. The telephone number for [employer] is [phone]. Once you have received your monitor, please review the enclosed instructions. Instruction tutorials can also be viewed under help and settings on the enclosed cell phone. Your monitor has already been registered assigning a specific monitor serial # to you.  Applying the monitor Remove cell phone from case and turn it on. The cell phone works as Dealer and needs to be within [employer] of you at all  times. The cell phone will need to be charged on a daily basis. We recommend you plug the cell phone into the enclosed charger at your bedside table every night.  Monitor batteries: You will receive two monitor batteries labelled #1 and #2. These are your recorders. Plug battery #2 onto the second connection on the enclosed charger. Keep one battery on the charger at all times. This will keep the monitor battery deactivated. It will also keep it fully charged for when you need to switch your monitor batteries. A small light will be blinking on the battery emblem when it is charging. The light on the battery emblem will remain on when the battery is fully charged.  Open package of a Monitor strip. Insert battery #1 into black hood on strip and gently squeeze monitor battery onto connection as indicated in instruction booklet. Set aside while preparing skin.  Choose location for your strip, vertical or horizontal, as indicated in the instruction booklet. Shave to remove all hair from location. There cannot be any lotions, oils, powders, or colognes on skin where monitor is to be applied. Wipe skin clean with enclosed Saline wipe. Dry skin completely.  Peel paper labeled #1 off the back of the Monitor strip exposing the adhesive. Place the monitor on the chest in the vertical or horizontal  position shown in the instruction booklet. One arrow on the monitor strip must be pointing upward. Carefully remove paper labeled #2, attaching remainder of strip to your skin. Try not to create any folds or wrinkles in the strip as you apply it.  Firmly press and release the circle in the center of the monitor battery. You will hear a small beep. This is turning the monitor battery on. The heart emblem on the monitor battery will light up every 5 seconds if the monitor battery in turned on and connected to the patient securely. Do not push and hold the circle down as this turns the monitor battery off. The  cell phone will locate the monitor battery. A screen will appear on the cell phone checking the connection of your monitor strip. This may read poor connection initially but change to good connection within the next minute. Once your monitor accepts the connection you will hear a series of 3 beeps followed by a climbing crescendo of beeps. A screen will appear on the cell phone showing the two monitor strip placement options. Touch the picture that demonstrates where you applied the monitor strip.  Your monitor strip and battery are waterproof. You are able to shower, bathe, or swim with the monitor on. They just ask you do not submerge deeper than 3 feet underwater. We recommend removing the monitor if you are swimming in a lake, river, or ocean.  Your monitor battery will need to be switched to a fully charged monitor battery approximately once a week. The cell phone will alert you of an action which needs to be made.  On the cell phone, tap for details to reveal connection status, monitor battery status, and cell phone battery status. The green dots indicates your monitor is in good status. A red dot indicates there is something that needs your attention.  To record a symptom, click the circle on the monitor battery. In 30-60 seconds a list of symptoms will appear on the cell phone. Select your symptom and tap save. Your monitor will record a sustained or significant arrhythmia regardless of you clicking the button. Some patients do not feel the heart rhythm irregularities. [employer] will notify us of any serious or critical events.  Refer to instruction booklet for instructions on switching batteries, changing strips, the Do not disturb or Pause features, or any additional questions.  Call [employer] at [phone], to confirm your monitor is transmitting and record your baseline. They will answer any questions you may have regarding the monitor instructions at that  time.  Returning the monitor to [employer] Place all equipment back into blue box. Peel off strip of paper to expose adhesive and close box securely. There is a prepaid UPS shipping label on this box. Drop in a UPS drop box, or at a UPS facility like [employer]. You may also contact [employer] to arrange UPS to pick up monitor package at your home.   Follow-Up: At [facility], you and your health needs are our priority.  As part of our continuing mission to provide you with exceptional heart care, we have created designated Provider Care Teams.  These Care Teams include your primary Cardiologist (physician) and Advanced Practice Providers (APPs -  Physician Assistants and Nurse Practitioners) who all work together to provide you with the care you need, when you need it.  We recommend signing up for the patient portal called "MyChart".  Sign up information is provided on this After Visit Summary.  MyChart is used  to connect with patients for Virtual Visits (Telemedicine).  Patients are able to view lab/test results, encounter notes, upcoming appointments, etc.  Non-urgent messages can be sent to your provider as well.   To learn more about what you can do with MyChart, go to [email or web address].    Your next appointment:   3 month(s)  The format for your next appointment:   In Person  Provider:   Donato Heinz, MD     Other Instructions   Your cardiac CT will be scheduled at one of the below locations:   [facility] [address] [place], [place] [address] [phone]  If scheduled at [facility], please arrive at the [facility] and Children's Entrance (Entrance C2) of [facility] 30 minutes prior to test start time. You can use the FREE valet parking offered at entrance C (encouraged to control the heart rate for the test)  Proceed to the [facility] Radiology Department (first floor) to check-in and test prep.  All radiology  patients and guests should use entrance C2 at [facility], accessed from [facility], even though the hospital's physical address listed is [address].    Please follow these instructions carefully (unless otherwise directed):  Hold all erectile dysfunction medications at least 3 days (72 hrs) prior to test. (Ie viagra, cialis, sildenafil, tadalafil, etc) We will administer nitroglycerin during this exam.   On the Night Before the Test: Be sure to Drink plenty of water. Do not consume any caffeinated/decaffeinated beverages or chocolate 12 hours prior to your test. Do not take any antihistamines 12 hours prior to your test.  On the Day of the Test: Drink plenty of water until 1 hour prior to the test. Do not eat any food 1 hour prior to test. You may take your regular medications prior to the test.  Take metoprolol (Lopressor) two hours prior to test. Hold chlorthalidone AM of CT      After the Test: Drink plenty of water. After receiving IV contrast, you may experience a mild flushed feeling. This is normal. On occasion, you may experience a mild rash up to 24 hours after the test. This is not dangerous. If this occurs, you can take Benadryl 25 mg and increase your fluid intake. If you experience trouble breathing, this can be serious. If it is severe call 911 IMMEDIATELY. If it is mild, please call our office. If you take any of these medications: Glipizide/Metformin, Avandament, Glucavance, please do not take 48 hours after completing test unless otherwise instructed.  We will call to schedule your test 2-4 weeks out understanding that some insurance companies will need an authorization prior to the service being performed.   For non-scheduling related questions, please contact the cardiac imaging nurse navigator should you have any questions/concerns: Marchia Bond, Cardiac Imaging Nurse Navigator Gordy Clement, Cardiac Imaging Nurse Navigator [place]  [facility] Direct Office Dial: [phone]   For scheduling needs, including cancellations and rescheduling, please call [place], [phone].            Signed, Donato Heinz, MD  04/11/2022 9:38 AM    [place]

## 2022-04-10 ENCOUNTER — Other Ambulatory Visit: Payer: Self-pay | Admitting: Internal Medicine

## 2022-04-10 ENCOUNTER — Other Ambulatory Visit (HOSPITAL_COMMUNITY): Payer: Self-pay

## 2022-04-10 DIAGNOSIS — F1190 Opioid use, unspecified, uncomplicated: Secondary | ICD-10-CM

## 2022-04-10 NOTE — Telephone Encounter (Signed)
Last appointment 03/14/2022.  Next appointment 04/16/2022.  Last [employer] 01/01/2022.

## 2022-04-11 ENCOUNTER — Other Ambulatory Visit (HOSPITAL_COMMUNITY): Payer: Self-pay

## 2022-04-11 ENCOUNTER — Other Ambulatory Visit: Payer: Self-pay | Admitting: Internal Medicine

## 2022-04-11 ENCOUNTER — Encounter: Payer: Self-pay | Admitting: Cardiology

## 2022-04-11 ENCOUNTER — Ambulatory Visit: Payer: Medicare HMO | Attending: Cardiology | Admitting: Cardiology

## 2022-04-11 VITALS — BP 116/84 | HR 106 | Ht 71.0 in | Wt 207.6 lb

## 2022-04-11 DIAGNOSIS — R079 Chest pain, unspecified: Secondary | ICD-10-CM | POA: Diagnosis not present

## 2022-04-11 DIAGNOSIS — F1190 Opioid use, unspecified, uncomplicated: Secondary | ICD-10-CM

## 2022-04-11 DIAGNOSIS — I712 Thoracic aortic aneurysm, without rupture, unspecified: Secondary | ICD-10-CM

## 2022-04-11 DIAGNOSIS — I5041 Acute combined systolic (congestive) and diastolic (congestive) heart failure: Secondary | ICD-10-CM | POA: Diagnosis not present

## 2022-04-11 DIAGNOSIS — I4891 Unspecified atrial fibrillation: Secondary | ICD-10-CM

## 2022-04-11 DIAGNOSIS — I10 Essential (primary) hypertension: Secondary | ICD-10-CM | POA: Diagnosis not present

## 2022-04-11 MED ORDER — METOPROLOL TARTRATE 100 MG PO TABS
ORAL_TABLET | ORAL | 0 refills | Status: DC
  Filled 2022-04-11: qty 1, 1d supply, fill #0

## 2022-04-11 MED ORDER — BUPRENORPHINE HCL-NALOXONE HCL 8-2 MG SL FILM
1.0000 | ORAL_FILM | Freq: Three times a day (TID) | SUBLINGUAL | 0 refills | Status: DC
  Filled 2022-04-11: qty 42, 14d supply, fill #0

## 2022-04-11 NOTE — Patient Instructions (Signed)
Medication Instructions:  Your physician recommends that you continue on your current medications as directed. Please refer to the Current Medication list given to you today.  *If you need a refill on your cardiac medications before your next appointment, please call your pharmacy*   Lab Work: BMET today  If you have labs (blood work) drawn today and your tests are completely normal, you will receive your results only by: MyChart Message (if you have MyChart) OR A paper copy in the mail If you have any lab test that is abnormal or we need to change your treatment, we will call you to review the results.   Testing/Procedures: Coronary CTA-see instructions below  [employer] Cardiac Event Monitor Instructions Your physician has requested you wear your cardiac event monitor for __7__ days, (1-30). [employer] may call or text to confirm a shipping address. The monitor will be sent to a land address via UPS. [employer] will not ship a monitor to a PO BOX. It typically takes 3-5 days to receive your monitor after it has been enrolled. [employer] will assist with USPS tracking if your package is delayed. The telephone number for [employer] is [phone]. Once you have received your monitor, please review the enclosed instructions. Instruction tutorials can also be viewed under help and settings on the enclosed cell phone. Your monitor has already been registered assigning a specific monitor serial # to you.  Applying the monitor Remove cell phone from case and turn it on. The cell phone works as IT consultant and needs to be within [employer] of you at all times. The cell phone will need to be charged on a daily basis. We recommend you plug the cell phone into the enclosed charger at your bedside table every night.  Monitor batteries: You will receive two monitor batteries labelled #1 and #2. These are your recorders. Plug battery #2 onto the second connection on the enclosed charger.  Keep one battery on the charger at all times. This will keep the monitor battery deactivated. It will also keep it fully charged for when you need to switch your monitor batteries. A small light will be blinking on the battery emblem when it is charging. The light on the battery emblem will remain on when the battery is fully charged.  Open package of a Monitor strip. Insert battery #1 into black hood on strip and gently squeeze monitor battery onto connection as indicated in instruction booklet. Set aside while preparing skin.  Choose location for your strip, vertical or horizontal, as indicated in the instruction booklet. Shave to remove all hair from location. There cannot be any lotions, oils, powders, or colognes on skin where monitor is to be applied. Wipe skin clean with enclosed Saline wipe. Dry skin completely.  Peel paper labeled #1 off the back of the Monitor strip exposing the adhesive. Place the monitor on the chest in the vertical or horizontal position shown in the instruction booklet. One arrow on the monitor strip must be pointing upward. Carefully remove paper labeled #2, attaching remainder of strip to your skin. Try not to create any folds or wrinkles in the strip as you apply it.  Firmly press and release the circle in the center of the monitor battery. You will hear a small beep. This is turning the monitor battery on. The heart emblem on the monitor battery will light up every 5 seconds if the monitor battery in turned on and connected to the patient securely. Do not push and hold the circle  down as this turns the monitor battery off. The cell phone will locate the monitor battery. A screen will appear on the cell phone checking the connection of your monitor strip. This may read poor connection initially but change to good connection within the next minute. Once your monitor accepts the connection you will hear a series of 3 beeps followed by a climbing crescendo of  beeps. A screen will appear on the cell phone showing the two monitor strip placement options. Touch the picture that demonstrates where you applied the monitor strip.  Your monitor strip and battery are waterproof. You are able to shower, bathe, or swim with the monitor on. They just ask you do not submerge deeper than 3 feet underwater. We recommend removing the monitor if you are swimming in a lake, river, or ocean.  Your monitor battery will need to be switched to a fully charged monitor battery approximately once a week. The cell phone will alert you of an action which needs to be made.  On the cell phone, tap for details to reveal connection status, monitor battery status, and cell phone battery status. The green dots indicates your monitor is in good status. A red dot indicates there is something that needs your attention.  To record a symptom, click the circle on the monitor battery. In 30-60 seconds a list of symptoms will appear on the cell phone. Select your symptom and tap save. Your monitor will record a sustained or significant arrhythmia regardless of you clicking the button. Some patients do not feel the heart rhythm irregularities. [employer] will notify us of any serious or critical events.  Refer to instruction booklet for instructions on switching batteries, changing strips, the Do not disturb or Pause features, or any additional questions.  Call [employer] at [phone], to confirm your monitor is transmitting and record your baseline. They will answer any questions you may have regarding the monitor instructions at that time.  Returning the monitor to [place] all equipment back into blue box. Peel off strip of paper to expose adhesive and close box securely. There is a prepaid UPS shipping label on this box. Drop in a UPS drop box, or at a UPS facility like [employer]. You may also contact [employer] to arrange UPS to pick up monitor package at your  home.   Follow-Up: At [facility], you and your health needs are our priority.  As part of our continuing mission to provide you with exceptional heart care, we have created designated Provider Care Teams.  These Care Teams include your primary Cardiologist (physician) and Advanced Practice Providers (APPs -  Physician Assistants and Nurse Practitioners) who all work together to provide you with the care you need, when you need it.  We recommend signing up for the patient portal called "MyChart".  Sign up information is provided on this After Visit Summary.  MyChart is used to connect with patients for Virtual Visits (Telemedicine).  Patients are able to view lab/test results, encounter notes, upcoming appointments, etc.  Non-urgent messages can be sent to your provider as well.   To learn more about what you can do with MyChart, go to [email or web address].    Your next appointment:   3 month(s)  The format for your next appointment:   In Person  Provider:   Donato Heinz, MD     Other Instructions   Your cardiac CT will be scheduled at one of the below locations:   [facility] [address]  [place], [place] [address] [phone]  If scheduled at [facility], please arrive at the [facility] and Children's Entrance (Entrance C2) of [facility] 30 minutes prior to test start time. You can use the FREE valet parking offered at entrance C (encouraged to control the heart rate for the test)  Proceed to the [facility] Radiology Department (first floor) to check-in and test prep.  All radiology patients and guests should use entrance C2 at [facility], accessed from [facility], even though the hospital's physical address listed is [address]    Please follow these instructions carefully (unless otherwise directed):  Hold all erectile dysfunction medications at least 3 days (72 hrs) prior to test. (Ie  viagra, cialis, sildenafil, tadalafil, etc) We will administer nitroglycerin during this exam.   On the Night Before the Test: Be sure to Drink plenty of water. Do not consume any caffeinated/decaffeinated beverages or chocolate 12 hours prior to your test. Do not take any antihistamines 12 hours prior to your test.  On the Day of the Test: Drink plenty of water until 1 hour prior to the test. Do not eat any food 1 hour prior to test. You may take your regular medications prior to the test.  Take metoprolol (Lopressor) two hours prior to test. Hold chlorthalidone AM of CT      After the Test: Drink plenty of water. After receiving IV contrast, you may experience a mild flushed feeling. This is normal. On occasion, you may experience a mild rash up to 24 hours after the test. This is not dangerous. If this occurs, you can take Benadryl 25 mg and increase your fluid intake. If you experience trouble breathing, this can be serious. If it is severe call 911 IMMEDIATELY. If it is mild, please call our office. If you take any of these medications: Glipizide/Metformin, Avandament, Glucavance, please do not take 48 hours after completing test unless otherwise instructed.  We will call to schedule your test 2-4 weeks out understanding that some insurance companies will need an authorization prior to the service being performed.   For non-scheduling related questions, please contact the cardiac imaging nurse navigator should you have any questions/concerns: Marchia Bond, Cardiac Imaging Nurse Navigator Gordy Clement, Cardiac Imaging Nurse Navigator [facility] Direct Office Dial: [phone]   For scheduling needs, including cancellations and rescheduling, please call [place], [phone].

## 2022-04-12 ENCOUNTER — Encounter: Payer: Self-pay | Admitting: *Deleted

## 2022-04-12 NOTE — Progress Notes (Signed)
Patient ID: Joseph Ayala, male   DOB: 1965-09-17, 56 y.o.   MRN: [id number] Patient enrolled for [employer] to ship a cardiac event monitor to his address on file.

## 2022-04-16 ENCOUNTER — Encounter: Payer: Medicare HMO | Admitting: Student in an Organized Health Care Education/Training Program

## 2022-04-19 ENCOUNTER — Other Ambulatory Visit (HOSPITAL_COMMUNITY): Payer: Self-pay

## 2022-04-22 ENCOUNTER — Ambulatory Visit: Payer: Medicare HMO | Attending: Cardiology

## 2022-04-22 DIAGNOSIS — I4891 Unspecified atrial fibrillation: Secondary | ICD-10-CM

## 2022-04-23 ENCOUNTER — Encounter: Payer: Self-pay | Admitting: Student in an Organized Health Care Education/Training Program

## 2022-04-23 ENCOUNTER — Ambulatory Visit (INDEPENDENT_AMBULATORY_CARE_PROVIDER_SITE_OTHER): Payer: Medicare HMO | Admitting: Student in an Organized Health Care Education/Training Program

## 2022-04-23 ENCOUNTER — Other Ambulatory Visit (HOSPITAL_COMMUNITY): Payer: Self-pay

## 2022-04-23 VITALS — BP 117/79 | HR 82 | Temp 97.5°F | Ht 71.0 in | Wt 200.6 lb

## 2022-04-23 DIAGNOSIS — I10 Essential (primary) hypertension: Secondary | ICD-10-CM | POA: Diagnosis not present

## 2022-04-23 DIAGNOSIS — F1994 Other psychoactive substance use, unspecified with psychoactive substance-induced mood disorder: Secondary | ICD-10-CM

## 2022-04-23 DIAGNOSIS — Z23 Encounter for immunization: Secondary | ICD-10-CM | POA: Diagnosis not present

## 2022-04-23 DIAGNOSIS — F1190 Opioid use, unspecified, uncomplicated: Secondary | ICD-10-CM

## 2022-04-23 MED ORDER — BUPRENORPHINE HCL-NALOXONE HCL 8-2 MG SL FILM
1.0000 | ORAL_FILM | Freq: Three times a day (TID) | SUBLINGUAL | 0 refills | Status: DC
  Filled 2022-04-23 (×3): qty 90, 30d supply, fill #0

## 2022-04-23 NOTE — Assessment & Plan Note (Signed)
Seems to be well controlled currently.  Taking Suboxone 8 mg film 3 times daily.  I reviewed the database which was appropriate.  Reports total abstinence from other substances since he finished rehab a few months ago.  Going to [facility].  We will check a urine tox assure today.  Will refill 4-week supply of Suboxone and see him back in clinic around that same time.

## 2022-04-23 NOTE — Progress Notes (Signed)
Established Patient Office Visit  Subjective   Patient ID: Joseph Ayala, male    DOB: 22-Jun-1966  Age: 56 y.o. MRN: [id number]  Chief Complaint  Patient presents with   Back Pain   feet pain   Medication Refill    HPI  56 year old person here for follow-up of opioid use disorder.  Reports doing well over the last 4 weeks.  Denies any substance use besides prescribed medications.  His wife had a significant car accident about 2 weeks ago leading to a missed appointments, which she is recovering well at home.  His mood has been good, energy levels have also been good.  He reports going to [facility] and has followed up with a mental health provider in [place].  He did stop taking psych medications because of a feeling of persistent drowsiness.  He reports taking all his other medications including the Suboxone.  He reports having 1 film of Suboxone left.  His breathing issue has resolved, denies any dyspnea with exertion now.  Also had some sensation of chest and arm discomfort, evaluated in the emergency department for this.  He saw cardiology, currently wearing a Zio patch monitor without issue.  He also has a appointment later in the month to do a CT coronary angiogram.    Objective:     BP 117/79 (BP Location: Right Arm, Patient Position: Sitting, Cuff Size: Small)   Pulse 82   Temp (!) 97.5 F (36.4 C) (Oral)   Ht 5\' 11"  (1.803 m)   Wt 200 lb 9.6 oz (91 kg)   SpO2 97%   BMI 27.98 kg/m    Physical Exam  General: Well-appearing man, no distress CV: Regular rate and rhythm with no murmurs Lungs: Clear throughout, no wheezing or crackles Extremities: Warm, well-perfused, no lower extremity edema Psych: Appropriate affect, not depressed or anxious appearing Neuro: Alert, conversational, full strength and normal gait    Assessment & Plan:   Problem List Items Addressed This Visit       High   Opioid use disorder (Chronic)    Seems to be well controlled currently.   Taking Suboxone 8 mg film 3 times daily.  I reviewed the database which was appropriate.  Reports total abstinence from other substances since he finished rehab a few months ago.  Going to [facility].  We will check a urine tox assure today.  Will refill 4-week supply of Suboxone and see him back in clinic around that same time.      Relevant Medications   Buprenorphine HCl-Naloxone HCl 8-2 MG FILM   Other Relevant Orders   ToxAssure Select,+Antidepr,UR   Substance induced mood disorder ([name]) - Primary (Chronic)    Being managed by Karie Georges NP on [facility].  Was taking Lamictal and trazodone, but discontinued those last week because he was feeling unwell.  Having too much sedation.  He has follow-up with his provider later today, I encouraged him to keep working through this process, do not give up, I really think he would benefit from a long-term mood stabilizer.  Unclear to me if he has underlying bipolar disease, or some other mood disorder that was worsened by substance use.        Medium    Hypertension (Chronic)    Blood pressure is well controlled today, plan to continue with chlorthalidone 25 mg daily.  Also on low-dose metoprolol for his history of heart failure with previously reduced ejection fraction.  We will check BMP  today.      Relevant Orders   BMP8+Anion Gap   Other Visit Diagnoses     Need for immunization against influenza       Relevant Orders   Flu Vaccine QUAD 71mo+IM (Fluarix, Fluzone & Alfiuria Quad PF) (Completed)       Return in about 4 weeks (around 05/21/2022).    [name], MD

## 2022-04-23 NOTE — Patient Instructions (Signed)
Seen today in clinic.  Your blood pressure is under great control.  Please continue taking chlorthalidone. Follow-up with Karie Georges NP about your mental health and mood disorder medication. Complete the CT scan ordered by Dr. Nechama Guard.  We drew the blood work today for that scan. We gave you a flu shot today. I refilled 1 month of Suboxone, please come back and see me in clinic in about 4 weeks before that runs out.

## 2022-04-23 NOTE — Assessment & Plan Note (Signed)
Blood pressure is well controlled today, plan to continue with chlorthalidone 25 mg daily.  Also on low-dose metoprolol for his history of heart failure with previously reduced ejection fraction.  We will check BMP today.

## 2022-04-23 NOTE — Assessment & Plan Note (Signed)
Being managed by Karie Georges NP on [facility].  Was taking Lamictal and trazodone, but discontinued those last week because he was feeling unwell.  Having too much sedation.  He has follow-up with his provider later today, I encouraged him to keep working through this process, do not give up, I really think he would benefit from a long-term mood stabilizer.  Unclear to me if he has underlying bipolar disease, or some other mood disorder that was worsened by substance use.

## 2022-04-24 LAB — BMP8+ANION GAP
Anion Gap: 16 mmol/L (ref 10.0–18.0)
BUN/Creatinine Ratio: 33 — ABNORMAL HIGH (ref 9–20)
BUN: 22 mg/dL (ref 6–24)
CO2: 25 mmol/L (ref 20–29)
Calcium: 10.1 mg/dL (ref 8.7–10.2)
Chloride: 98 mmol/L (ref 96–106)
Creatinine, Ser: 0.66 mg/dL — ABNORMAL LOW (ref 0.76–1.27)
Glucose: 90 mg/dL (ref 70–99)
Potassium: 4.3 mmol/L (ref 3.5–5.2)
Sodium: 139 mmol/L (ref 134–144)
eGFR: 111 mL/min/{1.73_m2} (ref >59)

## 2022-04-25 ENCOUNTER — Other Ambulatory Visit (HOSPITAL_COMMUNITY): Payer: Self-pay

## 2022-04-25 ENCOUNTER — Telehealth (HOSPITAL_COMMUNITY): Payer: Self-pay | Admitting: Emergency Medicine

## 2022-04-25 NOTE — Telephone Encounter (Signed)
Reaching out to patient to offer assistance regarding upcoming cardiac imaging study; pt verbalizes understanding of appt date/time, parking situation and where to check in, pre-test NPO status and medications ordered, and verified current allergies; name and call back number provided for further questions should they arise Samika Vetsch RN Navigator Cardiac Imaging [facility] [phone] office [phone] cell 

## 2022-04-26 ENCOUNTER — Ambulatory Visit (HOSPITAL_COMMUNITY)
Admission: RE | Admit: 2022-04-26 | Discharge: 2022-04-26 | Disposition: A | Discharge status: Home / Self Care | Payer: Medicare HMO | Source: Ambulatory Visit | Attending: Cardiology | Admitting: Cardiology

## 2022-04-26 DIAGNOSIS — M791 Myalgia, unspecified site: Secondary | ICD-10-CM | POA: Diagnosis not present

## 2022-04-26 DIAGNOSIS — R079 Chest pain, unspecified: Secondary | ICD-10-CM | POA: Diagnosis not present

## 2022-04-26 DIAGNOSIS — R911 Solitary pulmonary nodule: Secondary | ICD-10-CM | POA: Diagnosis not present

## 2022-04-26 DIAGNOSIS — I712 Thoracic aortic aneurysm, without rupture, unspecified: Secondary | ICD-10-CM | POA: Diagnosis not present

## 2022-04-26 DIAGNOSIS — I7121 Aneurysm of the ascending aorta, without rupture: Secondary | ICD-10-CM | POA: Insufficient documentation

## 2022-04-26 DIAGNOSIS — E7404 McArdle disease: Secondary | ICD-10-CM | POA: Diagnosis not present

## 2022-04-26 DIAGNOSIS — Z886 Allergy status to analgesic agent status: Secondary | ICD-10-CM | POA: Diagnosis not present

## 2022-04-26 DIAGNOSIS — G729 Myopathy, unspecified: Secondary | ICD-10-CM | POA: Diagnosis not present

## 2022-04-26 DIAGNOSIS — I2510 Atherosclerotic heart disease of native coronary artery without angina pectoris: Secondary | ICD-10-CM | POA: Insufficient documentation

## 2022-04-26 DIAGNOSIS — I700 Atherosclerosis of aorta: Secondary | ICD-10-CM | POA: Diagnosis not present

## 2022-04-26 DIAGNOSIS — Z888 Allergy status to other drugs, medicaments and biological substances status: Secondary | ICD-10-CM | POA: Diagnosis not present

## 2022-04-26 DIAGNOSIS — Z885 Allergy status to narcotic agent status: Secondary | ICD-10-CM | POA: Diagnosis not present

## 2022-04-26 LAB — TOXASSURE SELECT,+ANTIDEPR,UR

## 2022-04-26 MED ORDER — NITROGLYCERIN 0.4 MG SL SUBL
SUBLINGUAL_TABLET | SUBLINGUAL | Status: AC
  Filled 2022-04-26: qty 2

## 2022-04-26 MED ORDER — IOHEXOL 350 MG/ML SOLN
100.0000 mL | Freq: Once | INTRAVENOUS | Status: AC | PRN
  Administered 2022-04-26: 100 mL via INTRAVENOUS

## 2022-04-26 MED ORDER — NITROGLYCERIN 0.4 MG SL SUBL
0.8000 mg | SUBLINGUAL_TABLET | Freq: Once | SUBLINGUAL | Status: AC
  Administered 2022-04-26: 0.8 mg via SUBLINGUAL

## 2022-04-27 ENCOUNTER — Other Ambulatory Visit (HOSPITAL_COMMUNITY): Payer: Self-pay

## 2022-04-30 ENCOUNTER — Encounter: Payer: Self-pay | Admitting: Student in an Organized Health Care Education/Training Program

## 2022-04-30 DIAGNOSIS — F1190 Opioid use, unspecified, uncomplicated: Secondary | ICD-10-CM

## 2022-05-01 ENCOUNTER — Encounter: Payer: Self-pay | Admitting: Student in an Organized Health Care Education/Training Program

## 2022-05-02 ENCOUNTER — Encounter: Payer: Self-pay | Admitting: Cardiology

## 2022-05-02 ENCOUNTER — Encounter: Payer: Self-pay | Admitting: Physician Assistant

## 2022-05-02 ENCOUNTER — Ambulatory Visit
Admission: EM | Admit: 2022-05-02 | Discharge: 2022-05-02 | Disposition: A | Discharge status: Home / Self Care | Payer: Medicare HMO | Attending: Physician Assistant | Admitting: Physician Assistant

## 2022-05-02 ENCOUNTER — Other Ambulatory Visit (HOSPITAL_COMMUNITY): Payer: Self-pay

## 2022-05-02 ENCOUNTER — Other Ambulatory Visit: Payer: Medicare HMO

## 2022-05-02 ENCOUNTER — Ambulatory Visit: Payer: Medicare HMO | Admitting: Physician Assistant

## 2022-05-02 VITALS — BP 134/87 | HR 82 | Resp 20 | Ht 71.0 in

## 2022-05-02 DIAGNOSIS — L509 Urticaria, unspecified: Secondary | ICD-10-CM

## 2022-05-02 DIAGNOSIS — T7840XA Allergy, unspecified, initial encounter: Secondary | ICD-10-CM

## 2022-05-02 DIAGNOSIS — I712 Thoracic aortic aneurysm, without rupture, unspecified: Secondary | ICD-10-CM

## 2022-05-02 DIAGNOSIS — I2510 Atherosclerotic heart disease of native coronary artery without angina pectoris: Secondary | ICD-10-CM

## 2022-05-02 DIAGNOSIS — E785 Hyperlipidemia, unspecified: Secondary | ICD-10-CM

## 2022-05-02 MED ORDER — METHYLPREDNISOLONE SODIUM SUCC 125 MG IJ SOLR
60.0000 mg | Freq: Once | INTRAMUSCULAR | Status: AC
  Administered 2022-05-02: 60 mg via INTRAMUSCULAR

## 2022-05-02 MED ORDER — LORATADINE 10 MG PO TABS
10.0000 mg | ORAL_TABLET | Freq: Every day | ORAL | 0 refills | Status: DC
  Filled 2022-05-02 – 2022-10-29 (×2): qty 15, 15d supply, fill #0

## 2022-05-02 MED ORDER — PREDNISONE 10 MG PO TABS
ORAL_TABLET | ORAL | 0 refills | Status: AC
  Filled 2022-05-02: qty 42, 12d supply, fill #0

## 2022-05-02 NOTE — ED Provider Notes (Signed)
Joseph Ayala    CSN: [id number] Arrival date & time: 05/02/22  4098      History   Chief Complaint Chief Complaint  Patient presents with   Allergic Reaction    HPI Tylan Joseph Ayala is a 56 y.o. male.   56 year old male presents with rash and itching.  Patient indicates that 5 days ago he had a CT scan performed and was injected with dye to in order to perform the exam.  Patient indicates that the next day he started having rash, hives, itching which has continued over the past several days and is not improved or relieved with Benadryl.  Patient indicates that he is taking Benadryl 50 mg twice daily and it is only decreasing his symptoms slightly.  Patient indicates that he is not having any fever, chills, shortness of breath, wheezing, swelling of the lips or the tongue.  Patient denies any difficulty swallowing.  Patient indicates that he is here to get additional medication to help decrease his hives and itching.  Patient indicates that he is not allergic to shellfish.  Currently tolerating fluids well and has normal appetite.   Allergic Reaction Presenting symptoms: rash (rash and hives over areas of body)     Past Medical History:  Diagnosis Date   Acute HFmrEF (heart failure with mildly reduced ejection fraction) (HCC) 10/31/2020   Acute hypoxemic respiratory failure (HCC)    Acute respiratory failure with hypoxia (HCC) 08/28/2020   CAP (community acquired pneumonia) 08/28/2020   Degenerative disc disease, lumbar    Dental crown present    ETOH abuse    History of kidney stones    Hypertension    states under control with meds., has been on med. x 1 yr. - is currently out of med., to see PCP 02/06/2018   Marijuana use, continuous    MRSA pneumonia (HCC) 09/09/2020   Muscle atrophy 01/2018   Muscle weakness 01/2018   Necrotizing myopathy    Necrotizing pneumonia (HCC) 09/09/2020   Neuropathic pain    Opioid use disorder    PAF (paroxysmal atrial fibrillation)  (HCC)    Pleural effusion    Sepsis (HCC) 08/28/2020   Thoracic aortic aneurysm (TAA) (HCC)    a. seen on CT 08/2020.    Patient Active Problem List   Diagnosis Date Noted   McArdle's syndrome (glycogen storage disease type V) (HCC) 04/26/2022   Substance induced mood disorder (HCC) 01/01/2022   Hyperlipidemia 08/21/2021   Healthcare maintenance 05/15/2021   Erectile dysfunction 10/31/2020   Thoracic aortic aneurysm without rupture (HCC)    Hypertension 07/13/2019   ADHD 06/29/2019   Low testosterone 06/01/2019   Opioid use disorder 09/11/2018   Necrotizing myopathy 12/27/2017   Chronic pain syndrome 10/22/2017   Degeneration of lumbar intervertebral disc 09/25/2017   Lumbar spondylosis 09/25/2017    Past Surgical History:  Procedure Laterality Date   ENDOVENOUS ABLATION SAPHENOUS VEIN W/ LASER Right 12-29-2015   endovenous laser ablation right greater saphenous vein, stab phlebectomy > 20 incisions right leg, sclerotherapy right leg by Gretta Began MD     MINOR MUSCLE BIOPSY Right 02/11/2018   Procedure: RECTUS FEMORIS MUSCLE BIOPSY;  Surgeon: Darnell Level, MD;  Location: [facility];  Service: General;  Laterality: Right;   MUSCLE BIOPSY Right 02/11/2018   Procedure: DELTOID MUSCLE BIOPSY;  Surgeon: Darnell Level, MD;  Location: [facility];  Service: General;  Laterality: Right;   NASAL FRACTURE SURGERY     TRACHEOSTOMY TUBE  PLACEMENT N/A 09/14/2020   Procedure: TRACHEOSTOMY;  Surgeon: Suzanna Obey, MD;  Location: [facility];  Service: ENT;  Laterality: N/A;       Home Medications    Prior to Admission medications   Medication Sig Start Date End Date Taking? Authorizing Provider  loratadine (CLARITIN) 10 MG tablet Take 1 tablet (10 mg total) by mouth daily. 05/02/22  Yes Ellsworth Lennox, PA-C  predniSONE (STERAPRED UNI-PAK 21 TAB) 10 MG (21) TBPK tablet Take by mouth daily. Take 6 tabs by mouth daily  for 2 days, then 5 tabs for 2 days, then 4 tabs for 2  days, then 3 tabs for 2 days, 2 tabs for 2 days, then 1 tab by mouth daily for 2 days 05/02/22  Yes Ellsworth Lennox, PA-C  acetaminophen (TYLENOL) 500 MG tablet Take 1,500 mg by mouth every morning.    [provider]  Ascorbic Acid (VITAMIN C) 100 MG tablet Take 100 mg by mouth daily.    [provider]  Buprenorphine HCl-Naloxone HCl 8-2 MG FILM Place 1 Film under the tongue in the morning, at noon, and at bedtime. 04/23/22   [name], MD  chlorthalidone (HYGROTON) 25 MG tablet Take 1 tablet (25 mg total) by mouth daily. 03/14/22   Masters, Katie, DO  ciclopirox (PENLAC) 8 % solution Apply topically at bedtime. Apply over nail and surrounding skin. Apply daily over previous coat. After seven (7) days, may remove with alcohol and continue cycle. 03/16/22   [name], MD  ezetimibe (ZETIA) 10 MG tablet Take 1 tablet (10 mg total) by mouth daily. 08/24/21   [name], MD  ibuprofen (ADVIL) 200 MG tablet Take 600 mg by mouth every morning.    [provider]  lamoTRIgine (LAMICTAL) 100 MG tablet Take 1 tablet by mouth everyday 03/23/22     metoprolol succinate (TOPROL-XL) 50 MG 24 hr tablet Take 1 tablet (50 mg total) by mouth daily. Take with or immediately following a meal. 08/21/21   [name], MD  metoprolol tartrate (LOPRESSOR) 100 MG tablet Take 1 tablet (100mg ) TWO hours prior to CT scan 04/11/22   06/11/22, MD  Multiple Vitamin (MULTIVITAMIN WITH MINERALS) TABS tablet Take 1 tablet by mouth daily.    [provider]  sildenafil (VIAGRA) 25 MG tablet Take 1 tablet (25 mg total) by mouth as needed for erectile dysfunction. 06/19/21 06/19/22  14/12/23, MD  testosterone cypionate (DEPOTESTOSTERONE CYPIONATE) 200 MG/ML injection Inject 2 mLs (400 mg total) into the muscle every 14 (fourteen) days. 03/23/22   03/25/22, MD  traZODone (DESYREL) 50 MG tablet Take 1-2 tablets (50-100 mg  total) by mouth at bedtime as needed for sleep 02/02/22     zinc gluconate 50 MG tablet Take 50 mg by mouth daily.    [provider]    Family History Family History  Problem Relation Age of Onset   Varicose Veins Brother    Drug abuse Brother     Social History Social History   Tobacco Use   Smoking status: Never   Smokeless tobacco: Former  02/04/22 Use: Never used  Substance Use Topics   Alcohol use: Not Currently    Comment: occasionally   Drug use: No     Allergies   Lyrica cr [pregabalin er], Vicodin [hydrocodone-acetaminophen], and Quinolones   Review of Systems Review of Systems  Skin:  Positive for rash (rash and hives over areas of  body).     Physical Exam Triage Vital Signs ED Triage Vitals [05/02/22 0945]  Enc Vitals Group     BP 106/74     Pulse Rate 71     Resp 16     Temp (!) 97.4 F (36.3 C)     Temp Source Oral     SpO2 97 %     Weight      Height      Head Circumference      Peak Flow      Pain Score 0     Pain Loc      Pain Edu?      Excl. in [place]?    No data found.  Updated Vital Signs BP 106/74 (BP Location: Right Arm)   Pulse 71   Temp (!) 97.4 F (36.3 C) (Oral)   Resp 16   SpO2 97%   Visual Acuity Right Eye Distance:   Left Eye Distance:   Bilateral Distance:    Right Eye Near:   Left Eye Near:    Bilateral Near:     Physical Exam Constitutional:      Appearance: Normal appearance.  HENT:     Mouth/Throat:     Mouth: Mucous membranes are moist.     Pharynx: Oropharynx is clear. Uvula midline. No pharyngeal swelling or posterior oropharyngeal erythema.  Cardiovascular:     Rate and Rhythm: Normal rate and regular rhythm.     Heart sounds: Normal heart sounds.  Pulmonary:     Effort: Pulmonary effort is normal.     Breath sounds: Normal breath sounds and air entry. No wheezing, rhonchi or rales.  Lymphadenopathy:     Cervical: No cervical adenopathy.  Skin:    Comments: Skin: Patient has  diffuse redness and areas of hive eruption that are scattered on the anterior posterior trunk and the extremities.  Neurological:     Mental Status: He is alert.      UC Treatments / Results  Labs (all labs ordered are listed, but only abnormal results are displayed) Labs Reviewed - No data to display  EKG   Radiology No results found.  Procedures Procedures (including critical Ayala time)  Medications Ordered in UC Medications  methylPREDNISolone sodium succinate (SOLU-MEDROL) 125 mg/2 mL injection 60 mg (has no administration in time range)    Initial Impression / Assessment and Plan / UC Course  I have reviewed the triage vital signs and the nursing notes.  Pertinent labs & imaging results that were available during my Ayala of the patient were reviewed by me and considered in my medical decision making (see chart for details).    Plan: 1.  The allergic reaction be treated with the following: A.  Claritin 10 mg daily to help decrease itching and reaction. B.  Sterapred Dosepak 21 tab, 10 mg tapering dose. 2.  The hives will be treated with the following: A.  Solu-Medrol 60 mg IM given today. B.  Patient advised to continue Benadryl 25 mg 3 times a day to help decrease itching, hives and rash. .  Patient advised follow-up PCP return to urgent Ayala if symptoms fail to improve. Final Clinical Impressions(s) / UC Diagnoses   Final diagnoses:  Hives  Allergic reaction, initial encounter     Discharge Instructions      Advised to continue taking Benadryl 25 mg every 8 hours on a regular basis to help decrease itching and rash. Advised take Claritin 10 mg daily to help reduce  the itching and rash. Advised take prednisone Sterapred Dosepak taper to help reduce the allergic reaction, hives and itching. To increase fluid intake as the medications will tend to dehydrate. Is to follow-up with PCP or return to urgent Ayala if symptoms fail to improve.    ED Prescriptions      Medication Sig Dispense Auth. Provider   loratadine (CLARITIN) 10 MG tablet Take 1 tablet (10 mg total) by mouth daily. 15 tablet Ellsworth Lennox, PA-C   predniSONE (STERAPRED UNI-PAK 21 TAB) 10 MG (21) TBPK tablet Take by mouth daily. Take 6 tabs by mouth daily  for 2 days, then 5 tabs for 2 days, then 4 tabs for 2 days, then 3 tabs for 2 days, 2 tabs for 2 days, then 1 tab by mouth daily for 2 days 42 tablet Ellsworth Lennox, PA-C      PDMP not reviewed this encounter.   Ellsworth Lennox, PA-C 05/02/22 1006

## 2022-05-02 NOTE — ED Triage Notes (Signed)
Pt c/o generalized rash s/p ct with contrast ~ last Thursday. States has been taking benadryl at home without relief. Questioning if he needs steroid tx.

## 2022-05-02 NOTE — Patient Instructions (Signed)
Risk Modification in those with ascending thoracic aortic aneurysm:  Continue good control of blood pressure (prefer SBP 130/80 or less)  2. Avoid fluoroquinolone antibiotics (I.e Ciprofloxacin, Avelox, Levofloxacin, Ofloxacin)  3.  Use of statin (to decrease cardiovascular risk)  4.  Exercise and activity limitations is individualized, but in general, contact sports are to be  avoided and one should avoid heavy lifting (defined as half of ideal body weight) and exercises involving sustained Valsalva maneuver.  5. Counseling for those suspected of having genetically mediated disease. First-degree relatives of those with TAA disease should be screened as well as those who have a connective tissue disease (I.e with Marfan syndrome, Ehlers-Danlos syndrome,  and Loeys-Dietz syndrome) or a  bicuspid aortic valve,have an increased risk for  complications related to TAA  6. If one has tobacco abuse, smoking cessation is highly encouraged.  

## 2022-05-02 NOTE — Progress Notes (Signed)
Joseph Ayala       ,[place] [id number]             [phone]        Joseph Ayala [id number] [birth year]   History of Present Illness: Joseph Ayala is a 56 year old male with a past medical history of necrotizing myopathy, necrotizing pneumonia, and HTN who presents to the clinic for annual aortic aneurysm surveillance. Today he admits to chest pain and tightness that brought him to the ED on 09/18. There were no signs of acute ischemia on EKG and troponin levels were normal. He also admits to shortness of breath but states this is unchanged since his hospitalization for pneumonia last year. He denies dizziness and LOC. He is closely followed by cardiology who performed a CT coronary calcium score and CTA on 04/26/22. CT calcium score was 271 placing him in the 92nd percentile for his age and sex. CTA showed RUL opacities that are nodular like measuring up to 87mm as well as an ascending thoracic aortic aneurysm measuring up to 4.7cm. Echocardiogram on 12/25/21 shows a tricuspid aortic valve.  Past Medical History:  Diagnosis Date   Acute HFmrEF (heart failure with mildly reduced ejection fraction) ([place]) 10/31/2020   Acute hypoxemic respiratory failure (HCC)    Acute respiratory failure with hypoxia ([place]) 08/28/2020   CAP (community acquired pneumonia) 08/28/2020   Degenerative disc disease, lumbar    Dental crown present    ETOH abuse    History of kidney stones    Hypertension    states under control with meds., has been on med. x 1 yr. - is currently out of med., to see PCP 02/06/2018   Marijuana use, continuous    MRSA pneumonia ([place]) 09/09/2020   Muscle atrophy 01/2018   Muscle weakness 01/2018   Necrotizing myopathy    Necrotizing pneumonia ([place]) 09/09/2020   Neuropathic pain    Opioid use disorder    PAF (paroxysmal atrial fibrillation) (HCC)    Pleural effusion    Sepsis ([place]) 08/28/2020   Thoracic aortic aneurysm (TAA) ([place])    a. seen on CT 08/2020.     Current Outpatient Medications on File Prior to Visit  Medication Sig Dispense Refill   acetaminophen (TYLENOL) 500 MG tablet Take 1,500 mg by mouth every morning.     Ascorbic Acid (VITAMIN C) 100 MG tablet Take 100 mg by mouth daily.     Buprenorphine HCl-Naloxone HCl 8-2 MG FILM Place 1 Film under the tongue in the morning, at noon, and at bedtime. 90 each 0   chlorthalidone (HYGROTON) 25 MG tablet Take 1 tablet (25 mg total) by mouth daily. 90 tablet 3   ciclopirox (PENLAC) 8 % solution Apply topically at bedtime. Apply over nail and surrounding skin. Apply daily over previous coat. After seven (7) days, may remove with alcohol and continue cycle. 6.6 mL 0   ezetimibe (ZETIA) 10 MG tablet Take 1 tablet (10 mg total) by mouth daily. 90 tablet 3   ibuprofen (ADVIL) 200 MG tablet Take 600 mg by mouth every morning.     metoprolol succinate (TOPROL-XL) 50 MG 24 hr tablet Take 1 tablet (50 mg total) by mouth daily. Take with or immediately following a meal. 90 tablet 3   metoprolol tartrate (LOPRESSOR) 100 MG tablet Take 1 tablet (100mg ) TWO hours prior to CT scan 1 tablet 0   Multiple Vitamin (MULTIVITAMIN WITH MINERALS) TABS tablet Take 1 tablet by mouth  daily.     sildenafil (VIAGRA) 25 MG tablet Take 1 tablet (25 mg total) by mouth as needed for erectile dysfunction. 20 tablet 1   testosterone cypionate (DEPOTESTOSTERONE CYPIONATE) 200 MG/ML injection Inject 2 mLs (400 mg total) into the muscle every 14 (fourteen) days. 4 mL 1   zinc gluconate 50 MG tablet Take 50 mg by mouth daily.     lamoTRIgine (LAMICTAL) 100 MG tablet Take 1 tablet by mouth everyday (Patient not taking: Reported on 05/02/2022) 30 tablet 1   traZODone (DESYREL) 50 MG tablet Take 1-2 tablets (50-100 mg total) by mouth at bedtime as needed for sleep (Patient not taking: Reported on 05/02/2022) 60 tablet 1   No current facility-administered medications on file prior to visit.   Today's Vitals   05/02/22 1327  BP:  134/87  Pulse: 82  Resp: 20  SpO2: 95%  Height: 5\' 11"  (1.803 m)   Body mass index is 27.98 kg/m.   Physical Exam Gen: Alert, no distress Neuro: Grossly intact CV: Regular rate and rhythm, no murmur, rub or gallop Pulm: Clear to auscultation bilaterally GI: +BS, no tenderness, no distension Extremities: No edema, 2+ radial pulses bilaterally  CTA Results:  CLINICAL DATA:  Aortic aneurysm, known or suspected   EXAM: CT ANGIOGRAPHY CHEST WITH CONTRAST   TECHNIQUE: Multidetector CT imaging of the chest was performed using the standard protocol during bolus administration of intravenous contrast. Multiplanar CT image reconstructions and MIPs were obtained to evaluate the vascular anatomy.   RADIATION DOSE REDUCTION: This exam was performed according to the departmental dose-optimization program which includes automated exposure control, adjustment of the mA and/or kV according to patient size and/or use of iterative reconstruction technique.   CONTRAST:  171mL OMNIPAQUE IOHEXOL 350 MG/ML SOLN   COMPARISON:  None Available.   FINDINGS: Cardiovascular: Preferential opacification of the thoracic aorta. The ascending thoracic aorta is enlarged in caliber measuring up to 4.7 cm. The descending thoracic aorta is normal in caliber. No evidence of thoracic aortic dissection. Normal heart size. No significant pericardial effusion. Mild atherosclerotic plaque of the thoracic aorta. At least 2 vessel coronary artery calcifications. The main pulmonary artery measures at the upper limits of normal. No central pulmonary embolus.   Mediastinum/Nodes: No enlarged mediastinal, hilar, or axillary lymph nodes. Thyroid gland, trachea, and esophagus demonstrate no significant findings.   Lungs/Pleura: Right middle lobe architectural distortion. Bilateral lower lobe subsegmental atelectasis. Mild scattered vague right upper lobe peribronchovascular ground-glass airspace opacity  in cystic changes (4:63, 7:62). Scattered right upper lobe ground-glass airspace opacities that are nodular-like measuring up to 9 mm (4:52). No pulmonary nodule. No pulmonary mass. No pleural effusion. No pneumothorax.   Upper Abdomen: No acute abnormality.   Musculoskeletal:   No chest wall abnormality.   No suspicious lytic or blastic osseous lesions. No acute displaced fracture.   Review of the MIP images confirms the above findings.   IMPRESSION: 1. Vague right upper lobe peribronchovascular ground-glass airspace opacity in cystic changes. Associated scattered right upper lobe ground-glass airspace opacities that are nodular-like measuring up to 9 mFinding may represent infection/inflammation. Non-contrast chest CT at 3-6 months is recommended. If nodules persist, subsequent management will be based upon the most suspicious nodule(s). This recommendation follows the consensus statement: Guidelines for Management of Incidental Pulmonary Nodules Detected on CT Images: From the Fleischner Society 2017; Radiology 2017; 284:228-243. 2. Aneurysmal ascending thoracic aorta (4.7 cm). Ascending thoracic aortic aneurysm. Recommend semi-annual imaging followup by CTA or MRA and referral to  cardiothoracic surgery if not already obtained. This recommendation follows 2010 ACCF/AHA/AATS/ACR/ASA/SCA/SCAI/SIR/STS/SVM Guidelines for the Diagnosis and Management of Patients With Thoracic Aortic Disease. Circulation. 2010; [id number]. Aortic aneurysm NOS (ICD10-I71.9). 3. Aortic Atherosclerosis (ICD10-I70.0) including at least 2 vessel coronary calcifications.     Electronically Signed   By: Iven Finn M.D.   On: 04/26/2022 16:54    A/P: Ascending thoracic aortic aneurysm: Joseph Ayala presents with an ascending aortic aneurysm that has increased in size from 4.3 to 4.7 cm in the past year. He admits to chest pain and tightness, for which he was seen in the ED and ruled out MI.  He is being followed closely by cardiology. His blood pressure is OK in the clinic today at 134/87. He does not check it often at home but has a blood pressure cuff so we discussed checking it more often at home and notifying his PCP if it remains elevated. We discussed lifting restrictions and no extended valsalva maneuvers. Continue close follow up with cardiology. Will plan to follow up in 6 months with CTA with surgeon.   RUL ground glass opacities: Joseph Ayala has new RUL ground glass opacities measuring up to 72mm. Will monitor on 6 month CTA.   CAD: Joseph Ayala is in the 92nd percentile on his coronary calcium scoring, as well as experiencing chest pain and tightness. Recommended close follow up with his cardiologist as well as diet and exercise.   Risk Modification:  Statin:  does not tolerate statin, on zetia  Smoking cessation instruction/counseling given:  Nonsmoker  Patient was counseled on importance of Blood Pressure Control.  Despite Medical intervention if the patient notices persistently elevated blood pressure readings.  They are instructed to contact their Primary Care Physician  Please avoid use of Fluoroquinolones as this can potentially increase your risk of Aortic Rupture and/or Dissection  Patient educated on signs and symptoms of Aortic Dissection, handout also provided in AVS  Exercise and activity limitations is individualized, but in general, contact sports are to be avoided and one should avoid heavy lifting (defined as half of ideal body weight) and exercises involving sustained Valsalva maneuver.   Magdalene River, PA-C 05/02/22

## 2022-05-02 NOTE — Telephone Encounter (Signed)
Attempt to call patient at # listed-unable to leave VM (VM not set up)

## 2022-05-02 NOTE — Discharge Instructions (Signed)
Advised to continue taking Benadryl 25 mg every 8 hours on a regular basis to help decrease itching and rash. Advised take Claritin 10 mg daily to help reduce the itching and rash. Advised take prednisone Sterapred Dosepak taper to help reduce the allergic reaction, hives and itching. To increase fluid intake as the medications will tend to dehydrate. Is to follow-up with PCP or return to urgent care if symptoms fail to improve.

## 2022-05-03 ENCOUNTER — Other Ambulatory Visit (HOSPITAL_COMMUNITY): Payer: Self-pay

## 2022-05-03 MED ORDER — ROSUVASTATIN CALCIUM 10 MG PO TABS
10.0000 mg | ORAL_TABLET | Freq: Every day | ORAL | 3 refills | Status: DC
  Filled 2022-05-03: qty 90, 90d supply, fill #0

## 2022-05-09 DIAGNOSIS — I2510 Atherosclerotic heart disease of native coronary artery without angina pectoris: Secondary | ICD-10-CM | POA: Diagnosis not present

## 2022-05-09 DIAGNOSIS — E785 Hyperlipidemia, unspecified: Secondary | ICD-10-CM | POA: Diagnosis not present

## 2022-05-10 LAB — LIPID PANEL
Chol/HDL Ratio: 2.8 ratio (ref 0.0–5.0)
Cholesterol, Total: 168 mg/dL (ref 100–199)
HDL: 61 mg/dL (ref >39)
LDL Chol Calc (NIH): 62 mg/dL (ref 0–99)
Triglycerides: 292 mg/dL — ABNORMAL HIGH (ref 0–149)
VLDL Cholesterol Cal: 45 mg/dL — ABNORMAL HIGH (ref 5–40)

## 2022-05-10 NOTE — Addendum Note (Signed)
Addended by: Patria Mane A on: 05/10/2022 12:32 PM   Modules accepted: Orders

## 2022-05-17 ENCOUNTER — Other Ambulatory Visit (HOSPITAL_COMMUNITY): Payer: Self-pay

## 2022-05-17 ENCOUNTER — Other Ambulatory Visit: Payer: Self-pay | Admitting: Student in an Organized Health Care Education/Training Program

## 2022-05-17 DIAGNOSIS — F1190 Opioid use, unspecified, uncomplicated: Secondary | ICD-10-CM

## 2022-05-17 NOTE — Telephone Encounter (Signed)
Next appt scheduled 05/21/22 with PCP.

## 2022-05-18 ENCOUNTER — Other Ambulatory Visit (HOSPITAL_COMMUNITY): Payer: Self-pay

## 2022-05-18 MED ORDER — BUPRENORPHINE HCL-NALOXONE HCL 8-2 MG SL FILM
1.0000 | ORAL_FILM | Freq: Three times a day (TID) | SUBLINGUAL | 0 refills | Status: DC
  Filled 2022-05-18 – 2022-05-21 (×3): qty 90, 30d supply, fill #0
  Filled ????-??-??: fill #0

## 2022-05-21 ENCOUNTER — Ambulatory Visit (INDEPENDENT_AMBULATORY_CARE_PROVIDER_SITE_OTHER): Payer: Medicare HMO | Admitting: Student in an Organized Health Care Education/Training Program

## 2022-05-21 ENCOUNTER — Other Ambulatory Visit (HOSPITAL_COMMUNITY): Payer: Self-pay

## 2022-05-21 ENCOUNTER — Encounter: Payer: Self-pay | Admitting: Student in an Organized Health Care Education/Training Program

## 2022-05-21 VITALS — BP 141/96 | HR 104 | Temp 97.7°F | Ht 71.0 in | Wt 210.6 lb

## 2022-05-21 DIAGNOSIS — F1994 Other psychoactive substance use, unspecified with psychoactive substance-induced mood disorder: Secondary | ICD-10-CM

## 2022-05-21 DIAGNOSIS — I2510 Atherosclerotic heart disease of native coronary artery without angina pectoris: Secondary | ICD-10-CM

## 2022-05-21 DIAGNOSIS — I10 Essential (primary) hypertension: Secondary | ICD-10-CM | POA: Diagnosis not present

## 2022-05-21 DIAGNOSIS — F1190 Opioid use, unspecified, uncomplicated: Secondary | ICD-10-CM | POA: Diagnosis not present

## 2022-05-21 DIAGNOSIS — R911 Solitary pulmonary nodule: Secondary | ICD-10-CM | POA: Diagnosis not present

## 2022-05-21 DIAGNOSIS — Z87891 Personal history of nicotine dependence: Secondary | ICD-10-CM

## 2022-05-21 MED ORDER — BUPRENORPHINE HCL-NALOXONE HCL 8-2 MG SL FILM
1.0000 | ORAL_FILM | Freq: Three times a day (TID) | SUBLINGUAL | 0 refills | Status: DC
  Filled 2022-05-21: qty 90, 30d supply, fill #0

## 2022-05-21 NOTE — Progress Notes (Signed)
Established Patient Office Visit  Subjective   Patient ID: Joseph Ayala, male    DOB: 06/05/1966  Age: 56 y.o. MRN: [id number]  Chief Complaint  Patient presents with   Back Pain   feet pain    HPI  56 year old person here for follow-up of back and feet pain.  He has had recent visits with cardiology, cardiothoracic surgery, and neurology.  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  We reviewed the results of his recent CT of his chest showing stable thoracic aortic disease, coronary artery calcifications, and incidental pulmonary nodule.  We are planning for repeat imaging in 6 months.  Patient is enjoying a good functional quality of life.  He reports being active with no symptoms of angina.  No recent illness, cough, fever, or dyspnea with exertion.  Continues to have pain in both of his feet on a daily basis as well as low back pain.  These are not function limiting at this time.  Medication interventions such as Tylenol, ibuprofen, gabapentin, have not been helpful in the past.  Had some issues with recent low mood, missed an appointment with psychiatrist last month but has a future visit upcoming.  Patient did disclose to me that he has had a relapse and started using heroin again about a month ago.  Over the last 2 weeks he has been using heroin on a daily basis.  He says he buys about $100 worth of drugs and that lasted most of the week.  He has been inhaling heroin only.  He assumes that he would also has fentanyl and vitamins in it.  Does not think that heroin has cocaine in it.  Denies any injection drug use.  Has not had any recent overdoses or near overdoses.  He reports that he uses heroin because of addictive personality, that helps treat his chronic pain in his feet.  He does report also using Suboxone on a daily basis.  Says that he keeps his medications in a safe.    Objective:      BP (!) 141/96 (BP Location: Right Arm, Patient Position: Sitting, Cuff Size: Small)   Pulse (!) 104   Temp 97.7 F (36.5 C) (Oral)   Ht 5\' 11"  (1.803 m)   Wt 210 lb 9.6 oz (95.5 kg)   SpO2 99%   BMI 29.37 kg/m    Physical Exam  Gen: Well-appearing man, no distress CV: Regular rate and rhythm, 2 out of 6 early systolic murmur in the right sternal border Lungs: Clear throughout, no wheezing or crackles Extremities: Warm and well-perfused, trace pitting edema in bilateral lower extremities Psych: Appropriate affect, not depressed or anxious appearing, very open and forthcoming Neuro: Alert, conversational, full strength in upper and lower extremities     Assessment & Plan:   Problem List Items Addressed This Visit       High   Opioid use disorder - Primary (Chronic)    Currently uncontrolled.  Patient has ongoing intranasal heroin use on a daily basis over the last 2 weeks.  Was doing well for about 5 months after last residential rehab, but then about a month ago he had relapse after interruption in taking Suboxone.  Last urine tox assure was negative for Suboxone, tells me that he was out of medication for about 3 days.  Lost the films, did not disclose this in office visit.  Since that time he  reports taking it on a daily basis, says he last took it this morning, showed me his last film.  He reports a desire to continue using Suboxone, he is struggling with motivation and addiction.  Using a small amount of heroin, no recent overdose or near overdose.  Overall I still think it is risk reduction to continue using Suboxone therapy even in light of active drug use.  Likely lowering his risk of overdose.  We set a goal of using daily Suboxone over the next 1 month increasing the number of days that he does not use heroin.  I counseled him that his drug use continues to put him at risk for ischemic cardiac events, aortic artery aneurysm rupture, and a recurrent pneumonia which resulted in  a critical illness last year.  Patient understands these risks, continues to struggle.      Relevant Medications   Buprenorphine HCl-Naloxone HCl 8-2 MG FILM   Other Relevant Orders   ToxAssure Select,+Antidepr,UR   Substance induced mood disorder (HCC) (Chronic)    Patient missed his last appointment with psychiatry, though has an upcoming scheduled appointment in a couple weeks.  He stopped taking Lamictal and trazodone.  Denies depressed mood today.  Has ongoing substance use issues which are very likely to impact his mood in the future.  I encouraged him to follow back up with psychiatry, I think treating his underlying psychiatric disorder will prove more successful in managing the comorbid substance use.      Coronary artery disease (Chronic)    CT angiography of the coronaries showed minimal nonobstructive coronary artery disease with high calcification burden.  Currently patient is not having any symptoms of angina, has a good functional status and exertional capacity.  More limited by his neuromuscular complaints.  We talked about better management of hypertension.  LDL is currently at 62, intolerant of statins due to chronic myositis.  We talked about ongoing risk factor modification.      Pulmonary nodule (Chronic)    CT angiogram of his thoracic aortic aneurysm in October 2023 incidentally showed a "vague right upper lobe peribronchovascular ground-glass airspace opacity in cystic changes. Associated scattered right upper lobe ground-glass airspace opacities that are nodular-like measuring up to 9 mFinding may represent infection/inflammation. Non-contrast chest CT at 3-6 months is recommended."  Patient is without any symptoms of infection.  I suspect this is related to ongoing inhaled heroin use.  Patient is relatively low risk for pulmonary malignancy.  We will plan for repeat CT chest at the 18-month mark to monitor the thoracic aortic aneurysm anyways.        Medium     Hypertension (Chronic)    Hypertension a little above goal today.  Patient was using chlorthalidone regularly, only as needed for lower extremity swelling.  I recommended that we use chlorthalidone 25 mg every day for antihypertensive effect as well.  Continue with metoprolol succinate 50 mg daily given the thoracic aortic aneurysm.       Return in about 4 weeks (around 06/18/2022).    [name], MD

## 2022-05-21 NOTE — Assessment & Plan Note (Signed)
Patient missed his last appointment with psychiatry, though has an upcoming scheduled appointment in a couple weeks.  He stopped taking Lamictal and trazodone.  Denies depressed mood today.  Has ongoing substance use issues which are very likely to impact his mood in the future.  I encouraged him to follow back up with psychiatry, I think treating his underlying psychiatric disorder will prove more successful in managing the comorbid substance use.

## 2022-05-21 NOTE — Assessment & Plan Note (Addendum)
CT angiography of the coronaries showed minimal nonobstructive coronary artery disease with high calcification burden.  Currently patient is not having any symptoms of angina, has a good functional status and exertional capacity.  More limited by his neuromuscular complaints.  We talked about better management of hypertension.  LDL is currently at 62, intolerant of statins due to chronic myositis.  We talked about ongoing risk factor modification.

## 2022-05-21 NOTE — Assessment & Plan Note (Signed)
Currently uncontrolled.  Patient has ongoing intranasal heroin use on a daily basis over the last 2 weeks.  Was doing well for about 5 months after last residential rehab, but then about a month ago he had relapse after interruption in taking Suboxone.  Last urine tox assure was negative for Suboxone, tells me that he was out of medication for about 3 days.  Lost the films, did not disclose this in office visit.  Since that time he reports taking it on a daily basis, says he last took it this morning, showed me his last film.  He reports a desire to continue using Suboxone, he is struggling with motivation and addiction.  Using a small amount of heroin, no recent overdose or near overdose.  Overall I still think it is risk reduction to continue using Suboxone therapy even in light of active drug use.  Likely lowering his risk of overdose.  We set a goal of using daily Suboxone over the next 1 month increasing the number of days that he does not use heroin.  I counseled him that his drug use continues to put him at risk for ischemic cardiac events, aortic artery aneurysm rupture, and a recurrent pneumonia which resulted in a critical illness last year.  Patient understands these risks, continues to struggle.

## 2022-05-21 NOTE — Assessment & Plan Note (Signed)
Hypertension a little above goal today.  Patient was using chlorthalidone regularly, only as needed for lower extremity swelling.  I recommended that we use chlorthalidone 25 mg every day for antihypertensive effect as well.  Continue with metoprolol succinate 50 mg daily given the thoracic aortic aneurysm.

## 2022-05-21 NOTE — Assessment & Plan Note (Signed)
CT angiogram of his thoracic aortic aneurysm in October 2023 incidentally showed a "vague right upper lobe peribronchovascular ground-glass airspace opacity in cystic changes. Associated scattered right upper lobe ground-glass airspace opacities that are nodular-like measuring up to 9 mFinding may represent infection/inflammation. Non-contrast chest CT at 3-6 months is recommended."  Patient is without any symptoms of infection.  I suspect this is related to ongoing inhaled heroin use.  Patient is relatively low risk for pulmonary malignancy.  We will plan for repeat CT chest at the 62-month mark to monitor the thoracic aortic aneurysm anyways.

## 2022-05-24 ENCOUNTER — Other Ambulatory Visit (HOSPITAL_COMMUNITY): Payer: Self-pay

## 2022-05-24 ENCOUNTER — Other Ambulatory Visit: Payer: Self-pay | Admitting: Student in an Organized Health Care Education/Training Program

## 2022-05-24 MED ORDER — TESTOSTERONE CYPIONATE 200 MG/ML IM SOLN
400.0000 mg | INTRAMUSCULAR | 1 refills | Status: DC
  Filled 2022-05-24: qty 4, 28d supply, fill #0
  Filled 2022-06-20 – 2022-06-21 (×2): qty 4, 28d supply, fill #1

## 2022-05-25 ENCOUNTER — Other Ambulatory Visit (HOSPITAL_COMMUNITY): Payer: Self-pay

## 2022-05-25 LAB — TOXASSURE SELECT,+ANTIDEPR,UR

## 2022-06-06 ENCOUNTER — Other Ambulatory Visit (HOSPITAL_COMMUNITY): Payer: Self-pay

## 2022-06-15 ENCOUNTER — Other Ambulatory Visit: Payer: Self-pay | Admitting: Student in an Organized Health Care Education/Training Program

## 2022-06-15 ENCOUNTER — Other Ambulatory Visit (HOSPITAL_COMMUNITY): Payer: Self-pay

## 2022-06-15 DIAGNOSIS — F1190 Opioid use, unspecified, uncomplicated: Secondary | ICD-10-CM

## 2022-06-15 MED ORDER — BUPRENORPHINE HCL-NALOXONE HCL 8-2 MG SL FILM
1.0000 | ORAL_FILM | Freq: Three times a day (TID) | SUBLINGUAL | 0 refills | Status: DC
  Filled 2022-06-15 – 2022-06-18 (×2): qty 90, 30d supply, fill #0

## 2022-06-18 ENCOUNTER — Other Ambulatory Visit (HOSPITAL_COMMUNITY): Payer: Self-pay

## 2022-06-20 ENCOUNTER — Other Ambulatory Visit: Payer: Self-pay | Admitting: Student in an Organized Health Care Education/Training Program

## 2022-06-20 ENCOUNTER — Other Ambulatory Visit (HOSPITAL_COMMUNITY): Payer: Self-pay

## 2022-06-20 MED ORDER — SILDENAFIL CITRATE 25 MG PO TABS
25.0000 mg | ORAL_TABLET | ORAL | 1 refills | Status: DC | PRN
  Filled 2022-06-20 – 2023-05-31 (×2): qty 20, 20d supply, fill #0
  Filled 2023-05-31: qty 20, 20d supply, fill #1

## 2022-06-21 ENCOUNTER — Other Ambulatory Visit (HOSPITAL_COMMUNITY): Payer: Self-pay

## 2022-07-10 ENCOUNTER — Other Ambulatory Visit (HOSPITAL_COMMUNITY): Payer: Self-pay

## 2022-07-10 ENCOUNTER — Encounter: Payer: Self-pay | Admitting: Student in an Organized Health Care Education/Training Program

## 2022-07-10 ENCOUNTER — Other Ambulatory Visit: Payer: Self-pay | Admitting: Student in an Organized Health Care Education/Training Program

## 2022-07-10 DIAGNOSIS — F1190 Opioid use, unspecified, uncomplicated: Secondary | ICD-10-CM

## 2022-07-10 MED ORDER — BUPRENORPHINE HCL-NALOXONE HCL 8-2 MG SL FILM
1.0000 | ORAL_FILM | Freq: Three times a day (TID) | SUBLINGUAL | 0 refills | Status: DC
  Filled 2022-07-10 (×3): qty 90, 30d supply, fill #0

## 2022-07-10 NOTE — Telephone Encounter (Signed)
I spoke with Joseph Ayala by phone. He is working on going to a residential rehab later in the week. He has no concerns right now about safety, depression, or SI. He was coherent and conversational. We talked about rehab options. I sent him resources to find a rehab center locally. I recommended he choose one that allows for medication assisted treatment. I recommended we consider switching from suboxone to methadone after he finishes rehab, as he seems to need a higher level of care for his OUD. I sent a 30 day supply of suboxone to his pharmacy, which hopefully he can bring and continue to use at the rehab center.

## 2022-07-11 ENCOUNTER — Other Ambulatory Visit (HOSPITAL_COMMUNITY): Payer: Self-pay

## 2022-07-13 ENCOUNTER — Other Ambulatory Visit: Payer: Self-pay | Admitting: Student in an Organized Health Care Education/Training Program

## 2022-07-13 ENCOUNTER — Other Ambulatory Visit (HOSPITAL_COMMUNITY): Payer: Self-pay

## 2022-07-15 MED ORDER — METOPROLOL SUCCINATE ER 50 MG PO TB24
50.0000 mg | ORAL_TABLET | Freq: Every day | ORAL | 3 refills | Status: DC
  Filled 2022-07-15: qty 90, 90d supply, fill #0
  Filled 2022-11-27: qty 90, 90d supply, fill #1
  Filled 2023-01-28 – 2023-02-28 (×3): qty 90, 90d supply, fill #2
  Filled 2023-06-18: qty 90, 90d supply, fill #3

## 2022-07-16 ENCOUNTER — Other Ambulatory Visit: Payer: Self-pay

## 2022-07-16 ENCOUNTER — Other Ambulatory Visit (HOSPITAL_COMMUNITY): Payer: Self-pay

## 2022-07-16 ENCOUNTER — Other Ambulatory Visit: Payer: Self-pay | Admitting: Cardiology

## 2022-07-20 ENCOUNTER — Other Ambulatory Visit (HOSPITAL_COMMUNITY): Payer: Self-pay

## 2022-07-20 ENCOUNTER — Other Ambulatory Visit: Payer: Self-pay | Admitting: Student in an Organized Health Care Education/Training Program

## 2022-07-20 MED ORDER — TESTOSTERONE CYPIONATE 200 MG/ML IM SOLN
400.0000 mg | INTRAMUSCULAR | 1 refills | Status: DC
  Filled 2022-07-20: qty 4, 28d supply, fill #0
  Filled 2022-08-16: qty 4, 28d supply, fill #1

## 2022-07-22 NOTE — Progress Notes (Unsigned)
Cardiology Office Note:    Date:  04/11/2022   ID:  Joseph Ayala, DOB August 14, 1965, MRN [id number]  PCP:  [name], MD  Cardiologist:  [name], MD  Electrophysiologist:  None   Referring MD: [name]   No chief complaint on file.   History of Present Illness:    Joseph Ayala is a 57 y.o. 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  He was admitted on 08/27/2020 with shortness of breath.  Found to be hypoxic, chest x-ray showed right lower lobe pneumonia.  CTPA showed no PE but dense area of consolidation and groundglass opacity in right middle and lower lobes.  Developed worsening hypoxia requiring intubation on 2/20.  Was found to have septic shock requiring pressor support.  Respiratory cultures grew MRSA.  Hospital course was complicated by atrial fibrillation, which is a new diagnosis.  Echocardiogram on 2/22 showed EF 35 to 40%, mild RV dysfunction.  He was started on amiodarone and converted to normal sinus rhythm on 2/22.  He required chest tube placement for right pleural effusion.  He was admitted from 2/19 through 09/29/2020.  He required tracheostomy but was removed on 3/22 he was discharged on oral linezolid and MRSA pneumonia.  He was discharged on metoprolol, Lasix, and lisinopril.  TEE on 09/16/2020 showed no evidence of endocarditis.  Likely small PFO.  EF 45 to 50%, low normal RV function.  Also noted on CT to have 4.6 cm thoracic aortic aneurysm.  Cardiac monitor x2 weeks showed no significant arrhythmias on 12/20/2020.  Echocardiogram 12/25/2021 showed EF 60 to 65%, normal RV function, no significant valvular disease, ascending aortic dilatation measuring 43 mm.  Coronary CTA on 04/26/2022 showed nonobstructive CAD, calcium score 271 (92nd percentile), dilated ascending aorta measuring 44 mm, right upper lobe pulmonary nodule measuring  1.6 cm.  [employer] monitor x 1 week on 05/02/2022 showed no significant arrhythmias.  Since last clinic visit, ***CT chest he reports he has been having chest pain.  Describes as sharp stabbing pain on left side of chest that radiates to left arm, lasts for up to 15 seconds.  Can occur when he exerts himself but also does happen with rest.  Also with dyspnea on exertion.  Also is having palpitations nearly daily where feels like heart is racing.  Past Medical History:  Diagnosis Date   Acute HFmrEF (heart failure with mildly reduced ejection fraction) (HCC) 10/31/2020   Acute hypoxemic respiratory failure (HCC)    Acute respiratory failure with hypoxia (HCC) 08/28/2020   CAP (community acquired pneumonia) 08/28/2020   Degenerative disc disease, lumbar    Dental crown present    ETOH abuse    History of kidney stones    Hypertension    states under control with meds., has been on med. x 1 yr. - is currently out of med., to see PCP 02/06/2018   Marijuana use, continuous    MRSA pneumonia (HCC) 09/09/2020   Muscle atrophy 01/2018   Muscle weakness 01/2018   Necrotizing myopathy    Necrotizing pneumonia (HCC) 09/09/2020   Neuropathic pain    Opioid use disorder    PAF (paroxysmal atrial fibrillation) (HCC)    Pleural effusion    Sepsis (HCC) 08/28/2020   Thoracic aortic aneurysm (TAA) (HCC)    a. seen on CT 08/2020.    Past Surgical History:  Procedure Laterality Date   ENDOVENOUS ABLATION  SAPHENOUS VEIN W/ LASER Right 12-29-2015   endovenous laser ablation right greater saphenous vein, stab phlebectomy > 20 incisions right leg, sclerotherapy right leg by Gretta Began MD     Ayala MUSCLE BIOPSY Right 02/11/2018   Procedure: RECTUS FEMORIS MUSCLE BIOPSY;  Surgeon: Darnell Level, MD;  Location: [facility];  Service: General;  Laterality: Right;   MUSCLE BIOPSY Right 02/11/2018   Procedure: DELTOID MUSCLE BIOPSY;  Surgeon: Darnell Level, MD;  Location: [facility];   Service: General;  Laterality: Right;   NASAL FRACTURE SURGERY     TRACHEOSTOMY TUBE PLACEMENT N/A 09/14/2020   Procedure: TRACHEOSTOMY;  Surgeon: Suzanna Obey, MD;  Location: [facility];  Service: ENT;  Laterality: N/A;    Current Medications: Current Meds  Medication Sig   acetaminophen (TYLENOL) 500 MG tablet Take 1,500 mg by mouth every morning.   Ascorbic Acid (VITAMIN C) 100 MG tablet Take 100 mg by mouth daily.   Buprenorphine HCl-Naloxone HCl 8-2 MG FILM Place 1 Film under the tongue in the morning, at noon, and at bedtime.   chlorthalidone (HYGROTON) 25 MG tablet Take 1 tablet (25 mg total) by mouth daily.   ciclopirox (PENLAC) 8 % solution Apply topically at bedtime. Apply over nail and surrounding skin. Apply daily over previous coat. After seven (7) days, may remove with alcohol and continue cycle.   ezetimibe (ZETIA) 10 MG tablet Take 1 tablet (10 mg total) by mouth daily.   ibuprofen (ADVIL) 200 MG tablet Take 600 mg by mouth every morning.   lamoTRIgine (LAMICTAL) 100 MG tablet Take 1 tablet by mouth everyday   metoprolol succinate (TOPROL-XL) 50 MG 24 hr tablet Take 1 tablet (50 mg total) by mouth daily. Take with or immediately following a meal.   metoprolol tartrate (LOPRESSOR) 100 MG tablet Take 1 tablet (100mg ) TWO hours prior to CT scan   Multiple Vitamin (MULTIVITAMIN WITH MINERALS) TABS tablet Take 1 tablet by mouth daily.   sildenafil (VIAGRA) 25 MG tablet Take 1 tablet (25 mg total) by mouth as needed for erectile dysfunction.   testosterone cypionate (DEPOTESTOSTERONE CYPIONATE) 200 MG/ML injection Inject 2 mLs (400 mg total) into the muscle every 14 (fourteen) days.   traZODone (DESYREL) 50 MG tablet Take 1-2 tablets (50-100 mg total) by mouth at bedtime as needed for sleep   zinc gluconate 50 MG tablet Take 50 mg by mouth daily.     Allergies:   Lyrica cr [pregabalin er], Vicodin [hydrocodone-acetaminophen], and Quinolones   Social History   Socioeconomic History    Marital status: Married    Spouse name: Not on file   Number of children: 1   Years of education: 13   Highest education level: Some college, no degree  Occupational History   Occupation: not working  Tobacco Use   Smoking status: Never   Smokeless tobacco: Former  Use: Never used  Substance and Sexual Activity   Alcohol use: Not Currently    Comment: occasionally   Drug use: No   Sexual activity: Yes    Partners: Female    Birth control/protection: None  Other Topics Concern   Not on file  Social History Narrative   Lives with wife and daughter in a one story home.  Has one child.  Currently not working.  Education: some college.    Social Determinants of Health   Financial Resource Strain: Low Risk  (03/19/2022)   Overall Financial Resource Strain (CARDIA)  Difficulty of Paying Living Expenses: Not hard at all  Food Insecurity: No Food Insecurity (03/19/2022)   Hunger Vital Sign    Worried About Running Out of Food in the Last Year: Never true    Ran Out of Food in the Last Year: Never true  Transportation Needs: No Transportation Needs (03/27/2022)   PRAPARE - Administrator, Civil Service (Medical): No    Lack of Transportation (Non-Medical): No  Physical Activity: Inactive (03/19/2022)   Exercise Vital Sign    Days of Exercise per Week: 0 days    Minutes of Exercise per Session: 0 min  Stress: No Stress Concern Present (03/19/2022)   [employer] of Occupational Health - Occupational Stress Questionnaire    Feeling of Stress : Not at all  Social Connections: Moderately Isolated (03/19/2022)   Social Connection and Isolation Panel [NHANES]    Frequency of Communication with Friends and Family: Twice a week    Frequency of Social Gatherings with Friends and Family: Twice a week    Attends Religious Services: Never    Database administrator or Organizations: No    Attends Engineer, structural: Never    Marital Status:  Married     Family History: The patient's family history includes Drug abuse in his brother; Varicose Veins in his brother.  ROS:   Please see the history of present illness.     All other systems reviewed and are negative.  EKGs/Labs/Other Studies Reviewed:    The following studies were reviewed today:   EKG:   04/11/2022: Normal sinus rhythm, rate 90 LVH  11/10/2020: NSR. Rate 89 bpm. LVH.  Echo 08/30/20: 1. Left ventricular ejection fraction, by estimation, is 35 to 40%. The  left ventricle has moderately decreased function. The left ventricle  demonstrates global hypokinesis. The left ventricular internal cavity size  was moderately dilated. Left  ventricular diastolic parameters are indeterminate.   2. Right ventricular systolic function is mildly reduced. The right  ventricular size is moderately enlarged. There is mildly elevated  pulmonary artery systolic pressure.   3. Left atrial size was mildly dilated.   4. Right atrial size was moderately dilated.   5. The mitral valve is normal in structure. Mild mitral valve  regurgitation. No evidence of mitral stenosis.   6. The aortic valve has an indeterminant number of cusps. There is mild  calcification of the aortic valve. Aortic valve regurgitation is not  visualized. Mild aortic valve sclerosis is present, with no evidence of  aortic valve stenosis.   7. Aortic dilatation noted. There is mild to moderate dilatation of the  ascending aorta, measuring 44 mm.   8. The inferior vena cava is dilated in size with <50% respiratory  variability, suggesting right atrial pressure of 15 mmHg.   Lower Venous DVT Study 08/30/20: RIGHT:  - There is no evidence of deep vein thrombosis in the lower extremity.     - No cystic structure found in the popliteal fossa.  - Triphasic waveforms are noted in the posterior tibial, peroneal, and  anterior tibial arteries.     LEFT:  - There is no evidence of deep vein thrombosis in the  lower extremity.     - No cystic structure found in the popliteal fossa.  - Triphasic waveforms are noted in the posterior tibial, peroneal, and  anterior tibial arteries.   TEE 09/16/20:  1. Left ventricular ejection fraction, by estimation, is 45 to 50%. The  left ventricle  has mildly decreased function.   2. Right ventricular systolic function is low normal. The right  ventricular size is normal.   3. No left atrial/left atrial appendage thrombus was detected.   4. The mitral valve is normal in structure. Trivial mitral valve  regurgitation. No evidence of mitral stenosis.   5. The aortic valve is tricuspid. Aortic valve regurgitation is not  visualized. No aortic stenosis is present.   6. Evidence of atrial level shunting detected by color flow Doppler.  There is a small patent foramen ovale with predominantly left to right  shunting across the atrial septum.  LOWER EXTREMITY DOPPLER STUDY 10/12/20: Right: Resting right ankle-brachial index indicates noncompressible right  lower extremity arteries.   Waveforms are strong triphasic.  The right toe-brachial index is normal.   Left: Resting left ankle-brachial index indicates noncompressible left  lower extremity arteries.   Waveforms are strong triphasic.  The left toe-brachial index is normal.  Recent Labs: 08/21/2021: ALT 128 09/20/2021: B Natriuretic Peptide 6.7 03/26/2022: BUN 22; Creatinine, Ser 0.69; Hemoglobin 16.2; Platelets 157; Potassium 3.5; Sodium 139  Recent Lipid Panel    Component Value Date/Time   CHOL 260 (H) 08/21/2021 1040   TRIG 217 (H) 08/21/2021 1040   HDL 57 08/21/2021 1040   CHOLHDL 4.6 08/21/2021 1040   LDLCALC 163 (H) 08/21/2021 1040    Physical Exam:    VS:  BP 116/84   Pulse (!) 106   Ht 5\' 11"  (1.803 m)   Wt 207 lb 9.6 oz (94.2 kg)   SpO2 99%   BMI 28.95 kg/m     Wt Readings from Last 3 Encounters:  04/11/22 207 lb 9.6 oz (94.2 kg)  03/14/22 211 lb 9.6 oz (96 kg)  01/01/22 205 lb  1.6 oz (93 kg)     GEN: Well nourished, well developed in no acute distress HEENT: Normal NECK: No JVD; No carotid bruits LYMPHATICS: No lymphadenopathy CARDIAC: RRR, no murmurs, rubs, gallops RESPIRATORY:  Clear to auscultation without rales, wheezing or rhonchi  ABDOMEN: Soft, non-tender, non-distended MUSCULOSKELETAL:  No edema; No deformity  SKIN: Warm and dry NEUROLOGIC:  Alert and oriented x 3 PSYCHIATRIC:  Normal affect   ASSESSMENT:    1. Chest pain of uncertain etiology   2. Atrial fibrillation, unspecified type (HCC)   3. Acute combined systolic (congestive) and diastolic (congestive) heart failure (HCC)   4. Thoracic aortic aneurysm without rupture, unspecified part (HCC)   5. Essential hypertension     PLAN:    CAD: Reported atypical chest pain. Coronary CTA on 04/26/2022 showed nonobstructive CAD, calcium score 271 (92nd percentile), dilated ascending aorta measuring 44 mm, right upper lobe pulmonary nodule measuring 1.6 cm.    Atrial fibrillation: Occurred in setting of acute illness.  Started on Eliquis 5 mg twice daily -Suspect A. fib was due to critical illness and will likely not need long-term anticoagulation.  Cardiac monitor x2 weeks showed no significant arrhythmias on 12/20/2020.  Eliquis discontinued.   -Reported palpitations and repeat monitor was done 05/02/2022 x 1 week, which showed no significant arrhythmias.   Acute combined systolic and diastolic heart failure: EF 35 to 40% 08/2020.  TEE 09/16/20 showed improvement in EF to 45 to 50%.  On metoprolol 12.5 mg twice daily, Lasix 40 mg daily.  Echocardiogram 12/25/2021 showed EF 60 to 65%, normal RV function, no significant valvular disease, ascending aortic dilatation measuring 43 mm.  Thoracic aortic aneurysm: Measured 4.6 cm.  Follows with thoracic surgery, most recent chest  CT 04/2021 showed stable aneurysm.  Coronary CTA 04/2022 showed stable aortic dilatation measuring 4.4 cm  Hypertension: on  chlorthalidone 25 mg daily.  Appears controlled  Pulmonary nodule: Right upper lobe pulmonary nodule measuring 1.6 cm on coronary CTA 04/26/2022.  Follow-up CT chest recommended in 6 months.  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  Concerns regarding medicines are outlined above.  Orders Placed This Encounter  Procedures   CT CORONARY MORPH W/CTA COR W/SCORE W/CA W/CM &/OR WO/CM   Basic metabolic panel   CARDIAC EVENT MONITOR   EKG 12-Lead   Meds ordered this encounter  Medications   metoprolol tartrate (LOPRESSOR) 100 MG tablet    Sig: Take 1 tablet (100mg ) TWO hours prior to CT scan    Dispense:  1 tablet    Refill:  0    Patient Instructions  Medication Instructions:  Your physician recommends that you continue on your current medications as directed. Please refer to the Current Medication list given to you today.  *If you need a refill on your cardiac medications before your next appointment, please call your pharmacy*   Lab Work: BMET today  If you have labs (blood work) drawn today and your tests are completely normal, you will receive your results only by: [place] (if you have MyChart) OR A paper copy in the mail If you have any lab test that is abnormal or we need to change your treatment, we will call you to review the results.   Testing/Procedures: Coronary CTA-see instructions below  [employer] Cardiac Event Monitor Instructions Your physician has requested you wear your cardiac event monitor for __7__ days, (1-30). [employer] may call or text to confirm a shipping address. The monitor will be sent to a land address via UPS. [employer] will not ship a monitor to a PO BOX. It typically takes 3-5 days to receive your monitor after it has been enrolled. [employer] will assist with  USPS tracking if your package is delayed. The telephone number for [employer] is [phone]. Once you have received your monitor, please review the enclosed instructions. Instruction tutorials can also be viewed under help and settings on the enclosed cell phone. Your monitor has already been registered assigning a specific monitor serial # to you.  Applying the monitor Remove cell phone from case and turn it on. The cell phone works as Dealer and needs to be within [employer] of you at all times. The cell phone will need to be charged on a daily basis. We recommend you plug the cell phone into the enclosed charger at your bedside table every night.  Monitor batteries: You will receive two monitor batteries labelled #1 and #2. These are your recorders. Plug battery #2 onto the second connection on the enclosed charger. Keep one battery on the charger at all times. This will keep the monitor battery deactivated. It will also keep it fully charged for when you need to switch your monitor batteries. A small light will be blinking on the battery emblem when it is charging. The light on the battery emblem will remain on when the battery is fully charged.  Open package of a Monitor strip. Insert battery #1 into black hood on strip and gently squeeze monitor battery onto connection as indicated in instruction booklet. Set aside while preparing skin.  Choose location for  your strip, vertical or horizontal, as indicated in the instruction booklet. Shave to remove all hair from location. There cannot be any lotions, oils, powders, or colognes on skin where monitor is to be applied. Wipe skin clean with enclosed Saline wipe. Dry skin completely.  Peel paper labeled #1 off the back of the Monitor strip exposing the adhesive. Place the monitor on the chest in the vertical or horizontal position shown in the instruction booklet. One arrow on the monitor strip must be pointing upward. Carefully  remove paper labeled #2, attaching remainder of strip to your skin. Try not to create any folds or wrinkles in the strip as you apply it.  Firmly press and release the circle in the center of the monitor battery. You will hear a small beep. This is turning the monitor battery on. The heart emblem on the monitor battery will light up every 5 seconds if the monitor battery in turned on and connected to the patient securely. Do not push and hold the circle down as this turns the monitor battery off. The cell phone will locate the monitor battery. A screen will appear on the cell phone checking the connection of your monitor strip. This may read poor connection initially but change to good connection within the next minute. Once your monitor accepts the connection you will hear a series of 3 beeps followed by a climbing crescendo of beeps. A screen will appear on the cell phone showing the two monitor strip placement options. Touch the picture that demonstrates where you applied the monitor strip.  Your monitor strip and battery are waterproof. You are able to shower, bathe, or swim with the monitor on. They just ask you do not submerge deeper than 3 feet underwater. We recommend removing the monitor if you are swimming in a lake, river, or ocean.  Your monitor battery will need to be switched to a fully charged monitor battery approximately once a week. The cell phone will alert you of an action which needs to be made.  On the cell phone, tap for details to reveal connection status, monitor battery status, and cell phone battery status. The green dots indicates your monitor is in good status. A red dot indicates there is something that needs your attention.  To record a symptom, click the circle on the monitor battery. In 30-60 seconds a list of symptoms will appear on the cell phone. Select your symptom and tap save. Your monitor will record a sustained or significant arrhythmia regardless of  you clicking the button. Some patients do not feel the heart rhythm irregularities. [employer] will notify us of any serious or critical events.  Refer to instruction booklet for instructions on switching batteries, changing strips, the Do not disturb or Pause features, or any additional questions.  Call [employer] at [phone], to confirm your monitor is transmitting and record your baseline. They will answer any questions you may have regarding the monitor instructions at that time.  Returning the monitor to [employer] Place all equipment back into blue box. Peel off strip of paper to expose adhesive and close box securely. There is a prepaid UPS shipping label on this box. Drop in a UPS drop box, or at a UPS facility like [employer]. You may also contact [employer] to arrange UPS to pick up monitor package at your home.   Follow-Up: At [facility], you and your health needs are our priority.  As part of our continuing mission to provide you with exceptional heart  care, we have created designated Provider Care Teams.  These Care Teams include your primary Cardiologist (physician) and Advanced Practice Providers (APPs -  Physician Assistants and Nurse Practitioners) who all work together to provide you with the care you need, when you need it.  We recommend signing up for the patient portal called "MyChart".  Sign up information is provided on this After Visit Summary.  MyChart is used to connect with patients for Virtual Visits (Telemedicine).  Patients are able to view lab/test results, encounter notes, upcoming appointments, etc.  Non-urgent messages can be sent to your provider as well.   To learn more about what you can do with MyChart, go to [email or web address].    Your next appointment:   3 month(s)  The format for your next appointment:   In Person  Provider:   [name], MD     Other Instructions   Your cardiac CT will be scheduled at one of the  below locations:   [facility] [address] [place], [place] [address] [phone]  If scheduled at [facility], please arrive at the [facility] and Children's Entrance (Entrance C2) of [facility] 30 minutes prior to test start time. You can use the FREE valet parking offered at entrance C (encouraged to control the heart rate for the test)  Proceed to the [facility] Radiology Department (first floor) to check-in and test prep.  All radiology patients and guests should use entrance C2 at [facility], accessed from [facility], even though the hospital's physical address listed is [address]    Please follow these instructions carefully (unless otherwise directed):  Hold all erectile dysfunction medications at least 3 days (72 hrs) prior to test. (Ie viagra, cialis, sildenafil, tadalafil, etc) We will administer nitroglycerin during this exam.   On the Night Before the Test: Be sure to Drink plenty of water. Do not consume any caffeinated/decaffeinated beverages or chocolate 12 hours prior to your test. Do not take any antihistamines 12 hours prior to your test.  On the Day of the Test: Drink plenty of water until 1 hour prior to the test. Do not eat any food 1 hour prior to test. You may take your regular medications prior to the test.  Take metoprolol (Lopressor) two hours prior to test. Hold chlorthalidone AM of CT      After the Test: Drink plenty of water. After receiving IV contrast, you may experience a mild flushed feeling. This is normal. On occasion, you may experience a mild rash up to 24 hours after the test. This is not dangerous. If this occurs, you can take Benadryl 25 mg and increase your fluid intake. If you experience trouble breathing, this can be serious. If it is severe call 911 IMMEDIATELY. If it is mild, please call our office. If you take any of these medications: Glipizide/Metformin,  Avandament, Glucavance, please do not take 48 hours after completing test unless otherwise instructed.  We will call to schedule your test 2-4 weeks out understanding that some insurance companies will need an authorization prior to the service being performed.   For non-scheduling related questions, please contact the cardiac imaging nurse navigator should you have any questions/concerns: Rockwell [place], Cardiac Imaging Nurse Navigator Larey Brick, Cardiac Imaging Nurse Navigator [facility] Direct Office Dial: [phone]   For scheduling needs, including cancellations and rescheduling, please call [place], [phone].  Signed, Donato Heinz, MD  04/11/2022 9:38 AM    [place]

## 2022-07-23 ENCOUNTER — Encounter: Payer: Self-pay | Admitting: Cardiology

## 2022-07-23 ENCOUNTER — Ambulatory Visit: Payer: Medicare HMO | Attending: Cardiology | Admitting: Cardiology

## 2022-07-23 VITALS — BP 100/72 | HR 81 | Ht 71.0 in | Wt 200.4 lb

## 2022-07-23 DIAGNOSIS — E7404 McArdle disease: Secondary | ICD-10-CM

## 2022-07-23 DIAGNOSIS — I2510 Atherosclerotic heart disease of native coronary artery without angina pectoris: Secondary | ICD-10-CM

## 2022-07-23 DIAGNOSIS — I4891 Unspecified atrial fibrillation: Secondary | ICD-10-CM

## 2022-07-23 DIAGNOSIS — E785 Hyperlipidemia, unspecified: Secondary | ICD-10-CM | POA: Diagnosis not present

## 2022-07-23 DIAGNOSIS — R911 Solitary pulmonary nodule: Secondary | ICD-10-CM | POA: Diagnosis not present

## 2022-07-23 DIAGNOSIS — I5041 Acute combined systolic (congestive) and diastolic (congestive) heart failure: Secondary | ICD-10-CM | POA: Diagnosis not present

## 2022-07-23 LAB — COMPREHENSIVE METABOLIC PANEL
ALT: 66 IU/L — ABNORMAL HIGH (ref 0–44)
AST: 59 IU/L — ABNORMAL HIGH (ref 0–40)
Albumin/Globulin Ratio: 2.5 — ABNORMAL HIGH (ref 1.2–2.2)
Albumin: 4.8 g/dL (ref 3.8–4.9)
Alkaline Phosphatase: 77 IU/L (ref 44–121)
BUN/Creatinine Ratio: 39 — ABNORMAL HIGH (ref 9–20)
BUN: 28 mg/dL — ABNORMAL HIGH (ref 6–24)
Bilirubin Total: 0.6 mg/dL (ref 0.0–1.2)
CO2: 27 mmol/L (ref 20–29)
Calcium: 10.3 mg/dL — ABNORMAL HIGH (ref 8.7–10.2)
Chloride: 100 mmol/L (ref 96–106)
Creatinine, Ser: 0.72 mg/dL — ABNORMAL LOW (ref 0.76–1.27)
Globulin, Total: 1.9 g/dL (ref 1.5–4.5)
Glucose: 97 mg/dL (ref 70–99)
Potassium: 4.6 mmol/L (ref 3.5–5.2)
Sodium: 139 mmol/L (ref 134–144)
Total Protein: 6.7 g/dL (ref 6.0–8.5)
eGFR: 109 mL/min/{1.73_m2} (ref >59)

## 2022-07-23 NOTE — Patient Instructions (Signed)
Medication Instructions:  STOP Zetia  *If you need a refill on your cardiac medications before your next appointment, please call your pharmacy*   Lab Work: CMET today    Please return for FASTING labs in 1 month (Lipid)  Our in office lab hours are Monday-Friday 8:00-4:00, closed for lunch 12:45-1:45 pm.  No appointment needed.  [facility] locations:   [place] - [address] [place] - [address] [place] - [address])  If you have labs (blood work) drawn today and your tests are completely normal, you will receive your results only by: [employer] (if you have MyChart) OR A paper copy in the mail If you have any lab test that is abnormal or we need to change your treatment, we will call you to review the results.   Testing/Procedures: CT chest without contrast in April 2024-[facility] will call closer to time to schedule.     Follow-Up: At [facility], you and your health needs are our priority.  As part of our continuing mission to provide you with exceptional heart care, we have created designated Provider Care Teams.  These Care Teams include your primary Cardiologist (physician) and Advanced Practice Providers (APPs -  Physician Assistants and Nurse Practitioners) who all work together to provide you with the care you need, when you need it.  We recommend signing up for the patient portal called "MyChart".  Sign up information is provided on this After Visit Summary.  MyChart is used to connect with patients for Virtual Visits (Telemedicine).  Patients are able to view lab/test results, encounter notes, upcoming appointments, etc.   Non-urgent messages can be sent to your provider as well.   To learn more about what you can do with MyChart, go to [email or web address].    Your next appointment:   6 week(s) with pharmacist (Lipid clinic) 6 months with Dr. Gardiner Rhyme

## 2022-07-27 ENCOUNTER — Other Ambulatory Visit (HOSPITAL_COMMUNITY): Payer: Self-pay

## 2022-07-31 ENCOUNTER — Emergency Department (HOSPITAL_BASED_OUTPATIENT_CLINIC_OR_DEPARTMENT_OTHER): Payer: Medicare HMO

## 2022-07-31 ENCOUNTER — Emergency Department (HOSPITAL_BASED_OUTPATIENT_CLINIC_OR_DEPARTMENT_OTHER)
Admission: EM | Admit: 2022-07-31 | Discharge: 2022-07-31 | Disposition: A | Discharge status: Home / Self Care | Payer: Medicare HMO | Attending: Emergency Medicine | Admitting: Emergency Medicine

## 2022-07-31 ENCOUNTER — Encounter (HOSPITAL_BASED_OUTPATIENT_CLINIC_OR_DEPARTMENT_OTHER): Payer: Self-pay

## 2022-07-31 ENCOUNTER — Other Ambulatory Visit: Payer: Self-pay

## 2022-07-31 DIAGNOSIS — I110 Hypertensive heart disease with heart failure: Secondary | ICD-10-CM | POA: Insufficient documentation

## 2022-07-31 DIAGNOSIS — Y9389 Activity, other specified: Secondary | ICD-10-CM | POA: Insufficient documentation

## 2022-07-31 DIAGNOSIS — M7989 Other specified soft tissue disorders: Secondary | ICD-10-CM | POA: Diagnosis not present

## 2022-07-31 DIAGNOSIS — M7041 Prepatellar bursitis, right knee: Secondary | ICD-10-CM | POA: Insufficient documentation

## 2022-07-31 DIAGNOSIS — M25561 Pain in right knee: Secondary | ICD-10-CM | POA: Diagnosis not present

## 2022-07-31 DIAGNOSIS — I5020 Unspecified systolic (congestive) heart failure: Secondary | ICD-10-CM | POA: Insufficient documentation

## 2022-07-31 DIAGNOSIS — M79644 Pain in right finger(s): Secondary | ICD-10-CM | POA: Diagnosis not present

## 2022-07-31 MED ORDER — KETOROLAC TROMETHAMINE 60 MG/2ML IM SOLN
60.0000 mg | Freq: Once | INTRAMUSCULAR | Status: AC
  Administered 2022-07-31: 60 mg via INTRAMUSCULAR
  Filled 2022-07-31: qty 2

## 2022-07-31 MED ORDER — NAPROXEN 500 MG PO TABS
ORAL_TABLET | ORAL | 0 refills | Status: DC
  Filled 2022-08-04: qty 20, 10d supply, fill #0

## 2022-07-31 NOTE — ED Notes (Signed)
With Xray

## 2022-07-31 NOTE — ED Triage Notes (Signed)
Patient states he is having right knee pain that started on Saturday. He said today the pain is unbearable. He has use ice and elevation and nothing has helped.

## 2022-07-31 NOTE — ED Provider Notes (Signed)
[facility]Y [facility] Provider Note: Joseph Dell, MD, FACEP  CSN: [id number] MRN: [id number] ARRIVAL: 07/31/22 at 0448 ROOM: MH08/MH08   CHIEF COMPLAINT  Knee Pain (right)   HISTORY OF PRESENT ILLNESS  07/31/22 5:04 AM Joseph Ayala is a 57 y.o. male who is on chronic buprenorphine/naloxone for opioid use disorder.  He is here with pain in his right knee that began 3 days ago.  He denies injury.  He rates the pain as a 10 out of 10.  He has used ice and elevation which have not helped.  There is associated swelling of the knee as well.  He also thinks he may have a metal splinter in the end of his right index finger about the past 10 days.   Past Medical History:  Diagnosis Date   Acute HFmrEF (heart failure with mildly reduced ejection fraction) (HCC) 10/31/2020   Acute hypoxemic respiratory failure (HCC)    Acute respiratory failure with hypoxia (HCC) 08/28/2020   CAP (community acquired pneumonia) 08/28/2020   Degenerative disc disease, lumbar    Dental crown present    ETOH abuse    History of kidney stones    Hypertension    states under control with meds., has been on med. x 1 yr. - is currently out of med., to see PCP 02/06/2018   Marijuana use, continuous    MRSA pneumonia (HCC) 09/09/2020   Muscle atrophy 01/2018   Muscle weakness 01/2018   Necrotizing myopathy    Necrotizing pneumonia (HCC) 09/09/2020   Neuropathic pain    Opioid use disorder    PAF (paroxysmal atrial fibrillation) (HCC)    Pleural effusion    Sepsis (HCC) 08/28/2020   Thoracic aortic aneurysm (TAA) (HCC)    a. seen on CT 08/2020.    Past Surgical History:  Procedure Laterality Date   ENDOVENOUS ABLATION SAPHENOUS VEIN W/ LASER Right 12-29-2015   endovenous laser ablation right greater saphenous vein, stab phlebectomy > 20 incisions right leg, sclerotherapy right leg by Gretta Began MD     MINOR MUSCLE BIOPSY Right 02/11/2018   Procedure: RECTUS FEMORIS MUSCLE BIOPSY;  Surgeon: Darnell Level, MD;   Location: [facility];  Service: General;  Laterality: Right;   MUSCLE BIOPSY Right 02/11/2018   Procedure: DELTOID MUSCLE BIOPSY;  Surgeon: Darnell Level, MD;  Location: [facility];  Service: General;  Laterality: Right;   NASAL FRACTURE SURGERY     TRACHEOSTOMY TUBE PLACEMENT N/A 09/14/2020   Procedure: TRACHEOSTOMY;  Surgeon: Suzanna Obey, MD;  Location: [facility];  Service: ENT;  Laterality: N/A;    Family History  Problem Relation Age of Onset   Varicose Veins Brother    Drug abuse Brother     Social History   Tobacco Use   Smoking status: Never   Smokeless tobacco: Former  Building services engineer Use: Never used  Substance Use Topics   Alcohol use: Not Currently    Comment: occasionally   Drug use: No    Prior to Admission medications   Medication Sig Start Date End Date Taking? Authorizing Provider  naproxen (NAPROSYN) 500 MG tablet Take 1 tablet twice daily as needed for knee pain. 07/31/22  Yes Lexee Brashears, MD  Ascorbic Acid (VITAMIN C) 100 MG tablet Take 100 mg by mouth daily.    [provider]  Buprenorphine HCl-Naloxone HCl 8-2 MG FILM Place 1 Film under the tongue in the morning, at noon, and at bedtime. 07/10/22  [name], MD  chlorthalidone (HYGROTON) 25 MG tablet Take 1 tablet (25 mg total) by mouth daily. 03/14/22   Masters, Katie, DO  loratadine (CLARITIN) 10 MG tablet Take 1 tablet (10 mg total) by mouth daily. 05/02/22   Nyoka Lint, PA-C  metoprolol succinate (TOPROL-XL) 50 MG 24 hr tablet Take 1 tablet (50 mg total) by mouth daily. Take with or immediately following a meal. 07/15/22   [name], MD  Multiple Vitamin (MULTIVITAMIN WITH MINERALS) TABS tablet Take 1 tablet by mouth daily.    [provider]  sildenafil (VIAGRA) 25 MG tablet Take 1 tablet (25 mg total) by mouth as needed for erectile dysfunction. 06/20/22 06/20/23  [name], MD  testosterone cypionate (DEPOTESTOSTERONE  CYPIONATE) 200 MG/ML injection Inject 2 mLs (400 mg total) into the muscle every 14 (fourteen) days. 07/20/22   [name], MD  zinc gluconate 50 MG tablet Take 50 mg by mouth daily.    [provider]    Allergies Ivp dye [iodinated contrast media], Lyrica cr [pregabalin er], Vicodin [hydrocodone-acetaminophen], and Quinolones   REVIEW OF SYSTEMS  Negative except as noted here or in the History of Present Illness.   PHYSICAL EXAMINATION  Initial Vital Signs Blood pressure 122/86, pulse (!) 105, temperature 98.2 F (36.8 C), temperature source Oral, resp. rate 17, height 5\' 11"  (1.803 m), weight 90.7 kg, SpO2 95 %.  Examination General: Well-developed, well-nourished male in no acute distress; appearance consistent with age of record HENT: normocephalic; atraumatic Eyes: Normal appearance Neck: supple Heart: regular rate and rhythm Lungs: clear to auscultation bilaterally Abdomen: soft; nondistended; nontender; bowel sounds present Extremities: Tenderness and swelling of right prepatellar soft tissue Neurologic: Awake, alert and oriented; motor function intact in all extremities and symmetric; no facial droop Skin: Warm and dry Psychiatric: Normal mood and affect   RESULTS  Summary of this visit's results, reviewed and interpreted by myself:   EKG Interpretation  Date/Time:    Ventricular Rate:    PR Interval:    QRS Duration:   QT Interval:    QTC Calculation:   R Axis:     Text Interpretation:         Laboratory Studies: No results found for this or any previous visit (from the past 24 hour(s)). Imaging Studies: DG Finger Index Right  Result Date: 07/31/2022 CLINICAL DATA:  57 year old male with history of pain and swelling in the right finger. EXAM: RIGHT INDEX FINGER 2+V COMPARISON:  No priors. FINDINGS: There is no evidence of fracture or dislocation. There is no evidence of arthropathy or other focal bone abnormality. Soft tissues appear  swollen along the lateral aspect of the right second digit, most pronounced adjacent to the PIP joint. No retained radiopaque foreign body in the soft tissues. IMPRESSION: 1. Soft tissue swelling without retained radiopaque foreign body or acute osseous abnormality. Electronically Signed   By: Vinnie Langton M.D.   On: 07/31/2022 06:38   DG Knee Complete 4 Views Right  Result Date: 07/31/2022 CLINICAL DATA:  57 year old male with history of pain and swelling in the right knee for the past 3 days. Evaluate for foreign body. EXAM: RIGHT KNEE - COMPLETE 4+ VIEW COMPARISON:  No priors. FINDINGS: Five views of the right knee demonstrate no acute displaced fracture, subluxation or dislocation. Diffuse soft tissue swelling anterior to the patella is noted. IMPRESSION: 1. Diffuse soft tissue swelling anterior to the patella. No retained radiopaque foreign body in the soft tissues. No other  acute abnormality to account for this finding. Electronically Signed   By: Vinnie Langton M.D.   On: 07/31/2022 06:37    ED COURSE and MDM  Nursing notes, initial and subsequent vitals signs, including pulse oximetry, reviewed and interpreted by myself.  Vitals:   07/31/22 0459 07/31/22 0500  BP: (!) 109/91 122/86  Pulse: (!) 106 (!) 105  Resp: 17   Temp: 98.2 F (36.8 C)   TempSrc: Oral   SpO2: 95% 95%  Weight:  90.7 kg  Height:  5\' 11"  (1.803 m)   Medications  ketorolac (TORADOL) injection 60 mg (60 mg Intramuscular Given 07/31/22 0517)   No radiopaque foreign object seen on radiographs of right index finger.  No fracture seen on radiographs of knee.  Clinical presentation is consistent with a prepatellar bursitis.  We will treat with an NSAIDS (he has recovery from opioid abuse) and refer to sports medicine as he may benefit from steroid injection.  There is no erythema or significant warmth to suggest a septic bursitis.  We will also place him in a knee sleeve.  He declines crutches.  6:42 AM Radiographic  findings support clinical diagnosis of prepatellar bursitis.  No radiopaque foreign body seen.   PROCEDURES  Procedures   ED DIAGNOSES     ICD-10-CM   1. Prepatellar bursitis of right knee  M70.41          Sarabi Sockwell, Jenny Reichmann, MD 07/31/22 [phone]

## 2022-08-01 ENCOUNTER — Encounter: Payer: Self-pay | Admitting: Family Medicine

## 2022-08-01 ENCOUNTER — Other Ambulatory Visit (HOSPITAL_BASED_OUTPATIENT_CLINIC_OR_DEPARTMENT_OTHER): Payer: Self-pay

## 2022-08-01 ENCOUNTER — Ambulatory Visit: Payer: Self-pay

## 2022-08-01 ENCOUNTER — Ambulatory Visit: Payer: Medicare HMO | Admitting: Family Medicine

## 2022-08-01 VITALS — BP 150/100 | Ht 71.0 in | Wt 200.0 lb

## 2022-08-01 DIAGNOSIS — M7651 Patellar tendinitis, right knee: Secondary | ICD-10-CM | POA: Insufficient documentation

## 2022-08-01 DIAGNOSIS — M25561 Pain in right knee: Secondary | ICD-10-CM

## 2022-08-01 MED ORDER — PREDNISONE 20 MG PO TABS
ORAL_TABLET | ORAL | 0 refills | Status: AC
  Filled 2022-08-01: qty 18, 10d supply, fill #0

## 2022-08-01 NOTE — Patient Instructions (Signed)
Nice to meet you Please use ice as need  Please use the brace as needed  Please let me know if the pain gets worse or you develop a fever   Please send me a message in MyChart with any questions or updates.  Please see me back in 1-2 weeks.   --Dr. Raeford Razor

## 2022-08-01 NOTE — Progress Notes (Signed)
Joseph Ayala - 57 y.o. male MRN [id number]  Date of birth: May 16, 1966  SUBJECTIVE:  Including CC & ROS.  No chief complaint on file.   Joseph Ayala is a 57 y.o. male that is presenting with acute right knee pain.  The pain is anterior in nature.  He has pain with weightbearing and flexion of the knee.  Localized this area.  No history of trauma.  He does take testosterone injections..  Reviewed the emergency apartment note from 1/23 shows he was counseled supportive care. Independent review Independent review of the right knee x-ray from 1/23 shows anterior soft tissue swelling  Review of Systems See HPI   HISTORY: Past Medical, Surgical, Social, and Family History Reviewed & Updated per EMR.   Pertinent Historical Findings include:  Past Medical History:  Diagnosis Date   Acute HFmrEF (heart failure with mildly reduced ejection fraction) ([place]) 10/31/2020   Acute hypoxemic respiratory failure (HCC)    Acute respiratory failure with hypoxia ([place]) 08/28/2020   CAP (community acquired pneumonia) 08/28/2020   Degenerative disc disease, lumbar    Dental crown present    ETOH abuse    History of kidney stones    Hypertension    states under control with meds., has been on med. x 1 yr. - is currently out of med., to see PCP 02/06/2018   Marijuana use, continuous    MRSA pneumonia ([place]) 09/09/2020   Muscle atrophy 01/2018   Muscle weakness 01/2018   Necrotizing myopathy    Necrotizing pneumonia ([place]) 09/09/2020   Neuropathic pain    Opioid use disorder    PAF (paroxysmal atrial fibrillation) (HCC)    Pleural effusion    Sepsis ([place]) 08/28/2020   Thoracic aortic aneurysm (TAA) ([place])    a. seen on CT 08/2020.    Past Surgical History:  Procedure Laterality Date   ENDOVENOUS ABLATION SAPHENOUS VEIN W/ LASER Right 12-29-2015   endovenous laser ablation right greater saphenous vein, stab phlebectomy > 20 incisions right leg, sclerotherapy right leg by Curt Jews MD     MINOR MUSCLE  BIOPSY Right 02/11/2018   Procedure: RECTUS FEMORIS MUSCLE BIOPSY;  Surgeon: Armandina Gemma, MD;  Location: [place];  Service: General;  Laterality: Right;   MUSCLE BIOPSY Right 02/11/2018   Procedure: DELTOID MUSCLE BIOPSY;  Surgeon: Armandina Gemma, MD;  Location: [place];  Service: General;  Laterality: Right;   NASAL FRACTURE SURGERY     TRACHEOSTOMY TUBE PLACEMENT N/A 09/14/2020   Procedure: TRACHEOSTOMY;  Surgeon: Melissa Montane, MD;  [facility];  Service: ENT;  Laterality: N/A;     PHYSICAL EXAM:  VS: BP (!) 150/100   Ht 5\' 11"  (1.803 m)   Wt 200 lb (90.7 kg)   BMI 27.89 kg/m  Physical Exam Gen: NAD, alert, cooperative with exam, well-appearing MSK:  Neurovascularly intact    Limited ultrasound: Right knee pain:  No effusion the suprapatellar pouch. Normal-appearing quadricep tendon. Mild prepatellar bursitis appreciated. Significant hyperemia of the distal patellar tendon and infrapatellar bursitis Mild degenerative changes in the medial and lateral meniscus  Summary: Findings consistent with patellar tendinitis and infrapatellar bursitis.  Ultrasound and interpretation by Clearance Coots, MD    ASSESSMENT & PLAN:   Patellar tendinitis of right knee Acutely occurring.  Symptoms seem more consistent with a change in the patellar tendon as well as infrapatellar bursitis.  Possibly associated with a gouty origin as he does have testosterone replacement therapy.  Seems less likely  for infection at this time. -Counseled on home exercise therapy and supportive care. -Prednisone. -Could consider lab work or aspiration and injection.

## 2022-08-01 NOTE — Assessment & Plan Note (Signed)
Acutely occurring.  Symptoms seem more consistent with a change in the patellar tendon as well as infrapatellar bursitis.  Possibly associated with a gouty origin as he does have testosterone replacement therapy.  Seems less likely for infection at this time. -Counseled on home exercise therapy and supportive care. -Prednisone. -Could consider lab work or aspiration and injection.

## 2022-08-04 ENCOUNTER — Ambulatory Visit (HOSPITAL_COMMUNITY)
Admission: EM | Admit: 2022-08-04 | Discharge: 2022-08-04 | Disposition: A | Discharge status: Home / Self Care | Payer: Medicare HMO | Attending: Psychiatry | Admitting: Psychiatry

## 2022-08-04 ENCOUNTER — Other Ambulatory Visit (HOSPITAL_COMMUNITY): Payer: Self-pay

## 2022-08-04 DIAGNOSIS — F1110 Opioid abuse, uncomplicated: Secondary | ICD-10-CM | POA: Diagnosis not present

## 2022-08-04 DIAGNOSIS — F1090 Alcohol use, unspecified, uncomplicated: Secondary | ICD-10-CM | POA: Insufficient documentation

## 2022-08-04 DIAGNOSIS — I10 Essential (primary) hypertension: Secondary | ICD-10-CM | POA: Diagnosis not present

## 2022-08-04 DIAGNOSIS — Z79899 Other long term (current) drug therapy: Secondary | ICD-10-CM | POA: Insufficient documentation

## 2022-08-04 NOTE — ED Triage Notes (Signed)
Pt presents to [facility] voluntarily seeking resources for substance use treatment. Pt reports using heroin daily about 1 gram, last use was today about 1 gram. Pt denies SI/HI and AVH.

## 2022-08-04 NOTE — ED Provider Notes (Signed)
Behavioral Health Urgent Care Medical Screening Exam  Patient Name: Joseph Ayala MRN: [id number] Date of Evaluation: 08/04/22 Chief Complaint:  seeking resources for opiate use.  Diagnosis:  Final diagnoses:  Opioid abuse ([place])    History of Present illness: Joseph Ayala is a 57 y.o. male patient with a past psychiatric history significant for bipolar, and opioid use disorder who presents to the [facility] voluntary unaccompanied seeking resources for opiate use.   Patient seen and evaluated face-to-face by this provider, chart reviewed and case discussed with Dr. Dwyane Dee. On evaluation, patient is alert and oriented x 4. His thought process is logical and goal oriented. His speech is clear and coherent at a normal rate. His mood is euthymic and affect is congruent. He has fair eye contact. He is calm and cooperative. He does not appear to be in acute distress.   Patient states that he has been using heroin probably mixed with fentanyl and methamphetamines. He reports using heroin every day since November. He reports using a gram of heroin today via snorting, starting at 8:00 this morning and throughout the day. He states that he last used heroin about 1 hour ago. He denies withdrawal symptoms. He reports a history of opiate abuse since 2008. He denies intentionally using other illicit drugs. He reports occasional alcohol use and states that he last consumed alcohol on July 28, 2022. He reports last attending substance abuse treatment at [facility] last September. He states that while at [facility] in 2023 for detox he had to be transferred to [facility] because he was suicidal at the time. He reports 3 past substance abuse treatments at [facility] and 1 psychiatric hospitalization at [facility]. He currently denies SI/HI/AVH. There is no objective evidence that the patient is currently responding to  internal or external stimuli. He denies past suicide attempts. He denies outpatient psychiatry or therapy. He states that he is prescribed suboxone TID by his primary care physician. He resides with his wife who he states is an NP for [employer]. He states that he has one daughter age 74 years old who is also a Therapist, sports. 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 He report taking prescribed metoprolol, prednisone, vitamin C, vitamin B12, Tylenol, suboxone Testerone, human growth hormone and HCTZ.  Plan: I discussed with the patient the various levels of substance abuse treatment options which included facility based crisis for inpatient opiate detox, residential services, outpatient substance abuse services, and chemical dependency IOP programs. Patient states that he is interested in opiate detox but will first need to go home and pack a bag. He states that he also wants to go home and tell his wife were the drugs are hidden so she can remove the drugs from the home. When asked if he would like to call his wife prior to being discharged to notify her of the drugs hidden in their home, he states no and that he will tell her once he gets home. Patient advised to return back to the [facility] for an evaluation for opiate detox when he is ready to commit to treatment. Patient educated on opioid prevention and treatment options.   [facility] from 08/04/2022 in [facility] ED from 07/31/2022 in [facility] Emergency Department at [facility] ED from 05/02/2022 in [facility]  Care at [facility])  C-SSRS RISK CATEGORY No Risk No Risk No Risk       Psychiatric Specialty Exam  Presentation  General Appearance:Appropriate for Environment  Eye Contact:Fair  Speech:Clear and Coherent  Speech Volume:Normal  Handedness:No data recorded  Mood and Affect   Mood: Euthymic  Affect: Congruent   Thought Process  Thought Processes: Coherent; Goal Directed  Descriptions of Associations:Intact  Orientation:Full (Time, Place and Person)  Thought Content:Logical    Hallucinations:None  Ideas of Reference:None  Suicidal Thoughts:No  Homicidal Thoughts:No   Sensorium  Memory: Immediate Fair; Remote Fair; Recent Fair  Judgment: Intact  Insight: Fair   Materials engineer: Fair  Attention Span: Fair  Recall: [employer] of Knowledge: Fair  Language: Fair   Psychomotor Activity  Psychomotor Activity: Normal   Assets  Assets: Armed forces logistics/support/administrative officer; Desire for Improvement; Financial Resources/Insurance; Housing; Intimacy; Leisure Time; Physical Health; Transportation; Social Support   Sleep  Sleep: Poor  Number of hours: No data recorded  Physical Exam: Physical Exam HENT:     Nose: Nose normal.  Eyes:     Conjunctiva/sclera: Conjunctivae normal.  Cardiovascular:     Rate and Rhythm: Normal rate.  Musculoskeletal:        General: Normal range of motion.     Cervical back: Normal range of motion.     Comments: Right knee brace applied.   Neurological:     Mental Status: He is alert and oriented to person, place, and time.    Review of Systems  Constitutional: Negative.   HENT: Negative.    Eyes: Negative.   Respiratory: Negative.    Cardiovascular: Negative.   Gastrointestinal: Negative.   Genitourinary: Negative.   Musculoskeletal:  Positive for joint pain.       Right knee   Neurological: Negative.   Endo/Heme/Allergies: Negative.   Psychiatric/Behavioral:  Positive for substance abuse.    Blood pressure (!) 131/90, pulse 78, temperature 97.7 F (36.5 C), temperature source Oral, resp. rate 18, SpO2 95 %. There is no height or weight on file to calculate BMI.  Musculoskeletal: Strength & Muscle Tone: within normal limits Gait & Station: normal Patient leans:  N/A   [place] MSE Discharge Disposition for Follow up and Recommendations: Based on my evaluation the patient does not appear to have an emergency medical condition and can be discharged with resources and follow up care in outpatient services for Medication Management, Substance Abuse Intensive Outpatient Program, and Individual Therapy  Discharge recommendations:   Medications: Patient is to take medications as prescribed.  No medication changes were made during your visit today. The patient or patient's guardian is to contact a medical professional and/or outpatient provider to address any new side effects that develop. The patient or the patient's guardian should update outpatient providers of any new medications and/or medication changes.   Outpatient Follow up: Please review list of outpatient resources for psychiatry and counseling. Please follow up with your primary care provider for all medical related needs.   Substance use: Please see list of residential and outpatient substance abuse treatment options. You may return to the [facility] for an evaluation for detox as needed.  Therapy: We recommend that patient participate in individual therapy to address mental health concerns.   Safety:   The following safety precautions should be taken:   No sharp objects. This includes scissors, razors, scrapers, and putty knives.   Chemicals should be removed and locked up.  Medications should be removed and locked up.   Weapons should be removed and locked up. This includes firearms, knives and instruments that can be used to cause injury.   The patient should abstain from use of illicit substances/drugs and abuse of any medications.  If symptoms worsen or do not continue to improve or if the patient becomes actively suicidal or homicidal then it is recommended that the patient return to the closest hospital emergency department, the [facility], or call 911 for further evaluation and treatment. National Suicide Prevention Lifeline 1-800-SUICIDE or 641-076-2593.  About 988 988 offers 24/7 access to trained crisis counselors who can help people experiencing mental health-related distress. People can call or text 988 or chat 988lifeline.org for themselves or if they are worried about a loved one who may need crisis support.   Please contact one of the following facilities to start medication management and therapy services:   [facility] [address]  [place], [place] [address] [phone]   [facility]  [address] [place], [place] [address] [phone]  [facility]  [address]ella R[address] [place], [place] [address] [phone]  [facility]  [address]  [place], [place] [address] [phone]  [facility]  [address] [place], [place] [address] [phone]  [facility]  [address] [place] [address] Rival  [place], [place] [address] [phone]    Layla Barter, NP 08/04/2022, 6:12 PM

## 2022-08-04 NOTE — Discharge Instructions (Addendum)
Discharge recommendations:   Medications: Patient is to take medications as prescribed.  No medication changes were made during your visit today. The patient or patient's guardian is to contact a medical professional and/or outpatient provider to address any new side effects that develop. The patient or the patient's guardian should update outpatient providers of any new medications and/or medication changes.   Outpatient Follow up: Please review list of outpatient resources for psychiatry and counseling. Please follow up with your primary care provider for all medical related needs.   Substance use: Please see list of residential and outpatient substance abuse treatment options. You may return to the [facility] for an evaluation for detox as needed.  Therapy: We recommend that patient participate in individual therapy to address mental health concerns.   Safety:   The following safety precautions should be taken:   No sharp objects. This includes scissors, razors, scrapers, and putty knives.   Chemicals should be removed and locked up.   Medications should be removed and locked up.   Weapons should be removed and locked up. This includes firearms, knives and instruments that can be used to cause injury.   The patient should abstain from use of illicit substances/drugs and abuse of any medications.  If symptoms worsen or do not continue to improve or if the patient becomes actively suicidal or homicidal then it is recommended that the patient return to the closest hospital emergency department, the [facility], or call 911 for further evaluation and treatment. National Suicide Prevention Lifeline 1-800-SUICIDE or 417 182 0093.  About 988 988 offers 24/7 access to trained crisis counselors who can help people experiencing mental health-related distress. People can call or text 988 or chat 988lifeline.org for themselves or  if they are worried about a loved one who may need crisis support.   Please contact one of the following facilities to start medication management and therapy services:   [facility] [address]  [place], [place] [address] [phone]   [place]  [address] [place], [place] [address] [phone]  [place]  [address] [place], [place] [address] [phone]  [facility]  [address]  [place], [place] [address] [phone]  [facility]  [place], [place] [address] [phone]  [place]  [address],  [place], [place] [address] [phone]

## 2022-08-06 ENCOUNTER — Other Ambulatory Visit (HOSPITAL_COMMUNITY): Payer: Self-pay

## 2022-08-06 ENCOUNTER — Other Ambulatory Visit: Payer: Self-pay | Admitting: Student in an Organized Health Care Education/Training Program

## 2022-08-06 DIAGNOSIS — F1190 Opioid use, unspecified, uncomplicated: Secondary | ICD-10-CM

## 2022-08-07 ENCOUNTER — Other Ambulatory Visit (HOSPITAL_COMMUNITY): Payer: Self-pay

## 2022-08-07 MED ORDER — BUPRENORPHINE HCL-NALOXONE HCL 8-2 MG SL FILM
1.0000 | ORAL_FILM | Freq: Three times a day (TID) | SUBLINGUAL | 0 refills | Status: DC
  Filled 2022-08-07 – 2022-08-08 (×2): qty 90, 30d supply, fill #0

## 2022-08-08 ENCOUNTER — Other Ambulatory Visit (HOSPITAL_COMMUNITY): Payer: Self-pay

## 2022-08-08 ENCOUNTER — Encounter (HOSPITAL_COMMUNITY): Payer: Self-pay | Admitting: Licensed Clinical Social Worker

## 2022-08-08 ENCOUNTER — Telehealth (HOSPITAL_COMMUNITY): Payer: Self-pay | Admitting: Licensed Clinical Social Worker

## 2022-08-08 NOTE — Telephone Encounter (Signed)
The therapist attempts to reach [facility] by phone after receiving a message that he came by the [place] office to inquire about the [facility] IOP after having been seen at the [facility] on 08/04/22. The therapist is unable to leave a HIPAA-compliant voicemail as he has a voicemail that has not been setup yet so will send correspondence via mail or MyChart if he has it set-up.  Adam Phenix, [place], LCSW, [facility], [place] 08/08/2022

## 2022-08-10 ENCOUNTER — Encounter (HOSPITAL_COMMUNITY): Payer: Self-pay | Admitting: Licensed Clinical Social Worker

## 2022-08-10 ENCOUNTER — Telehealth (HOSPITAL_COMMUNITY): Payer: Self-pay | Admitting: Licensed Clinical Social Worker

## 2022-08-10 NOTE — Telephone Encounter (Signed)
The therapist attempts to return a call from [place]; however, he again receives a message that he has a voicemail that has not been setup yet so will send another correspondence via [place].  Adam Phenix, [place], LCSW, [facility], [place] 08/10/2022

## 2022-08-13 ENCOUNTER — Encounter: Payer: Self-pay | Admitting: Family Medicine

## 2022-08-13 ENCOUNTER — Ambulatory Visit: Payer: Medicare HMO | Admitting: Family Medicine

## 2022-08-13 VITALS — BP 120/84 | Ht 71.0 in | Wt 200.0 lb

## 2022-08-13 DIAGNOSIS — M7651 Patellar tendinitis, right knee: Secondary | ICD-10-CM

## 2022-08-13 NOTE — Progress Notes (Signed)
  Joseph Ayala - 57 y.o. male MRN [id number]  Date of birth: 05-Sep-1965  SUBJECTIVE:  Including CC & ROS.  No chief complaint on file.   Joseph Ayala is a 57 y.o. male that is following up for his right knee pain.  Has had complete resolution with the use of the prednisone.    Review of Systems See HPI   HISTORY: Past Medical, Surgical, Social, and Family History Reviewed & Updated per EMR.   Pertinent Historical Findings include:  Past Medical History:  Diagnosis Date   Acute HFmrEF (heart failure with mildly reduced ejection fraction) ([place]) 10/31/2020   Acute hypoxemic respiratory failure (HCC)    Acute respiratory failure with hypoxia ([place]) 08/28/2020   CAP (community acquired pneumonia) 08/28/2020   Degenerative disc disease, lumbar    Dental crown present    ETOH abuse    History of kidney stones    Hypertension    states under control with meds., has been on med. x 1 yr. - is currently out of med., to see PCP 02/06/2018   Marijuana use, continuous    MRSA pneumonia ([place]) 09/09/2020   Muscle atrophy 01/2018   Muscle weakness 01/2018   Necrotizing myopathy    Necrotizing pneumonia ([place]) 09/09/2020   Neuropathic pain    Opioid use disorder    PAF (paroxysmal atrial fibrillation) (HCC)    Pleural effusion    Sepsis ([place]) 08/28/2020   Thoracic aortic aneurysm (TAA) ([place])    a. seen on CT 08/2020.    Past Surgical History:  Procedure Laterality Date   ENDOVENOUS ABLATION SAPHENOUS VEIN W/ LASER Right 12-29-2015   endovenous laser ablation right greater saphenous vein, stab phlebectomy > 20 incisions right leg, sclerotherapy right leg by Curt Jews MD     MINOR MUSCLE BIOPSY Right 02/11/2018   Procedure: RECTUS FEMORIS MUSCLE BIOPSY;  Surgeon: Armandina Gemma, MD;  Location: [place] [facility];  Service: General;  Laterality: Right;   MUSCLE BIOPSY Right 02/11/2018   Procedure: DELTOID MUSCLE BIOPSY;  Surgeon: Armandina Gemma, MD;  Location: [facility];  Service:  General;  Laterality: Right;   NASAL FRACTURE SURGERY     TRACHEOSTOMY TUBE PLACEMENT N/A 09/14/2020   Procedure: TRACHEOSTOMY;  Surgeon: Melissa Montane, MD;  Location: [facility];  Service: ENT;  Laterality: N/A;     PHYSICAL EXAM:  VS: BP 120/84 (BP Location: Left Arm, Patient Position: Sitting)   Ht 5\' 11"  (1.803 m)   Wt 200 lb (90.7 kg)   BMI 27.89 kg/m  Physical Exam Gen: NAD, alert, cooperative with exam, well-appearing MSK:  Neurovascularly intact       ASSESSMENT & PLAN:   Patellar tendinitis of right knee Significant improvement with the prednisone.  Symptoms more consistent with inflammatory source -Counseled on home exercise therapy and supportive care. -ANA, sed rate, CRP, uric acid, CK

## 2022-08-13 NOTE — Patient Instructions (Signed)
Good to see you We'll call with the lab results from today   Please send me a message in MyChart with any questions or updates.  Please see me back as needed.   --Dr. Raeford Razor

## 2022-08-13 NOTE — Assessment & Plan Note (Signed)
Significant improvement with the prednisone.  Symptoms more consistent with inflammatory source -Counseled on home exercise therapy and supportive care. -ANA, sed rate, CRP, uric acid, CK

## 2022-08-15 ENCOUNTER — Telehealth: Payer: Self-pay | Admitting: Family Medicine

## 2022-08-15 LAB — ANA,IFA RA DIAG PNL W/RFLX TIT/PATN
ANA Titer 1: NEGATIVE
Cyclic Citrullin Peptide Ab: 7 units (ref 0–19)
Rheumatoid fact SerPl-aCnc: <10 IU/mL (ref <14.0)

## 2022-08-15 LAB — CK: Total CK: 876 U/L (ref 41–331)

## 2022-08-15 LAB — C-REACTIVE PROTEIN: CRP: <1 mg/L (ref 0–10)

## 2022-08-15 LAB — URIC ACID: Uric Acid: 6.9 mg/dL (ref 3.8–8.4)

## 2022-08-15 LAB — SEDIMENTATION RATE: Sed Rate: 2 mm/hr (ref 0–30)

## 2022-08-15 NOTE — Telephone Encounter (Signed)
Informed of results.   Rosemarie Ax, MD [facility] 08/15/2022, 11:01 AM

## 2022-08-16 ENCOUNTER — Other Ambulatory Visit: Payer: Self-pay

## 2022-08-17 ENCOUNTER — Other Ambulatory Visit: Payer: Self-pay | Admitting: Family Medicine

## 2022-08-17 ENCOUNTER — Other Ambulatory Visit (HOSPITAL_BASED_OUTPATIENT_CLINIC_OR_DEPARTMENT_OTHER): Payer: Self-pay

## 2022-08-17 ENCOUNTER — Telehealth: Payer: Self-pay | Admitting: *Deleted

## 2022-08-17 NOTE — Telephone Encounter (Signed)
-----   Message from Elberta Spaniel sent at 08/17/2022  8:55 AM EST ----- Regarding: Pt cld states knee swelling back up since 2/5 OV Patient cld to see if provider would send in Prednisone Rx --per patient his (R)knee is swelling & painful. Offered pt OV today he decline says has many appts today .  Previous Rx:   Rx #: [id number] predniSONE (DELTASONE) 20 MG tablet [[id number]]  ENDED  Order Details  Dose, Route, Frequency: As Directed Dispense Quantity: 18 tablet Refills: 0 Fills remaining: 0     Sig: Take 3 tablets (60 mg total) by mouth daily for 3 days, THEN 2 tablets (40 mg total) daily for 3 days, THEN 1 tablet (20 mg total) daily for 2 days, THEN 0.5 tablets (10 mg total) daily for 2 days.     ---[facility] [address], [place], [place] [address] Phone: [phone]  Fax: [phone]  Thx

## 2022-08-20 ENCOUNTER — Other Ambulatory Visit (HOSPITAL_COMMUNITY): Payer: Self-pay

## 2022-08-20 ENCOUNTER — Encounter: Payer: Self-pay | Admitting: *Deleted

## 2022-08-20 MED ORDER — COLCHICINE 0.6 MG PO TABS
0.6000 mg | ORAL_TABLET | Freq: Two times a day (BID) | ORAL | 2 refills | Status: DC
  Filled 2022-08-20: qty 60, 30d supply, fill #0

## 2022-08-20 NOTE — Telephone Encounter (Signed)
Called patient. No answer/no vm. Pt informed of below via MyChart.  Please inform we're going to try colchicine. Too many steroids can be harmful. He takes colchicine until 2 days of being pain free

## 2022-09-03 ENCOUNTER — Ambulatory Visit: Payer: Medicare HMO | Attending: Cardiology | Admitting: Student

## 2022-09-03 NOTE — Progress Notes (Deleted)
Patient ID: Joseph Ayala                 DOB: 02-26-1966                    MRN: [id number]      HPI: Joseph Ayala is a 57 y.o. male patient referred to lipid clinic by Dr.Schumann. PMH is significant for hx of hypertension, necrotizing myopathy, opioid use disorder on Suboxone, alcohol abuse, McArdle syndrome. Patient LDLc was elevated, Zetia was initiated LDLc improved from 163 mg/dl to 62 mg/dl.    Reviewed options for lowering LDL cholesterol, including ezetimibe, PCSK-9 inhibitors, bempedoic acid and inclisiran.  Discussed mechanisms of action, dosing, side effects and potential decreases in LDL cholesterol.  Also reviewed cost information and potential options for patient assistance.  Current Medications:  Intolerances:  Risk Factors: CAD LDL goal: <70 mg/dl   Diet:   Exercise:   Family History:   Social History:   Labs: Lipid Panel     Component Value Date/Time   CHOL 168 05/09/2022 1503   TRIG 292 (H) 05/09/2022 1503   HDL 61 05/09/2022 1503   CHOLHDL 2.8 05/09/2022 1503   LDLCALC 62 05/09/2022 1503   LABVLDL 45 (H) 05/09/2022 1503    Past Medical History:  Diagnosis Date   Acute HFmrEF (heart failure with mildly reduced ejection fraction) ([place]) 10/31/2020   Acute hypoxemic respiratory failure (HCC)    Acute respiratory failure with hypoxia ([place]) 08/28/2020   CAP (community acquired pneumonia) 08/28/2020   Degenerative disc disease, lumbar    Dental crown present    ETOH abuse    History of kidney stones    Hypertension    states under control with meds., has been on med. x 1 yr. - is currently out of med., to see PCP 02/06/2018   Marijuana use, continuous    MRSA pneumonia ([place]) 09/09/2020   Muscle atrophy 01/2018   Muscle weakness 01/2018   Necrotizing myopathy    Necrotizing pneumonia ([place]) 09/09/2020   Neuropathic pain    Opioid use disorder    PAF (paroxysmal atrial fibrillation) (HCC)    Pleural effusion    Sepsis ([place]) 08/28/2020   Thoracic aortic  aneurysm (TAA) ([place])    a. seen on CT 08/2020.    Current Outpatient Medications on File Prior to Visit  Medication Sig Dispense Refill   Ascorbic Acid (VITAMIN C) 100 MG tablet Take 100 mg by mouth daily.     Buprenorphine HCl-Naloxone HCl 8-2 MG FILM Place 1 Film under the tongue in the morning, at noon, and at bedtime. 90 each 0   chlorthalidone (HYGROTON) 25 MG tablet Take 1 tablet (25 mg total) by mouth daily. 90 tablet 3   colchicine 0.6 MG tablet Take 1 tablet (0.6 mg total) by mouth 2 (two) times daily. 60 tablet 2   loratadine (CLARITIN) 10 MG tablet Take 1 tablet (10 mg total) by mouth daily. 15 tablet 0   metoprolol succinate (TOPROL-XL) 50 MG 24 hr tablet Take 1 tablet (50 mg total) by mouth daily. Take with or immediately following a meal. 90 tablet 3   Multiple Vitamin (MULTIVITAMIN WITH MINERALS) TABS tablet Take 1 tablet by mouth daily.     naproxen (NAPROSYN) 500 MG tablet Take 1 tablet twice daily as needed for knee pain. 20 tablet 0   sildenafil (VIAGRA) 25 MG tablet Take 1 tablet (25 mg total) by mouth as needed for erectile dysfunction. 20 tablet 1  testosterone cypionate (DEPOTESTOSTERONE CYPIONATE) 200 MG/ML injection Inject 2 mLs (400 mg total) into the muscle every 14 (fourteen) days. 4 mL 1   zinc gluconate 50 MG tablet Take 50 mg by mouth daily.     No current facility-administered medications on file prior to visit.    Allergies  Allergen Reactions   Ivp Dye [Iodinated Contrast Media] Hives   Lyrica Cr [Pregabalin Er] Other (See Comments)    Memory loss   Vicodin [Hydrocodone-Acetaminophen] Nausea And Vomiting   Quinolones Other (See Comments)    Aortic Aneurysm    Assessment/Plan:  1. Hyperlipidemia -  No problems updated. No problem-specific Assessment & Plan notes found for this encounter.    Thank you,  Cammy Copa, Pharm.D [facility] [place] [address], [place], [place] [address]  Phone:  [phone]; Fax: [phone]

## 2022-09-06 ENCOUNTER — Other Ambulatory Visit: Payer: Self-pay | Admitting: Student in an Organized Health Care Education/Training Program

## 2022-09-06 ENCOUNTER — Other Ambulatory Visit (HOSPITAL_COMMUNITY): Payer: Self-pay

## 2022-09-06 DIAGNOSIS — F1190 Opioid use, unspecified, uncomplicated: Secondary | ICD-10-CM

## 2022-09-06 MED ORDER — BUPRENORPHINE HCL-NALOXONE HCL 8-2 MG SL FILM
1.0000 | ORAL_FILM | Freq: Three times a day (TID) | SUBLINGUAL | 0 refills | Status: DC
  Filled 2022-09-06: qty 90, 30d supply, fill #0

## 2022-09-12 ENCOUNTER — Other Ambulatory Visit (HOSPITAL_COMMUNITY): Payer: Self-pay

## 2022-09-12 ENCOUNTER — Other Ambulatory Visit: Payer: Self-pay | Admitting: Student in an Organized Health Care Education/Training Program

## 2022-09-12 MED ORDER — TESTOSTERONE CYPIONATE 200 MG/ML IM SOLN
400.0000 mg | INTRAMUSCULAR | 1 refills | Status: DC
  Filled 2022-09-12: qty 4, 28d supply, fill #0
  Filled 2022-10-04 (×2): qty 4, 28d supply, fill #1

## 2022-09-16 DIAGNOSIS — Z1152 Encounter for screening for COVID-19: Secondary | ICD-10-CM | POA: Diagnosis not present

## 2022-09-16 DIAGNOSIS — Z0000 Encounter for general adult medical examination without abnormal findings: Secondary | ICD-10-CM | POA: Diagnosis not present

## 2022-09-17 ENCOUNTER — Other Ambulatory Visit: Payer: Self-pay | Admitting: Surgery

## 2022-09-17 DIAGNOSIS — I7121 Aneurysm of the ascending aorta, without rupture: Secondary | ICD-10-CM

## 2022-09-17 DIAGNOSIS — Z0000 Encounter for general adult medical examination without abnormal findings: Secondary | ICD-10-CM | POA: Diagnosis not present

## 2022-09-17 DIAGNOSIS — F1910 Other psychoactive substance abuse, uncomplicated: Secondary | ICD-10-CM | POA: Diagnosis not present

## 2022-09-17 DIAGNOSIS — Z1152 Encounter for screening for COVID-19: Secondary | ICD-10-CM | POA: Diagnosis not present

## 2022-09-18 ENCOUNTER — Other Ambulatory Visit: Payer: Self-pay

## 2022-09-18 ENCOUNTER — Telehealth: Payer: Self-pay | Admitting: *Deleted

## 2022-09-18 ENCOUNTER — Other Ambulatory Visit (HOSPITAL_COMMUNITY): Payer: Self-pay

## 2022-09-18 MED ORDER — PREDNISONE 50 MG PO TABS
ORAL_TABLET | ORAL | 0 refills | Status: DC
  Filled 2022-09-18 – 2022-09-28 (×2): qty 3, 1d supply, fill #0

## 2022-09-18 MED ORDER — DIPHENHYDRAMINE HCL 50 MG PO TABS
ORAL_TABLET | ORAL | 0 refills | Status: DC
  Filled 2022-09-18: qty 1, fill #0

## 2022-09-18 NOTE — Telephone Encounter (Signed)
Received call from Alba with [facility] ([facility] re[facility]) in [place] - stated their doctor wanting to know if pt is taking Buprenorphine - the dosage, last rx written, pharmacy rx sent, and last appt - information given per chart.

## 2022-09-18 NOTE — Progress Notes (Signed)
Protocol initiated prior to patient's CT scan with Dr. Cyndia Bent 5/1 for follow-up of TAA. Patient made aware of prescriptions.

## 2022-09-18 NOTE — Telephone Encounter (Signed)
Thank you for telling them. I hope he is able to get some help at their facility. That is a positive development.

## 2022-09-19 ENCOUNTER — Other Ambulatory Visit (HOSPITAL_COMMUNITY): Payer: Self-pay

## 2022-09-19 ENCOUNTER — Encounter: Payer: Self-pay | Admitting: Student in an Organized Health Care Education/Training Program

## 2022-09-19 ENCOUNTER — Telehealth: Payer: Self-pay | Admitting: *Deleted

## 2022-09-19 NOTE — Telephone Encounter (Signed)
Please see patient's messages below. Letter faxed to [facility] at [phone]).

## 2022-09-19 NOTE — Telephone Encounter (Signed)
Please see today's patient message.

## 2022-09-19 NOTE — Telephone Encounter (Signed)
Thank you. I am not sure how to refer to "inpatient detox" as this referral is not in our system. I have written a letter with background and my support for inpatient OUD treatment. Can we fax that letter to the number he provided? Happy to do whatever will work best.

## 2022-09-19 NOTE — Telephone Encounter (Signed)
Patient called Front desk requesting a referral to in-patient rehab facility:  Fax number for the referral [facility]) [phone]

## 2022-09-20 ENCOUNTER — Telehealth: Payer: Self-pay | Admitting: Student in an Organized Health Care Education/Training Program

## 2022-09-20 DIAGNOSIS — F1994 Other psychoactive substance use, unspecified with psychoactive substance-induced mood disorder: Secondary | ICD-10-CM

## 2022-09-20 NOTE — Telephone Encounter (Signed)
Referral placed. I tried to call him to clarify if he is looking for only outpatient treatment at this point. No answer. We were previously trying to get inpatient treatment approved, but seems like something changed?

## 2022-09-20 NOTE — Telephone Encounter (Signed)
Pt called to request a [place] Referral. Pt states he just got out of a Detox Program and will need to get into [facility] as soon as possible.  Will this patient need an office visit 1st to discuss the need for this Referral request or can one be placed?  Per previous messages in Epic the patient has been called several times and has been sent a letter back on 08/10/2022 to sch an appointment with no success with [facility].

## 2022-09-21 ENCOUNTER — Other Ambulatory Visit (HOSPITAL_COMMUNITY): Payer: Self-pay

## 2022-09-25 ENCOUNTER — Other Ambulatory Visit (INDEPENDENT_AMBULATORY_CARE_PROVIDER_SITE_OTHER)
Admission: EM | Admit: 2022-09-25 | Discharge: 2022-09-27 | Disposition: A | Discharge status: Other Acute Inpatient Hospital | Payer: Medicare HMO | Source: Home / Self Care | Admitting: Registered Nurse

## 2022-09-25 ENCOUNTER — Encounter (HOSPITAL_COMMUNITY): Payer: Self-pay | Admitting: Registered Nurse

## 2022-09-25 DIAGNOSIS — Z79899 Other long term (current) drug therapy: Secondary | ICD-10-CM | POA: Diagnosis not present

## 2022-09-25 DIAGNOSIS — R112 Nausea with vomiting, unspecified: Secondary | ICD-10-CM | POA: Diagnosis present

## 2022-09-25 DIAGNOSIS — F1914 Other psychoactive substance abuse with psychoactive substance-induced mood disorder: Secondary | ICD-10-CM

## 2022-09-25 DIAGNOSIS — Z1152 Encounter for screening for COVID-19: Secondary | ICD-10-CM | POA: Insufficient documentation

## 2022-09-25 DIAGNOSIS — F1910 Other psychoactive substance abuse, uncomplicated: Secondary | ICD-10-CM | POA: Diagnosis present

## 2022-09-25 DIAGNOSIS — I10 Essential (primary) hypertension: Secondary | ICD-10-CM | POA: Diagnosis not present

## 2022-09-25 DIAGNOSIS — F1994 Other psychoactive substance use, unspecified with psychoactive substance-induced mood disorder: Secondary | ICD-10-CM | POA: Diagnosis present

## 2022-09-25 DIAGNOSIS — F1911 Other psychoactive substance abuse, in remission: Secondary | ICD-10-CM | POA: Insufficient documentation

## 2022-09-25 DIAGNOSIS — F1110 Opioid abuse, uncomplicated: Secondary | ICD-10-CM | POA: Diagnosis not present

## 2022-09-25 DIAGNOSIS — Z5329 Procedure and treatment not carried out because of patient's decision for other reasons: Secondary | ICD-10-CM | POA: Diagnosis not present

## 2022-09-25 DIAGNOSIS — Z79891 Long term (current) use of opiate analgesic: Secondary | ICD-10-CM | POA: Insufficient documentation

## 2022-09-25 DIAGNOSIS — D72829 Elevated white blood cell count, unspecified: Secondary | ICD-10-CM | POA: Diagnosis not present

## 2022-09-25 DIAGNOSIS — R7989 Other specified abnormal findings of blood chemistry: Secondary | ICD-10-CM | POA: Diagnosis not present

## 2022-09-25 LAB — URINALYSIS, ROUTINE W REFLEX MICROSCOPIC
Bilirubin Urine: NEGATIVE
Glucose, UA: NEGATIVE mg/dL
Hgb urine dipstick: NEGATIVE
Ketones, ur: NEGATIVE mg/dL
Leukocytes,Ua: NEGATIVE
Nitrite: NEGATIVE
Protein, ur: NEGATIVE mg/dL
Specific Gravity, Urine: 1.006 (ref 1.005–1.030)
pH: 7 (ref 5.0–8.0)

## 2022-09-25 LAB — POCT URINE DRUG SCREEN - MANUAL ENTRY (I-SCREEN)
POC Amphetamine UR: NOT DETECTED
POC Buprenorphine (BUP): NOT DETECTED
POC Cocaine UR: NOT DETECTED
POC Marijuana UR: POSITIVE — AB
POC Methadone UR: NOT DETECTED
POC Methamphetamine UR: NOT DETECTED
POC Morphine: NOT DETECTED
POC Oxazepam (BZO): NOT DETECTED
POC Oxycodone UR: NOT DETECTED
POC Secobarbital (BAR): NOT DETECTED

## 2022-09-25 LAB — POC SARS CORONAVIRUS 2 AG: SARSCOV2ONAVIRUS 2 AG: NEGATIVE

## 2022-09-25 LAB — CBC WITH DIFFERENTIAL/PLATELET
Abs Immature Granulocytes: 0.05 10*3/uL (ref 0.00–0.07)
Basophils Absolute: 0.1 10*3/uL (ref 0.0–0.1)
Basophils Relative: 1 %
Eosinophils Absolute: 0.4 10*3/uL (ref 0.0–0.5)
Eosinophils Relative: 6 %
HCT: 44.4 % (ref 39.0–52.0)
Hemoglobin: 15.3 g/dL (ref 13.0–17.0)
Immature Granulocytes: 1 %
Lymphocytes Relative: 25 %
Lymphs Abs: 1.7 10*3/uL (ref 0.7–4.0)
MCH: 30.4 pg (ref 26.0–34.0)
MCHC: 34.5 g/dL (ref 30.0–36.0)
MCV: 88.3 fL (ref 80.0–100.0)
Monocytes Absolute: 0.6 10*3/uL (ref 0.1–1.0)
Monocytes Relative: 8 %
Neutro Abs: 4.2 10*3/uL (ref 1.7–7.7)
Neutrophils Relative %: 59 %
Platelets: 213 10*3/uL (ref 150–400)
RBC: 5.03 MIL/uL (ref 4.22–5.81)
RDW: 13.1 % (ref 11.5–15.5)
WBC: 7 10*3/uL (ref 4.0–10.5)
nRBC: 0 % (ref 0.0–0.2)

## 2022-09-25 MED ORDER — CLONIDINE HCL 0.1 MG PO TABS
0.1000 mg | ORAL_TABLET | Freq: Four times a day (QID) | ORAL | Status: DC
  Administered 2022-09-25 – 2022-09-26 (×6): 0.1 mg via ORAL
  Filled 2022-09-25 (×6): qty 1

## 2022-09-25 MED ORDER — TESTOSTERONE CYPIONATE 200 MG/ML IM SOLN: 400.0000 mg | INTRAMUSCULAR | Status: DC

## 2022-09-25 MED ORDER — DICYCLOMINE HCL 20 MG PO TABS: 20.0000 mg | ORAL_TABLET | Freq: Four times a day (QID) | ORAL | Status: DC | PRN

## 2022-09-25 MED ORDER — CLONIDINE HCL 0.1 MG PO TABS: 0.1000 mg | ORAL_TABLET | ORAL | Status: DC

## 2022-09-25 MED ORDER — TRAZODONE HCL 100 MG PO TABS
ORAL_TABLET | ORAL | Status: AC
  Filled 2022-09-25: qty 1

## 2022-09-25 MED ORDER — MAGNESIUM HYDROXIDE 400 MG/5ML PO SUSP: 30.0000 mL | Freq: Every day | ORAL | Status: DC | PRN

## 2022-09-25 MED ORDER — VITAMIN C 500 MG PO TABS
250.0000 mg | ORAL_TABLET | Freq: Every day | ORAL | Status: DC
  Administered 2022-09-25 – 2022-09-26 (×2): 250 mg via ORAL
  Filled 2022-09-25: qty 1
  Filled 2022-09-25: qty 0.5
  Filled 2022-09-25: qty 1
  Filled 2022-09-25: qty 0.5

## 2022-09-25 MED ORDER — ADULT MULTIVITAMIN W/MINERALS CH
1.0000 | ORAL_TABLET | Freq: Every day | ORAL | Status: DC
  Administered 2022-09-25 – 2022-09-26 (×2): 1 via ORAL
  Filled 2022-09-25 (×2): qty 1

## 2022-09-25 MED ORDER — ACETAMINOPHEN 325 MG PO TABS
650.0000 mg | ORAL_TABLET | Freq: Four times a day (QID) | ORAL | Status: DC | PRN
  Administered 2022-09-26 – 2022-09-27 (×2): 650 mg via ORAL
  Filled 2022-09-25 (×3): qty 2

## 2022-09-25 MED ORDER — ALUM & MAG HYDROXIDE-SIMETH 200-200-20 MG/5ML PO SUSP: 30.0000 mL | ORAL | Status: DC | PRN

## 2022-09-25 MED ORDER — CLONIDINE HCL 0.1 MG PO TABS: 0.1000 mg | ORAL_TABLET | Freq: Every day | ORAL | Status: DC

## 2022-09-25 MED ORDER — HYDROXYZINE HCL 25 MG PO TABS
25.0000 mg | ORAL_TABLET | Freq: Four times a day (QID) | ORAL | Status: DC | PRN
  Administered 2022-09-25: 25 mg via ORAL
  Filled 2022-09-25: qty 1

## 2022-09-25 MED ORDER — NAPROXEN 500 MG PO TABS: 500.0000 mg | ORAL_TABLET | Freq: Two times a day (BID) | ORAL | Status: DC | PRN

## 2022-09-25 MED ORDER — METOPROLOL SUCCINATE ER 50 MG PO TB24
50.0000 mg | ORAL_TABLET | Freq: Every day | ORAL | Status: DC
  Administered 2022-09-25 – 2022-09-26 (×2): 50 mg via ORAL
  Filled 2022-09-25 (×2): qty 1

## 2022-09-25 MED ORDER — LOPERAMIDE HCL 2 MG PO CAPS: 2.0000 mg | ORAL_CAPSULE | ORAL | Status: DC | PRN

## 2022-09-25 MED ORDER — CHLORTHALIDONE 25 MG PO TABS
25.0000 mg | ORAL_TABLET | Freq: Every day | ORAL | Status: DC
  Administered 2022-09-26: 25 mg via ORAL
  Filled 2022-09-25 (×5): qty 1

## 2022-09-25 MED ORDER — ONDANSETRON 4 MG PO TBDP
4.0000 mg | ORAL_TABLET | Freq: Four times a day (QID) | ORAL | Status: DC | PRN
  Administered 2022-09-25 – 2022-09-27 (×2): 4 mg via ORAL
  Filled 2022-09-25 (×3): qty 1

## 2022-09-25 MED ORDER — METHOCARBAMOL 500 MG PO TABS: 500.0000 mg | ORAL_TABLET | Freq: Three times a day (TID) | ORAL | Status: DC | PRN

## 2022-09-25 MED ORDER — GABAPENTIN 300 MG PO CAPS
300.0000 mg | ORAL_CAPSULE | Freq: Three times a day (TID) | ORAL | Status: DC
  Administered 2022-09-25 – 2022-09-26 (×4): 300 mg via ORAL
  Filled 2022-09-25 (×4): qty 1

## 2022-09-25 MED ORDER — TRAZODONE HCL 100 MG PO TABS
100.0000 mg | ORAL_TABLET | Freq: Every evening | ORAL | Status: DC | PRN
  Administered 2022-09-25 – 2022-09-26 (×2): 100 mg via ORAL
  Filled 2022-09-25: qty 1

## 2022-09-25 NOTE — ED Notes (Signed)
Patient observed/assessed in room in bed appearing in no immediate distress resting peacefully. Q15 minute checks continued by MHT and nursing staff. Will continue to monitor and support. 

## 2022-09-25 NOTE — ED Provider Notes (Signed)
Facility Based Crisis Admission H&P  Date: 09/25/22 Patient Name: Joseph Ayala MRN: [id number] Chief Complaint: Addiction Problem (Requesting services for detox polysubstance and then transfer to long term rehab)   Diagnoses:  Final diagnoses:  Substance induced mood disorder ([place])  Polysubstance abuse ([place])   HPI: Joseph Ayala 57 y.o., male patient with history of polysubstance abuse (fentanyl, cocaine, marijuana, alcohol, meth, opiates, and CBD ) presented to [facility] as a walk-in accompanied by his wife Ginger Wrage with complaints of polysubstance abuse.  Requesting services for detox and then assistance to get into a long-term rehab facility.    Patient seen face to face by this provider, consulted with Dr. Hampton Abbot; and chart reviewed on 09/25/22.  On evaluation Joseph Ayala reports he has a chronic history of opiate use disorder and recently he has been using fentanyl daily.  Reports his last use of opiates was yesterday.  Also reports CBD several times during the week but has been over a week since his last use.,  Reports occasional alcohol use, also marijuana use, and meth use.  States it has been several weeks since he has used meth.  Patient reporting that he was at a rehab facility in [place] but had a bad reaction to a medication that was given to him and he was transferred to during hospital.  Reports he was never taken back to the rehab facility and drove himself home on Sunday 09/22/2021.  Patient reports he does not understand how he was never accepted back into the rehab program.  Reports today he got home he started using again.  Patient reports he is unable to quit on his own and has to go to a detox facility to assist with his withdrawal symptoms.  Patient reports his wife has spoken to admission staff at [facility] and they accept his insurance but insurance does not cover detox at their facility.  Patient wants to detox at [place] see and then go to [facility].  At this time  patient denies suicidal/self-harm/homicidal ideations, psychosis, paranoia.  Patient reporting a chronic history of polysubstance abuse.  Patient lives at home with his wife, unemployed but receives disability.  Patient states if he does not get help he is "Going to be divorced and homeless.."  Patient reporting that he has a muscle disease that causes him pain. During evaluation Joseph Ayala is sitting in chair beside wife with no noted distress.  He is alert/oriented x 4, calm, cooperative, and attentive.  His responses were appropriated to assessment questions.  His mood is anxious but euthymic and congruent with affect.  He spoke in a clear tone at moderate volume, and normal pace, with good eye contact.   He denies suicidal/self-harm/homicidal ideation, psychosis, and paranoia.  Objectively:  there is no evidence of psychosis/mania or delusional thinking.  He conversed coherently, with goal directed thoughts, and no distractibility, or pre-occupation.   Patient aware that he will not be prescribed pain medications.  He is also aware that he will not receive Suboxone while on [facility] unit.  His plans are to go to [facility] once completed detox and wants social work to start the process.    PHQ 2-9:  [place] ED from 09/25/2022 in [facility] Office Visit from 04/23/2022 in [place] Office Visit from 10/12/2020 in [place]  Thoughts that you would be better off dead, or of hurting yourself in some way More  than half the days Not at all Not at all  PHQ-9 Total Score 22 5 0       Flowsheet Row ED from 09/25/2022 in [facility] ED from 08/04/2022 in [facility] ED from 07/31/2022 in [facility] Emergency Department at [place] No Risk No Risk No Risk        Total Time spent with patient: 45 minutes  Musculoskeletal  Strength &  Muscle Tone: within normal limits Gait & Station: normal Patient leans: N/A  Psychiatric Specialty Exam  Presentation General Appearance:  Appropriate for Environment  Eye Contact: Good  Speech: Clear and Coherent; Normal Rate  Speech Volume: Normal  Handedness: Right   Mood and Affect  Mood: Anxious; Euthymic  Affect: Appropriate; Congruent   Thought Process  Thought Processes: Coherent; Goal Directed  Descriptions of Associations:Intact  Orientation:Full (Time, Place and Person)  Thought Content:Logical  Diagnosis of Schizophrenia or Schizoaffective disorder in past: No   Hallucinations:Hallucinations: None  Ideas of Reference:None  Suicidal Thoughts:Suicidal Thoughts: No  Homicidal Thoughts:Homicidal Thoughts: No  Sensorium  Memory: Immediate Good; Recent Good; Remote Good  Judgment: Intact  Insight: Present   Executive Functions  Concentration: Good  Attention Span: Good  Recall: Good  Fund of Knowledge: Good  Language: Good   Psychomotor Activity  Psychomotor Activity: Psychomotor Activity: Normal   Assets  Assets: Communication Skills; Desire for Improvement; Financial Resources/Insurance; Housing; Intimacy; Leisure Time; Social Support   Sleep  Sleep: Sleep: Fair   Nutritional Assessment (For OBS and [facility] admissions only) Has the patient had a weight loss or gain of 10 pounds or more in the last 3 months?: No Has the patient had a decrease in food intake/or appetite?: No Does the patient have dental problems?: No Does the patient have eating habits or behaviors that may be indicators of an eating disorder including binging or inducing vomiting?: No Has the patient recently lost weight without trying?: 0 Has the patient been eating poorly because of a decreased appetite?: 0 Malnutrition Screening Tool Score: 0    Physical Exam Vitals and nursing note reviewed.  Constitutional:      General: He is not in  acute distress.    Appearance: Normal appearance. He is not ill-appearing.  HENT:     Head: Normocephalic.  Eyes:     Pupils: Pupils are equal, round, and reactive to light.  Cardiovascular:     Rate and Rhythm: Normal rate.  Pulmonary:     Effort: Pulmonary effort is normal. No respiratory distress.  Musculoskeletal:        General: Normal range of motion.     Cervical back: Normal range of motion.  Skin:    General: Skin is warm and dry.  Neurological:     Mental Status: He is alert and oriented to person, place, and time.  Psychiatric:        Attention and Perception: Attention and perception normal. He does not perceive auditory or visual hallucinations.        Mood and Affect: Affect normal. Mood is anxious.        Speech: Speech normal.        Behavior: Behavior normal. Behavior is cooperative.        Thought Content: Thought content normal. Thought content is not paranoid or delusional. Thought content does not include homicidal or suicidal ideation.        Cognition and Memory: Cognition normal.  Judgment: Judgment normal.    Review of Systems  Musculoskeletal:  Positive for joint pain and myalgias.       History of McArdle syndrome, neuropathy  Psychiatric/Behavioral:  Depression: Stable. Substance abuse: Meth, cocaine, THC, Fentanyl, alcohol, CBD. Suicidal ideas: Denies.     Blood pressure 101/80, pulse 64, temperature 97.9 F (36.6 C), temperature source Oral, resp. rate 16, SpO2 97 %. There is no height or weight on file to calculate BMI.  Past Psychiatric History: Polysubstance abuse   Is the patient at risk to self? No  Has the patient been a risk to self in the past 6 months? No .    Has the patient been a risk to self within the distant past? No   Is the patient a risk to others? No   Has the patient been a risk to others in the past 6 months? No   Has the patient been a risk to others within the distant past? No   Past Medical History:  Past  Medical History:  Diagnosis Date   Acute HFmrEF (heart failure with mildly reduced ejection fraction) ([place]) 10/31/2020   Acute hypoxemic respiratory failure (HCC)    Acute respiratory failure with hypoxia ([place]) 08/28/2020   CAP (community acquired pneumonia) 08/28/2020   Degenerative disc disease, lumbar    Dental crown present    ETOH abuse    History of kidney stones    Hypertension    states under control with meds., has been on med. x 1 yr. - is currently out of med., to see PCP 02/06/2018   Marijuana use, continuous    MRSA pneumonia ([place]) 09/09/2020   Muscle atrophy 01/2018   Muscle weakness 01/2018   Necrotizing myopathy    Necrotizing pneumonia ([place]) 09/09/2020   Neuropathic pain    Opioid use disorder    PAF (paroxysmal atrial fibrillation) (HCC)    Pleural effusion    Sepsis ([place]) 08/28/2020   Thoracic aortic aneurysm (TAA) ([place])    a. seen on CT 08/2020.    Family History:  Family History  Problem Relation Age of Onset   Varicose Veins Brother    Drug abuse Brother     Social History:  Social History   Socioeconomic History   Marital status: Married    Spouse name: Not on file   Number of children: 1   Years of education: 13   Highest education level: Some college, no degree  Occupational History   Occupation: not working  Tobacco Use   Smoking status: Never   Smokeless tobacco: Former  Scientific laboratory technician Use: Never used  Substance and Sexual Activity   Alcohol use: Not Currently    Comment: occasionally   Drug use: No   Sexual activity: Yes    Partners: Female    Birth control/protection: None  Other Topics Concern   Not on file  Social History Narrative   Lives with wife and daughter in a one story home.  Has one child.  Currently not working.  Education: some college.    Social Determinants of Health   Financial Resource Strain: Low Risk  (03/19/2022)   Overall Financial Resource Strain (CARDIA)    Difficulty of Paying Living Expenses: Not hard at  all  Food Insecurity: No Food Insecurity (03/19/2022)   Hunger Vital Sign    Worried About Running Out of Food in the Last Year: Never true    Ran Out of Food in the Last Year: Never true  Transportation Needs: No Transportation Needs (03/27/2022)   PRAPARE - Hydrologist (Medical): No    Lack of Transportation (Non-Medical): No  Physical Activity: Inactive (03/19/2022)   Exercise Vital Sign    Days of Exercise per Week: 0 days    Minutes of Exercise per Session: 0 min  Stress: No Stress Concern Present (03/19/2022)   [place]    Feeling of Stress : Not at all  Social Connections: Moderately Isolated (03/19/2022)   Social Connection and Isolation Panel [NHANES]    Frequency of Communication with Friends and Family: Twice a week    Frequency of Social Gatherings with Friends and Family: Twice a week    Attends Religious Services: Never    Marine scientist or Organizations: No    Attends Archivist Meetings: Never    Marital Status: Married  Human resources officer Violence: Not At Risk (03/19/2022)   Humiliation, Afraid, Rape, and Kick questionnaire    Fear of Current or Ex-Partner: No    Emotionally Abused: No    Physically Abused: No    Sexually Abused: No    Last Labs:  Admission on 09/25/2022  Component Date Value Ref Range Status   SARSCOV2ONAVIRUS 2 AG 09/25/2022 NEGATIVE  NEGATIVE Final   Comment: (NOTE) SARS-CoV-2 antigen NOT DETECTED.   Negative results are presumptive.  Negative results do not preclude SARS-CoV-2 infection and should not be used as the sole basis for treatment or other patient management decisions, including infection  control decisions, particularly in the presence of clinical signs and  symptoms consistent with COVID-19, or in those who have been in contact with the virus.  Negative results must be combined with clinical observations, patient  history, and epidemiological information. The expected result is Negative.  Fact Sheet for Patients: HandmadeRecipes.com.cy  Fact Sheet for Healthcare Providers: FuneralLife.at  This test is not yet approved or cleared by the [place] FDA and  has been authorized for detection and/or diagnosis of SARS-CoV-2 by FDA under an Emergency Use Authorization (EUA).  This EUA will remain in effect (meaning this test can be used) for the duration of  the COV                          ID-19 declaration under Section 564(b)(1) of the Act, 21 U.S.C. section 360bbb-3(b)(1), unless the authorization is terminated or revoked sooner.    Office Visit on 08/13/2022  Component Date Value Ref Range Status   ANA Titer 1 08/13/2022 Negative   Final   Comment:                                      Negative   <1:80                                      Borderline  1:80                                      Positive   >1:80 ICAP nomenclature: AC-0 For more information about Hep-2 cell patterns use ANApatterns.org, the official website for the International Consensus on Antinuclear Antibody (ANA) Patterns (ICAP).  Rhuematoid fact SerPl-aCnc 08/13/2022 <10.0  <14.0 IU/mL Final   Cyclic Citrullin Peptide Ab 08/13/2022 7  0 - 19 units Final   Comment:                           Negative               <20                           Weak positive      20 - 39                           Moderate positive  40 - 59                           Strong positive        >59    Sed Rate 08/13/2022 2  0 - 30 mm/hr Final   CRP 08/13/2022 <1  0 - 10 mg/L Final   Uric Acid 08/13/2022 6.9  3.8 - 8.4 mg/dL Final              Therapeutic target for gout patients: <6.0   Total CK 08/13/2022 876 (HH)  41 - 331 U/L Final  Office Visit on 07/23/2022  Component Date Value Ref Range Status   Glucose 07/23/2022 97  70 - 99 mg/dL Final   BUN 07/23/2022 28 (H)  6 - 24 mg/dL Final    Creatinine, Ser 07/23/2022 0.72 (L)  0.76 - 1.27 mg/dL Final   eGFR 07/23/2022 109  >59 mL/min/1.73 Final   BUN/Creatinine Ratio 07/23/2022 39 (H)  9 - 20 Final   Sodium 07/23/2022 139  134 - 144 mmol/L Final   Potassium 07/23/2022 4.6  3.5 - 5.2 mmol/L Final   Chloride 07/23/2022 100  96 - 106 mmol/L Final   CO2 07/23/2022 27  20 - 29 mmol/L Final   Calcium 07/23/2022 10.3 (H)  8.7 - 10.2 mg/dL Final   Total Protein 07/23/2022 6.7  6.0 - 8.5 g/dL Final   Albumin 07/23/2022 4.8  3.8 - 4.9 g/dL Final   Globulin, Total 07/23/2022 1.9  1.5 - 4.5 g/dL Final   Albumin/Globulin Ratio 07/23/2022 2.5 (H)  1.2 - 2.2 Final   Bilirubin Total 07/23/2022 0.6  0.0 - 1.2 mg/dL Final   Alkaline Phosphatase 07/23/2022 77  44 - 121 IU/L Final   AST 07/23/2022 59 (H)  0 - 40 IU/L Final   ALT 07/23/2022 66 (H)  0 - 44 IU/L Final  Office Visit on 05/21/2022  Component Date Value Ref Range Status   Summary 05/21/2022 Note   Final   Comment: ==================================================================== ToxAssure Select,+Antidepr,UR ==================================================================== Test                             Result       Flag       Units  Drug Present and Declared for Prescription Verification   Buprenorphine                  29           EXPECTED   ng/mg creat   Norbuprenorphine               19  EXPECTED   ng/mg creat    Source of buprenorphine is a scheduled prescription medication.    Norbuprenorphine is an expected metabolite of buprenorphine.  Drug Present not Declared for Prescription Verification   Methamphetamine                >6250        UNEXPECTED ng/mg creat   Amphetamine                    9896         UNEXPECTED ng/mg creat    Sources of methamphetamine include illicit sources, as a scheduled    prescription medication, as a metabolite of some prescription drugs,    or use of an l-methamphetamine inhaler.     Amphetamine is an expected metabolite  of                           methamphetamine.    Amphetamine is also available as a schedule II prescription drug.    Carboxy-THC                    765          UNEXPECTED ng/mg creat    Carboxy-THC is a metabolite of tetrahydrocannabinol (THC). Source of    THC is most commonly herbal marijuana or marijuana-based products,    but THC is also present in a scheduled prescription medication.    Trace amounts of THC can be present in hemp and cannabidiol (CBD)    products. This test is not intended to distinguish between delta-9-    tetrahydrocannabinol, the predominant form of THC in most herbal or    marijuana-based products, and delta-8-tetrahydrocannabinol.    Morphine                       171          UNEXPECTED ng/mg creat    Potential sources of morphine include administration of codeine or    morphine, use of heroin, or ingestion of poppy seeds.    Fentanyl                       248          UNEXPECTED ng/mg creat   Norfentanyl                    >1250        UNEXPECTED ng/mg creat    Sour                          ce of fentanyl is a scheduled prescription medication, including    IV, patch, and transmucosal formulations. Norfentanyl is an expected    metabolite of fentanyl.  ==================================================================== Test                      Result    Flag   Units      Ref Range   Creatinine              80               mg/dL      >=20 ==================================================================== Declared Medications:  The flagging and interpretation on this report are based on the  following declared medications.  Unexpected results may arise from  inaccuracies in the declared  medications.   **Note: The testing scope of this panel does not include small to  moderate amounts of these reported medications:   Buprenorphine ==================================================================== For clinical consultation, please call [phone]. ====================================================================   Patient Message on 05/02/2022  Component Date Value Ref Range Status   Cholesterol, Total 05/09/2022 168  100 - 199 mg/dL Final   Triglycerides 05/09/2022 292 (H)  0 - 149 mg/dL Final   HDL 05/09/2022 61  >39 mg/dL Final   VLDL Cholesterol Cal 05/09/2022 45 (H)  5 - 40 mg/dL Final   LDL Chol Calc (NIH) 05/09/2022 62  0 - 99 mg/dL Final   Chol/HDL Ratio 05/09/2022 2.8  0.0 - 5.0 ratio Final   Comment:                                   T. Chol/HDL Ratio                                             Men  Women                               1/2 Avg.Risk  3.4    3.3                                   Avg.Risk  5.0    4.4                                2X Avg.Risk  9.6    7.1                                3X Avg.Risk 23.4   11.0   Office Visit on 04/23/2022  Component Date Value Ref Range Status   Summary 04/23/2022 Note   Final   Comment: ==================================================================== ToxAssure Select,+Antidepr,UR ==================================================================== Test                             Result       Flag       Units  Drug Absent but Declared for Prescription Verification   Buprenorphine                  Not Detected UNEXPECTED ng/mg creat    Low dose buprenorphine is not always detected even when used as    directed.  ==================================================================== Test                      Result    Flag   Units      Ref Range   Creatinine              158              mg/dL      >=20 ==================================================================== Declared Medications:  The flagging and interpretation on this report are based on the  following declared medications.  Unexpected results may arise from  inaccuracies in the declared medications.   **Note: The testing scope of  this panel does not include small to  moderate amount                           s of these reported medications:   Buprenorphine ==================================================================== For clinical consultation, please call [phone]. ====================================================================    Glucose 04/23/2022 90  70 - 99 mg/dL Final   BUN 04/23/2022 22  6 - 24 mg/dL Final   Creatinine, Ser 04/23/2022 0.66 (L)  0.76 - 1.27 mg/dL Final   eGFR 04/23/2022 111  >59 mL/min/1.73 Final   BUN/Creatinine Ratio 04/23/2022 33 (H)  9 - 20 Final   Sodium 04/23/2022 139  134 - 144 mmol/L Final   Potassium 04/23/2022 4.3  3.5 - 5.2 mmol/L Final   Chloride 04/23/2022 98  96 - 106 mmol/L Final   CO2 04/23/2022 25  20 - 29 mmol/L Final   Anion Gap 04/23/2022 16.0  10.0 - 18.0 mmol/L Final   Calcium 04/23/2022 10.1  8.7 - 10.2 mg/dL Final    Allergies: Ivp dye [iodinated contrast media], Lyrica cr [pregabalin er], Vicodin [hydrocodone-acetaminophen], and Quinolones  Medications:  Facility Ordered Medications  Medication   acetaminophen (TYLENOL) tablet 650 mg   alum & mag hydroxide-simeth (MAALOX/MYLANTA) 200-200-20 MG/5ML suspension 30 mL   magnesium hydroxide (MILK OF MAGNESIA) suspension 30 mL   dicyclomine (BENTYL) tablet 20 mg   hydrOXYzine (ATARAX) tablet 25 mg   loperamide (IMODIUM) capsule 2-4 mg   methocarbamol (ROBAXIN) tablet 500 mg   naproxen (NAPROSYN) tablet 500 mg   ondansetron (ZOFRAN-ODT) disintegrating tablet 4 mg   cloNIDine (CATAPRES) tablet 0.1 mg   Followed by   Derrill Memo ON 09/28/2022] cloNIDine (CATAPRES) tablet 0.1 mg   Followed by   Derrill Memo ON 09/30/2022] cloNIDine (CATAPRES) tablet 0.1 mg   [START ON 09/27/2022] testosterone cypionate (DEPOTESTOSTERONE CYPIONATE) injection 400 mg   metoprolol succinate (TOPROL-XL) 24 hr tablet 50 mg   chlorthalidone (HYGROTON) tablet 25 mg   multivitamin with minerals tablet 1 tablet   ascorbic acid (VITAMIN C) tablet 250 mg   gabapentin (NEURONTIN) capsule 300  mg   PTA Medications  Medication Sig   chlorthalidone (HYGROTON) 25 MG tablet Take 1 tablet (25 mg total) by mouth daily.   Multiple Vitamin (MULTIVITAMIN WITH MINERALS) TABS tablet Take 1 tablet by mouth daily.   Ascorbic Acid (VITAMIN C) 100 MG tablet Take 100 mg by mouth daily.   zinc gluconate 50 MG tablet Take 50 mg by mouth daily.   loratadine (CLARITIN) 10 MG tablet Take 1 tablet (10 mg total) by mouth daily.   sildenafil (VIAGRA) 25 MG tablet Take 1 tablet (25 mg total) by mouth as needed for erectile dysfunction.   metoprolol succinate (TOPROL-XL) 50 MG 24 hr tablet Take 1 tablet (50 mg total) by mouth daily. Take with or immediately following a meal.   naproxen (NAPROSYN) 500 MG tablet Take 1 tablet twice daily as needed for knee pain.   colchicine 0.6 MG tablet Take 1 tablet (0.6 mg total) by mouth 2 (two) times daily.   Buprenorphine HCl-Naloxone HCl 8-2 MG FILM Place 1 Film under the tongue in the morning, at noon, and at bedtime.   testosterone cypionate (DEPOTESTOSTERONE CYPIONATE) 200 MG/ML injection Inject 2 mLs (400 mg total) into the muscle every 14 (fourteen) days.   predniSONE (DELTASONE) 50 MG tablet Take 1 tablet by mouth 13 hours, 7 hours, and 1 hour before CT scan for contrast allergy.   diphenhydrAMINE (BENADRYL) 50  MG tablet Please take one tablet by mouth 1 hour before CT scan for contrast allergy.    Long Term Goals: Improvement in symptoms so as ready for discharge  Short Term Goals: Patient will verbalize feelings in meetings with treatment team members., Patient will attend at least of 50% of the groups daily., Pt will complete the PHQ9 on admission, day 3 and discharge., Patient will participate in completing the [place], Patient will score a low risk of violence for 24 hours prior to discharge, and Patient will take medications as prescribed daily.  Medical Decision Making  Trayveon T Glaus was admitted to [facility] crisis unit under the service of Hampton Abbot, MD for Substance induced mood disorder [facility]), crisis management, and stabilization. Routine labs ordered, which include Lab Orders         Resp panel by RT-PCR (RSV, Flu A&B, Covid) Anterior Nasal Swab         CBC with Differential/Platelet         Comprehensive metabolic panel         Hemoglobin A1c         Magnesium         Ethanol         Lipid panel         TSH         Prolactin         Urinalysis, Routine w reflex microscopic -Urine, Clean Catch         POCT Urine Drug Screen - (I-Screen)         POC SARS Coronavirus 2 Ag    Medication Management: Medications started Meds ordered this encounter  Medications   acetaminophen (TYLENOL) tablet 650 mg   alum & mag hydroxide-simeth (MAALOX/MYLANTA) 200-200-20 MG/5ML suspension 30 mL   magnesium hydroxide (MILK OF MAGNESIA) suspension 30 mL   dicyclomine (BENTYL) tablet 20 mg   hydrOXYzine (ATARAX) tablet 25 mg   loperamide (IMODIUM) capsule 2-4 mg   methocarbamol (ROBAXIN) tablet 500 mg   naproxen (NAPROSYN) tablet 500 mg   ondansetron (ZOFRAN-ODT) disintegrating tablet 4 mg   FOLLOWED BY Linked Order Group    cloNIDine (CATAPRES) tablet 0.1 mg    cloNIDine (CATAPRES) tablet 0.1 mg    cloNIDine (CATAPRES) tablet 0.1 mg   testosterone cypionate (DEPOTESTOSTERONE CYPIONATE) injection 400 mg   metoprolol succinate (TOPROL-XL) 24 hr tablet 50 mg   chlorthalidone (HYGROTON) tablet 25 mg   multivitamin with minerals tablet 1 tablet   ascorbic acid (VITAMIN C) tablet 250 mg   gabapentin (NEURONTIN) capsule 300 mg   Will maintain observation checks every 15 minutes for safety. Psychosocial education regarding relapse prevention and self-care; social and communication  Social work will consult with family for collateral information and discuss discharge and follow up plan.  Recommendations  Based on my evaluation the patient does not appear to  have an emergency medical condition.  Zephaniah Enyeart, NP 09/25/22  6:54 PM

## 2022-09-25 NOTE — Discharge Instructions (Addendum)
     [facility] Website:  [email or web address] Address:  [address] [place], [place] [address] Phone:  [phone]    CORE PROGRAMS Medication-Assisted Treatment > Our goal is to help you achieve and maintain a substance abuse-free life. We know this can be especially challenging for you, especially if you battle an addiction to opioids (heroin, oxycodone, hydrocodone, etc.). [facility] is an accredited facility allowed to provide medications that can help you medically and emotionally, so you can stop misusing prescription or illegal drugs. Adolescent Programs > Women's Recovery Center > Treatment with Housing (BATS) > We have special programs for adolescents and women to address their specific needs and provide a safe place for health and recovery. We also have programs that can help with housing and transportation.   Residential Treatment Programs > Residential treatment allows you to focus solely on your substance misuse and mental health recovery without distraction from some of life's stressors. By living on campus, you can be monitored and supported by staff 24 hours a day. [facility] residential treatment programs can be found at the [employer]. [facility] in [place], [place], within the beautiful campus of the [place]. [facility] is not a locked facility. All clients are at [facility] by choice.  OUTPATIENT TREATMENT Outpatient Treatment Programs > Periodic group, individual and family counseling services provide support to persons seeking recovery. Our counselors are trained in providing substance abuse-specific treatment utilizing several evidence-based approaches including motivational interviewing, cognitive behavioral therapy, trauma therapy and others. Substance Abuse Intensive Outpatient Treatment > Individual and group activities are the focal point of this program. We work on assisting you to begin recovery and learn  the skills you will need to stay on your new healthy path. You will meet three days a week, individually and with a group, as we focus on recovery stabilization. This program is designed to provide intensive services and allow you to stay at home and not miss work as you begin your recovery journey. Substance Abuse Comprehensive Outpatient Treatment > This program allows for structure and support, so you can achieve and maintain your path to wellness and recovery. We emphasize reducing or eliminating your harmful use of substances, stabilizing mental illness symptoms, and we help you develop a healthy social network. [facility] counselors can also help address lifestyle changes and help you focus on developing educational or work skills and improving your family situation if these are goals you would like to set. You will meet up to five days a week, including individual and group counseling. This program is designed to help people who have recently been discharged from a hospital or crisis center, or to prevent hospitalization due to substance misuse issues or severe symptoms of mental illness.    We give priority admission if you are pregnant, an intravenous drug user, are at risk of HIV/AIDS or tuberculosis (TB), or have opioid management treatment needs.

## 2022-09-25 NOTE — Progress Notes (Signed)
   09/25/22 1450  [place] (Walk-ins at [facility] only)  How Did You Hear About [place]? Self  What Is the Reason for Your Visit/Call Today? URGENT: Pt presents to [facility] voluntarily seeking resources for substance use treatment. Pt reports using heroin/fentanyl daily about 1/2 gram, last use was "Sunday or Monday" about 1 gram. He uses methamphetamine but doesn't know his average amount of use and last use was 1 week ago. He uses CBD, amount varies, 2-3 days per week, and last use was 5-6 days ago. Also, patient has a hx of alcohol use and last use was "a couple of weeks ago".  He reports withdrawal symptoms: nausea and body aches. No hx of seizures or DT's. No current SI, HI, and/or AVH's. However, states that he does experience SI, intermittently, and last reported suicidal thoughts were last Saturday. No hx of suicide attempts/gestures/self injurious behaviors. Patient has a hx of detox treatment at [facility]. (3x's). Also, tried to seek treatment at [facility] in [place], [place], recently. However, his spouse says the admission to the facility did not work out. No therapist/psychiatrist. Admitted to [facility] August 2023 for a psychiatric admission. Previously, prescribed medications for Bipolar Disorder, however; says he has never been diagnosed with Bipolar Disorder. Lives with spouse.  How Long Has This Been Causing You Problems? > than 6 months  Have You Recently Had Any Thoughts About Hurting Yourself? No  Are You Planning to Commit Suicide/Harm Yourself At This time? No  Have you Recently Had Thoughts About [place]? No  Are You Planning To Harm Someone At This Time? No  Are you currently experiencing any auditory, visual or other hallucinations? No  Have You Used Any Alcohol or Drugs in the Past 24 Hours? Yes  How long ago did you use Drugs or Alcohol? "This past Sunday or Monday"  What Did You Use and How Much? Pt presents to [facility] voluntarily  seeking resources for substance use treatment. Pt reports using heroin/fentanyl daily about 1/2 gram, last use was "Sunday or Monday" about 1 gram. He uses methamphetamine but doesn't know his average amount of use and last use was 1 week ago. He uses CBD, amount varies, 2-3 days per week, and last use was 5-6 days ago. Also, patient has a hx of alcohol use and last use was "a couple of weeks ago".  Do you have any current medical co-morbidities that require immediate attention? No  Clinician description of patient physical appearance/behavior: Currently calm and cooperative.  What Do You Feel Would Help You the Most Today? Alcohol or Drug Use Treatment;Medication(s)  If access to [facility] was not available, would you have sought care in the Emergency Department? No  Determination of Need Urgent (48 hours)  Options For Referral Medication Management;Facility-Based Crisis;Outpatient Therapy
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
## 2022-09-25 NOTE — ED Notes (Signed)
DASH courier called to collect STAT specimens collected via 1 stick to the L AC, tolerated well.

## 2022-09-26 ENCOUNTER — Other Ambulatory Visit (HOSPITAL_COMMUNITY): Payer: Self-pay

## 2022-09-26 DIAGNOSIS — F1994 Other psychoactive substance use, unspecified with psychoactive substance-induced mood disorder: Secondary | ICD-10-CM | POA: Diagnosis not present

## 2022-09-26 LAB — COMPREHENSIVE METABOLIC PANEL
ALT: 66 U/L — ABNORMAL HIGH (ref 0–44)
AST: 46 U/L — ABNORMAL HIGH (ref 15–41)
Albumin: 4.2 g/dL (ref 3.5–5.0)
Alkaline Phosphatase: 69 U/L (ref 38–126)
Anion gap: 12 (ref 5–15)
BUN: 18 mg/dL (ref 6–20)
CO2: 28 mmol/L (ref 22–32)
Calcium: 10.1 mg/dL (ref 8.9–10.3)
Chloride: 97 mmol/L — ABNORMAL LOW (ref 98–111)
Creatinine, Ser: 0.57 mg/dL — ABNORMAL LOW (ref 0.61–1.24)
GFR, Estimated: >60 mL/min (ref >60)
Glucose, Bld: 69 mg/dL — ABNORMAL LOW (ref 70–99)
Potassium: 3.9 mmol/L (ref 3.5–5.1)
Sodium: 137 mmol/L (ref 135–145)
Total Bilirubin: 0.2 mg/dL — ABNORMAL LOW (ref 0.3–1.2)
Total Protein: 6.7 g/dL (ref 6.5–8.1)

## 2022-09-26 LAB — ETHANOL: Alcohol, Ethyl (B): <10 mg/dL (ref <10)

## 2022-09-26 LAB — RESP PANEL BY RT-PCR (RSV, FLU A&B, COVID)  RVPGX2
Influenza A by PCR: NEGATIVE
Influenza B by PCR: NEGATIVE
Resp Syncytial Virus by PCR: NEGATIVE
SARS Coronavirus 2 by RT PCR: NEGATIVE

## 2022-09-26 LAB — LIPID PANEL
Cholesterol: 212 mg/dL — ABNORMAL HIGH (ref 0–200)
HDL: 45 mg/dL (ref >40)
LDL Cholesterol: 92 mg/dL (ref 0–99)
Total CHOL/HDL Ratio: 4.7 RATIO
Triglycerides: 373 mg/dL — ABNORMAL HIGH (ref <150)
VLDL: 75 mg/dL — ABNORMAL HIGH (ref 0–40)

## 2022-09-26 LAB — HEMOGLOBIN A1C
Hgb A1c MFr Bld: 5.6 % (ref 4.8–5.6)
Mean Plasma Glucose: 114 mg/dL

## 2022-09-26 LAB — MAGNESIUM: Magnesium: 2 mg/dL (ref 1.7–2.4)

## 2022-09-26 LAB — TSH: TSH: 0.602 u[IU]/mL (ref 0.350–4.500)

## 2022-09-26 MED ORDER — LORAZEPAM 1 MG PO TABS
1.0000 mg | ORAL_TABLET | ORAL | Status: AC | PRN
  Administered 2022-09-26: 1 mg via ORAL
  Filled 2022-09-26: qty 1

## 2022-09-26 MED ORDER — OLANZAPINE 10 MG PO TBDP
10.0000 mg | ORAL_TABLET | Freq: Three times a day (TID) | ORAL | Status: DC | PRN
  Administered 2022-09-26: 10 mg via ORAL
  Filled 2022-09-26: qty 1

## 2022-09-26 MED ORDER — OLANZAPINE 10 MG PO TABS: 10.0000 mg | ORAL_TABLET | Freq: Four times a day (QID) | ORAL | Status: DC | PRN

## 2022-09-26 MED ORDER — ZIPRASIDONE MESYLATE 20 MG IM SOLR
20.0000 mg | INTRAMUSCULAR | Status: AC | PRN
  Administered 2022-09-26: 20 mg via INTRAMUSCULAR
  Filled 2022-09-26: qty 20

## 2022-09-26 MED ORDER — OLANZAPINE 10 MG IM SOLR: 10.0000 mg | Freq: Four times a day (QID) | INTRAMUSCULAR | Status: DC | PRN

## 2022-09-26 NOTE — ED Notes (Signed)
Patient is sleeping. Respirations equal and unlabored, skin warm and dry, NAD. No change in assessment or acuity. Routine safety checks conducted according to facility protocol. Will continue to monitor for safety.   

## 2022-09-26 NOTE — ED Notes (Signed)
Pt refused AA 

## 2022-09-26 NOTE — ED Notes (Signed)
Patient is agitated towards nursing staff and is loudly expressing his inability to sleep and arguing with nursing staff. He has progressively continued to escalate and become loud on the unit, disturbing other patients attempting to rest. He just again returned to his room speaking loudly and acting agitated expressing that he needs help sleeping, inquiring about following up with a provider, and closed the door loudly on the unit.

## 2022-09-26 NOTE — Tx Team (Signed)
66 T [place] 57 year old male patient with history of polysubstance abuse (fentanyl, cocaine, marijuana, alcohol, meth, opiates, and CBD ) presented to [facility] as a walk-in accompanied by his wife Ginger Weissinger with complaints of polysubstance abuse.  Requesting services for detox and then assistance to get into a long-term rehab facility.    LCSW spoke with patient on this morning and the following information was confirmed from admission: Pt reports he has been using heroin/fentanyl daily about 1/2 gram, last use was "Sunday or Monday" about 1 gram. He uses methamphetamine but doesn't know his average amount of use and last use was 1 week ago. He uses CBD, amount varies, 2-3 days per week, and last use was 5-6 days ago. Patient has a history of alcohol use and last use was "a couple of weeks ago". He reports withdrawal symptoms: nausea and body aches. No history of seizures or DT's. No current SI, HI, and/or AVH's. However, states that he does experience SI, intermittently, and last reported suicidal thoughts were last Saturday. No hx of suicide attempts/gestures/self injurious behaviors. Patient has a hx of detox treatment at [facility]. (3x's). Also, tried to seek treatment at [facility] in [place], [place], recently. However, his spouse says the admission to the facility did not work out. No therapist/psychiatrist. Admitted to [facility] August 2023 for a psychiatric admission. Previously, prescribed medications for Bipolar Disorder, however; says he has never been diagnosed with Bipolar Disorder. Patient lives at home with his wife, unemployed but receives $1300 of disability.  Patient states if he does not get help, he is "going to be divorced and homeless". Per chart, His plans are to go to [facility] once completed detox and wants social work to start the process.   LCSW contacted [facility] to verify bed availability. Per Elmo Putt, referral would need to be sent and reviewed. LCSW was  informed by RN that patient's wife is requesting a follow up call regarding plan of care. LCSW was provided permission by patient to speak with wife regarding plan.   LCSW contacted patient's wife Ginger Oldenburg [phone] to discuss disposition plans. Per wife, she has been in contact with [facility] regarding possible placement at their agency. Wife requested for a referral to be sent to their agency for review. LCSW also informed wife that clinicals can be sent out to other facilities that may be in network with his Medicare plan and wife is agreeable to plan. Wife aware that LCSW will follow up with updates once received. Wife expressed appreciation for LCSW assistance. No other needs were reported at this time.   LCSW followed up with the following facilities:   [facility] in [place], [place]: Wife reports patient was denied due pulmonary nodule but advised to follow up again. LCSW followed up and patient has been denied per Amy in Admissions. Patient would need to be seen at his lung nodule appt on October 11, 2022 before they can consider for admission.  [facility], [place]: Per Dawayne Cirri, send referral for review.  [facility]: Per Amy, send referral for review.  [facility]: Referral sent; will follow up for updates [facility]: not contracted with Medicare [facility] in [place]: not contracted with Medicare [facility] in [place]: not in network with Medicare [place], [place] [facility] Ph: [phone]

## 2022-09-26 NOTE — ED Provider Notes (Signed)
Writer was notified by RN that patient was not feeling well.  Writer and [facility] went to see patient however patient is asleep and snoring.  Patient does have as needed Zofran on board.  Nurse to notify writer if anything changes.

## 2022-09-26 NOTE — ED Notes (Signed)
Patient irritable this morning. Accepted PO scheduled meds w/o difficulty. Out of bed to day room for breakfast. Currently resting in assigned room. Safety maintained and will continue to monitor.

## 2022-09-26 NOTE — ED Notes (Signed)
Patient has been emotional and irritated that he cannot fall asleep. Patient is constantly walking out of his room to the nurses station to complain. Patient continually makes loud noises disturbing other patients on the hall. Patient has been provided with medication from the agitation protocol.

## 2022-09-26 NOTE — ED Notes (Signed)
Patient refused to attend group this afternoon.

## 2022-09-26 NOTE — ED Provider Notes (Signed)
Behavioral Health Progress Note  Date and Time: 09/26/2022 3:06 PM Name: Joseph Ayala MRN:  [id number]  Subjective:   The patient is a 57 year old male with a history of polysubstance abuse.  The patient presented to the [facility] behavioral urgent care accompanied by his wife requesting help with his fentanyl use.  He reported rare use of alcohol and methamphetamine and no other substances.  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  Past psychiatric outpatient notes listed diagnosis of bipolar affective disorder, but I am unable to see any details.  The patient was admitted to the facility based crisis.  He reported a plan to go to [place].  Overnight the patient became agitated yelling about being unable to sleep.  He was given first oral Zyprexa, and then later Ativan and IM Geodon when he continued to be agitated.  On assessment 3/20, the patient is irritable and appears annoyed with this provider.  He blames his behavior last night on fentanyl withdrawal.  Discussed with him that fentanyl withdrawal does not cause the behavior that is described by the nursing staff.  He does not appear to be in any acute distress and appears somewhat comfortable in bed.  The patient verbally agreed that he will not behave like he did last night.  The patient states that he is receiving his home medications and does not need any of his other medications restarted.  Discussed with him that we will work on residential rehab placement.  The patient denies auditory/visual hallucinations.  The patient reports good mood, appetite, and sleep. They deny suicidal and homicidal thoughts. The patient denies side effects from their medications.  Review of systems as below. The patient denies experiencing any withdrawal symptoms.      Diagnosis:  Final  diagnoses:  Substance induced mood disorder ([place])  Polysubstance abuse ([place])   Total Time spent with patient: 20 minutes  Past Psychiatric History: as above Past Medical History: as above Family History: none Family Psychiatric  History: none Social History: as above and per H and P  Additional Social History:  See H and P                  Sleep: Fair  Appetite:  Fair  Current Medications:  Current Facility-Administered Medications  Medication Dose Route Frequency Provider Last Rate Last Admin   acetaminophen (TYLENOL) tablet 650 mg  650 mg Oral Q6H PRN Rankin, Shuvon B, NP   650 mg at 09/26/22 0259   alum & mag hydroxide-simeth (MAALOX/MYLANTA) 200-200-20 MG/5ML suspension 30 mL  30 mL Oral Q4H PRN Rankin, Shuvon B, NP       ascorbic acid (VITAMIN C) tablet 250 mg  250 mg Oral Daily Rankin, Shuvon B, NP   250 mg at 09/26/22 0910   chlorthalidone (HYGROTON) tablet 25 mg  25 mg Oral Daily Hampton Abbot, MD   25 mg at 09/26/22 0910   cloNIDine (CATAPRES) tablet 0.1 mg  0.1 mg Oral QID Rankin, Shuvon B, NP   0.1 mg at 09/26/22 1341   Followed by   Derrill Memo ON 09/28/2022] cloNIDine (CATAPRES) tablet 0.1 mg  0.1 mg Oral BH-qamhs Rankin, Shuvon B, NP       Followed by   Derrill Memo ON 09/30/2022] cloNIDine (CATAPRES) tablet 0.1 mg  0.1 mg Oral QAC breakfast Rankin, Shuvon B, NP  dicyclomine (BENTYL) tablet 20 mg  20 mg Oral Q6H PRN Rankin, Shuvon B, NP       gabapentin (NEURONTIN) capsule 300 mg  300 mg Oral TID Rankin, Shuvon B, NP   300 mg at 09/26/22 0910   hydrOXYzine (ATARAX) tablet 25 mg  25 mg Oral Q6H PRN Rankin, Shuvon B, NP   25 mg at 09/25/22 1958   loperamide (IMODIUM) capsule 2-4 mg  2-4 mg Oral PRN Rankin, Shuvon B, NP       magnesium hydroxide (MILK OF MAGNESIA) suspension 30 mL  30 mL Oral Daily PRN Rankin, Shuvon B, NP       methocarbamol (ROBAXIN) tablet 500 mg  500 mg Oral Q8H PRN Rankin, Shuvon B, NP       metoprolol succinate (TOPROL-XL) 24 hr tablet 50 mg  50  mg Oral Daily Rankin, Shuvon B, NP   50 mg at 09/26/22 0910   multivitamin with minerals tablet 1 tablet  1 tablet Oral Daily Rankin, Shuvon B, NP   1 tablet at 09/26/22 0910   naproxen (NAPROSYN) tablet 500 mg  500 mg Oral BID PRN Rankin, Shuvon B, NP       OLANZapine (ZYPREXA) tablet 10 mg  10 mg Oral Q6H PRN Corky Sox, MD       Or   OLANZapine (ZYPREXA) injection 10 mg  10 mg Intramuscular Q6H PRN Corky Sox, MD       ondansetron (ZOFRAN-ODT) disintegrating tablet 4 mg  4 mg Oral Q6H PRN Rankin, Shuvon B, NP   4 mg at 09/25/22 1956   [START ON 09/27/2022] testosterone cypionate (DEPOTESTOSTERONE CYPIONATE) injection 400 mg  400 mg Intramuscular Q14 Days Rankin, Shuvon B, NP       traZODone (DESYREL) tablet 100 mg  100 mg Oral QHS PRN Evette Georges, NP   100 mg at 09/25/22 2222   Current Outpatient Medications  Medication Sig Dispense Refill   Buprenorphine HCl-Naloxone HCl 8-2 MG FILM Place 1 Film under the tongue in the morning, at noon, and at bedtime. (Patient taking differently: Place 1 Film under the tongue 3 (three) times daily.) 90 each 0   chlorthalidone (HYGROTON) 25 MG tablet Take 1 tablet (25 mg total) by mouth daily. 90 tablet 3   loratadine (CLARITIN) 10 MG tablet Take 1 tablet (10 mg total) by mouth daily. 15 tablet 0   metoprolol succinate (TOPROL-XL) 50 MG 24 hr tablet Take 1 tablet (50 mg total) by mouth daily. Take with or immediately following a meal. 90 tablet 3   sildenafil (VIAGRA) 25 MG tablet Take 1 tablet (25 mg total) by mouth as needed for erectile dysfunction. 20 tablet 1   testosterone cypionate (DEPOTESTOSTERONE CYPIONATE) 200 MG/ML injection Inject 2 mLs (400 mg total) into the muscle every 14 (fourteen) days. 4 mL 1    Labs  Lab Results:  Admission on 09/25/2022  Component Date Value Ref Range Status   SARS Coronavirus 2 by RT PCR 09/25/2022 NEGATIVE  NEGATIVE Final   Influenza A by PCR 09/25/2022 NEGATIVE  NEGATIVE Final   Influenza B by PCR  09/25/2022 NEGATIVE  NEGATIVE Final   Comment: (NOTE) The Xpert Xpress SARS-CoV-2/FLU/RSV plus assay is intended as an aid in the diagnosis of influenza from Nasopharyngeal swab specimens and should not be used as a sole basis for treatment. Nasal washings and aspirates are unacceptable for Xpert Xpress SARS-CoV-2/FLU/RSV testing.  Fact Sheet for Patients: EntrepreneurPulse.com.au  Fact Sheet for Healthcare Providers: IncredibleEmployment.be  This test is  not yet approved or cleared by the [place] and has been authorized for detection and/or diagnosis of SARS-CoV-2 by FDA under an Emergency Use Authorization (EUA). This EUA will remain in effect (meaning this test can be used) for the duration of the COVID-19 declaration under Section 564(b)(1) of the Act, 21 U.S.C. section 360bbb-3(b)(1), unless the authorization is terminated or revoked.     Resp Syncytial Virus by PCR 09/25/2022 NEGATIVE  NEGATIVE Final   Comment: (NOTE) Fact Sheet for Patients: EntrepreneurPulse.com.au  Fact Sheet for Healthcare Providers: IncredibleEmployment.be  This test is not yet approved or cleared by the [place] FDA and has been authorized for detection and/or diagnosis of SARS-CoV-2 by FDA under an Emergency Use Authorization (EUA). This EUA will remain in effect (meaning this test can be used) for the duration of the COVID-19 declaration under Section 564(b)(1) of the Act, 21 U.S.C. section 360bbb-3(b)(1), unless the authorization is terminated or revoked.  Performed at [facility], [place] [address], [place], [place] [address]    WBC 09/25/2022 7.0  4.0 - 10.5 K/uL Final   RBC 09/25/2022 5.03  4.22 - 5.81 MIL/uL Final   Hemoglobin 09/25/2022 15.3  13.0 - 17.0 g/dL Final   HCT 09/25/2022 44.4  39.0 - 52.0 % Final   MCV 09/25/2022 88.3  80.0 - 100.0 fL Final   MCH 09/25/2022 30.4  26.0 - 34.0 pg Final    MCHC 09/25/2022 34.5  30.0 - 36.0 g/dL Final   RDW 09/25/2022 13.1  11.5 - 15.5 % Final   Platelets 09/25/2022 213  150 - 400 K/uL Final   nRBC 09/25/2022 0.0  0.0 - 0.2 % Final   Neutrophils Relative % 09/25/2022 59  % Final   Neutro Abs 09/25/2022 4.2  1.7 - 7.7 K/uL Final   Lymphocytes Relative 09/25/2022 25  % Final   Lymphs Abs 09/25/2022 1.7  0.7 - 4.0 K/uL Final   Monocytes Relative 09/25/2022 8  % Final   Monocytes Absolute 09/25/2022 0.6  0.1 - 1.0 K/uL Final   Eosinophils Relative 09/25/2022 6  % Final   Eosinophils Absolute 09/25/2022 0.4  0.0 - 0.5 K/uL Final   Basophils Relative 09/25/2022 1  % Final   Basophils Absolute 09/25/2022 0.1  0.0 - 0.1 K/uL Final   Immature Granulocytes 09/25/2022 1  % Final   Abs Immature Granulocytes 09/25/2022 0.05  0.00 - 0.07 K/uL Final   Performed at [facility], [place] [address], [place], [place] [address]   Sodium 09/25/2022 137  135 - 145 mmol/L Final   Potassium 09/25/2022 3.9  3.5 - 5.1 mmol/L Final   Chloride 09/25/2022 97 (L)  98 - 111 mmol/L Final   CO2 09/25/2022 28  22 - 32 mmol/L Final   Glucose, Bld 09/25/2022 69 (L)  70 - 99 mg/dL Final   Glucose reference range applies only to samples taken after fasting for at least 8 hours.   BUN 09/25/2022 18  6 - 20 mg/dL Final   Creatinine, Ser 09/25/2022 0.57 (L)  0.61 - 1.24 mg/dL Final   Calcium 09/25/2022 10.1  8.9 - 10.3 mg/dL Final   Total Protein 09/25/2022 6.7  6.5 - 8.1 g/dL Final   Albumin 09/25/2022 4.2  3.5 - 5.0 g/dL Final   AST 09/25/2022 46 (H)  15 - 41 U/L Final   ALT 09/25/2022 66 (H)  0 - 44 U/L Final   Alkaline Phosphatase 09/25/2022 69  38 - 126 U/L Final  Total Bilirubin 09/25/2022 0.2 (L)  0.3 - 1.2 mg/dL Final   GFR, Estimated 09/25/2022 >60  >60 mL/min Final   Comment: (NOTE) Calculated using the CKD-EPI Creatinine Equation (2021)    Anion gap 09/25/2022 12  5 - 15 Final   Performed at [facility] [address], [place], [place] [address]    Magnesium 09/25/2022 2.0  1.7 - 2.4 mg/dL Final   Performed at [facility] [address] [place], [place] [address]   Alcohol, Ethyl (B) 09/25/2022 <10  <10 mg/dL Final   Comment: (NOTE) Lowest detectable limit for serum alcohol is 10 mg/dL.  For medical purposes only. Performed at [facility], [place] [address], [place], [place] [address]    Cholesterol 09/25/2022 212 (H)  0 - 200 mg/dL Final   Triglycerides 09/25/2022 373 (H)  <150 mg/dL Final   HDL 09/25/2022 45  >40 mg/dL Final   Total CHOL/HDL Ratio 09/25/2022 4.7  RATIO Final   VLDL 09/25/2022 75 (H)  0 - 40 mg/dL Final   LDL Cholesterol 09/25/2022 92  0 - 99 mg/dL Final   Comment:        Total Cholesterol/HDL:CHD Risk Coronary Heart Disease Risk Table                     Men   Women  1/2 Average Risk   3.4   3.3  Average Risk       5.0   4.4  2 X Average Risk   9.6   7.1  3 X Average Risk  23.4   11.0        Use the calculated Patient Ratio above and the CHD Risk Table to determine the patient's CHD Risk.        ATP III CLASSIFICATION (LDL):  <100     mg/dL   Optimal  100-129  mg/dL   Near or Above                    Optimal  130-159  mg/dL   Borderline  160-189  mg/dL   High  >190     mg/dL   Very High Performed at [place] [address], [place], [place] [address]    TSH 09/25/2022 0.602  0.350 - 4.500 uIU/mL Final   Comment: Performed by a 3rd Generation assay with a functional sensitivity of <=0.01 uIU/mL. Performed at [facility], [place] [address], [place], [place] [address]    Color, Urine 09/25/2022 STRAW (A)  YELLOW Final   APPearance 09/25/2022 CLEAR  CLEAR Final   Specific Gravity, Urine 09/25/2022 1.006  1.005 - 1.030 Final   pH 09/25/2022 7.0  5.0 - 8.0 Final   Glucose, UA 09/25/2022 NEGATIVE  NEGATIVE mg/dL Final   Hgb urine dipstick 09/25/2022 NEGATIVE  NEGATIVE Final   Bilirubin Urine 09/25/2022 NEGATIVE  NEGATIVE Final   Ketones, ur 09/25/2022 NEGATIVE  NEGATIVE  mg/dL Final   Protein, ur 09/25/2022 NEGATIVE  NEGATIVE mg/dL Final   Nitrite 09/25/2022 NEGATIVE  NEGATIVE Final   Leukocytes,Ua 09/25/2022 NEGATIVE  NEGATIVE Final   Performed at [facility], [place] [address] [place], [place] [address]   POC Amphetamine UR 09/25/2022 None Detected  NONE DETECTED (Cut Off Level 1000 ng/mL) Final   POC Secobarbital (BAR) 09/25/2022 None Detected  NONE DETECTED (Cut Off Level 300 ng/mL) Final   POC Buprenorphine (BUP) 09/25/2022 None Detected  NONE DETECTED (  Cut Off Level 10 ng/mL) Final   POC Oxazepam (BZO) 09/25/2022 None Detected  NONE DETECTED (Cut Off Level 300 ng/mL) Final   POC Cocaine UR 09/25/2022 None Detected  NONE DETECTED (Cut Off Level 300 ng/mL) Final   POC Methamphetamine UR 09/25/2022 None Detected  NONE DETECTED (Cut Off Level 1000 ng/mL) Final   POC Morphine 09/25/2022 None Detected  NONE DETECTED (Cut Off Level 300 ng/mL) Final   POC Methadone UR 09/25/2022 None Detected  NONE DETECTED (Cut Off Level 300 ng/mL) Final   POC Oxycodone UR 09/25/2022 None Detected  NONE DETECTED (Cut Off Level 100 ng/mL) Final   POC Marijuana UR 09/25/2022 Positive (A)  NONE DETECTED (Cut Off Level 50 ng/mL) Final   SARSCOV2ONAVIRUS 2 AG 09/25/2022 NEGATIVE  NEGATIVE Final   Comment: (NOTE) SARS-CoV-2 antigen NOT DETECTED.   Negative results are presumptive.  Negative results do not preclude SARS-CoV-2 infection and should not be used as the sole basis for treatment or other patient management decisions, including infection  control decisions, particularly in the presence of clinical signs and  symptoms consistent with COVID-19, or in those who have been in contact with the virus.  Negative results must be combined with clinical observations, patient history, and epidemiological information. The expected result is Negative.  Fact Sheet for Patients: HandmadeRecipes.com.cy  Fact Sheet for Healthcare  Providers: FuneralLife.at  This test is not yet approved or cleared by the [place] FDA and  has been authorized for detection and/or diagnosis of SARS-CoV-2 by FDA under an Emergency Use Authorization (EUA).  This EUA will remain in effect (meaning this test can be used) for the duration of  the COV                          ID-19 declaration under Section 564(b)(1) of the Act, 21 U.S.C. section 360bbb-3(b)(1), unless the authorization is terminated or revoked sooner.    Office Visit on 08/13/2022  Component Date Value Ref Range Status   ANA Titer 1 08/13/2022 Negative   Final   Comment:                                      Negative   <1:80                                      Borderline  1:80                                      Positive   >1:80 ICAP nomenclature: AC-0 For more information about Hep-2 cell patterns use ANApatterns.org, the official website for the International Consensus on Antinuclear Antibody (ANA) Patterns (ICAP).    Rhuematoid fact SerPl-aCnc 08/13/2022 <10.0  <14.0 IU/mL Final   Cyclic Citrullin Peptide Ab 08/13/2022 7  0 - 19 units Final   Comment:                           Negative               <20  Weak positive      20 - 39                           Moderate positive  40 - 59                           Strong positive        >59    Sed Rate 08/13/2022 2  0 - 30 mm/hr Final   CRP 08/13/2022 <1  0 - 10 mg/L Final   Uric Acid 08/13/2022 6.9  3.8 - 8.4 mg/dL Final              Therapeutic target for gout patients: <6.0   Total CK 08/13/2022 876 (HH)  41 - 331 U/L Final  Office Visit on 07/23/2022  Component Date Value Ref Range Status   Glucose 07/23/2022 97  70 - 99 mg/dL Final   BUN 07/23/2022 28 (H)  6 - 24 mg/dL Final   Creatinine, Ser 07/23/2022 0.72 (L)  0.76 - 1.27 mg/dL Final   eGFR 07/23/2022 109  >59 mL/min/1.73 Final   BUN/Creatinine Ratio 07/23/2022 39 (H)  9 - 20 Final   Sodium  07/23/2022 139  134 - 144 mmol/L Final   Potassium 07/23/2022 4.6  3.5 - 5.2 mmol/L Final   Chloride 07/23/2022 100  96 - 106 mmol/L Final   CO2 07/23/2022 27  20 - 29 mmol/L Final   Calcium 07/23/2022 10.3 (H)  8.7 - 10.2 mg/dL Final   Total Protein 07/23/2022 6.7  6.0 - 8.5 g/dL Final   Albumin 07/23/2022 4.8  3.8 - 4.9 g/dL Final   Globulin, Total 07/23/2022 1.9  1.5 - 4.5 g/dL Final   Albumin/Globulin Ratio 07/23/2022 2.5 (H)  1.2 - 2.2 Final   Bilirubin Total 07/23/2022 0.6  0.0 - 1.2 mg/dL Final   Alkaline Phosphatase 07/23/2022 77  44 - 121 IU/L Final   AST 07/23/2022 59 (H)  0 - 40 IU/L Final   ALT 07/23/2022 66 (H)  0 - 44 IU/L Final  Office Visit on 05/21/2022  Component Date Value Ref Range Status   Summary 05/21/2022 Note   Final   Comment: ==================================================================== ToxAssure Select,+Antidepr,UR ==================================================================== Test                             Result       Flag       Units  Drug Present and Declared for Prescription Verification   Buprenorphine                  29           EXPECTED   ng/mg creat   Norbuprenorphine               19           EXPECTED   ng/mg creat    Source of buprenorphine is a scheduled prescription medication.    Norbuprenorphine is an expected metabolite of buprenorphine.  Drug Present not Declared for Prescription Verification   Methamphetamine                >6250        UNEXPECTED ng/mg creat   Amphetamine                    9896  UNEXPECTED ng/mg creat    Sources of methamphetamine include illicit sources, as a scheduled    prescription medication, as a metabolite of some prescription drugs,    or use of an l-methamphetamine inhaler.     Amphetamine is an expected metabolite of                           methamphetamine.    Amphetamine is also available as a schedule II prescription drug.    Carboxy-THC                    765           UNEXPECTED ng/mg creat    Carboxy-THC is a metabolite of tetrahydrocannabinol (THC). Source of    THC is most commonly herbal marijuana or marijuana-based products,    but THC is also present in a scheduled prescription medication.    Trace amounts of THC can be present in hemp and cannabidiol (CBD)    products. This test is not intended to distinguish between delta-9-    tetrahydrocannabinol, the predominant form of THC in most herbal or    marijuana-based products, and delta-8-tetrahydrocannabinol.    Morphine                       171          UNEXPECTED ng/mg creat    Potential sources of morphine include administration of codeine or    morphine, use of heroin, or ingestion of poppy seeds.    Fentanyl                       248          UNEXPECTED ng/mg creat   Norfentanyl                    >1250        UNEXPECTED ng/mg creat    Sour                          ce of fentanyl is a scheduled prescription medication, including    IV, patch, and transmucosal formulations. Norfentanyl is an expected    metabolite of fentanyl.  ==================================================================== Test                      Result    Flag   Units      Ref Range   Creatinine              80               mg/dL      >=20 ==================================================================== Declared Medications:  The flagging and interpretation on this report are based on the  following declared medications.  Unexpected results may arise from  inaccuracies in the declared medications.   **Note: The testing scope of this panel does not include small to  moderate amounts of these reported medications:   Buprenorphine ==================================================================== For clinical consultation, please call [phone]. ====================================================================   Patient Message on 05/02/2022  Component Date Value Ref Range Status   Cholesterol,  Total 05/09/2022 168  100 - 199 mg/dL Final   Triglycerides 05/09/2022 292 (H)  0 - 149 mg/dL Final   HDL 05/09/2022 61  >39 mg/dL Final   VLDL Cholesterol Cal 05/09/2022 45 (H)  5 - 40 mg/dL  Final   LDL Chol Calc (NIH) 05/09/2022 62  0 - 99 mg/dL Final   Chol/HDL Ratio 05/09/2022 2.8  0.0 - 5.0 ratio Final   Comment:                                   T. Chol/HDL Ratio                                             Men  Women                               1/2 Avg.Risk  3.4    3.3                                   Avg.Risk  5.0    4.4                                2X Avg.Risk  9.6    7.1                                3X Avg.Risk 23.4   11.0   Office Visit on 04/23/2022  Component Date Value Ref Range Status   Summary 04/23/2022 Note   Final   Comment: ==================================================================== ToxAssure Select,+Antidepr,UR ==================================================================== Test                             Result       Flag       Units  Drug Absent but Declared for Prescription Verification   Buprenorphine                  Not Detected UNEXPECTED ng/mg creat    Low dose buprenorphine is not always detected even when used as    directed.  ==================================================================== Test                      Result    Flag   Units      Ref Range   Creatinine              158              mg/dL      >=20 ==================================================================== Declared Medications:  The flagging and interpretation on this report are based on the  following declared medications.  Unexpected results may arise from  inaccuracies in the declared medications.   **Note: The testing scope of this panel does not include small to  moderate amount                          s of these reported medications:   Buprenorphine ==================================================================== For clinical  consultation, please call [phone]. ====================================================================    Glucose 04/23/2022 90  70 - 99 mg/dL Final   BUN 04/23/2022 22  6 - 24 mg/dL Final   Creatinine, Ser 04/23/2022 0.66 (L)  0.76 - 1.27 mg/dL Final   eGFR 04/23/2022 111  >  59 mL/min/1.73 Final   BUN/Creatinine Ratio 04/23/2022 33 (H)  9 - 20 Final   Sodium 04/23/2022 139  134 - 144 mmol/L Final   Potassium 04/23/2022 4.3  3.5 - 5.2 mmol/L Final   Chloride 04/23/2022 98  96 - 106 mmol/L Final   CO2 04/23/2022 25  20 - 29 mmol/L Final   Anion Gap 04/23/2022 16.0  10.0 - 18.0 mmol/L Final   Calcium 04/23/2022 10.1  8.7 - 10.2 mg/dL Final    Blood Alcohol level:  Lab Results  Component Value Date   ETH <10 99991111    Metabolic Disorder Labs: No results found for: "HGBA1C", "MPG" No results found for: "PROLACTIN" Lab Results  Component Value Date   CHOL 212 (H) 09/25/2022   TRIG 373 (H) 09/25/2022   HDL 45 09/25/2022   CHOLHDL 4.7 09/25/2022   VLDL 75 (H) 09/25/2022   LDLCALC 92 09/25/2022   LDLCALC 62 05/09/2022    Therapeutic Lab Levels: No results found for: "LITHIUM" No results found for: "VALPROATE" No results found for: "CBMZ"  Physical Findings   GAD-7    [facility] Office Visit from 09/09/2018 in [facility]  Total GAD-7 Score 15      PHQ2-9    [facility] ED from 09/25/2022 in [facility] Office Visit from 04/23/2022 in [facility] from 03/19/2022 in [facility] Office Visit from 03/14/2022 in [facility] Patient Outreach Telephone from 02/28/2022 in [facility]  PHQ-2 Total Score 6 0 0 0 0  PHQ-9 Total Score 22 5 -- -- --      Flowsheet Row ED from 09/25/2022 in [facility] ED from 08/04/2022 in [facility] ED from 07/31/2022 in [facility] Emergency Department at [place] No Risk No Risk No Risk        Musculoskeletal  Strength & Muscle Tone: within normal limits Gait & Station: normal Patient leans: N/A  Psychiatric Specialty Exam  Presentation General Appearance: Appropriate for Environment  Eye Contact:Fair  Speech:Clear and Coherent  Speech Volume:Normal  Handedness:-- (not assessed)   Mood and Affect  Mood:Euthymic  Affect:Congruent   Thought Process  Thought Processes:Coherent; Linear  Descriptions of Associations:Intact  Orientation:Full (Time, Place and Person)  Thought Content:Logical    Hallucinations:Hallucinations: None  Ideas of Reference:None  Suicidal Thoughts:Suicidal Thoughts: No  Homicidal Thoughts:Homicidal Thoughts: No   Sensorium  Memory:Immediate Fair; Recent Fair; Remote Fair  Judgment:Fair  Insight:Fair   Executive Functions  Concentration:Fair  Attention Span:Fair  [place] of Knowledge:Fair  Language:Fair   Psychomotor Activity  Psychomotor Activity:Psychomotor Activity: Normal   Assets  Assets:Communication Skills; Resilience   Sleep  Sleep:Sleep: Fair   Nutritional Assessment (For OBS and FBC admissions only) Has the patient had a weight loss or gain of 10 pounds or more in the last 3 months?: No Has the patient had a decrease in food intake/or appetite?: Yes Does the patient have dental problems?: No Does the patient have eating habits or behaviors that may be indicators of an eating disorder including binging or inducing vomiting?: No Has the patient recently lost weight without trying?: 0 Has the patient been eating poorly because of a decreased appetite?: 0 Malnutrition Screening Tool Score: 0    Physical Exam Constitutional:      Appearance: the patient is not toxic-appearing.  Pulmonary:  Effort: Pulmonary effort is normal.  Neurological:     General: No focal deficit  present.     Mental Status: the patient is alert and oriented to person, place, and time.   Review of Systems  Respiratory:  Negative for shortness of breath.   Cardiovascular:  Negative for chest pain.  Gastrointestinal:  Negative for abdominal pain, constipation, diarrhea, nausea and vomiting.  Neurological:  Negative for headaches.    BP (!) 125/106 (BP Location: Left Arm)   Pulse 93   Temp 98.1 F (36.7 C) (Oral)   Resp 16   SpO2 99%   Assessment and Plan:  Status: Voluntary  Opioid use disorder, fentanyl use - COWS, clonidine taper, comfort PRNs - Zyprexa for agitation  Home medications as ordered  Medical: - Lab work unremarkable - A1c in process     Corky Sox, MD 09/26/2022 3:06 PM

## 2022-09-27 ENCOUNTER — Emergency Department (HOSPITAL_COMMUNITY)
Admission: EM | Admit: 2022-09-27 | Discharge: 2022-09-27 | Discharge status: Against Medical Advice | Payer: Medicare HMO | Attending: Emergency Medicine | Admitting: Emergency Medicine

## 2022-09-27 ENCOUNTER — Other Ambulatory Visit: Payer: Self-pay

## 2022-09-27 DIAGNOSIS — F1911 Other psychoactive substance abuse, in remission: Secondary | ICD-10-CM | POA: Insufficient documentation

## 2022-09-27 DIAGNOSIS — F1110 Opioid abuse, uncomplicated: Secondary | ICD-10-CM | POA: Insufficient documentation

## 2022-09-27 DIAGNOSIS — D72829 Elevated white blood cell count, unspecified: Secondary | ICD-10-CM | POA: Insufficient documentation

## 2022-09-27 DIAGNOSIS — F1994 Other psychoactive substance use, unspecified with psychoactive substance-induced mood disorder: Secondary | ICD-10-CM | POA: Diagnosis not present

## 2022-09-27 DIAGNOSIS — I10 Essential (primary) hypertension: Secondary | ICD-10-CM | POA: Diagnosis not present

## 2022-09-27 DIAGNOSIS — R7989 Other specified abnormal findings of blood chemistry: Secondary | ICD-10-CM | POA: Insufficient documentation

## 2022-09-27 DIAGNOSIS — Z1152 Encounter for screening for COVID-19: Secondary | ICD-10-CM | POA: Insufficient documentation

## 2022-09-27 DIAGNOSIS — Z79899 Other long term (current) drug therapy: Secondary | ICD-10-CM | POA: Insufficient documentation

## 2022-09-27 DIAGNOSIS — Z5329 Procedure and treatment not carried out because of patient's decision for other reasons: Secondary | ICD-10-CM | POA: Insufficient documentation

## 2022-09-27 LAB — COMPREHENSIVE METABOLIC PANEL
ALT: 64 U/L — ABNORMAL HIGH (ref 0–44)
AST: 34 U/L (ref 15–41)
Albumin: 4.8 g/dL (ref 3.5–5.0)
Alkaline Phosphatase: 82 U/L (ref 38–126)
Anion gap: 12 (ref 5–15)
BUN: 20 mg/dL (ref 6–20)
CO2: 22 mmol/L (ref 22–32)
Calcium: 10.7 mg/dL — ABNORMAL HIGH (ref 8.9–10.3)
Chloride: 99 mmol/L (ref 98–111)
Creatinine, Ser: 0.83 mg/dL (ref 0.61–1.24)
GFR, Estimated: >60 mL/min (ref >60)
Glucose, Bld: 124 mg/dL — ABNORMAL HIGH (ref 70–99)
Potassium: 3.6 mmol/L (ref 3.5–5.1)
Sodium: 133 mmol/L — ABNORMAL LOW (ref 135–145)
Total Bilirubin: 1.1 mg/dL (ref 0.3–1.2)
Total Protein: 7.7 g/dL (ref 6.5–8.1)

## 2022-09-27 LAB — CBC
HCT: 48.4 % (ref 39.0–52.0)
Hemoglobin: 17.7 g/dL — ABNORMAL HIGH (ref 13.0–17.0)
MCH: 30.8 pg (ref 26.0–34.0)
MCHC: 36.6 g/dL — ABNORMAL HIGH (ref 30.0–36.0)
MCV: 84.2 fL (ref 80.0–100.0)
Platelets: 282 10*3/uL (ref 150–400)
RBC: 5.75 MIL/uL (ref 4.22–5.81)
RDW: 13 % (ref 11.5–15.5)
WBC: 15.9 10*3/uL — ABNORMAL HIGH (ref 4.0–10.5)
nRBC: 0 % (ref 0.0–0.2)

## 2022-09-27 LAB — ETHANOL: Alcohol, Ethyl (B): <10 mg/dL (ref <10)

## 2022-09-27 LAB — PROLACTIN: Prolactin: 4.6 ng/mL (ref 3.6–25.2)

## 2022-09-27 MED ORDER — ONDANSETRON 4 MG PO TBDP
4.0000 mg | ORAL_TABLET | Freq: Once | ORAL | Status: AC
  Administered 2022-09-27: 4 mg via ORAL
  Filled 2022-09-27: qty 1

## 2022-09-27 MED ORDER — TRAMADOL HCL 50 MG PO TABS
50.0000 mg | ORAL_TABLET | ORAL | Status: AC
  Administered 2022-09-27: 50 mg via ORAL
  Filled 2022-09-27: qty 1

## 2022-09-27 NOTE — ED Notes (Signed)
Pt insisted to go the ER . EMS took pt to the [facility]  ER.

## 2022-09-27 NOTE — ED Notes (Signed)
Pt is sleeping. No distress noted. Will continue to monitor for safety. 

## 2022-09-27 NOTE — ED Notes (Signed)
Pt stated he is pain, NP Carloyn Manner ordered tramadol for pt. Writer administered medication

## 2022-09-27 NOTE — Progress Notes (Signed)
Nursing staff reported that patient had an episode of emesis and requested provider response.  This Probation officer accompanied provider to see the patient.  He was asleep at this time,

## 2022-09-27 NOTE — ED Notes (Signed)
PT was on the bathroom on the floor saying he doesn't feel good puking . When writer asked pt what was going on, pt stated he wasn't feeling well. NP Carloyn Manner and [facility] were informed and came to check up on pt.

## 2022-09-27 NOTE — ED Triage Notes (Signed)
Patient arrived with EMS for detox treatment /Fentanyl Addiction , denies SI or HI . No hallucinations .

## 2022-09-27 NOTE — ED Provider Notes (Signed)
Writer notify that pt c/o pain all over his body,  order was given for Ultram 50 mg .  Pt demanded he wanted to go to the ED. Pt vital signs where WLN.  Please the flow sheet.  Spoke with Dr Cottie Banda MD at [facility] and give him information on about patient.

## 2022-09-27 NOTE — ED Notes (Signed)
Writer administered zofran 4mg  and acetaminophen 650mg  to pt for pain and vomiting.

## 2022-09-27 NOTE — ED Provider Notes (Signed)
[facility] Provider Note   CSN: [id number] Arrival date & time: 09/27/22  [id number]     History  Chief Complaint  Patient presents with   Drug Addiction     Joseph Ayala is a 57 y.o. male.  The history is provided by the patient and medical records.   57 y.o. M with hx of chronic pain, HLP, HTN, substance abuse, presenting to the ED from [facility] requesting ER evaluation.  He has been there for the past few days detoxing off fentanyl.  States he is feeling poorly with nausea/vomiting.  He denies diarrhea.  No fever/chills.  No alcohol use.  Last fentanyl use was Saturday 09/22/22.    Per chart review, it appears they attempted to manage his symptoms at [facility], however he demanded to come to the ER.  He is requesting pain medication here.  Denies SI/HI.  Home Medications Prior to Admission medications   Medication Sig Start Date End Date Taking? Authorizing Provider  Buprenorphine HCl-Naloxone HCl 8-2 MG FILM Place 1 Film under the tongue in the morning, at noon, and at bedtime. Patient taking differently: Place 1 Film under the tongue 3 (three) times daily. 09/06/22   [name], MD  chlorthalidone (HYGROTON) 25 MG tablet Take 1 tablet (25 mg total) by mouth daily. 03/14/22   Masters, Katie, DO  loratadine (CLARITIN) 10 MG tablet Take 1 tablet (10 mg total) by mouth daily. 05/02/22   Nyoka Lint, PA-C  metoprolol succinate (TOPROL-XL) 50 MG 24 hr tablet Take 1 tablet (50 mg total) by mouth daily. Take with or immediately following a meal. 07/15/22   [name], MD  sildenafil (VIAGRA) 25 MG tablet Take 1 tablet (25 mg total) by mouth as needed for erectile dysfunction. 06/20/22 06/20/23  [name], MD  testosterone cypionate (DEPOTESTOSTERONE CYPIONATE) 200 MG/ML injection Inject 2 mLs (400 mg total) into the muscle every 14 (fourteen) days. 09/12/22   [name], MD      Allergies    Ivp dye [iodinated  contrast media], Lyrica cr [pregabalin er], Vicodin [hydrocodone-acetaminophen], and Quinolones    Review of Systems   Review of Systems  Gastrointestinal:  Positive for nausea and vomiting.  All other systems reviewed and are negative.   Physical Exam Updated Vital Signs BP (!) 137/98 (BP Location: Right Arm)   Pulse 92   Temp 98.3 F (36.8 C) (Oral)   Resp 18   SpO2 97%   Physical Exam Vitals and nursing note reviewed.  Constitutional:      Appearance: He is well-developed.     Comments: No active emesis  HENT:     Head: Normocephalic and atraumatic.  Eyes:     Conjunctiva/sclera: Conjunctivae normal.     Pupils: Pupils are equal, round, and reactive to light.  Cardiovascular:     Rate and Rhythm: Normal rate and regular rhythm.     Heart sounds: Normal heart sounds.  Pulmonary:     Effort: Pulmonary effort is normal.     Breath sounds: Normal breath sounds.  Abdominal:     General: Bowel sounds are normal.     Palpations: Abdomen is soft.     Tenderness: There is no guarding.     Hernia: No hernia is present.     Comments: Soft, non-tender, no peritoneal signs  Musculoskeletal:        General: Normal range of motion.     Cervical back: Normal range of  motion.  Skin:    General: Skin is warm and dry.  Neurological:     Mental Status: He is alert and oriented to person, place, and time.     ED Results / Procedures / Treatments   Labs (all labs ordered are listed, but only abnormal results are displayed) Labs Reviewed  COMPREHENSIVE METABOLIC PANEL - Abnormal; Notable for the following components:      Result Value   Sodium 133 (*)    Glucose, Bld 124 (*)    Calcium 10.7 (*)    ALT 64 (*)    All other components within normal limits  CBC - Abnormal; Notable for the following components:   WBC 15.9 (*)    Hemoglobin 17.7 (*)    MCHC 36.6 (*)    All other components within normal limits  ETHANOL  RAPID URINE DRUG SCREEN, HOSP PERFORMED     EKG None  Radiology No results found.  Procedures Procedures    Medications Ordered in ED Medications  ondansetron (ZOFRAN-ODT) disintegrating tablet 4 mg (4 mg Oral Given 09/27/22 0547)    ED Course/ Medical Decision Making/ A&P                             Medical Decision Making Amount and/or Complexity of Data Reviewed Labs: ordered. ECG/medicine tests: ordered and independent interpretation performed.  Risk Prescription drug management.   57 y.o. M sent in from [facility] due to pain and reported nausea/vomiting.  Per chart review, it appears that he refused medications at [facility] for his symptoms, instead demanding to come to the ED.  He is afebrile, non-toxic in appearance.  He does not have any active vomiting here and does not appear to be in any acute distress.   His abdominal exam is  benign.  Labs were obtained during triage-- does have leukocytosis but no electrolyte deranagement.  LFT's minimally elevated but similar to prior.  At this point, he has not had fentanyl in several days, does not appear to be in acute withdrawal at this time.  His abdomen is soft and non-tender, do not feel he needs CT.  He is medically cleared to return to [facility] for ongoing care.  It appears they are working on residential rehab placement.  6:14 AM Spoke with NP Chinwendu-- patient can be transferred back to [facility] at 0700.  EMTALA completed.  Final Clinical Impression(s) / ED Diagnoses Final diagnoses:  Opioid abuse [facility])    Rx / Tracy Orders ED Discharge Orders     None         Larene Pickett, PA-C 09/27/22 [id number]    Quintella Reichert, MD 09/27/22 [phone]

## 2022-09-27 NOTE — Progress Notes (Signed)
Patient requests to go to an emergency room.  He reports the reason to be that he has severe pain all over his body. He can not identify any specific area of his body.  He is adamant that he wants to be transferred to an emergency room.  His vital signs were obtained and resulted:  Pulse:  98 O2:      98 % on room air  BP:     126/86

## 2022-09-28 ENCOUNTER — Other Ambulatory Visit (HOSPITAL_COMMUNITY): Payer: Self-pay

## 2022-09-29 DIAGNOSIS — F1520 Other stimulant dependence, uncomplicated: Secondary | ICD-10-CM | POA: Diagnosis not present

## 2022-09-29 DIAGNOSIS — G629 Polyneuropathy, unspecified: Secondary | ICD-10-CM | POA: Diagnosis not present

## 2022-09-29 DIAGNOSIS — G8929 Other chronic pain: Secondary | ICD-10-CM | POA: Diagnosis not present

## 2022-09-29 DIAGNOSIS — I10 Essential (primary) hypertension: Secondary | ICD-10-CM | POA: Diagnosis not present

## 2022-09-29 DIAGNOSIS — Z9151 Personal history of suicidal behavior: Secondary | ICD-10-CM | POA: Diagnosis not present

## 2022-09-29 DIAGNOSIS — F339 Major depressive disorder, recurrent, unspecified: Secondary | ICD-10-CM | POA: Diagnosis not present

## 2022-09-29 DIAGNOSIS — F419 Anxiety disorder, unspecified: Secondary | ICD-10-CM | POA: Diagnosis not present

## 2022-09-29 DIAGNOSIS — F1123 Opioid dependence with withdrawal: Secondary | ICD-10-CM | POA: Diagnosis not present

## 2022-09-29 DIAGNOSIS — F1124 Opioid dependence with opioid-induced mood disorder: Secondary | ICD-10-CM | POA: Diagnosis not present

## 2022-10-04 ENCOUNTER — Other Ambulatory Visit: Payer: Self-pay | Admitting: Student in an Organized Health Care Education/Training Program

## 2022-10-04 ENCOUNTER — Other Ambulatory Visit (HOSPITAL_COMMUNITY): Payer: Self-pay

## 2022-10-04 ENCOUNTER — Telehealth: Payer: Self-pay | Admitting: *Deleted

## 2022-10-04 ENCOUNTER — Other Ambulatory Visit: Payer: Self-pay

## 2022-10-04 DIAGNOSIS — F1190 Opioid use, unspecified, uncomplicated: Secondary | ICD-10-CM

## 2022-10-04 MED ORDER — GABAPENTIN 300 MG PO CAPS
900.0000 mg | ORAL_CAPSULE | Freq: Three times a day (TID) | ORAL | 0 refills | Status: DC
  Filled 2022-10-04: qty 270, 30d supply, fill #0

## 2022-10-04 MED ORDER — DULOXETINE HCL 30 MG PO CPEP
30.0000 mg | ORAL_CAPSULE | Freq: Two times a day (BID) | ORAL | 0 refills | Status: DC
  Filled 2022-10-04: qty 60, 30d supply, fill #0

## 2022-10-04 MED ORDER — AMLODIPINE BESYLATE 5 MG PO TABS
5.0000 mg | ORAL_TABLET | Freq: Every day | ORAL | 0 refills | Status: DC
  Filled 2022-10-04: qty 30, 30d supply, fill #0

## 2022-10-04 MED ORDER — QUETIAPINE FUMARATE 150 MG PO TABS
150.0000 mg | ORAL_TABLET | Freq: Every day | ORAL | 0 refills | Status: DC
  Filled 2022-10-04: qty 30, 30d supply, fill #0

## 2022-10-04 MED ORDER — BUPRENORPHINE HCL-NALOXONE HCL 8-2 MG SL FILM
1.0000 | ORAL_FILM | Freq: Three times a day (TID) | SUBLINGUAL | 0 refills | Status: DC
  Filled 2022-10-04: qty 90, 30d supply, fill #0

## 2022-10-04 MED ORDER — METOPROLOL SUCCINATE ER 50 MG PO TB24
50.0000 mg | ORAL_TABLET | Freq: Every day | ORAL | 0 refills | Status: DC
  Filled 2022-10-04: qty 30, 30d supply, fill #0

## 2022-10-04 NOTE — Telephone Encounter (Signed)
Call from pt - stated he has been in Rehab x 7 days and being discharge tomorrow. Requesting refill on Suboxone - informed pt it was refilled today.

## 2022-10-04 NOTE — Telephone Encounter (Signed)
Next appt scheduled  4/8 with PCP. 

## 2022-10-09 ENCOUNTER — Encounter (HOSPITAL_COMMUNITY): Payer: Self-pay

## 2022-10-09 ENCOUNTER — Ambulatory Visit (HOSPITAL_COMMUNITY): Payer: Medicare HMO | Admitting: Psychiatry

## 2022-10-10 ENCOUNTER — Other Ambulatory Visit: Payer: Self-pay | Admitting: Surgery

## 2022-10-10 ENCOUNTER — Other Ambulatory Visit (HOSPITAL_COMMUNITY): Payer: Self-pay

## 2022-10-11 ENCOUNTER — Encounter (HOSPITAL_COMMUNITY): Payer: Self-pay | Admitting: Licensed Clinical Social Worker

## 2022-10-11 ENCOUNTER — Encounter: Payer: Self-pay | Admitting: Student in an Organized Health Care Education/Training Program

## 2022-10-11 ENCOUNTER — Other Ambulatory Visit (HOSPITAL_COMMUNITY): Payer: Self-pay

## 2022-10-11 ENCOUNTER — Ambulatory Visit (HOSPITAL_COMMUNITY): Payer: Self-pay | Admitting: Licensed Clinical Social Worker

## 2022-10-11 ENCOUNTER — Ambulatory Visit
Admission: RE | Admit: 2022-10-11 | Discharge: 2022-10-11 | Disposition: A | Discharge status: Home / Self Care | Payer: Self-pay | Source: Ambulatory Visit | Attending: Cardiology | Admitting: Cardiology

## 2022-10-11 DIAGNOSIS — R911 Solitary pulmonary nodule: Secondary | ICD-10-CM

## 2022-10-11 MED ORDER — PREDNISONE 50 MG PO TABS
ORAL_TABLET | ORAL | 0 refills | Status: DC
  Filled 2022-10-11: qty 3, 7d supply, fill #0
  Filled 2022-10-11: qty 3, 1d supply, fill #0

## 2022-10-15 ENCOUNTER — Encounter: Payer: Self-pay | Admitting: Student in an Organized Health Care Education/Training Program

## 2022-10-15 ENCOUNTER — Other Ambulatory Visit (HOSPITAL_COMMUNITY): Payer: Self-pay

## 2022-10-15 ENCOUNTER — Ambulatory Visit (INDEPENDENT_AMBULATORY_CARE_PROVIDER_SITE_OTHER): Payer: Medicare Other | Admitting: Student in an Organized Health Care Education/Training Program

## 2022-10-15 VITALS — BP 119/78 | HR 79 | Temp 97.5°F | Ht 71.0 in | Wt 214.6 lb

## 2022-10-15 DIAGNOSIS — R911 Solitary pulmonary nodule: Secondary | ICD-10-CM | POA: Diagnosis not present

## 2022-10-15 DIAGNOSIS — I10 Essential (primary) hypertension: Secondary | ICD-10-CM | POA: Diagnosis not present

## 2022-10-15 DIAGNOSIS — R7989 Other specified abnormal findings of blood chemistry: Secondary | ICD-10-CM | POA: Diagnosis not present

## 2022-10-15 DIAGNOSIS — F1190 Opioid use, unspecified, uncomplicated: Secondary | ICD-10-CM

## 2022-10-15 DIAGNOSIS — F1994 Other psychoactive substance use, unspecified with psychoactive substance-induced mood disorder: Secondary | ICD-10-CM

## 2022-10-15 DIAGNOSIS — I712 Thoracic aortic aneurysm, without rupture, unspecified: Secondary | ICD-10-CM

## 2022-10-15 MED ORDER — TESTOSTERONE 50 MG/5GM (1%) TD GEL
5.0000 g | Freq: Every day | TRANSDERMAL | 1 refills | Status: DC
  Filled 2022-10-15: qty 150, 30d supply, fill #0

## 2022-10-15 MED ORDER — ALBUTEROL SULFATE HFA 108 (90 BASE) MCG/ACT IN AERS
2.0000 | INHALATION_SPRAY | Freq: Four times a day (QID) | RESPIRATORY_TRACT | 2 refills | Status: DC | PRN
  Filled 2022-10-15: qty 6.7, 25d supply, fill #0
  Filled 2022-11-27: qty 6.7, 25d supply, fill #1
  Filled 2023-01-09: qty 6.7, 25d supply, fill #2

## 2022-10-15 NOTE — Assessment & Plan Note (Signed)
Chronic issue.  Not well-controlled as we have not been able to get steady-state of serum testosterone levels on every 14-day injections.  I am going to switch him from IM therapy to topical testosterone 50 mg once daily.  Will check testosterone levels at next visit along with a CBC to ensure efficacy and safety.

## 2022-10-15 NOTE — Assessment & Plan Note (Signed)
Resolving groundglass opacity consistent with benign nodule seen on follow-up CT scan last week.  I think this was related to inhaled drug use and recent pneumonia.

## 2022-10-15 NOTE — Assessment & Plan Note (Signed)
Chronic.  Just finished inpatient treatment which seems to have been helpful at managing the underlying psychiatric mood disorder.  Plan to continue with Suboxone 8 mg 3 times daily.  Last filled March 28.  Will check urine tox assure today.  Follow-up with [place] in 2 weeks.

## 2022-10-15 NOTE — Assessment & Plan Note (Signed)
Recent behavioral health stay for management of substance-induced mood disorder and likely bipolar disorder.  Being managed by intensive outpatient psychiatry.  They are medically managing with Seroquel 150 mg at bedtime and Cymbalta 30 mg twice daily.

## 2022-10-15 NOTE — Assessment & Plan Note (Signed)
Chronic and stable.  Blood pressure well-controlled today.  Plan to continue chlorthalidone 25 mg daily and metoprolol 50 mg daily given comorbid mild aortic aneurysm.

## 2022-10-15 NOTE — Progress Notes (Signed)
Established Patient Office Visit  Subjective   Patient ID: Joseph Ayala, male    DOB: Mar 18, 1966  Age: 57 y.o. MRN: [id number]  Chief Complaint  Patient presents with   Follow-up    HPI  57 year old person here for follow-up of opioid use disorder.  He had a very difficult March, going through psychiatric crisis.  He was admitted to an inpatient treatment center in [place] for a few days, discharged and then admitted to [facility] behavioral health for at least 5 days.  Part of this was for medically assisted withdrawal.  He was started on treatment for bipolar disease and substance-induced mood disorder with Seroquel at night.  He had some passive thoughts of suicide which are now resolved.  He has been back home for about 2 weeks now and is doing very well.  Has good support system around him with family, doing better staying busy and not spending much time by himself right now.  Still finding Suboxone very useful in managing his cravings.  We were using it as risk reduction, patient reports that he thinks the Suboxone likely prevented him from overdosing which he was at his worst.  ROS  No fevers or chills.  No chest pain, no dyspnea.    Objective:     BP 119/78 (BP Location: Right Arm, Patient Position: Sitting, Cuff Size: Small)   Pulse 79   Temp (!) 97.5 F (36.4 C) (Oral)   Ht 5\' 11"  (1.803 m)   Wt 214 lb 9.6 oz (97.3 kg)   SpO2 99%   BMI 29.93 kg/m    Physical Exam  Gen: Well-appearing man, no distress Neck: Normal thyroid, no lymphadenopathy CV: Regular rate and rhythm with no murmurs Lungs: Clear to auscultation throughout with no crackles or wheezing Ext: Warm and well-perfused with no lower extremity edema Neuro: Alert, conversational, full strength in the upper and lower extremities Psych: Appropriate mood and affect, not depressed or anxious appearing    Assessment & Plan:   Problem List Items Addressed This Visit       High   Opioid use disorder (Chronic)     Chronic.  Just finished inpatient treatment which seems to have been helpful at managing the underlying psychiatric mood disorder.  Plan to continue with Suboxone 8 mg 3 times daily.  Last filled March 28.  Will check urine tox assure today.  Follow-up with [place] in 2 weeks.       Substance induced mood disorder - Primary (Chronic)    Recent behavioral health stay for management of substance-induced mood disorder and likely bipolar disorder.  Being managed by intensive outpatient psychiatry.  They are medically managing with Seroquel 150 mg at bedtime and Cymbalta 30 mg twice daily.      Relevant Orders   ToxAssure Select,+Antidepr,UR   Pulmonary nodule (Chronic)    Resolving groundglass opacity consistent with benign nodule seen on follow-up CT scan last week.  I think this was related to inhaled drug use and recent pneumonia.         Medium    Hypertension (Chronic)    Chronic and stable.  Blood pressure well-controlled today.  Plan to continue chlorthalidone 25 mg daily and metoprolol 50 mg daily given comorbid mild aortic aneurysm.        Low   Low testosterone (Chronic)    Chronic issue.  Not well-controlled as we have not been able to get steady-state of serum testosterone levels on every 14-day injections.  I am  going to switch him from IM therapy to topical testosterone 50 mg once daily.  Will check testosterone levels at next visit along with a CBC to ensure efficacy and safety.      Thoracic aortic aneurysm without rupture (Chronic)    We reviewed recent CT scan which showed a stable ascending thoracic aortic aneurysm at 4.5 cm.  Will continue with medical management, blood pressure control.  Continue metoprolol 50 mg daily.       Return in about 2 weeks (around 10/29/2022).    [name], MD

## 2022-10-15 NOTE — Assessment & Plan Note (Signed)
We reviewed recent CT scan which showed a stable ascending thoracic aortic aneurysm at 4.5 cm.  Will continue with medical management, blood pressure control.  Continue metoprolol 50 mg daily.

## 2022-10-16 ENCOUNTER — Other Ambulatory Visit (HOSPITAL_COMMUNITY): Payer: Self-pay

## 2022-10-17 ENCOUNTER — Other Ambulatory Visit (HOSPITAL_COMMUNITY): Payer: Self-pay

## 2022-10-17 MED ORDER — TESTOSTERONE 12.5 MG/ACT (1%) TD GEL
12.5000 mg | Freq: Every day | TRANSDERMAL | 0 refills | Status: DC
  Filled 2022-10-17 – 2022-10-18 (×4): qty 75, 30d supply, fill #0
  Filled 2022-10-18: qty 75, 15d supply, fill #0
  Filled 2022-10-29: qty 75, fill #0
  Filled 2022-11-12: qty 75, 30d supply, fill #0

## 2022-10-17 NOTE — Addendum Note (Signed)
Addended by: Erlinda Hong T on: 10/17/2022 09:18 AM   Modules accepted: Orders

## 2022-10-18 ENCOUNTER — Encounter (HOSPITAL_COMMUNITY): Payer: Self-pay

## 2022-10-18 ENCOUNTER — Other Ambulatory Visit: Payer: Self-pay

## 2022-10-18 ENCOUNTER — Ambulatory Visit (INDEPENDENT_AMBULATORY_CARE_PROVIDER_SITE_OTHER): Payer: Medicare Other | Admitting: Licensed Clinical Social Worker

## 2022-10-18 ENCOUNTER — Other Ambulatory Visit (HOSPITAL_BASED_OUTPATIENT_CLINIC_OR_DEPARTMENT_OTHER): Payer: Self-pay

## 2022-10-18 ENCOUNTER — Other Ambulatory Visit (HOSPITAL_COMMUNITY): Payer: Self-pay

## 2022-10-18 DIAGNOSIS — F1120 Opioid dependence, uncomplicated: Secondary | ICD-10-CM | POA: Diagnosis not present

## 2022-10-18 DIAGNOSIS — F1990 Other psychoactive substance use, unspecified, uncomplicated: Secondary | ICD-10-CM

## 2022-10-18 DIAGNOSIS — F39 Unspecified mood [affective] disorder: Secondary | ICD-10-CM

## 2022-10-18 LAB — TOXASSURE SELECT,+ANTIDEPR,UR

## 2022-10-18 NOTE — Progress Notes (Signed)
THERAPIST PROGRESS NOTE  Session Time: 11 a.m. to 11:57 a.m.   Type of Therapy: Individual   Therapist Response/Interventions: Solution Focused/Therapist explains to Jayshawn the risks associated with being "[employer]" encouraging him to stop using THC gummies.  The therapist talks to [facility] about being seen by a psychiatrist at this office to likely increase his Seroquel further per recommendations made in treatment and shares with him that based on his presentation and family history that a bipolar spectrum disorder needs to be considered as a rule out.   Treatment Goals addressed:  Active     Substance Use and Mood Disorder     Catrell will abstain from drugs and alcohol per self-report and random UDS while continuing to attend AA and work steps with his Sponsor.  (Initial)     Start:  10/18/22    Expected End:  04/19/23         STG: Jenner will report improvement in his mood as evidenced by his GAD-7 dropping from an 8 to a 4 or less with Teren no longer having racing thoughts or pressured speech.  (Initial)     Start:  10/18/22    Expected End:  04/19/23            Summary: Arline Asp presents for an initial visit with this therapist expressing an interest in attending CD IOP. He had several inpatient admissions last month eventually being admitted to [facility] in [place], [place] on March 23rd and discharged on March 29th.   He says that he now attends AA every day and has a Marketing executive. He has two brothers with addiction issues both of whom have been sober over twenty years. Additionally, he has numerous cousins on his father's side diagnosed with bipolar disorder. Ibraheem continues to take Suboxone and denies any drug or alcohol use except for [facility] edibles which he says that he takes for chronic pain.   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 Prior to this, he worked as a Merchandiser, retail at a Environmental health practitioner since around  2007. In 2013, he says that he went to substance use rehab and stayed clean until 2017. He was very active with his daughter's sports activities when she was in high school; however, she graduated around 2017 so he started drinking and using again almost immediately after she left for college noting that he likely had tow much idle time on his hands. He says that he had another brief period of sobriety from February of 2022 until July of 2022; however, he has used since that time up until his recent treatment episode.   When asked his reason for wanting to quit now, he says that he knew that divorce and jail were coming if he did not stop. He also has health worries saying that he has an upper aorta aneurysm.  He was started on 150 mg of Seroquel at [facility] adding that he was told that his dose would likely need to be increased to 300. He says that the Seroquel helps him to sleep as he has been going to sleep around 10 p.m. and waking up around 6 a.m. the last couple of weeks due to the Seroquel.   At the same time, Pat presents as hypomanic. He says that his thoughts race and that his wife will often have to redirect him telling him to calm down. His rate of speech is somewhat pressured and  he speaks non-stop at times catching himself  doing so and apologizes saying, "I'm going to shut-up now" only shortly after which to begin talking non-stop once more. The content of his speech is somewhat tangential. He reports that his mood is extremely good with a PHQ-9 of 0 and a GAD-7 of 8.   Progress Towards Goals: Initial  Suicidal/Homicidal: No SI or HI  Plan: Return again in 1 weeks. The therapist informs [place] that he cannot take him in CD IOP at the moment as he has no available slots; however, he suggests that he could refer him to somewhere such as the [facility] for [facility] IOP but Amarri prefers to wait for a slot to open in [facility] IOP. The therapist also explains that he would not be able to  take him in CD IOP unless he stopped using THC explaining the rational for this. Glendell says that he will discontinue the THC gummies.   Diagnosis: Opioid Use Disorder, Severe, on maintenance therapy; Polysubstance Use Disorder; and Unspecified Affective Disorder (R/O Bipolar Disorder)  Collaboration of Care: Other N/A  Patient/Guardian was advised Release of Information must be obtained prior to any record release in order to collaborate their care with an outside provider. Patient/Guardian was advised if they have not already done so to contact the registration department to sign all necessary forms in order for [place] to release information regarding their care.   Consent: Patient/Guardian gives verbal consent for treatment and assignment of benefits for services provided during this visit. Patient/Guardian expressed understanding and agreed to proceed.   Myrna Blazer, MA, LCSW, [facility], LCAS 10/18/2022

## 2022-10-19 ENCOUNTER — Telehealth: Payer: Self-pay | Admitting: *Deleted
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
## 2022-10-20 ENCOUNTER — Other Ambulatory Visit: Payer: Self-pay

## 2022-10-20 ENCOUNTER — Emergency Department (HOSPITAL_BASED_OUTPATIENT_CLINIC_OR_DEPARTMENT_OTHER): Payer: Medicare Other

## 2022-10-20 ENCOUNTER — Emergency Department (HOSPITAL_BASED_OUTPATIENT_CLINIC_OR_DEPARTMENT_OTHER)
Admission: EM | Admit: 2022-10-20 | Discharge: 2022-10-20 | Discharge status: Against Medical Advice | Payer: Medicare Other | Attending: Emergency Medicine | Admitting: Emergency Medicine

## 2022-10-20 ENCOUNTER — Encounter (HOSPITAL_BASED_OUTPATIENT_CLINIC_OR_DEPARTMENT_OTHER): Payer: Self-pay | Admitting: Emergency Medicine

## 2022-10-20 DIAGNOSIS — R531 Weakness: Secondary | ICD-10-CM | POA: Diagnosis not present

## 2022-10-20 DIAGNOSIS — G8911 Acute pain due to trauma: Secondary | ICD-10-CM | POA: Diagnosis not present

## 2022-10-20 DIAGNOSIS — Z5329 Procedure and treatment not carried out because of patient's decision for other reasons: Secondary | ICD-10-CM | POA: Insufficient documentation

## 2022-10-20 DIAGNOSIS — R519 Headache, unspecified: Secondary | ICD-10-CM | POA: Insufficient documentation

## 2022-10-20 DIAGNOSIS — M545 Low back pain, unspecified: Secondary | ICD-10-CM | POA: Diagnosis present

## 2022-10-20 DIAGNOSIS — R200 Anesthesia of skin: Secondary | ICD-10-CM | POA: Insufficient documentation

## 2022-10-20 DIAGNOSIS — M5441 Lumbago with sciatica, right side: Secondary | ICD-10-CM

## 2022-10-20 DIAGNOSIS — M542 Cervicalgia: Secondary | ICD-10-CM | POA: Diagnosis not present

## 2022-10-20 DIAGNOSIS — Y92410 Unspecified street and highway as the place of occurrence of the external cause: Secondary | ICD-10-CM | POA: Insufficient documentation

## 2022-10-20 DIAGNOSIS — I110 Hypertensive heart disease with heart failure: Secondary | ICD-10-CM | POA: Insufficient documentation

## 2022-10-20 DIAGNOSIS — Z79899 Other long term (current) drug therapy: Secondary | ICD-10-CM | POA: Diagnosis not present

## 2022-10-20 DIAGNOSIS — I5021 Acute systolic (congestive) heart failure: Secondary | ICD-10-CM | POA: Diagnosis not present

## 2022-10-20 LAB — CBC WITH DIFFERENTIAL/PLATELET
Abs Immature Granulocytes: 0.08 10*3/uL — ABNORMAL HIGH (ref 0.00–0.07)
Basophils Absolute: 0 10*3/uL (ref 0.0–0.1)
Basophils Relative: 1 %
Eosinophils Absolute: 0.1 10*3/uL (ref 0.0–0.5)
Eosinophils Relative: 2 %
HCT: 41.5 % (ref 39.0–52.0)
Hemoglobin: 13.8 g/dL (ref 13.0–17.0)
Immature Granulocytes: 1 %
Lymphocytes Relative: 19 %
Lymphs Abs: 1.1 10*3/uL (ref 0.7–4.0)
MCH: 30.8 pg (ref 26.0–34.0)
MCHC: 33.3 g/dL (ref 30.0–36.0)
MCV: 92.6 fL (ref 80.0–100.0)
Monocytes Absolute: 0.5 10*3/uL (ref 0.1–1.0)
Monocytes Relative: 9 %
Neutro Abs: 4 10*3/uL (ref 1.7–7.7)
Neutrophils Relative %: 68 %
Platelets: 158 10*3/uL (ref 150–400)
RBC: 4.48 MIL/uL (ref 4.22–5.81)
RDW: 15.7 % — ABNORMAL HIGH (ref 11.5–15.5)
WBC: 5.9 10*3/uL (ref 4.0–10.5)
nRBC: 0 % (ref 0.0–0.2)

## 2022-10-20 LAB — COMPREHENSIVE METABOLIC PANEL
ALT: 100 U/L — ABNORMAL HIGH (ref 0–44)
AST: 66 U/L — ABNORMAL HIGH (ref 15–41)
Albumin: 4.1 g/dL (ref 3.5–5.0)
Alkaline Phosphatase: 64 U/L (ref 38–126)
Anion gap: 7 (ref 5–15)
BUN: 19 mg/dL (ref 6–20)
CO2: 30 mmol/L (ref 22–32)
Calcium: 9.4 mg/dL (ref 8.9–10.3)
Chloride: 101 mmol/L (ref 98–111)
Creatinine, Ser: 0.68 mg/dL (ref 0.61–1.24)
GFR, Estimated: >60 mL/min (ref >60)
Glucose, Bld: 110 mg/dL — ABNORMAL HIGH (ref 70–99)
Potassium: 3.6 mmol/L (ref 3.5–5.1)
Sodium: 138 mmol/L (ref 135–145)
Total Bilirubin: 0.4 mg/dL (ref 0.3–1.2)
Total Protein: 6.4 g/dL — ABNORMAL LOW (ref 6.5–8.1)

## 2022-10-20 LAB — ACETAMINOPHEN LEVEL: Acetaminophen (Tylenol), Serum: <10 ug/mL — ABNORMAL LOW (ref 10–30)

## 2022-10-20 MED ORDER — HYDROCORTISONE SOD SUC (PF) 100 MG IJ SOLR
200.0000 mg | Freq: Once | INTRAMUSCULAR | Status: AC
  Administered 2022-10-20: 200 mg via INTRAVENOUS
  Filled 2022-10-20: qty 4

## 2022-10-20 MED ORDER — DIPHENHYDRAMINE HCL 50 MG/ML IJ SOLN
25.0000 mg | Freq: Once | INTRAMUSCULAR | Status: AC
  Administered 2022-10-20: 25 mg via INTRAVENOUS
  Filled 2022-10-20: qty 1

## 2022-10-20 MED ORDER — PROCHLORPERAZINE EDISYLATE 10 MG/2ML IJ SOLN
10.0000 mg | Freq: Once | INTRAMUSCULAR | Status: AC
  Administered 2022-10-20: 10 mg via INTRAVENOUS
  Filled 2022-10-20: qty 2
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
## 2022-10-20 NOTE — ED Notes (Signed)
EDP went back into pt room to see pt and pt noted not to be in the room. This nurse spoke with registration and they said that pt was seen leaving fast to parking lot. Pt was contacted via phone by this nurse and spoke with pt. Pt reports that he was feeling better and that he had to leave. States "something just didn't feel right" " I had to leave. Pt states I pulled the IV out of my arm. You can check the trash.   Staff checked the trash and IV cath was intact and noted in trash.
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
## 2022-10-22 ENCOUNTER — Encounter: Payer: Self-pay | Admitting: *Deleted

## 2022-10-23 ENCOUNTER — Other Ambulatory Visit (HOSPITAL_COMMUNITY): Payer: Self-pay

## 2022-10-23 DIAGNOSIS — G4730 Sleep apnea, unspecified: Secondary | ICD-10-CM

## 2022-10-23 HISTORY — DX: Sleep apnea, unspecified: G47.30

## 2022-10-24 ENCOUNTER — Other Ambulatory Visit (HOSPITAL_COMMUNITY): Payer: Self-pay

## 2022-10-24 ENCOUNTER — Other Ambulatory Visit: Payer: Self-pay | Admitting: Internal Medicine

## 2022-10-24 ENCOUNTER — Encounter: Payer: Self-pay | Admitting: Student in an Organized Health Care Education/Training Program

## 2022-10-24 DIAGNOSIS — B370 Candidal stomatitis: Secondary | ICD-10-CM

## 2022-10-24 MED ORDER — NYSTATIN 100000 UNIT/ML MT SUSP
5.0000 mL | Freq: Four times a day (QID) | OROMUCOSAL | 1 refills | Status: DC
  Filled 2022-10-24 (×2): qty 90, 5d supply, fill #0
  Filled 2022-10-29: qty 90, 5d supply, fill #1

## 2022-10-25 ENCOUNTER — Ambulatory Visit (HOSPITAL_COMMUNITY): Payer: Medicare Other | Admitting: Licensed Clinical Social Worker

## 2022-10-26 ENCOUNTER — Other Ambulatory Visit (HOSPITAL_COMMUNITY): Payer: Self-pay

## 2022-10-26 ENCOUNTER — Other Ambulatory Visit: Payer: Self-pay | Admitting: Student in an Organized Health Care Education/Training Program

## 2022-10-29 ENCOUNTER — Other Ambulatory Visit (HOSPITAL_COMMUNITY): Payer: Self-pay

## 2022-10-29 ENCOUNTER — Other Ambulatory Visit: Payer: Self-pay

## 2022-10-29 ENCOUNTER — Ambulatory Visit (INDEPENDENT_AMBULATORY_CARE_PROVIDER_SITE_OTHER): Payer: Medicare Other

## 2022-10-29 VITALS — BP 111/78 | HR 90 | Temp 98.7°F | Ht 71.0 in | Wt 213.0 lb

## 2022-10-29 DIAGNOSIS — F1994 Other psychoactive substance use, unspecified with psychoactive substance-induced mood disorder: Secondary | ICD-10-CM

## 2022-10-29 DIAGNOSIS — S060X0D Concussion without loss of consciousness, subsequent encounter: Secondary | ICD-10-CM

## 2022-10-29 DIAGNOSIS — S060XAA Concussion with loss of consciousness status unknown, initial encounter: Secondary | ICD-10-CM | POA: Insufficient documentation

## 2022-10-29 DIAGNOSIS — F1190 Opioid use, unspecified, uncomplicated: Secondary | ICD-10-CM | POA: Diagnosis present

## 2022-10-29 DIAGNOSIS — R7989 Other specified abnormal findings of blood chemistry: Secondary | ICD-10-CM | POA: Diagnosis not present

## 2022-10-29 MED ORDER — GABAPENTIN 300 MG PO CAPS
900.0000 mg | ORAL_CAPSULE | Freq: Three times a day (TID) | ORAL | 1 refills | Status: DC
  Filled 2022-10-29 – 2022-11-01 (×3): qty 270, 30d supply, fill #0
  Filled 2022-11-27: qty 270, 30d supply, fill #1

## 2022-10-29 MED ORDER — QUETIAPINE FUMARATE 150 MG PO TABS
150.0000 mg | ORAL_TABLET | Freq: Every day | ORAL | 0 refills | Status: DC
  Filled 2022-10-29: qty 10, 10d supply, fill #0

## 2022-10-29 NOTE — Telephone Encounter (Signed)
This medication is being managed by psychiatry team. Can we route this refill request to their office please?

## 2022-10-29 NOTE — Patient Instructions (Addendum)
Joseph Ayala, it was a pleasure seeing you today! You endorsed feeling well today. Below are some of the things we talked about this visit. We look forward to seeing you in the follow up appointment!  Today we discussed: I refilled gabapentin and a short supply of seroquel.   I'm checking your labs today for testosterone and will call with results.  I have ordered the following labs today:  Lab Orders         Testosterone, Total         CBC with Diff       Referrals ordered today:   Referral Orders  No referral(s) requested today     I have ordered the following medication/changed the following medications:   Stop the following medications: Medications Discontinued During This Encounter  Medication Reason   gabapentin (NEURONTIN) 300 MG capsule Reorder   QUEtiapine Fumarate 150 MG TABS Reorder     Start the following medications: Meds ordered this encounter  Medications   gabapentin (NEURONTIN) 300 MG capsule    Sig: Take 3 capsules (900 mg total) by mouth 3 (three) times daily for Neuropathy    Dispense:  270 capsule    Refill:  1   QUEtiapine Fumarate 150 MG TABS    Sig: Take 150 mg by mouth at bedtime for Insomnia    Dispense:  10 tablet    Refill:  0     Follow-up:  4 weeks    Please make sure to arrive 15 minutes prior to your next appointment. If you arrive late, you may be asked to reschedule.   We look forward to seeing you next time. Please call our clinic at [phone] if you have any questions or concerns. The best time to call is Monday-Friday from 9am-4pm, but there is someone available 24/7. If after hours or the weekend, call the main hospital number and ask for the Internal Medicine Resident On-Call. If you need medication refills, please notify your pharmacy one week in advance and they will send [place] a request.  Thank you for letting us take part in your care. Wishing you the best!  Thank you, Lyndle Herrlich MD

## 2022-10-29 NOTE — Assessment & Plan Note (Signed)
Doing well on suboxone, no relapses or withdrawal symptoms. Has a lot of support infrastructure at home with wife who is a Engineer, civil (consulting) and AA meetings. Utox at last visit looks good, will continue.

## 2022-10-30 ENCOUNTER — Other Ambulatory Visit: Payer: Self-pay

## 2022-10-30 NOTE — Assessment & Plan Note (Signed)
Switch from IM testosterone injections to topical testosterone at last visit.  He says he has been doing well with this.  Will check testosterone and CBC today.
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
## 2022-10-30 NOTE — Assessment & Plan Note (Signed)
Ran out of his quetiapine recently.  Needs a few days worth of until he can see his behavioral health clinician and later this month.  Will give him a short supply of Seroquel to bridge him until his psychiatry appointment

## 2022-10-31 NOTE — Progress Notes (Signed)
Internal Medicine Clinic Attending  Case discussed with Dr. Sridharan  At the time of the visit.  We reviewed the resident's history and exam and pertinent patient test results.  I agree with the assessment, diagnosis, and plan of care documented in the resident's note.  

## 2022-11-01 ENCOUNTER — Other Ambulatory Visit: Payer: Self-pay | Admitting: Internal Medicine

## 2022-11-01 ENCOUNTER — Other Ambulatory Visit (HOSPITAL_COMMUNITY): Payer: Self-pay

## 2022-11-01 DIAGNOSIS — F1190 Opioid use, unspecified, uncomplicated: Secondary | ICD-10-CM

## 2022-11-01 MED ORDER — BUPRENORPHINE HCL-NALOXONE HCL 8-2 MG SL FILM
1.0000 | ORAL_FILM | Freq: Three times a day (TID) | SUBLINGUAL | 0 refills | Status: DC
  Filled 2022-11-01: qty 90, 30d supply, fill #0

## 2022-11-06 ENCOUNTER — Ambulatory Visit (HOSPITAL_BASED_OUTPATIENT_CLINIC_OR_DEPARTMENT_OTHER): Payer: Medicare Other | Admitting: Psychiatry

## 2022-11-06 ENCOUNTER — Encounter (HOSPITAL_COMMUNITY): Payer: Self-pay | Admitting: Psychiatry

## 2022-11-06 ENCOUNTER — Other Ambulatory Visit (HOSPITAL_COMMUNITY): Payer: Self-pay

## 2022-11-06 VITALS — Wt 210.0 lb

## 2022-11-06 DIAGNOSIS — F1994 Other psychoactive substance use, unspecified with psychoactive substance-induced mood disorder: Secondary | ICD-10-CM

## 2022-11-06 DIAGNOSIS — F1990 Other psychoactive substance use, unspecified, uncomplicated: Secondary | ICD-10-CM

## 2022-11-06 DIAGNOSIS — F902 Attention-deficit hyperactivity disorder, combined type: Secondary | ICD-10-CM

## 2022-11-06 DIAGNOSIS — F1120 Opioid dependence, uncomplicated: Secondary | ICD-10-CM | POA: Diagnosis not present

## 2022-11-06 MED ORDER — QUETIAPINE FUMARATE 200 MG PO TABS
200.0000 mg | ORAL_TABLET | Freq: Every day | ORAL | 0 refills | Status: DC
  Filled 2022-11-06: qty 30, 30d supply, fill #0

## 2022-11-06 MED ORDER — DULOXETINE HCL 30 MG PO CPEP
30.0000 mg | ORAL_CAPSULE | Freq: Two times a day (BID) | ORAL | 0 refills | Status: DC
  Filled 2022-11-06: qty 60, 30d supply, fill #0
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
## 2022-11-07 ENCOUNTER — Encounter: Payer: Self-pay | Admitting: Surgery

## 2022-11-07 ENCOUNTER — Ambulatory Visit (INDEPENDENT_AMBULATORY_CARE_PROVIDER_SITE_OTHER): Payer: Medicare Other | Admitting: Surgery

## 2022-11-07 VITALS — BP 138/81 | HR 84 | Resp 20 | Ht 71.0 in | Wt 210.0 lb

## 2022-11-07 DIAGNOSIS — I712 Thoracic aortic aneurysm, without rupture, unspecified: Secondary | ICD-10-CM

## 2022-11-07 NOTE — Progress Notes (Signed)
HPI:  [name] is a 57 year old male with a past medical history of necrotizing myopathy, necrotizing pneumonia, and HTN who presents to the clinic for aortic aneurysm surveillance.  He is followed by cardiology who performed a CT coronary calcium score and CTA on 04/26/22. CT calcium score was 271 placing him in the 92nd percentile for his age and sex. CTA showed RUL opacities that are nodular like measuring up to 9mm as well as an ascending thoracic aortic aneurysm measuring up to 4.7cm. It was measured at 4.4 cm on CTA in 04/2021. Echocardiogram on 12/25/21 shows a tricuspid aortic valve. He continues to feel well overall. He denies any chest pain or back pain. His wife is with him today who is a Engineer, civil (consulting) at [employer].  Current Outpatient Medications  Medication Sig Dispense Refill   albuterol (VENTOLIN HFA) 108 (90 Base) MCG/ACT inhaler Inhale 2 puffs into the lungs every 6 (six) hours as needed for wheezing or shortness of breath. 6.7 g 2   Buprenorphine HCl-Naloxone HCl 8-2 MG FILM Place 1 Film under the tongue in the morning, at noon, and at bedtime. 90 each 0   chlorthalidone (HYGROTON) 25 MG tablet Take 1 tablet (25 mg total) by mouth daily. 90 tablet 3   DULoxetine (CYMBALTA) 30 MG capsule Take 1 capsule (30 mg total) by mouth 2 (two) times daily. 60 capsule 0   gabapentin (NEURONTIN) 300 MG capsule Take 3 capsules (900 mg total) by mouth 3 (three) times daily for Neuropathy 270 capsule 1   loratadine (CLARITIN) 10 MG tablet Take 1 tablet (10 mg total) by mouth daily. 15 tablet 0   metoprolol succinate (TOPROL-XL) 50 MG 24 hr tablet Take 1 tablet (50 mg total) by mouth daily. Take with or immediately following a meal. 90 tablet 3   nystatin (MYCOSTATIN) 100000 UNIT/ML suspension Take 5 mL (1 teaspoon) by mouth - swish and spit - four times a day until thrush resolves 90 mL 1   QUEtiapine (SEROQUEL) 200 MG tablet Take 1 tablet (200 mg total) by mouth at bedtime. 30 tablet 0    sildenafil (VIAGRA) 25 MG tablet Take 1 tablet (25 mg total) by mouth as needed for erectile dysfunction. 20 tablet 1   Testosterone 12.5 MG/ACT (1%) GEL Place 12.5 mg onto the skin daily. 75 g 0   No current facility-administered medications for this visit.     Physical Exam: BP 138/81 (BP Location: Left Arm, Patient Position: Sitting)   Pulse 84   Resp 20   Ht 5\' 11"  (1.803 m)   Wt 210 lb (95.3 kg)   SpO2 95% Comment: RA  BMI 29.29 kg/m  He looks well Cardiac exam shows a regular rate and rhythm with normal heart sounds and no murmur Lungs are clear  Diagnostic Tests:  Narrative & Impression  CLINICAL DATA:  57 year old male with history of pulmonary nodule. Followup study.   EXAM: CT CHEST WITHOUT CONTRAST   TECHNIQUE: Multidetector CT imaging of the chest was performed following the standard protocol without IV contrast.   RADIATION DOSE REDUCTION: This exam was performed according to the departmental dose-optimization program which includes automated exposure control, adjustment of the mA and/or kV according to patient size and/or use of iterative reconstruction technique.   COMPARISON:  Chest CTA 04/26/2022.   FINDINGS: Cardiovascular: Heart size is normal. There is no significant pericardial fluid, thickening or pericardial calcification. There is aortic atherosclerosis, as well as atherosclerosis of the great vessels of  the mediastinum and the coronary arteries, including calcified atherosclerotic plaque in the left main, left anterior descending and right coronary arteries. Calcifications of the aortic valve. Mild aneurysmal dilatation of the ascending thoracic aorta (4.5 cm in diameter).   Mediastinum/Nodes: No pathologically enlarged mediastinal or hilar lymph nodes. Please note that accurate exclusion of hilar adenopathy is limited on noncontrast CT scans. Esophagus is unremarkable in appearance. No axillary lymphadenopathy.   Lungs/Pleura:  Previously noted focal area of ground-glass attenuation in the posterior aspect of the right upper lobe has completely resolved, indicative of a benign infectious or inflammatory process on the prior study. No new suspicious appearing pulmonary nodules or masses are noted. No acute consolidative airspace disease. No pleural effusions. However, there are a few patchy areas of mild ground-glass attenuation, septal thickening and architectural distortion which are evident in the lung bases bilaterally (right greater than left), similar compared to the most recent prior study, favored to reflect areas of chronic post infectious or inflammatory scarring, particularly in light of the pneumonia throughout these regions demonstrated on remote prior CT examination 09/13/2020.   Upper Abdomen: Unremarkable.   Musculoskeletal: There are no aggressive appearing lytic or blastic lesions noted in the visualized portions of the skeleton.   IMPRESSION: 1. Resolution of previously noted nodular area of ground-glass attenuation in the right upper lobe compared to the prior study, indicative of a benign infectious or inflammatory process on the prior study. 2. Scattered areas of mild scarring throughout the lung bases bilaterally (right-greater-than-left), presumably sequela of multilobar pneumonia demonstrated on remote prior chest CT 09/13/2020. 3. Aortic atherosclerosis, in addition to left main and 2 vessel coronary artery disease. Please note that although the presence of coronary artery calcium documents the presence of coronary artery disease, the severity of this disease and any potential stenosis cannot be assessed on this non-gated CT examination. Assessment for potential risk factor modification, dietary therapy or pharmacologic therapy may be warranted, if clinically indicated. 4. There are calcifications of the aortic valve. Echocardiographic correlation for evaluation of potential  valvular dysfunction may be warranted if clinically indicated. 5. Mild aneurysmal dilatation of the ascending thoracic aorta (4.5 cm in diameter). Recommend semi-annual imaging followup by CTA or MRA and referral to cardiothoracic surgery if not already obtained. This recommendation follows 2010 ACCF/AHA/AATS/ACR/ASA/SCA/SCAI/SIR/STS/SVM Guidelines for the Diagnosis and Management of Patients With Thoracic Aortic Disease. Circulation. 2010; 121: J191-Y782. Aortic aneurysm NOS (ICD10-I71.9)   Aortic Atherosclerosis (ICD10-I70.0). Aortic aneurysm NOS (ICD10-I71.9).     Electronically Signed   By: Trudie Reed M.D.   On: 10/14/2022 10:39      Impression: He has a stable 4.5 cm fusiform ascending aortic aneurysm that was measured at 4.6 cm in October 2023 and 4.4 cm a year ago. It is probably unchanged. It is well below the surgical threshold of 5.5 cm. I reviewed the CTA images with the patient and his wife and answered their questions. I stressed the importance of good blood pressure control in preventing further enlargement of his aneurysm and acute aortic dissection. I cautioned him against any heavy lifting or strenuous physical activity that may require a valsalva maneuver and could suddenly raise his BP to high levels.  Plan:  I will see him back in one year with a CTA chest for aortic surveillance.  I spent 15 minutes performing this established patient evaluation and > 50% of this time was spent face to face counseling and coordinating the care of this patient's aortic aneurysm.    Judie Grieve  Jennefer Bravo, MD [facility] [phone]

## 2022-11-08 ENCOUNTER — Telehealth: Payer: Self-pay | Admitting: Cardiology

## 2022-11-08 NOTE — Telephone Encounter (Signed)
*  STAT* If patient is at the pharmacy, call can be transferred to refill team.   1. Which medications need to be refilled? (please list name of each medication and dose if known) amlodipine 5mg  tablet  2. Which pharmacy/location (including street and city if local pharmacy) is medication to be sent to? [place] [facility]   3. Do they need a 30 day or 90 day supply? 90 day

## 2022-11-12 ENCOUNTER — Other Ambulatory Visit (HOSPITAL_COMMUNITY): Payer: Self-pay

## 2022-11-12 ENCOUNTER — Other Ambulatory Visit: Payer: Self-pay

## 2022-11-13 ENCOUNTER — Other Ambulatory Visit (HOSPITAL_COMMUNITY): Payer: Self-pay

## 2022-11-13 MED ORDER — TESTOSTERONE 12.5 MG/ACT (1%) TD GEL
12.5000 mg | Freq: Every day | TRANSDERMAL | 0 refills | Status: DC
  Filled 2022-11-13: qty 150, 30d supply, fill #0

## 2022-11-13 NOTE — Telephone Encounter (Signed)
Called patient to discuss-he states he has been taking amlodipine 5 mg at night, unsure when this was started.  Reports he saw surgeon and told him he needed to continue this medication.    Will send to MD to review.

## 2022-11-14 ENCOUNTER — Telehealth: Payer: Self-pay | Admitting: *Deleted

## 2022-11-14 ENCOUNTER — Encounter: Payer: Self-pay | Admitting: Student in an Organized Health Care Education/Training Program

## 2022-11-14 ENCOUNTER — Other Ambulatory Visit (HOSPITAL_COMMUNITY): Payer: Self-pay

## 2022-11-14 MED ORDER — AMLODIPINE BESYLATE 5 MG PO TABS
5.0000 mg | ORAL_TABLET | Freq: Every day | ORAL | 2 refills | Status: DC
  Filled 2022-11-14: qty 90, 90d supply, fill #0
  Filled 2022-11-27 – 2023-01-29 (×2): qty 90, 90d supply, fill #1
  Filled 2023-08-15: qty 90, 90d supply, fill #2

## 2022-11-14 MED ORDER — TESTOSTERONE 12.5 MG/ACT (1%) TD GEL
50.0000 mg | Freq: Every day | TRANSDERMAL | 0 refills | Status: DC
  Filled 2022-11-14: qty 150, 30d supply, fill #0

## 2022-11-14 NOTE — Telephone Encounter (Signed)
Patient aware and verbalized understanding.  Rx sent to pharmacy. ?

## 2022-11-14 NOTE — Telephone Encounter (Signed)
Thank you. New rx for 50mg  daily sent.

## 2022-11-14 NOTE — Telephone Encounter (Signed)
OK to refill.  Would check BP twice daily for next week and send results

## 2022-11-14 NOTE — Telephone Encounter (Signed)
Patient called in stating Tim at [facility] states dose for testosterone gel is one fourth the dose he was getting. He is requesting new Rx for increased dose be sent to [facility].

## 2022-11-16 ENCOUNTER — Other Ambulatory Visit (HOSPITAL_COMMUNITY): Payer: Self-pay

## 2022-11-27 ENCOUNTER — Other Ambulatory Visit: Payer: Self-pay

## 2022-11-27 ENCOUNTER — Other Ambulatory Visit (HOSPITAL_COMMUNITY): Payer: Self-pay | Admitting: Psychiatry

## 2022-11-27 ENCOUNTER — Other Ambulatory Visit: Payer: Self-pay | Admitting: Student in an Organized Health Care Education/Training Program

## 2022-11-27 ENCOUNTER — Telehealth (HOSPITAL_BASED_OUTPATIENT_CLINIC_OR_DEPARTMENT_OTHER): Payer: Medicare Other | Admitting: Psychiatry

## 2022-11-27 ENCOUNTER — Encounter (HOSPITAL_COMMUNITY): Payer: Self-pay | Admitting: Psychiatry

## 2022-11-27 ENCOUNTER — Other Ambulatory Visit (HOSPITAL_COMMUNITY): Payer: Self-pay

## 2022-11-27 VITALS — Wt 210.0 lb

## 2022-11-27 DIAGNOSIS — F1994 Other psychoactive substance use, unspecified with psychoactive substance-induced mood disorder: Secondary | ICD-10-CM

## 2022-11-27 DIAGNOSIS — F902 Attention-deficit hyperactivity disorder, combined type: Secondary | ICD-10-CM

## 2022-11-27 DIAGNOSIS — F1990 Other psychoactive substance use, unspecified, uncomplicated: Secondary | ICD-10-CM | POA: Diagnosis not present

## 2022-11-27 DIAGNOSIS — F1120 Opioid dependence, uncomplicated: Secondary | ICD-10-CM

## 2022-11-27 DIAGNOSIS — F1190 Opioid use, unspecified, uncomplicated: Secondary | ICD-10-CM

## 2022-11-27 MED ORDER — DULOXETINE HCL 30 MG PO CPEP
30.0000 mg | ORAL_CAPSULE | Freq: Two times a day (BID) | ORAL | 1 refills | Status: DC
Start: 2022-11-27 — End: 2023-01-31
  Filled 2022-11-27 – 2022-12-06 (×2): qty 60, 30d supply, fill #0
  Filled 2023-01-09: qty 60, 30d supply, fill #1

## 2022-11-27 MED ORDER — QUETIAPINE FUMARATE 300 MG PO TABS
300.0000 mg | ORAL_TABLET | Freq: Every day | ORAL | 1 refills | Status: DC
Start: 2022-11-27 — End: 2023-01-31
  Filled 2022-11-27: qty 30, 30d supply, fill #0
  Filled 2022-12-28: qty 30, 30d supply, fill #1

## 2022-11-27 NOTE — Progress Notes (Signed)
[facility] MD Virtual Progress Note   Patient Location: Home Provider Location: Home Office  I connect with patient by video and verified that I am speaking with correct person by using two identifiers. I discussed the limitations of evaluation and management by telemedicine and the availability of in person appointments. I also discussed with the patient that there may be a patient responsible charge related to this service. The patient expressed understanding and agreed to proceed.  Joseph Ayala [id number] 57 y.o.  11/27/2022 11:07 AM  History of Present Illness:  Patient is 57 year old Caucasian, married man who was seen first time 4 weeks ago as initial evaluation.  He was referred from primary care physician for his psychiatric symptoms.  Patient has long history of substance use with multiple rehab.  He was at crisis center in March 2024 and then he did rehab in [place].  He was discharged on Seroquel, Suboxone and Cymbalta.  However his Seroquel and Cymbalta was not given by primary care.  We optimize the dose and he is taking Seroquel 200 mg and we started Cymbalta 30 mg 2 times a day.  He is getting Suboxone from his primary care.  He reported increased Seroquel does help his sleep and mood.  Denies any paranoia, hallucination, mania or any psychosis.  He still struggle with attention, concentration and multitasking.  He has a distant history of ADHD but never had psychological testing.  He is proud that his been sober for drugs for 59 days.  He does go to AA every day and he also involved in church activities.  He had a good support from his wife who is Charity fundraiser at [employer] system.  He does walk every day and he had 10 dogs and he take care of them.  He is happy his 65 year old daughter and son-in-law coming in July for a few weeks.  He understands that he need to remain sober from drugs to get better.  He denies any crying spells, suicidal thoughts, agitation, anger.  He  does get sometimes irritable when he is driving but no aggression, violence.  He is getting along with his family members.  We have recommended neuropsych testing to rule out ADHD but he forgot to get the referral.  He is not interested in individual therapy but like to continue his AA and church activities.  He has no tremors, shakes or any EPS.  His main goal is to remain sober from drugs.  He noticed much improvement with the increase Seroquel.  He does exercise and his appetite is okay and his weight is stable.   Past Psychiatric History: Reported using drugs since early age. As per EMR he has used marijuana, cocaine, alcohol, meth, opiates and at least 15 rehab. In this year he had at least 3 rehabilitation. Recall few days [facility] [place] for a few days and then he was found himself at [facility] ER after found confused. He then had evaluation at [facility] health facility based crisis in the last year was sent to [facility] for rehab. History of suicidal thoughts, hallucination, paranoia, confusion while using drugs or coming off from withdrawals. Multiple ER visit at [facility] health system. No history of IV drug use. Maximum sobriety for 6 years from 2012-2018. As per EMR took mirtazapine, olanzapine, trazodone but do not remember the details.    Outpatient Encounter Medications as of 11/27/2022  Medication Sig   albuterol (VENTOLIN HFA) 108 (90 Base) MCG/ACT  inhaler Inhale 2 puffs into the lungs every 6 (six) hours as needed for wheezing or shortness of breath.   amLODipine (NORVASC) 5 MG tablet Take 1 tablet (5 mg total) by mouth daily.   Buprenorphine HCl-Naloxone HCl 8-2 MG FILM Place 1 Film under the tongue in the morning, at noon, and at bedtime.   chlorthalidone (HYGROTON) 25 MG tablet Take 1 tablet (25 mg total) by mouth daily.   DULoxetine (CYMBALTA) 30 MG capsule Take 1 capsule (30 mg total) by mouth 2 (two) times daily.   gabapentin (NEURONTIN) 300 MG capsule Take  3 capsules (900 mg total) by mouth 3 (three) times daily for Neuropathy   loratadine (CLARITIN) 10 MG tablet Take 1 tablet (10 mg total) by mouth daily.   metoprolol succinate (TOPROL-XL) 50 MG 24 hr tablet Take 1 tablet (50 mg total) by mouth daily. Take with or immediately following a meal.   nystatin (MYCOSTATIN) 100000 UNIT/ML suspension Take 5 mL (1 teaspoon) by mouth - swish and spit - four times a day until thrush resolves   QUEtiapine (SEROQUEL) 200 MG tablet Take 1 tablet (200 mg total) by mouth at bedtime.   sildenafil (VIAGRA) 25 MG tablet Take 1 tablet (25 mg total) by mouth as needed for erectile dysfunction.   Testosterone 12.5 MG/ACT (1%) GEL Place 4 PUMPS onto the skin daily.   No facility-administered encounter medications on file as of 11/27/2022.    Recent Results (from the past 2160 hour(s))  CBC with Differential/Platelet     Status: None   Collection Time: 09/25/22  5:58 PM  Result Value Ref Range   WBC 7.0 4.0 - 10.5 K/uL   RBC 5.03 4.22 - 5.81 MIL/uL   Hemoglobin 15.3 13.0 - 17.0 g/dL   HCT 09.8 11.9 - 14.7 %   MCV 88.3 80.0 - 100.0 fL   MCH 30.4 26.0 - 34.0 pg   MCHC 34.5 30.0 - 36.0 g/dL   RDW 82.9 56.2 - 13.0 %   Platelets 213 150 - 400 K/uL   nRBC 0.0 0.0 - 0.2 %   Neutrophils Relative % 59 %   Neutro Abs 4.2 1.7 - 7.7 K/uL   Lymphocytes Relative 25 %   Lymphs Abs 1.7 0.7 - 4.0 K/uL   Monocytes Relative 8 %   Monocytes Absolute 0.6 0.1 - 1.0 K/uL   Eosinophils Relative 6 %   Eosinophils Absolute 0.4 0.0 - 0.5 K/uL   Basophils Relative 1 %   Basophils Absolute 0.1 0.0 - 0.1 K/uL   Immature Granulocytes 1 %   Abs Immature Granulocytes 0.05 0.00 - 0.07 K/uL    Comment: Performed at [facility], [address] [place], [place] [address]  Comprehensive metabolic panel     Status: Abnormal   Collection Time: 09/25/22  5:58 PM  Result Value Ref Range   Sodium 137 135 - 145 mmol/L   Potassium 3.9 3.5 - 5.1 mmol/L   Chloride 97 (L) 98 - 111 mmol/L    CO2 28 22 - 32 mmol/L   Glucose, Bld 69 (L) 70 - 99 mg/dL    Comment: Glucose reference range applies only to samples taken after fasting for at least 8 hours.   BUN 18 6 - 20 mg/dL   Creatinine, Ser 4.69 (L) 0.61 - 1.24 mg/dL   Calcium 62.9 8.9 - 52.8 mg/dL   Total Protein 6.7 6.5 - 8.1 g/dL   Albumin 4.2 3.5 - 5.0 g/dL   AST 46 (H)  15 - 41 U/L   ALT 66 (H) 0 - 44 U/L   Alkaline Phosphatase 69 38 - 126 U/L   Total Bilirubin 0.2 (L) 0.3 - 1.2 mg/dL   GFR, Estimated >16 >10 mL/min    Comment: (NOTE) Calculated using the CKD-EPI Creatinine Equation (2021)    Anion gap 12 5 - 15    Comment: Performed at [facility], [address] [place], [place] [address]  Hemoglobin A1c     Status: None   Collection Time: 09/25/22  5:58 PM  Result Value Ref Range   Hgb A1c MFr Bld 5.6 4.8 - 5.6 %    Comment: (NOTE)         Prediabetes: 5.7 - 6.4         Diabetes: >6.4         Glycemic control for adults with diabetes: <7.0    Mean Plasma Glucose 114 mg/dL    Comment: (NOTE) Performed At: [facility] [address] [place], [place] [id number] Jolene Schimke MD NW:[id number]   Magnesium     Status: None   Collection Time: 09/25/22  5:58 PM  Result Value Ref Range   Magnesium 2.0 1.7 - 2.4 mg/dL    Comment: Performed at [facility], [address], [place], [place] [address]  Ethanol     Status: None   Collection Time: 09/25/22  5:58 PM  Result Value Ref Range   Alcohol, Ethyl (B) <10 <10 mg/dL    Comment: (NOTE) Lowest detectable limit for serum alcohol is 10 mg/dL.  For medical purposes only. Performed at [facility], [address] [place], [place] [address]   Lipid panel     Status: Abnormal   Collection Time: 09/25/22  5:58 PM  Result Value Ref Range   Cholesterol 212 (H) 0 - 200 mg/dL   Triglycerides 284 (H) <150 mg/dL   HDL 45 >13 mg/dL   Total CHOL/HDL Ratio 4.7 RATIO   VLDL 75 (H) 0 - 40 mg/dL   LDL Cholesterol 92 0 - 99 mg/dL     Comment:        Total Cholesterol/HDL:CHD Risk Coronary Heart Disease Risk Table                     Men   Women  1/2 Average Risk   3.4   3.3  Average Risk       5.0   4.4  2 X Average Risk   9.6   7.1  3 X Average Risk  23.4   11.0        Use the calculated Patient Ratio above and the CHD Risk Table to determine the patient's CHD Risk.        ATP III CLASSIFICATION (LDL):  <100     mg/dL   Optimal  244-010  mg/dL   Near or Above                    Optimal  130-159  mg/dL   Borderline  272-536  mg/dL   High  >644     mg/dL   Very High Performed at [facility], [address], [place], [place] [address]   TSH     Status: None   Collection Time: 09/25/22  5:58 PM  Result Value Ref Range   TSH 0.602 0.350 - 4.500 uIU/mL    Comment: Performed by a 3rd Generation assay with a  functional sensitivity of <=0.01 uIU/mL. Performed at [facility], [address], [place], [place] [address]   Prolactin     Status: None   Collection Time: 09/25/22  5:58 PM  Result Value Ref Range   Prolactin 4.6 3.6 - 25.2 ng/mL    Comment: (NOTE) Performed At: [facility] [address] [place], [place] [id number] Jolene Schimke MD OZ:[id number]   Resp panel by RT-PCR (RSV, Flu A&B, Covid) Anterior Nasal Swab     Status: None   Collection Time: 09/25/22  6:30 PM   Specimen: Anterior Nasal Swab  Result Value Ref Range   SARS Coronavirus 2 by RT PCR NEGATIVE NEGATIVE   Influenza A by PCR NEGATIVE NEGATIVE   Influenza B by PCR NEGATIVE NEGATIVE    Comment: (NOTE) The Xpert Xpress SARS-CoV-2/FLU/RSV plus assay is intended as an aid in the diagnosis of influenza from Nasopharyngeal swab specimens and should not be used as a sole basis for treatment. Nasal washings and aspirates are unacceptable for Xpert Xpress SARS-CoV-2/FLU/RSV testing.  Fact Sheet for Patients: BloggerCourse.com  Fact Sheet for Healthcare  Providers: SeriousBroker.it  This test is not yet approved or cleared by the [place] FDA and has been authorized for detection and/or diagnosis of SARS-CoV-2 by FDA under an Emergency Use Authorization (EUA). This EUA will remain in effect (meaning this test can be used) for the duration of the COVID-19 declaration under Section 564(b)(1) of the Act, 21 U.S.C. section 360bbb-3(b)(1), unless the authorization is terminated or revoked.     Resp Syncytial Virus by PCR NEGATIVE NEGATIVE    Comment: (NOTE) Fact Sheet for Patients: BloggerCourse.com  Fact Sheet for Healthcare Providers: SeriousBroker.it  This test is not yet approved or cleared by the [place] FDA and has been authorized for detection and/or diagnosis of SARS-CoV-2 by FDA under an Emergency Use Authorization (EUA). This EUA will remain in effect (meaning this test can be used) for the duration of the COVID-19 declaration under Section 564(b)(1) of the Act, 21 U.S.C. section 360bbb-3(b)(1), unless the authorization is terminated or revoked.  Performed at [facility], [address], [place], [place] [address]   POC SARS Coronavirus 2 Ag     Status: None   Collection Time: 09/25/22  6:46 PM  Result Value Ref Range   SARSCOV2ONAVIRUS 2 AG NEGATIVE NEGATIVE    Comment: (NOTE) SARS-CoV-2 antigen NOT DETECTED.   Negative results are presumptive.  Negative results do not preclude SARS-CoV-2 infection and should not be used as the sole basis for treatment or other patient management decisions, including infection  control decisions, particularly in the presence of clinical signs and  symptoms consistent with COVID-19, or in those who have been in contact with the virus.  Negative results must be combined with clinical observations, patient history, and epidemiological information. The expected result is Negative.  Fact Sheet  for Patients: https://www.jennings-kim.com/  Fact Sheet for Healthcare Providers: https://alexander-rogers.biz/  This test is not yet approved or cleared by the [place] FDA and  has been authorized for detection and/or diagnosis of SARS-CoV-2 by FDA under an Emergency Use Authorization (EUA).  This EUA will remain in effect (meaning this test can be used) for the duration of  the COV ID-19 declaration under Section 564(b)(1) of the Act, 21 U.S.C. section 360bbb-3(b)(1), unless the authorization is terminated or revoked sooner.    POCT Urine Drug Screen - (I-Screen)     Status: Abnormal   Collection Time: 09/25/22  7:43  PM  Result Value Ref Range   POC Amphetamine UR None Detected NONE DETECTED (Cut Off Level 1000 ng/mL)   POC Secobarbital (BAR) None Detected NONE DETECTED (Cut Off Level 300 ng/mL)   POC Buprenorphine (BUP) None Detected NONE DETECTED (Cut Off Level 10 ng/mL)   POC Oxazepam (BZO) None Detected NONE DETECTED (Cut Off Level 300 ng/mL)   POC Cocaine UR None Detected NONE DETECTED (Cut Off Level 300 ng/mL)   POC Methamphetamine UR None Detected NONE DETECTED (Cut Off Level 1000 ng/mL)   POC Morphine None Detected NONE DETECTED (Cut Off Level 300 ng/mL)   POC Methadone UR None Detected NONE DETECTED (Cut Off Level 300 ng/mL)   POC Oxycodone UR None Detected NONE DETECTED (Cut Off Level 100 ng/mL)   POC Marijuana UR Positive (A) NONE DETECTED (Cut Off Level 50 ng/mL)  Urinalysis, Routine w reflex microscopic -Urine, Clean Catch     Status: Abnormal   Collection Time: 09/25/22  9:45 PM  Result Value Ref Range   Color, Urine STRAW (A) YELLOW   APPearance CLEAR CLEAR   Specific Gravity, Urine 1.006 1.005 - 1.030   pH 7.0 5.0 - 8.0   Glucose, UA NEGATIVE NEGATIVE mg/dL   Hgb urine dipstick NEGATIVE NEGATIVE   Bilirubin Urine NEGATIVE NEGATIVE   Ketones, ur NEGATIVE NEGATIVE mg/dL   Protein, ur NEGATIVE NEGATIVE mg/dL   Nitrite NEGATIVE  NEGATIVE   Leukocytes,Ua NEGATIVE NEGATIVE    Comment: Performed at [facility], [address] [place], [place] [address]  Comprehensive metabolic panel     Status: Abnormal   Collection Time: 09/27/22  3:00 AM  Result Value Ref Range   Sodium 133 (L) 135 - 145 mmol/L   Potassium 3.6 3.5 - 5.1 mmol/L   Chloride 99 98 - 111 mmol/L   CO2 22 22 - 32 mmol/L   Glucose, Bld 124 (H) 70 - 99 mg/dL    Comment: Glucose reference range applies only to samples taken after fasting for at least 8 hours.   BUN 20 6 - 20 mg/dL   Creatinine, Ser 6.04 0.61 - 1.24 mg/dL   Calcium 54.0 (H) 8.9 - 10.3 mg/dL   Total Protein 7.7 6.5 - 8.1 g/dL   Albumin 4.8 3.5 - 5.0 g/dL   AST 34 15 - 41 U/L   ALT 64 (H) 0 - 44 U/L   Alkaline Phosphatase 82 38 - 126 U/L   Total Bilirubin 1.1 0.3 - 1.2 mg/dL   GFR, Estimated >98 >11 mL/min    Comment: (NOTE) Calculated using the CKD-EPI Creatinine Equation (2021)    Anion gap 12 5 - 15    Comment: Performed at [facility], [address], [place], [place] [address]  Ethanol     Status: None   Collection Time: 09/27/22  3:00 AM  Result Value Ref Range   Alcohol, Ethyl (B) <10 <10 mg/dL    Comment: (NOTE) Lowest detectable limit for serum alcohol is 10 mg/dL.  For medical purposes only. Performed at [facility], [address] [place], [place] [address]   cbc     Status: Abnormal   Collection Time: 09/27/22  3:00 AM  Result Value Ref Range   WBC 15.9 (H) 4.0 - 10.5 K/uL   RBC 5.75 4.22 - 5.81 MIL/uL   Hemoglobin 17.7 (H) 13.0 - 17.0 g/dL   HCT 13.0 86.5 - 78.4 %   MCV 84.2 80.0 - 100.0 fL   MCH 30.8  26.0 - 34.0 pg   MCHC 36.6 (H) 30.0 - 36.0 g/dL   RDW 57.8 46.9 - 62.9 %   Platelets 282 150 - 400 K/uL   nRBC 0.0 0.0 - 0.2 %    Comment: Performed at [facility], [address], [place], [place] [address]  ToxAssure Select,+Antidepr,UR     Status: None   Collection Time: 10/15/22  9:00 AM  Result Value Ref Range   Summary  Note     Comment: ==================================================================== ToxAssure Select,+Antidepr,UR ==================================================================== Test                             Result       Flag       Units  Drug Present and Declared for Prescription Verification   Buprenorphine                  138          EXPECTED   ng/mg creat   Norbuprenorphine               89           EXPECTED   ng/mg creat    Source of buprenorphine is a scheduled prescription medication.    Norbuprenorphine is an expected metabolite of buprenorphine.    Duloxetine                     PRESENT      EXPECTED  Drug Present not Declared for Prescription Verification   Carboxy-THC                    217          UNEXPECTED ng/mg creat    Carboxy-THC is a metabolite of tetrahydrocannabinol (THC). Source of    THC is most commonly herbal marijuana or marijuana-based products,    but THC is also present in a scheduled prescription medication.    Trace amounts of THC can be pre sent in hemp and cannabidiol (CBD)    products. This test is not intended to distinguish between delta-9-    tetrahydrocannabinol, the predominant form of THC in most herbal or    marijuana-based products, and delta-8-tetrahydrocannabinol.  ==================================================================== Test                      Result    Flag   Units      Ref Range   Creatinine              99               mg/dL      >=32 ==================================================================== Declared Medications:  The flagging and interpretation on this report are based on the  following declared medications.  Unexpected results may arise from  inaccuracies in the declared medications.   **Note: The testing scope of this panel includes these medications:   Duloxetine   **Note: The testing scope of this panel does not include small to  moderate amounts of these reported medications:    Buprenorphine   **Note: The testing scope of this panel does not include th e  following reported medications:   Gabapentin  Quetiapine (Seroquel) ==================================================================== For clinical consultation, please call [phone]. ====================================================================   CBC with Differential     Status: Abnormal   Collection Time: 10/20/22 11:20 AM  Result Value Ref Range   WBC 5.9 4.0 -  10.5 K/uL   RBC 4.48 4.22 - 5.81 MIL/uL   Hemoglobin 13.8 13.0 - 17.0 g/dL   HCT 16.1 09.6 - 04.5 %   MCV 92.6 80.0 - 100.0 fL   MCH 30.8 26.0 - 34.0 pg   MCHC 33.3 30.0 - 36.0 g/dL   RDW 40.9 (H) 81.1 - 91.4 %   Platelets 158 150 - 400 K/uL   nRBC 0.0 0.0 - 0.2 %   Neutrophils Relative % 68 %   Neutro Abs 4.0 1.7 - 7.7 K/uL   Lymphocytes Relative 19 %   Lymphs Abs 1.1 0.7 - 4.0 K/uL   Monocytes Relative 9 %   Monocytes Absolute 0.5 0.1 - 1.0 K/uL   Eosinophils Relative 2 %   Eosinophils Absolute 0.1 0.0 - 0.5 K/uL   Basophils Relative 1 %   Basophils Absolute 0.0 0.0 - 0.1 K/uL   Immature Granulocytes 1 %   Abs Immature Granulocytes 0.08 (H) 0.00 - 0.07 K/uL    Comment: Performed at [facility], [address] [place], [place] [address]  Comprehensive metabolic panel     Status: Abnormal   Collection Time: 10/20/22 11:20 AM  Result Value Ref Range   Sodium 138 135 - 145 mmol/L   Potassium 3.6 3.5 - 5.1 mmol/L   Chloride 101 98 - 111 mmol/L   CO2 30 22 - 32 mmol/L   Glucose, Bld 110 (H) 70 - 99 mg/dL    Comment: Glucose reference range applies only to samples taken after fasting for at least 8 hours.   BUN 19 6 - 20 mg/dL   Creatinine, Ser 6.21 0.61 - 1.24 mg/dL   Calcium 9.4 8.9 - 30.8 mg/dL   Total Protein 6.4 (L) 6.5 - 8.1 g/dL   Albumin 4.1 3.5 - 5.0 g/dL   AST 66 (H) 15 - 41 U/L   ALT 100 (H) 0 - 44 U/L   Alkaline Phosphatase 64 38 - 126 U/L   Total Bilirubin 0.4 0.3 - 1.2 mg/dL   GFR,  Estimated >65 >78 mL/min    Comment: (NOTE) Calculated using the CKD-EPI Creatinine Equation (2021)    Anion gap 7 5 - 15    Comment: Performed at [facility], [address] [place], [place] [address]  Acetaminophen level     Status: Abnormal   Collection Time: 10/20/22 12:18 PM  Result Value Ref Range   Acetaminophen (Tylenol), Serum <10 (L) 10 - 30 ug/mL    Comment: (NOTE) Therapeutic concentrations vary significantly. A range of 10-30 ug/mL  may be an effective concentration for many patients. However, some  are best treated at concentrations outside of this range. Acetaminophen concentrations >150 ug/mL at 4 hours after ingestion  and >50 ug/mL at 12 hours after ingestion are often associated with  toxic reactions.  Performed at [facility], [address], [place], [place] [address]      Psychiatric Specialty Exam: Physical Exam  Review of Systems  Neurological:  Positive for numbness.  Psychiatric/Behavioral:  Positive for decreased concentration and sleep disturbance.     Weight 210 lb (95.3 kg).There is no height or weight on file to calculate BMI.  General Appearance: Casual  Eye Contact:  Good  Speech:  Clear and Coherent  Volume:  Increased  Mood:  Euthymic  Affect:  Congruent  Thought Process:  Goal Directed  Orientation:  Full (Time, Place, and Person)  Thought Content:  Rumination  Suicidal Thoughts:  No  Homicidal Thoughts:  No  Memory:  Immediate;   Good Recent;   Fair Remote;   Fair  Judgement:  Intact  Insight:  Present  Psychomotor Activity:  Increased  Concentration:  Concentration: Fair and Attention Span: Fair  Recall:  [employer] of Knowledge:  Fair  Language:  Good  Akathisia:  No  Handed:  Right  AIMS (if indicated):     Assets:  Communication Skills Desire for Improvement Housing Resilience Social Support Transportation  ADL's:  Intact  Cognition:  WNL  Sleep:  better     Assessment/Plan: Substance  induced mood disorder (HCC) - Plan: DULoxetine (CYMBALTA) 30 MG capsule, QUEtiapine (SEROQUEL) 300 MG tablet  Attention deficit hyperactivity disorder (ADHD), combined type - Plan: DULoxetine (CYMBALTA) 30 MG capsule, QUEtiapine (SEROQUEL) 300 MG tablet  Polysubstance use disorder  Opioid use disorder, severe, on maintenance therapy, dependence (HCC)  Patient doing better with increased Seroquel.  He is sleeping better but is still not able to get 8 hours sleep.  He has some residual mood lability but no paranoia or hallucination violence.  I recommend to try Seroquel 300 mg which he agree on it.  Continue Cymbalta 30 mg 2 times a day.  I reviewed collateral information, he is getting Suboxone from other provider.  We will also refer him for neuropsych testing to rule out ADHD.  Struggle with attention, focus and multitasking.  I discussed that he need to remain free from drugs to get to his baseline is functioning that will help his focus attention and concentration.  He agreed with the plan.  Discussed medication side effects and benefits.  Encouraged to continue AA program and church activities.  Encourage healthy diet and regular exercise.  Follow up in 2 months.  Follow Up Instructions:     I discussed the assessment and treatment plan with the patient. The patient was provided an opportunity to ask questions and all were answered. The patient agreed with the plan and demonstrated an understanding of the instructions.   The patient was advised to call back or seek an in-person evaluation if the symptoms worsen or if the condition fails to improve as anticipated.    Collaboration of Care: Other provider involved in patient's care AEB notes are available in epic to review.  Patient/Guardian was advised Release of Information must be obtained prior to any record release in order to collaborate their care with an outside provider. Patient/Guardian was advised if they have not already done so to  contact the registration department to sign all necessary forms in order for [place] to release information regarding their care.   Consent: Patient/Guardian gives verbal consent for treatment and assignment of benefits for services provided during this visit. Patient/Guardian expressed understanding and agreed to proceed.     I provided 30 minutes of non face to face time during this encounter.  Note: This document was prepared by [employer] voice dictation technology and any errors that results from this process are unintentional.    Cleotis Nipper, MD 11/27/2022

## 2022-11-28 ENCOUNTER — Other Ambulatory Visit (HOSPITAL_COMMUNITY): Payer: Self-pay

## 2022-11-28 ENCOUNTER — Telehealth (HOSPITAL_COMMUNITY): Payer: Self-pay | Admitting: *Deleted

## 2022-11-28 MED ORDER — BUPRENORPHINE HCL-NALOXONE HCL 8-2 MG SL FILM
1.0000 | ORAL_FILM | Freq: Three times a day (TID) | SUBLINGUAL | 0 refills | Status: DC
Start: 2022-11-28 — End: 2023-01-02
  Filled 2022-11-28 – 2022-11-30 (×2): qty 90, 30d supply, fill #0

## 2022-11-28 NOTE — Telephone Encounter (Signed)
Referrals for ADHD testing sent out to [facility] and [employer].

## 2022-11-29 ENCOUNTER — Other Ambulatory Visit (HOSPITAL_COMMUNITY): Payer: Self-pay

## 2022-11-30 ENCOUNTER — Other Ambulatory Visit (HOSPITAL_COMMUNITY): Payer: Self-pay

## 2022-12-06 ENCOUNTER — Other Ambulatory Visit (HOSPITAL_COMMUNITY): Payer: Self-pay

## 2022-12-06 ENCOUNTER — Other Ambulatory Visit: Payer: Self-pay | Admitting: Student in an Organized Health Care Education/Training Program

## 2022-12-06 DIAGNOSIS — R7989 Other specified abnormal findings of blood chemistry: Secondary | ICD-10-CM

## 2022-12-07 ENCOUNTER — Other Ambulatory Visit (HOSPITAL_COMMUNITY): Payer: Self-pay

## 2022-12-07 MED ORDER — TESTOSTERONE 12.5 MG/ACT (1%) TD GEL
50.0000 mg | Freq: Every day | TRANSDERMAL | 0 refills | Status: DC
  Filled 2022-12-07: qty 150, 30d supply, fill #0

## 2022-12-12 ENCOUNTER — Encounter: Payer: Self-pay | Admitting: Student in an Organized Health Care Education/Training Program

## 2022-12-12 ENCOUNTER — Other Ambulatory Visit (HOSPITAL_COMMUNITY): Payer: Self-pay

## 2022-12-13 ENCOUNTER — Telehealth: Payer: Self-pay

## 2022-12-13 ENCOUNTER — Other Ambulatory Visit (HOSPITAL_COMMUNITY): Payer: Self-pay

## 2022-12-13 NOTE — Telephone Encounter (Signed)
Prior Authorization for patient (Testosterone) came through on cover my meds was submitted with last office notes awaiting approval or denial 

## 2022-12-13 NOTE — Telephone Encounter (Signed)
Decision:Approved Marshun Dyment (Key: [id number]) PA Case ID #: [id number] Rx #: [id number] Need Help? Call us at [phone] Outcome Approved today [id number]:[id number];LKGMWN:[id number];Review Type:Prior Auth;Coverage Start Date:11/13/2022;Coverage End Date:12/13/2023; Authorization Expiration Date: 12/12/2023 Drug Testosterone 12.5 MG/ACT(1%) gel ePA cloud logo Form [employer] Electronic PA Form (2017 NCPDP) Original Claim Info [id number] IF LEVEL OF CARE CHANGE CALL HELP DESK  Approval faxed to the pharmacy

## 2022-12-14 ENCOUNTER — Other Ambulatory Visit (HOSPITAL_COMMUNITY): Payer: Self-pay

## 2022-12-28 ENCOUNTER — Other Ambulatory Visit: Payer: Self-pay

## 2022-12-28 ENCOUNTER — Other Ambulatory Visit (HOSPITAL_COMMUNITY): Payer: Self-pay

## 2023-01-01 ENCOUNTER — Other Ambulatory Visit (HOSPITAL_COMMUNITY): Payer: Self-pay

## 2023-01-01 MED ORDER — GABAPENTIN 300 MG PO CAPS
900.0000 mg | ORAL_CAPSULE | Freq: Three times a day (TID) | ORAL | 1 refills | Status: DC
  Filled 2023-01-01: qty 270, 30d supply, fill #0
  Filled 2023-01-21 – 2023-04-01 (×5): qty 270, 30d supply, fill #1

## 2023-01-02 ENCOUNTER — Other Ambulatory Visit (HOSPITAL_COMMUNITY): Payer: Self-pay

## 2023-01-02 ENCOUNTER — Other Ambulatory Visit: Payer: Self-pay | Admitting: Student in an Organized Health Care Education/Training Program

## 2023-01-02 DIAGNOSIS — F1190 Opioid use, unspecified, uncomplicated: Secondary | ICD-10-CM

## 2023-01-03 ENCOUNTER — Other Ambulatory Visit (HOSPITAL_COMMUNITY): Payer: Self-pay

## 2023-01-03 MED ORDER — BUPRENORPHINE HCL-NALOXONE HCL 8-2 MG SL FILM
1.0000 | ORAL_FILM | Freq: Three times a day (TID) | SUBLINGUAL | 0 refills | Status: DC
Start: 2023-01-03 — End: 2023-01-28
  Filled 2023-01-03: qty 90, 30d supply, fill #0

## 2023-01-03 NOTE — Telephone Encounter (Signed)
Ok. Please reach out to him and set up an appointment with me sometime in the next one month. OK to overbook if needed. Thank you.

## 2023-01-09 ENCOUNTER — Other Ambulatory Visit (HOSPITAL_COMMUNITY): Payer: Self-pay

## 2023-01-09 ENCOUNTER — Other Ambulatory Visit: Payer: Self-pay | Admitting: Student in an Organized Health Care Education/Training Program

## 2023-01-09 MED ORDER — TESTOSTERONE 12.5 MG/ACT (1%) TD GEL
50.0000 mg | Freq: Every day | TRANSDERMAL | 0 refills | Status: DC
  Filled 2023-01-09: qty 150, 30d supply, fill #0

## 2023-01-18 NOTE — Progress Notes (Signed)
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  Respiratory cultures grew MRSA.  Hospital course was complicated by atrial fibrillation, which is a new diagnosis.  Echocardiogram on 2/22 showed EF 35 to 40%, mild RV dysfunction.  He was started on amiodarone and converted to normal sinus rhythm on 2/22.  He required chest tube placement for right pleural effusion.    He was admitted from 2/19 through 09/29/2020.  He required tracheostomy but was removed on 3/22 he was discharged on oral linezolid and MRSA pneumonia.  He was discharged on metoprolol, Lasix, and lisinopril.  TEE on 09/16/2020 showed no evidence of endocarditis.  Likely small PFO.  EF 45 to 50%, low normal RV function.  Also noted on CT to have 4.6 cm thoracic aortic aneurysm.  Cardiac monitor x2 weeks showed no significant arrhythmias on 12/20/2020.    Echocardiogram 12/25/2021 showed EF 60 to 65%, normal RV function, no significant valvular disease, ascending aortic dilatation measuring 43 mm.  Coronary CTA on 04/26/2022 showed nonobstructive CAD, calcium score 271 (92nd percentile), dilated ascending aorta measuring 44 mm, right upper lobe pulmonary nodule measuring 1.6 cm   Past Medical History    Past Medical History:  Diagnosis Date   Acute HFmrEF (heart failure with mildly reduced ejection fraction) (HCC) 10/31/2020   Acute hypoxemic respiratory failure (HCC)    Acute respiratory failure with hypoxia (HCC) 08/28/2020   CAP (community acquired pneumonia) 08/28/2020   Degenerative disc disease, lumbar    Dental crown present     ETOH abuse    History of kidney stones    Hypertension    states under control with meds., has been on med. x 1 yr. - is currently out of med., to see PCP 02/06/2018   Marijuana use, continuous    MRSA pneumonia (HCC) 09/09/2020   Muscle atrophy 01/2018   Muscle weakness 01/2018   Necrotizing myopathy    Necrotizing pneumonia (HCC) 09/09/2020   Neuropathic pain    Opioid use disorder    PAF (paroxysmal atrial fibrillation) (HCC)    Pleural effusion    Sepsis (HCC) 08/28/2020   Thoracic aortic aneurysm (TAA) (HCC)    a. seen on CT 08/2020.   Past Surgical History:  Procedure Laterality Date   ENDOVENOUS ABLATION SAPHENOUS VEIN W/ LASER Right 12-29-2015   endovenous laser ablation right greater saphenous vein, stab phlebectomy > 20 incisions right leg, sclerotherapy right leg by Gretta Began MD     MINOR MUSCLE BIOPSY Right 02/11/2018   Procedure: RECTUS FEMORIS MUSCLE BIOPSY;  Surgeon: Darnell Level, MD;  Location: [facility];  Service: General;  Laterality: Right;   MUSCLE BIOPSY Right 02/11/2018   Procedure: DELTOID MUSCLE BIOPSY;  Surgeon: Darnell Level, MD;  Location: [facility];  Service: General;  Laterality: Right;   NASAL FRACTURE SURGERY     TRACHEOSTOMY TUBE PLACEMENT N/A 09/14/2020   Procedure: TRACHEOSTOMY;  Surgeon: Suzanna Obey, MD;  Location: [facility];  Service: ENT;  Laterality: N/A;    Allergies  Allergies  Allergen Reactions   Ivp Dye [Iodinated Contrast Media] Hives   Lyrica  Cr [Pregabalin Er] Other (See Comments)    Memory loss   Vicodin [Hydrocodone-Acetaminophen] Nausea And Vomiting   Quinolones Other (See Comments)    Aortic Aneurysm    History of Present Illness    Joseph Ayala comes today for ongoing assessment of hypertension, thoracic aortic aneurysm, CAD , Coronary CTA calcium 271, LAD 1-24% proximal stenosis, otherwise normal coronaries.  Joseph Ayala has been sober for 4 months, did go to a rehabilitation clinic and is being  followed by behavioral health through [facility] and psychiatry.  He also goes to [employer]  He states that he has not had any more recurrent chest pain, he denies any shortness of breath.  He is having trouble breathing and his psychiatrist placed him on Seroquel which is helping.  Unfortunately he does continue to smoke marijuana.  He is very hyperactive.  He is medically compliant.  He has had a recent CT scan of the chest on 10/11/2022 due to pulmonary nodule.  His aneurysm of his ascending thoracic aorta was measured at 4.5 cm in diameter.  Home Medications    Current Outpatient Medications  Medication Sig Dispense Refill   albuterol (VENTOLIN HFA) 108 (90 Base) MCG/ACT inhaler Inhale 2 puffs into the lungs every 6 (six) hours as needed for wheezing or shortness of breath. 6.7 g 2   amLODipine (NORVASC) 5 MG tablet Take 1 tablet (5 mg total) by mouth daily. 90 tablet 2   Buprenorphine HCl-Naloxone HCl 8-2 MG FILM Place 1 Film under the tongue in the morning, at noon, and at bedtime. 90 each 0   chlorthalidone (HYGROTON) 25 MG tablet Take 1 tablet (25 mg total) by mouth daily. 90 tablet 3   DULoxetine (CYMBALTA) 30 MG capsule Take 1 capsule (30 mg total) by mouth 2 (two) times daily. 60 capsule 1   gabapentin (NEURONTIN) 300 MG capsule Take 3 capsules (900 mg total) by mouth 3 (three) times daily for Neuropathy 270 capsule 1   metoprolol succinate (TOPROL-XL) 50 MG 24 hr tablet Take 1 tablet (50 mg total) by mouth daily. Take with or immediately following a meal. 90 tablet 3   QUEtiapine (SEROQUEL) 300 MG tablet Take 1 tablet (300 mg total) by mouth at bedtime. 30 tablet 1   Testosterone 12.5 MG/ACT (1%) GEL Place 4 PUMPS onto the skin daily. 150 g 0   loratadine (CLARITIN) 10 MG tablet Take 1 tablet (10 mg total) by mouth daily. 15 tablet 0   nystatin (MYCOSTATIN) 100000 UNIT/ML suspension Take 5 mL (1 teaspoon) by mouth - swish and spit - four times a day until thrush resolves 90 mL 1   sildenafil  (VIAGRA) 25 MG tablet Take 1 tablet (25 mg total) by mouth as needed for erectile dysfunction. (Patient not taking: Reported on 01/21/2023) 20 tablet 1   No current facility-administered medications for this visit.     Family History    Family History  Problem Relation Age of Onset   Varicose Veins Brother    Drug abuse Brother    He indicated that his mother is alive. He indicated that his father is alive. He indicated that both of his brothers are alive.  Social History    Social History   Socioeconomic History   Marital status: Married    Spouse name: Not on file   Number of children: 1   Years of education: 13   Highest education level: Some college, no degree  Occupational History   Occupation: not working  Tobacco  Use   Smoking status: Never   Smokeless tobacco: Former  Building services engineer status: Never Used  Substance and Sexual Activity   Alcohol use: Not Currently    Comment: occasionally   Drug use: No   Sexual activity: Yes    Partners: Female    Birth control/protection: None  Other Topics Concern   Not on file  Social History Narrative   Lives with wife and daughter in a one story home.  Has one child.  Currently not working.  Education: some college.    Social Determinants of Health   Financial Resource Strain: Low Risk  (03/19/2022)   Overall Financial Resource Strain (CARDIA)    Difficulty of Paying Living Expenses: Not hard at all  Food Insecurity: No Food Insecurity (03/19/2022)   Hunger Vital Sign    Worried About Running Out of Food in the Last Year: Never true    Ran Out of Food in the Last Year: Never true  Transportation Needs: No Transportation Needs (03/27/2022)   PRAPARE - Administrator, Civil Service (Medical): No    Lack of Transportation (Non-Medical): No  Physical Activity: Inactive (03/19/2022)   Exercise Vital Sign    Days of Exercise per Week: 0 days    Minutes of Exercise per Session: 0 min  Stress: No Stress Concern  Present (03/19/2022)   [employer] of Occupational Health - Occupational Stress Questionnaire    Feeling of Stress : Not at all  Social Connections: Unknown (09/05/2022)   [facility]   Social Network    Social Network: Not on file  Intimate Partner Violence: Unknown (09/05/2022)   Received from [facility]   HITS    Physically Hurt: Not on file    Insult or Talk Down To: Not on file    Threaten Physical Harm: Not on file    Scream or Curse: Not on file     Review of Systems    General:  No chills, fever, night sweats or weight changes.  Complains of insomnia, and jittery.  He admits to being hyperactive. Cardiovascular:  No chest pain, dyspnea on exertion, edema, orthopnea, palpitations, paroxysmal nocturnal dyspnea. Dermatological: No rash, lesions/masses Respiratory: No cough, dyspnea Urologic: No hematuria, dysuria Abdominal:   No nausea, vomiting, diarrhea, bright red blood per rectum, melena, or hematemesis Neurologic:  No visual changes, wkns, changes in mental status. All other systems reviewed and are otherwise negative except as noted above.       Physical Exam    VS:  BP 116/80 (BP Location: Left Arm, Patient Position: Sitting, Cuff Size: Normal)   Pulse 89   Ht 5\' 11"  (1.803 m)   Wt 206 lb (93.4 kg)   SpO2 95%   BMI 28.73 kg/m  , BMI Body mass index is 28.73 kg/m.     GEN: Well nourished, well developed, in no acute distress. HEENT: normal. Neck: Supple, no JVD, carotid bruits, or masses. Cardiac: IRRR, no murmurs, rubs, or gallops. No clubbing, cyanosis, edema.    Radials/DP/PT 2+ and equal bilaterally.  Respiratory:  Respirations regular and unlabored, mild crackles in the bases.  Back EXTR purpuric GI: Soft, nontender, nondistended, BS + x 4. MS: no deformity or atrophy. Skin: warm and dry, no rash. Neuro:  Strength and sensation are intact. Psych: Normal affect.      Lab Results  Component Value Date   WBC 5.9 10/20/2022    HGB 13.8 10/20/2022   HCT 41.5  10/20/2022   MCV 92.6 10/20/2022   PLT 158 10/20/2022   Lab Results  Component Value Date   CREATININE 0.68 10/20/2022   BUN 19 10/20/2022   NA 138 10/20/2022   K 3.6 10/20/2022   CL 101 10/20/2022   CO2 30 10/20/2022   Lab Results  Component Value Date   ALT 100 (H) 10/20/2022   AST 66 (H) 10/20/2022   ALKPHOS 64 10/20/2022   BILITOT 0.4 10/20/2022   Lab Results  Component Value Date   CHOL 212 (H) 09/25/2022   HDL 45 09/25/2022   LDLCALC 92 09/25/2022   TRIG 373 (H) 09/25/2022   CHOLHDL 4.7 09/25/2022    Lab Results  Component Value Date   HGBA1C 5.6 09/25/2022     Review of Prior Studies: CT of the Chest 10/11/2022  1. Resolution of previously noted nodular area of ground-glass attenuation in the right upper lobe compared to the prior study, indicative of a benign infectious or inflammatory process on the prior study. 2. Scattered areas of mild scarring throughout the lung bases bilaterally (right-greater-than-left), presumably sequela of multilobar pneumonia demonstrated on remote prior chest CT 09/13/2020. 3. Aortic atherosclerosis, in addition to left main and 2 vessel coronary artery disease. Please note that although the presence of coronary artery calcium documents the presence of coronary artery disease, the severity of this disease and any potential stenosis cannot be assessed on this non-gated CT examination. Assessment for potential risk factor modification, dietary therapy or pharmacologic therapy may be warranted, if clinically indicated. 4. There are calcifications of the aortic valve. Echocardiographic correlation for evaluation of potential valvular dysfunction may be warranted if clinically indicated. 5. Mild aneurysmal dilatation of the ascending thoracic aorta (4.5 cm in diameter). Recommend semi-annual imaging followup by CTA or MRA and referral to cardiothoracic surgery if not already obtained.    Echocardiogram 12/25/2021 1. Left ventricular ejection fraction, by estimation, is 60 to 65%. The  left ventricle has normal function. The left ventricle has no regional  wall motion abnormalities. Indeterminate diastolic filling due to E-A  fusion.   2. Right ventricular systolic function is normal. The right ventricular  size is normal. There is normal pulmonary artery systolic pressure. The  estimated right ventricular systolic pressure is 16.2 mmHg.   3. The mitral valve is grossly normal. Trivial mitral valve  regurgitation. No evidence of mitral stenosis.   4. The aortic valve is tricuspid. Aortic valve regurgitation is not  visualized. Aortic valve sclerosis is present, with no evidence of aortic  valve stenosis.   5. Aortic dilatation noted. There is mild dilatation of the aortic root,  measuring 42 mm. There is moderate dilatation of the ascending aorta,  measuring 43 mm.   6. The inferior vena cava is normal in size with greater than 50%  respiratory variability, suggesting right atrial pressure of 3 mmHg.      Lower Venous DVT Study 08/30/20: RIGHT:  - There is no evidence of deep vein thrombosis in the lower extremity.     - No cystic structure found in the popliteal fossa.  - Triphasic waveforms are noted in the posterior tibial, peroneal, and  anterior tibial arteries.     LEFT:  - There is no evidence of deep vein thrombosis in the lower extremity.     - No cystic structure found in the popliteal fossa.  - Triphasic waveforms are noted in the posterior tibial, peroneal, and  anterior tibial arteries.    TEE 09/16/20:  1.  Left ventricular ejection fraction, by estimation, is 45 to 50%. The  left ventricle has mildly decreased function.   2. Right ventricular systolic function is low normal. The right  ventricular size is normal.   3. No left atrial/left atrial appendage thrombus was detected.   4. The mitral valve is normal in structure. Trivial mitral valve   regurgitation. No evidence of mitral stenosis.   5. The aortic valve is tricuspid. Aortic valve regurgitation is not  visualized. No aortic stenosis is present.   6. Evidence of atrial level shunting detected by color flow Doppler.  There is a small patent foramen ovale with predominantly left to right  shunting across the atrial septum.   LOWER EXTREMITY DOPPLER STUDY 10/12/20: Right: Resting right ankle-brachial index indicates noncompressible right  lower extremity arteries.   Waveforms are strong triphasic.  The right toe-brachial index is normal.   Left: Resting left ankle-brachial index indicates noncompressible left  lower extremity arteries.   Waveforms are strong triphasic.  The left toe-brachial index is normal.     Assessment & Plan   1.  Hypertension: Blood pressures currently well-controlled on chlorthalidone, amlodipine, and metoprolol.  Labs are going to be drawn by primary care provider on 01/28/2023.  We will review these when available.  Continue current medication regimen  2.  Thoracic aortic aneurysm: Most recent CT scan of the chest which was completed for pulmonary nodule revealed size of thoracic aneurysm at 4.5 cm.  Will not repeat this study as it was completed 2 months ago.  Can follow-up and have this completed again in 6 months to 1 year.  He will follow-up with Dr. Bjorn Pippin in 6 months unless he becomes symptomatic.  3. Hypercholesterolemia: Goal of LDL less than 100.  He has nonobstructive CAD per coronary CTA.  Continue low-cholesterol diet.  Labs are followed by PCP.  4.  Polysubstance abuse: He has been sober for 4 months attending AA meetings, continues to be followed by [facility] health behavioral health and psychiatry.  Unfortunately he continues to smoke marijuana.  He is encouraged to continue this lifestyle change and ongoing follow-up and commitment to staying sober.         Signed, Bettey Mare. Liborio Nixon, ANP, AACC   01/21/2023 11:44 AM       Office [phone] Fax [phone]  Notice: This dictation was prepared with Dragon dictation along with smaller phrase technology. Any transcriptional errors that result from this process are unintentional and may not be corrected upon review.

## 2023-01-21 ENCOUNTER — Encounter: Payer: Self-pay | Admitting: Adult Health

## 2023-01-21 ENCOUNTER — Other Ambulatory Visit (HOSPITAL_COMMUNITY): Payer: Self-pay | Admitting: Psychiatry

## 2023-01-21 ENCOUNTER — Other Ambulatory Visit (HOSPITAL_COMMUNITY): Payer: Self-pay

## 2023-01-21 ENCOUNTER — Ambulatory Visit: Payer: Medicare Other | Attending: Adult Health | Admitting: Adult Health

## 2023-01-21 ENCOUNTER — Other Ambulatory Visit: Payer: Self-pay | Admitting: Student in an Organized Health Care Education/Training Program

## 2023-01-21 VITALS — BP 116/80 | HR 89 | Ht 71.0 in | Wt 206.0 lb

## 2023-01-21 DIAGNOSIS — F1910 Other psychoactive substance abuse, uncomplicated: Secondary | ICD-10-CM | POA: Insufficient documentation

## 2023-01-21 DIAGNOSIS — F1190 Opioid use, unspecified, uncomplicated: Secondary | ICD-10-CM

## 2023-01-21 DIAGNOSIS — E7800 Pure hypercholesterolemia, unspecified: Secondary | ICD-10-CM | POA: Diagnosis not present

## 2023-01-21 DIAGNOSIS — I10 Essential (primary) hypertension: Secondary | ICD-10-CM | POA: Diagnosis not present

## 2023-01-21 DIAGNOSIS — F1994 Other psychoactive substance use, unspecified with psychoactive substance-induced mood disorder: Secondary | ICD-10-CM

## 2023-01-21 DIAGNOSIS — I712 Thoracic aortic aneurysm, without rupture, unspecified: Secondary | ICD-10-CM | POA: Insufficient documentation

## 2023-01-21 DIAGNOSIS — F902 Attention-deficit hyperactivity disorder, combined type: Secondary | ICD-10-CM

## 2023-01-21 NOTE — Telephone Encounter (Signed)
Next appt scheduled 7/22 with PCP. LOV 4/22 with Dr Jacqualyn Posey.

## 2023-01-21 NOTE — Patient Instructions (Addendum)
Medication Instructions:  No Changes *If you need a refill on your cardiac medications before your next appointment, please call your pharmacy*   Lab Work: No Labs If you have labs (blood work) drawn today and your tests are completely normal, you will receive your results only by: MyChart Message (if you have MyChart) OR A paper copy in the mail If you have any lab test that is abnormal or we need to change your treatment, we will call you to review the results.   Testing/Procedures: No Testing   Follow-Up: At [facility], you and your health needs are our priority.  As part of our continuing mission to provide you with exceptional heart care, we have created designated Provider Care Teams.  These Care Teams include your primary Cardiologist (physician) and Advanced Practice Providers (APPs -  Physician Assistants and Nurse Practitioners) who all work together to provide you with the care you need, when you need it.  We recommend signing up for the patient portal called "MyChart".  Sign up information is provided on this After Visit Summary.  MyChart is used to connect with patients for Virtual Visits (Telemedicine).  Patients are able to view lab/test results, encounter notes, upcoming appointments, etc.  Non-urgent messages can be sent to your provider as well.   To learn more about what you can do with MyChart, go to [email or web address].    Your next appointment:   6 month(s)  Provider:   [name], MD

## 2023-01-28 ENCOUNTER — Encounter: Payer: Self-pay | Admitting: Student in an Organized Health Care Education/Training Program

## 2023-01-28 ENCOUNTER — Other Ambulatory Visit (HOSPITAL_COMMUNITY): Payer: Self-pay

## 2023-01-28 ENCOUNTER — Ambulatory Visit: Payer: Medicare Other | Admitting: Student in an Organized Health Care Education/Training Program

## 2023-01-28 ENCOUNTER — Other Ambulatory Visit: Payer: Self-pay

## 2023-01-28 VITALS — BP 120/80 | HR 79 | Temp 97.5°F | Ht 71.0 in | Wt 206.8 lb

## 2023-01-28 DIAGNOSIS — I2510 Atherosclerotic heart disease of native coronary artery without angina pectoris: Secondary | ICD-10-CM | POA: Diagnosis not present

## 2023-01-28 DIAGNOSIS — I10 Essential (primary) hypertension: Secondary | ICD-10-CM | POA: Diagnosis not present

## 2023-01-28 DIAGNOSIS — F1190 Opioid use, unspecified, uncomplicated: Secondary | ICD-10-CM

## 2023-01-28 DIAGNOSIS — F1994 Other psychoactive substance use, unspecified with psychoactive substance-induced mood disorder: Secondary | ICD-10-CM

## 2023-01-28 DIAGNOSIS — R7989 Other specified abnormal findings of blood chemistry: Secondary | ICD-10-CM | POA: Diagnosis not present

## 2023-01-28 MED ORDER — BUPRENORPHINE HCL-NALOXONE HCL 8-2 MG SL FILM
1.0000 | ORAL_FILM | Freq: Three times a day (TID) | SUBLINGUAL | 0 refills | Status: DC
Start: 2023-01-28 — End: 2023-02-12
  Filled 2023-01-28 – 2023-01-31 (×4): qty 90, 30d supply, fill #0

## 2023-01-28 MED ORDER — TESTOSTERONE 12.5 MG/ACT (1%) TD GEL
50.0000 mg | Freq: Every day | TRANSDERMAL | 0 refills | Status: DC
  Filled 2023-01-28: qty 150, 30d supply, fill #0

## 2023-01-28 NOTE — Assessment & Plan Note (Signed)
Blood pressure well-controlled today.  Plan to continue chlorthalidone 25 mg daily and amlodipine 5 mg daily.

## 2023-01-28 NOTE — Assessment & Plan Note (Addendum)
No issues with hallucinations or other psychosis symptoms.  This is being managed by psychiatry.  Doing well on Seroquel 300 mg daily and duloxetine 30 mg twice daily.  Will monitor lipids and liver enzymes while he is on this high of a dose of antipsychotic, has some mild coronary artery disease and may be at risk for metabolic syndrome in the future.

## 2023-01-28 NOTE — Assessment & Plan Note (Signed)
Symptomatically doing well on testosterone replacement therapy. He is using 150 mg daily topical.  Will check testosterone level today and CBC.

## 2023-01-28 NOTE — Progress Notes (Signed)
Established Patient Office Visit  Subjective   Patient ID: Joseph Ayala, male    DOB: 1965-12-07  Age: 57 y.o. MRN: [id number]  Chief Complaint  Patient presents with   Medication Refill   Back Pain   feet pain    HPI  57 year old person here for follow-up of opioid use disorder.  Doing really well since I last saw him.  Reports no use of opioids.  Enjoys going to [employer] twice daily.  Reports having good structure in his daily activities which is very helpful for him.  No recent illnesses.  No chest pain, no dyspnea with exertion.  No fevers or chills.  Weight is stable.  Good exertional capacity.  He has had good follow-up with other subspecialists including psychiatry, cardiology, cardiothoracic surgery.  Planning on seeing neurology at [facility] in September for his McArdle syndrome.  Reports good adherence with Suboxone.  Cravings are well-controlled.  Good adherence with other medications and no adverse side effects.    Objective:     BP 120/80 (BP Location: Left Arm, Patient Position: Sitting, Cuff Size: Small)   Pulse 79   Temp (!) 97.5 F (36.4 C) (Oral)   Ht 5\' 11"  (1.803 m)   Wt 206 lb 12.8 oz (93.8 kg)   SpO2 97%   BMI 28.84 kg/m    Physical Exam  General: Well-appearing man, no distress Neck: Prior tracheostomy scar is well-healed, normal thyroid, no lymphadenopathy Lungs: Normal work of breathing, clear throughout Heart: Regular rate and rhythm with no murmurs Extremities: Warm and well-perfused with no lower extremity edema Neuro: Alert, conversational, full strength upper and lower extremities, normal gait Psych: Appropriate mood and affect, not depressed or anxious appearing    Assessment & Plan:   Problem List Items Addressed This Visit       High   Opioid use disorder - Primary (Chronic)    Seems to be doing well, reports no recent use of opioids.  Active in AA meetings twice a day.  Good control of cravings.  Will plan to continue  Suboxone 8 mg 3 times daily.  Good adherence and no adverse side effects.  I checked the database which was appropriate.  Will check a tox assure today.        Relevant Medications   Buprenorphine HCl-Naloxone HCl 8-2 MG FILM   Substance induced mood disorder (HCC) (Chronic)    No issues with hallucinations or other psychosis symptoms.  This is being managed by psychiatry.  Doing well on Seroquel 300 mg daily and duloxetine 30 mg twice daily.  Will monitor lipids and liver enzymes while he is on this high of a dose of antipsychotic, has some mild coronary artery disease and may be at risk for metabolic syndrome in the future.      Relevant Orders   ToxAssure Select,+Antidepr,UR   Liver Profile   Coronary artery disease (Chronic)   Relevant Orders   Lipid Profile     Medium    Hypertension (Chronic)    Blood pressure well-controlled today.  Plan to continue chlorthalidone 25 mg daily and amlodipine 5 mg daily.        Low   Low testosterone (Chronic)    Symptomatically doing well on testosterone replacement therapy. He is using 150 mg daily topical.  Will check testosterone level today and CBC.       Relevant Orders   CBC no Diff   Testosterone, Total    Return in about 3 months (  around 04/30/2023).    [name], MD

## 2023-01-28 NOTE — Assessment & Plan Note (Signed)
Seems to be doing well, reports no recent use of opioids.  Active in AA meetings twice a day.  Good control of cravings.  Will plan to continue Suboxone 8 mg 3 times daily.  Good adherence and no adverse side effects.  I checked the database which was appropriate.  Will check a tox assure today.

## 2023-01-29 ENCOUNTER — Encounter: Payer: Self-pay | Admitting: Student in an Organized Health Care Education/Training Program

## 2023-01-29 ENCOUNTER — Other Ambulatory Visit (HOSPITAL_COMMUNITY): Payer: Self-pay

## 2023-01-29 LAB — CBC
Hematocrit: 40.8 % (ref 37.5–51.0)
Hemoglobin: 14.1 g/dL (ref 13.0–17.7)
MCH: 31.2 pg (ref 26.6–33.0)
MCHC: 34.6 g/dL (ref 31.5–35.7)
MCV: 90 fL (ref 79–97)
Platelets: 170 10*3/uL (ref 150–450)
RBC: 4.52 x10E6/uL (ref 4.14–5.80)
RDW: 12.2 % (ref 11.6–15.4)
WBC: 4.4 10*3/uL (ref 3.4–10.8)

## 2023-01-29 LAB — LIPID PANEL
Chol/HDL Ratio: 5.2 ratio — ABNORMAL HIGH (ref 0.0–5.0)
Cholesterol, Total: 212 mg/dL — ABNORMAL HIGH (ref 100–199)
HDL: 41 mg/dL (ref >39)
LDL Chol Calc (NIH): 131 mg/dL — ABNORMAL HIGH (ref 0–99)
Triglycerides: 224 mg/dL — ABNORMAL HIGH (ref 0–149)
VLDL Cholesterol Cal: 40 mg/dL (ref 5–40)

## 2023-01-29 LAB — HEPATIC FUNCTION PANEL
ALT: 196 IU/L — ABNORMAL HIGH (ref 0–44)
AST: 352 IU/L — ABNORMAL HIGH (ref 0–40)
Albumin: 4.5 g/dL (ref 3.8–4.9)
Alkaline Phosphatase: 82 IU/L (ref 44–121)
Bilirubin Total: 0.2 mg/dL (ref 0.0–1.2)
Bilirubin, Direct: <0.1 mg/dL (ref 0.00–0.40)
Total Protein: 6.6 g/dL (ref 6.0–8.5)

## 2023-01-29 LAB — TESTOSTERONE: Testosterone: <3 ng/dL — ABNORMAL LOW (ref 264–916)

## 2023-01-30 ENCOUNTER — Other Ambulatory Visit (HOSPITAL_COMMUNITY): Payer: Self-pay

## 2023-01-30 MED ORDER — ANDRODERM 4 MG/24HR TD PT24
1.0000 | MEDICATED_PATCH | Freq: Every day | TRANSDERMAL | 1 refills | Status: DC
  Filled 2023-01-30: qty 30, 30d supply, fill #0

## 2023-01-31 ENCOUNTER — Telehealth: Payer: Self-pay | Admitting: Student in an Organized Health Care Education/Training Program

## 2023-01-31 ENCOUNTER — Other Ambulatory Visit: Payer: Self-pay

## 2023-01-31 ENCOUNTER — Encounter (HOSPITAL_COMMUNITY): Payer: Self-pay | Admitting: Psychiatry

## 2023-01-31 ENCOUNTER — Telehealth (HOSPITAL_BASED_OUTPATIENT_CLINIC_OR_DEPARTMENT_OTHER): Payer: Medicare Other | Admitting: Psychiatry

## 2023-01-31 ENCOUNTER — Other Ambulatory Visit (HOSPITAL_COMMUNITY): Payer: Self-pay

## 2023-01-31 VITALS — Wt 206.0 lb

## 2023-01-31 DIAGNOSIS — F1120 Opioid dependence, uncomplicated: Secondary | ICD-10-CM | POA: Diagnosis not present

## 2023-01-31 DIAGNOSIS — F1994 Other psychoactive substance use, unspecified with psychoactive substance-induced mood disorder: Secondary | ICD-10-CM | POA: Diagnosis not present

## 2023-01-31 DIAGNOSIS — F902 Attention-deficit hyperactivity disorder, combined type: Secondary | ICD-10-CM

## 2023-01-31 DIAGNOSIS — F1990 Other psychoactive substance use, unspecified, uncomplicated: Secondary | ICD-10-CM | POA: Diagnosis not present

## 2023-01-31 LAB — TOXASSURE SELECT,+ANTIDEPR,UR

## 2023-01-31 MED ORDER — QUETIAPINE FUMARATE 300 MG PO TABS
300.0000 mg | ORAL_TABLET | Freq: Every day | ORAL | 2 refills | Status: DC
Start: 2023-01-31 — End: 2023-05-03
  Filled 2023-01-31: qty 30, 30d supply, fill #0
  Filled 2023-02-12 – 2023-02-28 (×3): qty 30, 30d supply, fill #1
  Filled 2023-03-29: qty 30, 30d supply, fill #2

## 2023-01-31 MED ORDER — DULOXETINE HCL 30 MG PO CPEP
30.0000 mg | ORAL_CAPSULE | Freq: Two times a day (BID) | ORAL | 2 refills | Status: DC
Start: 2023-01-31 — End: 2023-06-20
  Filled 2023-01-31 – 2023-02-12 (×2): qty 60, 30d supply, fill #0
  Filled 2023-03-13: qty 60, 30d supply, fill #1
  Filled 2023-04-23: qty 60, 30d supply, fill #2

## 2023-01-31 NOTE — Telephone Encounter (Signed)
Pt states the following medication is $200.00 for the patch and he can not afford that and would like something else called in that is affordable.   testosterone (ANDRODERM) 4 MG/24HR PT24 patch  [facility] [place]

## 2023-01-31 NOTE — Progress Notes (Signed)
[facility] MD Virtual Progress Note   Patient Location: In Car Provider Location: Office  I connect with patient by video and verified that I am speaking with correct person by using two identifiers. I discussed the limitations of evaluation and management by telemedicine and the availability of in person appointments. I also discussed with the patient that there may be a patient responsible charge related to this service. The patient expressed understanding and agreed to proceed.  Joseph Ayala [id number] 57 y.o.  01/31/2023 2:39 PM  History of Present Illness:  Patient is evaluated by video session.  He admitted had a relapse last Saturday when his cousin came to visit his grandmother and patient took amphetamine.  He regret about his relapse.  He admitted that he did not go very well with his wife but he is committed to AA meeting and not taking any opiates.  Patient feels bad and he promised to remain committed to stay away from drugs.  He is getting Suboxone from his primary care.  He also taking Cymbalta and recently increased the Seroquel dose 300 which has been helping his sleep.  He denies any irritability, anger, mania, psychosis or any hallucination.  He denies any suicidal thoughts.  We increased Seroquel last visit which is helping his sleep.  His appetite is okay.  His weight is stable.  Recently had a blood work and his liver enzymes are 3 times higher.  He admitted taking Tylenol up to 4000 a day to help his neuropathy.  He also taking gabapentin 900 mg a day.  Patient told despite taking high-dose Tylenol and gabapentin he is still struggle with neuropathy.  He is also taking testosterone but level is low.  He admitted some time attention and focus issues but did not get ADHD evaluation.  He trying to committed and involved in church activities.  His goal is to remain sober from drinking, opiates and drugs.  So far he remains sober from drinking and opiates and he had a  relapse but is fully committed not to go back into drugs.  If  Past Psychiatric History: Reported using drugs since early age. As per EMR he has used marijuana, cocaine, alcohol, meth, opiates and at least 15 rehab. In this year he had at least 3 rehabilitation. Recall few days [facility] [place] for a few days and then he was found himself at [facility] ER after found confused. He then had evaluation at [facility] based crisis in the last year was sent to [facility] for rehab. History of suicidal thoughts, hallucination, paranoia, confusion while using drugs or coming off from withdrawals. Multiple ER visit at [facility] health system. No history of IV drug use. Maximum sobriety for 6 years from 2012-2018. As per EMR took mirtazapine, olanzapine, trazodone but do not remember the details.    Outpatient Encounter Medications as of 01/31/2023  Medication Sig   albuterol (VENTOLIN HFA) 108 (90 Base) MCG/ACT inhaler Inhale 2 puffs into the lungs every 6 (six) hours as needed for wheezing or shortness of breath.   amLODipine (NORVASC) 5 MG tablet Take 1 tablet (5 mg total) by mouth daily.   Buprenorphine HCl-Naloxone HCl 8-2 MG FILM Place 1 Film under the tongue in the morning, at noon, and at bedtime.   chlorthalidone (HYGROTON) 25 MG tablet Take 1 tablet (25 mg total) by mouth daily.   DULoxetine (CYMBALTA) 30 MG capsule Take 1 capsule (30 mg total) by mouth 2 (  two) times daily.   gabapentin (NEURONTIN) 300 MG capsule Take 3 capsules (900 mg total) by mouth 3 (three) times daily for Neuropathy   metoprolol succinate (TOPROL-XL) 50 MG 24 hr tablet Take 1 tablet (50 mg total) by mouth daily. Take with or immediately following a meal.   QUEtiapine (SEROQUEL) 300 MG tablet Take 1 tablet (300 mg total) by mouth at bedtime.   sildenafil (VIAGRA) 25 MG tablet Take 1 tablet (25 mg total) by mouth as needed for erectile dysfunction. (Patient not taking: Reported on 01/21/2023)    testosterone (ANDRODERM) 4 MG/24HR PT24 patch Place 1 patch onto the skin daily.   No facility-administered encounter medications on file as of 01/31/2023.    Recent Results (from the past 2160 hour(s))  CBC no Diff     Status: None   Collection Time: 01/28/23 11:12 AM  Result Value Ref Range   WBC 4.4 3.4 - 10.8 x10E3/uL   RBC 4.52 4.14 - 5.80 x10E6/uL   Hemoglobin 14.1 13.0 - 17.7 g/dL   Hematocrit 09.8 11.9 - 51.0 %   MCV 90 79 - 97 fL   MCH 31.2 26.6 - 33.0 pg   MCHC 34.6 31.5 - 35.7 g/dL   RDW 14.7 82.9 - 56.2 %   Platelets 170 150 - 450 x10E3/uL  Testosterone, Total     Status: Abnormal   Collection Time: 01/28/23 11:12 AM  Result Value Ref Range   Testosterone <3 (L) 264 - 916 ng/dL    Comment: Adult male reference interval is based on a population of healthy nonobese males (BMI <30) between 82 and 58 years old. Travison, et.al. JCEM [phone]. PMID: [id number].   Liver Profile     Status: Abnormal   Collection Time: 01/28/23 11:12 AM  Result Value Ref Range   Total Protein 6.6 6.0 - 8.5 g/dL   Albumin 4.5 3.8 - 4.9 g/dL   Bilirubin Total 0.2 0.0 - 1.2 mg/dL   Bilirubin, Direct <0.10 0.00 - 0.40 mg/dL   Alkaline Phosphatase 82 44 - 121 IU/L   AST 352 (H) 0 - 40 IU/L   ALT 196 (H) 0 - 44 IU/L  Lipid Profile     Status: Abnormal   Collection Time: 01/28/23 11:12 AM  Result Value Ref Range   Cholesterol, Total 212 (H) 100 - 199 mg/dL   Triglycerides 272 (H) 0 - 149 mg/dL   HDL 41 >53 mg/dL   VLDL Cholesterol Cal 40 5 - 40 mg/dL   LDL Chol Calc (NIH) 664 (H) 0 - 99 mg/dL   Chol/HDL Ratio 5.2 (H) 0.0 - 5.0 ratio    Comment:                                   T. Chol/HDL Ratio                                             Men  Women                               1/2 Avg.Risk  3.4    3.3  Avg.Risk  5.0    4.4                                2X Avg.Risk  9.6    7.1                                3X Avg.Risk 23.4   11.0   ToxAssure  Select,+Antidepr,UR     Status: None   Collection Time: 01/28/23 11:13 AM  Result Value Ref Range   Summary Note     Comment: ==================================================================== ToxAssure Select,+Antidepr,UR ==================================================================== Test                             Result       Flag       Units  Drug Present and Declared for Prescription Verification   Buprenorphine                  39           EXPECTED   ng/mg creat   Norbuprenorphine               55           EXPECTED   ng/mg creat    Source of buprenorphine is a scheduled prescription medication.    Norbuprenorphine is an expected metabolite of buprenorphine.  Drug Present not Declared for Prescription Verification   Methamphetamine                >5376        UNEXPECTED ng/mg creat   Amphetamine                    1659         UNEXPECTED ng/mg creat    Sources of methamphetamine include illicit sources, as a scheduled    prescription medication, as a metabolite of some prescription drugs,    or use of an l-methamphetamine inhaler.     Amphetamine is an expected metabolite of  methamphetamine.    Amphetamine is also available as a schedule II prescription drug.    Carboxy-THC                    565          UNEXPECTED ng/mg creat    Carboxy-THC is a metabolite of tetrahydrocannabinol (THC). Source of    THC is most commonly herbal marijuana or marijuana-based products,    but THC is also present in a scheduled prescription medication.    Trace amounts of THC can be present in hemp and cannabidiol (CBD)    products. This test is not intended to distinguish between delta-9-    tetrahydrocannabinol, the predominant form of THC in most herbal or    marijuana-based products, and delta-8-tetrahydrocannabinol.    Duloxetine                     PRESENT      UNEXPECTED ==================================================================== Test                      Result     Flag   Units      Ref Range   Creatinine              93  mg/dL      >=40 ==================================================================== Declared Medications:  T he flagging and interpretation on this report are based on the  following declared medications.  Unexpected results may arise from  inaccuracies in the declared medications.   **Note: The testing scope of this panel does not include small to  moderate amounts of these reported medications:   Buprenorphine ==================================================================== For clinical consultation, please call [phone]. ====================================================================      Psychiatric Specialty Exam: Physical Exam  Review of Systems  Neurological:  Positive for numbness.    Weight 206 lb (93.4 kg).There is no height or weight on file to calculate BMI.  General Appearance: Casual  Eye Contact:  Fair  Speech:  Clear and Coherent  Volume:  Normal  Mood:  Dysphoric  Affect:  Congruent  Thought Process:  Descriptions of Associations: Intact  Orientation:  Full (Time, Place, and Person)  Thought Content:  Rumination  Suicidal Thoughts:  No  Homicidal Thoughts:  No  Memory:  Immediate;   Good Recent;   Good Remote;   Fair  Judgement:  Fair  Insight:  Shallow  Psychomotor Activity:  Decreased  Concentration:  Concentration: Fair and Attention Span: Fair  Recall:  Fair  Fund of Knowledge:  Good  Language:  Good  Akathisia:  No  Handed:  Right  AIMS (if indicated):     Assets:  Communication Skills Desire for Improvement Housing Social Support Transportation  ADL's:  Intact  Cognition:  WNL  Sleep:  better     Assessment/Plan: Substance induced mood disorder (HCC) - Plan: DULoxetine (CYMBALTA) 30 MG capsule, QUEtiapine (SEROQUEL) 300 MG tablet  Attention deficit hyperactivity disorder (ADHD), combined type - Plan: DULoxetine (CYMBALTA) 30 MG capsule, QUEtiapine  (SEROQUEL) 300 MG tablet  Polysubstance use disorder - Plan: DULoxetine (CYMBALTA) 30 MG capsule, QUEtiapine (SEROQUEL) 300 MG tablet  Opioid use disorder, severe, on maintenance therapy, dependence (HCC) - Plan: DULoxetine (CYMBALTA) 30 MG capsule, QUEtiapine (SEROQUEL) 300 MG tablet  I reviewed blood work results.  He has a high liver enzymes.  He has a 2-week follow-up blood work but admitted taking very heavy doses of Tylenol to help his numbness and tingling and gabapentin.  We discussed possible causes that can cause liver enzymes.  Patient like to stop the Tylenol and gabapentin to see if that helps his liver enzymes.  He has a appointment for lab work in 2 weeks.  He is taking Cymbalta 30 mg 2 times a day for a while and recently Seroquel dose increase but he had a history of high liver enzymes in the past.  Since he reported not drinking he believe higher liver enzymes MOST likely related to taking high Tylenol.  He does not want to change the medication since it is keeping him stable and he also like to continue AA program and church activities.  Continue Seroquel 300 mg at bedtime, Cymbalta 30 mg 2 times a day.  I will also forward my note to his primary care as patient decided to stop the gabapentin and also he will stop the Tylenol.  Recommend he should consult with his primary care.  Encouraged to continue AA program.  Recommend to call us back if is any question or any concern.  Follow-up in 3 months.  Follow Up Instructions:     I discussed the assessment and treatment plan with the patient. The patient was provided an opportunity to ask questions and all were answered. The patient agreed with the plan and demonstrated  an understanding of the instructions.   The patient was advised to call back or seek an in-person evaluation if the symptoms worsen or if the condition fails to improve as anticipated.    Collaboration of Care: Other provider involved in patient's care AEB notes are  available in epic to review.  Patient/Guardian was advised Release of Information must be obtained prior to any record release in order to collaborate their care with an outside provider. Patient/Guardian was advised if they have not already done so to contact the registration department to sign all necessary forms in order for [place] to release information regarding their care.   Consent: Patient/Guardian gives verbal consent for treatment and assignment of benefits for services provided during this visit. Patient/Guardian expressed understanding and agreed to proceed.     I provided 27 minutes of non face to face time during this encounter.  Note: This document was prepared by [employer] voice dictation technology and any errors that results from this process are unintentional.    Cleotis Nipper, MD 01/31/2023

## 2023-02-01 ENCOUNTER — Other Ambulatory Visit (HOSPITAL_COMMUNITY): Payer: Self-pay

## 2023-02-01 MED ORDER — TESTOSTERONE CYPIONATE 200 MG/ML IM SOLN
400.0000 mg | INTRAMUSCULAR | 0 refills | Status: DC
Start: 2023-02-01 — End: 2023-04-01
  Filled 2023-02-01: qty 2, 14d supply, fill #0
  Filled 2023-02-01 – 2023-02-09 (×2): qty 10, 70d supply, fill #0

## 2023-02-01 NOTE — Addendum Note (Signed)
Addended by: Erlinda Hong T on: 02/01/2023 08:53 AM   Modules accepted: Orders

## 2023-02-04 NOTE — Addendum Note (Signed)
Addended by: Bufford Spikes on: 02/04/2023 11:06 AM   Modules accepted: Orders

## 2023-02-07 ENCOUNTER — Telehealth: Payer: Self-pay

## 2023-02-07 ENCOUNTER — Other Ambulatory Visit (HOSPITAL_COMMUNITY): Payer: Self-pay

## 2023-02-07 NOTE — Telephone Encounter (Signed)
Pharmacy Patient Advocate Encounter  Received notification from [employer] that Prior Authorization for TESTOSTERONE CYPIONATE has been  APPROVED 02/07/23 TO 02/07/24  PA #/Case ID/Reference #: [id number]

## 2023-02-07 NOTE — Telephone Encounter (Signed)
Pharmacy Patient Advocate Encounter   Received notification from [employer] that prior authorization for TESTOSTERONE CYPIONATE is required/requested.   PA required; PA submitted to [employer] via [employer] Key/confirmation #/EOC [id number]. Status is pending

## 2023-02-08 ENCOUNTER — Other Ambulatory Visit (HOSPITAL_COMMUNITY): Payer: Self-pay

## 2023-02-09 ENCOUNTER — Other Ambulatory Visit (HOSPITAL_COMMUNITY): Payer: Self-pay

## 2023-02-12 ENCOUNTER — Other Ambulatory Visit (HOSPITAL_COMMUNITY): Payer: Self-pay

## 2023-02-12 ENCOUNTER — Other Ambulatory Visit: Payer: Self-pay | Admitting: Student in an Organized Health Care Education/Training Program

## 2023-02-12 ENCOUNTER — Other Ambulatory Visit: Payer: Self-pay

## 2023-02-12 DIAGNOSIS — F1190 Opioid use, unspecified, uncomplicated: Secondary | ICD-10-CM

## 2023-02-12 NOTE — Telephone Encounter (Signed)
Suboxone - Last rx written - 01/28/23

## 2023-02-13 ENCOUNTER — Other Ambulatory Visit (HOSPITAL_COMMUNITY): Payer: Self-pay

## 2023-02-13 MED ORDER — BUPRENORPHINE HCL-NALOXONE HCL 8-2 MG SL FILM
1.0000 | ORAL_FILM | Freq: Three times a day (TID) | SUBLINGUAL | 0 refills | Status: DC
Start: 2023-02-13 — End: 2023-02-28
  Filled 2023-02-13 – 2023-02-28 (×2): qty 90, 30d supply, fill #0

## 2023-02-13 MED ORDER — ALBUTEROL SULFATE HFA 108 (90 BASE) MCG/ACT IN AERS
2.0000 | INHALATION_SPRAY | Freq: Four times a day (QID) | RESPIRATORY_TRACT | 2 refills | Status: DC | PRN
  Filled 2023-02-13: qty 6.7, 25d supply, fill #0
  Filled 2023-02-28 – 2023-03-13 (×2): qty 6.7, 25d supply, fill #1
  Filled 2023-04-01: qty 6.7, 25d supply, fill #2

## 2023-02-15 ENCOUNTER — Encounter: Payer: Self-pay | Admitting: Student in an Organized Health Care Education/Training Program

## 2023-02-15 ENCOUNTER — Other Ambulatory Visit (HOSPITAL_COMMUNITY): Payer: Self-pay

## 2023-02-15 DIAGNOSIS — F1190 Opioid use, unspecified, uncomplicated: Secondary | ICD-10-CM

## 2023-02-15 DIAGNOSIS — K4090 Unilateral inguinal hernia, without obstruction or gangrene, not specified as recurrent: Secondary | ICD-10-CM

## 2023-02-19 ENCOUNTER — Other Ambulatory Visit: Payer: Self-pay

## 2023-02-19 ENCOUNTER — Ambulatory Visit: Payer: Medicare Other | Admitting: Student

## 2023-02-19 DIAGNOSIS — R7989 Other specified abnormal findings of blood chemistry: Secondary | ICD-10-CM | POA: Diagnosis not present

## 2023-02-19 DIAGNOSIS — K4090 Unilateral inguinal hernia, without obstruction or gangrene, not specified as recurrent: Secondary | ICD-10-CM | POA: Diagnosis not present

## 2023-02-19 DIAGNOSIS — R748 Abnormal levels of other serum enzymes: Secondary | ICD-10-CM | POA: Insufficient documentation

## 2023-02-19 HISTORY — DX: Unilateral inguinal hernia, without obstruction or gangrene, not specified as recurrent: K40.90

## 2023-02-19 NOTE — Progress Notes (Deleted)
New Patient Office Visit  Subjective    Patient ID: Joseph Ayala, male    DOB: 1965-08-20  Age: 57 y.o. MRN: [id number]  CC:  Chief Complaint  Patient presents with   Follow-up    HERNIA  -PAIN # 8 -CHRONIC / FOOT PAIN    HPI Joseph Ayala presents to establish care ***  Outpatient Encounter Medications as of 02/19/2023  Medication Sig   albuterol (VENTOLIN HFA) 108 (90 Base) MCG/ACT inhaler Inhale 2 puffs into the lungs every 6 (six) hours as needed for wheezing or shortness of breath.   amLODipine (NORVASC) 5 MG tablet Take 1 tablet (5 mg total) by mouth daily.   Buprenorphine HCl-Naloxone HCl 8-2 MG FILM Place 1 Film under the tongue in the morning, at noon, and at bedtime.   chlorthalidone (HYGROTON) 25 MG tablet Take 1 tablet (25 mg total) by mouth daily.   DULoxetine (CYMBALTA) 30 MG capsule Take 1 capsule (30 mg total) by mouth 2 (two) times daily.   gabapentin (NEURONTIN) 300 MG capsule Take 3 capsules (900 mg total) by mouth 3 (three) times daily for Neuropathy   metoprolol succinate (TOPROL-XL) 50 MG 24 hr tablet Take 1 tablet (50 mg total) by mouth daily. Take with or immediately following a meal.   QUEtiapine (SEROQUEL) 300 MG tablet Take 1 tablet (300 mg total) by mouth at bedtime.   sildenafil (VIAGRA) 25 MG tablet Take 1 tablet (25 mg total) by mouth as needed for erectile dysfunction. (Patient not taking: Reported on 01/21/2023)   testosterone cypionate (DEPOTESTOSTERONE CYPIONATE) 200 MG/ML injection Inject 2 mLs (400 mg total) into the muscle every 14 days.   No facility-administered encounter medications on file as of 02/19/2023.    Past Medical History:  Diagnosis Date   Acute HFmrEF (heart failure with mildly reduced ejection fraction) (HCC) 10/31/2020   Acute hypoxemic respiratory failure (HCC)    Acute respiratory failure with hypoxia (HCC) 08/28/2020   CAP (community acquired pneumonia) 08/28/2020   Degenerative disc disease, lumbar    Dental crown  present    ETOH abuse    History of kidney stones    Hypertension    states under control with meds., has been on med. x 1 yr. - is currently out of med., to see PCP 02/06/2018   Marijuana use, continuous    MRSA pneumonia (HCC) 09/09/2020   Muscle atrophy 01/2018   Muscle weakness 01/2018   Necrotizing myopathy    Necrotizing pneumonia (HCC) 09/09/2020   Neuropathic pain    Opioid use disorder    PAF (paroxysmal atrial fibrillation) (HCC)    Pleural effusion    Sepsis (HCC) 08/28/2020   Thoracic aortic aneurysm (TAA) (HCC)    a. seen on CT 08/2020.    Past Surgical History:  Procedure Laterality Date   ENDOVENOUS ABLATION SAPHENOUS VEIN W/ LASER Right 12-29-2015   endovenous laser ablation right greater saphenous vein, stab phlebectomy > 20 incisions right leg, sclerotherapy right leg by Gretta Began MD     MINOR MUSCLE BIOPSY Right 02/11/2018   Procedure: RECTUS FEMORIS MUSCLE BIOPSY;  Surgeon: Darnell Level, MD;  Location: [facility];  Service: General;  Laterality: Right;   MUSCLE BIOPSY Right 02/11/2018   Procedure: DELTOID MUSCLE BIOPSY;  Surgeon: Darnell Level, MD;  Location: [facility];  Service: General;  Laterality: Right;   NASAL FRACTURE SURGERY     TRACHEOSTOMY TUBE PLACEMENT N/A 09/14/2020   Procedure: TRACHEOSTOMY;  Surgeon: Suzanna Obey,  MD;  Location: [facility];  Service: ENT;  Laterality: N/A;    Family History  Problem Relation Age of Onset   Varicose Veins Brother    Drug abuse Brother     Social History   Socioeconomic History   Marital status: Married    Spouse name: Not on file   Number of children: 1   Years of education: 13   Highest education level: Some college, no degree  Occupational History   Occupation: not working  Tobacco Use   Smoking status: Never   Smokeless tobacco: Former  Building services engineer status: Never Used  Substance and Sexual Activity   Alcohol use: Not Currently    Comment: occasionally   Drug use: No    Sexual activity: Yes    Partners: Female    Birth control/protection: None  Other Topics Concern   Not on file  Social History Narrative   Lives with wife and daughter in a one story home.  Has one child.  Currently not working.  Education: some college.    Social Determinants of Health   Financial Resource Strain: Low Risk  (03/19/2022)   Overall Financial Resource Strain (CARDIA)    Difficulty of Paying Living Expenses: Not hard at all  Food Insecurity: No Food Insecurity (03/19/2022)   Hunger Vital Sign    Worried About Running Out of Food in the Last Year: Never true    Ran Out of Food in the Last Year: Never true  Transportation Needs: No Transportation Needs (03/27/2022)   PRAPARE - Administrator, Civil Service (Medical): No    Lack of Transportation (Non-Medical): No  Physical Activity: Inactive (03/19/2022)   Exercise Vital Sign    Days of Exercise per Week: 0 days    Minutes of Exercise per Session: 0 min  Stress: No Stress Concern Present (03/19/2022)   [employer] of Occupational Health - Occupational Stress Questionnaire    Feeling of Stress : Not at all  Social Connections: Unknown (09/05/2022)   [facility] from [facility]   Social Network    Social Network: Not on file  Intimate Partner Violence: Unknown (09/05/2022)   Received from [facility]   HITS    Physically Hurt: Not on file    Insult or Talk Down To: Not on file    Threaten Physical Harm: Not on file    Scream or Curse: Not on file    ROS      Objective    There were no vitals taken for this visit.  Physical Exam  {Labs (Optional):23779}    Assessment & Plan:   Problem List Items Addressed This Visit       Other   Left inguinal hernia - Primary   Relevant Orders   Ambulatory referral to General Surgery   Elevated liver enzymes   Relevant Orders   CMP14 + Anion Gap   Gamma GT, GGT (16109)    No follow-ups on file.     Colbert Coyer, MD

## 2023-02-19 NOTE — Progress Notes (Addendum)
Established Patient Office Visit  Subjective   Patient ID: Joseph Ayala, male    DOB: 02/22/1966  Age: 57 y.o. MRN: [id number]  Chief Complaint  Patient presents with   Follow-up    HERNIA  -PAIN # 8 -CHRONIC / FOOT PAIN    Patient is a 57 y.o. with a past medical history stated below who presents today for symptomatic right inguinal hernia and follow up on liver enzyme labs. Patient is accompanied by his wife, Ginger, who works as a Engineer, civil (consulting) in post-op here at [facility]. Please see problem based assessment and plan for additional details.    Patient with history of left inguinal hernia that has been present for some years, slightly less reducible now than before but asymptomatic. Patient presents today with similar right inguinal hernia that has been bulging, tender to touch and painful with exertion and urination since Wednesday of last week (02/13/23). Most recently, patient has been feeling associated pain along the right testicle. Patient has been avoiding straining too much, as that worsens pain. Rest and positioning help with symptoms. Not having pain with defecation. Denies fever, nausea, vomiting, or abdominal pain. Does endorse intermittent chills. Patient previously seen for left sided hernia, but decided against surgery since not symptomatic. However, today patient is interested in general surgery consult with Dr. [name] to minimize risk of complications associated with hernias.   Patient and his wife are also interested in discussing recent lab results from 7/22 with Dr. Oswaldo Done, especially his liver panel where AST and ALT were 352 and 196, respectively. Per patient, he doesn't recall reviewing these specific results with Dr. Oswaldo Done, but they were brought up by his psychiatrist at his 7/25 visit. Psychiatrist advised patient to stop taking gabapentin and avoid Tylenol. Patient's last alcoholic drink from March 2024. Does endorse taking non-prescribed adderall prior to 7/22, where the  toxicology screen was negative for ethanol but positive for amphetamines.   Patient reports he received his first testosterone injection on Monday of last week. Injections are 2 mL every two weeks. Interested in retesting testosterone levels when appropriate.   Patient initially shared chronic foot pain as one of his chief complaints today, but decided to focus on hernia, liver enzyme labs, and testosterone today. Can follow up on chronic foot pain with patient at next follow up visit.    Past Medical History:  Diagnosis Date   Acute HFmrEF (heart failure with mildly reduced ejection fraction) (HCC) 10/31/2020   Acute hypoxemic respiratory failure (HCC)    Acute respiratory failure with hypoxia (HCC) 08/28/2020   CAP (community acquired pneumonia) 08/28/2020   Degenerative disc disease, lumbar    Dental crown present    ETOH abuse    History of kidney stones    Hypertension    states under control with meds., has been on med. x 1 yr. - is currently out of med., to see PCP 02/06/2018   Marijuana use, continuous    MRSA pneumonia (HCC) 09/09/2020   Muscle atrophy 01/2018   Muscle weakness 01/2018   Necrotizing myopathy    Necrotizing pneumonia (HCC) 09/09/2020   Neuropathic pain    Opioid use disorder    PAF (paroxysmal atrial fibrillation) (HCC)    Pleural effusion    Sepsis (HCC) 08/28/2020   Thoracic aortic aneurysm (TAA) (HCC)    a. seen on CT 08/2020.    Review of Systems  Constitutional:  Positive for chills. Negative for fever.  Respiratory:  Negative for cough and shortness  of breath.   Cardiovascular:  Negative for chest pain.  Gastrointestinal:  Negative for abdominal pain, constipation, nausea and vomiting.  Genitourinary:        Left testicular pain.  Skin: Negative.   Neurological:  Negative for dizziness and headaches.      Objective:     There were no vitals taken for this visit. BP Readings from Last 3 Encounters:  01/28/23 120/80  01/21/23 116/80  11/07/22  138/81   Wt Readings from Last 3 Encounters:  01/28/23 206 lb 12.8 oz (93.8 kg)  01/21/23 206 lb (93.4 kg)  11/07/22 210 lb (95.3 kg)      Physical Exam HENT:     Head: Normocephalic and atraumatic.     Mouth/Throat:     Mouth: Mucous membranes are moist.  Eyes:     Extraocular Movements: Extraocular movements intact.     Pupils: Pupils are equal, round, and reactive to light.  Pulmonary:     Effort: Pulmonary effort is normal.  Abdominal:     General: There is no distension.     Palpations: Abdomen is soft.     Tenderness: There is abdominal tenderness. There is no guarding.     Hernia: A hernia is present.     Comments: Bulging inguinal hernias without erythema or necrosis on right and left. Left hernia is reducible, non-tender, soft to the touch. Right hernia is tender to touch, with referred pain to right testicle.   Neurological:     Mental Status: He is alert.    No results found for any visits on 02/19/23.  Last metabolic panel Lab Results  Component Value Date   GLUCOSE 110 (H) 10/20/2022   NA 138 10/20/2022   K 3.6 10/20/2022   CL 101 10/20/2022   CO2 30 10/20/2022   BUN 19 10/20/2022   CREATININE 0.68 10/20/2022   GFRNONAA >60 10/20/2022   CALCIUM 9.4 10/20/2022   PHOS 3.9 09/16/2020   PROT 6.6 01/28/2023   ALBUMIN 4.5 01/28/2023   LABGLOB 1.9 07/23/2022   AGRATIO 2.5 (H) 07/23/2022   BILITOT 0.2 01/28/2023   ALKPHOS 82 01/28/2023   AST 352 (H) 01/28/2023   ALT 196 (H) 01/28/2023   ANIONGAP 7 10/20/2022    The 10-year ASCVD risk score (Arnett DK, et al., 2019) is: 13.6%    Assessment & Plan:   Problem List Items Addressed This Visit       Other   Low testosterone (Chronic)    Patient replaced topical testosterone with injections. He received his first testosterone injection on 02/11/23. Injection dose and frequency is 2 mL every two weeks. Patient's last testosterone 7/22 of < 3 ng/dL.  Plan:  - Consider repeat total testosterone level at 1  month follow up visit      Left inguinal hernia - Primary    Patient with history of left inguinal hernia, present for some years. Has been to a surgical consult in the past but decided against surgery as it has been asymptomatic. Slightly less reducible now. Patient interested in discussing surgical option since he will be moving forward with surgery for the right inguinal hernia.       Relevant Orders   Ambulatory referral to General Surgery   Elevated liver enzymes    Liver profile from 7/22 include AST 352 and ALT 96. Patient seen by psychiatrist 7/25, who advised patient to stop taking gabapentin and avoid Tylenol. Although findings are consistent with alcohol associated liver inflammation, patient's last alcoholic drink  was March 2024. Currently in AA, enjoys going to meetings. Patient endorses taking non-prescribed adderall prior to 7/22. [facility] lab from 7/22 positive for methamphetamines and THC.  Plan:  - CMP and GGT today to trend liver enzymes and assess for liver disease; will follow up results as needed      Relevant Orders   CMP14 + Anion Gap   Gamma GT, GGT ([id number])   Right inguinal hernia    Patient presents with right inguinal hernia that has been bulging, tender to touch and painful with exertion and urination for about a week. Worsening symptoms include associated pain along the right testicle. Rest and positioning help with pain and discomfort, while straining and movement worsen the pain. Denies fever, nausea, vomiting, or abdominal pain. Endorses intermittent chills. Patient interested in general surgery consult with Dr. [name]. Reviewed indications for going to the ED, including signs of intestinal obstruction or strangulation, while patient awaits consult.  Plan:  - Urgent referral placed to general surgery - Follow up in clinic in 1 month       Return in about 4 weeks (around 03/19/2023) for Hernia follow up, testosterone labs.   Patient seen with Dr.  Mikey Bussing.   Colbert Coyer, MD

## 2023-02-19 NOTE — Assessment & Plan Note (Signed)
Patient replaced topical testosterone with injections. He received his first testosterone injection on 02/11/23. Injection dose and frequency is 2 mL every two weeks. Patient's last testosterone 7/22 of < 3 ng/dL.  Plan:  - Consider repeat total testosterone level at 1 month follow up visit

## 2023-02-19 NOTE — Assessment & Plan Note (Signed)
Patient with history of left inguinal hernia, present for some years. Has been to a surgical consult in the past but decided against surgery as it has been asymptomatic. Slightly less reducible now. Patient interested in discussing surgical option since he will be moving forward with surgery for the right inguinal hernia.

## 2023-02-19 NOTE — Assessment & Plan Note (Addendum)
Patient presents with right inguinal hernia that has been bulging, tender to touch and painful with exertion and urination for about a week. Worsening symptoms include associated pain along the right testicle. Rest and positioning help with pain and discomfort, while straining and movement worsen the pain. Denies fever, nausea, vomiting, or abdominal pain. Endorses intermittent chills. Patient interested in general surgery consult with Dr. [name]. Reviewed indications for going to the ED, including signs of intestinal obstruction or strangulation, while patient awaits consult.  Plan:  - Urgent referral placed to general surgery - Follow up in clinic in 1 month

## 2023-02-19 NOTE — Assessment & Plan Note (Signed)
Liver profile from 7/22 include AST 352 and ALT 96. Patient seen by psychiatrist 7/25, who advised patient to stop taking gabapentin and avoid Tylenol. Although findings are consistent with alcohol associated liver inflammation, patient's last alcoholic drink was March 2024. Currently in AA, enjoys going to meetings. Patient endorses taking non-prescribed adderall prior to 7/22. [facility] lab from 7/22 positive for methamphetamines and THC.  Plan:  - CMP and GGT today to trend liver enzymes and assess for liver disease; will follow up results as needed

## 2023-02-19 NOTE — Patient Instructions (Addendum)
Thank you, [name] for allowing [place] to provide your care today. Today we discussed your right and left sided hernias, and your previous labs showing elevated liver enzymes.   I have ordered the following labs for you:  Lab Orders         CMP14 + Anion Gap         Gamma GT, GGT ([id number])      Tests ordered today:  None  Referrals ordered today:   Referral Orders         Ambulatory referral to General Surgery      I have ordered the following medication/changed the following medications:   Stop the following medications: There are no discontinued medications.   Start the following medications: No orders of the defined types were placed in this encounter.    Follow up:   - 4 week follow up or sooner if needed   Remember:   - They will call you to schedule your general surgery consult, we have included a preference to schedule with Dr. Axel Ayala (still considered an internal referral even though he is with [facility]).  - I will call you to review your lab results and to ensure they have called you about your general surgery consult.  - PLEASE go to the ED if you experience extreme pain that does not get better, you feel SOB/fever/chills/abdominal pain/nausea or vomiting as worsening symptoms with your inguinal hernia.   Should you have any questions or concerns please call the internal medicine clinic at [phone].      Joseph Coyer, MD PGY-1 Internal Medicine Teaching Progam [facility]

## 2023-02-21 ENCOUNTER — Telehealth: Payer: Self-pay | Admitting: *Deleted

## 2023-02-21 NOTE — Telephone Encounter (Signed)
   Pre-operative Risk Assessment    Patient Name: Joseph Ayala  DOB: 1965/12/20 MRN: [id number]      Request for Surgical Clearance    Procedure:   HERNIA SURGERY  Date of Surgery:  Clearance TBD                                 Surgeon:  DR. PAUL STECHSCHULTE Surgeon's Group or Practice Name:  [facility] Phone number:  [phone] Fax number:  [phone] ATTN: Trellis Moment, CMA   Type of Clearance Requested:   - Medical ; NO MEDICATIONS LISTED AS NEEDING TO BE HELD   Type of Anesthesia:  General    Additional requests/questions:    Elpidio Anis   02/21/2023, 5:33 PM

## 2023-02-21 NOTE — Progress Notes (Signed)
Internal Medicine Clinic Attending  I was physically present during the key portions of the resident provided service and participated in the medical decision making of patient's management care. I reviewed pertinent patient test results.  The assessment, diagnosis, and plan were formulated together and I agree with the documentation in the resident's note.  Suspect his AST>ALT elevation is due to his necrotizing myopathy (chronically elevated CK levels). He denied any ETOH consumption, no ETOH on previous Tox assures and his GGT is normal.  Gust Rung, DO

## 2023-02-22 ENCOUNTER — Other Ambulatory Visit: Payer: Self-pay | Admitting: Surgery

## 2023-02-22 DIAGNOSIS — N5082 Scrotal pain: Secondary | ICD-10-CM

## 2023-02-22 NOTE — Telephone Encounter (Signed)
     Primary Cardiologist: [name], MD  Chart reviewed as part of pre-operative protocol coverage. Given past medical history and time since last visit, based on ACC/AHA guidelines, Joseph Ayala would be at acceptable risk for the planned procedure without further cardiovascular testing.   His RCRI is a class II risk, 0.9% risk of major cardiac event.  I will route this recommendation to the requesting party via Epic fax function and remove from pre-op pool.  Please call with questions. Thomasene Ripple. Devrin Monforte NP-C     02/22/2023, 11:20 AM [facility] [address] Office [phone] Fax [phone]

## 2023-02-27 ENCOUNTER — Encounter: Payer: Medicare Other | Admitting: Student

## 2023-02-28 ENCOUNTER — Other Ambulatory Visit: Payer: Self-pay

## 2023-02-28 ENCOUNTER — Other Ambulatory Visit (HOSPITAL_COMMUNITY): Payer: Self-pay

## 2023-02-28 MED ORDER — BUPRENORPHINE HCL-NALOXONE HCL 8-2 MG SL FILM
1.0000 | ORAL_FILM | Freq: Three times a day (TID) | SUBLINGUAL | 0 refills | Status: DC
Start: 2023-02-28 — End: 2023-03-29
  Filled 2023-02-28: qty 90, 30d supply, fill #0

## 2023-02-28 NOTE — Addendum Note (Signed)
Addended by: Erlinda Hong T on: 02/28/2023 01:54 PM   Modules accepted: Orders

## 2023-03-01 ENCOUNTER — Ambulatory Visit
Admission: RE | Admit: 2023-03-01 | Discharge: 2023-03-01 | Disposition: A | Discharge status: Home / Self Care | Payer: Medicare Other | Source: Ambulatory Visit | Attending: Surgery | Admitting: Surgery

## 2023-03-01 ENCOUNTER — Other Ambulatory Visit (HOSPITAL_COMMUNITY): Payer: Self-pay

## 2023-03-01 DIAGNOSIS — N5082 Scrotal pain: Secondary | ICD-10-CM

## 2023-03-02 ENCOUNTER — Encounter: Payer: Self-pay | Admitting: Student in an Organized Health Care Education/Training Program

## 2023-03-02 DIAGNOSIS — N503 Cyst of epididymis: Secondary | ICD-10-CM

## 2023-03-13 ENCOUNTER — Other Ambulatory Visit (HOSPITAL_COMMUNITY): Payer: Self-pay

## 2023-03-18 ENCOUNTER — Encounter: Payer: Medicare Other | Admitting: Student in an Organized Health Care Education/Training Program

## 2023-03-29 ENCOUNTER — Other Ambulatory Visit (HOSPITAL_COMMUNITY): Payer: Self-pay

## 2023-03-29 ENCOUNTER — Other Ambulatory Visit: Payer: Self-pay | Admitting: Student in an Organized Health Care Education/Training Program

## 2023-03-29 DIAGNOSIS — F1190 Opioid use, unspecified, uncomplicated: Secondary | ICD-10-CM

## 2023-04-01 ENCOUNTER — Other Ambulatory Visit: Payer: Self-pay

## 2023-04-01 ENCOUNTER — Other Ambulatory Visit: Payer: Self-pay | Admitting: Student in an Organized Health Care Education/Training Program

## 2023-04-01 ENCOUNTER — Other Ambulatory Visit (HOSPITAL_COMMUNITY): Payer: Self-pay

## 2023-04-01 MED ORDER — BUPRENORPHINE HCL-NALOXONE HCL 8-2 MG SL FILM
1.0000 | ORAL_FILM | Freq: Three times a day (TID) | SUBLINGUAL | 0 refills | Status: AC
Start: 2023-04-01 — End: ?
  Filled 2023-04-01: qty 90, 30d supply, fill #0

## 2023-04-01 NOTE — Telephone Encounter (Signed)
Patient called he stated he is out of this med.

## 2023-04-01 NOTE — Telephone Encounter (Signed)
Next appt scheduled 9/30 with PCP.

## 2023-04-02 ENCOUNTER — Other Ambulatory Visit (HOSPITAL_COMMUNITY): Payer: Self-pay

## 2023-04-02 MED ORDER — TESTOSTERONE CYPIONATE 200 MG/ML IM SOLN
400.0000 mg | INTRAMUSCULAR | 0 refills | Status: DC
Start: 2023-04-02 — End: 2023-04-18
  Filled 2023-04-02: qty 10, 70d supply, fill #0

## 2023-04-08 ENCOUNTER — Encounter: Payer: Self-pay | Admitting: Student in an Organized Health Care Education/Training Program

## 2023-04-08 ENCOUNTER — Ambulatory Visit: Payer: Medicare Other | Admitting: Student in an Organized Health Care Education/Training Program

## 2023-04-08 ENCOUNTER — Other Ambulatory Visit (HOSPITAL_COMMUNITY): Payer: Self-pay

## 2023-04-08 VITALS — BP 107/77 | HR 102 | Temp 97.6°F | Ht 71.0 in | Wt 203.1 lb

## 2023-04-08 DIAGNOSIS — I10 Essential (primary) hypertension: Secondary | ICD-10-CM

## 2023-04-08 DIAGNOSIS — F1190 Opioid use, unspecified, uncomplicated: Secondary | ICD-10-CM

## 2023-04-08 DIAGNOSIS — M2142 Flat foot [pes planus] (acquired), left foot: Secondary | ICD-10-CM

## 2023-04-08 DIAGNOSIS — E7404 McArdle disease: Secondary | ICD-10-CM | POA: Diagnosis not present

## 2023-04-08 DIAGNOSIS — R7989 Other specified abnormal findings of blood chemistry: Secondary | ICD-10-CM

## 2023-04-08 DIAGNOSIS — F1191 Opioid use, unspecified, in remission: Secondary | ICD-10-CM

## 2023-04-08 MED ORDER — CHLORTHALIDONE 25 MG PO TABS
25.0000 mg | ORAL_TABLET | Freq: Every day | ORAL | 3 refills | Status: DC
Start: 2023-04-08 — End: 2024-04-29
  Filled 2023-04-08 – 2023-04-23 (×3): qty 90, 90d supply, fill #0
  Filled 2023-06-18 – 2023-08-14 (×3): qty 90, 90d supply, fill #1
  Filled 2023-10-28: qty 90, 90d supply, fill #2
  Filled 2024-02-04 (×2): qty 90, 90d supply, fill #3

## 2023-04-08 MED ORDER — BUPRENORPHINE HCL-NALOXONE HCL 8-2 MG SL FILM
1.0000 | ORAL_FILM | Freq: Three times a day (TID) | SUBLINGUAL | 1 refills | Status: DC
Start: 2023-04-08 — End: 2023-06-10
  Filled 2023-04-08 – 2023-05-23 (×2): qty 90, 30d supply, fill #0

## 2023-04-08 NOTE — Assessment & Plan Note (Signed)
Blood pressure well-controlled today.  Plan to continue chlorthalidone and amlodipine.

## 2023-04-08 NOTE — Assessment & Plan Note (Addendum)
Chronic stable problem.  Unable to find a dose of topical testosterone that had appropriate levels.  We switched back to intramuscular injections a few months ago and those have been going well.  Symptomatically he feels good.  Has been about 9 days since his last injection so we will check a testosterone level today along with CBC.   Addendum:  total testosterone levels are still very low, which is puzzling because he is using the maximum safe dose of IM testosterone 400mg  every two weeks. I spoke with him by phone, we reviewed appropriate injection technique, I stressed importance of getting the medicine into the gluteal muscle, and to look for any leaking after. His wife does the injections for him. We decided to change dosing to 200mg  IM weekly for now, as perhaps he is rapidly metabolizing. Will recheck a total and free testosterone 3-4 days after a dose, which he usually does on Saturdays.

## 2023-04-08 NOTE — Progress Notes (Addendum)
Established Patient Office Visit  Subjective   Patient ID: Joseph Ayala, male    DOB: 1966/04/30  Age: 57 y.o. MRN: [id number]  Chief Complaint  Patient presents with   Foot Pain    HPI  57 year old person here for follow-up of opioid use disorder.  Doing very well since I last saw him.  Reports good adherence with medications.  Denies any other opioid use.  Has some acute concerns of left foot pain that has been progressive over the last few months.  He walks about 10,000 steps a day, is very active, but this pain is starting to limit him.  He has been using supportive arches and new footwear, but with limited benefit.  Reports good adherence with his other medications, no side effects.  Still using Seroquel and duloxetine for mood disorder, this is managed with behavioral health.  Uses testosterone injection 400 mg every 2 weeks, denies any side effects.  Denies any chest pain, no dyspnea on exertion, no fevers or chills, no recent hospitalizations.    Objective:     BP 107/77 (BP Location: Right Arm, Patient Position: Sitting, Cuff Size: Small)   Pulse (!) 102   Temp 97.6 F (36.4 C) (Oral)   Ht 5\' 11"  (1.803 m)   Wt 203 lb 1.6 oz (92.1 kg)   SpO2 97%   BMI 28.33 kg/m    Physical Exam  General: Well-appearing man, no distress CV: Regular rate and rhythm with no murmurs Lungs: Clear to auscultation throughout Extremities: Warm well-perfused with no lower extremity edema Psych: Appropriate mood and affect, not depressed or anxious appearing    Assessment & Plan:   Problem List Items Addressed This Visit       High   Opioid use disorder (Chronic)    Chronic and stable.  Urine toxicology in July was appropriate with Suboxone.  I reviewed the database which showed appropriate dispense.  He reports good adherence with the medication and no side effects.  Will plan to continue Suboxone, sent 2 months refill, follow-up with me in 2 months.      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  Being managed by neurology at [facility].  Will plan to check CMP today.      Relevant Orders   CMP14 + Anion Gap (Completed)   CK, total (Completed)     Medium    Hypertension (Chronic)    Blood pressure well-controlled today.  Plan to continue chlorthalidone and amlodipine.      Relevant Medications   chlorthalidone (HYGROTON) 25 MG tablet     Low   Low testosterone (Chronic)    Chronic stable problem.  Unable to find a dose of topical testosterone that had appropriate levels.  We switched back to intramuscular injections a few months ago and those have been going well.  Symptomatically he feels good.  Has been about 9 days since his last injection so we will check a testosterone level today along with CBC.   Addendum:  total testosterone levels are still very low, which is puzzling because he is using the maximum safe dose of IM testosterone 400mg  every two weeks. I spoke with him by phone, we reviewed appropriate injection technique, I stressed importance of getting the medicine into the gluteal muscle, and to look for any leaking after. His wife does the injections for him. We  decided to change dosing to 200mg  IM weekly for now, as perhaps he is rapidly metabolizing. Will recheck a total and free testosterone 3-4 days after a dose, which he usually does on Saturdays.      Relevant Orders   Testosterone, Total (Completed)   CBC no Diff (Completed)     Unprioritized   Acquired pes planus of left foot - Primary    New problem, progressive pain in the left foot especially with standing.  On exam he has almost complete loss of the medial arch on the left foot.  He does not remember a specific injury that caused this, I did do not recall him ever having a fracture in the midfoot.  Over-the-counter supportive shoes have not been helpful, pain is  starting to limit his ambulation.  I am going to refer him to sports medicine for custom orthotics.      Relevant Orders   Ambulatory referral to Sports Medicine     [name], MD

## 2023-04-08 NOTE — Assessment & Plan Note (Signed)
New problem, progressive pain in the left foot especially with standing.  On exam he has almost complete loss of the medial arch on the left foot.  He does not remember a specific injury that caused this, I did do not recall him ever having a fracture in the midfoot.  Over-the-counter supportive shoes have not been helpful, pain is starting to limit his ambulation.  I am going to refer him to sports medicine for custom orthotics.

## 2023-04-08 NOTE — Assessment & Plan Note (Signed)
Chronic and stable.  Being managed by neurology at [facility].  Will plan to check CMP today.

## 2023-04-08 NOTE — Assessment & Plan Note (Signed)
Chronic and stable.  Urine toxicology in July was appropriate with Suboxone.  I reviewed the database which showed appropriate dispense.  He reports good adherence with the medication and no side effects.  Will plan to continue Suboxone, sent 2 months refill, follow-up with me in 2 months.

## 2023-04-09 LAB — CMP14 + ANION GAP
ALT: 61 [IU]/L — ABNORMAL HIGH (ref 0–44)
AST: 61 [IU]/L — ABNORMAL HIGH (ref 0–40)
Albumin: 4.8 g/dL (ref 3.8–4.9)
Alkaline Phosphatase: 94 [IU]/L (ref 44–121)
Anion Gap: 15 mmol/L (ref 10.0–18.0)
BUN/Creatinine Ratio: 28 — ABNORMAL HIGH (ref 9–20)
BUN: 18 mg/dL (ref 6–24)
Bilirubin Total: 0.3 mg/dL (ref 0.0–1.2)
CO2: 26 mmol/L (ref 20–29)
Calcium: 9.9 mg/dL (ref 8.7–10.2)
Chloride: 100 mmol/L (ref 96–106)
Creatinine, Ser: 0.64 mg/dL — ABNORMAL LOW (ref 0.76–1.27)
Globulin, Total: 2 g/dL (ref 1.5–4.5)
Glucose: 87 mg/dL (ref 70–99)
Potassium: 3.6 mmol/L (ref 3.5–5.2)
Sodium: 141 mmol/L (ref 134–144)
Total Protein: 6.8 g/dL (ref 6.0–8.5)
eGFR: 112 mL/min/{1.73_m2} (ref >59)

## 2023-04-09 LAB — CBC
Hematocrit: 43.5 % (ref 37.5–51.0)
Hemoglobin: 14.5 g/dL (ref 13.0–17.7)
MCH: 30.8 pg (ref 26.6–33.0)
MCHC: 33.3 g/dL (ref 31.5–35.7)
MCV: 92 fL (ref 79–97)
Platelets: 192 10*3/uL (ref 150–450)
RBC: 4.71 x10E6/uL (ref 4.14–5.80)
RDW: 12.4 % (ref 11.6–15.4)
WBC: 6.6 10*3/uL (ref 3.4–10.8)

## 2023-04-09 LAB — TESTOSTERONE: Testosterone: 9 ng/dL — ABNORMAL LOW (ref 264–916)

## 2023-04-09 LAB — CK: Total CK: 1770 U/L (ref 41–331)

## 2023-04-11 ENCOUNTER — Other Ambulatory Visit (HOSPITAL_COMMUNITY): Payer: Self-pay

## 2023-04-11 MED ORDER — DICLOFENAC SODIUM 1 % EX GEL
Freq: Four times a day (QID) | CUTANEOUS | 3 refills | Status: AC
Start: 2023-04-11 — End: ?
  Filled 2023-04-11: qty 100, 15d supply, fill #0
  Filled 2023-06-18: qty 200, 20d supply, fill #0

## 2023-04-12 ENCOUNTER — Other Ambulatory Visit: Payer: Self-pay | Admitting: Student in an Organized Health Care Education/Training Program

## 2023-04-12 DIAGNOSIS — Z1211 Encounter for screening for malignant neoplasm of colon: Secondary | ICD-10-CM

## 2023-04-12 DIAGNOSIS — Z1212 Encounter for screening for malignant neoplasm of rectum: Secondary | ICD-10-CM

## 2023-04-18 ENCOUNTER — Encounter: Payer: Self-pay | Admitting: Family Medicine

## 2023-04-18 ENCOUNTER — Encounter (HOSPITAL_COMMUNITY): Payer: Self-pay

## 2023-04-18 ENCOUNTER — Other Ambulatory Visit (HOSPITAL_COMMUNITY): Payer: Self-pay

## 2023-04-18 ENCOUNTER — Ambulatory Visit (INDEPENDENT_AMBULATORY_CARE_PROVIDER_SITE_OTHER): Payer: Medicare Other | Admitting: Family Medicine

## 2023-04-18 VITALS — BP 128/86 | Ht 71.0 in | Wt 200.0 lb

## 2023-04-18 DIAGNOSIS — M25572 Pain in left ankle and joints of left foot: Secondary | ICD-10-CM | POA: Insufficient documentation

## 2023-04-18 DIAGNOSIS — M76822 Posterior tibial tendinitis, left leg: Secondary | ICD-10-CM | POA: Insufficient documentation

## 2023-04-18 MED ORDER — TESTOSTERONE CYPIONATE 200 MG/ML IM SOLN
200.0000 mg | INTRAMUSCULAR | 0 refills | Status: DC
Start: 2023-04-18 — End: 2023-06-10
  Filled 2023-04-18 (×2): qty 10, 70d supply, fill #0

## 2023-04-18 NOTE — Assessment & Plan Note (Signed)
Left medial foot pain with arch collapse and associated posterior tibialis dysfunction

## 2023-04-18 NOTE — Assessment & Plan Note (Addendum)
Left medial foot pain x 6+ months with associated longitudinal arch collapse and posterior tibialis dysfunction.  May also have component of underlying first MTP or TMT OA.  Plan:  1.  Reviewed PCP notes from visit 04/08/2023 with Dr. Oswaldo Done. 2.  Patient was fitted with a set of temporary orthotics in the office today.  Fitted with a pad sports insoles size 10-11 with medium size scaphoid pads for arch support on each orthotic, also given medial heel wedge on the left orthotic.  Reassessment of feet after inserting orthotics and shoes showed improved alignment.  He had improved gait with only minimal overpronation on the left.  He noted dramatic improvement in his pain after inserting orthotics in his shoes. 3.  Imaging: Left foot x-ray order placed to rule out osteoarthritis or other bony abnormalities 4.  Counseled to avoid walking barefoot.  She will try to wear some footwear at home with arch support whenever possible. 5.  Follow-up 4 weeks to reassess and see how he is responding to the temporary orthotics.  We could consider custom orthotics in the future if needed. 6.  I will reach out with x-ray results when available 7.  Patient expressed understanding agreement, all questions were answered

## 2023-04-18 NOTE — Addendum Note (Signed)
Addended by: Erlinda Hong T on: 04/18/2023 09:14 AM   Modules accepted: Orders

## 2023-04-18 NOTE — Progress Notes (Signed)
DATE OF VISIT: 04/18/2023        Joseph Ayala DOB: 1965-12-02 MRN: [id number]  CC:  Lt foot pain  History- Joseph Ayala is a 57 y.o.  male for evaluation and treatment of Lt foot pain PMH significant for opioid use d/o, McArdles's syndrome (following with Neurology at [facility]), HTN, low testosterone   Lt foot pain x 6+ months Pain along medial foot/arch Notes some swelling on dorsum of foot Has goal of walking 10,000 steps/day, but cannot now due to foot pain Pain is limiting activity Tried OTC shoe inserts, but they were not comfortable and did not help Seen by PCP 04/08/23 and referred for consideration of orthotics Not taking medications for this On Seroquel and Cymbalta for his mood d/o No prior foot imaging  Notes was in coma Feb 2022 - was septic with necrotizing PNA - thinks has "nerve damage" related to that   Past Medical History Past Medical History:  Diagnosis Date   Acute HFmrEF (heart failure with mildly reduced ejection fraction) (HCC) 10/31/2020   Acute hypoxemic respiratory failure (HCC)    Acute respiratory failure with hypoxia (HCC) 08/28/2020   CAP (community acquired pneumonia) 08/28/2020   Degenerative disc disease, lumbar    Dental crown present    ETOH abuse    History of kidney stones    Hypertension    states under control with meds., has been on med. x 1 yr. - is currently out of med., to see PCP 02/06/2018   Marijuana use, continuous    MRSA pneumonia (HCC) 09/09/2020   Muscle atrophy 01/2018   Muscle weakness 01/2018   Necrotizing myopathy    Necrotizing pneumonia (HCC) 09/09/2020   Neuropathic pain    Opioid use disorder    PAF (paroxysmal atrial fibrillation) (HCC)    Pleural effusion    Sepsis (HCC) 08/28/2020   Thoracic aortic aneurysm (TAA) (HCC)    a. seen on CT 08/2020.    Past Surgical History Past Surgical History:  Procedure Laterality Date   ENDOVENOUS ABLATION SAPHENOUS VEIN W/ LASER Right 12-29-2015   endovenous laser  ablation right greater saphenous vein, stab phlebectomy > 20 incisions right leg, sclerotherapy right leg by Gretta Began MD     MINOR MUSCLE BIOPSY Right 02/11/2018   Procedure: RECTUS FEMORIS MUSCLE BIOPSY;  Surgeon: Darnell Level, MD;  Location: [facility];  Service: General;  Laterality: Right;   MUSCLE BIOPSY Right 02/11/2018   Procedure: DELTOID MUSCLE BIOPSY;  Surgeon: Darnell Level, MD;  Location: [facility];  Service: General;  Laterality: Right;   NASAL FRACTURE SURGERY     TRACHEOSTOMY TUBE PLACEMENT N/A 09/14/2020   Procedure: TRACHEOSTOMY;  Surgeon: Suzanna Obey, MD;  Location: [facility];  Service: ENT;  Laterality: N/A;    Medications Current Outpatient Medications  Medication Sig Dispense Refill   albuterol (VENTOLIN HFA) 108 (90 Base) MCG/ACT inhaler Inhale 2 puffs into the lungs every 6 (six) hours as needed for wheezing or shortness of breath. 6.7 g 2   amLODipine (NORVASC) 5 MG tablet Take 1 tablet (5 mg total) by mouth daily. 90 tablet 2   Buprenorphine HCl-Naloxone HCl 8-2 MG FILM Place 1 Film under the tongue in the morning, at noon, and at bedtime. 90 each 1   chlorthalidone (HYGROTON) 25 MG tablet Take 1 tablet (25 mg total) by mouth daily. 90 tablet 3   diclofenac Sodium (VOLTAREN) 1 % GEL Apply 1 gram topically 4 (four) times a day.  200 g 3   DULoxetine (CYMBALTA) 30 MG capsule Take 1 capsule (30 mg total) by mouth 2 (two) times daily. 60 capsule 2   gabapentin (NEURONTIN) 300 MG capsule Take 3 capsules (900 mg total) by mouth 3 (three) times daily for Neuropathy 270 capsule 1   metoprolol succinate (TOPROL-XL) 50 MG 24 hr tablet Take 1 tablet (50 mg total) by mouth daily. Take with or immediately following a meal. 90 tablet 3   QUEtiapine (SEROQUEL) 300 MG tablet Take 1 tablet (300 mg total) by mouth at bedtime. 30 tablet 2   sildenafil (VIAGRA) 25 MG tablet Take 1 tablet (25 mg total) by mouth as needed for erectile dysfunction. (Patient not taking:  Reported on 01/21/2023) 20 tablet 1   testosterone cypionate (DEPOTESTOSTERONE CYPIONATE) 200 MG/ML injection Inject 1 mL (200 mg total) into the muscle every 7 (seven) days. 10 mL 0   No current facility-administered medications for this visit.    Allergies is allergic to ivp dye [iodinated contrast media], lyrica cr [pregabalin er], vicodin [hydrocodone-acetaminophen], and quinolones.  Family History - reviewed per EMR and intake form  Social History   reports that he does not currently use alcohol.  reports that he has never smoked. He has quit using smokeless tobacco.  reports no history of drug use. OCCUPATION: on disability - not currently working   EXAM: Vitals: BP 128/86   Ht 5\' 11"  (1.803 m)   Wt 200 lb (90.7 kg)   BMI 27.89 kg/m  General: AOx3, NAD, pleasant SKIN: no rashes or lesions, skin clean, dry, intact MSK: FEET: Left foot with flexible flatfoot and overpronation.  Has posterior tibialis dysfunction that is noted when doing heel raise.  He has mild dorsal swelling over the first MTP and TMT joint.  No increased redness or warmth.  No palpable step-offs or deformity.  He has no other tenderness throughout the midfoot, hindfoot, forefoot.  He has longitudinal arch collapse as noted above, mild transverse arch collapse as well. Right foot with mild longitudinal arch collapse, minimal transverse arch collapse.  Posterior tibialis function is intact.  He has no tenderness to palpation throughout the midfoot, forefoot, hindfoot. Gait: Has significant increase dynamic overpronation on the left, minimal on the right.  Walking with an antalgic gait.  NEURO: sensation intact to light touch lower extremity bilaterally VASC: pulses 2+ and symmetric DP/PT bilaterally, no edema  IMAGING: none  Assessment & Plan Pain of joint of left ankle and foot Left medial foot pain x 6+ months with associated longitudinal arch collapse and posterior tibialis dysfunction.  May also have  component of underlying first MTP or TMT OA.  Plan:  1.  Reviewed PCP notes from visit 04/08/2023 with Dr. Oswaldo Done. 2.  Patient was fitted with a set of temporary orthotics in the office today.  Fitted with a pad sports insoles size 10-11 with medium size scaphoid pads for arch support on each orthotic, also given medial heel wedge on the left orthotic.  Reassessment of feet after inserting orthotics and shoes showed improved alignment.  He had improved gait with only minimal overpronation on the left.  He noted dramatic improvement in his pain after inserting orthotics in his shoes. 3.  Imaging: Left foot x-ray order placed to rule out osteoarthritis or other bony abnormalities 4.  Counseled to avoid walking barefoot.  She will try to wear some footwear at home with arch support whenever possible. 5.  Follow-up 4 weeks to reassess and see how he is  responding to the temporary orthotics.  We could consider custom orthotics in the future if needed. 6.  I will reach out with x-ray results when available 7.  Patient expressed understanding agreement, all questions were answered  Posterior tibial tendon dysfunction, left Left medial foot pain with arch collapse and associated posterior tibialis dysfunction  PLAN: Patient was fitted with a set of temporary orthotics in the office today.  Fitted with a pad sports insoles size 10-11 with medium size scaphoid pads for arch support on each orthotic, also given medial heel wedge on the left orthotic.  Reassessment of feet after inserting orthotics and shoes showed improved alignment.  He had improved gait with only minimal overpronation on the left.  He noted dramatic improvement in his pain after inserting orthotics in his shoes. 2.  Imaging: Left foot x-ray order placed to rule out osteoarthritis or other bony abnormalities 3.  Counseled to avoid walking barefoot.  She will try to wear some footwear at home with arch support whenever possible. 4.  Follow-up  4 weeks to reassess and see how he is responding to the temporary orthotics.  We could consider custom orthotics in the future if needed. 5.  I will reach out with x-ray results when available 6.  Patient expressed understanding agreement, all questions were answered  Encounter Diagnoses  Name Primary?   Pain of joint of left ankle and foot Yes   Posterior tibial tendon dysfunction, left     Orders Placed This Encounter  Procedures   DG Foot Complete Left    Orders Placed This Encounter  Procedures   DG Foot Complete Left

## 2023-04-18 NOTE — Patient Instructions (Signed)
You have left foot pain with some arch collapse - I fitted you with shoe inserts (sports insoles) with additional arch support - you should try to wear these in all of your shoes - you should try to limit walking around barefoot - try to wear shoes that have some arch support when possible - I placed order for a foot xray - I will reach out with the results when available - follow-up with me in 4 weeks to re-evaluate

## 2023-04-19 ENCOUNTER — Other Ambulatory Visit (HOSPITAL_COMMUNITY): Payer: Self-pay

## 2023-04-19 ENCOUNTER — Other Ambulatory Visit: Payer: Self-pay

## 2023-04-22 ENCOUNTER — Other Ambulatory Visit (HOSPITAL_COMMUNITY): Payer: Self-pay

## 2023-04-22 ENCOUNTER — Telehealth: Payer: Self-pay | Admitting: Student

## 2023-04-22 NOTE — Telephone Encounter (Signed)
Returned call. Joseph Ayala was concerned about dye used for MRI today given his allergy to iodinated contrast media. I reassured him that MRI dye is different from iodinated dye used for CT scan and that there is no need for pre-medication.  Marrianne Mood MD 04/22/2023, 8:06 PM

## 2023-04-22 NOTE — Telephone Encounter (Signed)
Received after hours page from [phone] in regards to patient and an MRI. Attempted to contact number x 2 without answer. Unable to leave voicemail since it is not set up at this time.

## 2023-04-23 ENCOUNTER — Encounter: Payer: Self-pay | Admitting: Student in an Organized Health Care Education/Training Program

## 2023-04-23 ENCOUNTER — Other Ambulatory Visit: Payer: Self-pay | Admitting: Student in an Organized Health Care Education/Training Program

## 2023-04-23 ENCOUNTER — Telehealth: Payer: Self-pay | Admitting: *Deleted

## 2023-04-23 ENCOUNTER — Other Ambulatory Visit (HOSPITAL_COMMUNITY): Payer: Self-pay

## 2023-04-23 MED ORDER — PREDNISONE 20 MG PO TABS: ORAL_TABLET | ORAL | 0 refills | Status: AC

## 2023-04-23 NOTE — Telephone Encounter (Signed)
Call from pt who stated he had MRI done yesterday; given IV dye contrast and now he's having hives-itching,redness. Stated this happened before and he was prescribed a Prednisone taper.

## 2023-04-23 NOTE — Telephone Encounter (Signed)
I spoke with the patient by phone. He is having local cutaneous urticaria. No other systemic symptoms, timing not consistent with anaphylaxis. No issues with breathing, he sounds well on the phone. I prescribed a short taper of prednisone, and he can use benadryl for local symptoms as well. He can come see [place] in clinic tomorrow if anything worsens.

## 2023-04-23 NOTE — Telephone Encounter (Signed)
Pt called back asking if the Dr had replied to his message  earlier ... I explained that the  triage Rn did send the message  to his PCP but I had also went ahead and made him an appt for tomorrow morning just incase the RN needed it  when I transferred  the call to triage .Marland Kitchen So if he does not get  call back to be here for 9:45 am to be  seen by a Dr  for his reaction to th dye

## 2023-04-24 ENCOUNTER — Encounter: Payer: Medicare Other | Admitting: Student

## 2023-04-24 ENCOUNTER — Other Ambulatory Visit (HOSPITAL_COMMUNITY): Payer: Self-pay

## 2023-04-24 ENCOUNTER — Other Ambulatory Visit: Payer: Medicare Other

## 2023-04-24 MED ORDER — BUPRENORPHINE HCL-NALOXONE HCL 8-2 MG SL FILM
1.0000 | ORAL_FILM | Freq: Three times a day (TID) | SUBLINGUAL | 1 refills | Status: DC
Start: 2023-04-24 — End: 2023-06-10
  Filled 2023-04-24 – 2023-05-02 (×2): qty 90, 30d supply, fill #0
  Filled 2023-05-30: qty 90, 30d supply, fill #1

## 2023-04-24 NOTE — Progress Notes (Deleted)
57 year old male with HTN, CAD, TAA, McArdle's, OUD MRI done 2 days ago and having hives, itching, and redness after contrast.  This has happened before and responded to prednisone taper Discussed over the phone with Dr. Oswaldo Done and found to have local cutaneous tachycardia without systemic symptoms and low concern for anaphylaxis.  Prescribed 6-day prednisone taper   Annual wellness, flu shot, colon cancer screening (FOBT 2022)

## 2023-04-24 NOTE — Telephone Encounter (Signed)
Pt stated he will here for the appt if he does not go to the ER.

## 2023-05-02 ENCOUNTER — Other Ambulatory Visit (HOSPITAL_COMMUNITY): Payer: Self-pay | Admitting: Psychiatry

## 2023-05-02 ENCOUNTER — Encounter (HOSPITAL_COMMUNITY): Payer: Medicare Other | Admitting: Psychiatry

## 2023-05-02 ENCOUNTER — Encounter (HOSPITAL_COMMUNITY): Payer: Self-pay

## 2023-05-02 ENCOUNTER — Other Ambulatory Visit (HOSPITAL_COMMUNITY): Payer: Self-pay

## 2023-05-02 DIAGNOSIS — F1994 Other psychoactive substance use, unspecified with psychoactive substance-induced mood disorder: Secondary | ICD-10-CM

## 2023-05-02 DIAGNOSIS — F902 Attention-deficit hyperactivity disorder, combined type: Secondary | ICD-10-CM

## 2023-05-02 DIAGNOSIS — F1990 Other psychoactive substance use, unspecified, uncomplicated: Secondary | ICD-10-CM

## 2023-05-02 DIAGNOSIS — F1120 Opioid dependence, uncomplicated: Secondary | ICD-10-CM

## 2023-05-02 NOTE — Progress Notes (Signed)
I called patient.  He is with his neurologist doing nerve conduction studies.  His wife told that appointment with neurology happen late.  We will reschedule the appointment.

## 2023-05-03 ENCOUNTER — Other Ambulatory Visit (HOSPITAL_COMMUNITY): Payer: Self-pay

## 2023-05-03 ENCOUNTER — Encounter (HOSPITAL_COMMUNITY): Payer: Self-pay

## 2023-05-03 ENCOUNTER — Telehealth (HOSPITAL_COMMUNITY): Payer: Self-pay | Admitting: *Deleted

## 2023-05-03 ENCOUNTER — Other Ambulatory Visit (HOSPITAL_COMMUNITY): Payer: Self-pay | Admitting: *Deleted

## 2023-05-03 DIAGNOSIS — F1120 Opioid dependence, uncomplicated: Secondary | ICD-10-CM

## 2023-05-03 DIAGNOSIS — F902 Attention-deficit hyperactivity disorder, combined type: Secondary | ICD-10-CM

## 2023-05-03 DIAGNOSIS — F1994 Other psychoactive substance use, unspecified with psychoactive substance-induced mood disorder: Secondary | ICD-10-CM

## 2023-05-03 DIAGNOSIS — F1990 Other psychoactive substance use, unspecified, uncomplicated: Secondary | ICD-10-CM

## 2023-05-03 MED ORDER — QUETIAPINE FUMARATE 300 MG PO TABS
300.0000 mg | ORAL_TABLET | Freq: Every day | ORAL | 0 refills | Status: DC
  Filled 2023-05-03 (×2): qty 30, 30d supply, fill #0

## 2023-05-03 NOTE — Telephone Encounter (Signed)
Pt LVM angrily insisting that he needs his Seroquel and says that we have not responded to his MyChart messages or calls. We have not received messages this week. Pt was scheduled for yesterday 05/02/23  with Dr. Lolly Mustache but says he had to reschedule. No f/u appointment scheduled. Writer did return pt call however pt mailbox has not been set up so not able to leave message. Per Dr. Lolly Mustache #30 of Seroquel 300 mg at bedtime sent to [facility] @  [address].

## 2023-05-07 ENCOUNTER — Other Ambulatory Visit (HOSPITAL_COMMUNITY): Payer: Self-pay

## 2023-05-08 ENCOUNTER — Ambulatory Visit
Admission: RE | Admit: 2023-05-08 | Discharge: 2023-05-08 | Disposition: A | Discharge status: Home / Self Care | Payer: Medicare Other | Source: Ambulatory Visit | Attending: Family Medicine | Admitting: Family Medicine

## 2023-05-08 DIAGNOSIS — M25572 Pain in left ankle and joints of left foot: Secondary | ICD-10-CM

## 2023-05-16 ENCOUNTER — Ambulatory Visit: Payer: Medicare Other | Admitting: Family Medicine

## 2023-05-23 ENCOUNTER — Other Ambulatory Visit: Payer: Self-pay | Admitting: Internal Medicine

## 2023-05-23 ENCOUNTER — Encounter: Payer: Self-pay | Admitting: Family Medicine

## 2023-05-23 ENCOUNTER — Other Ambulatory Visit: Payer: Self-pay

## 2023-05-23 ENCOUNTER — Other Ambulatory Visit (HOSPITAL_COMMUNITY): Payer: Self-pay

## 2023-05-23 ENCOUNTER — Ambulatory Visit (INDEPENDENT_AMBULATORY_CARE_PROVIDER_SITE_OTHER): Payer: Medicare Other | Admitting: Family Medicine

## 2023-05-23 VITALS — BP 124/86 | Ht 71.0 in | Wt 200.0 lb

## 2023-05-23 DIAGNOSIS — M25572 Pain in left ankle and joints of left foot: Secondary | ICD-10-CM | POA: Diagnosis present

## 2023-05-23 MED ORDER — METHYLPREDNISOLONE ACETATE 40 MG/ML IJ SUSP
20.0000 mg | Freq: Once | INTRAMUSCULAR | Status: AC
Start: 2023-05-23 — End: 2023-05-23
  Administered 2023-05-23: 20 mg via INTRA_ARTICULAR

## 2023-05-23 NOTE — Patient Instructions (Signed)
You were seen today for arthritis in your left foot. Continue the use of the green insole and minimize time barefoot or wearing shoes/flip flops without support. I recommend trying topical Voltaren gel every 6-8 hours on the sore spot of your foot. Let's follow-up again in 4 weeks to check in on how you are doing.  Today you received an injection with a corticosteroid (aka: cortisone injection). This injection is usually done in response to pain and inflammation. There is some "numbing medicine" (Lidocaine) in the shot, so the injected area may be numb and feel really good for the next couple of hours. The numbing medicine usually wears off in 2-3 hours, and then your pain level may be back to where it was before the injection until the cortisone starts working.    The actually benefit from the steroid injection is usually noticed within 3-5 days, but may take up to 14 days. You may actually experience a small (as in 10%) INCREASE in pain in the first 24 hours---that is common.  Things to watch out for that you should contact us or a health care provider urgently would include: 1. Unusual (as in more than 10%) increase in pain 2. New fever > 101.5 3. New swelling or redness of the injected area. 4. Streaking of red lines around the area injected.  Do not hesitate to call or reach out with any questions or concerns.

## 2023-05-23 NOTE — Assessment & Plan Note (Signed)
Ongoing for 7+ months with associated longitudinal arch collapse, posterior tibialis dysfunction, and TMT joint OA on my review of his x-rays.  Plan: Continue use of temporary orthotics with medium scaphoid pads bilaterally and a medial heel wedge in the left. May eventually benefit from transition to custom orthotics. Imaging: reviewed as above - evidence of TMT joint OA in the left foot. Recommended cortisone injection of the TMT joint under US guidance which was performed as below. Tolerated well.  Reviewed use of Voltaren gel prn over the TMT joint as well as the medial ankle/posterior tibialis tendon track Counselled on continued avoidance/minimization of barefoot walking or shoes without arch support. We discussed some sandal options with arch support such as Birkenstocks. Follow-up 4 weeks to reassess and see how he is responding to the injection. If failing to improve would recommend MRI to evaluate further and inquire about integrity of his posterior tibialis tendon, especially considering his history of McArdles. Patient's questions were answered and they are in agreement with this plan.

## 2023-05-23 NOTE — Progress Notes (Signed)
PCP: [name], MD  SUBJECTIVE:   HPI:  Patient is a 57 y.o. male here for follow-up on his left foot pain. He was seen last month and provided hapad inserts with scaphoid pads and a medial heel wedge in the left insert for his posterior tibialis dysfunction.   In the interim he has had continued pain in the dorsal left midfoot. He hasn't noticed much improvement with the insert. He went for XR of the foot following last visit. He states his pain is limiting his activity. The pain worsens with WB and ambulation, similar to previous. No new concerns.  ROS:     See HPI  PERTINENT  PMH / PSH FH / / SH:  Past Medical, Surgical, Social, and Family History Reviewed & Updated in the EMR.  Pertinent findings include:  Opioid use d/o McArdles's syndrome (following with Neurology at [facility]) HTN Low testosterone on testosterone replacement therapy  Allergies  Allergen Reactions   Ivp Dye [Iodinated Contrast Media] Hives   Lyrica Cr [Pregabalin Er] Other (See Comments)    Memory loss   Vicodin [Hydrocodone-Acetaminophen] Nausea And Vomiting   Quinolones Other (See Comments)    Aortic Aneurysm   OBJECTIVE:  BP 124/86   Ht 5\' 11"  (1.803 m)   Wt 200 lb (90.7 kg)   BMI 27.89 kg/m   PHYSICAL EXAM:  GEN: Alert and Oriented, NAD, comfortable in exam room RESP: Unlabored respirations, symmetric chest rise PSY: normal mood, congruent affect   Foot MSK EXAM: Moderate longitudinal arch collapse on the left, mild on the right. Mild transverse arch collapse bilaterally.  Some swelling and bony prominence of the left TMT joint without overlying skin changes. ROM is full TTP primarily over left TMT joint dorsally and on the plantar aspect. Mild TTP along posterior tibialis tendon track.  NVI distally.   IMAGING: X-rays 3 views left foot personally reviewed and show osteophyte formation and degenerative changes of the left TMT joint.  Otherwise alignment is normal and there  are no other acute osseous abnormalities noted.  Formal radiology read is pending at this time.  Assessment & Plan Pain of joint of left ankle and foot Ongoing for 7+ months with associated longitudinal arch collapse, posterior tibialis dysfunction, and TMT joint OA on my review of his x-rays.  Plan: Continue use of temporary orthotics with medium scaphoid pads bilaterally and a medial heel wedge in the left. May eventually benefit from transition to custom orthotics. Imaging: reviewed as above - evidence of TMT joint OA in the left foot. Recommended cortisone injection of the TMT joint under US guidance which was performed as below. Tolerated well.  Reviewed use of Voltaren gel prn over the TMT joint as well as the medial ankle/posterior tibialis tendon track Counselled on continued avoidance/minimization of barefoot walking or shoes without arch support. We discussed some sandal options with arch support such as Birkenstocks. Follow-up 4 weeks to reassess and see how he is responding to the injection. If failing to improve would recommend MRI to evaluate further and inquire about integrity of his posterior tibialis tendon, especially considering his history of McArdles. Patient's questions were answered and they are in agreement with this plan.   US-Guided Left TMT Joint Cortisone injection:  After discussion on risks/benefits/alternatives, informed verbal and written consent was obtained. A timeout was then performed confirming correct patient, procedure, and site. Patient was seated on table in exam room in supine position with left knee flexed to 90d and left  foot resting flat on table. The patient's foot was prepped with chlorhexidine solution and utilizing out of plane approach and 25g 5/8in needle with ultrasound guidance, the patient's left TMT joint was injected with 0.5:0.5 mixture of lidocaine:depomedrol. Patient tolerated the procedure well without immediate complications. The patient  was counseled as to the expected post-injection course, including the possibility of worsening of pain with steroid flare. Instructed as to concerning symptoms and advised to contact the office if these should arise.   Glean Salen, MD PGY-4, Sports Medicine Fellow [facility]  Addendum:  Patient seen and examined in the office with fellow.   History, exam, imaging, plan of care were precepted with me.  I was present and assisted with procedure.  Agree with findings as documented in fellow note with additions as noted above.  Darene Lamer, DO, CAQSM

## 2023-05-24 ENCOUNTER — Other Ambulatory Visit (HOSPITAL_COMMUNITY): Payer: Self-pay

## 2023-05-24 MED ORDER — ALBUTEROL SULFATE HFA 108 (90 BASE) MCG/ACT IN AERS
2.0000 | INHALATION_SPRAY | Freq: Four times a day (QID) | RESPIRATORY_TRACT | 2 refills | Status: DC | PRN
  Filled 2023-05-24: qty 6.7, 25d supply, fill #0

## 2023-05-30 ENCOUNTER — Other Ambulatory Visit: Payer: Self-pay

## 2023-05-30 ENCOUNTER — Other Ambulatory Visit (HOSPITAL_COMMUNITY): Payer: Self-pay

## 2023-05-31 ENCOUNTER — Other Ambulatory Visit (HOSPITAL_COMMUNITY): Payer: Self-pay

## 2023-06-10 ENCOUNTER — Encounter: Payer: Self-pay | Admitting: Student in an Organized Health Care Education/Training Program

## 2023-06-10 ENCOUNTER — Other Ambulatory Visit (HOSPITAL_COMMUNITY): Payer: Self-pay

## 2023-06-10 ENCOUNTER — Ambulatory Visit (INDEPENDENT_AMBULATORY_CARE_PROVIDER_SITE_OTHER): Payer: Medicare Other | Admitting: Student in an Organized Health Care Education/Training Program

## 2023-06-10 VITALS — BP 124/98 | HR 74 | Ht 71.0 in | Wt 204.8 lb

## 2023-06-10 DIAGNOSIS — R7989 Other specified abnormal findings of blood chemistry: Secondary | ICD-10-CM

## 2023-06-10 DIAGNOSIS — F1190 Opioid use, unspecified, uncomplicated: Secondary | ICD-10-CM | POA: Diagnosis not present

## 2023-06-10 DIAGNOSIS — F1994 Other psychoactive substance use, unspecified with psychoactive substance-induced mood disorder: Secondary | ICD-10-CM | POA: Diagnosis present

## 2023-06-10 DIAGNOSIS — N529 Male erectile dysfunction, unspecified: Secondary | ICD-10-CM

## 2023-06-10 MED ORDER — TADALAFIL 10 MG PO TABS
10.0000 mg | ORAL_TABLET | ORAL | 1 refills | Status: DC | PRN
  Filled 2023-06-10: qty 20, 20d supply, fill #0
  Filled 2023-06-18: qty 20, 20d supply, fill #1

## 2023-06-10 MED ORDER — BUPRENORPHINE HCL-NALOXONE HCL 8-2 MG SL FILM
1.0000 | ORAL_FILM | Freq: Three times a day (TID) | SUBLINGUAL | 1 refills | Status: DC
  Filled 2023-06-10 – 2023-06-27 (×5): qty 90, 30d supply, fill #0
  Filled 2023-07-25: qty 90, 30d supply, fill #1

## 2023-06-10 MED ORDER — TESTOSTERONE CYPIONATE 200 MG/ML IM SOLN
200.0000 mg | INTRAMUSCULAR | 1 refills | Status: DC
  Filled 2023-06-10 – 2023-07-01 (×3): qty 10, 70d supply, fill #0
  Filled 2023-08-15 – 2023-09-11 (×2): qty 10, 70d supply, fill #1

## 2023-06-10 NOTE — Assessment & Plan Note (Signed)
Chronic issue of low testosterone on testosterone replacement therapy, but it has been very difficult to get his testosterone levels back into a normal range.  Currently he is using 400 mg IM injections every 2 weeks.  Last dose was 8 days ago, so we will check a testosterone level today.  If he still below expected level we talked about switching to 200 mg every 7-day injection as he might be rapidly metabolizing.  Also offered him referral to endocrinology in case he needs a higher dose.

## 2023-06-10 NOTE — Assessment & Plan Note (Signed)
Chronic and stable issue.  Good adherence with Suboxone 8 mg 3 times daily.  Checking urine tox assure today.  I reviewed the database which was appropriate.  Refilled Suboxone for 80-month supply.  Follow-up with me in 2 months.

## 2023-06-10 NOTE — Assessment & Plan Note (Signed)
Chronic and stable, much improved now that he is stable on treatment with Suboxone.  He took himself off of Seroquel over the last few weeks because of increased sedation.  I encouraged him to continue using duloxetine 30 mg twice daily for issues with mood disorder and his chronic neuropathy.  Also encouraged him to follow-up with psychiatry, especially now that he has come off of the Seroquel.

## 2023-06-10 NOTE — Assessment & Plan Note (Signed)
Currently uncontrolled problem, did not respond to sildenafil 100 mg, was using it on an empty stomach.  We will try Cialis 10 mg daily as needed.

## 2023-06-10 NOTE — Progress Notes (Signed)
Established Patient Office Visit  Subjective   Patient ID: Joseph Ayala, male    DOB: 1965/11/25  Age: 57 y.o. MRN: [id number]  Chief Complaint  Patient presents with   Back Pain   foot Pain    HPI  57 year old person here for follow-up of opioid use disorder.  Doing very well since the last time I saw him.  Reports good adherence with medications.  Denies any adverse side effects.  He has multiple subspecialists managing some of his difficult problems at this point, did follow-up with neurology at [facility] and had EMG and nerve conduction studies completed.  An MRI of the brain and cervical spine.  Left foot osteoarthritis being managed at sports medicine, doing much better since steroid injection and use of orthotics.  Mood is stable, reports appropriate mood, no depression, no anxiety.  He is taking a trip to the [place] in 2 weeks with family which she is looking forward to.  No recent emergency department visits or hospitalizations.  Fully functional and independent in activities of daily living.    Objective:     BP (!) 124/98 (BP Location: Right Arm, Patient Position: Sitting, Cuff Size: Small)   Pulse 74   Ht 5\' 11"  (1.803 m)   Wt 204 lb 12.8 oz (92.9 kg)   SpO2 100%   BMI 28.56 kg/m    Physical Exam  Gen: Well-appearing man, no distress Neck: No lymphadenopathy, well-healed tracheostomy scar, normal thyroid CV: Regular rate and rhythm with no murmurs Lungs: Clear to auscultation throughout Extremities: Warm well-perfused with no lower extremity edema, normal muscle bulk and tone Neuro: Alert, conversational, full strength upper and lower extremities, normal gait Psych: Appropriate mood and affect, not depressed or anxious appearing    Assessment & Plan:   Problem List Items Addressed This Visit       High   Opioid use disorder (Chronic)    Chronic and stable issue.  Good adherence with Suboxone 8 mg 3 times daily.  Checking urine tox assure today.  I  reviewed the database which was appropriate.  Refilled Suboxone for 14-month supply.  Follow-up with me in 55 months.      Relevant Medications   Buprenorphine HCl-Naloxone HCl 8-2 MG FILM   Other Relevant Orders   ToxAssure Select,+Antidepr,UR   Substance induced mood disorder (HCC) - Primary (Chronic)    Chronic and stable, much improved now that he is stable on treatment with Suboxone.  He took himself off of Seroquel over the last few weeks because of increased sedation.  I encouraged him to continue using duloxetine 30 mg twice daily for issues with mood disorder and his chronic neuropathy.  Also encouraged him to follow-up with psychiatry, especially now that he has come off of the Seroquel.        Low   Low testosterone (Chronic)    Chronic issue of low testosterone on testosterone replacement therapy, but it has been very difficult to get his testosterone levels back into a normal range.  Currently he is using 400 mg IM injections every 2 weeks.  Last dose was 8 days ago, so we will check a testosterone level today.  If he still below expected level we talked about switching to 200 mg every 7-day injection as he might be rapidly metabolizing.  Also offered him referral to endocrinology in case he needs a higher dose.      Relevant Medications   testosterone cypionate (DEPOTESTOSTERONE CYPIONATE) 200 MG/ML injection  Other Relevant Orders   Testosterone, Total   Erectile dysfunction (Chronic)    Currently uncontrolled problem, did not respond to sildenafil 100 mg, was using it on an empty stomach.  We will try Cialis 10 mg daily as needed.      Relevant Medications   tadalafil (CIALIS) 10 MG tablet    Return in about 2 months (around 08/11/2023).    [name], MD

## 2023-06-11 ENCOUNTER — Other Ambulatory Visit (HOSPITAL_COMMUNITY): Payer: Self-pay

## 2023-06-11 LAB — TESTOSTERONE: Testosterone: 453 ng/dL (ref 264–916)

## 2023-06-12 LAB — TOXASSURE SELECT,+ANTIDEPR,UR

## 2023-06-13 NOTE — Addendum Note (Signed)
Addended by: Erlinda Hong T on: 06/13/2023 04:31 PM   Modules accepted: Orders

## 2023-06-18 ENCOUNTER — Other Ambulatory Visit (HOSPITAL_COMMUNITY): Payer: Self-pay | Admitting: Psychiatry

## 2023-06-18 ENCOUNTER — Other Ambulatory Visit (HOSPITAL_COMMUNITY): Payer: Self-pay

## 2023-06-18 ENCOUNTER — Other Ambulatory Visit: Payer: Self-pay

## 2023-06-18 DIAGNOSIS — F1994 Other psychoactive substance use, unspecified with psychoactive substance-induced mood disorder: Secondary | ICD-10-CM

## 2023-06-18 DIAGNOSIS — F1120 Opioid dependence, uncomplicated: Secondary | ICD-10-CM

## 2023-06-18 DIAGNOSIS — F1990 Other psychoactive substance use, unspecified, uncomplicated: Secondary | ICD-10-CM

## 2023-06-18 DIAGNOSIS — F902 Attention-deficit hyperactivity disorder, combined type: Secondary | ICD-10-CM

## 2023-06-19 ENCOUNTER — Encounter (HOSPITAL_COMMUNITY): Payer: Self-pay

## 2023-06-19 ENCOUNTER — Other Ambulatory Visit (HOSPITAL_COMMUNITY): Payer: Self-pay

## 2023-06-20 ENCOUNTER — Ambulatory Visit (INDEPENDENT_AMBULATORY_CARE_PROVIDER_SITE_OTHER): Payer: Medicare Other | Admitting: Family Medicine

## 2023-06-20 ENCOUNTER — Encounter: Payer: Self-pay | Admitting: Family Medicine

## 2023-06-20 ENCOUNTER — Other Ambulatory Visit (HOSPITAL_COMMUNITY): Payer: Self-pay | Admitting: Psychiatry

## 2023-06-20 ENCOUNTER — Other Ambulatory Visit (HOSPITAL_COMMUNITY): Payer: Self-pay

## 2023-06-20 VITALS — BP 122/82 | Ht 71.0 in | Wt 200.0 lb

## 2023-06-20 DIAGNOSIS — M25572 Pain in left ankle and joints of left foot: Secondary | ICD-10-CM | POA: Diagnosis present

## 2023-06-20 DIAGNOSIS — F1120 Opioid dependence, uncomplicated: Secondary | ICD-10-CM

## 2023-06-20 DIAGNOSIS — F1994 Other psychoactive substance use, unspecified with psychoactive substance-induced mood disorder: Secondary | ICD-10-CM

## 2023-06-20 DIAGNOSIS — F902 Attention-deficit hyperactivity disorder, combined type: Secondary | ICD-10-CM

## 2023-06-20 DIAGNOSIS — F1990 Other psychoactive substance use, unspecified, uncomplicated: Secondary | ICD-10-CM

## 2023-06-20 NOTE — Progress Notes (Signed)
DATE OF VISIT: 06/20/2023        Joseph Ayala DOB: 1965-11-04 MRN: [id number]  CC:  f/u LT foot pain  History of present Illness: Joseph Ayala is a 57 y.o. male who presents for a follow-up visit for foot pain Last seen by me 05/23/23 and underwent ultrasound-guided left TMT joint cortisone injection Today patient reports he has had improvement in pain Has continued to use temporary orthotics Would be interested in custom orthotics  Medications:  Outpatient Encounter Medications as of 06/20/2023  Medication Sig   amLODipine (NORVASC) 5 MG tablet Take 1 tablet (5 mg total) by mouth daily.   Buprenorphine HCl-Naloxone HCl 8-2 MG FILM Place 1 Film under the tongue in the morning, at noon, and at bedtime.   chlorthalidone (HYGROTON) 25 MG tablet Take 1 tablet (25 mg total) by mouth daily.   diclofenac Sodium (VOLTAREN) 1 % GEL Apply 1 gram topically 4 (four) times a day.   DULoxetine (CYMBALTA) 30 MG capsule Take 1 capsule (30 mg total) by mouth 2 (two) times daily.   gabapentin (NEURONTIN) 300 MG capsule Take 3 capsules (900 mg total) by mouth 3 (three) times daily for Neuropathy   metoprolol succinate (TOPROL-XL) 50 MG 24 hr tablet Take 1 tablet (50 mg total) by mouth daily. Take with or immediately following a meal.   tadalafil (CIALIS) 10 MG tablet Take 1 tablet (10 mg total) by mouth as needed for erectile dysfunction.   testosterone cypionate (DEPOTESTOSTERONE CYPIONATE) 200 MG/ML injection Inject 1 mL (200 mg total) into the muscle every 7 (seven) days.   No facility-administered encounter medications on file as of 06/20/2023.    Allergies: is allergic to ivp dye [iodinated contrast media], lyrica cr [pregabalin er], vicodin [hydrocodone-acetaminophen], and quinolones.  Physical Examination: Vitals: BP 122/82   Ht 5\' 11"  (1.803 m)   Wt 200 lb (90.7 kg)   BMI 27.89 kg/m  GENERAL:  Joseph Ayala is a 57 y.o. male appearing their stated age, alert and oriented x 3, in no  apparent distress.  MSK: Left foot without any swelling.  He continues to have moderate longitudinal arch collapse.  Also transverse arch collapse.  He has no tenderness palpation along the midfoot or forefoot today.  He is noted to have hyperkeratotic nails. He is neurovascularly intact distally  Assessment & Plan Pain of joint of left ankle and foot Left foot pain status post TMT ultrasound-guided injection 05/23/2023 with good response, has been using temporary orthotics  Plan: -Has had good response to cortisone injection at this time -Would be candidate for custom orthotics for improved stability of the foot.  He would like to proceed with this.  Cost reviewed.  He will schedule follow-up at his convenience -Continue to use temporary orthotics -Continue to wear supportive shoes -Follow-up for custom orthotics at his convenience   Patient expressed understanding & agreement with above.  Encounter Diagnosis  Name Primary?   Pain of joint of left ankle and foot Yes    No orders of the defined types were placed in this encounter.

## 2023-06-20 NOTE — Assessment & Plan Note (Signed)
Left foot pain status post TMT ultrasound-guided injection 05/23/2023 with good response, has been using temporary orthotics  Plan: -Has had good response to cortisone injection at this time -Would be candidate for custom orthotics for improved stability of the foot.  He would like to proceed with this.  Cost reviewed.  He will schedule follow-up at his convenience -Continue to use temporary orthotics -Continue to wear supportive shoes -Follow-up for custom orthotics at his convenience

## 2023-06-25 ENCOUNTER — Other Ambulatory Visit (HOSPITAL_COMMUNITY): Payer: Self-pay

## 2023-06-25 ENCOUNTER — Encounter: Payer: Self-pay | Admitting: Family Medicine

## 2023-06-25 ENCOUNTER — Ambulatory Visit (INDEPENDENT_AMBULATORY_CARE_PROVIDER_SITE_OTHER): Payer: Medicare Other | Admitting: Family Medicine

## 2023-06-25 VITALS — BP 112/82 | Ht 71.0 in | Wt 200.0 lb

## 2023-06-25 DIAGNOSIS — M25572 Pain in left ankle and joints of left foot: Secondary | ICD-10-CM | POA: Diagnosis present

## 2023-06-25 DIAGNOSIS — M19072 Primary osteoarthritis, left ankle and foot: Secondary | ICD-10-CM | POA: Diagnosis not present

## 2023-06-25 DIAGNOSIS — R269 Unspecified abnormalities of gait and mobility: Secondary | ICD-10-CM

## 2023-06-25 NOTE — Progress Notes (Signed)
DATE OF VISIT: 06/25/2023        Joseph Ayala DOB: 1966-07-09 MRN: [id number]  CC:  custom orthotics  History of present Illness: Joseph Ayala is a 57 y.o. male who presents for a follow-up visit for custom orthotics Has foot OA s/p LT TMT injection Has been wearing sports insoles with scaphoid pads and heel wedge on the left Would like custom orthotics today  Medications:  Outpatient Encounter Medications as of 06/25/2023  Medication Sig   amLODipine (NORVASC) 5 MG tablet Take 1 tablet (5 mg total) by mouth daily.   Buprenorphine HCl-Naloxone HCl 8-2 MG FILM Place 1 Film under the tongue in the morning, at noon, and at bedtime.   chlorthalidone (HYGROTON) 25 MG tablet Take 1 tablet (25 mg total) by mouth daily.   diclofenac Sodium (VOLTAREN) 1 % GEL Apply 1 gram topically 4 (four) times a day.   gabapentin (NEURONTIN) 300 MG capsule Take 3 capsules (900 mg total) by mouth 3 (three) times daily for Neuropathy   metoprolol succinate (TOPROL-XL) 50 MG 24 hr tablet Take 1 tablet (50 mg total) by mouth daily. Take with or immediately following a meal.   tadalafil (CIALIS) 10 MG tablet Take 1 tablet (10 mg total) by mouth as needed for erectile dysfunction.   testosterone cypionate (DEPOTESTOSTERONE CYPIONATE) 200 MG/ML injection Inject 1 mL (200 mg total) into the muscle every 7 (seven) days.   No facility-administered encounter medications on file as of 06/25/2023.    Allergies: is allergic to ivp dye [iodinated contrast media], lyrica cr [pregabalin er], vicodin [hydrocodone-acetaminophen], and quinolones.  Physical Examination: Vitals: BP 112/82   Ht 5\' 11"  (1.803 m)   Wt 200 lb (90.7 kg)   BMI 27.89 kg/m  GENERAL:  Joseph Ayala is a 56 y.o. male appearing their stated age, alert and oriented x 3, in no apparent distress.  SKIN: no rashes or lesions, skin clean, dry, intact MSK: Bilateral feet with Morton's toe.  He has overpronation on the left.  Has moderate longitudinal  arch collapse bilaterally, left greater than right.  Mild transverse arch collapse bilaterally.  Bony tenderness.  Does have hyperkeratotic nails. Neurovascular intact distally  Assessment & Plan Pain of joint of left ankle and foot Left foot and ankle pain with mild to moderate midfoot OA, responded well to TMT ultrasound-guided injection 05/23/2023.  Has been doing well with temporary orthotics, would like custom orthotics  Plan: -Custom orthotics created in the office today  ORTHOTICS PREP Patient was fitted for a : standard, cushioned, semi-rigid orthotic. The orthotic was heated, placed on the orthotic stand. The patient was positioned in subtalar neutral position and 10 degrees of ankle dorsiflexion in a weight bearing stance on the heated orthotic blank After completion of molding, a stable base was applied to the orthotic blank. The blank was ground to a stable position for weight bearing. Blank: size 11 Base:blue EVA foam Posting: left medial heel wedge   Orthotics were comfortable in the office today.  He had more neutral gait. -Can follow-up as needed for any adjustments -Follow-up 2 to 3 months for reevaluation, sooner as needed Abnormality of gait Overpronation with associated left midfoot OA  Plan: -Custom orthotics fabricated as noted above Osteoarthritis of midfoot, left Overpronation with associated left midfoot OA  Plan: -Custom orthotics fabricated as noted above   Patient expressed understanding & agreement with above.  Encounter Diagnoses  Name Primary?   Pain of joint of left ankle and foot Yes  Abnormality of gait    Osteoarthritis of midfoot, left     No orders of the defined types were placed in this encounter.

## 2023-06-25 NOTE — Assessment & Plan Note (Signed)
Left foot and ankle pain with mild to moderate midfoot OA, responded well to TMT ultrasound-guided injection 05/23/2023.  Has been doing well with temporary orthotics, would like custom orthotics  Plan: -Custom orthotics created in the office today  ORTHOTICS PREP Patient was fitted for a : standard, cushioned, semi-rigid orthotic. The orthotic was heated, placed on the orthotic stand. The patient was positioned in subtalar neutral position and 10 degrees of ankle dorsiflexion in a weight bearing stance on the heated orthotic blank After completion of molding, a stable base was applied to the orthotic blank. The blank was ground to a stable position for weight bearing. Blank: size 11 Base:blue EVA foam Posting: left medial heel wedge   Orthotics were comfortable in the office today.  He had more neutral gait. -Can follow-up as needed for any adjustments -Follow-up 2 to 3 months for reevaluation, sooner as needed

## 2023-06-25 NOTE — Patient Instructions (Signed)
 Thank you for coming to see me today. It was a pleasure.   We made you new orthotics. Let us know if we need to add any extra cushioning or make changes.  If you have any questions or concerns, please do not hesitate to call the office at [phone].

## 2023-06-25 NOTE — Assessment & Plan Note (Signed)
Overpronation with associated left midfoot OA  Plan: -Custom orthotics fabricated as noted above

## 2023-06-27 ENCOUNTER — Other Ambulatory Visit (HOSPITAL_COMMUNITY): Payer: Self-pay

## 2023-07-01 ENCOUNTER — Other Ambulatory Visit (HOSPITAL_COMMUNITY): Payer: Self-pay

## 2023-07-04 ENCOUNTER — Encounter: Payer: Self-pay | Admitting: Student in an Organized Health Care Education/Training Program

## 2023-07-05 ENCOUNTER — Other Ambulatory Visit: Payer: Self-pay | Admitting: Internal Medicine

## 2023-07-05 ENCOUNTER — Other Ambulatory Visit (HOSPITAL_COMMUNITY): Payer: Self-pay

## 2023-07-05 MED ORDER — PREDNISONE 20 MG PO TABS
ORAL_TABLET | ORAL | 0 refills | Status: AC
  Filled 2023-07-05: qty 11, 10d supply, fill #0

## 2023-07-05 NOTE — Telephone Encounter (Signed)
Pt stated he uses [facility]. And he agreed to schedule an appt - appt schedule w/ Dr Allena Katz on 07/23/23.

## 2023-07-19 ENCOUNTER — Other Ambulatory Visit (HOSPITAL_COMMUNITY): Payer: Self-pay

## 2023-07-19 MED ORDER — PREDNISONE 20 MG PO TABS
ORAL_TABLET | ORAL | 0 refills | Status: DC
  Filled 2023-07-19: qty 11, 10d supply, fill #0

## 2023-07-23 ENCOUNTER — Telehealth: Payer: Self-pay | Admitting: *Deleted

## 2023-07-23 ENCOUNTER — Telehealth: Payer: Self-pay

## 2023-07-23 ENCOUNTER — Encounter: Payer: Medicare HMO | Admitting: Student

## 2023-07-23 ENCOUNTER — Ambulatory Visit: Payer: Medicare HMO | Admitting: Internal Medicine

## 2023-07-23 ENCOUNTER — Other Ambulatory Visit (HOSPITAL_COMMUNITY): Payer: Self-pay

## 2023-07-23 VITALS — BP 136/80 | HR 85 | Temp 98.1°F | Ht 71.0 in | Wt 199.4 lb

## 2023-07-23 DIAGNOSIS — J069 Acute upper respiratory infection, unspecified: Secondary | ICD-10-CM

## 2023-07-23 DIAGNOSIS — J101 Influenza due to other identified influenza virus with other respiratory manifestations: Secondary | ICD-10-CM | POA: Diagnosis not present

## 2023-07-23 LAB — RESP PANEL BY RT-PCR (RSV, FLU A&B, COVID)  RVPGX2
Influenza A by PCR: POSITIVE — AB
Influenza B by PCR: NEGATIVE
Resp Syncytial Virus by PCR: NEGATIVE
SARS Coronavirus 2 by RT PCR: NEGATIVE

## 2023-07-23 NOTE — Patient Instructions (Signed)
 Thank you, [name] for allowing us  to provide your care today.   Chest congestion/ fevers I am testing for covid, flu and RSV. I will call once results are back.  If you do not feel like you are getting better within a few days give the office a call back and I will send in an antibiotic.  I have ordered the following labs for you:  Lab Orders         Resp panel by RT-PCR (RSV, Flu A&B, Covid) Anterior Nasal Swab       I have ordered the following medication/changed the following medications:   Stop the following medications: Medications Discontinued During This Encounter  Medication Reason   predniSONE  (DELTASONE ) 20 MG tablet      Start the following medications: No orders of the defined types were placed in this encounter.   We look forward to seeing you next time. Please call our clinic at [phone] if you have any questions or concerns. The best time to call is Monday-Friday from 9am-4pm, but there is someone available 24/7. If after hours or the weekend, call the main hospital number and ask for the Internal Medicine Resident On-Call. If you need medication refills, please notify your pharmacy one week in advance and they will send us  a request.   Thank you for trusting me with your care. Wishing you the best!   Joseph Medley, DO [facility]

## 2023-07-23 NOTE — Telephone Encounter (Signed)
 Yes, of course. Please let him know that I would be happy to continue to care for him.

## 2023-07-23 NOTE — Telephone Encounter (Signed)
 Hey Dr.Vincent, Joseph Ayala is wondering if you will be able to continue to see him as a patient and treat him for OUD at your new location. Patient is requesting a call back he also stated you can reach him on my chart if he does not answer.

## 2023-07-23 NOTE — Telephone Encounter (Signed)
 Call from patient states has cough,congestion and fever.  Has a history of past pneumonia that left him in a coma.  Patient was given an appointment for 10:45 AM today.

## 2023-07-23 NOTE — Telephone Encounter (Signed)
 I called the patient, unable to reach him, his voicemail is not set up. I will send him a message on My Chart!

## 2023-07-23 NOTE — Progress Notes (Signed)
 Subjective:  CC: acute visit  HPI:  Joseph Ayala is a 58 y.o. male with a past medical history of MRSA pneumonia in 2022 and presents today for cough, fever, and malaise.   Please see problem based assessment and plan for additional details.  Past Medical History:  Diagnosis Date   Acute HFmrEF (heart failure with mildly reduced ejection fraction) (HCC) 10/31/2020   Acute hypoxemic respiratory failure (HCC)    Acute respiratory failure with hypoxia (HCC) 08/28/2020   CAP (community acquired pneumonia) 08/28/2020   Degenerative disc disease, lumbar    Dental crown present    ETOH abuse    History of kidney stones    Hypertension    states under control with meds., has been on med. x 1 yr. - is currently out of med., to see PCP 02/06/2018   Marijuana use, continuous    MRSA pneumonia (HCC) 09/09/2020   Muscle atrophy 01/2018   Muscle weakness 01/2018   Necrotizing myopathy    Necrotizing pneumonia (HCC) 09/09/2020   Neuropathic pain    Opioid use disorder    PAF (paroxysmal atrial fibrillation) (HCC)    Pleural effusion    Sepsis (HCC) 08/28/2020   Thoracic aortic aneurysm (TAA) (HCC)    a. seen on CT 08/2020.    Current Outpatient Medications on File Prior to Visit  Medication Sig Dispense Refill   amLODipine  (NORVASC ) 5 MG tablet Take 1 tablet (5 mg total) by mouth daily. 90 tablet 2   Buprenorphine  HCl-Naloxone  HCl 8-2 MG FILM Place 1 Film under the tongue in the morning, at noon, and at bedtime. 90 each 1   chlorthalidone  (HYGROTON ) 25 MG tablet Take 1 tablet (25 mg total) by mouth daily. 90 tablet 3   diclofenac  Sodium (VOLTAREN ) 1 % GEL Apply 1 gram topically 4 (four) times a day. 200 g 3   gabapentin  (NEURONTIN ) 300 MG capsule Take 3 capsules (900 mg total) by mouth 3 (three) times daily for Neuropathy 270 capsule 1   metoprolol  succinate (TOPROL -XL) 50 MG 24 hr tablet Take 1 tablet (50 mg total) by mouth daily. Take with or immediately following a meal. 90 tablet 3    tadalafil  (CIALIS ) 10 MG tablet Take 1 tablet (10 mg total) by mouth as needed for erectile dysfunction. 20 tablet 1   testosterone  cypionate (DEPOTESTOSTERONE CYPIONATE) 200 MG/ML injection Inject 1 mL (200 mg total) into the muscle every 7 (seven) days. 10 mL 1   No current facility-administered medications on file prior to visit.    Family History  Problem Relation Age of Onset   Varicose Veins Brother    Drug abuse Brother     Social History   Socioeconomic History   Marital status: Married    Spouse name: Not on file   Number of children: 1   Years of education: 13   Highest education level: Some college, no degree  Occupational History   Occupation: not working  Tobacco Use   Smoking status: Never   Smokeless tobacco: Former  Building Services Engineer status: Never Used  Substance and Sexual Activity   Alcohol  use: Not Currently    Comment: occasionally   Drug use: No   Sexual activity: Yes    Partners: Female    Birth control/protection: None  Other Topics Concern   Not on file  Social History Narrative   Lives with wife and daughter in a one story home.  Has one child.  Currently not working.  Education: some college.    Social Drivers of Corporate Investment Banker Strain: Low Risk  (03/19/2022)   Overall Financial Resource Strain (CARDIA)    Difficulty of Paying Living Expenses: Not hard at all  Food Insecurity: No Food Insecurity (03/19/2022)   Hunger Vital Sign    Worried About Running Out of Food in the Last Year: Never true    Ran Out of Food in the Last Year: Never true  Transportation Needs: No Transportation Needs (03/27/2022)   PRAPARE - Administrator, Civil Service (Medical): No    Lack of Transportation (Non-Medical): No  Physical Activity: Inactive (03/19/2022)   Exercise Vital Sign    Days of Exercise per Week: 0 days    Minutes of Exercise per Session: 0 min  Stress: No Stress Concern Present (03/19/2022)   [employer] of  Occupational Health - Occupational Stress Questionnaire    Feeling of Stress : Not at all  Social Connections: Unknown (09/05/2022)   Received from [facility], [facility]   Social Network    Social Network: Not on file  Intimate Partner Violence: Unknown (09/05/2022)   Received from [facility], [facility]   HITS    Physically Hurt: Not on file    Insult or Talk Down To: Not on file    Threaten Physical Harm: Not on file    Scream or Curse: Not on file    Review of Systems: ROS negative except for what is noted on the assessment and plan.  Objective:   Vitals:   07/23/23 1115  BP: 136/80  Pulse: 85  Temp: 98.1 F (36.7 C)  TempSrc: Oral  SpO2: 100%  Weight: 199 lb 6.4 oz (90.4 kg)  Height: 5' 11 (1.803 m)    Physical Exam: Constitutional: appears fatigued HENT: no effusion of bilateral TM, no erythema within ear canal, pharyngeal erythema  Cardiovascular: regular rate and rhythm, no m/r/g Pulmonary/Chest: normal work of breathing on room air, lungs clear to auscultation bilaterally Abdominal: soft, non-tender, non-distended MSK: normal bulk and tone Skin: warm and dry  Assessment & Plan:  Influenza A He started to feel fatigued with productive cough on Friday. Over the weekend he developed an elevated temperature to 100.16F. He thinks he was exposed when he was on air plane coming back from vacation last week or from his daughter.  He is tolerating fluids and taking robitussin with some improvement in symptoms.  P: Flu A +, he is outside window for tamiflu to be helpful. I called and reviewed results with him. He will continue to quarantine away from his wife the next few days.    Patient discussed with Dr. Forest Pagan Macsen Nuttall, D.O. [facility]  PGY-3 Pager: [phone]  Phone: [phone] Date 07/24/2023  Time 9:13 AM

## 2023-07-24 DIAGNOSIS — J101 Influenza due to other identified influenza virus with other respiratory manifestations: Secondary | ICD-10-CM | POA: Insufficient documentation

## 2023-07-24 NOTE — Assessment & Plan Note (Signed)
 He started to feel fatigued with productive cough on Friday. Over the weekend he developed an elevated temperature to 100.37F. He thinks he was exposed when he was on air plane coming back from vacation last week or from his daughter.  He is tolerating fluids and taking robitussin with some improvement in symptoms.  P: Flu A +, he is outside window for tamiflu to be helpful. I called and reviewed results with him. He will continue to quarantine away from his wife the next few days.

## 2023-07-25 ENCOUNTER — Other Ambulatory Visit (HOSPITAL_COMMUNITY): Payer: Self-pay

## 2023-07-25 NOTE — Progress Notes (Signed)
 Internal Medicine Clinic Attending  Case discussed with the resident at the time of the visit.  We reviewed the resident's history and exam and pertinent patient test results.  I agree with the assessment, diagnosis, and plan of care documented in the resident's note.

## 2023-08-02 ENCOUNTER — Encounter: Payer: Medicare HMO | Admitting: Student

## 2023-08-08 ENCOUNTER — Telehealth: Payer: Self-pay | Admitting: *Deleted

## 2023-08-08 NOTE — Telephone Encounter (Signed)
Do we have any in person appointments in clinic this afternoon or tomorrow?

## 2023-08-08 NOTE — Telephone Encounter (Signed)
Given it has been two weeks since diagnosis without improvement, I would recommend re-evaluation in person.

## 2023-08-08 NOTE — Telephone Encounter (Signed)
At this time, no available appts this afternoon nor tomorrow.

## 2023-08-08 NOTE — Telephone Encounter (Signed)
Received a call from pt who stated he was dx with the flu; OV 07/23/23. Pt stated he's still having chest congestion, nasal congestion, sore throat. He has been taking OTC Mucinex, Tylenol, Ibu, Vit C , Dayquil, Nyquil- nothing is helping. Pt is concern about getting pneumonia. Pt wants know what to do now?

## 2023-08-08 NOTE — Telephone Encounter (Signed)
Pt stated he does not want to go to UC and does not need an appt; stated he thought the doctor could send a rx to the pharmacy.

## 2023-08-14 ENCOUNTER — Other Ambulatory Visit (HOSPITAL_COMMUNITY): Payer: Self-pay

## 2023-08-15 ENCOUNTER — Other Ambulatory Visit: Payer: Self-pay | Admitting: Student in an Organized Health Care Education/Training Program

## 2023-08-15 DIAGNOSIS — F1190 Opioid use, unspecified, uncomplicated: Secondary | ICD-10-CM

## 2023-08-15 DIAGNOSIS — N529 Male erectile dysfunction, unspecified: Secondary | ICD-10-CM

## 2023-08-16 ENCOUNTER — Other Ambulatory Visit: Payer: Self-pay

## 2023-08-16 ENCOUNTER — Other Ambulatory Visit (HOSPITAL_COMMUNITY): Payer: Self-pay

## 2023-08-16 MED ORDER — BUPRENORPHINE HCL-NALOXONE HCL 8-2 MG SL FILM
1.0000 | ORAL_FILM | Freq: Three times a day (TID) | SUBLINGUAL | 1 refills | Status: DC
  Filled 2023-08-16 – 2023-08-26 (×2): qty 90, 30d supply, fill #0
  Filled 2023-09-18: qty 90, 30d supply, fill #1

## 2023-08-16 MED ORDER — METOPROLOL SUCCINATE ER 50 MG PO TB24
50.0000 mg | ORAL_TABLET | Freq: Every day | ORAL | 3 refills | Status: AC
  Filled 2023-08-16 – 2023-09-11 (×2): qty 90, 90d supply, fill #0
  Filled 2023-12-30: qty 90, 90d supply, fill #1
  Filled 2024-03-17: qty 90, 90d supply, fill #2
  Filled 2024-07-28: qty 90, 90d supply, fill #3

## 2023-08-16 MED ORDER — TADALAFIL 10 MG PO TABS
10.0000 mg | ORAL_TABLET | ORAL | 1 refills | Status: AC | PRN
  Filled 2023-08-16 – 2023-10-28 (×2): qty 20, 20d supply, fill #0
  Filled 2023-12-30: qty 20, 20d supply, fill #1

## 2023-08-16 MED ORDER — GABAPENTIN 300 MG PO CAPS
900.0000 mg | ORAL_CAPSULE | Freq: Three times a day (TID) | ORAL | 3 refills | Status: DC
  Filled 2023-08-16: qty 270, 30d supply, fill #0
  Filled 2023-12-30: qty 270, 30d supply, fill #1
  Filled 2024-03-17: qty 270, 30d supply, fill #2
  Filled 2024-05-29: qty 270, 30d supply, fill #3

## 2023-08-26 ENCOUNTER — Other Ambulatory Visit (HOSPITAL_COMMUNITY): Payer: Self-pay

## 2023-08-28 ENCOUNTER — Ambulatory Visit: Payer: Medicare HMO

## 2023-08-28 VITALS — Wt 192.0 lb

## 2023-08-28 DIAGNOSIS — Z0000 Encounter for general adult medical examination without abnormal findings: Secondary | ICD-10-CM

## 2023-08-28 NOTE — Progress Notes (Signed)
Subjective:   Joseph Ayala is a 58 y.o. male who presents for Medicare Annual/Subsequent preventive examination.  Visit Complete: Virtual I connected with  Joseph Ayala on 08/28/23 by a audio enabled telemedicine application and verified that I am speaking with the correct person using two identifiers.  Patient Location: Home  Provider Location: Home Office  I discussed the limitations of evaluation and management by telemedicine. The patient expressed understanding and agreed to proceed.  Vital Signs: Because this visit was a virtual/telehealth visit, some criteria may be missing or patient reported. Any vitals not documented were not able to be obtained and vitals that have been documented are patient reported.  This patient declined Interactive audio and Acupuncturist. Therefore the visit was completed with audio only.  Cardiac Risk Factors include: advanced age (>28men, >11 women);sedentary lifestyle;male gender;hypertension     Objective:    Today's Vitals   08/28/23 1045  Weight: 192 lb (87.1 kg)  PainSc: 6   PainLoc: Generalized   Body mass index is 26.78 kg/m.     08/28/2023   10:48 AM 02/19/2023    9:04 AM 10/29/2022   11:21 AM 10/20/2022    9:38 AM 10/15/2022    9:26 AM 09/27/2022    2:52 AM 07/31/2022    5:00 AM  Advanced Directives  Does Patient Have a Medical Advance Directive? Yes No No No No No No  Type of Estate agent of [place];Living will        Copy of Healthcare Power of Attorney in Chart? No - copy requested        Would patient like information on creating a medical advance directive?  No - Patient declined No - Patient declined Yes (ED - Information included in AVS) No - Patient declined      Current Medications (verified) Outpatient Encounter Medications as of 08/28/2023  Medication Sig   amLODipine (NORVASC) 5 MG tablet Take 1 tablet (5 mg total) by mouth daily.   Buprenorphine HCl-Naloxone HCl 8-2 MG FILM  Place 1 Film under the tongue in the morning, at noon, and at bedtime.   chlorthalidone (HYGROTON) 25 MG tablet Take 1 tablet (25 mg total) by mouth daily.   diclofenac Sodium (VOLTAREN) 1 % GEL Apply 1 gram topically 4 (four) times a day.   gabapentin (NEURONTIN) 300 MG capsule Take 3 capsules (900 mg total) by mouth 3 (three) times daily for Neuropathy   metoprolol succinate (TOPROL-XL) 50 MG 24 hr tablet Take 1 tablet (50 mg total) by mouth daily. Take with or immediately following a meal.   testosterone cypionate (DEPOTESTOSTERONE CYPIONATE) 200 MG/ML injection Inject 1 mL (200 mg total) into the muscle every 7 (seven) days.   tadalafil (CIALIS) 10 MG tablet Take 1 tablet (10 mg total) by mouth as needed for erectile dysfunction. (Patient not taking: Reported on 08/28/2023)   No facility-administered encounter medications on file as of 08/28/2023.    Allergies (verified) Ivp dye [iodinated contrast media], Lyrica cr [pregabalin er], Vicodin [hydrocodone-acetaminophen], and Quinolones   History: Past Medical History:  Diagnosis Date   Acute HFmrEF (heart failure with mildly reduced ejection fraction) (HCC) 10/31/2020   Acute hypoxemic respiratory failure (HCC)    Acute respiratory failure with hypoxia (HCC) 08/28/2020   CAP (community acquired pneumonia) 08/28/2020   Degenerative disc disease, lumbar    Dental crown present    ETOH abuse    History of kidney stones    Hypertension    states under  control with meds., has been on med. x 1 yr. - is currently out of med., to see PCP 02/06/2018   Marijuana use, continuous    MRSA pneumonia (HCC) 09/09/2020   Muscle atrophy 01/2018   Muscle weakness 01/2018   Necrotizing myopathy    Necrotizing pneumonia (HCC) 09/09/2020   Neuropathic pain    Opioid use disorder    PAF (paroxysmal atrial fibrillation) (HCC)    Pleural effusion    Sepsis (HCC) 08/28/2020   Thoracic aortic aneurysm (TAA) (HCC)    a. seen on CT 08/2020.   Past Surgical History:   Procedure Laterality Date   ENDOVENOUS ABLATION SAPHENOUS VEIN W/ LASER Right 12-29-2015   endovenous laser ablation right greater saphenous vein, stab phlebectomy > 20 incisions right leg, sclerotherapy right leg by Gretta Began MD     MINOR MUSCLE BIOPSY Right 02/11/2018   Procedure: RECTUS FEMORIS MUSCLE BIOPSY;  Surgeon: Darnell Level, MD;  Location: [facility];  Service: General;  Laterality: Right;   MUSCLE BIOPSY Right 02/11/2018   Procedure: DELTOID MUSCLE BIOPSY;  Surgeon: Darnell Level, MD;  Location: [facility];  Service: General;  Laterality: Right;   NASAL FRACTURE SURGERY     TRACHEOSTOMY TUBE PLACEMENT N/A 09/14/2020   Procedure: TRACHEOSTOMY;  Surgeon: Suzanna Obey, MD;  Location: [facility];  Service: ENT;  Laterality: N/A;   Family History  Problem Relation Age of Onset   Varicose Veins Brother    Drug abuse Brother    Social History   Socioeconomic History   Marital status: Married    Spouse name: Not on file   Number of children: 1   Years of education: 13   Highest education level: Some college, no degree  Occupational History   Occupation: not working  Tobacco Use   Smoking status: Never   Smokeless tobacco: Former  Building services engineer status: Never Used  Substance and Sexual Activity   Alcohol use: Not Currently    Comment: occasionally   Drug use: No   Sexual activity: Yes    Partners: Female    Birth control/protection: None  Other Topics Concern   Not on file  Social History Narrative   Lives with wife and daughter in a one story home.  Has one child.  Currently not working.  Education: some college.    Social Drivers of Corporate investment banker Strain: Low Risk  (08/28/2023)   Overall Financial Resource Strain (CARDIA)    Difficulty of Paying Living Expenses: Not hard at all  Food Insecurity: No Food Insecurity (08/28/2023)   Hunger Vital Sign    Worried About Running Out of Food in the Last Year: Never true    Ran Out of  Food in the Last Year: Never true  Transportation Needs: No Transportation Needs (08/28/2023)   PRAPARE - Administrator, Civil Service (Medical): No    Lack of Transportation (Non-Medical): No  Physical Activity: Inactive (08/28/2023)   Exercise Vital Sign    Days of Exercise per Week: 0 days    Minutes of Exercise per Session: 0 min  Stress: No Stress Concern Present (08/28/2023)   [employer] of Occupational Health - Occupational Stress Questionnaire    Feeling of Stress : Not at all  Social Connections: Moderately Isolated (08/28/2023)   Social Connection and Isolation Panel [NHANES]    Frequency of Communication with Friends and Family: Twice a week    Frequency of Social Gatherings with  Friends and Family: Twice a week    Attends Religious Services: Never    Database administrator or Organizations: No    Attends Engineer, structural: Never    Marital Status: Married    Tobacco Counseling Counseling given: Not Answered   Clinical Intake:  Pre-visit preparation completed: Yes  Pain : 0-10 Pain Score: 6  Pain Type: Chronic pain     BMI - recorded: 26.78 Nutritional Risks: None Diabetes: No  How often do you need to have someone help you when you read instructions, pamphlets, or other written materials from your doctor or pharmacy?: 1 - Never What is the last grade level you completed in school?: SOME COLLEGE  Interpreter Needed?: No  Information entered by :: Susie Cassette, LPN.   Activities of Daily Living    08/28/2023   10:50 AM 04/08/2023   10:26 AM  In your present state of health, do you have any difficulty performing the following activities:  Hearing? 0 1  Vision? 0 0  Difficulty concentrating or making decisions? 0 1  Walking or climbing stairs? 0 0  Dressing or bathing? 0 0  Doing errands, shopping? 0 0  Preparing Food and eating ? N   Using the Toilet? N   In the past six months, have you accidently leaked urine? N    Do you have problems with loss of bowel control? N   Managing your Medications? N   Managing your Finances? N   Housekeeping or managing your Housekeeping? N     Patient Care Team: [name], MD as PCP - General (Internal Medicine) [name], MD as PCP - Cardiology (Cardiology) Glendale Chard, DO as Consulting Physician (Neurology)  Indicate any recent Medical Services you may have received from other than [facility] providers in the past year (date may be approximate).     Assessment:   This is a routine wellness examination for Rishab.  Hearing/Vision screen Hearing Screening - Comments:: Denies hearing difficulties.   Vision Screening - Comments:: No rx glasses - not up to date with routine eye exams.    Goals Addressed             This Visit's Progress    My healthcare goal for 2025 is to wash my hands and stay germ free.        Depression Screen    08/28/2023   10:50 AM 02/19/2023    9:07 AM 11/06/2022    9:41 AM 10/29/2022   12:03 PM 10/18/2022   11:25 AM 10/15/2022    9:27 AM 09/25/2022    6:50 PM  PHQ 2/9 Scores  PHQ - 2 Score 0 0  0  0   PHQ- 9 Score 0 11          Information is confidential and restricted. Go to Review Flowsheets to unlock data.    Fall Risk    08/28/2023   10:49 AM 04/08/2023   10:25 AM 02/19/2023    9:06 AM 10/29/2022   12:03 PM 10/15/2022    8:44 AM  Fall Risk   Falls in the past year? 0 1 1 0 0  Number falls in past yr: 0 1 1 0 0  Injury with Fall? 0 1 0 0 0  Risk for fall due to : No Fall Risks Impaired balance/gait Impaired balance/gait    Follow up Falls prevention discussed;Falls evaluation completed Falls evaluation completed Falls evaluation completed Falls evaluation completed Falls evaluation completed  MEDICARE RISK AT HOME: Medicare Risk at Home Any stairs in or around the home?: No If so, are there any without handrails?: No Home free of loose throw rugs in walkways, pet beds, electrical cords,  etc?: Yes Adequate lighting in your home to reduce risk of falls?: Yes Life alert?: No Use of a cane, walker or w/c?: No Grab bars in the bathroom?: Yes Shower chair or bench in shower?: Yes Elevated toilet seat or a handicapped toilet?: Yes  TIMED UP AND GO:  Was the test performed?  No    Cognitive Function:    08/28/2023   10:50 AM  MMSE - Mini Mental State Exam  Not completed: Unable to complete        08/28/2023   10:50 AM 03/19/2022    1:24 PM  6CIT Screen  What Year? 0 points 0 points  What month? 0 points 0 points  What time? 0 points 0 points  Count back from 20 0 points 0 points  Months in reverse 0 points 0 points  Repeat phrase 0 points 0 points  Total Score 0 points 0 points    Immunizations Immunization History  Administered Date(s) Administered   Influenza,inj,Quad PF,6+ Mos 04/23/2022   Influenza-Unspecified 05/15/2021   PFIZER(Purple Top)SARS-COV-2 Vaccination 10/08/2019, 11/02/2019   PNEUMOCOCCAL CONJUGATE-20 05/15/2021   Zoster Recombinant(Shingrix) 02/06/2022    TDAP status: Due, Education has been provided regarding the importance of this vaccine. Advised may receive this vaccine at local pharmacy or Health Dept. Aware to provide a copy of the vaccination record if obtained from local pharmacy or Health Dept. Verbalized acceptance and understanding.  Flu Vaccine status: Due, Education has been provided regarding the importance of this vaccine. Advised may receive this vaccine at local pharmacy or Health Dept. Aware to provide a copy of the vaccination record if obtained from local pharmacy or Health Dept. Verbalized acceptance and understanding.  Pneumococcal vaccine status: Up to date  Covid-19 vaccine status: Completed vaccines  Qualifies for Shingles Vaccine? Yes   Zostavax completed No   Shingrix Completed?: No.    Education has been provided regarding the importance of this vaccine. Patient has been advised to call insurance company to  determine out of pocket expense if they have not yet received this vaccine. Advised may also receive vaccine at local pharmacy or Health Dept. Verbalized acceptance and understanding.  Screening Tests Health Maintenance  Topic Date Due   DTaP/Tdap/Td (1 - Tdap) Never done   Fecal DNA (Cologuard)  Never done   Zoster Vaccines- Shingrix (2 of 2) 04/03/2022   COLON CANCER SCREENING ANNUAL FOBT  05/18/2022   INFLUENZA VACCINE  02/07/2023   COVID-19 Vaccine (3 - 2024-25 season) 03/10/2023   Medicare Annual Wellness (AWV)  08/27/2024   Pneumococcal Vaccine 42-27 Years old  Completed   Hepatitis C Screening  Completed   HIV Screening  Completed   HPV VACCINES  Aged Out    Health Maintenance  Health Maintenance Due  Topic Date Due   DTaP/Tdap/Td (1 - Tdap) Never done   Fecal DNA (Cologuard)  Never done   Zoster Vaccines- Shingrix (2 of 2) 04/03/2022   COLON CANCER SCREENING ANNUAL FOBT  05/18/2022   INFLUENZA VACCINE  02/07/2023   COVID-19 Vaccine (3 - 2024-25 season) 03/10/2023    Colorectal cancer screening: Type of screening: FOBT/FIT. Completed 05/18/2021. Repeat every 1 years  Lung Cancer Screening: (Low Dose CT Chest recommended if Age 66-80 years, 20 pack-year currently smoking OR have quit w/in 15years.)  does not qualify.   Lung Cancer Screening Referral: no  Additional Screening:  Hepatitis C Screening: does qualify; Completed 09/09/2018  Vision Screening: Recommended annual ophthalmology exams for early detection of glaucoma and other disorders of the eye. Is the patient up to date with their annual eye exam?  No  Who is the provider or what is the name of the office in which the patient attends annual eye exams? No If pt is not established with a provider, would they like to be referred to a provider to establish care? No .   Dental Screening: Recommended annual dental exams for proper oral hygiene   Community Resource Referral / Chronic Care Management: CRR required  this visit?  No   CCM required this visit?  No     Plan:     I have personally reviewed and noted the following in the patient's chart:   Medical and social history Use of alcohol, tobacco or illicit drugs  Current medications and supplements including opioid prescriptions. Patient is not currently taking opioid prescriptions. Functional ability and status Nutritional status Physical activity Advanced directives List of other physicians Hospitalizations, surgeries, and ER visits in previous 12 months Vitals Screenings to include cognitive, depression, and falls Referrals and appointments  In addition, I have reviewed and discussed with patient certain preventive protocols, quality metrics, and best practice recommendations. A written personalized care plan for preventive services as well as general preventive health recommendations were provided to patient.     Mickeal Needy, LPN   1/61/0960   After Visit Summary: (MyChart) Due to this being a telephonic visit, the after visit summary with patients personalized plan was offered to patient via MyChart   Nurse Notes: See routine comments.

## 2023-08-28 NOTE — Patient Instructions (Addendum)
Mr. Gorton , Thank you for taking time to come for your Medicare Wellness Visit. I appreciate your ongoing commitment to your health goals. Please review the following plan we discussed and let me know if I can assist you in the future.   Referrals/Orders/Follow-Ups/Clinician Recommendations: Yes; Keep maintaining your health by keeping your appointments with Dr. Oswaldo Done and any specialists that you may see.  Call us if you need anything.  Have a great year!!!!  This is a list of the screening recommended for you and due dates:  Health Maintenance  Topic Date Due   DTaP/Tdap/Td vaccine (1 - Tdap) Never done   Cologuard (Stool DNA test)  Never done   Zoster (Shingles) Vaccine (2 of 2) 04/03/2022   Stool Blood Test  05/18/2022   Flu Shot  02/07/2023   COVID-19 Vaccine (3 - 2024-25 season) 03/10/2023   Medicare Annual Wellness Visit  08/27/2024   Pneumococcal Vaccination  Completed   Hepatitis C Screening  Completed   HIV Screening  Completed   HPV Vaccine  Aged Out    Advanced directives: (Copy Requested) Please bring a copy of your health care power of attorney and living will to the office to be added to your chart at your convenience.  Next Medicare Annual Wellness Visit scheduled for next year: Yes

## 2023-09-11 ENCOUNTER — Other Ambulatory Visit (HOSPITAL_COMMUNITY): Payer: Self-pay

## 2023-09-18 ENCOUNTER — Other Ambulatory Visit: Payer: Self-pay

## 2023-09-18 ENCOUNTER — Encounter: Payer: Self-pay | Admitting: Student in an Organized Health Care Education/Training Program

## 2023-09-18 NOTE — Telephone Encounter (Signed)
 Reached out to patient, scheduled with Dr. Oswaldo Done on 3/18

## 2023-09-24 ENCOUNTER — Ambulatory Visit: Admitting: Student in an Organized Health Care Education/Training Program

## 2023-09-25 ENCOUNTER — Encounter: Admitting: Student in an Organized Health Care Education/Training Program

## 2023-09-25 ENCOUNTER — Other Ambulatory Visit (HOSPITAL_COMMUNITY): Payer: Self-pay

## 2023-09-25 ENCOUNTER — Ambulatory Visit (INDEPENDENT_AMBULATORY_CARE_PROVIDER_SITE_OTHER): Admitting: Student in an Organized Health Care Education/Training Program

## 2023-09-25 ENCOUNTER — Encounter: Payer: Self-pay | Admitting: Student in an Organized Health Care Education/Training Program

## 2023-09-25 VITALS — BP 122/80 | HR 87 | Wt 190.0 lb

## 2023-09-25 DIAGNOSIS — R7989 Other specified abnormal findings of blood chemistry: Secondary | ICD-10-CM

## 2023-09-25 DIAGNOSIS — F909 Attention-deficit hyperactivity disorder, unspecified type: Secondary | ICD-10-CM

## 2023-09-25 DIAGNOSIS — F1190 Opioid use, unspecified, uncomplicated: Secondary | ICD-10-CM | POA: Diagnosis not present

## 2023-09-25 DIAGNOSIS — I10 Essential (primary) hypertension: Secondary | ICD-10-CM

## 2023-09-25 LAB — POCT URINE DRUG SCREEN
Buprenorphine: POSITIVE
POC Amphetamine UR: POSITIVE — AB
POC BENZODIAZEPINES UR: NOT DETECTED
POC Barbiturate UR: NOT DETECTED — AB
POC Cocaine UR: NOT DETECTED
POC Ecstasy UR: POSITIVE — AB
POC Marijuana UR: POSITIVE — AB
POC Methadone UR: POSITIVE — AB
POC Oxycodone UR: NOT DETECTED
POC PHENCYCLIDINE UR: NOT DETECTED

## 2023-09-25 MED ORDER — BUPRENORPHINE HCL-NALOXONE HCL 8-2 MG SL SUBL
1.0000 | SUBLINGUAL_TABLET | Freq: Three times a day (TID) | SUBLINGUAL | 0 refills | Status: DC
  Filled 2023-09-25: qty 90, 30d supply, fill #0

## 2023-09-25 MED ORDER — TESTOSTERONE CYPIONATE 200 MG/ML IM SOLN
400.0000 mg | INTRAMUSCULAR | 1 refills | Status: DC
  Filled 2023-09-25 – 2023-11-19 (×3): qty 10, 70d supply, fill #0
  Filled 2024-02-04 (×2): qty 10, 70d supply, fill #1

## 2023-09-25 NOTE — Assessment & Plan Note (Signed)
 Chronic and stable.  Cravings are well-controlled and he reports no relapses with opioids in 1 year.  Good adherence with Suboxone.  Has a side effect of multiple dental caries from the Suboxone films.  His dentist recommended that he switch to the tablet which should have a lower risk for further erosion.  I think this is reasonable.  I warned him about the risks of a formulary switch, a potential cause some withdrawal symptoms, but should be mild and manageable.  We did a urine toxicology today which was positive for buprenorphine.  It did also positive for methadone, but I suspect this is a false positive and will follow-up with that later.

## 2023-09-25 NOTE — Progress Notes (Signed)
 Established Patient Office Visit  Subjective   Patient ID: Joseph Ayala, male    DOB: Nov 10, 1965  Age: 58 y.o. MRN: [id number]  Chief Complaint  Patient presents with   Transitions Of Care    Would like to discuss change of medications.     HPI  58 year old person here for management of opioid use disorder.  Doing really well since I last saw him.  Cravings are well-controlled.  No use of opioids in over 56 year.  Things are going well for him at home.  Mood is stable, no depression or anxiety.  No hallucinations.  Reports good adherence with his other medications and no other side effects.  He is having some issues with dental caries and is interested in switching from the Suboxone film to the tablets.    Objective:     BP 122/80   Pulse 87   Wt 190 lb (86.2 kg)   SpO2 97%   BMI 26.50 kg/m    Physical Exam  Gen: Well-appearing Neck: Normal thyroid, no adenopathy, well-healed tracheostomy scar Heart: Regular no murmur Lungs: Unlabored, clear throughout Ext: Warm and well-perfused with no lower extremity edema, normal muscle bulk Neuro: Alert, conversational, full strength upper and lower extremities and normal gait Skin: No rashes Psych: Appropriate mood and affect, not depressed or anxious appearing, a little trouble focusing     Assessment & Plan:   Problem List Items Addressed This Visit       High   Opioid use disorder - Primary (Chronic)   Chronic and stable.  Cravings are well-controlled and he reports no relapses with opioids in 1 year.  Good adherence with Suboxone.  Has a side effect of multiple dental caries from the Suboxone films.  His dentist recommended that he switch to the tablet which should have a lower risk for further erosion.  I think this is reasonable.  I warned him about the risks of a formulary switch, a potential cause some withdrawal symptoms, but should be mild and manageable.  We did a urine toxicology today which was positive for  buprenorphine.  It did also positive for methadone, but I suspect this is a false positive and will follow-up with that later.      Relevant Orders   POCT Urine Drug Screen (Completed)     Medium    Hypertension (Chronic)   Blood pressure well-controlled today.  Patient discontinued amlodipine on his own which seems reasonable.  Plan to continue with chlorthalidone 25 mg daily.  He also uses metoprolol succinate because of a small thoracic aortic aneurysm.        Low   Low testosterone (Chronic)   Chronic and stable.  Symptomatically well-controlled.  There was a little confusion about dosing routine which we discussed.  Plan to continue intramuscular testosterone injection 400 mg every 2 weeks.  At our next visit I asked him to come in about 1 week after his last injection and we will check a testosterone level along with CBC      Relevant Medications   testosterone cypionate (DEPOTESTOSTERONE CYPIONATE) 200 MG/ML injection   ADHD (Chronic)   Chronic and not well-controlled.  We have tried nonstimulant medications in the past with atomoxetine and bupropion, but he was unable to tolerate this.  I told him I do not recommend the use of stimulants like Ritalin coprescribed with buprenorphine.       Return in about 4 weeks (around 10/23/2023).    [name],  MD

## 2023-09-25 NOTE — Assessment & Plan Note (Signed)
 Chronic and stable.  Symptomatically well-controlled.  There was a little confusion about dosing routine which we discussed.  Plan to continue intramuscular testosterone injection 400 mg every 2 weeks.  At our next visit I asked him to come in about 1 week after his last injection and we will check a testosterone level along with CBC

## 2023-09-25 NOTE — Assessment & Plan Note (Signed)
 Chronic and not well-controlled.  We have tried nonstimulant medications in the past with atomoxetine and bupropion, but he was unable to tolerate this.  I told him I do not recommend the use of stimulants like Ritalin coprescribed with buprenorphine.

## 2023-09-25 NOTE — Assessment & Plan Note (Signed)
 Blood pressure well-controlled today.  Patient discontinued amlodipine on his own which seems reasonable.  Plan to continue with chlorthalidone 25 mg daily.  He also uses metoprolol succinate because of a small thoracic aortic aneurysm.

## 2023-09-26 ENCOUNTER — Encounter: Admitting: Student in an Organized Health Care Education/Training Program

## 2023-10-03 ENCOUNTER — Ambulatory Visit: Payer: Self-pay | Admitting: General Surgery

## 2023-10-03 DIAGNOSIS — K4020 Bilateral inguinal hernia, without obstruction or gangrene, not specified as recurrent: Secondary | ICD-10-CM | POA: Diagnosis not present

## 2023-10-21 ENCOUNTER — Other Ambulatory Visit: Payer: Self-pay | Admitting: Surgery

## 2023-10-21 DIAGNOSIS — I712 Thoracic aortic aneurysm, without rupture, unspecified: Secondary | ICD-10-CM

## 2023-10-23 ENCOUNTER — Ambulatory Visit: Admitting: Student in an Organized Health Care Education/Training Program

## 2023-10-28 ENCOUNTER — Other Ambulatory Visit: Payer: Self-pay

## 2023-10-28 ENCOUNTER — Other Ambulatory Visit: Payer: Self-pay | Admitting: Student in an Organized Health Care Education/Training Program

## 2023-10-28 ENCOUNTER — Telehealth: Payer: Self-pay

## 2023-10-28 ENCOUNTER — Other Ambulatory Visit: Payer: Self-pay | Admitting: Student

## 2023-10-28 ENCOUNTER — Other Ambulatory Visit (HOSPITAL_COMMUNITY): Payer: Self-pay

## 2023-10-28 MED ORDER — BUPRENORPHINE HCL-NALOXONE HCL 8-2 MG SL SUBL
1.0000 | SUBLINGUAL_TABLET | Freq: Three times a day (TID) | SUBLINGUAL | 0 refills | Status: DC
  Filled 2023-10-28: qty 90, 30d supply, fill #0

## 2023-10-28 NOTE — Telephone Encounter (Signed)
 I spoke with the patient by phone.  He has having an acute issue of low back pain.  Can you please put him on my schedule for tomorrow 4/22 at 940 for an acute visit?  I confirmed this time with him today on the phone.  Thank you.

## 2023-10-28 NOTE — Telephone Encounter (Signed)
 Copied from CRM [id number]. Topic: Clinical - Medication Question >> Oct 28, 2023  1:39 PM Joseph Ayala P wrote: Reason for CRM: Pt would like to get predniSONE  (DELTASONE ) 10 MG tablet  refill however he wants to know how to go about getting the medication as PCP has switched practice- Pt would like callback

## 2023-10-28 NOTE — Telephone Encounter (Signed)
 Patient scheduled.

## 2023-10-28 NOTE — Progress Notes (Signed)
 Spoke with patient on the phone who reported that he will continue care with the [facility]. PDMP reviewed, will refill suboxone . He has an appointment at the [facility] on 4/23.

## 2023-10-29 ENCOUNTER — Ambulatory Visit: Admitting: Student in an Organized Health Care Education/Training Program

## 2023-10-29 ENCOUNTER — Other Ambulatory Visit: Payer: Self-pay

## 2023-10-29 ENCOUNTER — Other Ambulatory Visit (HOSPITAL_COMMUNITY): Payer: Self-pay

## 2023-10-30 ENCOUNTER — Ambulatory Visit (INDEPENDENT_AMBULATORY_CARE_PROVIDER_SITE_OTHER): Admitting: Student

## 2023-10-30 VITALS — BP 120/88 | HR 85 | Temp 97.6°F | Ht 71.0 in | Wt 193.0 lb

## 2023-10-30 DIAGNOSIS — M545 Low back pain, unspecified: Secondary | ICD-10-CM | POA: Diagnosis not present

## 2023-10-30 DIAGNOSIS — E7404 McArdle disease: Secondary | ICD-10-CM | POA: Diagnosis not present

## 2023-10-30 DIAGNOSIS — R7989 Other specified abnormal findings of blood chemistry: Secondary | ICD-10-CM | POA: Diagnosis not present

## 2023-10-30 DIAGNOSIS — F1190 Opioid use, unspecified, uncomplicated: Secondary | ICD-10-CM | POA: Diagnosis not present

## 2023-10-30 DIAGNOSIS — Z87442 Personal history of urinary calculi: Secondary | ICD-10-CM

## 2023-10-30 NOTE — Progress Notes (Addendum)
 Established Patient Office Visit  Subjective   Patient ID: Joseph Ayala, male    DOB: 1965/09/16  Age: 58 y.o. MRN: [id number]  Chief Complaint  Patient presents with   Follow-up    Medication refill / back pain # 10    Joseph Ayala is a 58 y.o. who presents to the clinic for OUD follow up and an acute "back pain". Please see problem based assessment and plan for additional details.   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) 04/26/2022   Substance induced mood disorder (HCC) 01/01/2022   Hyperlipidemia 08/21/2021   Healthcare maintenance 05/15/2021   Erectile dysfunction 10/31/2020   Thoracic aortic aneurysm without rupture (HCC)    Hypertension 07/13/2019   ADHD 06/29/2019   Low testosterone  06/01/2019   Opioid use disorder 09/11/2018   Necrotizing myopathy 12/27/2017   Chronic pain syndrome 10/22/2017   Degeneration of lumbar intervertebral disc 09/25/2017   Lumbar spondylosis 09/25/2017      Objective:     BP 120/88 (BP Location: Left Arm, Patient Position: Sitting, Cuff Size: Normal)   Pulse 85   Temp 97.6 F (36.4 C) (Oral)   Ht 5\' 11"  (1.803 m)   Wt 193 lb (87.5 kg)   SpO2 100%   BMI 26.92 kg/m  BP Readings from Last 3 Encounters:  10/30/23 120/88  09/25/23 122/80  07/23/23 136/80   Wt Readings from Last 3 Encounters:  10/30/23 193 lb (87.5 kg)  09/25/23 190 lb (86.2 kg)  08/28/23 192 lb (87.1 kg)      Physical Exam Vitals reviewed.  Constitutional:      General: He is not in acute distress.    Appearance: He is normal weight. He is not ill-appearing, toxic-appearing or diaphoretic.  Cardiovascular:     Rate and Rhythm: Normal rate and regular rhythm.      Heart sounds: Normal heart sounds. No murmur heard. Pulmonary:     Effort: Pulmonary effort is normal. No respiratory distress.     Breath sounds: Normal breath sounds. No wheezing or rales.  Musculoskeletal:     Comments: L lumbar tenderness present along the paraspinal muscles   Skin:    General: Skin is warm and dry.  Neurological:     Mental Status: He is alert.  Psychiatric:        Attention and Perception: Attention normal.        Mood and Affect: Mood and affect normal.        Speech: Speech normal.        Behavior: Behavior normal. Behavior is cooperative.      Results for orders placed or performed in visit on 10/30/23  Microscopic Examination   Urine  Result Value Ref Range   WBC, UA None seen 0 - 5 /hpf   RBC, Urine 0-2 0 - 2 /hpf   Epithelial Cells (non renal) None seen 0 - 10 /hpf   Casts None seen None seen /lpf   Bacteria, UA None seen None seen/Few  Urinalysis, Reflex Microscopic  Result Value Ref Range   Specific Gravity, UA      >=1.030 (A) 1.005 - 1.030   pH, UA 6.5 5.0 - 7.5  Color, UA Yellow Yellow   Appearance Ur Clear Clear   Leukocytes,UA Trace (A) Negative   Protein,UA Trace Negative/Trace   Glucose, UA 1+ (A) Negative   Ketones, UA Trace (A) Negative   RBC, UA Negative Negative   Bilirubin, UA CANCELED    Urobilinogen, Ur 1.0 0.2 - 1.0 mg/dL   Nitrite, UA Negative Negative   Microscopic Examination See below:   Testosterone , Total  Result Value Ref Range   Testosterone  >1500 (H) 264 - 916 ng/dL    Last metabolic panel Lab Results  Component Value Date   GLUCOSE 87 04/08/2023   NA 141 04/08/2023   K 3.6 04/08/2023   CL 100 04/08/2023   CO2 26 04/08/2023   BUN 18 04/08/2023   CREATININE 0.64 (L) 04/08/2023   EGFR 112 04/08/2023   CALCIUM  9.9 04/08/2023   PHOS 3.9 09/16/2020   PROT 6.8 04/08/2023   ALBUMIN  4.8 04/08/2023   LABGLOB 2.0 04/08/2023   AGRATIO 2.5 (H) 07/23/2022   BILITOT 0.3 04/08/2023   ALKPHOS 94 04/08/2023    AST 61 (H) 04/08/2023   ALT 61 (H) 04/08/2023   ANIONGAP 7 10/20/2022   Last lipids Lab Results  Component Value Date   CHOL 212 (H) 01/28/2023   HDL 41 01/28/2023   LDLCALC 131 (H) 01/28/2023   TRIG 224 (H) 01/28/2023   CHOLHDL 5.2 (H) 01/28/2023   Last hemoglobin A1c Lab Results  Component Value Date   HGBA1C 5.6 09/25/2022      The 10-year ASCVD risk score (Arnett DK, et al., 2019) is: 8%    Assessment & Plan:   Problem List Items Addressed This Visit       Musculoskeletal and Integument   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 Patient reports episodic constipation. They deny relapses or cravings. He reports use of Mariajuana daily and methamphetamine occasionally.  Plan: -Contract UTD -Toxassure ordered, prior toxassure was in December 2024 and had the presence of  Methamphetamine and THC  -Urine drug screen in March revealed: Ectasy, amphetamine, marijuana, and methadone       Relevant Orders   ToxAssure Select,+Antidepr,UR   Low testosterone  (Chronic)   Patient is on testosterone  therapy and has been since 2009. He reports that his last dose was on Monday and that he sometimes forgets to  inject the testosterone  every two weeks. He is on a current regimen of 2mL (400mg ) of testosterone  cypionate  every 14 days.  Plan: -Testosterone  level was> 1500, will need levels check midway between injections (next Monday)  -PSA needs to be checked annually: Will order at next visit  -CBC was last checked in September which was WNL, repeat in September 2025 -BP is well controlled today       Relevant Orders   Testosterone , Total (Completed)   Left low back pain   Patient reports left lumbar back pain that radiates to the flank that started on Sunday. He  stated that he had similar back pain with prior renal stones. He denied dysuria, hematuria, fevers, chills. The pain is fortunately improving, he has yet to "pass the stone". Plan: -Will collect a U/A -Patient declines additional therapy for pain control       Other Visit Diagnoses  History of renal stone       Relevant Orders   Urinalysis, Reflex Microscopic (Completed)       Return in about 1 week (around 11/06/2023) for Lab work and appointment with PCP .    Aurora Lees, DO

## 2023-10-31 ENCOUNTER — Other Ambulatory Visit (HOSPITAL_BASED_OUTPATIENT_CLINIC_OR_DEPARTMENT_OTHER): Payer: Self-pay

## 2023-10-31 ENCOUNTER — Other Ambulatory Visit (HOSPITAL_COMMUNITY): Payer: Self-pay

## 2023-10-31 DIAGNOSIS — M545 Low back pain, unspecified: Secondary | ICD-10-CM | POA: Insufficient documentation

## 2023-10-31 LAB — URINALYSIS, ROUTINE W REFLEX MICROSCOPIC
Nitrite, UA: NEGATIVE
RBC, UA: NEGATIVE
Specific Gravity, UA: >=1.03 — AB (ref 1.005–1.030)
Urobilinogen, Ur: 1 mg/dL (ref 0.2–1.0)
pH, UA: 6.5 (ref 5.0–7.5)

## 2023-10-31 LAB — MICROSCOPIC EXAMINATION
Bacteria, UA: NONE SEEN
Casts: NONE SEEN /LPF
Epithelial Cells (non renal): NONE SEEN /HPF (ref 0–10)
WBC, UA: NONE SEEN /HPF (ref 0–5)

## 2023-10-31 LAB — TESTOSTERONE: Testosterone: >1500 ng/dL — ABNORMAL HIGH (ref 264–916)

## 2023-10-31 NOTE — Assessment & Plan Note (Signed)
 Patient is on testosterone  therapy and has been since 2009. He reports that his last dose was on Monday and that he sometimes forgets to  inject the testosterone  every two weeks. He is on a current regimen of 2mL (400mg ) of testosterone  cypionate  every 14 days.  Plan: -Testosterone  level was> 1500, will need levels check midway between injections (next Monday)  -PSA needs to be checked annually: Will order at next visit  -CBC was last checked in September which was WNL, repeat in September 2025 -BP is well controlled today

## 2023-10-31 NOTE — Assessment & Plan Note (Signed)
 Patient presents with a history of opoid use disorder on suboxone  8-2 mg TID. Patient reports episodic constipation. They deny relapses or cravings. He reports use of Mariajuana daily and methamphetamine occasionally.  Plan: -Contract UTD -Toxassure ordered, prior toxassure was in December 2024 and had the presence of  Methamphetamine and THC  -Urine drug screen in March revealed: Ectasy, amphetamine, marijuana, and methadone 

## 2023-10-31 NOTE — Assessment & Plan Note (Signed)
 Patient reports left lumbar back pain that radiates to the flank that started on Sunday. He stated that he had similar back pain with prior renal stones. He denied dysuria, hematuria, fevers, chills. The pain is fortunately improving, he has yet to "pass the stone". Plan: -Will collect a U/A -Patient declines additional therapy for pain control

## 2023-11-01 ENCOUNTER — Other Ambulatory Visit (HOSPITAL_COMMUNITY): Payer: Self-pay

## 2023-11-02 LAB — TOXASSURE SELECT,+ANTIDEPR,UR

## 2023-11-04 ENCOUNTER — Encounter: Admitting: Student

## 2023-11-04 NOTE — Progress Notes (Deleted)
   Established Patient Office Visit  Subjective   Patient ID: Joseph Ayala, male    DOB: 01/24/1966  Age: 58 y.o. MRN: [id number]  No chief complaint on file.   Joseph Ayala is a 58 y.o. who presents to the clinic for ***. Please see problem based assessment and plan for additional details.  Patient Active Problem List   Diagnosis Date Noted  . Left low back pain 10/31/2023  . Acquired pes planus of left foot 04/08/2023  . Left inguinal hernia 02/19/2023  . Right inguinal hernia 02/19/2023  . Coronary artery disease 05/21/2022  . Pulmonary nodule 05/21/2022  . 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 Substance induced mood disorder (HCC) 01/01/2022  . Hyperlipidemia 08/21/2021  . Healthcare maintenance 05/15/2021  . Erectile dysfunction 10/31/2020  . Thoracic aortic aneurysm without rupture (HCC)   . Hypertension 07/13/2019  . ADHD 06/29/2019  . Low testosterone  06/01/2019  . Opioid use disorder 09/11/2018  . Necrotizing myopathy 12/27/2017  . Chronic pain syndrome 10/22/2017  . Degeneration of lumbar intervertebral disc 09/25/2017  . Lumbar spondylosis 09/25/2017       Objective:     There were no vitals taken for this visit. BP Readings from Last 3 Encounters:  10/30/23 120/88  09/25/23 122/80  07/23/23 136/80   Wt Readings from Last 3 Encounters:  10/30/23 193 lb (87.5 kg)  09/25/23 190 lb (86.2 kg)  08/28/23 192 lb (87.1 kg)      Physical Exam   No results found for any visits on 11/04/23.  Last metabolic panel Lab Results  Component Value Date   GLUCOSE 87 04/08/2023   NA 141 04/08/2023   K 3.6 04/08/2023   CL 100 04/08/2023   CO2 26 04/08/2023   BUN 18 04/08/2023   CREATININE 0.64 (L) 04/08/2023   EGFR 112 04/08/2023   CALCIUM  9.9 04/08/2023   PHOS 3.9 09/16/2020   PROT 6.8 04/08/2023   ALBUMIN  4.8 04/08/2023   LABGLOB 2.0 04/08/2023   AGRATIO 2.5 (H) 07/23/2022   BILITOT 0.3 04/08/2023   ALKPHOS 94  04/08/2023   AST 61 (H) 04/08/2023   ALT 61 (H) 04/08/2023   ANIONGAP 7 10/20/2022   Last lipids Lab Results  Component Value Date   CHOL 212 (H) 01/28/2023   HDL 41 01/28/2023   LDLCALC 131 (H) 01/28/2023   TRIG 224 (H) 01/28/2023   CHOLHDL 5.2 (H) 01/28/2023   Last hemoglobin A1c Lab Results  Component Value Date   HGBA1C 5.6 09/25/2022      The 10-year ASCVD risk score (Arnett DK, et al., 2019) is: 8%    Assessment & Plan:   Problem List Items Addressed This Visit   None   No follow-ups on file.    Aurora Lees, DO

## 2023-11-06 ENCOUNTER — Other Ambulatory Visit (HOSPITAL_BASED_OUTPATIENT_CLINIC_OR_DEPARTMENT_OTHER): Payer: Self-pay

## 2023-11-07 ENCOUNTER — Other Ambulatory Visit: Payer: Self-pay | Admitting: Student

## 2023-11-07 DIAGNOSIS — R7989 Other specified abnormal findings of blood chemistry: Secondary | ICD-10-CM

## 2023-11-07 DIAGNOSIS — E7404 McArdle disease: Secondary | ICD-10-CM

## 2023-11-07 DIAGNOSIS — Z79899 Other long term (current) drug therapy: Secondary | ICD-10-CM

## 2023-11-07 NOTE — Progress Notes (Signed)
 Spoke with patient on the phone. His last injection of testosterone  was 04/019. He will inject the biweekly dose on 11/08/2023 and come in for a testosterone  total lab and PSA on Friday 05/09. Orders entered.

## 2023-11-08 NOTE — Progress Notes (Signed)
 Internal Medicine Clinic Attending  Case discussed with the resident at the time of the visit.  We reviewed the resident's history and exam and pertinent patient test results.  I agree with the assessment, diagnosis, and plan of care documented in the resident's note.

## 2023-11-11 NOTE — Progress Notes (Signed)
 Surgical Instructions   Your procedure is scheduled on Thursday, May 15th. Report to [facility] Main Entrance "A" at 5:30 A.M., then check in with the Admitting office. Any questions or running late day of surgery: call [phone]  Questions prior to your surgery date: call [phone], Monday-Friday, 8am-4pm. If you experience any cold or flu symptoms such as cough, fever, chills, shortness of breath, etc. between now and your scheduled surgery, please notify us  at the above number.     Remember:  Do not eat after midnight the night before your surgery   You may drink clear liquids until 4:30 the morning of your surgery.   Clear liquids allowed are: Water , Non-Citrus Juices (without pulp), Carbonated Beverages, Clear Tea (no milk, honey, etc.), Black Coffee Only (NO MILK, CREAM OR POWDERED CREAMER of any kind), and Gatorade.    Patient Instructions  The night before surgery:  No food after midnight. ONLY clear liquids after midnight  The day of surgery:  Drink ONE (1) Pre-Surgery Clear Ensure by 4:30 the morning of surgery. Drink in one sitting. Do not sip.  This drink was given to you during your hospital pre-op appointment visit.  Nothing else to drink after completing the Pre-Surgery Clear Ensure.         If you have questions, please contact your surgeon's office.   Take these medicines the morning of surgery with A SIP OF WATER   acetaminophen  (TYLENOL )  gabapentin  (NEURONTIN )  metoprolol  succinate (TOPROL -XL)   Follow your surgeon's instructions on when to stop buprenorphine -naloxone  (SUBOXONE ).  If no instructions were given by your surgeon then you will need to call the office to get those instructions.     One week prior to surgery, STOP taking any Aspirin (unless otherwise instructed by your surgeon) Aleve , Naproxen , Motrin , Goody's, BC's, all herbal medications, fish oil, and non-prescription vitamins. This includes your ibuprofen  (ADVIL ) and diclofenac  Sodium  (VOLTAREN ) gel.                     Do NOT Smoke (Tobacco/Vaping) for 24 hours prior to your procedure.  If you use a CPAP at night, you may bring your mask/headgear for your overnight stay.   You will be asked to remove any contacts, glasses, piercing's, hearing aid's, dentures/partials prior to surgery. Please bring cases for these items if needed.    Patients discharged the day of surgery will not be allowed to drive home, and someone needs to stay with them for 24 hours.  SURGICAL WAITING ROOM VISITATION Patients may have no more than 2 support people in the waiting area - these visitors may rotate.   Pre-op nurse will coordinate an appropriate time for 1 ADULT support person, who may not rotate, to accompany patient in pre-op.  Children under the age of 29 must have an adult with them who is not the patient and must remain in the main waiting area with an adult.  If the patient needs to stay at the hospital during part of their recovery, the visitor guidelines for inpatient rooms apply.  Please refer to the [facility] website for the visitor guidelines for any additional information.   If you received a COVID test during your pre-op visit  it is requested that you wear a mask when out in public, stay away from anyone that may not be feeling well and notify your surgeon if you develop symptoms. If you have been in contact with anyone that has tested positive in the last  10 days please notify you surgeon.      Pre-operative CHG Bathing Instructions   You can play a key role in reducing the risk of infection after surgery. Your skin needs to be as free of germs as possible. You can reduce the number of germs on your skin by washing with CHG (chlorhexidine  gluconate) soap before surgery. CHG is an antiseptic soap that kills germs and continues to kill germs even after washing.   DO NOT use if you have an allergy to chlorhexidine /CHG or antibacterial soaps. If your skin becomes  reddened or irritated, stop using the CHG and notify one of our RNs at [phone].              TAKE A SHOWER THE NIGHT BEFORE SURGERY AND THE DAY OF SURGERY    Please keep in mind the following:  DO NOT shave, including legs and underarms, 48 hours prior to surgery.   You may shave your face before/day of surgery.  Place clean sheets on your bed the night before surgery Use a clean washcloth (not used since being washed) for each shower. DO NOT sleep with pet's night before surgery.  CHG Shower Instructions:  Wash your face and private area with normal soap. If you choose to wash your hair, wash first with your normal shampoo.  After you use shampoo/soap, rinse your hair and body thoroughly to remove shampoo/soap residue.  Turn the water  OFF and apply half the bottle of CHG soap to a CLEAN washcloth.  Apply CHG soap ONLY FROM YOUR NECK DOWN TO YOUR TOES (washing for 3-5 minutes)  DO NOT use CHG soap on face, private areas, open wounds, or sores.  Pay special attention to the area where your surgery is being performed.  If you are having back surgery, having someone wash your back for you may be helpful. Wait 2 minutes after CHG soap is applied, then you may rinse off the CHG soap.  Pat dry with a clean towel  Put on clean pajamas    Additional instructions for the day of surgery: DO NOT APPLY any lotions, deodorants, or cologne.   Do not wear jewelry Do not bring valuables to the hospital. [facility] is not responsible for valuables/personal belongings. Put on clean/comfortable clothes.  Please brush your teeth.  Ask your nurse before applying any prescription medications to the skin.

## 2023-11-12 ENCOUNTER — Encounter (HOSPITAL_COMMUNITY): Payer: Self-pay

## 2023-11-12 ENCOUNTER — Encounter (HOSPITAL_COMMUNITY)
Admission: RE | Admit: 2023-11-12 | Discharge: 2023-11-12 | Disposition: A | Discharge status: Home / Self Care | Source: Ambulatory Visit | Attending: General Surgery | Admitting: General Surgery

## 2023-11-12 ENCOUNTER — Other Ambulatory Visit (HOSPITAL_COMMUNITY): Payer: Self-pay

## 2023-11-12 ENCOUNTER — Ambulatory Visit: Admitting: Family Medicine

## 2023-11-12 ENCOUNTER — Other Ambulatory Visit (HOSPITAL_COMMUNITY)

## 2023-11-12 ENCOUNTER — Other Ambulatory Visit: Payer: Self-pay

## 2023-11-12 VITALS — BP 109/75 | HR 81 | Temp 97.7°F | Resp 18 | Ht 71.0 in | Wt 187.0 lb

## 2023-11-12 DIAGNOSIS — G4733 Obstructive sleep apnea (adult) (pediatric): Secondary | ICD-10-CM | POA: Diagnosis not present

## 2023-11-12 DIAGNOSIS — I480 Paroxysmal atrial fibrillation: Secondary | ICD-10-CM | POA: Diagnosis not present

## 2023-11-12 DIAGNOSIS — K4020 Bilateral inguinal hernia, without obstruction or gangrene, not specified as recurrent: Secondary | ICD-10-CM | POA: Insufficient documentation

## 2023-11-12 DIAGNOSIS — E7404 McArdle disease: Secondary | ICD-10-CM | POA: Diagnosis not present

## 2023-11-12 DIAGNOSIS — I509 Heart failure, unspecified: Secondary | ICD-10-CM | POA: Diagnosis not present

## 2023-11-12 DIAGNOSIS — Z01818 Encounter for other preprocedural examination: Secondary | ICD-10-CM | POA: Diagnosis not present

## 2023-11-12 DIAGNOSIS — I110 Hypertensive heart disease with heart failure: Secondary | ICD-10-CM | POA: Diagnosis not present

## 2023-11-12 DIAGNOSIS — I2510 Atherosclerotic heart disease of native coronary artery without angina pectoris: Secondary | ICD-10-CM | POA: Insufficient documentation

## 2023-11-12 DIAGNOSIS — M4802 Spinal stenosis, cervical region: Secondary | ICD-10-CM | POA: Insufficient documentation

## 2023-11-12 HISTORY — DX: McArdle disease: E74.04

## 2023-11-12 LAB — BASIC METABOLIC PANEL WITH GFR
Anion gap: 9 (ref 5–15)
BUN: 22 mg/dL — ABNORMAL HIGH (ref 6–20)
CO2: 29 mmol/L (ref 22–32)
Calcium: 9.8 mg/dL (ref 8.9–10.3)
Chloride: 99 mmol/L (ref 98–111)
Creatinine, Ser: 0.69 mg/dL (ref 0.61–1.24)
GFR, Estimated: >60 mL/min (ref >60)
Glucose, Bld: 88 mg/dL (ref 70–99)
Potassium: 3.7 mmol/L (ref 3.5–5.1)
Sodium: 137 mmol/L (ref 135–145)

## 2023-11-12 LAB — CBC
HCT: 47.6 % (ref 39.0–52.0)
Hemoglobin: 16.3 g/dL (ref 13.0–17.0)
MCH: 32 pg (ref 26.0–34.0)
MCHC: 34.2 g/dL (ref 30.0–36.0)
MCV: 93.3 fL (ref 80.0–100.0)
Platelets: 235 10*3/uL (ref 150–400)
RBC: 5.1 MIL/uL (ref 4.22–5.81)
RDW: 13.2 % (ref 11.5–15.5)
WBC: 6.2 10*3/uL (ref 4.0–10.5)
nRBC: 0 % (ref 0.0–0.2)

## 2023-11-12 LAB — SURGICAL PCR SCREEN
MRSA, PCR: POSITIVE — AB
Staphylococcus aureus: POSITIVE — AB

## 2023-11-12 NOTE — Progress Notes (Signed)
 PCP - Dr. Aurora Lees Cardiologist - Dr. Carson Clara  PPM/ICD - Denies Device Orders - n/a Rep Notified - n/a  Chest x-ray - n/a EKG - 11-12-23 Stress Test - Denies ECHO - 12-25-21 Cardiac Cath - Denies   Sleep Study - Denies CPAP - n/a  Non-diabetic  Last dose of GLP1 agonist-  Denies GLP1 instructions: n/a  Blood Thinner Instructions: Denies Aspirin Instructions: Denies  ERAS Protcol - ERAS till 0430 PRE-SURGERY Ensure or G2 - Ensure   COVID TEST- n/a   Anesthesia review: yes, HTN, EFmrEF, P A-fib  Patient denies shortness of breath, fever, cough and chest pain at PAT appointment. Patient denies any respiratory issues at this time    All instructions explained to the patient, with a verbal understanding of the material. Patient agrees to go over the instructions while at home for a better understanding. Patient also instructed to self quarantine after being tested for COVID-19. The opportunity to ask questions was provided.

## 2023-11-13 NOTE — Progress Notes (Signed)
 Anesthesia Chart Review:  Case: [id number] Date/Time: 11/21/23 0715   Procedure: REPAIR, HERNIA, INGUINAL, LAPAROSCOPIC (Bilateral) - LAPAROSCOPIC BILATERAL INGUINAL HERNIA REPAIR WITH MESH   Anesthesia type: General   Diagnosis: Bilateral inguinal hernia without obstruction or gangrene, recurrence not specified [K40.20]   Pre-op diagnosis: BILATERAL INGUINAL HERNIA   Location: [facility] ROOM 09 / [facility]   Surgeons: Shela Derby, MD       DISCUSSION: Patient is a 58 year old male scheduled for the above procedure. He initially got cardiac clearance for surgery in August 2024 when surgery was planned with Dr. Teddie Favre, but now inguinal hernial repair is scheduled for 11/22/23 with Dr. Melton Squires.  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   Prolonged [place] admission 08/27/20 -09/29/20 (followed by [facility] rehab until 10/06/20) for necrotizing MRSA pneumonia. Required pressure support and intubation. Had tracheostomy from 09/14/20-09/27/20. Seen by cardiology during admission due to new afib, demand ischemia, LV dysfunction with EF 35-40% with global hypokinesis, moderately dilated LV, moderate RVH, mildly reduced RV function, RVSP 36.5 mmHg. Aaron AasHe converted to SR on amiodarone . TEE on 09/16/20 showed no evidence of endocarditis, likely small PFO,  EF 45-50%, low normal RV function. Also noted on CT to have 4.6 cm thoracic aortic aneurysm. Follow-up cardiac monitor x2 weeks showed no significant arrhythmias on 12/20/20. Repeat echo on 12/25/21 showed EF  60-65%, no RWMA, normal RV systolic function, normal PASP, RVSP 16.2 mmHg, no significant valvular disease,. Coronary CTA on 04/26/22 showed minimal nonobstructive CAD, calcium  score 271 (92nd percentile), ascending thoracic aorta 4.4 cm.      He is followed by [facility] neurologist Lucius Sabins since June 2020 for McArdle's disease and necrotizing myopathy. He has had chronic progressive weakness in upper and lower extremities along with myalgia, elevated CK (~1000-5000s). He had previous evaluations by neurologist Reyna Cava, DO and rheumatologist Stefan Edge, MD. By notes, myositis panel was normal in 2019, and genetic testing ([employer] comprehensive muscular dystrophy panel) was "unremarkable."  03/2019 EMG/NCS study showed neuropathic and myopathic changes along with spontaneous activity which can be seen with chronic myopathic process, no myotonic discharges to suggest myotonic dystrophy. 04/2019 muscle biopsy consistent with neurogenic atrophy and necrotizing myopathy. 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 At last visit on 04/11/23 he reported worsening musculoskeletal pains in this left foot and arm with progressive neuropathy in this feet. Brain MRI on 04/12/23 showed small chronic right parietal infarct, but no acute abnormality. 04/12/23 MRI C-spine showed progressive multi-level cervical disc and facet degeneration since 2020, mild spinal stenosis and moderate to severe left neural foraminal stenosis at C6-7, and moderate right and mild left neural foraminal stenosis at C3-4.    His last in-office cardiology visit was on 01/21/23 with Friddie Jetty, NP. He is scheduled to get a chest CT to re-evaluate his TAA on 12/03/23 per Dr. Sherene Dilling. It measured 4.5 cm on CT 10/11/22. As above, preoperative cardiology risk assessment was from 02/21/23 by Lawana Pray, NP-C, but surgery ended up being delayed and rescheduled with another surgeon. He  wrote, "Chart reviewed as part of pre-operative protocol coverage. Given past medical history and time  since last visit, based on ACC/AHA guidelines, Peggy T Eisman would be at acceptable risk for the planned procedure without further cardiovascular testing.    His RCRI is a class II risk, 0.9% risk of major cardiac event."  Complex medical history as outlined and discussed with anesthesiologist Gorman Laughter, MD. Normalization of LVEF and normal LV/RV systolic function by follow-up TTE in 2023. CCTA showed minimal CAD. TAA 4.5 cm with follow-up imaging scheduled at the end of the month. He does have McArdle's disease. According to UpToDate, general anesthesia considerations include: "Severe perioperative problems are rare [59]. Patients with myophosphorylase deficiency do not appear to have increased susceptibility to malignant hyperthermia. However, general anesthesia, particularly with succinylcholine and volatile anesthetics, has the potential to cause acute rhabdomyolysis that can result in myoglobinuria and even acute kidney injury."  Anesthesia team to evaluate on the day of surgery.   VS: BP 109/75   Pulse 81   Temp 36.5 C   Resp 18   Ht 5\' 11"  (1.803 m)   Wt 84.8 kg   SpO2 100%   BMI 26.08 kg/m    PROVIDERS: Aurora Lees, DO is PCP  Carson Clara, MD is cardiologist Lucius Sabins, MD is neurologist ([facility]) Linder Revere, MD is CT surgeon   LABS: Labs reviewed: Acceptable for surgery. (all labs ordered are listed, but only abnormal results are displayed)  Labs Reviewed  SURGICAL PCR SCREEN - Abnormal; Notable for the following components:      Result Value   MRSA, PCR POSITIVE (*)    Staphylococcus aureus POSITIVE (*)    All other components within normal limits  BASIC METABOLIC PANEL WITH GFR - Abnormal; Notable for the following components:   BUN 22 (*)    All other components within normal limits  CBC     SLEEP STUDY 10/23/22 ([facility]): IMPRESSIONS  - The patient did meet split-night criteria.  - DIAGNOSTIC PART  - Severe obstructive sleep apnea occurred during the diagnostic portion of the study  AHI = 41.8-hour .  - Moderate oxygen desaturation was noted during the diagnostic portion of the study  Min O2 =80% .  - No snoring was audible during the diagnostic portion of the study.  - No cardiac abnormalities were noted during this study.  - Moderate periodic limb movements of sleep occurred during the study  PLMI = 24.7-hour . No significant associated arousals.  - TITRATION PART  - PAP pressures 4 to 12 cm H2O.  -  An optimal PAP pressure was selected for this patient   12 cm of water    DIAGNOSIS  - Severe Obstructive Sleep Apnea  327.23 [G47.33 ICD-10]  - Periodic Limb Movement During Sleep  327.51 [G47.61 ICD-10]   RECOMMENDATIONS  - Trial of CPAP therapy on 12 cm H2O with a Large size Resmed Full Face AirFit F20 mask and heated humidification...    IMAGES: MRI C-spine 04/22/23 ([facility]): IMPRESSION: 1. Progressive multilevel cervical disc and facet degeneration since  2020.  2. Mild spinal stenosis and moderate to severe left neural foraminal  stenosis at C6-7.  3. Moderate right and mild left neural foraminal stenosis at C3-4.   MRI Brain 04/22/23 ([facility]): 1. No acute intracranial abnormality.  2. Small chronic right parietal infarct.   CT Chest 10/11/22: IMPRESSION: 1. Resolution of previously noted nodular area of ground-glass attenuation in the right upper lobe compared to the prior study, indicative of a benign infectious or inflammatory process on the prior study.  2. Scattered areas of mild scarring throughout the lung bases bilaterally (right-greater-than-left), presumably sequela of multilobar pneumonia demonstrated on remote prior chest CT 09/13/2020. 3. Aortic atherosclerosis, in addition to left main and 2 vessel coronary artery disease. Please note that although the presence  of coronary artery calcium  documents the presence of coronary artery disease, the severity of this disease and any potential stenosis cannot be assessed on this non-gated CT examination. Assessment for potential risk factor modification, dietary therapy or pharmacologic therapy may be warranted, if clinically indicated. 4. There are calcifications of the aortic valve. Echocardiographic correlation for evaluation of potential valvular dysfunction may be warranted if clinically indicated. 5. Mild aneurysmal dilatation of the ascending thoracic aorta (4.5 cm in diameter). Recommend semi-annual imaging followup by CTA or MRA and referral to cardiothoracic surgery if not already obtained. This recommendation follows 2010 ACCF/AHA/AATS/ACR/ASA/SCA/SCAI/SIR/STS/SVM Guidelines for the Diagnosis and Management of Patients With Thoracic Aortic Disease. Circulation. 2010; 121: X324-M010. Aortic aneurysm NOS (ICD10-I71.9) - Aortic Atherosclerosis (ICD10-I70.0). Aortic aneurysm NOS (ICD10-I71.9).    EKG: 11/12/23: Normal sinus rhythm Possible Left atrial enlargement Minimal voltage criteria for LVH, may be normal variant When compared with ECG of 12-Nov-2023 10:07, No significant change since last tracing Confirmed by Eilleen Grates ([id number]) on 11/12/2023 12:58:02 PM   CV: CT Coronary 04/26/22: FINDINGS: Coronary calcium  score: The patient's coronary artery calcium  score is 271, which places the patient in the 92nd percentile.   Coronary arteries: Normal coronary origins.  Right dominance.  - Right Coronary Artery: Dominant. Minimal mixed proximal stenosis 1-24%.  - Left Main Coronary Artery: Normal. Bifurcates into the LAD and LCX arteries. - Left Anterior Descending Coronary Artery: Large anterior artery with minimal mixed 1-24% proximal stenosis. Large diagonal branch which bifurcates. There is minimal mixed proximal 1-24% stenosis.  - Left Circumflex Artery: Large AV groove LCX vessel  without disease.   Aorta: Dilated to 44 mm at the mid ascending aorta (level of the PA bifurcation) measured double oblique. No calcifications. No dissection. The aortic root is dilated to 39 mm at the sinus of valsalva.   Aortic Valve: Trileaflet.  Punctate leaflet tip calcifications.   Other findings:  Normal pulmonary vein drainage into the left atrium.  Normal left atrial appendage without a thrombus.  Normal size of the pulmonary artery.   IMPRESSION: 1. Minimal mixed non-obstructive CAD, CADRADS = 1. 2. Coronary calcium  score of 271. This was 92nd percentile for age and sex matched control. 3. Normal coronary origin with right dominance. 4. Aorta: Dilated to 44 mm at the mid ascending aorta (level of the PA bifurcation) measured double oblique. No calcifications. No dissection. The aortic root is dilated to 39 mm at the sinus of valsalva. 5. Punctate aortic valve leaflet calcifications. 6. Aggressive cardiovascular risk factor modification is recommended.   Cardiac event monitor 04/22/22 - 04/28/22:   No significant arrhythmias   Predominant rhythm is sinus rhythm. Range is 61-155 bpm with average of 87 bpm. No atrial fibrillation, sustained ventricular tachycardia, significant pause, or high degree AV block. Total ectopy <1%. 3 patient triggered events, corresponding to sinus rhythm. No significant abnormalities.    Echo 12/25/21: IMPRESSIONS   1. Left ventricular ejection fraction, by estimation, is 60 to 65%. The  left ventricle has normal function. The left ventricle has no regional  wall motion abnormalities. Indeterminate diastolic filling due to E-A  fusion.   2. Right ventricular systolic function is normal. The right ventricular  size is normal. There is normal pulmonary artery systolic  pressure. The  estimated right ventricular systolic pressure is 16.2 mmHg.   3. The mitral valve is grossly normal. Trivial mitral valve  regurgitation. No evidence of mitral  stenosis.   4. The aortic valve is tricuspid. Aortic valve regurgitation is not  visualized. Aortic valve sclerosis is present, with no evidence of aortic  valve stenosis.   5. Aortic dilatation noted. There is mild dilatation of the aortic root,  measuring 42 mm. There is moderate dilatation of the ascending aorta,  measuring 43 mm.   6. The inferior vena cava is normal in size with greater than 50%  respiratory variability, suggesting right atrial pressure of 3 mmHg.  - Comparison(s): No significant change from prior study.    TEE 09/16/20 (in setting of MRSA necrotizing PNA): IMPRESSIONS   1. Left ventricular ejection fraction, by estimation, is 45 to 50%. The  left ventricle has mildly decreased function.   2. Right ventricular systolic function is low normal. The right  ventricular size is normal.   3. No left atrial/left atrial appendage thrombus was detected.   4. The mitral valve is normal in structure. Trivial mitral valve  regurgitation. No evidence of mitral stenosis.   5. The aortic valve is tricuspid. Aortic valve regurgitation is not  visualized. No aortic stenosis is present.   6. Evidence of atrial level shunting detected by color flow Doppler.  There is a small patent foramen ovale with predominantly left to right  shunting across the atrial septum.   Conclusion(s)/Recommendation(s): No evidence of vegetation/infective endocarditis on this transesophageal echocardiogram. No evidence of endocarditis. Likely small PFO. Findings discussed with primary team.    Past Medical History:  Diagnosis Date   Acute HFmrEF (heart failure with mildly reduced ejection fraction) (HCC) 10/31/2020   Acute hypoxemic respiratory failure (HCC)    Acute respiratory failure with hypoxia (HCC) 08/28/2020   CAP (community acquired pneumonia) 08/28/2020   Degenerative disc disease, lumbar    Dental crown present    ETOH abuse    History of kidney stones    Hypertension    states under  control with meds., has been on med. x 1 yr. 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   MRSA pneumonia (HCC) 09/09/2020   Muscle atrophy 01/2018   Muscle weakness 01/2018   Necrotizing myopathy    Necrotizing pneumonia (HCC) 09/09/2020   Neuropathic pain    Opioid use disorder    PAF (paroxysmal atrial fibrillation) (HCC)    Pleural effusion    Sepsis (HCC) 08/28/2020   Sleep apnea 10/23/2022   Severe OSA during the diagnostic portion of the study  AHI = 41.8-hour   Thoracic aortic aneurysm (TAA) (HCC)    a. seen on CT 08/2020.    Past Surgical History:  Procedure Laterality Date   ENDOVENOUS ABLATION SAPHENOUS VEIN W/ LASER Right 12-29-2015   endovenous laser ablation right greater saphenous vein, stab phlebectomy > 20 incisions right leg, sclerotherapy right leg by Ouida Bloom MD     MINOR MUSCLE BIOPSY Right 02/11/2018   Procedure: RECTUS FEMORIS MUSCLE BIOPSY;  Surgeon: Oralee Billow, MD;  Location: [facility];  Service: General;  Laterality: Right;   MUSCLE BIOPSY Right 02/11/2018   Procedure: DELTOID MUSCLE BIOPSY;  Surgeon: Oralee Billow, MD;  Location:  SURGERY CENTER;  Service: General;  Laterality:  Right;   NASAL FRACTURE SURGERY     TRACHEOSTOMY TUBE PLACEMENT N/A 09/14/2020   Procedure: TRACHEOSTOMY;  Surgeon: Vernadine Golas, MD;  Location: [facility];  Service: ENT;  Laterality: N/A;    MEDICATIONS:  acetaminophen  (TYLENOL ) 500 MG tablet   buprenorphine -naloxone  (SUBOXONE ) 8-2 mg SUBL SL tablet   chlorthalidone  (HYGROTON ) 25 MG tablet   diclofenac  Sodium (VOLTAREN ) 1 % GEL   gabapentin  (NEURONTIN ) 300 MG capsule   ibuprofen  (ADVIL ) 200 MG tablet   metoprolol  succinate (TOPROL -XL) 50 MG 24 hr tablet   tadalafil  (CIALIS ) 10 MG tablet   testosterone  cypionate (DEPOTESTOSTERONE CYPIONATE) 200 MG/ML  injection   No current facility-administered medications for this encounter.     Ella Gun, PA-C Surgical Short Stay/Anesthesiology [facility] Phone [phone] [facility] Phone [phone] 11/14/2023 11:48 AM

## 2023-11-14 ENCOUNTER — Ambulatory Visit (INDEPENDENT_AMBULATORY_CARE_PROVIDER_SITE_OTHER): Admitting: Family Medicine

## 2023-11-14 ENCOUNTER — Other Ambulatory Visit: Payer: Self-pay

## 2023-11-14 ENCOUNTER — Encounter (HOSPITAL_COMMUNITY): Payer: Self-pay

## 2023-11-14 ENCOUNTER — Other Ambulatory Visit (HOSPITAL_COMMUNITY): Payer: Self-pay

## 2023-11-14 ENCOUNTER — Encounter: Payer: Self-pay | Admitting: Family Medicine

## 2023-11-14 VITALS — BP 122/86 | Ht 71.0 in | Wt 200.0 lb

## 2023-11-14 DIAGNOSIS — M25572 Pain in left ankle and joints of left foot: Secondary | ICD-10-CM

## 2023-11-14 DIAGNOSIS — M216X2 Other acquired deformities of left foot: Secondary | ICD-10-CM | POA: Diagnosis not present

## 2023-11-14 MED ORDER — MUPIROCIN 2 % EX OINT
1.0000 | TOPICAL_OINTMENT | Freq: Three times a day (TID) | CUTANEOUS | 0 refills | Status: AC
  Filled 2023-11-14: qty 22, 22d supply, fill #0

## 2023-11-14 NOTE — Anesthesia Preprocedure Evaluation (Addendum)
 Anesthesia Evaluation  Patient identified by MRN, date of birth, ID band Patient awake    Reviewed: Allergy & Precautions, NPO status , Patient's Chart, lab work & pertinent test results  Airway Mallampati: II  TM Distance: >3 FB Neck ROM: Full    Dental no notable dental hx.    Pulmonary sleep apnea    Pulmonary exam normal        Cardiovascular hypertension, Pt. on medications and Pt. on home beta blockers + CAD   Rhythm:Regular Rate:Normal  CT Coronary 04/26/22: IMPRESSION: 1. Minimal mixed non-obstructive CAD, CADRADS = 1. 2. Coronary calcium  score of 271. This was 92nd percentile for age and sex matched control. 3. Normal coronary origin with right dominance. 4. Aorta: Dilated to 44 mm at the mid ascending aorta (level of the PA bifurcation) measured double oblique. No calcifications. No dissection. The aortic root is dilated to 39 mm at the sinus of valsalva. 5. Punctate aortic valve leaflet calcifications. 6. Aggressive cardiovascular risk factor modification is recommended.  Echo 12/25/21: IMPRESSIONS  1. Left ventricular ejection fraction, by estimation, is 60 to 65%. The  left ventricle has normal function. The left ventricle has no regional  wall motion abnormalities. Indeterminate diastolic filling due to E-A  fusion.  2. Right ventricular systolic function is normal. The right ventricular  size is normal. There is normal pulmonary artery systolic pressure. The  estimated right ventricular systolic pressure is 16.2 mmHg.  3. The mitral valve is grossly normal. Trivial mitral valve  regurgitation. No evidence of mitral stenosis.  4. The aortic valve is tricuspid. Aortic valve regurgitation is not  visualized. Aortic valve sclerosis is present, with no evidence of aortic  valve stenosis.  5. Aortic dilatation noted. There is mild dilatation of the aortic root,  measuring 42 mm. There is moderate  dilatation of the ascending aorta,  measuring 43 mm.  6. The inferior vena cava is normal in size with greater than 50%  respiratory variability, suggesting right atrial pressure of 3 mmHg.  - Comparison(s): No significant change from prior study.       Neuro/Psych negative neurological ROS  negative psych ROS   GI/Hepatic ,,,(+)     substance abuse  Bilateral inguinal hernia    Endo/Other  negative endocrine ROS    Renal/GU negative Renal ROS  negative genitourinary   Musculoskeletal negative musculoskeletal ROS (+)    Abdominal Normal abdominal exam  (+)   Peds  Hematology Lab Results      Component                Value               Date                      WBC                      6.2                 11/12/2023                HGB                      16.3                11/12/2023                HCT  47.6                11/12/2023                MCV                      93.3                11/12/2023                PLT                      235                 11/12/2023             Lab Results      Component                Value               Date                      NA                       137                 11/12/2023                K                        3.7                 11/12/2023                CO2                      29                  11/12/2023                GLUCOSE                  88                  11/12/2023                BUN                      22 (H)              11/12/2023                CREATININE               0.69                11/12/2023                CALCIUM                   9.8                 11/12/2023                EGFR                     112  04/08/2023                GFRNONAA                 >60                 11/12/2023              Anesthesia Other Findings   Reproductive/Obstetrics                             Anesthesia Physical Anesthesia  Plan  ASA: 2  Anesthesia Plan: General   Post-op Pain Management: Tylenol  PO (pre-op)* and Precedex    Induction: Intravenous  PONV Risk Score and Plan: 2 and Ondansetron , Dexamethasone , Midazolam , Treatment may vary due to age or medical condition, Propofol  infusion and TIVA  Airway Management Planned: Mask and Oral ETT  Additional Equipment: None  Intra-op Plan:   Post-operative Plan: Extubation in OR  Informed Consent: I have reviewed the patients History and Physical, chart, labs and discussed the procedure including the risks, benefits and alternatives for the proposed anesthesia with the patient or authorized representative who has indicated his/her understanding and acceptance.     Dental advisory given  Plan Discussed with: CRNA  Anesthesia Plan Comments: (See PAT note written 11/14/2023 by Allison Zelenak, PA-C. History of prolonged hospitalization for MRSA necrotizing PNA (2022), 4.5 cm TAA, polysubstance use (on Suboxone ), necrotizing myopathy with hyperCKemia, McArdle's disease.  According to UpToDate, general anesthesia considerations in McArdle's disease include: "Severe perioperative problems are rare [59]. Patients with myophosphorylase deficiency do not appear to have increased susceptibility to malignant hyperthermia. However, general anesthesia, particularly with succinylcholine and volatile anesthetics, has the potential to cause acute rhabdomyolysis that can result in myoglobinuria and even acute kidney injury."     )        Anesthesia Quick Evaluation

## 2023-11-14 NOTE — Progress Notes (Signed)
 DATE OF VISIT: 11/14/2023        Jeromey T Abello DOB: 08/03/1965 MRN: [id number]  CC:  f/u left foot pain  History of present Illness: Zyking Quiggins [place] is a 58 y.o. male who presents for a follow-up visit for Lt foot pain Last seen by me 06/25/23 Has been wearing custom orthotics Still having pain in the midfoot Having pain in the left 2nd and 3rd toes -feels these toes are rubbing together  He is wondering if his shoes may be too tight and pushing on his feet Did get a new pair of shoes, but orthotics do not fit as well because the internal cushion is built in it cannot be removed Denies any numbness and tingling in the toes Denies any new injury or trauma  Medications:  Outpatient Encounter Medications as of 11/14/2023  Medication Sig   buprenorphine -naloxone  (SUBOXONE ) 8-2 mg SUBL SL tablet Place 1 tablet under the tongue in the morning, at noon, and at bedtime.   chlorthalidone  (HYGROTON ) 25 MG tablet Take 1 tablet (25 mg total) by mouth daily.   diclofenac  Sodium (VOLTAREN ) 1 % GEL Apply 1 gram topically 4 (four) times a day. (Patient taking differently: Apply 1 Application topically 4 (four) times daily as needed (pain.).)   gabapentin  (NEURONTIN ) 300 MG capsule Take 3 capsules (900 mg total) by mouth 3 (three) times daily for Neuropathy   metoprolol  succinate (TOPROL -XL) 50 MG 24 hr tablet Take 1 tablet (50 mg total) by mouth daily. Take with or immediately following a meal.   tadalafil  (CIALIS ) 10 MG tablet Take 1 tablet (10 mg total) by mouth as needed for erectile dysfunction. (Patient not taking: Reported on 11/08/2023)   testosterone  cypionate (DEPOTESTOSTERONE CYPIONATE) 200 MG/ML injection Inject 2 mLs (400 mg total) into the muscle every 14 (fourteen) days.   No facility-administered encounter medications on file as of 11/14/2023.    Allergies: is allergic to ivp dye [iodinated contrast media], lyrica  cr [pregabalin  er], vicodin [hydrocodone-acetaminophen ], and  quinolones.  Physical Examination: Vitals: BP 122/86   Ht 5\' 11"  (1.803 m)   Wt 200 lb (90.7 kg)   BMI 27.89 kg/m  GENERAL:  Rickey T Guillen is a 58 y.o. male appearing their stated age, alert and oriented x 3, in no apparent distress.  SKIN: no rashes or lesions, skin clean, dry, intact MSK: Foot: Bilateral feet with Morton's toes.  Overpronation on the left.  Moderate longitudinal arch collapse bilaterally, left greater than right.  Posterior tibialis function is intact.  Mild transverse arch collapse bilaterally, left greater than right.  He does have rubbing of the left 2nd and 3rd toes.  Mild tenderness palpation along the interdigital space between the 2nd and 3rd toes on the left.  Again noted to have hyperkeratotic nails.   Walking without a limp Neurovascular intact distally Assessment & Plan Pain of joint of left ankle and foot Pain in the left foot with associated worsening collapse of the left transverse arch  Plan: - Left orthotic fitted with medium metatarsal pad today.  This was comfortable and relieved his symptoms - He was given an extra metatarsal pad to place on his sports insoles at home that he uses in his extra shoes/water  shoes - Should continue to use his custom orthotics.  He was able to place this in his new shoes today after he tore out the insole.  These were comfortable - Advised he should wear shoes with wide toebox and flexible toebox when possible - Follow-up if  worsening or not improving, otherwise as needed Loss of transverse plantar arch of left foot Pain in the left foot with associated worsening collapse of the left transverse arch  Plan: - Left orthotic fitted with medium metatarsal pad today.  This was comfortable and relieved his symptoms - He was given an extra metatarsal pad to place on his sports insoles at home that he uses in his extra shoes/water  shoes - Should continue to use his custom orthotics.  He was able to place this in his new shoes  today after he tore out the insole.  These were comfortable - Advised he should wear shoes with wide toebox and flexible toebox when possible - Follow-up if worsening or not improving, otherwise as needed   Patient expressed understanding & agreement with above.  No diagnosis found.  No orders of the defined types were placed in this encounter.

## 2023-11-19 ENCOUNTER — Other Ambulatory Visit (HOSPITAL_COMMUNITY): Payer: Self-pay

## 2023-11-20 ENCOUNTER — Ambulatory Visit: Payer: Self-pay | Admitting: General Surgery

## 2023-11-20 NOTE — H&P (Signed)
 Chief Complaint: New Problem (Left inguinal hernia)       History of Present Illness: Joseph Ayala is a 58 y.o. male who is seen today as an office consultation at the request of Dr. Nathalie Baize for evaluation of New Problem (Left inguinal hernia) .   History of Present Illness Joseph Ayala, a patient with a known inguinal hernia, presents with increasing discomfort and pain. The hernia 'pops out' during activities such as washing dishes and lifting objects, causing significant discomfort. The patient manually reduces the hernia, but reports that it is painful.   In addition to the hernia, the patient has a testicular cyst, which has been evaluated by Urology and deemed not problematic. However, the urologist suggested that the cyst may be contributing to the hernia symptoms. The patient also reports a bulge on the other side of the groin, which has not yet 'popped out' like the known hernia.   The patient is on disability due to a muscle disease, but remains active. The hernia is causing significant discomfort and inhibiting daily activities, leading the patient to seek surgical intervention.       Review of Systems: A complete review of systems was obtained from the patient.  I have reviewed this information and discussed as appropriate with the patient.  See HPI as well for other ROS.   Review of Systems  Constitutional:  Negative for fever.  HENT:  Negative for congestion.   Eyes:  Negative for blurred vision.  Respiratory:  Negative for cough, shortness of breath and wheezing.   Cardiovascular:  Negative for chest pain and palpitations.  Gastrointestinal:  Negative for heartburn.  Genitourinary:  Negative for dysuria.  Musculoskeletal:  Negative for myalgias.  Skin:  Negative for rash.  Neurological:  Negative for dizziness and headaches.  Psychiatric/Behavioral:  Negative for depression and suicidal ideas.   All other systems reviewed and are negative.       Medical History: Past  Medical History Past Medical History: Diagnosis Date  Aneurysm (CMS-HCC)    CHF (congestive heart failure) (CMS/HHS-HCC)    Hyperlipidemia    Hypertension    Substance abuse (CMS/HHS-HCC)         Problem List There is no problem list on file for this patient.     Past Surgical History History reviewed. No pertinent surgical history.     Allergies Allergies Allergen Reactions  Iodinated Contrast Media Hives      Medications Ordered Prior to Encounter Current Outpatient Medications on File Prior to Visit Medication Sig Dispense Refill  acetaminophen  (TYLENOL ) 500 MG tablet Take 1,000 mg by mouth every 6 (six) hours      albuterol  MDI, PROVENTIL , VENTOLIN , PROAIR , HFA 90 mcg/actuation inhaler Inhale 2 Inhalations into the lungs every 6 (six) hours as needed      buprenorphine -naloxone  (SUBOXONE ) 8-2 mg SL film Place under the tongue      chlorthalidone  25 MG tablet Take 25 mg by mouth once daily      ibuprofen  (MOTRIN ) 800 MG tablet        metoprolol  succinate (TOPROL -XL) 50 MG XL tablet Take 50 mg by mouth once daily      testosterone  (VOGELXO ) 12.5 mg/1.25 gram (1 %) gel in metered dose pump        amLODIPine  (NORVASC ) 5 MG tablet Take 5 mg by mouth once daily      DULoxetine  (CYMBALTA ) 30 MG DR capsule Take 30 mg by mouth 2 (two) times daily (Patient not taking: Reported on 10/03/2023)  QUEtiapine  (SEROQUEL ) 300 MG tablet Take by mouth      sildenafiL  (VIAGRA ) 25 MG tablet Take by mouth        No current facility-administered medications on file prior to visit.      Family History History reviewed. No pertinent family history.     Tobacco Use History Social History    Tobacco Use Smoking Status Never Smokeless Tobacco Never      Social History Social History    Socioeconomic History  Marital status: Married Tobacco Use  Smoking status: Never  Smokeless tobacco: Never Substance and Sexual Activity  Alcohol  use: Never  Drug use: Yes     Social Drivers of Manufacturing engineer Strain: Low Risk  (08/28/2023)   Received from [facility]   Overall Financial Resource Strain (CARDIA)    Difficulty of Paying Living Expenses: Not hard at all Food Insecurity: No Food Insecurity (08/28/2023)   Received from [facility]   Hunger Vital Sign    Worried About Running Out of Food in the Last Year: Never true    Ran Out of Food in the Last Year: Never true Transportation Needs: No Transportation Needs (08/28/2023)   Received from [facility] - Transportation    Lack of Transportation (Medical): No    Lack of Transportation (Non-Medical): No Physical Activity: Inactive (08/28/2023)   Received from [facility]   Exercise Vital Sign    Days of Exercise per Week: 0 days    Minutes of Exercise per Session: 0 min Stress: No Stress Concern Present (08/28/2023)   Received from [facility] - Occupational Stress Questionnaire    Feeling of Stress : Not at all Social Connections: Moderately Isolated (08/28/2023)   Received from [facility]   Social Connection and Isolation Panel [NHANES]    Frequency of Communication with Friends and Family: Twice a week    Frequency of Social Gatherings with Friends and Family: Twice a week    Attends Religious Services: Never    Database administrator or Organizations: No    Attends Banker Meetings: Never    Marital Status: Married Housing Stability: Unknown (10/03/2023)   Housing Stability Vital Sign    Homeless in the Last Year: No      Objective:     Vitals:   10/03/23 1107 BP: 122/84 Pulse: 100 Temp: 36.8 C (98.2 F) SpO2: 98% Weight: 86.8 kg (191 lb 6.4 oz) Height: 180.3 cm (5\' 11" ) PainSc: 0-No pain PainLoc: Groin   Body mass index is 26.69 kg/m. Physical Exam Constitutional:      Appearance: Normal appearance.  HENT:     Head: Normocephalic and atraumatic.     Nose: Nose normal. No congestion.      Mouth/Throat:     Mouth: Mucous membranes are moist.     Pharynx: Oropharynx is clear.  Eyes:     Pupils: Pupils are equal, round, and reactive to light.  Cardiovascular:     Rate and Rhythm: Normal rate and regular rhythm.     Pulses: Normal pulses.     Heart sounds: Normal heart sounds. No murmur heard.    No friction rub. No gallop.  Pulmonary:     Effort: Pulmonary effort is normal. No respiratory distress.     Breath sounds: Normal breath sounds. No stridor. No wheezing, rhonchi or rales.  Abdominal:     General: Abdomen is flat.  Hernia: A hernia is present. Hernia is present in the left inguinal area and right inguinal area.  Musculoskeletal:        General: Normal range of motion.     Cervical back: Normal range of motion.  Skin:    General: Skin is warm and dry.  Neurological:     General: No focal deficit present.     Mental Status: He is alert and oriented to person, place, and time.  Psychiatric:        Mood and Affect: Mood normal.        Thought Content: Thought content normal.          Assessment and Plan: Diagnoses and all orders for this visit:   Bilateral inguinal hernia without obstruction or gangrene, recurrence not specified     Joseph Ayala is a 58 y.o. male    1.  We will proceed to the OR for a lap bilateral inguinal hernia repair with mesh. 2. All risks and benefits were discussed with the patient, to generally include infection, bleeding, damage to surrounding structures, acute and chronic nerve pain, and recurrence. Alternatives were offered and described.  All questions were answered and the patient voiced understanding of the procedure and wishes to proceed at this point.             No follow-ups on file.   Shela Derby, MD, [facility], [place] [facility]

## 2023-11-20 NOTE — H&P (View-Only) (Signed)
 Chief Complaint: New Problem (Left inguinal hernia)       History of Present Illness: Joseph Ayala is a 58 y.o. male who is seen today as an office consultation at the request of Dr. Nathalie Baize for evaluation of New Problem (Left inguinal hernia) .   History of Present Illness Joseph Ayala, a patient with a known inguinal hernia, presents with increasing discomfort and pain. The hernia 'pops out' during activities such as washing dishes and lifting objects, causing significant discomfort. The patient manually reduces the hernia, but reports that it is painful.   In addition to the hernia, the patient has a testicular cyst, which has been evaluated by Urology and deemed not problematic. However, the urologist suggested that the cyst may be contributing to the hernia symptoms. The patient also reports a bulge on the other side of the groin, which has not yet 'popped out' like the known hernia.   The patient is on disability due to a muscle disease, but remains active. The hernia is causing significant discomfort and inhibiting daily activities, leading the patient to seek surgical intervention.       Review of Systems: A complete review of systems was obtained from the patient.  I have reviewed this information and discussed as appropriate with the patient.  See HPI as well for other ROS.   Review of Systems  Constitutional:  Negative for fever.  HENT:  Negative for congestion.   Eyes:  Negative for blurred vision.  Respiratory:  Negative for cough, shortness of breath and wheezing.   Cardiovascular:  Negative for chest pain and palpitations.  Gastrointestinal:  Negative for heartburn.  Genitourinary:  Negative for dysuria.  Musculoskeletal:  Negative for myalgias.  Skin:  Negative for rash.  Neurological:  Negative for dizziness and headaches.  Psychiatric/Behavioral:  Negative for depression and suicidal ideas.   All other systems reviewed and are negative.       Medical History: Past  Medical History Past Medical History: Diagnosis Date  Aneurysm (CMS-HCC)    CHF (congestive heart failure) (CMS/HHS-HCC)    Hyperlipidemia    Hypertension    Substance abuse (CMS/HHS-HCC)         Problem List There is no problem list on file for this patient.     Past Surgical History History reviewed. No pertinent surgical history.     Allergies Allergies Allergen Reactions  Iodinated Contrast Media Hives      Medications Ordered Prior to Encounter Current Outpatient Medications on File Prior to Visit Medication Sig Dispense Refill  acetaminophen  (TYLENOL ) 500 MG tablet Take 1,000 mg by mouth every 6 (six) hours      albuterol  MDI, PROVENTIL , VENTOLIN , PROAIR , HFA 90 mcg/actuation inhaler Inhale 2 Inhalations into the lungs every 6 (six) hours as needed      buprenorphine -naloxone  (SUBOXONE ) 8-2 mg SL film Place under the tongue      chlorthalidone  25 MG tablet Take 25 mg by mouth once daily      ibuprofen  (MOTRIN ) 800 MG tablet        metoprolol  succinate (TOPROL -XL) 50 MG XL tablet Take 50 mg by mouth once daily      testosterone  (VOGELXO ) 12.5 mg/1.25 gram (1 %) gel in metered dose pump        amLODIPine  (NORVASC ) 5 MG tablet Take 5 mg by mouth once daily      DULoxetine  (CYMBALTA ) 30 MG DR capsule Take 30 mg by mouth 2 (two) times daily (Patient not taking: Reported on 10/03/2023)  QUEtiapine  (SEROQUEL ) 300 MG tablet Take by mouth      sildenafiL  (VIAGRA ) 25 MG tablet Take by mouth        No current facility-administered medications on file prior to visit.      Family History History reviewed. No pertinent family history.     Tobacco Use History Social History    Tobacco Use Smoking Status Never Smokeless Tobacco Never      Social History Social History    Socioeconomic History  Marital status: Married Tobacco Use  Smoking status: Never  Smokeless tobacco: Never Substance and Sexual Activity  Alcohol  use: Never  Drug use: Yes     Social Drivers of Manufacturing engineer Strain: Low Risk  (08/28/2023)   Received from [facility]   Overall Financial Resource Strain (CARDIA)    Difficulty of Paying Living Expenses: Not hard at all Food Insecurity: No Food Insecurity (08/28/2023)   Received from [facility]   Hunger Vital Sign    Worried About Running Out of Food in the Last Year: Never true    Ran Out of Food in the Last Year: Never true Transportation Needs: No Transportation Needs (08/28/2023)   Received from [facility] - Transportation    Lack of Transportation (Medical): No    Lack of Transportation (Non-Medical): No Physical Activity: Inactive (08/28/2023)   Received from [facility]   Exercise Vital Sign    Days of Exercise per Week: 0 days    Minutes of Exercise per Session: 0 min Stress: No Stress Concern Present (08/28/2023)   Received from [facility] - Occupational Stress Questionnaire    Feeling of Stress : Not at all Social Connections: Moderately Isolated (08/28/2023)   Received from [facility]   Social Connection and Isolation Panel [NHANES]    Frequency of Communication with Friends and Family: Twice a week    Frequency of Social Gatherings with Friends and Family: Twice a week    Attends Religious Services: Never    Database administrator or Organizations: No    Attends Banker Meetings: Never    Marital Status: Married Housing Stability: Unknown (10/03/2023)   Housing Stability Vital Sign    Homeless in the Last Year: No      Objective:     Vitals:   10/03/23 1107 BP: 122/84 Pulse: 100 Temp: 36.8 C (98.2 F) SpO2: 98% Weight: 86.8 kg (191 lb 6.4 oz) Height: 180.3 cm (5\' 11" ) PainSc: 0-No pain PainLoc: Groin   Body mass index is 26.69 kg/m. Physical Exam Constitutional:      Appearance: Normal appearance.  HENT:     Head: Normocephalic and atraumatic.     Nose: Nose normal. No congestion.      Mouth/Throat:     Mouth: Mucous membranes are moist.     Pharynx: Oropharynx is clear.  Eyes:     Pupils: Pupils are equal, round, and reactive to light.  Cardiovascular:     Rate and Rhythm: Normal rate and regular rhythm.     Pulses: Normal pulses.     Heart sounds: Normal heart sounds. No murmur heard.    No friction rub. No gallop.  Pulmonary:     Effort: Pulmonary effort is normal. No respiratory distress.     Breath sounds: Normal breath sounds. No stridor. No wheezing, rhonchi or rales.  Abdominal:     General: Abdomen is flat.  Hernia: A hernia is present. Hernia is present in the left inguinal area and right inguinal area.  Musculoskeletal:        General: Normal range of motion.     Cervical back: Normal range of motion.  Skin:    General: Skin is warm and dry.  Neurological:     General: No focal deficit present.     Mental Status: He is alert and oriented to person, place, and time.  Psychiatric:        Mood and Affect: Mood normal.        Thought Content: Thought content normal.          Assessment and Plan: Diagnoses and all orders for this visit:   Bilateral inguinal hernia without obstruction or gangrene, recurrence not specified     Joseph Ayala is a 58 y.o. male    1.  We will proceed to the OR for a lap bilateral inguinal hernia repair with mesh. 2. All risks and benefits were discussed with the patient, to generally include infection, bleeding, damage to surrounding structures, acute and chronic nerve pain, and recurrence. Alternatives were offered and described.  All questions were answered and the patient voiced understanding of the procedure and wishes to proceed at this point.             No follow-ups on file.   Shela Derby, MD, [facility], [place] [facility]

## 2023-11-21 ENCOUNTER — Ambulatory Visit (HOSPITAL_BASED_OUTPATIENT_CLINIC_OR_DEPARTMENT_OTHER): Payer: Self-pay | Admitting: Anesthesiology

## 2023-11-21 ENCOUNTER — Ambulatory Visit (HOSPITAL_COMMUNITY)
Admission: RE | Admit: 2023-11-21 | Discharge: 2023-11-21 | Disposition: A | Discharge status: Home / Self Care | Attending: General Surgery | Admitting: General Surgery

## 2023-11-21 ENCOUNTER — Other Ambulatory Visit: Payer: Self-pay

## 2023-11-21 ENCOUNTER — Other Ambulatory Visit (HOSPITAL_COMMUNITY): Payer: Self-pay

## 2023-11-21 ENCOUNTER — Ambulatory Visit (HOSPITAL_COMMUNITY): Payer: Self-pay | Admitting: Vascular Surgery

## 2023-11-21 ENCOUNTER — Encounter (HOSPITAL_COMMUNITY): Payer: Self-pay | Admitting: General Surgery

## 2023-11-21 ENCOUNTER — Encounter (HOSPITAL_COMMUNITY)
Admission: RE | Disposition: A | Discharge status: Home / Self Care | Payer: Self-pay | Source: Home / Self Care | Attending: General Surgery

## 2023-11-21 DIAGNOSIS — K4020 Bilateral inguinal hernia, without obstruction or gangrene, not specified as recurrent: Secondary | ICD-10-CM | POA: Diagnosis not present

## 2023-11-21 DIAGNOSIS — I509 Heart failure, unspecified: Secondary | ICD-10-CM | POA: Insufficient documentation

## 2023-11-21 DIAGNOSIS — K429 Umbilical hernia without obstruction or gangrene: Secondary | ICD-10-CM | POA: Diagnosis not present

## 2023-11-21 DIAGNOSIS — I110 Hypertensive heart disease with heart failure: Secondary | ICD-10-CM | POA: Diagnosis not present

## 2023-11-21 HISTORY — PX: UMBILICAL HERNIA REPAIR: SHX196

## 2023-11-21 HISTORY — PX: INGUINAL HERNIA REPAIR: SHX194

## 2023-11-21 SURGERY — REPAIR, HERNIA, INGUINAL, LAPAROSCOPIC
Anesthesia: General | Site: Inguinal

## 2023-11-21 MED ORDER — MIDAZOLAM HCL 2 MG/2ML IJ SOLN
INTRAMUSCULAR | Status: AC
  Filled 2023-11-21: qty 2

## 2023-11-21 MED ORDER — MUPIROCIN 2 % EX OINT
1.0000 | TOPICAL_OINTMENT | Freq: Once | CUTANEOUS | Status: DC
  Filled 2023-11-21: qty 22

## 2023-11-21 MED ORDER — DEXMEDETOMIDINE HCL IN NACL 80 MCG/20ML IV SOLN
INTRAVENOUS | Status: DC | PRN
Start: 2023-11-21 — End: 2023-11-21
  Administered 2023-11-21 (×2): 8 ug via INTRAVENOUS
  Administered 2023-11-21: 4 ug via INTRAVENOUS
  Administered 2023-11-21: 12 ug via INTRAVENOUS

## 2023-11-21 MED ORDER — 0.9 % SODIUM CHLORIDE (POUR BTL) OPTIME
TOPICAL | Status: DC | PRN
  Administered 2023-11-21: 1000 mL

## 2023-11-21 MED ORDER — ARTIFICIAL TEARS OPHTHALMIC OINT
TOPICAL_OINTMENT | OPHTHALMIC | Status: AC
  Filled 2023-11-21: qty 3.5

## 2023-11-21 MED ORDER — PROPOFOL 10 MG/ML IV BOLUS
INTRAVENOUS | Status: DC | PRN
  Administered 2023-11-21: 30 mg via INTRAVENOUS
  Administered 2023-11-21: 60 mg via INTRAVENOUS
  Administered 2023-11-21: 200 mg via INTRAVENOUS
  Administered 2023-11-21: 150 ug/kg/min via INTRAVENOUS

## 2023-11-21 MED ORDER — CHLORHEXIDINE GLUCONATE CLOTH 2 % EX PADS: 6.0000 | MEDICATED_PAD | Freq: Once | CUTANEOUS | Status: DC

## 2023-11-21 MED ORDER — VANCOMYCIN HCL IN DEXTROSE 1-5 GM/200ML-% IV SOLN: 1000.0000 mg | Freq: Once | INTRAVENOUS | Status: AC

## 2023-11-21 MED ORDER — MIDAZOLAM HCL 2 MG/2ML IJ SOLN
INTRAMUSCULAR | Status: DC | PRN
  Administered 2023-11-21 (×2): 2 mg via INTRAVENOUS

## 2023-11-21 MED ORDER — BUPIVACAINE HCL 0.25 % IJ SOLN
INTRAMUSCULAR | Status: DC | PRN
  Administered 2023-11-21: 13 mL

## 2023-11-21 MED ORDER — FENTANYL CITRATE (PF) 250 MCG/5ML IJ SOLN
INTRAMUSCULAR | Status: DC | PRN
  Administered 2023-11-21 (×2): 50 ug via INTRAVENOUS
  Administered 2023-11-21: 100 ug via INTRAVENOUS
  Administered 2023-11-21: 50 ug via INTRAVENOUS

## 2023-11-21 MED ORDER — DEXMEDETOMIDINE HCL IN NACL 80 MCG/20ML IV SOLN
INTRAVENOUS | Status: AC
  Filled 2023-11-21: qty 20

## 2023-11-21 MED ORDER — OXYCODONE HCL 5 MG PO TABS: 5.0000 mg | ORAL_TABLET | Freq: Once | ORAL | Status: DC | PRN

## 2023-11-21 MED ORDER — HYDROMORPHONE HCL 1 MG/ML IJ SOLN
INTRAMUSCULAR | Status: AC
  Filled 2023-11-21: qty 0.5

## 2023-11-21 MED ORDER — ONDANSETRON HCL 4 MG/2ML IJ SOLN
INTRAMUSCULAR | Status: DC | PRN
  Administered 2023-11-21: 4 mg via INTRAVENOUS

## 2023-11-21 MED ORDER — DEXAMETHASONE SODIUM PHOSPHATE 10 MG/ML IJ SOLN
INTRAMUSCULAR | Status: DC | PRN
  Administered 2023-11-21: 10 mg via INTRAVENOUS

## 2023-11-21 MED ORDER — ENSURE PRE-SURGERY PO LIQD: 296.0000 mL | Freq: Once | ORAL | Status: DC

## 2023-11-21 MED ORDER — TRAMADOL HCL 50 MG PO TABS
50.0000 mg | ORAL_TABLET | Freq: Four times a day (QID) | ORAL | 0 refills | Status: DC | PRN
  Filled 2023-11-21: qty 20, 5d supply, fill #0

## 2023-11-21 MED ORDER — PROPOFOL 10 MG/ML IV BOLUS
INTRAVENOUS | Status: AC
  Filled 2023-11-21: qty 20

## 2023-11-21 MED ORDER — ROCURONIUM BROMIDE 10 MG/ML (PF) SYRINGE
PREFILLED_SYRINGE | INTRAVENOUS | Status: DC | PRN
  Administered 2023-11-21: 60 mg via INTRAVENOUS
  Administered 2023-11-21: 10 mg via INTRAVENOUS

## 2023-11-21 MED ORDER — CEFAZOLIN SODIUM-DEXTROSE 2-4 GM/100ML-% IV SOLN: 2.0000 g | INTRAVENOUS | Status: DC

## 2023-11-21 MED ORDER — DEXAMETHASONE SODIUM PHOSPHATE 10 MG/ML IJ SOLN
INTRAMUSCULAR | Status: AC
Start: 2023-11-21 — End: ?
  Filled 2023-11-21: qty 1

## 2023-11-21 MED ORDER — FENTANYL CITRATE (PF) 250 MCG/5ML IJ SOLN
INTRAMUSCULAR | Status: AC
  Filled 2023-11-21: qty 5

## 2023-11-21 MED ORDER — FENTANYL CITRATE (PF) 100 MCG/2ML IJ SOLN
INTRAMUSCULAR | Status: AC
  Filled 2023-11-21: qty 2

## 2023-11-21 MED ORDER — OXYCODONE HCL 5 MG/5ML PO SOLN: 5.0000 mg | Freq: Once | ORAL | Status: DC | PRN

## 2023-11-21 MED ORDER — FENTANYL CITRATE (PF) 100 MCG/2ML IJ SOLN
INTRAMUSCULAR | Status: AC
Start: 2023-11-21 — End: ?
  Filled 2023-11-21: qty 2

## 2023-11-21 MED ORDER — LACTATED RINGERS IV SOLN: INTRAVENOUS | Status: DC

## 2023-11-21 MED ORDER — DROPERIDOL 2.5 MG/ML IJ SOLN: 0.6250 mg | Freq: Once | INTRAMUSCULAR | Status: DC | PRN

## 2023-11-21 MED ORDER — SUGAMMADEX SODIUM 200 MG/2ML IV SOLN
INTRAVENOUS | Status: DC | PRN
  Administered 2023-11-21: 300 mg via INTRAVENOUS

## 2023-11-21 MED ORDER — TRAMADOL HCL 50 MG PO TABS
50.0000 mg | ORAL_TABLET | Freq: Once | ORAL | Status: AC
  Administered 2023-11-21: 50 mg via ORAL

## 2023-11-21 MED ORDER — BUPIVACAINE HCL (PF) 0.25 % IJ SOLN
INTRAMUSCULAR | Status: AC
  Filled 2023-11-21: qty 30

## 2023-11-21 MED ORDER — FENTANYL CITRATE (PF) 100 MCG/2ML IJ SOLN
25.0000 ug | INTRAMUSCULAR | Status: DC | PRN
  Administered 2023-11-21 (×3): 50 ug via INTRAVENOUS

## 2023-11-21 MED ORDER — PROPOFOL 1000 MG/100ML IV EMUL
INTRAVENOUS | Status: AC
Start: 2023-11-21 — End: ?
  Filled 2023-11-21: qty 100

## 2023-11-21 MED ORDER — LIDOCAINE 2% (20 MG/ML) 5 ML SYRINGE
INTRAMUSCULAR | Status: AC
Start: 2023-11-21 — End: ?
  Filled 2023-11-21: qty 5

## 2023-11-21 MED ORDER — TRAMADOL HCL 50 MG PO TABS
ORAL_TABLET | ORAL | Status: AC
  Filled 2023-11-21: qty 1

## 2023-11-21 MED ORDER — ACETAMINOPHEN 500 MG PO TABS
1000.0000 mg | ORAL_TABLET | ORAL | Status: AC
  Administered 2023-11-21: 1000 mg via ORAL
  Filled 2023-11-21: qty 2

## 2023-11-21 MED ORDER — SODIUM CHLORIDE 0.9 % IV SOLN: INTRAVENOUS | Status: DC | PRN

## 2023-11-21 MED ORDER — VANCOMYCIN HCL IN DEXTROSE 1-5 GM/200ML-% IV SOLN
INTRAVENOUS | Status: AC
  Administered 2023-11-21: 1000 mg via INTRAVENOUS
  Filled 2023-11-21: qty 200

## 2023-11-21 MED ORDER — ROCURONIUM BROMIDE 10 MG/ML (PF) SYRINGE
PREFILLED_SYRINGE | INTRAVENOUS | Status: AC
  Filled 2023-11-21: qty 10

## 2023-11-21 MED ORDER — CHLORHEXIDINE GLUCONATE 0.12 % MT SOLN
15.0000 mL | Freq: Once | OROMUCOSAL | Status: AC
  Administered 2023-11-21: 15 mL via OROMUCOSAL
  Filled 2023-11-21: qty 15

## 2023-11-21 MED ORDER — ONDANSETRON HCL 4 MG/2ML IJ SOLN
INTRAMUSCULAR | Status: AC
  Filled 2023-11-21: qty 2

## 2023-11-21 MED ORDER — LIDOCAINE 2% (20 MG/ML) 5 ML SYRINGE
INTRAMUSCULAR | Status: DC | PRN
  Administered 2023-11-21: 80 mg via INTRAVENOUS

## 2023-11-21 MED ORDER — ORAL CARE MOUTH RINSE: 15.0000 mL | Freq: Once | OROMUCOSAL | Status: AC

## 2023-11-21 SURGICAL SUPPLY — 36 items
BAG COUNTER SPONGE SURGICOUNT (BAG) ×3 IMPLANT
CANISTER SUCTION 3000ML PPV (SUCTIONS) IMPLANT
COVER SURGICAL LIGHT HANDLE (MISCELLANEOUS) ×3 IMPLANT
DERMABOND ADVANCED .7 DNX12 (GAUZE/BANDAGES/DRESSINGS) ×3 IMPLANT
DISSECTOR BLUNT TIP ENDO 5MM (MISCELLANEOUS) IMPLANT
ELECTRODE REM PT RTRN 9FT ADLT (ELECTROSURGICAL) ×3 IMPLANT
ENDOLOOP SUT PDS II 0 18 (SUTURE) IMPLANT
GLOVE BIO SURGEON STRL SZ7.5 (GLOVE) ×6 IMPLANT
GOWN STRL REUS W/ TWL LRG LVL3 (GOWN DISPOSABLE) ×6 IMPLANT
GOWN STRL REUS W/ TWL XL LVL3 (GOWN DISPOSABLE) ×3 IMPLANT
IRRIGATION SUCT STRKRFLW 2 WTP (MISCELLANEOUS) IMPLANT
KIT BASIN OR (CUSTOM PROCEDURE TRAY) ×3 IMPLANT
KIT TURNOVER KIT B (KITS) ×3 IMPLANT
MESH 3DMAX 5X7 LT XLRG (Mesh General) IMPLANT
MESH 3DMAX 5X7 RT XLRG (Mesh General) IMPLANT
NDL INSUFFLATION 14GA 120MM (NEEDLE) IMPLANT
NEEDLE INSUFFLATION 14GA 120MM (NEEDLE) ×2 IMPLANT
NS IRRIG 1000ML POUR BTL (IV SOLUTION) ×3 IMPLANT
PAD ARMBOARD POSITIONER FOAM (MISCELLANEOUS) ×6 IMPLANT
PENCIL BUTTON HOLSTER BLD 10FT (ELECTRODE) IMPLANT
RELOAD STAPLE 4.0 BLU F/HERNIA (INSTRUMENTS) IMPLANT
RELOAD STAPLE HERNIA 4.0 BLUE (INSTRUMENTS) ×2 IMPLANT
SCISSORS LAP 5X35 DISP (ENDOMECHANICALS) ×3 IMPLANT
SET TUBE SMOKE EVAC HIGH FLOW (TUBING) ×3 IMPLANT
STAPLER HERNIA 12 8.5 360D (INSTRUMENTS) IMPLANT
SUT ETHIBOND 0 MO6 C/R (SUTURE) IMPLANT
SUT MNCRL AB 4-0 PS2 18 (SUTURE) ×3 IMPLANT
SUT VIC AB 1 CT1 27XBRD ANBCTR (SUTURE) IMPLANT
TOWEL GREEN STERILE (TOWEL DISPOSABLE) ×3 IMPLANT
TOWEL GREEN STERILE FF (TOWEL DISPOSABLE) ×3 IMPLANT
TRAY LAPAROSCOPIC MC (CUSTOM PROCEDURE TRAY) ×3 IMPLANT
TROCAR OPTICAL SHORT 5MM (TROCAR) ×3 IMPLANT
TROCAR OPTICAL SLV SHORT 5MM (TROCAR) ×3 IMPLANT
TROCAR Z THREAD OPTICAL 12X100 (TROCAR) ×3 IMPLANT
WARMER LAPAROSCOPE (MISCELLANEOUS) ×3 IMPLANT
WATER STERILE IRR 1000ML POUR (IV SOLUTION) ×3 IMPLANT

## 2023-11-21 NOTE — Anesthesia Postprocedure Evaluation (Signed)
 Anesthesia Post Note  Patient: Joseph Ayala  Procedure(s) Performed: REPAIR, HERNIA, INGUINAL, LAPAROSCOPIC (Bilateral: Inguinal) REPAIR, HERNIA, UMBILICAL, LAPAROSCOPIC (Abdomen)     Patient location during evaluation: PACU Anesthesia Type: General Level of consciousness: awake and alert Pain management: pain level controlled Vital Signs Assessment: post-procedure vital signs reviewed and stable Respiratory status: spontaneous breathing, nonlabored ventilation, respiratory function stable and patient connected to nasal cannula oxygen Cardiovascular status: blood pressure returned to baseline and stable Postop Assessment: no apparent nausea or vomiting Anesthetic complications: no   There were no known notable events for this encounter.  Last Vitals:  Vitals:   11/21/23 1000 11/21/23 1010  BP: 121/89 120/85  Pulse: 62 60  Resp: 16 18  Temp:  36.4 C  SpO2: 96% 92%    Last Pain:  Vitals:   11/21/23 1000  TempSrc:   PainSc: 4                  Tayson Schnelle P Dreyton Roessner

## 2023-11-21 NOTE — Interval H&P Note (Signed)
 History and Physical Interval Note:  11/21/2023 7:09 AM  Joseph Ayala  has presented today for surgery, with the diagnosis of BILATERAL INGUINAL HERNIA.  The various methods of treatment have been discussed with the patient and family. After consideration of risks, benefits and other options for treatment, the patient has consented to  Procedure(s) with comments: REPAIR, HERNIA, INGUINAL, LAPAROSCOPIC (Bilateral) - LAPAROSCOPIC BILATERAL INGUINAL HERNIA REPAIR WITH MESH as a surgical intervention.  The patient's history has been reviewed, patient examined, no change in status, stable for surgery.  I have reviewed the patient's chart and labs.  Questions were answered to the patient's satisfaction.     Brenen Beigel

## 2023-11-21 NOTE — Transfer of Care (Signed)
 Immediate Anesthesia Transfer of Care Note  Patient: Joseph Ayala  Procedure(s) Performed: REPAIR, HERNIA, INGUINAL, LAPAROSCOPIC (Bilateral: Inguinal) REPAIR, HERNIA, UMBILICAL, LAPAROSCOPIC (Abdomen)  Patient Location: PACU  Anesthesia Type:General  Level of Consciousness: awake  Airway & Oxygen Therapy: Patient connected to face mask oxygen  Post-op Assessment: Report given to RN, Post -op Vital signs reviewed and stable, and Patient moving all extremities X 4  Post vital signs: Reviewed and stable  Last Vitals:  Vitals Value Taken Time  BP 141/106 11/21/23 0900  Temp    Pulse 69 11/21/23 0858  Resp 18 11/21/23 0901  SpO2 100 % 11/21/23 0858  Vitals shown include unfiled device data.  Last Pain:  Vitals:   11/21/23 0604  TempSrc:   PainSc: 0-No pain         Complications: There were no known notable events for this encounter.

## 2023-11-21 NOTE — Anesthesia Procedure Notes (Signed)
 Procedure Name: Intubation Date/Time: 11/21/2023 7:42 AM  Performed by: Jamas Maywood, CRNAPre-anesthesia Checklist: Patient identified, Emergency Drugs available, Suction available and Patient being monitored Patient Re-evaluated:Patient Re-evaluated prior to induction Oxygen Delivery Method: Circle system utilized Preoxygenation: Pre-oxygenation with 100% oxygen Induction Type: IV induction Ventilation: Mask ventilation without difficulty Laryngoscope Size: Mac and 4 Grade View: Grade I Tube type: Oral Tube size: 7.5 mm Number of attempts: 1 Airway Equipment and Method: Stylet and Oral airway Placement Confirmation: ETT inserted through vocal cords under direct vision, positive ETCO2 and breath sounds checked- equal and bilateral Secured at: 22 cm Tube secured with: Tape Dental Injury: Teeth and Oropharynx as per pre-operative assessment

## 2023-11-21 NOTE — Discharge Instructions (Signed)
 [facility] _______[facility]   INGUINAL HERNIA REPAIR: POST OP INSTRUCTIONS  Always review your discharge instruction sheet given to you by the facility where your surgery was performed. IF YOU HAVE DISABILITY OR FAMILY LEAVE FORMS, YOU MUST BRING THEM TO THE OFFICE FOR PROCESSING.   DO NOT GIVE THEM TO YOUR DOCTOR.  1. A  prescription for pain medication may be given to you upon discharge.  Take your pain medication as prescribed, if needed.  If narcotic pain medicine is not needed, then you may take acetaminophen (Tylenol) or ibuprofen (Advil) as needed. 2. Take your usually prescribed medications unless otherwise directed. If you need a refill on your pain medication, please contact your pharmacy.  They will contact our office to request authorization. Prescriptions will not be filled after 5 pm or on week-ends. 3. You should follow a light diet the first 24 hours after arrival home, such as soup and crackers, etc.  Be sure to include lots of fluids daily.  Resume your normal diet the day after surgery. 4.Most patients will experience some swelling and bruising around the umbilicus or in the groin and scrotum.  Ice packs and reclining will help.  Swelling and bruising can take several days to resolve.  6. It is common to experience some constipation if taking pain medication after surgery.  Increasing fluid intake and taking a stool softener (such as Colace) will usually help or prevent this problem from occurring.  A mild laxative (Milk of Magnesia or Miralax) should be taken according to package directions if there are no bowel movements after 48 hours. 7. Unless discharge instructions indicate otherwise, you may remove your bandages 24-48 hours after surgery, and you may shower at that time.  You may have steri-strips (small skin tapes) in place directly over the incision.  These strips should be left on the skin for 7-10 days.  If your surgeon used skin glue on the incision, you may  shower in 24 hours.  The glue will flake off over the next 2-3 weeks.  Any sutures or staples will be removed at the office during your follow-up visit. 8. ACTIVITIES:  You may resume regular (light) daily activities beginning the next day--such as daily self-care, walking, climbing stairs--gradually increasing activities as tolerated.  You may have sexual intercourse when it is comfortable.  Refrain from any heavy lifting or straining until approved by your doctor.  a.You may drive when you are no longer taking prescription pain medication, you can comfortably wear a seatbelt, and you can safely maneuver your car and apply brakes. b.RETURN TO WORK:   _____________________________________________  9.You should see your doctor in the office for a follow-up appointment approximately 2-3 weeks after your surgery.  Make sure that you call for this appointment within a day or two after you arrive home to insure a convenient appointment time. 10.OTHER INSTRUCTIONS: _________________________    _____________________________________  WHEN TO CALL YOUR DOCTOR: Fever over 101.0 Inability to urinate Nausea and/or vomiting Extreme swelling or bruising Continued bleeding from incision. Increased pain, redness, or drainage from the incision  The clinic staff is available to answer your questions during regular business hours.  Please don't hesitate to call and ask to speak to one of the nurses for clinical concerns.  If you have a medical emergency, go to the nearest emergency room or call 911.  A surgeon from [facility] is always on call at the hospital   [address], [place],  [place]  [address] ?  [address] [address], [place], [place]   [address] [phone] ? [phone] ? FAX [phone] Web site: [email or web address]

## 2023-11-21 NOTE — Op Note (Signed)
 11/21/2023  8:33 AM  PATIENT:  Joseph Ayala  58 y.o. male  PRE-OPERATIVE DIAGNOSIS:  BILATERAL INGUINAL HERNIA and umbilical hernia  POST-OPERATIVE DIAGNOSIS:  BILATERAL DIRECT INGUINAL HERNIA, less than 1 cm umbilical hernia  PROCEDURE:  Procedure(s) with comments: REPAIR, HERNIA, INGUINAL, LAPAROSCOPIC (Bilateral) - LAPAROSCOPIC BILATERAL INGUINAL HERNIA REPAIR WITH MESH Primary umbilical hernia repair  SURGEON:  Surgeons and Role:    Shela Derby, MD - Primary  ASSISTANTS: Hortense Lyons, RNFA   ANESTHESIA:   local and general  EBL:  minimal   BLOOD ADMINISTERED:none  DRAINS: none   LOCAL MEDICATIONS USED:  BUPIVICAINE   SPECIMEN:  No Specimen  DISPOSITION OF SPECIMEN:  N/A  COUNTS:  YES  TOURNIQUET:  * No tourniquets in log *  DICTATION: .Dragon Dictation    Counts: reported as correct x 2  Findings:  The patient had a small righ t& left large direct hernias  Indications for procedure:  The patient is a 58 year old male with a bilateral hernias for several months. Patient complained of symptomatology to his bilateral inguinal areas. The patient was taken back for elective inguinal hernia repair.  Details of the procedure:   The patient was taken back to the operating room. The patient was placed in supine position with bilateral SCDs in place.  The patient underwent GETA.  The patient was prepped and draped in the usual sterile fashion.  After appropriate anitbiotics were confirmed, a time-out was confirmed and all facts were verified.  0.25% Marcaine  was used to infiltrate the umbilical area. A 11-blade was used to cut down the skin and blunt dissection was used to get the anterior fashion.  The anterior fascia was incised approximately 1 cm and the muscles were retracted laterally. Blunt dissection was then used to create a space in the preperitoneal area. At this time a 10 mm camera was then introduced into the space and advanced the pubic tubercle and  a 12 mm trocar was placed over this and insufflation was started.   At this time and space was created from medial to laterally the preperitoneal space on the right side.  Cooper's ligament was initially cleaned off. A small direct hernia was seen.  The preperitoneal fat was dissected away and the transversalis fascia spontaneously reduced.  The spermatic cord was identified and dissected away from the cremesteric muscle fibers.  This was circumferentially dissected. The hernia was no hernia seen in the indirect space. Dissection of the hernia sac was undertaken the vas deferens was identified and protected in all parts of the case.  The peritoneum was dissected back to approximate level of the umbilicus bilaterally.  We turned our attention to the left side.  I continued to clean up Cooper's ligament laterally.  At this time there was a large direct hernia seen.  The preperitoneal fat was then dissected away.  The transversalis fascia retracted spontaneously.  I continued dissection laterally.  The spermatic cord was visualized.  This was circumferentially dissected away.  The vas deferens were identified and protected all portions of the case.  I then was able to dissect back the peritoneum.  A pocket was made laterally for the mesh.  A Bard 3D Max mesh, size: XLarge, right and left side, was  introduced into the preperitoneal space.  The mesh was brought over to cover the direct and indirect and femoral hernia spaces.  This was anchored into place and secured to Cooper's ligament with 4.57mm staples from a Coviden hernia stapler. It  was anchored to the anterior abdominal wall with 4.8 mm staples. The hernia sac was seen lying posterior to the mesh. There was no staples placed laterally.   The insufflation was evacuated and the peritoneum was seen posterior to the mesh bilaterally. The trochars were removed. The anterior fascia was reapproximated using #1 Vicryl on a UR- 6.   At this time I took off the  umbilicus from the anterior fascia with a Kelly clamp.  This was circumferentially dissected.  There was a less than 1 cm umbilical hernia with preperitoneal fat.  This was dissected away.  At this time a 0 Vicryl was used x 2 to reapproximate the umbilical hernia site.  The medical stalk was then reattached back to the anterior fascia using an 0 Vicryl x 1.  The skin was reapproximated using 4-0 Monocryl subcuticular fashion and was dressed with Dermabond. The  patient was awakened from general anesthesia and taken to recovery in stable condition.   PLAN OF CARE: Discharge to home after PACU  PATIENT DISPOSITION:  PACU - hemodynamically stable.   Delay start of Pharmacological VTE agent (>24hrs) due to surgical blood loss or risk of bleeding: not applicable

## 2023-11-22 ENCOUNTER — Encounter (HOSPITAL_COMMUNITY): Payer: Self-pay | Admitting: General Surgery

## 2023-11-25 ENCOUNTER — Telehealth: Payer: Self-pay | Admitting: *Deleted

## 2023-11-25 NOTE — Telephone Encounter (Signed)
 Copied from CRM [id number]. Topic: Clinical - Medication Question >> Nov 21, 2023 10:20 AM Joseph Ayala wrote: Reason for CRM: Patients wife called would like a callback from provider because she stated that the patient got prescribed tramadol  after surgery but the patient takes Suboxone  and she wants to know does he need to take both, just one or stop one and finish back when the other one is done, patient [phone]. >> Nov 22, 2023  9:06 AM Joseph Ayala wrote: Patient wife Joseph Ayala calling on update that was put in 05/15 as high priority. Needs call back ASAP [phone] or [phone]  >> Nov 21, 2023  3:46 PM Joseph Ayala wrote: Patient's spouse Joseph Ayala has called in regards to the patient just having surgery given ultram  and currently takes suboxone . Pharmacist advised the patient to speak with someone before giving the patient the pain medication. The patient is currently in pain but she doesn't know what to do and doesn't want the medication to counteract one another

## 2023-11-26 NOTE — Telephone Encounter (Signed)
 I called pt - vm has not been set-up, unable to leave a message. I also called pt's wife, no answer, left message of office's return call.

## 2023-11-26 NOTE — Telephone Encounter (Signed)
 Copied from CRM [id number]. Topic: Clinical - Medication Question >> Nov 21, 2023 10:20 AM Shelby Dessert H wrote: Reason for CRM: Patients wife called would like a callback from provider because she stated that the patient got prescribed tramadol  after surgery but the patient takes Suboxone  and she wants to know does he need to take both, just one or stop one and finish back when the other one is done, patient [phone]. >> Nov 26, 2023  1:47 PM Retta Caster wrote: Patient wife Ginger called and stated Surgeon already called to answer this questions. No need for call back >> Nov 22, 2023  9:06 AM Retta Caster wrote: Patient wife Ginger calling on update that was put in 05/15 as high priority. Needs call back ASAP [phone] or [phone]  >> Nov 21, 2023  3:46 PM Suzette B wrote: Patient's spouse Rosalva Comber has called in regards to the patient just having surgery given ultram  and currently takes suboxone . Pharmacist advised the patient to speak with someone before giving the patient the pain medication. The patient is currently in pain but she doesn't know what to do and doesn't want the medication to counteract one another

## 2023-11-29 ENCOUNTER — Encounter: Payer: Self-pay | Admitting: Surgery

## 2023-12-03 ENCOUNTER — Other Ambulatory Visit (HOSPITAL_COMMUNITY): Payer: Self-pay

## 2023-12-03 ENCOUNTER — Telehealth: Payer: Self-pay | Admitting: *Deleted

## 2023-12-03 ENCOUNTER — Inpatient Hospital Stay: Admission: RE | Admit: 2023-12-03 | Source: Ambulatory Visit

## 2023-12-03 ENCOUNTER — Other Ambulatory Visit: Payer: Self-pay | Admitting: *Deleted

## 2023-12-03 ENCOUNTER — Other Ambulatory Visit: Payer: Self-pay | Admitting: Student

## 2023-12-03 ENCOUNTER — Encounter: Payer: Self-pay | Admitting: Student in an Organized Health Care Education/Training Program

## 2023-12-03 DIAGNOSIS — Z91041 Radiographic dye allergy status: Secondary | ICD-10-CM

## 2023-12-03 MED ORDER — PREDNISONE 50 MG PO TABS
50.0000 mg | ORAL_TABLET | Freq: Three times a day (TID) | ORAL | 0 refills | Status: DC
  Filled 2023-12-03: qty 3, 1d supply, fill #0

## 2023-12-03 MED ORDER — PREDNISONE 50 MG PO TABS
50.0000 mg | ORAL_TABLET | Freq: Three times a day (TID) | ORAL | 0 refills | Status: DC
Start: 2023-12-03 — End: 2024-02-27
  Filled 2023-12-03: qty 3, 1d supply, fill #0

## 2023-12-03 NOTE — Telephone Encounter (Signed)
 Prednisone  rx sent by dr Abner Kierstan Auer

## 2023-12-03 NOTE — Progress Notes (Signed)
 Patient called stating he has his annual CTA today at 1 pm. States contrast dye gives patient a rash. Advised patient to reschedule scan until prophylactic meds can be taken. Advised patient to take prednisone  as prescribed in addition to 50mg  of Benadryl  1 hour prior to CTA. Advised patient to contact our office is follow up with PA needs to be rescheduled. Patient verbalized understanding.

## 2023-12-03 NOTE — Telephone Encounter (Signed)
 Patient is requesting prednisone  for 2 days due to having allergic reaction to CT dye, patient has a CT appointment later today with contrast.

## 2023-12-03 NOTE — Telephone Encounter (Signed)
 This patient's PCP is at the [facility] now. I will forward this request for prednisone  to them.

## 2023-12-03 NOTE — Telephone Encounter (Signed)
 Copied from CRM [id number]. Topic: Clinical - Medication Question >> Dec 03, 2023 11:17 AM Blair Bumpers wrote: Reason for CRM: Patient called in stating that he has to have a MRI done today at 1:20PM. He states he's allergic to the dye contrast and always have an allergic reaction to it after having it. He's asking to have a prescription for prednisone  sent to his pharmacy. Patient is asking if Dr. Sharlon Deacon or someone from team can give him a call or either check MyChart. He left a message there. CB #: [phone].

## 2023-12-04 ENCOUNTER — Ambulatory Visit
Admission: RE | Admit: 2023-12-04 | Discharge: 2023-12-04 | Discharge status: Home / Self Care | Source: Ambulatory Visit | Attending: Surgery | Admitting: Surgery

## 2023-12-04 DIAGNOSIS — I7121 Aneurysm of the ascending aorta, without rupture: Secondary | ICD-10-CM | POA: Diagnosis not present

## 2023-12-04 DIAGNOSIS — I712 Thoracic aortic aneurysm, without rupture, unspecified: Secondary | ICD-10-CM

## 2023-12-04 MED ORDER — IOPAMIDOL (ISOVUE-370) INJECTION 76%
75.0000 mL | Freq: Once | INTRAVENOUS | Status: AC | PRN
  Administered 2023-12-04: 75 mL via INTRAVENOUS

## 2023-12-05 ENCOUNTER — Telehealth: Payer: Self-pay

## 2023-12-05 NOTE — Telephone Encounter (Signed)
 Patient contacted the office today to state he has had red cheeks and chin after receiving contrast dye for yearly follow-up. He does have an allergy to contrast. Patient states that he has been taking Benadryl  and it has improved. Advised to contact the office back if the symptoms do not resolve and to call 911 if he experiences any worsening symptoms or difficulty breathing. He acknowledged receipt.

## 2023-12-09 ENCOUNTER — Ambulatory Visit

## 2023-12-12 ENCOUNTER — Other Ambulatory Visit (HOSPITAL_COMMUNITY): Payer: Self-pay

## 2023-12-13 NOTE — Progress Notes (Signed)
 HPI:  [name] is a 58 year old gentleman with a past history of hypertension, coronary artery disease, dyslipidemia, necrotizing myopathy, and necrotizing pneumonia who was referred to us  last year after he was discovered to have a thoracic aortic aneurysm on a coronary calcium  scoring CT scan.  He reports to the office today for annual follow-up after CT scan obtained last month. Joseph Ayala reports no new health concerns.  He admits to smoking marijuana fairly regular basis to help manage his chronic pain.  He denies chest pain.   Current Outpatient Medications  Medication Sig Dispense Refill   acetaminophen  (TYLENOL ) 500 MG tablet Take 4,000 mg by mouth daily.     buprenorphine -naloxone  (SUBOXONE ) 8-2 mg SUBL SL tablet Place 1 tablet under the tongue in the morning, at noon, and at bedtime. 90 tablet 0   chlorthalidone  (HYGROTON ) 25 MG tablet Take 1 tablet (25 mg total) by mouth daily. 90 tablet 3   diclofenac  Sodium (VOLTAREN ) 1 % GEL Apply 1 gram topically 4 (four) times a day. (Patient taking differently: Apply 1 Application topically 4 (four) times daily as needed (pain.).) 200 g 3   gabapentin  (NEURONTIN ) 300 MG capsule Take 3 capsules (900 mg total) by mouth 3 (three) times daily for Neuropathy 270 capsule 3   ibuprofen  (ADVIL ) 200 MG tablet Take 800 mg by mouth daily.     metoprolol  succinate (TOPROL -XL) 50 MG 24 hr tablet Take 1 tablet (50 mg total) by mouth daily. Take with or immediately following a meal. 90 tablet 3   mupirocin  ointment (BACTROBAN ) 2 % Place 1 Application into the nose 3 (three) times daily for 7 days 22 g 0   predniSONE  (DELTASONE ) 50 MG tablet Take 1 tablet (50 mg total) by mouth 13 hours prior, 7 hours prior and 1 hour prior to CTA scan. 3 tablet 0   tadalafil  (CIALIS ) 10 MG tablet Take 1 tablet (10 mg total) by mouth as needed for erectile dysfunction. (Patient not taking: Reported on 11/08/2023) 20 tablet 1   testosterone  cypionate (DEPOTESTOSTERONE  CYPIONATE) 200 MG/ML injection Inject 2 mLs (400 mg total) into the muscle every 14 (fourteen) days. 10 mL 1   traMADol  (ULTRAM ) 50 MG tablet Take 1 tablet (50 mg total) by mouth every 6 (six) hours as needed. 20 tablet 0   No current facility-administered medications for this visit.    Physical Exam Vital signs BP 116/77 Heart rate 87 Respirations 18 SpO2 95% on room air  General: Pleasant, talkative 58 year old male in no distress. Neck: No JVD or carotid bruits Heart: Regular rate and rhythm, no murmur. Chest: Breath sounds are full, equal, and clear to auscultation. Extremities: Well-perfused with no peripheral edema Neuro: Grossly intact   Diagnostic Tests:  CLINICAL DATA:  Follow-up ascending aortic aneurysm.   EXAM: CT ANGIOGRAPHY CHEST WITH CONTRAST   TECHNIQUE: Multidetector CT imaging of the chest was performed using the standard protocol during bolus administration of intravenous contrast. Multiplanar CT image reconstructions and MIPs were obtained to evaluate the vascular anatomy.   RADIATION DOSE REDUCTION: This exam was performed according to the departmental dose-optimization program which includes automated exposure control, adjustment of the mA and/or kV according to patient size and/or use of iterative reconstruction technique.   CONTRAST:  75mL ISOVUE -370 IOPAMIDOL  (ISOVUE -370) INJECTION 76%   COMPARISON:  Chest CT dated 10/11/2022.   FINDINGS: Evaluation of this exam is limited due to respiratory motion.   Cardiovascular: There is no cardiomegaly or pericardial effusion. There is coronary vascular  calcification. Similar appearance of mildly dilated ascending aorta measuring 4.5 cm in maximal diameter. No aortic dissection. The origins of the great vessels of the aortic arch and the central pulmonary arteries appear patent.   Mediastinum/Nodes: No hilar or mediastinal adenopathy. The esophagus is grossly unremarkable. No mediastinal fluid  collection.   Lungs/Pleura: Right lung base subpleural scarring. No focal consolidation, pleural effusion or pneumothorax with the central airways are patent.   Upper Abdomen: No acute abnormality.   Musculoskeletal: Degenerative changes of the spine. No acute osseous pathology.   Review of the MIP images confirms the above findings.   IMPRESSION: 1. Similar appearance of mildly dilated ascending aorta measuring 4.5 cm in maximal diameter. Ascending thoracic aortic aneurysm. Recommend semi-annual imaging followup by CTA or MRA and referral to cardiothoracic surgery if not already obtained. This recommendation follows 2010 ACCF/AHA/AATS/ACR/ASA/SCA/SCAI/SIR/STS/SVM Guidelines for the Diagnosis and Management of Patients With Thoracic Aortic Disease. Circulation. 2010; 121: X324-M010. Aortic aneurysm NOS (ICD10-I71.9) 2. No acute intrathoracic pathology. 3. Coronary vascular calcification.     Electronically Signed   By: Angus Bark M.D.   On: 12/04/2023 16:38  ECHOCARDIOGRAM REPORT       Patient Name:   Joseph Ayala Date of Exam: 12/25/2021  Medical Rec #:  [id number]      Height:       71.0 in  Accession #:    [id number]     Weight:       211.8 lb  Date of Birth:  December 16, 1965      BSA:          2.161 m  Patient Age:    56 years       BP:           151/98 mmHg  Patient Gender: M              HR:           106 bpm.  Exam Location:  Outpatient   Procedure: 2D Echo, 3D Echo, Cardiac Doppler and Color Doppler   Indications:    Congestive Heart Failure I50.9    History:        Patient has prior history of Echocardiogram examinations,  most                 recent 12/08/2020. Thoracic aortic aneurysm,  Arrythmias:Atrial                 Fibrillation; Risk Factors:Hypertension and ETOH.    Sonographer:    Ruta Cousins RDCS  Referring Phys: [id number] GRACE LAU   IMPRESSIONS     1. Left ventricular ejection fraction, by estimation, is 60 to 65%. The  left ventricle  has normal function. The left ventricle has no regional  wall motion abnormalities. Indeterminate diastolic filling due to E-A  fusion.   2. Right ventricular systolic function is normal. The right ventricular  size is normal. There is normal pulmonary artery systolic pressure. The  estimated right ventricular systolic pressure is 16.2 mmHg.   3. The mitral valve is grossly normal. Trivial mitral valve  regurgitation. No evidence of mitral stenosis.   4. The aortic valve is tricuspid. Aortic valve regurgitation is not  visualized. Aortic valve sclerosis is present, with no evidence of aortic  valve stenosis.   5. Aortic dilatation noted. There is mild dilatation of the aortic root,  measuring 42 mm. There is moderate dilatation of the ascending aorta,  measuring 43 mm.   6. The inferior vena  cava is normal in size with greater than 50%  respiratory variability, suggesting right atrial pressure of 3 mmHg.   Comparison(s): No significant change from prior study.   FINDINGS   Left Ventricle: Left ventricular ejection fraction, by estimation, is 60  to 65%. The left ventricle has normal function. The left ventricle has no  regional wall motion abnormalities. 3D left ventricular ejection fraction  analysis performed but not  reported based on interpreter judgement due to suboptimal tracking. The  left ventricular internal cavity size was normal in size. There is no left  ventricular hypertrophy. Indeterminate diastolic filling due to E-A  fusion.   Right Ventricle: The right ventricular size is normal. No increase in  right ventricular wall thickness. Right ventricular systolic function is  normal. There is normal pulmonary artery systolic pressure. The tricuspid  regurgitant velocity is 1.82 m/s, and   with an assumed right atrial pressure of 3 mmHg, the estimated right  ventricular systolic pressure is 16.2 mmHg.   Left Atrium: Left atrial size was normal in size.   Right Atrium:  Right atrial size was normal in size.   Pericardium: There is no evidence of pericardial effusion.   Mitral Valve: The mitral valve is grossly normal. Trivial mitral valve  regurgitation. No evidence of mitral valve stenosis.   Tricuspid Valve: The tricuspid valve is grossly normal. Tricuspid valve  regurgitation is trivial. No evidence of tricuspid stenosis.   Aortic Valve: The aortic valve is tricuspid. Aortic valve regurgitation is  not visualized. Aortic valve sclerosis is present, with no evidence of  aortic valve stenosis.   Pulmonic Valve: The pulmonic valve was grossly normal. Pulmonic valve  regurgitation is not visualized. No evidence of pulmonic stenosis.   Aorta: Aortic dilatation noted. There is mild dilatation of the aortic  root, measuring 42 mm. There is moderate dilatation of the ascending  aorta, measuring 43 mm.   Venous: The inferior vena cava is normal in size with greater than 50%  respiratory variability, suggesting right atrial pressure of 3 mmHg.   IAS/Shunts: The atrial septum is grossly normal.     LEFT VENTRICLE  PLAX 2D  LVIDd:         4.60 cm      Diastology  LVIDs:         3.40 cm      LV e' medial:    7.27 cm/s  LV PW:         1.00 cm      LV E/e' medial:  6.4  LV IVS:        0.90 cm      LV e' lateral:   11.20 cm/s  LVOT diam:     2.50 cm      LV E/e' lateral: 4.1  LV SV:         81  LV SV Index:   38  LVOT Area:     4.91 cm                                3D Volume EF:  LV Volumes (MOD)            3D EF:        51 %  LV vol d, MOD A2C: 143.0 ml LV EDV:       174 ml  LV vol d, MOD A4C: 143.0 ml LV ESV:       86  ml  LV vol s, MOD A2C: 64.9 ml  LV SV:        88 ml  LV vol s, MOD A4C: 67.7 ml  LV SV MOD A2C:     78.1 ml  LV SV MOD A4C:     143.0 ml  LV SV MOD BP:      78.2 ml   RIGHT VENTRICLE  RV S prime:     12.10 cm/s  TAPSE (M-mode): 2.1 cm   LEFT ATRIUM             Index        RIGHT ATRIUM           Index  LA diam:        3.90  cm 1.81 cm/m   RA Area:     14.50 cm  LA Vol (A2C):   68.4 ml 31.66 ml/m  RA Volume:   27.80 ml  12.87 ml/m  LA Vol (A4C):   54.4 ml 25.18 ml/m  LA Biplane Vol: 62.9 ml 29.11 ml/m   AORTIC VALVE  LVOT Vmax:   94.00 cm/s  LVOT Vmean:  62.400 cm/s  LVOT VTI:    0.166 m    AORTA  Ao Root diam: 4.20 cm  Ao Asc diam:  4.30 cm   MITRAL VALVE               TRICUSPID VALVE  MV Area (PHT): 4.39 cm    TR Peak grad:   13.2 mmHg  MV Decel Time: 173 msec    TR Vmax:        182.00 cm/s  MV E velocity: 46.40 cm/s  MV A velocity: 67.30 cm/s  SHUNTS  MV E/A ratio:  0.69        Systemic VTI:  0.17 m                             Systemic Diam: 2.50 cm   Jackquelyn Mass MD  Electronically signed by Jackquelyn Mass MD  Signature Date/Time: 12/25/2021/2:02:30 PM        Final     Impression/ Plan: Stable 4.5 cm thoracic aortic aneurysm.  An echocardiogram done in June 2023 showed a normal tricuspid aortic valve with no significant stenosis or regurgitation.  Careful blood pressure control and avoidance of strenuous activities were again discussed.  I also cautioned him regarding health risks related to smoking marijuana especially in light of his history of necrotizing pneumonia.  Follow-up in 1 year with MRA since he apparently had an allergic reaction to the contrast media used for the CTA.  Yuval Nolet G. Annlouise Gerety, PA-C [facility] [phone]

## 2023-12-17 ENCOUNTER — Encounter: Payer: Self-pay | Admitting: Physician Assistant

## 2023-12-17 ENCOUNTER — Ambulatory Visit: Attending: Surgery | Admitting: Physician Assistant

## 2023-12-17 VITALS — BP 116/77 | HR 87 | Resp 18 | Wt 191.0 lb

## 2023-12-17 DIAGNOSIS — I7121 Aneurysm of the ascending aorta, without rupture: Secondary | ICD-10-CM

## 2023-12-17 NOTE — Patient Instructions (Signed)
 Risk Modification in those with ascending thoracic aortic aneurysm:  Continue good control of blood pressure (prefer SBP 130/80 or less)  2. Avoid fluoroquinolone antibiotics (I.e Ciprofloxacin, Avelox, Levofloxacin, Ofloxacin)  3.  Avoid tobacco products and avoid smoking marijuana  4.  Exercise and activity limitations is individualized, but in general, contact sports are to be avoided and one should avoid heavy lifting (defined as half of ideal body weight) and exercises involving sustained Valsalva maneuver.  5.  Follow-up in 6 months with MRA chest

## 2023-12-30 ENCOUNTER — Other Ambulatory Visit: Payer: Self-pay | Admitting: Student

## 2023-12-30 ENCOUNTER — Other Ambulatory Visit (HOSPITAL_COMMUNITY): Payer: Self-pay

## 2023-12-30 MED ORDER — BUPRENORPHINE HCL-NALOXONE HCL 8-2 MG SL SUBL
1.0000 | SUBLINGUAL_TABLET | Freq: Three times a day (TID) | SUBLINGUAL | 0 refills | Status: DC
  Filled 2023-12-30: qty 90, 30d supply, fill #0

## 2023-12-30 NOTE — Progress Notes (Signed)
 Patient was not taking the Suboxone  while taking tramadol  after his hernia repair. Refill for suboxone  sent in.

## 2024-01-02 ENCOUNTER — Other Ambulatory Visit (HOSPITAL_COMMUNITY): Payer: Self-pay

## 2024-01-07 ENCOUNTER — Other Ambulatory Visit (HOSPITAL_COMMUNITY): Payer: Self-pay

## 2024-01-09 ENCOUNTER — Ambulatory Visit: Admitting: Family Medicine

## 2024-01-23 ENCOUNTER — Other Ambulatory Visit: Payer: Self-pay

## 2024-01-23 ENCOUNTER — Ambulatory Visit
Admission: EM | Admit: 2024-01-23 | Discharge: 2024-01-23 | Disposition: A | Discharge status: Home / Self Care | Attending: Physician Assistant | Admitting: Physician Assistant

## 2024-01-23 ENCOUNTER — Encounter: Payer: Self-pay | Admitting: *Deleted

## 2024-01-23 ENCOUNTER — Telehealth: Payer: Self-pay | Admitting: *Deleted

## 2024-01-23 DIAGNOSIS — M7989 Other specified soft tissue disorders: Secondary | ICD-10-CM

## 2024-01-23 DIAGNOSIS — T63441A Toxic effect of venom of bees, accidental (unintentional), initial encounter: Secondary | ICD-10-CM | POA: Diagnosis not present

## 2024-01-23 MED ORDER — DOXYCYCLINE HYCLATE 100 MG PO CAPS: 100.0000 mg | ORAL_CAPSULE | Freq: Two times a day (BID) | ORAL | 0 refills | Status: AC

## 2024-01-23 MED ORDER — METHYLPREDNISOLONE ACETATE 80 MG/ML IJ SUSP
80.0000 mg | Freq: Once | INTRAMUSCULAR | Status: AC
  Administered 2024-01-23: 80 mg via INTRAMUSCULAR

## 2024-01-23 NOTE — ED Triage Notes (Signed)
 Pt reports he was working in the yard and as stung through his work glove when pulling weeds. States it was a brown bee. Left hand is swollen. No respiratory difficulty. He took tylenol  last night

## 2024-01-23 NOTE — Telephone Encounter (Signed)
 Message received from pt access:  MRN: [id number] Joseph Ayala just called, stated his arm is getting worse if anyone can contact him, he wasn't sure what to do  I spoke to Joseph Ayala who states his hand is getting more swollen and he asked about getting a prescription for prednisone . He states he has taken his first dose of doxycycline . Discussed with provider Asberry Senters, PA-C. Per Asberry, advised pt to monitor his hand as the medications may need more time to take effect. Or he could go to the ED for further evaluation. He states he feels like it is an allergic reaction and will monitor.  If he has not improved by this evening then he will let us  know.

## 2024-01-23 NOTE — ED Provider Notes (Signed)
 [facility]    CSN: [id number] Arrival date & time: 01/23/24  0848      History   Chief Complaint Chief Complaint  Patient presents with   Insect Bite    HPI Chrsitopher T Ayala is a 58 y.o. male.   Patient presents to for evaluation of swelling to his left hand that started after he was stung by bee yesterday.  He reports that he looked down after he felt a burning sensation through his glove and noted a brown baby.  He denies any respiratory issues.  He did take Tylenol  last night.  The history is provided by the patient.    Past Medical History:  Diagnosis Date   Acute HFmrEF (heart failure with mildly reduced ejection fraction) (HCC) 10/31/2020   Acute hypoxemic respiratory failure (HCC)    Acute respiratory failure with hypoxia (HCC) 08/28/2020   CAP (community acquired pneumonia) 08/28/2020   Degenerative disc disease, lumbar    Dental crown present    ETOH abuse    History of kidney stones    Hypertension    states under control with meds., has been on med. x 1 yr. 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   MRSA pneumonia (HCC) 09/09/2020   Muscle atrophy 01/2018   Muscle weakness 01/2018   Necrotizing myopathy    Necrotizing pneumonia (HCC) 09/09/2020   Neuropathic pain    Opioid use disorder    PAF (paroxysmal atrial fibrillation) (HCC)    Pleural effusion    Sepsis (HCC) 08/28/2020   Sleep apnea 10/23/2022   Severe OSA during the diagnostic portion of the study  AHI = 41.8-hour   Thoracic aortic aneurysm (TAA) (HCC)    a. seen on CT 08/2020.    Patient Active Problem List   Diagnosis Date Noted   Left low back pain 10/31/2023   Acquired pes planus of left foot 04/08/2023   Left inguinal hernia 02/19/2023   Right inguinal hernia 02/19/2023   Coronary artery disease 05/21/2022    Pulmonary nodule 05/21/2022   McArdle's syndrome (glycogen storage disease type V) (HCC) 04/26/2022   Substance induced mood disorder (HCC) 01/01/2022   Hyperlipidemia 08/21/2021   Healthcare maintenance 05/15/2021   Erectile dysfunction 10/31/2020   Thoracic aortic aneurysm without rupture (HCC)    Hypertension 07/13/2019   ADHD 06/29/2019   Low testosterone  06/01/2019   Opioid use disorder 09/11/2018   Necrotizing myopathy 12/27/2017   Chronic pain syndrome 10/22/2017   Degeneration of lumbar intervertebral disc 09/25/2017   Lumbar spondylosis 09/25/2017    Past Surgical History:  Procedure Laterality Date   ENDOVENOUS ABLATION SAPHENOUS VEIN W/ LASER Right 12-29-2015   endovenous laser ablation right greater saphenous vein, stab phlebectomy > 20 incisions right leg, sclerotherapy right leg by Krystal Doing MD     INGUINAL HERNIA REPAIR Bilateral 11/21/2023   Procedure: REPAIR, HERNIA, INGUINAL, LAPAROSCOPIC;  Surgeon: Rubin Calamity, MD;  Location: [facility];  Service: General;  Laterality: Bilateral;  LAPAROSCOPIC BILATERAL INGUINAL HERNIA REPAIR WITH MESH   MINOR MUSCLE BIOPSY Right 02/11/2018   Procedure: RECTUS FEMORIS MUSCLE BIOPSY;  Surgeon: Eletha Krystal, MD;  Location: [facility];  Service: General;  Laterality: Right;   MUSCLE BIOPSY Right 02/11/2018   Procedure: DELTOID MUSCLE BIOPSY;  Surgeon: Eletha Krystal, MD;  Location: [facility];  Service: General;  Laterality: Right;   NASAL FRACTURE SURGERY     TRACHEOSTOMY TUBE PLACEMENT N/A 09/14/2020   Procedure: TRACHEOSTOMY;  Surgeon: Roark Rush, MD;  Location: [facility];  Service: ENT;  Laterality: N/A;   UMBILICAL HERNIA REPAIR N/A 11/21/2023   Procedure: REPAIR, HERNIA, UMBILICAL, LAPAROSCOPIC;  Surgeon: Rubin Calamity, MD;  Location: [facility];  Service: General;  Laterality: N/A;       Home Medications    Prior to Admission medications   Medication Sig Start Date End Date Taking? Authorizing Provider   acetaminophen  (TYLENOL ) 500 MG tablet Take 4,000 mg by mouth daily.   Yes [provider]  buprenorphine -naloxone  (SUBOXONE ) 8-2 mg SUBL SL tablet Place 1 tablet under the tongue in the morning, at noon, and at bedtime. 12/30/23  Yes Kandis Perkins, DO  chlorthalidone  (HYGROTON ) 25 MG tablet Take 1 tablet (25 mg total) by mouth daily. 04/08/23  Yes Jerrell Cleatus Ned, MD  doxycycline  (VIBRAMYCIN ) 100 MG capsule Take 1 capsule (100 mg total) by mouth 2 (two) times daily for 7 days. 01/23/24 01/30/24 Yes Billy Asberry FALCON, PA-C  gabapentin  (NEURONTIN ) 300 MG capsule Take 3 capsules (900 mg total) by mouth 3 (three) times daily for Neuropathy 08/16/23  Yes Jerrell Cleatus Ned, MD  ibuprofen  (ADVIL ) 200 MG tablet Take 800 mg by mouth daily.   Yes [provider]  metoprolol  succinate (TOPROL -XL) 50 MG 24 hr tablet Take 1 tablet (50 mg total) by mouth daily. Take with or immediately following a meal. 08/16/23  Yes Jerrell Cleatus Ned, MD  testosterone  cypionate (DEPOTESTOSTERONE CYPIONATE) 200 MG/ML injection Inject 2 mLs (400 mg total) into the muscle every 14 (fourteen) days. 09/25/23  Yes Jerrell Cleatus Ned, MD  diclofenac  Sodium (VOLTAREN ) 1 % GEL Apply 1 gram topically 4 (four) times a day. Patient taking differently: Apply 1 Application topically 4 (four) times daily as needed (pain.). 04/11/23     mupirocin  ointment (BACTROBAN ) 2 % Place 1 Application into the nose 3 (three) times daily for 7 days 11/14/23     predniSONE  (DELTASONE ) 50 MG tablet Take 1 tablet (50 mg total) by mouth 13 hours prior, 7 hours prior and 1 hour prior to CTA scan. 12/03/23   Norrine Sharper, MD  tadalafil  (CIALIS ) 10 MG tablet Take 1 tablet (10 mg total) by mouth as needed for erectile dysfunction. 08/16/23 08/15/24  Jerrell Cleatus Ned, MD  traMADol  (ULTRAM ) 50 MG tablet Take 1 tablet (50 mg total) by mouth every 6 (six) hours as needed. 11/21/23 11/20/24  Rubin Calamity, MD    Family History Family  History  Problem Relation Age of Onset   Varicose Veins Brother    Drug abuse Brother     Social History Social History   Tobacco Use   Smoking status: Never   Smokeless tobacco: Former  Building services engineer status: Never Used  Substance Use Topics   Alcohol  use: Not Currently    Comment: occasionally   Drug use: Yes    Types: Marijuana     Allergies   Ivp dye [iodinated contrast media], Lyrica  cr [pregabalin  er], Vicodin [hydrocodone-acetaminophen ], and Quinolones   Review of Systems Review of Systems  Constitutional:  Negative for chills and fever.  Eyes:  Negative for discharge and redness.  Respiratory:  Negative for shortness of breath.   Skin:  Positive for color change. Negative for wound.  Neurological:  Negative for numbness.     Physical Exam Triage Vital Signs ED Triage Vitals  Encounter Vitals Group     BP      Girls Systolic BP Percentile      Girls Diastolic BP Percentile      Boys Systolic BP Percentile      Boys Diastolic BP Percentile      Pulse      Resp      Temp      Temp src      SpO2      Weight      Height      Head Circumference      Peak Flow      Pain Score      Pain Loc      Pain Education      Exclude from Growth Chart    No data found.  Updated Vital Signs There were no vitals taken for this visit.  Visual Acuity Right Eye Distance:   Left Eye Distance:   Bilateral Distance:    Right Eye Near:   Left Eye Near:    Bilateral Near:     Physical Exam Vitals and nursing note reviewed.  Constitutional:      General: He is not in acute distress.    Appearance: Normal appearance. He is not ill-appearing.  HENT:     Head: Normocephalic and atraumatic.  Eyes:     Conjunctiva/sclera: Conjunctivae normal.  Cardiovascular:     Rate and Rhythm: Normal rate.  Pulmonary:     Effort: Pulmonary effort is normal. No respiratory distress.  Skin:    Findings: Erythema present.     Comments: Diffuse erythema and swelling to  left hand.  Neurological:     Mental Status: He is alert.  Psychiatric:        Mood and Affect: Mood normal.        Behavior: Behavior normal.        Thought Content: Thought content normal.      UC Treatments / Results  Labs (all labs ordered are listed, but only abnormal results are displayed) Labs Reviewed - No data to display  EKG   Radiology No results found.  Procedures Procedures (including critical care time)  Medications Ordered in UC Medications  methylPREDNISolone  acetate (DEPO-MEDROL ) injection 80 mg (80 mg Intramuscular Given 01/23/24 0918)    Initial Impression / Assessment and Plan / UC Course  I have reviewed the triage vital signs and the nursing notes.  Pertinent labs & imaging results that were available during my care of the patient were reviewed by me and considered in my medical decision making (see chart for details).    Suspect likely reaction to bee sting.  Will treat with steroid injection in office as well as antibiotics to cover possible secondary infection.  Advised Benadryl  if needed at home.  Discussed to follow-up should symptoms not gradually improve or with any worsening.  Final Clinical Impressions(s) / UC Diagnoses   Final diagnoses:  Bee sting, accidental or unintentional, initial encounter  Swelling of left hand   Discharge Instructions   None    ED Prescriptions     Medication Sig Dispense Auth. Provider   doxycycline  (VIBRAMYCIN ) 100 MG capsule Take 1 capsule (100 mg total) by mouth 2 (two) times daily for 7 days. 14 capsule Billy Asberry FALCON, PA-C      PDMP not reviewed this encounter.   Billy Asberry FALCON, PA-C 01/23/24 1452

## 2024-02-04 ENCOUNTER — Other Ambulatory Visit (HOSPITAL_COMMUNITY): Payer: Self-pay

## 2024-02-04 ENCOUNTER — Other Ambulatory Visit: Payer: Self-pay | Admitting: Student

## 2024-02-04 ENCOUNTER — Other Ambulatory Visit: Payer: Self-pay

## 2024-02-04 MED ORDER — BUPRENORPHINE HCL-NALOXONE HCL 8-2 MG SL SUBL
1.0000 | SUBLINGUAL_TABLET | Freq: Three times a day (TID) | SUBLINGUAL | 0 refills | Status: DC
  Filled 2024-02-04: qty 90, 30d supply, fill #0

## 2024-02-06 ENCOUNTER — Encounter: Admitting: Student

## 2024-02-06 NOTE — Assessment & Plan Note (Deleted)
 Patient presents with a history of opoid use disorder on suboxone  8-2 mg TID. Patient reports episodic constipation. They deny relapses or cravings. He reports use of Mariajuana daily and methamphetamine occasionally.  Plan: -Contract UTD -Toxassure ordered, prior toxassure was in December 2024 and had the presence of  Methamphetamine and THC  -Urine drug screen in March revealed: Ectasy, amphetamine, marijuana, and methadone 

## 2024-02-06 NOTE — Assessment & Plan Note (Deleted)
 Patient is on testosterone  therapy and has been since 2009, He is on a current regimen of 2mL (400mg ) of testosterone  cypionate  every 14 days.  CBC 2 months ago was stable.  BP stable. Plan: - Check testosterone . -PSA needs to be checked annually

## 2024-02-06 NOTE — Progress Notes (Deleted)
 CC: Follow-up on OUD.  HPI:  Joseph Ayala is a 58 y.o. male living with a history stated below and presents today for follow-up on OUD..   [facility], secondary to insect bite.  Was discharged with doxycycline .   Please see problem based assessment and plan for additional details.  Past Medical History:  Diagnosis Date   Acute HFmrEF (heart failure with mildly reduced ejection fraction) (HCC) 10/31/2020   Acute hypoxemic respiratory failure (HCC)    Acute respiratory failure with hypoxia (HCC) 08/28/2020   CAP (community acquired pneumonia) 08/28/2020   Degenerative disc disease, lumbar    Dental crown present    ETOH abuse    History of kidney stones    Hypertension    states under control with meds., has been on med. x 1 yr. 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   MRSA pneumonia (HCC) 09/09/2020   Muscle atrophy 01/2018   Muscle weakness 01/2018   Necrotizing myopathy    Necrotizing pneumonia (HCC) 09/09/2020   Neuropathic pain    Opioid use disorder    PAF (paroxysmal atrial fibrillation) (HCC)    Pleural effusion    Sepsis (HCC) 08/28/2020   Sleep apnea 10/23/2022   Severe OSA during the diagnostic portion of the study  AHI = 41.8-hour   Thoracic aortic aneurysm (TAA) (HCC)    a. seen on CT 08/2020.    Current Outpatient Medications on File Prior to Visit  Medication Sig Dispense Refill   acetaminophen  (TYLENOL ) 500 MG tablet Take 4,000 mg by mouth daily.     buprenorphine -naloxone  (SUBOXONE ) 8-2 mg SUBL SL tablet Place 1 tablet under the tongue in the morning, at noon, and at bedtime. Additional refills require office visit with PCP. 90 tablet 0   chlorthalidone  (HYGROTON ) 25 MG tablet Take 1 tablet (25 mg total) by mouth daily. 90 tablet 3   diclofenac   Sodium (VOLTAREN ) 1 % GEL Apply 1 gram topically 4 (four) times a day. (Patient taking differently: Apply 1 Application topically 4 (four) times daily as needed (pain.).) 200 g 3   gabapentin  (NEURONTIN ) 300 MG capsule Take 3 capsules (900 mg total) by mouth 3 (three) times daily for Neuropathy 270 capsule 3   ibuprofen  (ADVIL ) 200 MG tablet Take 800 mg by mouth daily.     metoprolol  succinate (TOPROL -XL) 50 MG 24 hr tablet Take 1 tablet (50 mg total) by mouth daily. Take with or immediately following a meal. 90 tablet 3   mupirocin  ointment (BACTROBAN ) 2 % Place 1 Application into the nose 3 (three) times daily for 7 days 22 g 0   predniSONE  (DELTASONE ) 50 MG tablet Take 1 tablet (50 mg total) by mouth 13 hours prior, 7 hours prior and 1 hour prior to CTA scan. 3 tablet 0   tadalafil  (CIALIS ) 10 MG tablet Take 1 tablet (10 mg total) by mouth as needed for erectile dysfunction. 20 tablet 1   testosterone  cypionate (DEPOTESTOSTERONE CYPIONATE) 200 MG/ML injection Inject 2 mLs (400 mg total) into the muscle every 14 (fourteen) days. 10 mL 1   No current facility-administered medications on file prior to visit.    Review of Systems: ROS negative except for what is noted on the assessment and plan.  There were no vitals filed for  this visit. {Labs (Optional):23779} {Vitals History (Optional):23777}  Physical Exam: Constitutional: NAD Cardiovascular: RRR, no murmurs. Pulmonary/Chest: Clear bilateral lungs Abdominal: soft, non-tender, non-distended.  Assessment & Plan:   Patient {GC/GE:[id number]::discussed with,seen with} Dr. {[id number]::Tpoopjfd,Z. Hoffman,Chambliss, Winfrey,Lau,Machen}  Assessment & Plan Low testosterone  Patient is on testosterone  therapy and has been since 2009, He is on a current regimen of 2mL (400mg ) of testosterone  cypionate  every 14 days.  CBC 2 months ago was stable.  BP stable. Plan: - Check testosterone . -PSA needs to be checked  annually  Opioid use disorder Patient presents with a history of opoid use disorder on suboxone  8-2 mg TID. Patient reports episodic constipation. They deny relapses or cravings. He reports use of Mariajuana daily and methamphetamine occasionally.  Plan: -Contract UTD -Toxassure ordered, prior toxassure was in December 2024 and had the presence of  Methamphetamine and THC  -Urine drug screen in March revealed: Ectasy, amphetamine, marijuana, and methadone   No orders of the defined types were placed in this encounter.   Missy Sandhoff, MD [facility] Internal Medicine, PGY-2  Date 02/06/2024 Time 11:13 AM

## 2024-02-07 ENCOUNTER — Encounter: Admitting: Student

## 2024-02-07 NOTE — Assessment & Plan Note (Deleted)
 Patient is on testosterone  therapy and has been since 2009, He is on a current regimen of 2mL (400mg ) of testosterone  cypionate  every 14 days.  CBC 2 months ago was stable.  BP stable. Plan: - Check testosterone . -PSA needs to be checked annually

## 2024-02-07 NOTE — Assessment & Plan Note (Deleted)
 Patient presents with a history of opoid use disorder on suboxone  8-2 mg TID. Patient reports episodic constipation. They deny relapses or cravings. He reports use of Mariajuana daily and methamphetamine occasionally.  Plan: -Contract UTD -Toxassure ordered, prior toxassure was in December 2024 and had the presence of  Methamphetamine and THC  -Urine drug screen in March revealed: Ectasy, amphetamine, marijuana, and methadone 

## 2024-02-07 NOTE — Progress Notes (Deleted)
 CC: Follow-up on OUD.  HPI:  Joseph Ayala is a 58 y.o. male living with a history stated below and presents today for follow-up on OUD..   [facility] for swelling, secondary to insect bite.  Was discharged with doxycycline .   Please see problem based assessment and plan for additional details.  Past Medical History:  Diagnosis Date   Acute HFmrEF (heart failure with mildly reduced ejection fraction) (HCC) 10/31/2020   Acute hypoxemic respiratory failure (HCC)    Acute respiratory failure with hypoxia (HCC) 08/28/2020   CAP (community acquired pneumonia) 08/28/2020   Degenerative disc disease, lumbar    Dental crown present    ETOH abuse    History of kidney stones    Hypertension    states under control with meds., has been on med. x 1 yr. 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   MRSA pneumonia (HCC) 09/09/2020   Muscle atrophy 01/2018   Muscle weakness 01/2018   Necrotizing myopathy    Necrotizing pneumonia (HCC) 09/09/2020   Neuropathic pain    Opioid use disorder    PAF (paroxysmal atrial fibrillation) (HCC)    Pleural effusion    Sepsis (HCC) 08/28/2020   Sleep apnea 10/23/2022   Severe OSA during the diagnostic portion of the study  AHI = 41.8-hour   Thoracic aortic aneurysm (TAA) (HCC)    a. seen on CT 08/2020.    Current Outpatient Medications on File Prior to Visit  Medication Sig Dispense Refill   acetaminophen  (TYLENOL ) 500 MG tablet Take 4,000 mg by mouth daily.     buprenorphine -naloxone  (SUBOXONE ) 8-2 mg SUBL SL tablet Place 1 tablet under the tongue in the morning, at noon, and at bedtime. Additional refills require office visit with PCP. 90 tablet 0   chlorthalidone  (HYGROTON ) 25 MG tablet Take 1 tablet (25 mg total) by mouth daily. 90 tablet 3   diclofenac   Sodium (VOLTAREN ) 1 % GEL Apply 1 gram topically 4 (four) times a day. (Patient taking differently: Apply 1 Application topically 4 (four) times daily as needed (pain.).) 200 g 3   gabapentin  (NEURONTIN ) 300 MG capsule Take 3 capsules (900 mg total) by mouth 3 (three) times daily for Neuropathy 270 capsule 3   ibuprofen  (ADVIL ) 200 MG tablet Take 800 mg by mouth daily.     metoprolol  succinate (TOPROL -XL) 50 MG 24 hr tablet Take 1 tablet (50 mg total) by mouth daily. Take with or immediately following a meal. 90 tablet 3   mupirocin  ointment (BACTROBAN ) 2 % Place 1 Application into the nose 3 (three) times daily for 7 days 22 g 0   predniSONE  (DELTASONE ) 50 MG tablet Take 1 tablet (50 mg total) by mouth 13 hours prior, 7 hours prior and 1 hour prior to CTA scan. 3 tablet 0   tadalafil  (CIALIS ) 10 MG tablet Take 1 tablet (10 mg total) by mouth as needed for erectile dysfunction. 20 tablet 1   testosterone  cypionate (DEPOTESTOSTERONE CYPIONATE) 200 MG/ML injection Inject 2 mLs (400 mg total) into the muscle every 14 (fourteen) days. 10 mL 1   No current facility-administered medications on file prior to visit.    Review of Systems: ROS negative except for what is noted on the assessment and plan.  There were no vitals filed for  this visit. {Labs (Optional):23779} {Vitals History (Optional):23777}  Physical Exam: Constitutional: NAD Cardiovascular: RRR, no murmurs. Pulmonary/Chest: Clear bilateral lungs Abdominal: soft, non-tender, non-distended.  Assessment & Plan:   Patient {GC/GE:[id number]::discussed with,seen with} Dr. {[id number]::Tpoopjfd,Z. Hoffman,Chambliss, Winfrey,Lau,Machen}  Assessment & Plan Opioid use disorder Patient presents with a history of opoid use disorder on suboxone  8-2 mg TID. Patient reports episodic constipation. They deny relapses or cravings. He reports use of Mariajuana daily and methamphetamine occasionally.  Plan: -Contract UTD -Toxassure  ordered, prior toxassure was in December 2024 and had the presence of  Methamphetamine and THC  -Urine drug screen in March revealed: Ectasy, amphetamine, marijuana, and methadone  Low testosterone  Patient is on testosterone  therapy and has been since 2009, He is on a current regimen of 2mL (400mg ) of testosterone  cypionate  every 14 days.  CBC 2 months ago was stable.  BP stable. Plan: - Check testosterone . -PSA needs to be checked annually   No orders of the defined types were placed in this encounter.   Missy Sandhoff, MD [facility] Internal Medicine, PGY-2  Date 02/07/2024 Time 8:20 AM

## 2024-02-10 ENCOUNTER — Other Ambulatory Visit: Payer: Self-pay

## 2024-02-13 ENCOUNTER — Telehealth: Payer: Self-pay | Admitting: *Deleted

## 2024-02-13 NOTE — Telephone Encounter (Signed)
 I contacted [employer] to request the chronic verification form fax over to [facility] office so I put it in the doctor box to be completed.

## 2024-02-13 NOTE — Telephone Encounter (Signed)
 Copied from CRM [id number]. Topic: General - Other >> Dec 20, 2023  1:40 PM Miquel SAILOR wrote: Vola from [facility] requesting diagnosis due to patient is enrolling for Chronic Specialist Plan Ref# [id number] Needs call back to [phone]

## 2024-02-17 ENCOUNTER — Other Ambulatory Visit (HOSPITAL_COMMUNITY): Payer: Self-pay

## 2024-02-18 ENCOUNTER — Other Ambulatory Visit (HOSPITAL_COMMUNITY): Payer: Self-pay

## 2024-02-24 ENCOUNTER — Encounter: Payer: Self-pay | Admitting: Student

## 2024-02-26 ENCOUNTER — Other Ambulatory Visit: Payer: Self-pay | Admitting: Student

## 2024-02-26 ENCOUNTER — Other Ambulatory Visit: Payer: Self-pay | Admitting: Student in an Organized Health Care Education/Training Program

## 2024-02-26 ENCOUNTER — Encounter (HOSPITAL_COMMUNITY): Payer: Self-pay

## 2024-02-26 ENCOUNTER — Other Ambulatory Visit (HOSPITAL_COMMUNITY): Payer: Self-pay

## 2024-02-26 DIAGNOSIS — R7989 Other specified abnormal findings of blood chemistry: Secondary | ICD-10-CM

## 2024-02-26 DIAGNOSIS — Z91041 Radiographic dye allergy status: Secondary | ICD-10-CM

## 2024-02-26 MED ORDER — BUPRENORPHINE HCL-NALOXONE HCL 8-2 MG SL SUBL
1.0000 | SUBLINGUAL_TABLET | Freq: Three times a day (TID) | SUBLINGUAL | 0 refills | Status: DC
  Filled 2024-03-17 (×2): qty 90, 30d supply, fill #0

## 2024-02-27 ENCOUNTER — Encounter: Payer: Self-pay | Admitting: Family Medicine

## 2024-02-27 ENCOUNTER — Other Ambulatory Visit (HOSPITAL_COMMUNITY): Payer: Self-pay

## 2024-02-27 ENCOUNTER — Encounter (HOSPITAL_COMMUNITY): Payer: Self-pay

## 2024-02-27 ENCOUNTER — Ambulatory Visit (INDEPENDENT_AMBULATORY_CARE_PROVIDER_SITE_OTHER): Admitting: Family Medicine

## 2024-02-27 VITALS — BP 122/80 | Ht 71.0 in | Wt 187.0 lb

## 2024-02-27 DIAGNOSIS — M19072 Primary osteoarthritis, left ankle and foot: Secondary | ICD-10-CM | POA: Diagnosis not present

## 2024-02-27 MED ORDER — AMBULATORY NON FORMULARY MEDICATION: 0 refills | Status: AC

## 2024-02-27 MED ORDER — PREDNISONE 10 MG PO TABS
ORAL_TABLET | ORAL | 0 refills | Status: AC
  Filled 2024-02-27: qty 21, 6d supply, fill #0

## 2024-02-27 NOTE — Telephone Encounter (Signed)
 RTC to patient states needs the Prednisone  for the swelling and Arthritis in his left foot.

## 2024-02-27 NOTE — Progress Notes (Signed)
 DATE OF VISIT: 02/27/2024        Joseph Ayala DOB: [birth year] MRN: [id number]  CC:  f/u left foot pain  History of present Illness: Joseph Ayala is a 58 y.o. male who presents for a follow-up visit for left foot pain Has known left midfoot osteoarthritis, last seen by me 11/14/2023 Previously had custom orthotics fabricated with metatarsal pad on the left foot These have been comfortable Has noted increasing left midfoot pain and swelling over the last week Denies any injury or trauma Continues to wear his orthotics Denies any new injury or trauma.  Medications:  Outpatient Encounter Medications as of 02/27/2024  Medication Sig   AMBULATORY NON FORMULARY MEDICATION Patient could benefit from a pair of custom orthotics   predniSONE  (DELTASONE ) 10 MG tablet Take 6 tablets (60 mg total) by mouth daily for 1 day, THEN 5 tablets (50 mg total) daily for 1 day, THEN 4 tablets (40 mg total) daily for 1 day, THEN 3 tablets (30 mg total) daily for 1 day, THEN 2 tablets (20 mg total) daily for 1 day, THEN 1 tablet (10 mg total) daily for 1 day.   acetaminophen  (TYLENOL ) 500 MG tablet Take 4,000 mg by mouth daily.   [START ON 03/05/2024] buprenorphine -naloxone  (SUBOXONE ) 8-2 mg SUBL SL tablet Place 1 tablet under the tongue in the morning, at noon, and at bedtime. Additional refills require office visit with PCP.   chlorthalidone  (HYGROTON ) 25 MG tablet Take 1 tablet (25 mg total) by mouth daily.   diclofenac  Sodium (VOLTAREN ) 1 % GEL Apply 1 gram topically 4 (four) times a day. (Patient taking differently: Apply 1 Application topically 4 (four) times daily as needed (pain.).)   gabapentin  (NEURONTIN ) 300 MG capsule Take 3 capsules (900 mg total) by mouth 3 (three) times daily for Neuropathy   ibuprofen  (ADVIL ) 200 MG tablet Take 800 mg by mouth daily.   metoprolol  succinate (TOPROL -XL) 50 MG 24 hr tablet Take 1 tablet (50 mg total) by mouth daily. Take with or immediately following a meal.   mupirocin   ointment (BACTROBAN ) 2 % Place 1 Application into the nose 3 (three) times daily for 7 days   tadalafil  (CIALIS ) 10 MG tablet Take 1 tablet (10 mg total) by mouth as needed for erectile dysfunction.   testosterone  cypionate (DEPOTESTOSTERONE CYPIONATE) 200 MG/ML injection Inject 2 mLs (400 mg total) into the muscle every 14 (fourteen) days.   [DISCONTINUED] predniSONE  (DELTASONE ) 50 MG tablet Take 1 tablet (50 mg total) by mouth 13 hours prior, 7 hours prior and 1 hour prior to CTA scan.   No facility-administered encounter medications on file as of 02/27/2024.    Allergies: is allergic to ivp dye [iodinated contrast media], lyrica  cr [pregabalin  er], vicodin [hydrocodone-acetaminophen ], and quinolones.  Physical Examination: Vitals: BP 122/80   Ht 5' 11 (1.803 m)   Wt 187 lb (84.8 kg)   BMI 26.08 kg/m  GENERAL:  Joseph Ayala is a 58 y.o. male appearing their stated age, alert and oriented x 3, in no apparent distress.  MSK: Foot: Left foot with mild swelling along the first TMT joint, no increased redness or warmth.  Mild tenderness in this area.  Also mild tenderness at the first MTP joint.  No increased redness or warmth.  Does have some flaking skin on the bottom of his foot.  No increased redness or warmth.  Does have some longitudinal arch collapse which was previously noted.  Noted to have hyperkeratotic nails Walking without a  limp Neurovascular intact distally  Assessment & Plan Osteoarthritis of midfoot, left Acute on chronic left midfoot pain with acute exacerbation since Sunday along the first TMT and MTP joint.  Known underlying osteoarthritis.  Suspect osteoarthritis flare  Plan: - Rx prednisone  taper to take as directed over the next 6 days - His custom orthotics are still in reasonable shape.  He should continue to use custom orthotic with metatarsal pad on the left.  He is potentially interested in a second pair.  He changed insurances, he will reach out to current  insurance provider to determine his orthotics benefits.  He will call to schedule at his convenience - He should continue to wear shoes with a wide toebox and adequate support - Follow-up if symptoms worsening or not improving   Patient expressed understanding & agreement with above.  Encounter Diagnosis  Name Primary?   Osteoarthritis of midfoot, left Yes    No orders of the defined types were placed in this encounter.

## 2024-03-03 ENCOUNTER — Encounter: Admitting: Family Medicine

## 2024-03-04 ENCOUNTER — Ambulatory Visit

## 2024-03-04 VITALS — BP 126/84 | Ht 71.0 in | Wt 187.0 lb

## 2024-03-04 DIAGNOSIS — M19072 Primary osteoarthritis, left ankle and foot: Secondary | ICD-10-CM

## 2024-03-04 DIAGNOSIS — M2142 Flat foot [pes planus] (acquired), left foot: Secondary | ICD-10-CM | POA: Diagnosis not present

## 2024-03-04 DIAGNOSIS — R269 Unspecified abnormalities of gait and mobility: Secondary | ICD-10-CM

## 2024-03-04 NOTE — Progress Notes (Addendum)
 PCP: Kandis Perkins, DO  Subjective:   HPI: Patient is a 58 y.o. male here for orthotics fitting.  Patient had orthotics made in December 2024.  He says they have been doing quite well but he feels like they are becoming a little more flimsy.  Patient has not had any significant injuries, traumas or changes to his bilateral feet.  He has known left foot arthritis.  Patient had custom orthotics with stable base applied to the orthotic blank.  Size 11.  Base was blue EVA foam.  With a left medial heel wedge.  Past Medical History:  Diagnosis Date   Acute HFmrEF (heart failure with mildly reduced ejection fraction) (HCC) 10/31/2020   Acute hypoxemic respiratory failure (HCC)    Acute respiratory failure with hypoxia (HCC) 08/28/2020   CAP (community acquired pneumonia) 08/28/2020   Degenerative disc disease, lumbar    Dental crown present    ETOH abuse    History of kidney stones    Hypertension    states under control with meds., has been on med. x 1 yr. 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   MRSA pneumonia (HCC) 09/09/2020   Muscle atrophy 01/2018   Muscle weakness 01/2018   Necrotizing myopathy    Necrotizing pneumonia (HCC) 09/09/2020   Neuropathic pain    Opioid use disorder    PAF (paroxysmal atrial fibrillation) (HCC)    Pleural effusion    Sepsis (HCC) 08/28/2020   Sleep apnea 10/23/2022   Severe OSA during the diagnostic portion of the study  AHI = 41.8-hour   Thoracic aortic aneurysm (TAA) (HCC)    a. seen on CT 08/2020.    Current Outpatient Medications on File Prior to Visit  Medication Sig Dispense Refill   acetaminophen  (TYLENOL ) 500 MG tablet Take 4,000 mg by mouth daily.     AMBULATORY NON FORMULARY MEDICATION Patient could benefit from a pair of custom orthotics 1 each 0   [START ON 03/05/2024]  buprenorphine -naloxone  (SUBOXONE ) 8-2 mg SUBL SL tablet Place 1 tablet under the tongue in the morning, at noon, and at bedtime. Additional refills require office visit with PCP. 90 tablet 0   chlorthalidone  (HYGROTON ) 25 MG tablet Take 1 tablet (25 mg total) by mouth daily. 90 tablet 3   diclofenac  Sodium (VOLTAREN ) 1 % GEL Apply 1 gram topically 4 (four) times a day. (Patient taking differently: Apply 1 Application topically 4 (four) times daily as needed (pain.).) 200 g 3   gabapentin  (NEURONTIN ) 300 MG capsule Take 3 capsules (900 mg total) by mouth 3 (three) times daily for Neuropathy 270 capsule 3   ibuprofen  (ADVIL ) 200 MG tablet Take 800 mg by mouth daily.     metoprolol  succinate (TOPROL -XL) 50 MG 24 hr tablet Take 1 tablet (50 mg total) by mouth daily. Take with or immediately following a meal. 90 tablet 3   mupirocin  ointment (BACTROBAN ) 2 % Place 1 Application into the nose 3 (three) times daily for 7 days 22 g 0   predniSONE  (DELTASONE ) 10 MG tablet Take 6 tablets (60 mg total) by mouth daily for 1 day, THEN 5 tablets (50 mg total) daily for 1 day, THEN 4 tablets (40 mg total) daily for 1 day, THEN 3 tablets (30 mg total) daily for 1 day, THEN 2 tablets (20 mg total) daily for 1 day,  THEN 1 tablet (10 mg total) daily for 1 day. 21 tablet 0   tadalafil  (CIALIS ) 10 MG tablet Take 1 tablet (10 mg total) by mouth as needed for erectile dysfunction. 20 tablet 1   testosterone  cypionate (DEPOTESTOSTERONE CYPIONATE) 200 MG/ML injection Inject 2 mLs (400 mg total) into the muscle every 14 (fourteen) days. 10 mL 1   No current facility-administered medications on file prior to visit.    BP 126/84   Ht 5' 11 (1.803 m)   Wt 187 lb (84.8 kg)   BMI 26.08 kg/m        Objective:   Physical Exam:  Gen: NAD, comfortable in exam room Bilateral feet Inspection: Bilateral Morton's toe present, bilateral hallux valgus, bilateral bunionette's, longitudinal arch collapse bilaterally, left worse  than right.  Transverse arch collapse bilaterally, left worse than right.  Tenderness to plantar palpation of the MTP joints on the left.  Bilateral fifth digits are internally rotated.  Current orthotics are in good condition, the forefoot softer part is more flimsy, but the base and plastic insert is still intact without any breakdown  Assessment/Plan:   Joseph Ayala is a 58 y.o. male who was seen today for the following: 1. Abnormality of gait (Primary) 2. Pes planus of left foot 3. Osteoarthritis of midfoot, left  -Custom orthotics created in the office today   ORTHOTICS PREP Patient was fitted for a : standard, cushioned, semi-rigid orthotic. The orthotic was heated, placed on the orthotic stand. The patient was positioned in subtalar neutral position and 10 degrees of ankle dorsiflexion in a weight bearing stance on the heated orthotic blank After completion of molding, a stable base was applied to the orthotic blank. The blank was ground to a stable position for weight bearing. Blank: size 11 Base:blue medium density EVA foam Posting: left medial heel wedge  Metatarsal pad placed on the left   Patient walked in the office today and he stated that the orthotics were quite comfortable.  His gait improved.  He felt confident walking around with these orthotics. -Can follow-up as needed for any adjustments  Follow-up/Education:   Return if symptoms worsen or fail to improve.   May return sooner as needed and encouraged to call/e-mail for additional questions or  worsening symptoms in the interim.  Krystal Lowing, DO Sports Medicine Fellow 03/04/2024 12:14 PM

## 2024-03-10 ENCOUNTER — Encounter: Admitting: Student

## 2024-03-17 ENCOUNTER — Other Ambulatory Visit (HOSPITAL_COMMUNITY): Payer: Self-pay

## 2024-03-17 ENCOUNTER — Other Ambulatory Visit: Payer: Self-pay

## 2024-03-17 ENCOUNTER — Encounter: Admitting: Student

## 2024-03-17 ENCOUNTER — Other Ambulatory Visit: Payer: Self-pay | Admitting: Student in an Organized Health Care Education/Training Program

## 2024-03-17 ENCOUNTER — Encounter (HOSPITAL_COMMUNITY): Payer: Self-pay

## 2024-03-17 DIAGNOSIS — R7989 Other specified abnormal findings of blood chemistry: Secondary | ICD-10-CM

## 2024-03-17 NOTE — Progress Notes (Deleted)
 Established Patient Office Visit  Subjective   Patient ID: Joseph Ayala, male    DOB: May 12, 1966  Age: 58 y.o. MRN: [id number]  No chief complaint on file.   Joseph Ayala is a 58 y.o. who presents to the clinic for ***. Please see problem based assessment and plan for additional details.  OUD: Patient presents with a history of opoid use disorder on suboxone  *** mg ***ID. Patient*** adverse effects including nausea, vomiting, constipation. They *** relapses or cravings.  Plan: -Contract UTD -Toxassure ordered, prior toxassure was *** and was ***  -Bowel regimen:    *** Patient is on testosterone  therapy and has been since 2009. He reports that his last dose was on Monday and that he sometimes forgets to  inject the testosterone  every two weeks. He is on a current regimen of 2mL (400mg ) of testosterone  cypionate  every 14 days.  Plan: -Testosterone  level was> 1500, will need levels check midway between injections (next Monday)  -PSA needs to be checked annually: Will order at next visit  -CBC was last checked in September which was WNL, repeat in September 2025 -BP is well controlled today    Patient presents with a history of hypertension with a blood pressure today of***. Their hypertension is***controlled on a regimen of***,***,***.  Prior BMP in***, Scr was***.  Patient***symptoms of hypotension at home.  They***check their blood pressure at home with an average of***. 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 BP Readings from Last 3 Encounters:  03/04/24 126/84  02/27/24 122/80  12/17/23 116/77   Wt Readings from Last 3 Encounters:  03/04/24 187 lb (84.8 kg)  02/27/24 187 lb (84.8 kg)  12/17/23 191 lb (86.6 kg)      Physical Exam   No results found for any visits on 03/17/24.  Last metabolic panel Lab Results  Component Value Date   GLUCOSE 88 11/12/2023   NA 137 11/12/2023   K 3.7 11/12/2023   CL 99 11/12/2023   CO2 29 11/12/2023   BUN 22 (H) 11/12/2023   CREATININE 0.69 11/12/2023   GFRNONAA >60 11/12/2023   CALCIUM  9.8 11/12/2023   PHOS 3.9 09/16/2020   PROT 6.8 04/08/2023   ALBUMIN  4.8 04/08/2023   LABGLOB 2.0 04/08/2023   AGRATIO 2.5 (H) 07/23/2022   BILITOT 0.3 04/08/2023   ALKPHOS 94 04/08/2023   AST 61 (H) 04/08/2023   ALT 61 (H) 04/08/2023   ANIONGAP 9 11/12/2023   Last lipids Lab Results  Component Value Date   CHOL 212 (H) 01/28/2023   HDL 41  01/28/2023   LDLCALC 131 (H) 01/28/2023   TRIG 224 (H) 01/28/2023   CHOLHDL 5.2 (H) 01/28/2023   Last hemoglobin A1c Lab Results  Component Value Date   HGBA1C 5.6 09/25/2022      The 10-year ASCVD risk score (Arnett DK, et al., 2019) is: 8.7%    Assessment & Plan:   Problem List Items Addressed This Visit   None   No follow-ups on file.    Damien Lease, DO

## 2024-03-20 ENCOUNTER — Other Ambulatory Visit: Payer: Self-pay | Admitting: Student

## 2024-03-20 ENCOUNTER — Other Ambulatory Visit (HOSPITAL_COMMUNITY): Payer: Self-pay

## 2024-03-20 ENCOUNTER — Ambulatory Visit: Admitting: Student

## 2024-03-20 ENCOUNTER — Other Ambulatory Visit: Payer: Self-pay | Admitting: Student in an Organized Health Care Education/Training Program

## 2024-03-20 VITALS — BP 125/86 | HR 76 | Temp 97.5°F | Ht 71.0 in | Wt 193.0 lb

## 2024-03-20 DIAGNOSIS — M47816 Spondylosis without myelopathy or radiculopathy, lumbar region: Secondary | ICD-10-CM

## 2024-03-20 DIAGNOSIS — R7989 Other specified abnormal findings of blood chemistry: Secondary | ICD-10-CM | POA: Diagnosis not present

## 2024-03-20 DIAGNOSIS — F1120 Opioid dependence, uncomplicated: Secondary | ICD-10-CM | POA: Diagnosis not present

## 2024-03-20 DIAGNOSIS — G894 Chronic pain syndrome: Secondary | ICD-10-CM

## 2024-03-20 DIAGNOSIS — Z79899 Other long term (current) drug therapy: Secondary | ICD-10-CM

## 2024-03-20 DIAGNOSIS — Z79890 Hormone replacement therapy (postmenopausal): Secondary | ICD-10-CM

## 2024-03-20 DIAGNOSIS — E7404 McArdle disease: Secondary | ICD-10-CM | POA: Diagnosis not present

## 2024-03-20 DIAGNOSIS — I10 Essential (primary) hypertension: Secondary | ICD-10-CM | POA: Diagnosis not present

## 2024-03-20 MED ORDER — METHOCARBAMOL 500 MG PO TABS
500.0000 mg | ORAL_TABLET | Freq: Three times a day (TID) | ORAL | 0 refills | Status: DC | PRN
  Filled 2024-03-20: qty 90, 30d supply, fill #0

## 2024-03-20 MED ORDER — TESTOSTERONE CYPIONATE 200 MG/ML IM SOLN
400.0000 mg | INTRAMUSCULAR | 0 refills | Status: DC
  Filled 2024-03-20 – 2024-03-24 (×2): qty 10, 70d supply, fill #0
  Filled 2024-03-25: qty 2, 14d supply, fill #0
  Filled 2024-04-07: qty 8, 56d supply, fill #1
  Filled ????-??-??: fill #1

## 2024-03-20 NOTE — Progress Notes (Unsigned)
 Established Patient Office Visit  Subjective   Patient ID: Joseph Ayala, male    DOB: [birth year]  Age: 58 y.o. MRN: [id number]  No chief complaint on file.   Joseph Ayala is a 58 y.o. who presents to the clinic for ***. Please see problem based assessment and plan for additional details.  OUD: Patient presents with a history of opoid use disorder on suboxone  8-2 mg TID. Patient denies adverse effects including nausea, vomiting, constipation. They denies relapses or cravings.   Plan: -Contract collected today -Toxassure ordered, prior toxassure was April 2025 and was as expected   *** Patient is on testosterone  therapy and has been since 2009. He reports that his last dose was on August 30th.  Plan: -Testosterone  level was> 1500, will need levels check midway between injections (next Monday)  -PSA needs to be checked annually: Ordered today  -CBC in May 2025 WNL -BP is well controlled today    Patient presents with a history of hypertension with a blood pressure today of***. Their hypertension is***controlled on a regimen of chlorothiadone 25 mg, metoprolol  50mg  daily.  Prior BMP in May 2025, Scr was 0.69.   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) 04/26/2022   Substance induced mood disorder (HCC) 01/01/2022   Hyperlipidemia 08/21/2021   Healthcare maintenance 05/15/2021   Erectile dysfunction 10/31/2020   Thoracic aortic aneurysm without rupture (HCC)    Hypertension 07/13/2019   ADHD 06/29/2019   Low testosterone  06/01/2019   Severe opioid use disorder on maintenance  therapy (HCC) 09/11/2018   Necrotizing myopathy 12/27/2017   Chronic pain syndrome 10/22/2017   Degeneration of lumbar intervertebral disc 09/25/2017   Lumbar spondylosis 09/25/2017       Objective:     BP 125/86 (BP Location: Right Arm, Patient Position: Sitting, Cuff Size: Normal)   Pulse 76   Temp (!) 97.5 F (36.4 C) (Oral)   Ht 5' 11 (1.803 m)   Wt 193 lb (87.5 kg)   SpO2 91%   BMI 26.92 kg/m  BP Readings from Last 3 Encounters:  03/20/24 125/86  03/04/24 126/84  02/27/24 122/80   Wt Readings from Last 3 Encounters:  03/20/24 193 lb (87.5 kg)  03/04/24 187 lb (84.8 kg)  02/27/24 187 lb (84.8 kg)      Physical Exam Vitals reviewed.  Constitutional:      General: He is not in acute distress.    Appearance: He is not ill-appearing, toxic-appearing or diaphoretic.  Cardiovascular:     Rate and Rhythm: Normal rate and regular rhythm.     Heart sounds: Normal heart sounds. No murmur heard. Pulmonary:     Effort: Pulmonary effort is normal. No respiratory distress.     Breath sounds: Normal breath sounds. No wheezing.  Musculoskeletal:     Right lower leg: No edema.     Left lower leg: No edema.     Comments: B/L lumbar tenderness present on exam, tight paraspinal muscles to palpation.   Skin:    General: Skin is warm and dry.  Neurological:  Mental Status: He is alert.  Psychiatric:        Mood and Affect: Mood normal.      No results found for any visits on 03/20/24.  Last metabolic panel Lab Results  Component Value Date   GLUCOSE 88 11/12/2023   NA 137 11/12/2023   K 3.7 11/12/2023   CL 99 11/12/2023   CO2 29 11/12/2023   BUN 22 (H) 11/12/2023   CREATININE 0.69 11/12/2023   GFRNONAA >60 11/12/2023   CALCIUM  9.8 11/12/2023   PHOS 3.9 09/16/2020   PROT 6.8 04/08/2023   ALBUMIN  4.8 04/08/2023   LABGLOB 2.0 04/08/2023   AGRATIO 2.5 (H) 07/23/2022   BILITOT 0.3 04/08/2023   ALKPHOS 94 04/08/2023   AST 61 (H) 04/08/2023   ALT 61 (H)  04/08/2023   ANIONGAP 9 11/12/2023   Last lipids Lab Results  Component Value Date   CHOL 212 (H) 01/28/2023   HDL 41 01/28/2023   LDLCALC 131 (H) 01/28/2023   TRIG 224 (H) 01/28/2023   CHOLHDL 5.2 (H) 01/28/2023   Last hemoglobin A1c Lab Results  Component Value Date   HGBA1C 5.6 09/25/2022      The 10-year ASCVD risk score (Arnett DK, et al., 2019) is: 8.6%    Assessment & Plan:   Problem List Items Addressed This Visit   None   No follow-ups on file.    Damien Lease, DO

## 2024-03-20 NOTE — Patient Instructions (Addendum)
 Thank you, [name] for allowing us  to provide your care today. Please be sure to come in ONE week after your next testosterone  injection. We need to have this lab value to appropriately treat your testosterone  deficiency.  If you do not return for the lab visit, then we can not safely prescribe the testosterone .   We have sent a neurology referral.    You may use robaxin  500 mg every 8 hours AS NEEDED for back muscle spasm.

## 2024-03-21 ENCOUNTER — Ambulatory Visit: Payer: Self-pay | Admitting: Student

## 2024-03-21 LAB — PSA: Prostate Specific Ag, Serum: 0.9 ng/mL (ref 0.0–4.0)

## 2024-03-21 NOTE — Assessment & Plan Note (Signed)
 Patient presents with a history of hypertension with a blood pressure today of 125/86. Their hypertension is controlled on a regimen of chlorothiadone 25 mg, metoprolol  50mg  daily.  Prior BMP in May 2025, Scr was 0.69.   Plan: -Continue current regimen of:  chlorothiadone 25 mg, metoprolol  50mg  daily

## 2024-03-21 NOTE — Assessment & Plan Note (Signed)
 Neurology referral sent. Patient understands importance of continuing care with Neurology.

## 2024-03-21 NOTE — Assessment & Plan Note (Signed)
 Patient presents with a history of opoid use disorder on suboxone  8-2 mg TID. Patient denies adverse effects including nausea, vomiting, constipation. They denies relapses or cravings.   Plan: -Contract collected today -Toxassure ordered, prior toxassure was April 2025 and was as expected

## 2024-03-21 NOTE — Assessment & Plan Note (Signed)
 Patient is on testosterone  therapy and has been since 2009. He reports that his last dose was on August 30th.  We discussed the need for testosterone  level in the midway between injections.  Patient has been asked to come in for labs last April and has yet to do this.  We discussed that if patient does not have his testosterone  checked at the appropriate time, we may not be able to continue prescribing his testosterone  therapy. Plan: -Last testosterone  level was> 1500, will need levels check midway between injections -PSA needs to be checked annually: Ordered today  -CBC in May 2025 WNL -BP is well controlled today

## 2024-03-21 NOTE — Assessment & Plan Note (Signed)
 Patient continues to have chronic back pain. Exam is notable for B/L paraspinal lumber muscle spasms. Patient reports using his wife's Robaxin  at home with improvement in sx.  Plan: -Robaxin  500 mg TID Prn

## 2024-03-24 ENCOUNTER — Other Ambulatory Visit (HOSPITAL_COMMUNITY): Payer: Self-pay

## 2024-03-24 LAB — TOXASSURE SELECT,+ANTIDEPR,UR

## 2024-03-24 NOTE — Progress Notes (Signed)
 Internal Medicine Clinic Attending  Case discussed with the resident at the time of the visit.  We reviewed the resident's history and exam and pertinent patient test results.  I agree with the assessment, diagnosis, and plan of care documented in the resident's note.

## 2024-03-25 ENCOUNTER — Other Ambulatory Visit: Payer: Self-pay | Admitting: Student

## 2024-03-25 ENCOUNTER — Other Ambulatory Visit (HOSPITAL_COMMUNITY): Payer: Self-pay

## 2024-04-01 ENCOUNTER — Other Ambulatory Visit

## 2024-04-01 ENCOUNTER — Other Ambulatory Visit (HOSPITAL_COMMUNITY): Payer: Self-pay

## 2024-04-07 ENCOUNTER — Other Ambulatory Visit (HOSPITAL_COMMUNITY): Payer: Self-pay

## 2024-04-08 DIAGNOSIS — S50861A Insect bite (nonvenomous) of right forearm, initial encounter: Secondary | ICD-10-CM | POA: Diagnosis not present

## 2024-04-10 ENCOUNTER — Other Ambulatory Visit (HOSPITAL_COMMUNITY): Payer: Self-pay

## 2024-04-29 ENCOUNTER — Other Ambulatory Visit: Payer: Self-pay | Admitting: Student in an Organized Health Care Education/Training Program

## 2024-04-29 ENCOUNTER — Other Ambulatory Visit: Payer: Self-pay | Admitting: Student

## 2024-04-29 ENCOUNTER — Other Ambulatory Visit (HOSPITAL_COMMUNITY): Payer: Self-pay

## 2024-04-29 ENCOUNTER — Telehealth: Payer: Self-pay

## 2024-04-29 ENCOUNTER — Other Ambulatory Visit: Payer: Self-pay

## 2024-04-29 DIAGNOSIS — I10 Essential (primary) hypertension: Secondary | ICD-10-CM

## 2024-04-29 NOTE — Telephone Encounter (Signed)
 Copied from CRM [id number]. Topic: Clinical - Prescription Issue >> Apr 29, 2024  5:21 PM DeAngela L wrote: Reason for CRM: patient call about a medication refill that would have been requested by the pharmacy checking the status   buprenorphine -naloxone  (SUBOXONE ) 8-2 mg SUBL SL tablet methocarbamol  (ROBAXIN ) 500 MG tablet chlorthalidone  (HYGROTON ) 25 MG tablet  [facility] [address] [place] [address] Phone: [phone] Fax: [phone]  Pt num [phone] BENNIE)

## 2024-04-29 NOTE — Telephone Encounter (Signed)
 Requested Prescriptions   Pending Prescriptions Disp Refills   chlorthalidone  (HYGROTON ) 25 MG tablet 90 tablet 3    Sig: Take 1 tablet (25 mg total) by mouth daily.     Date of patient request: 04/29/2024 Last office visit: 09/25/2023 Upcoming visit: Visit date not found ( 05/06/2024 appointment with internal med)  Date of last refill: 04/08/2023 Last refill amount: 90 tab 3 refills

## 2024-04-30 ENCOUNTER — Other Ambulatory Visit (HOSPITAL_COMMUNITY): Payer: Self-pay

## 2024-04-30 ENCOUNTER — Other Ambulatory Visit: Payer: Self-pay

## 2024-04-30 MED ORDER — BUPRENORPHINE HCL-NALOXONE HCL 8-2 MG SL SUBL
1.0000 | SUBLINGUAL_TABLET | Freq: Three times a day (TID) | SUBLINGUAL | 0 refills | Status: DC
  Filled 2024-04-30 (×2): qty 90, 30d supply, fill #0

## 2024-04-30 MED ORDER — METHOCARBAMOL 500 MG PO TABS
500.0000 mg | ORAL_TABLET | Freq: Three times a day (TID) | ORAL | 0 refills | Status: DC | PRN
  Filled 2024-04-30: qty 90, 30d supply, fill #0

## 2024-04-30 MED ORDER — CHLORTHALIDONE 25 MG PO TABS
25.0000 mg | ORAL_TABLET | Freq: Every day | ORAL | 3 refills | Status: AC
  Filled 2024-04-30: qty 90, 90d supply, fill #0
  Filled 2024-07-28: qty 90, 90d supply, fill #1

## 2024-04-30 NOTE — Telephone Encounter (Signed)
 Pt was called / informed rxs were filled and sent to the pharmacy this morning.

## 2024-05-06 ENCOUNTER — Other Ambulatory Visit (INDEPENDENT_AMBULATORY_CARE_PROVIDER_SITE_OTHER)

## 2024-05-06 ENCOUNTER — Other Ambulatory Visit: Payer: Self-pay | Admitting: Internal Medicine

## 2024-05-06 DIAGNOSIS — R7989 Other specified abnormal findings of blood chemistry: Secondary | ICD-10-CM | POA: Diagnosis not present

## 2024-05-06 DIAGNOSIS — Z79899 Other long term (current) drug therapy: Secondary | ICD-10-CM

## 2024-05-06 DIAGNOSIS — G7289 Other specified myopathies: Secondary | ICD-10-CM

## 2024-05-07 LAB — CK: Total CK: 1024 U/L (ref 41–331)

## 2024-05-07 LAB — TESTOSTERONE: Testosterone: >1500 ng/dL — ABNORMAL HIGH (ref 264–916)

## 2024-05-14 ENCOUNTER — Other Ambulatory Visit: Payer: Self-pay | Admitting: Student

## 2024-05-14 ENCOUNTER — Ambulatory Visit: Payer: Self-pay | Admitting: Student

## 2024-05-14 DIAGNOSIS — R7989 Other specified abnormal findings of blood chemistry: Secondary | ICD-10-CM

## 2024-05-18 ENCOUNTER — Other Ambulatory Visit: Payer: Self-pay | Admitting: Surgery

## 2024-05-18 ENCOUNTER — Ambulatory Visit: Payer: Self-pay | Admitting: Internal Medicine

## 2024-05-18 DIAGNOSIS — I7121 Aneurysm of the ascending aorta, without rupture: Secondary | ICD-10-CM

## 2024-05-18 NOTE — Progress Notes (Signed)
 Chronic elevation in this man with known myopathy, currently asymptomatic. No action needed.

## 2024-05-29 ENCOUNTER — Other Ambulatory Visit (HOSPITAL_COMMUNITY): Payer: Self-pay

## 2024-05-29 ENCOUNTER — Ambulatory Visit
Admission: RE | Admit: 2024-05-29 | Discharge: 2024-05-29 | Disposition: A | Discharge status: Home / Self Care | Source: Ambulatory Visit | Attending: Surgery | Admitting: Surgery

## 2024-05-29 ENCOUNTER — Other Ambulatory Visit: Payer: Self-pay | Admitting: Student

## 2024-05-29 ENCOUNTER — Other Ambulatory Visit: Payer: Self-pay

## 2024-05-29 DIAGNOSIS — I7121 Aneurysm of the ascending aorta, without rupture: Secondary | ICD-10-CM

## 2024-05-29 MED ORDER — GADOPICLENOL 0.5 MMOL/ML IV SOLN
7.0000 mL | Freq: Once | INTRAVENOUS | Status: AC | PRN
Start: 2024-05-29 — End: 2024-05-29
  Administered 2024-05-29: 7 mL via INTRAVENOUS

## 2024-05-29 NOTE — Telephone Encounter (Signed)
 Last rx written - 04/30/24. Last OV - 03/20/24. TOX - 03/20/24.

## 2024-06-01 ENCOUNTER — Other Ambulatory Visit (HOSPITAL_COMMUNITY): Payer: Self-pay

## 2024-06-01 MED ORDER — METHOCARBAMOL 500 MG PO TABS
500.0000 mg | ORAL_TABLET | Freq: Three times a day (TID) | ORAL | 0 refills | Status: DC | PRN
  Filled 2024-06-01 (×2): qty 90, 30d supply, fill #0

## 2024-06-01 MED ORDER — BUPRENORPHINE HCL-NALOXONE HCL 8-2 MG SL SUBL
1.0000 | SUBLINGUAL_TABLET | Freq: Three times a day (TID) | SUBLINGUAL | 0 refills | Status: DC
  Filled 2024-06-01: qty 90, 30d supply, fill #0

## 2024-06-05 ENCOUNTER — Other Ambulatory Visit (HOSPITAL_COMMUNITY): Payer: Self-pay

## 2024-06-11 ENCOUNTER — Other Ambulatory Visit: Payer: Self-pay | Admitting: Student

## 2024-06-11 ENCOUNTER — Other Ambulatory Visit (HOSPITAL_COMMUNITY): Payer: Self-pay

## 2024-06-11 DIAGNOSIS — R7989 Other specified abnormal findings of blood chemistry: Secondary | ICD-10-CM

## 2024-06-12 ENCOUNTER — Other Ambulatory Visit (HOSPITAL_COMMUNITY): Payer: Self-pay

## 2024-06-15 ENCOUNTER — Other Ambulatory Visit (HOSPITAL_COMMUNITY): Payer: Self-pay

## 2024-06-15 ENCOUNTER — Other Ambulatory Visit: Payer: Self-pay | Admitting: Student

## 2024-06-15 DIAGNOSIS — R7989 Other specified abnormal findings of blood chemistry: Secondary | ICD-10-CM

## 2024-06-16 NOTE — Telephone Encounter (Signed)
 I called pt - no answer. No vm, unable to leave a message. My Chart message sent also.

## 2024-06-17 ENCOUNTER — Ambulatory Visit

## 2024-06-17 ENCOUNTER — Other Ambulatory Visit: Payer: Self-pay | Admitting: Student

## 2024-06-17 ENCOUNTER — Other Ambulatory Visit (HOSPITAL_COMMUNITY): Payer: Self-pay

## 2024-06-17 DIAGNOSIS — R7989 Other specified abnormal findings of blood chemistry: Secondary | ICD-10-CM

## 2024-06-17 DIAGNOSIS — I7121 Aneurysm of the ascending aorta, without rupture: Secondary | ICD-10-CM

## 2024-06-17 MED ORDER — TESTOSTERONE CYPIONATE 200 MG/ML IM SOLN
400.0000 mg | INTRAMUSCULAR | 0 refills | Status: AC
  Filled 2024-06-17: qty 10, 70d supply, fill #0

## 2024-06-17 NOTE — Progress Notes (Signed)
 [address] [place] [address]             [phone]       CARDIOTHORACIC SURGERY TELEPHONE VIRTUAL OFFICE NOTE  Referring Provider is Jerrell Cleatus Ned,* Primary Cardiologist is None PCP is Kandis Perkins, DO   HPI:  I spoke with Joseph Ayala (DOB September 27, 1965 ) via telephone on 06/17/2024 at 9:20 AM and verified that I was speaking with the correct person using more than one form of identification.  We discussed the fact that I was contacting them from my office in [place], [place] and they were located at home in [place], as well as the reason(s) for conducting our visit virtually instead of in-person.  The patient expressed understanding the circumstances and agreed to proceed as described.   Joseph Ayala is a 58 year old man with medical history of hypertension, CAD, necrotizing myopathy, McArdle's syndrome, and hyperlipidemia who presents for continued follow up of ascending thoracic aortic aneurysm.  He has been followed by our clinic since 2022 and aneurysm has measured from 4.3 cm -4.7 cm.  On recent MRA of chest aneurysm measured 4.3 cm.  Echocardiogram from 12/2021 showed a tricuspid aortic valve.  He states that he is doing well.  His blood pressure is well controlled with current medication therapy.  On review of his chart office readings are in 110s-120s/80s.  He does smoke marijuana for his chronic pain and this is not everyday.  He denies heavy lifting.     Current Outpatient Medications  Medication Sig Dispense Refill   acetaminophen  (TYLENOL ) 500 MG tablet Take 4,000 mg by mouth daily.     AMBULATORY NON FORMULARY MEDICATION Patient could benefit from a pair of custom orthotics 1 each 0   buprenorphine -naloxone  (SUBOXONE ) 8-2 mg SUBL SL tablet Place 1 tablet under the tongue in the morning, at noon, and at bedtime. 90 tablet 0   chlorthalidone  (HYGROTON ) 25 MG tablet Take 1 tablet (25 mg total) by mouth daily. 90 tablet 3    diclofenac  Sodium (VOLTAREN ) 1 % GEL Apply 1 gram topically 4 (four) times a day. (Patient taking differently: Apply 1 Application topically 4 (four) times daily as needed (pain.).) 200 g 3   gabapentin  (NEURONTIN ) 300 MG capsule Take 3 capsules (900 mg total) by mouth 3 (three) times daily for Neuropathy 270 capsule 3   ibuprofen  (ADVIL ) 200 MG tablet Take 800 mg by mouth daily.     methocarbamol  (ROBAXIN ) 500 MG tablet Take 1 tablet (500 mg total) by mouth every 8 (eight) hours as needed for muscle spasms. 90 tablet 0   metoprolol  succinate (TOPROL -XL) 50 MG 24 hr tablet Take 1 tablet (50 mg total) by mouth daily. Take with or immediately following a meal. 90 tablet 3   mupirocin  ointment (BACTROBAN ) 2 % Place 1 Application into the nose 3 (three) times daily for 7 days 22 g 0   tadalafil  (CIALIS ) 10 MG tablet Take 1 tablet (10 mg total) by mouth as needed for erectile dysfunction. 20 tablet 1   testosterone  cypionate (DEPOTESTOSTERONE CYPIONATE) 200 MG/ML injection Inject 2 mLs (400 mg total) into the muscle every 14 (fourteen) days. 10 mL 0   No current facility-administered medications for this visit.     Diagnostic Tests:  EXAM: MRA of the chest without and with intravenous contrast 05/29/2024 09:17:55 AM   TECHNIQUE: Multiplanar multisequence MR images were obtained through the chest without and  with intravenous contrast. 7 ml of Vueway  IV contrast was administered.   COMPARISON: CTA 12/04/2023.   CLINICAL HISTORY: Aortic aneurysm suspected.   FINDINGS:   MEDIASTINUM: Heart and pericardium are unremarkable. The central airways are clear. AORTA: Ascending thoracic aorta 4.3 cm in transverse diameter. Aortic arch measures 3.7 cm. Proximal descending aorta measures 3.1 cm tapering 2 cm above the diaphragm with mild tortuosity. No evidence of dissection. 3-vessel brachiocephalic arterial origin anatomy without proximal stenosis.   LYMPH NODES: No mediastinal, hilar or  axillary lymphadenopathy.   LUNGS AND PLEURA: No focal consolidation or pulmonary edema. No pleural effusion or pneumothorax.   SOFT TISSUES/BONES: No acute abnormality of the bones or soft tissues.   IMPRESSION: 1. Ascending thoracic aorta aneurysm 4.3 cm, without complicating features. According to the ACR 2013 and SVS 2018 guidelines, the recommendation is yearly surveillance imaging.   Electronically signed by: Dayne Hassell MD 05/29/2024 11:36 AM EST RP Workstation: [id number]      Plan: Aneurysm of ascending aorta without rupture -4.3 cm ascending thoracic aortic aneurysm on MRA of chest. Echocardiogram from 2023 showed that his aortic valve is tricuspid.  -We discussed the natural history and and risk factors for growth of ascending aortic aneurysms. Discussed recommendations to minimize the risk of further expansion or dissection including careful blood pressure control, avoidance of contact sports and heavy lifting, attention to lipid management.  We covered the importance of staying never user of tobacco. Discussed the risks of continued use of smoking marijuana. The patient does not yet meet surgical criteria of >5.5cm. The patient is aware of signs and symptoms of aortic dissection and when to present to the emergency department   -Follow up in one year with MRA of chest for continued aneurysm surveillance     I discussed limitations of evaluation and management via telephone.  The patient was advised to call back for repeat telephone consultation or to seek an in-person evaluation if questions arise or the patient's clinical condition changes in any significant manner.  I spent in excess of 20 minutes of non-face-to-face time during the conduct of this telephone virtual office consultation, including pre-visit review of the patient's records and direct conversation with the patient.   Level 1  (99441)             5-10 minutes Level 2  (99442)            11-20  minutes Level 3  (99443)            21-30 minutes   Manuelita CHRISTELLA Rough, PA-C 06/17/2024 9:20 AM

## 2024-06-17 NOTE — Patient Instructions (Signed)
 Risk Modification in those with ascending thoracic aortic aneurysm:   Continue control of blood pressure (prefer BP 130/80 or less)   2. Avoid fluoroquinolone antibiotics (I.e Ciprofloxacin, Avelox, Levofloxacin, Ofloxacin)   3.  Use of statin (to decrease cardiovascular risk)   4.  Exercise and activity limitations is individualized, but in general, contact sports are to be avoided and one should avoid heavy lifting (defined as half of ideal body weight) and exercises involving sustained Valsalva maneuver.   5.  Follow-up in one year with MRA of chest.  OK to use a non-contrast CT if you have had a recent study for surveillance of the lung nodule.

## 2024-06-30 ENCOUNTER — Other Ambulatory Visit: Payer: Self-pay

## 2024-06-30 ENCOUNTER — Other Ambulatory Visit (HOSPITAL_COMMUNITY): Payer: Self-pay

## 2024-06-30 ENCOUNTER — Encounter (HOSPITAL_COMMUNITY): Payer: Self-pay

## 2024-06-30 ENCOUNTER — Other Ambulatory Visit: Payer: Self-pay | Admitting: Student in an Organized Health Care Education/Training Program

## 2024-06-30 ENCOUNTER — Other Ambulatory Visit: Payer: Self-pay | Admitting: Student

## 2024-06-30 MED ORDER — BUPRENORPHINE HCL-NALOXONE HCL 8-2 MG SL SUBL
1.0000 | SUBLINGUAL_TABLET | Freq: Three times a day (TID) | SUBLINGUAL | 0 refills | Status: DC
  Filled 2024-06-30: qty 90, 30d supply, fill #0

## 2024-06-30 MED ORDER — METHOCARBAMOL 500 MG PO TABS
500.0000 mg | ORAL_TABLET | Freq: Three times a day (TID) | ORAL | 0 refills | Status: DC | PRN
  Filled 2024-06-30: qty 90, 30d supply, fill #0

## 2024-07-01 ENCOUNTER — Other Ambulatory Visit: Payer: Self-pay

## 2024-07-01 ENCOUNTER — Other Ambulatory Visit (HOSPITAL_COMMUNITY): Payer: Self-pay

## 2024-07-15 ENCOUNTER — Other Ambulatory Visit (HOSPITAL_COMMUNITY): Payer: Self-pay

## 2024-07-28 ENCOUNTER — Other Ambulatory Visit: Payer: Self-pay | Admitting: Student

## 2024-07-28 ENCOUNTER — Other Ambulatory Visit (HOSPITAL_COMMUNITY): Payer: Self-pay

## 2024-07-28 MED ORDER — GABAPENTIN 300 MG PO CAPS
900.0000 mg | ORAL_CAPSULE | Freq: Three times a day (TID) | ORAL | 1 refills | Status: AC
  Filled 2024-07-28: qty 270, 30d supply, fill #0

## 2024-07-28 MED ORDER — METHOCARBAMOL 500 MG PO TABS
500.0000 mg | ORAL_TABLET | Freq: Three times a day (TID) | ORAL | 0 refills | Status: DC | PRN
  Filled 2024-07-28: qty 90, 30d supply, fill #0

## 2024-07-28 MED ORDER — BUPRENORPHINE HCL-NALOXONE HCL 8-2 MG SL SUBL
1.0000 | SUBLINGUAL_TABLET | Freq: Three times a day (TID) | SUBLINGUAL | 0 refills | Status: AC
  Filled 2024-07-31: qty 90, 30d supply, fill #0

## 2024-07-30 ENCOUNTER — Other Ambulatory Visit (HOSPITAL_COMMUNITY): Payer: Self-pay

## 2024-07-31 ENCOUNTER — Other Ambulatory Visit (HOSPITAL_COMMUNITY): Payer: Self-pay

## 2024-08-10 ENCOUNTER — Other Ambulatory Visit: Payer: Self-pay

## 2024-08-10 ENCOUNTER — Other Ambulatory Visit (HOSPITAL_COMMUNITY): Payer: Self-pay

## 2024-08-12 ENCOUNTER — Ambulatory Visit: Payer: Self-pay

## 2024-08-14 ENCOUNTER — Other Ambulatory Visit (HOSPITAL_COMMUNITY): Payer: Self-pay

## 2024-08-14 ENCOUNTER — Ambulatory Visit: Admitting: Student

## 2024-08-14 ENCOUNTER — Encounter: Payer: Self-pay | Admitting: Student

## 2024-08-14 VITALS — BP 118/79 | HR 80 | Temp 98.0°F | Ht 71.0 in | Wt 190.2 lb

## 2024-08-14 DIAGNOSIS — I10 Essential (primary) hypertension: Secondary | ICD-10-CM

## 2024-08-14 DIAGNOSIS — E7404 McArdle disease: Secondary | ICD-10-CM

## 2024-08-14 DIAGNOSIS — F1120 Opioid dependence, uncomplicated: Secondary | ICD-10-CM

## 2024-08-14 DIAGNOSIS — R7989 Other specified abnormal findings of blood chemistry: Secondary | ICD-10-CM

## 2024-08-14 DIAGNOSIS — I7300 Raynaud's syndrome without gangrene: Secondary | ICD-10-CM | POA: Insufficient documentation

## 2024-08-14 MED ORDER — CHLORTHALIDONE 25 MG PO TABS
12.5000 mg | ORAL_TABLET | Freq: Every day | ORAL | 0 refills | Status: AC
  Filled 2024-08-14: qty 45, 90d supply, fill #0

## 2024-08-14 MED ORDER — AMLODIPINE BESYLATE 5 MG PO TABS
5.0000 mg | ORAL_TABLET | Freq: Every day | ORAL | 11 refills | Status: AC
  Filled 2024-08-14: qty 30, 30d supply, fill #0

## 2024-08-14 MED ORDER — CYCLOBENZAPRINE HCL 10 MG PO TABS
10.0000 mg | ORAL_TABLET | Freq: Three times a day (TID) | ORAL | 1 refills | Status: AC | PRN
  Filled 2024-08-14: qty 30, 10d supply, fill #0

## 2024-08-14 MED ORDER — CYCLOBENZAPRINE HCL 7.5 MG PO TABS
7.5000 mg | ORAL_TABLET | Freq: Three times a day (TID) | ORAL | 1 refills | Status: DC | PRN
  Filled 2024-08-14: qty 30, 10d supply, fill #0

## 2024-08-14 NOTE — Patient Instructions (Signed)
 Thank you, [name] for allowing us  to provide your care today. Today we discussed:  Blood pressure: We have started you on amlodipine  5mg , continue metoprolol  50 mg, decrease chlorthalidone  to 12.5 mg daily   Raynaud's of the hands: Take amlodipine  5 mg daily .  Testosterone  use: Urology referral sent, we will provide you with testosterone  until you have a urology appointment. Please make this appointment soon. Return 7 DAYS AFTER your last injection.   I ordered a neurology referral for you too.    I have ordered the following labs for you:   Lab Orders         Sed Rate (ESR)         CRP (C-Reactive Protein)         TSH         ANA, IFA (with reflex)         Comprehensive metabolic panel with GFR         Urinalysis, Reflex Microscopic       Referrals ordered today:    Referral Orders         Ambulatory referral to Urology         Ambulatory referral to Neurology      I have ordered the following medication/changed the following medications:   Stop the following medications: Medications Discontinued During This Encounter  Medication Reason   methocarbamol  (ROBAXIN ) 500 MG tablet    chlorthalidone  (HYGROTON ) 25 MG tablet      Start the following medications: Meds ordered this encounter  Medications   amLODipine  (NORVASC ) 5 MG tablet    Sig: Take 1 tablet (5 mg total) by mouth daily.    Dispense:  30 tablet    Refill:  11   Chlorthalidone  12.5 MG TABS    Sig: Take 12.5 mg by mouth daily.    Dispense:  90 tablet    Refill:  0     Follow up: 1 week for lab and BP check, 3 months with provider   Remember:   Should you have any questions or concerns please call the internal medicine clinic at [phone].     Please note that our late policy has changed.  If you are more than 15 minutes late to your appointment, you may be asked to reschedule your appointment.  Dr. Kandis, D.O. [facility]

## 2024-08-14 NOTE — Progress Notes (Signed)
 Internal Medicine Clinic Attending  Case discussed with the resident at the time of the visit.  We reviewed the resident's history and exam and pertinent patient test results.  I agree with the assessment, diagnosis, and plan of care documented in the resident's note.

## 2024-08-14 NOTE — Progress Notes (Signed)
 "  Established Patient Office Visit  Subjective   Patient ID: Joseph Ayala Kidney, male    DOB: July 31, 1965  Age: 59 y.o. MRN: [id number]  No chief complaint on file.  Joseph Ayala is a 59 y.o. who presents to the clinic for HTN, Raynaud's, OUD on suboxone , Testosterone  use, and McArlde's use. Please see problem based assessment and plan for additional details.    Patient Active Problem List   Diagnosis Date Noted   Raynaud disease without gangrene 08/14/2024   Left low back pain 10/31/2023   Acquired pes planus of left foot 04/08/2023   Right inguinal hernia 02/19/2023   Coronary artery disease 05/21/2022   Pulmonary nodule 05/21/2022   McArdle's syndrome (glycogen storage disease type V) (HCC) 04/26/2022   Substance induced mood disorder (HCC) 01/01/2022   Hyperlipidemia 08/21/2021   Healthcare maintenance 05/15/2021   Erectile dysfunction 10/31/2020   Thoracic aortic aneurysm without rupture    Hypertension 07/13/2019   ADHD 06/29/2019   Low testosterone  06/01/2019   Severe opioid use disorder on maintenance therapy (HCC) 09/11/2018   Necrotizing myopathy 12/27/2017   Chronic pain syndrome 10/22/2017   Degeneration of lumbar intervertebral disc 09/25/2017   Lumbar spondylosis 09/25/2017      Objective:     BP 118/79 (BP Location: Right Arm, Patient Position: Sitting, Cuff Size: Normal)   Pulse 80   Temp 98 F (36.7 C) (Oral)   Ht 5' 11 (1.803 m)   Wt 190 lb 3.2 oz (86.3 kg)   SpO2 99%   BMI 26.53 kg/m  BP Readings from Last 3 Encounters:  08/14/24 118/79  03/20/24 125/86  03/04/24 126/84   Wt Readings from Last 3 Encounters:  08/14/24 190 lb 3.2 oz (86.3 kg)  03/20/24 193 lb (87.5 kg)  03/04/24 187 lb (84.8 kg)      Physical Exam Vitals reviewed.  Constitutional:      General: He is not in acute distress.    Appearance: Normal appearance. He is not ill-appearing, toxic-appearing or diaphoretic.  Cardiovascular:     Rate and Rhythm: Normal rate and  regular rhythm.     Heart sounds: No murmur heard. Pulmonary:     Effort: Pulmonary effort is normal.     Breath sounds: Normal breath sounds.  Musculoskeletal:     Comments: Coolness and discoloration (white) of the B/L fingers. Radial pulse present and symmetrical. No ulcers appreciated on the fingers or hands   Skin:    General: Skin is warm and dry.  Neurological:     Mental Status: He is alert.     Last metabolic panel Lab Results  Component Value Date   GLUCOSE 88 11/12/2023   NA 137 11/12/2023   K 3.7 11/12/2023   CL 99 11/12/2023   CO2 29 11/12/2023   BUN 22 (H) 11/12/2023   CREATININE 0.69 11/12/2023   GFRNONAA >60 11/12/2023   CALCIUM  9.8 11/12/2023   PHOS 3.9 09/16/2020   PROT 6.8 04/08/2023   ALBUMIN  4.8 04/08/2023   LABGLOB 2.0 04/08/2023   AGRATIO 2.5 (H) 07/23/2022   BILITOT 0.3 04/08/2023   ALKPHOS 94 04/08/2023   AST 61 (H) 04/08/2023   ALT 61 (H) 04/08/2023   ANIONGAP 9 11/12/2023   Last lipids Lab Results  Component Value Date   CHOL 212 (H) 01/28/2023   HDL 41 01/28/2023   LDLCALC 131 (H) 01/28/2023   TRIG 224 (H) 01/28/2023   CHOLHDL 5.2 (H) 01/28/2023   Last hemoglobin A1c Lab Results  Component Value Date   HGBA1C 5.6 09/25/2022      The 10-year ASCVD risk score (Arnett DK, et al., 2019) is: 8.4%    Assessment & Plan:   Problem List Items Addressed This Visit       Cardiovascular and Mediastinum   Hypertension - Primary (Chronic)   Patient presents with a history of hypertension with a blood pressure today of 118/79. Their hypertension is controlled on a regimen of Toprolol XL 50 mg & chlorthalidone  25 mg.  Prior BMP in May 2025, Scr was 0.69.  Plan: -Continue Toprolol XL 50 mg, will decrease chlorthalidone  to 12.5 mg and add amlodipine  5 mg due to concurrent Raynaud's disease -CMP today  -BP check in one week with RN      Relevant Medications   amLODipine  (NORVASC ) 5 MG tablet   chlorthalidone  (HYGROTON ) 25 MG tablet    Raynaud disease without gangrene   Patient presents with a history of blanching of his fingers followed by redness with associated pain and coolness of the fingers. Per patient, this started after he was in the ICU on vasopressors years prior. He denies dysphagia or HB sx.   On exam, there is color change of his fingers to pale/ white with coolness to touch. Radial pulses are present.  Concern for raynaud's disease. Given his age of symptom onset, will evaluate for secondary causes of Raynaud's disease.   Plan: -Amlodipine  5 mg daily -CMP, ANA, U/A, ESR, CRP, TSH ordered -Advised to avoid smoking, patient does not smoke.       Relevant Medications   amLODipine  (NORVASC ) 5 MG tablet   chlorthalidone  (HYGROTON ) 25 MG tablet     Musculoskeletal and Integument   McArdle's syndrome (glycogen storage disease type V) (HCC) (Chronic)   Neurology referral sent.       Relevant Orders   Ambulatory referral to Neurology     Other   Low testosterone  (Chronic)   Patient is on chronic testosterone  therapy, 400 mg every 2 weeks.  Prior discussion with the patient he was asked to follow-up with a urologist for further management of his low testosterone .  Patient has yet to follow-up with urologist.  His last injection was 17 days ago, patient does not use testosterone  injections correctly.  He reported that he will miss numerous injections and then take 3 in a week.  Last injection: 17 days ago Last CBC: Hgb 16.3 in 11/2023 Last PSA: 0.9 in 03/2024 Last testosterone : >1500  Plan: - Urology referral sent -Testosterone  lab ordered for 1 week after last injection, he reported that he will take an injection today and come in next week for follow-up lab -We discussed that he needs to follow-up with a urologist and that we will prescribe his testosterone  until he follows up with urologist, I recommend that he follows up with urologist soon and that we will likely no longer be prescribing his  testosterone  after the end of March.  Patient understood.  - Reviewed importance of following instructions on proper medication use, discouraged using testosterone  3 times in 1 week.       Relevant Orders   Ambulatory referral to Urology   Severe opioid use disorder on maintenance therapy [facility])   Patient presents with a history of opoid use disorder on suboxone  8-2 mg TID. Patient denies adverse effects including nausea, vomiting, constipation. They deny relapses or cravings.  Plan: -Contract UTD -Prior toxassure was in September and was as expected, next one due in 3 months  Other Visit Diagnoses       Raynaud's disease without gangrene       Relevant Medications   amLODipine  (NORVASC ) 5 MG tablet   chlorthalidone  (HYGROTON ) 25 MG tablet   Other Relevant Orders   Sed Rate (ESR)   CRP (C-Reactive Protein)   TSH   ANA, IFA (with reflex)   Comprehensive metabolic panel with GFR   Urinalysis, Reflex Microscopic       Return in about 3 months (around 11/11/2024) for BP.    Damien Lease, DO  "

## 2024-08-14 NOTE — Assessment & Plan Note (Signed)
 Patient presents with a history of opoid use disorder on suboxone  8-2 mg TID. Patient denies adverse effects including nausea, vomiting, constipation. They deny relapses or cravings.  Plan: -Contract UTD -Prior toxassure was in September and was as expected, next one due in 3 months

## 2024-08-14 NOTE — Assessment & Plan Note (Signed)
 Patient presents with a history of hypertension with a blood pressure today of 118/79. Their hypertension is controlled on a regimen of Toprolol XL 50 mg & chlorthalidone  25 mg.  Prior BMP in May 2025, Scr was 0.69.  Plan: -Continue Toprolol XL 50 mg, will decrease chlorthalidone  to 12.5 mg and add amlodipine  5 mg due to concurrent Raynaud's disease -CMP today  -BP check in one week with RN

## 2024-08-14 NOTE — Assessment & Plan Note (Signed)
 Neurology referral sent.

## 2024-08-14 NOTE — Assessment & Plan Note (Signed)
 Patient presents with a history of blanching of his fingers followed by redness with associated pain and coolness of the fingers. Per patient, this started after he was in the ICU on vasopressors years prior. He denies dysphagia or HB sx.   On exam, there is color change of his fingers to pale/ white with coolness to touch. Radial pulses are present.  Concern for raynaud's disease. Given his age of symptom onset, will evaluate for secondary causes of Raynaud's disease.   Plan: -Amlodipine  5 mg daily -CMP, ANA, U/A, ESR, CRP, TSH ordered -Advised to avoid smoking, patient does not smoke.

## 2024-08-14 NOTE — Assessment & Plan Note (Addendum)
 Patient is on chronic testosterone  therapy, 400 mg every 2 weeks.  Prior discussion with the patient he was asked to follow-up with a urologist for further management of his low testosterone .  Patient has yet to follow-up with urologist.  His last injection was 17 days ago, patient does not use testosterone  injections correctly.  He reported that he will miss numerous injections and then take 3 in a week.  Last injection: 17 days ago Last CBC: Hgb 16.3 in 11/2023 Last PSA: 0.9 in 03/2024 Last testosterone : >1500  Plan: - Urology referral sent -Testosterone  lab ordered for 1 week after last injection, he reported that he will take an injection today and come in next week for follow-up lab -We discussed that he needs to follow-up with a urologist and that we will prescribe his testosterone  until he follows up with urologist, I recommend that he follows up with urologist soon and that we will likely no longer be prescribing his testosterone  after the end of March.  Patient understood.  - Reviewed importance of following instructions on proper medication use, discouraged using testosterone  3 times in 1 week.

## 2024-08-21 ENCOUNTER — Other Ambulatory Visit: Payer: Self-pay

## 2024-09-02 ENCOUNTER — Ambulatory Visit: Payer: Medicare HMO
